# Patient Record
Sex: Female | Born: 1950 | Race: White | Hispanic: No | Smoking: Former smoker
Health system: Southern US, Community
[De-identification: ages and names within clinical notes are randomized; demographics above are authoritative.]

## PROBLEM LIST (undated history)

## (undated) DIAGNOSIS — I10 Essential (primary) hypertension: Secondary | ICD-10-CM

## (undated) DIAGNOSIS — E119 Type 2 diabetes mellitus without complications: Secondary | ICD-10-CM

## (undated) DIAGNOSIS — I4819 Other persistent atrial fibrillation: Secondary | ICD-10-CM

## (undated) DIAGNOSIS — E785 Hyperlipidemia, unspecified: Secondary | ICD-10-CM

## (undated) DIAGNOSIS — I2510 Atherosclerotic heart disease of native coronary artery without angina pectoris: Secondary | ICD-10-CM

## (undated) DIAGNOSIS — I779 Disorder of arteries and arterioles, unspecified: Secondary | ICD-10-CM

## (undated) DIAGNOSIS — I639 Cerebral infarction, unspecified: Secondary | ICD-10-CM

## (undated) DIAGNOSIS — I5020 Unspecified systolic (congestive) heart failure: Secondary | ICD-10-CM

## (undated) DIAGNOSIS — I739 Peripheral vascular disease, unspecified: Secondary | ICD-10-CM

## (undated) DIAGNOSIS — N1832 Chronic kidney disease, stage 3b: Secondary | ICD-10-CM

## (undated) DIAGNOSIS — I255 Ischemic cardiomyopathy: Secondary | ICD-10-CM

## (undated) DIAGNOSIS — D649 Anemia, unspecified: Secondary | ICD-10-CM

## (undated) HISTORY — DX: Atherosclerotic heart disease of native coronary artery without angina pectoris: I25.10

## (undated) HISTORY — DX: Anemia, unspecified: D64.9

## (undated) HISTORY — DX: Ischemic cardiomyopathy: I25.5

## (undated) HISTORY — PX: CAROTID ENDARTERECTOMY: SUR193

## (undated) HISTORY — PX: OTHER SURGICAL HISTORY: SHX169

## (undated) HISTORY — DX: Unspecified systolic (congestive) heart failure: I50.20

## (undated) HISTORY — DX: Hyperlipidemia, unspecified: E78.5

## (undated) HISTORY — PX: TONSILLECTOMY: SUR1361

## (undated) HISTORY — PX: CHOLECYSTECTOMY: SHX55

## (undated) HISTORY — DX: Essential (primary) hypertension: I10

## (undated) HISTORY — PX: LEG SURGERY: SHX1003

## (undated) HISTORY — DX: Other persistent atrial fibrillation: I48.19

## (undated) HISTORY — DX: Type 2 diabetes mellitus without complications: E11.9

## (undated) HISTORY — DX: Cerebral infarction, unspecified: I63.9

---

## 2010-12-13 ENCOUNTER — Inpatient Hospital Stay: Payer: Self-pay | Admitting: Podiatry

## 2010-12-30 ENCOUNTER — Ambulatory Visit: Payer: Self-pay | Admitting: Podiatry

## 2011-02-21 ENCOUNTER — Ambulatory Visit: Payer: Self-pay | Admitting: Family Medicine

## 2011-03-11 ENCOUNTER — Ambulatory Visit: Payer: Self-pay | Admitting: Family Medicine

## 2011-04-11 DIAGNOSIS — I639 Cerebral infarction, unspecified: Secondary | ICD-10-CM

## 2011-04-11 HISTORY — DX: Cerebral infarction, unspecified: I63.9

## 2011-04-14 ENCOUNTER — Ambulatory Visit: Payer: Self-pay | Admitting: Family Medicine

## 2011-05-12 ENCOUNTER — Ambulatory Visit: Payer: Self-pay | Admitting: Family Medicine

## 2012-02-13 ENCOUNTER — Inpatient Hospital Stay: Payer: Self-pay | Admitting: Internal Medicine

## 2012-02-13 DIAGNOSIS — I059 Rheumatic mitral valve disease, unspecified: Secondary | ICD-10-CM

## 2012-02-13 LAB — URINALYSIS, COMPLETE
Bilirubin,UR: NEGATIVE
Blood: NEGATIVE
Glucose,UR: >=500 mg/dL (ref 0–75)
Hyaline Cast: 1
Ketone: NEGATIVE
Nitrite: NEGATIVE
Ph: 5 (ref 4.5–8.0)
Specific Gravity: 1.021 (ref 1.003–1.030)
Squamous Epithelial: 10

## 2012-02-13 LAB — COMPREHENSIVE METABOLIC PANEL
Anion Gap: 9 (ref 7–16)
BUN: 20 mg/dL — ABNORMAL HIGH (ref 7–18)
Calcium, Total: 9.7 mg/dL (ref 8.5–10.1)
Chloride: 104 mmol/L (ref 98–107)
Co2: 26 mmol/L (ref 21–32)
Creatinine: 0.83 mg/dL (ref 0.60–1.30)
EGFR (African American): >60
Osmolality: 291 (ref 275–301)
Potassium: 3.8 mmol/L (ref 3.5–5.1)
SGOT(AST): 34 U/L (ref 15–37)
SGPT (ALT): 40 U/L (ref 12–78)
Total Protein: 9.8 g/dL — ABNORMAL HIGH (ref 6.4–8.2)

## 2012-02-13 LAB — CBC
HGB: 14.9 g/dL (ref 12.0–16.0)
MCH: 30.8 pg (ref 26.0–34.0)
MCHC: 33.8 g/dL (ref 32.0–36.0)
MCV: 91 fL (ref 80–100)
Platelet: 260 10*3/uL (ref 150–440)

## 2012-02-13 LAB — APTT: Activated PTT: 29.6 secs (ref 23.6–35.9)

## 2012-02-13 LAB — PROTIME-INR
INR: 0.9
Prothrombin Time: 12.9 secs (ref 11.5–14.7)

## 2012-02-14 LAB — CBC WITH DIFFERENTIAL/PLATELET
Basophil %: 1 %
Eosinophil #: 0.4 10*3/uL (ref 0.0–0.7)
Eosinophil %: 4.4 %
HGB: 12.8 g/dL (ref 12.0–16.0)
Lymphocyte %: 17.2 %
MCH: 30.7 pg (ref 26.0–34.0)
Monocyte #: 0.9 x10 3/mm (ref 0.2–0.9)
Monocyte %: 9.7 %
Neutrophil %: 67.7 %
RBC: 4.16 10*6/uL (ref 3.80–5.20)

## 2012-02-14 LAB — LIPID PANEL
HDL Cholesterol: 31 mg/dL — ABNORMAL LOW (ref 40–60)
Ldl Cholesterol, Calc: 100 mg/dL (ref 0–100)
Triglycerides: 168 mg/dL (ref 0–200)
VLDL Cholesterol, Calc: 34 mg/dL (ref 5–40)

## 2012-02-14 LAB — BASIC METABOLIC PANEL
Calcium, Total: 9 mg/dL (ref 8.5–10.1)
Chloride: 106 mmol/L (ref 98–107)
Co2: 25 mmol/L (ref 21–32)
EGFR (African American): >60
EGFR (Non-African Amer.): >60
Glucose: 119 mg/dL — ABNORMAL HIGH (ref 65–99)
Osmolality: 285 (ref 275–301)
Potassium: 4 mmol/L (ref 3.5–5.1)
Sodium: 141 mmol/L (ref 136–145)

## 2012-02-15 DIAGNOSIS — R9389 Abnormal findings on diagnostic imaging of other specified body structures: Secondary | ICD-10-CM

## 2012-02-15 DIAGNOSIS — R9431 Abnormal electrocardiogram [ECG] [EKG]: Secondary | ICD-10-CM

## 2012-02-15 LAB — HEMOGLOBIN A1C: Hemoglobin A1C: 7.2 % — ABNORMAL HIGH (ref 4.2–6.3)

## 2012-02-16 LAB — URINE CULTURE

## 2012-02-28 ENCOUNTER — Ambulatory Visit: Payer: Self-pay | Admitting: Vascular Surgery

## 2012-02-28 ENCOUNTER — Encounter: Payer: Self-pay | Admitting: *Deleted

## 2012-02-28 LAB — CBC
MCV: 90 fL (ref 80–100)
Platelet: 292 10*3/uL (ref 150–440)
RBC: 4.45 10*6/uL (ref 3.80–5.20)
RDW: 13.4 % (ref 11.5–14.5)
WBC: 11.4 10*3/uL — ABNORMAL HIGH (ref 3.6–11.0)

## 2012-02-28 LAB — BASIC METABOLIC PANEL
BUN: 25 mg/dL — ABNORMAL HIGH (ref 7–18)
Calcium, Total: 9.2 mg/dL (ref 8.5–10.1)
Creatinine: 0.87 mg/dL (ref 0.60–1.30)
EGFR (African American): >60
EGFR (Non-African Amer.): >60
Glucose: 86 mg/dL (ref 65–99)
Potassium: 4.4 mmol/L (ref 3.5–5.1)
Sodium: 135 mmol/L — ABNORMAL LOW (ref 136–145)

## 2012-02-29 ENCOUNTER — Encounter: Payer: Self-pay | Admitting: *Deleted

## 2012-03-01 ENCOUNTER — Encounter: Payer: Self-pay | Admitting: Cardiovascular Disease

## 2012-03-01 ENCOUNTER — Ambulatory Visit (INDEPENDENT_AMBULATORY_CARE_PROVIDER_SITE_OTHER): Payer: Self-pay | Admitting: Cardiovascular Disease

## 2012-03-01 VITALS — BP 140/90 | HR 80 | Ht 68.0 in | Wt 231.5 lb

## 2012-03-01 DIAGNOSIS — R0600 Dyspnea, unspecified: Secondary | ICD-10-CM | POA: Insufficient documentation

## 2012-03-01 DIAGNOSIS — R0609 Other forms of dyspnea: Secondary | ICD-10-CM

## 2012-03-01 DIAGNOSIS — Z01810 Encounter for preprocedural cardiovascular examination: Secondary | ICD-10-CM

## 2012-03-01 DIAGNOSIS — I10 Essential (primary) hypertension: Secondary | ICD-10-CM

## 2012-03-01 NOTE — Patient Instructions (Addendum)
Your physician has requested that you have a lexiscan myoview. For further information please visit [email or web address]. Please follow instruction sheet, as given.   

## 2012-03-01 NOTE — Progress Notes (Addendum)
HPI  This is a 61 year old female who was referred by Dr. Lucky Cowboy for preoperative cardiovascular evaluation before left carotid endarterectomy which is scheduled next week on Wednesday. I saw the patient recently at [facility]. She presented early this month with acute ischemic stroke greater than 3 days in duration affecting the left frontal lobe. Further evaluation revealed 75% stenosis in the left internal carotid artery. She had an echocardiogram done which showed mildly decreased LV systolic function with an ejection fraction of 45% with severe hypokinesis of the distal anterior wall and apex without LV thrombus. The patient is not aware of any previous cardiac history. She has known history of type 2 diabetes, hypertension and hyperlipidemia. She reports no chest pain. She has noted increased exertional dyspnea lately especially after her stroke. She still has mild residual right-sided weakness but seems to be improving overall.  No Known Allergies   Current Outpatient Prescriptions on File Prior to Visit  Medication Sig Dispense Refill  . glipiZIDE (GLUCOTROL) 5 MG tablet Take 5 mg by mouth 2 (two) times daily.      Marland Kitchen lisinopril (PRINIVIL,ZESTRIL) 10 MG tablet Take 10 mg by mouth daily.      . metFORMIN (GLUCOPHAGE) 1000 MG tablet Take 1,000 mg by mouth 2 (two) times daily.      . pravastatin (PRAVACHOL) 20 MG tablet Take 20 mg by mouth daily.         Past Medical History  Diagnosis Date  . Hypertension   . Diabetes mellitus without complication     Type II  . Hyperlipidemia   . Stroke 2013     Past Surgical History  Procedure Date  . Leg surgery     right;infection     Family History  Problem Relation Age of Onset  . Heart disease Mother      History   Social History  . Marital Status: Widowed    Spouse Name: N/A    Number of Children: N/A  . Years of Education: N/A   Occupational History  . Not on file.   Social History Main Topics  . Smoking status: Former  Smoker -- 0.5 packs/day for 5 years    Types: Cigarettes  . Smokeless tobacco: Not on file  . Alcohol Use: No  . Drug Use: No  . Sexually Active:    Other Topics Concern  . Not on file   Social History Narrative  . No narrative on file     PHYSICAL EXAM   BP 140/90  Pulse 80  Ht 5\' 8"  (1.727 m)  Wt 231 lb 8 oz (105.008 kg)  BMI 35.20 kg/m2 Constitutional: She is oriented to person, place, and time. She appears well-developed and well-nourished. No distress.  HENT: No nasal discharge.  Head: Normocephalic and atraumatic.  Eyes: Pupils are equal and round. Right eye exhibits no discharge. Left eye exhibits no discharge.  Neck: Normal range of motion. Neck supple. No JVD present. No thyromegaly present. Left carotid bruit. Cardiovascular: Normal rate, regular rhythm, normal heart sounds. Exam reveals no gallop and no friction rub. No murmur heard.  Pulmonary/Chest: Effort normal and breath sounds normal. No stridor. No respiratory distress. She has no wheezes. She has no rales. She exhibits no tenderness.  Abdominal: Soft. Bowel sounds are normal. She exhibits no distension. There is no tenderness. There is no rebound and no guarding.  Musculoskeletal: Normal range of motion. She exhibits no edema and no tenderness.  Neurological: She is alert and oriented to  person, place, and time. Coordination normal.  Skin: Skin is warm and dry. No rash noted. She is not diaphoretic. No erythema. No pallor.  Psychiatric: She has a normal mood and affect. Her behavior is normal. Judgment and thought content normal.     EKG: Sinus  Rhythm  Old septal infarct.  ABNORMAL    ASSESSMENT AND PLAN

## 2012-03-01 NOTE — Assessment & Plan Note (Addendum)
The patient is scheduled for left carotid endarterectomy next week. Her recent echocardiogram during her hospitalization was suggestive of a prior anteroapical infarct with mildly reduced LV systolic function. Current symptoms include dyspnea without chest pain. The possibilities include old LAD infarct versus stress-induced cardiomyopathy. I recommend proceeding with a pharmacologic nuclear stress test for further evaluation. The patient is not able to exercise on a treadmill due to her recent stroke. In the meantime, continue current medications. I will consider adding a small dose beta blocker. Further recommendations to follow after stress test which will be scheduled on Monday. The patient overall does not seem to have convincing evidence of angina. I suspect that most likely she will be able to proceed with the surgery at an overall low to moderate risk.  Addendum 03/04/2012: Myocardial perfusion imaging confirmed the presence of prior anteroseptal, anterior and apical infarct with only mild peri-infarct ischemia and ejection fraction of 41%.  Given that the patient's presentation was that of a stroke, I think the priority is to treat her left carotid stenosis and then address the cardiac issues. She should be considered at an overall moderate risk for cardiovascular complications. She reports no significant cardiac symptoms. I will start her on metoprolol 25 mg twice daily. She will require cardiac catheterization in the near future.

## 2012-03-04 ENCOUNTER — Telehealth: Payer: Self-pay

## 2012-03-04 ENCOUNTER — Ambulatory Visit: Payer: Self-pay | Admitting: Cardiovascular Disease

## 2012-03-04 DIAGNOSIS — R079 Chest pain, unspecified: Secondary | ICD-10-CM

## 2012-03-04 MED ORDER — METOPROLOL TARTRATE 25 MG PO TABS: 25.0000 mg | ORAL_TABLET | Freq: Two times a day (BID) | ORAL | Status: DC

## 2012-03-04 NOTE — Telephone Encounter (Signed)
Pt informed Understanding verb Letter faxed to surg Pt will pick up new RX

## 2012-03-04 NOTE — Telephone Encounter (Signed)
Message copied by Minus Liberty on Mon Mar 04, 2012  2:17 PM ------      Message from: Kathlyn Sacramento A      Created: Mon Mar 04, 2012  2:10 PM      Regarding: stress test       Her stress test confirmed previous MI. EF :41%. I want her to start taking Metoprolol 25 mg bid (I sent an Rx).       She can proceed with surgery at moderate risk (I updated her note).       Schedule her for a follow up visit with me in 1 month. She will likely require cardiac cath at that time.

## 2012-03-04 NOTE — Addendum Note (Signed)
Addended by: Kathlyn Sacramento A on: 03/04/2012 02:10 PM   Modules accepted: Orders

## 2012-03-06 ENCOUNTER — Inpatient Hospital Stay: Payer: Self-pay | Admitting: Vascular Surgery

## 2012-03-07 LAB — APTT: Activated PTT: 26.6 secs (ref 23.6–35.9)

## 2012-03-07 LAB — CBC WITH DIFFERENTIAL/PLATELET
Basophil %: 0.8 %
Eosinophil #: 0.3 10*3/uL (ref 0.0–0.7)
HCT: 31.4 % — ABNORMAL LOW (ref 35.0–47.0)
HGB: 11 g/dL — ABNORMAL LOW (ref 12.0–16.0)
Lymphocyte #: 1.3 10*3/uL (ref 1.0–3.6)
Lymphocyte %: 14.2 %
MCH: 31.3 pg (ref 26.0–34.0)
MCHC: 35 g/dL (ref 32.0–36.0)
MCV: 90 fL (ref 80–100)
Neutrophil #: 6.5 10*3/uL (ref 1.4–6.5)
RBC: 3.51 10*6/uL — ABNORMAL LOW (ref 3.80–5.20)

## 2012-03-07 LAB — PROTIME-INR
INR: 1
Prothrombin Time: 13.1 secs (ref 11.5–14.7)

## 2012-03-07 LAB — BASIC METABOLIC PANEL
Anion Gap: 6 — ABNORMAL LOW (ref 7–16)
BUN: 9 mg/dL (ref 7–18)
Co2: 26 mmol/L (ref 21–32)
Creatinine: 0.43 mg/dL — ABNORMAL LOW (ref 0.60–1.30)
EGFR (African American): >60
Potassium: 4 mmol/L (ref 3.5–5.1)
Sodium: 141 mmol/L (ref 136–145)

## 2012-03-08 LAB — PATHOLOGY REPORT

## 2012-03-19 ENCOUNTER — Other Ambulatory Visit: Payer: Self-pay | Admitting: *Deleted

## 2012-03-19 DIAGNOSIS — R0600 Dyspnea, unspecified: Secondary | ICD-10-CM

## 2012-03-26 ENCOUNTER — Ambulatory Visit: Payer: Self-pay | Admitting: Cardiovascular Disease

## 2012-04-12 ENCOUNTER — Ambulatory Visit (INDEPENDENT_AMBULATORY_CARE_PROVIDER_SITE_OTHER): Payer: Self-pay | Admitting: Cardiovascular Disease

## 2012-04-12 ENCOUNTER — Encounter: Payer: Self-pay | Admitting: Cardiovascular Disease

## 2012-04-12 VITALS — BP 102/68 | HR 78 | Ht 68.0 in | Wt 234.0 lb

## 2012-04-12 DIAGNOSIS — I5022 Chronic systolic (congestive) heart failure: Secondary | ICD-10-CM

## 2012-04-12 DIAGNOSIS — I2510 Atherosclerotic heart disease of native coronary artery without angina pectoris: Secondary | ICD-10-CM

## 2012-04-12 NOTE — Assessment & Plan Note (Signed)
This is likely due to ischemic cardiomyopathy. Ejection fraction was 41%. She is currently New York Heart Association class II. Continue treatment with lisinopril and metoprolol. There is no evidence of fluid overload.

## 2012-04-12 NOTE — Assessment & Plan Note (Signed)
The patient likely has underlying coronary artery disease with previous silent anterior myocardial infarction. She is currently not having symptoms suggestive of angina. I discussed with her the option of proceeding with cardiac catheterization to confirm this. However, given the lack of significant symptoms, she prefers to be treated medically at this time.

## 2012-04-12 NOTE — Patient Instructions (Addendum)
Continue same medications.  Follow up in 6 months.  

## 2012-04-12 NOTE — Progress Notes (Signed)
HPI  This is a 62 year old female who is here today for a followup visit regarding cardiomyopathy and presumed underlying coronary artery disease. She was hospitalized a few months ago for acute ischemic stroke. Further evaluation revealed 75% stenosis in the left internal carotid artery. She had an echocardiogram done which showed mildly decreased LV systolic function with an ejection fraction of 45% with severe hypokinesis of the distal anterior wall and apex without LV thrombus.  She has known history of type 2 diabetes, hypertension and hyperlipidemia.  Isolar before a planned carotid endarterectomy. She underwent a nuclear stress test which showed evidence of prior anterior infarct without significant ischemia. Ejection fraction was 41%. The patient underwent endarterectomy without complications. She continues to deny any chest pain. She has chronic mild exertional dyspnea without orthopnea, PND or lower extremity edema.   No Known Allergies   Current Outpatient Prescriptions on File Prior to Visit  Medication Sig Dispense Refill  . aspirin 325 MG tablet Take 325 mg by mouth daily.      Marland Kitchen glipiZIDE (GLUCOTROL) 5 MG tablet Take 5 mg by mouth 2 (two) times daily.      Marland Kitchen lisinopril (PRINIVIL,ZESTRIL) 10 MG tablet Take 10 mg by mouth daily.      . metFORMIN (GLUCOPHAGE) 1000 MG tablet Take 1,000 mg by mouth 2 (two) times daily.      . metoprolol tartrate (LOPRESSOR) 25 MG tablet Take 1 tablet (25 mg total) by mouth 2 (two) times daily.  60 tablet  3  . Omega-3 Fatty Acids (FISH OIL) 1200 MG CAPS Take by mouth daily.      . pravastatin (PRAVACHOL) 20 MG tablet Take 20 mg by mouth daily.         Past Medical History  Diagnosis Date  . Hypertension   . Diabetes mellitus without complication     Type II  . Hyperlipidemia   . Stroke 2013     Past Surgical History  Procedure Date  . Leg surgery     right;infection  . Carotid endarterectomy     [facility]; Dr. Lucky Cowboy     Family History    Problem Relation Age of Onset  . Heart disease Mother      History   Social History  . Marital Status: Widowed    Spouse Name: N/A    Number of Children: N/A  . Years of Education: N/A   Occupational History  . Not on file.   Social History Main Topics  . Smoking status: Former Smoker -- 0.5 packs/day for 5 years    Types: Cigarettes  . Smokeless tobacco: Not on file  . Alcohol Use: No  . Drug Use: No  . Sexually Active:    Other Topics Concern  . Not on file   Social History Narrative  . No narrative on file     PHYSICAL EXAM   BP 102/68  Pulse 78  Ht 5\' 8"  (1.727 m)  Wt 234 lb (106.142 kg)  BMI 35.58 kg/m2 Constitutional: She is oriented to person, place, and time. She appears well-developed and well-nourished. No distress.  HENT: No nasal discharge.  Head: Normocephalic and atraumatic.  Eyes: Pupils are equal and round. Right eye exhibits no discharge. Left eye exhibits no discharge.  Neck: Normal range of motion. Neck supple. No JVD present. No thyromegaly present. Left carotid bruit. Cardiovascular: Normal rate, regular rhythm, normal heart sounds. Exam reveals no gallop and no friction rub. No murmur heard.  Pulmonary/Chest: Effort normal  and breath sounds normal. No stridor. No respiratory distress. She has no wheezes. She has no rales. She exhibits no tenderness.  Abdominal: Soft. Bowel sounds are normal. She exhibits no distension. There is no tenderness. There is no rebound and no guarding.  Musculoskeletal: Normal range of motion. She exhibits no edema and no tenderness.  Neurological: She is alert and oriented to person, place, and time. Coordination normal.  Skin: Skin is warm and dry. No rash noted. She is not diaphoretic. No erythema. No pallor.  Psychiatric: She has a normal mood and affect. Her behavior is normal. Judgment and thought content normal.      ASSESSMENT AND PLAN

## 2012-06-25 ENCOUNTER — Other Ambulatory Visit: Payer: Self-pay | Admitting: Cardiovascular Disease

## 2012-07-30 ENCOUNTER — Other Ambulatory Visit: Payer: Self-pay | Admitting: Cardiovascular Disease

## 2012-08-21 ENCOUNTER — Other Ambulatory Visit: Payer: Self-pay | Admitting: Cardiovascular Disease

## 2012-10-09 ENCOUNTER — Other Ambulatory Visit: Payer: Self-pay | Admitting: Cardiovascular Disease

## 2012-12-26 ENCOUNTER — Ambulatory Visit: Payer: Self-pay | Admitting: Cardiovascular Disease

## 2012-12-26 ENCOUNTER — Encounter: Payer: Self-pay | Admitting: *Deleted

## 2013-06-28 LAB — CBC WITH DIFFERENTIAL/PLATELET
BASOS PCT: 0.6 %
Basophil #: 0.1 10*3/uL (ref 0.0–0.1)
EOS PCT: 0.3 %
Eosinophil #: 0.1 10*3/uL (ref 0.0–0.7)
HCT: 42 % (ref 35.0–47.0)
HGB: 14 g/dL (ref 12.0–16.0)
LYMPHS ABS: 0.6 10*3/uL — AB (ref 1.0–3.6)
Lymphocyte %: 3.6 %
MCH: 29.7 pg (ref 26.0–34.0)
MCHC: 33.3 g/dL (ref 32.0–36.0)
MCV: 89 fL (ref 80–100)
MONO ABS: 1 x10 3/mm — AB (ref 0.2–0.9)
Monocyte %: 5.9 %
Neutrophil #: 14.7 10*3/uL — ABNORMAL HIGH (ref 1.4–6.5)
Neutrophil %: 89.6 %
PLATELETS: 210 10*3/uL (ref 150–440)
RBC: 4.71 10*6/uL (ref 3.80–5.20)
RDW: 13.6 % (ref 11.5–14.5)
WBC: 16.5 10*3/uL — ABNORMAL HIGH (ref 3.6–11.0)

## 2013-06-28 LAB — COMPREHENSIVE METABOLIC PANEL
Albumin: 2.8 g/dL — ABNORMAL LOW (ref 3.4–5.0)
Alkaline Phosphatase: 107 U/L
Anion Gap: 6 — ABNORMAL LOW (ref 7–16)
BILIRUBIN TOTAL: 1 mg/dL (ref 0.2–1.0)
BUN: 21 mg/dL — AB (ref 7–18)
CALCIUM: 8.9 mg/dL (ref 8.5–10.1)
CO2: 25 mmol/L (ref 21–32)
Chloride: 95 mmol/L — ABNORMAL LOW (ref 98–107)
Creatinine: 0.86 mg/dL (ref 0.60–1.30)
EGFR (African American): >60
Glucose: 374 mg/dL — ABNORMAL HIGH (ref 65–99)
OSMOLALITY: 272 (ref 275–301)
Potassium: 3.8 mmol/L (ref 3.5–5.1)
SGOT(AST): 22 U/L (ref 15–37)
SGPT (ALT): 23 U/L (ref 12–78)
Sodium: 126 mmol/L — ABNORMAL LOW (ref 136–145)
Total Protein: 8.9 g/dL — ABNORMAL HIGH (ref 6.4–8.2)

## 2013-06-28 LAB — LIPASE, BLOOD: Lipase: 88 U/L (ref 73–393)

## 2013-06-28 LAB — TROPONIN I: TROPONIN-I: 0.04 ng/mL

## 2013-06-29 ENCOUNTER — Inpatient Hospital Stay: Payer: Self-pay | Admitting: Internal Medicine

## 2013-06-29 DIAGNOSIS — R1110 Vomiting, unspecified: Secondary | ICD-10-CM | POA: Insufficient documentation

## 2013-06-29 DIAGNOSIS — R112 Nausea with vomiting, unspecified: Secondary | ICD-10-CM | POA: Insufficient documentation

## 2013-06-29 LAB — URINALYSIS, COMPLETE
Bilirubin,UR: NEGATIVE
Glucose,UR: >=500 mg/dL (ref 0–75)
Nitrite: NEGATIVE
PH: 5 (ref 4.5–8.0)
Protein: 30
RBC,UR: 23 /HPF (ref 0–5)
Specific Gravity: 1.03 (ref 1.003–1.030)
WBC UR: 46 /HPF (ref 0–5)

## 2013-06-30 DIAGNOSIS — I4891 Unspecified atrial fibrillation: Secondary | ICD-10-CM

## 2013-06-30 DIAGNOSIS — I2589 Other forms of chronic ischemic heart disease: Secondary | ICD-10-CM

## 2013-06-30 LAB — CBC WITH DIFFERENTIAL/PLATELET
BASOS ABS: 0.1 10*3/uL (ref 0.0–0.1)
BASOS PCT: 0.4 %
BASOS PCT: 0.5 %
Basophil #: 0 10*3/uL (ref 0.0–0.1)
EOS PCT: 1.1 %
Eosinophil #: 0.1 10*3/uL (ref 0.0–0.7)
Eosinophil #: 0.1 10*3/uL (ref 0.0–0.7)
Eosinophil %: 0.7 %
HCT: 34.9 % — AB (ref 35.0–47.0)
HCT: 36.9 % (ref 35.0–47.0)
HGB: 11.9 g/dL — ABNORMAL LOW (ref 12.0–16.0)
HGB: 12.5 g/dL (ref 12.0–16.0)
LYMPHS ABS: 1.1 10*3/uL (ref 1.0–3.6)
Lymphocyte #: 1.2 10*3/uL (ref 1.0–3.6)
Lymphocyte %: 12.8 %
Lymphocyte %: 10.7 %
MCH: 29.9 pg (ref 26.0–34.0)
MCH: 30.1 pg (ref 26.0–34.0)
MCHC: 34 g/dL (ref 32.0–36.0)
MCHC: 33.9 g/dL (ref 32.0–36.0)
MCV: 88 fL (ref 80–100)
MCV: 89 fL (ref 80–100)
MONO ABS: 1.1 x10 3/mm — AB (ref 0.2–0.9)
MONOS PCT: 8.7 %
Monocyte #: 0.9 x10 3/mm (ref 0.2–0.9)
Monocyte %: 11.4 %
NEUTROS ABS: 7.2 10*3/uL — AB (ref 1.4–6.5)
NEUTROS PCT: 74.3 %
NEUTROS PCT: 79.4 %
Neutrophil #: 8 10*3/uL — ABNORMAL HIGH (ref 1.4–6.5)
Platelet: 197 10*3/uL (ref 150–440)
Platelet: 205 10*3/uL (ref 150–440)
RBC: 3.97 10*6/uL (ref 3.80–5.20)
RBC: 4.15 10*6/uL (ref 3.80–5.20)
RDW: 13.6 % (ref 11.5–14.5)
RDW: 13.4 % (ref 11.5–14.5)
WBC: 9.7 10*3/uL (ref 3.6–11.0)
WBC: 10.1 10*3/uL (ref 3.6–11.0)

## 2013-06-30 LAB — BASIC METABOLIC PANEL
Anion Gap: 6 — ABNORMAL LOW (ref 7–16)
BUN: 7 mg/dL (ref 7–18)
CO2: 27 mmol/L (ref 21–32)
Calcium, Total: 7.8 mg/dL — ABNORMAL LOW (ref 8.5–10.1)
Chloride: 101 mmol/L (ref 98–107)
Creatinine: 0.59 mg/dL — ABNORMAL LOW (ref 0.60–1.30)
EGFR (African American): >60
EGFR (Non-African Amer.): >60
Glucose: 101 mg/dL — ABNORMAL HIGH (ref 65–99)
OSMOLALITY: 266 (ref 275–301)
POTASSIUM: 3 mmol/L — AB (ref 3.5–5.1)
SODIUM: 134 mmol/L — AB (ref 136–145)

## 2013-06-30 LAB — APTT: Activated PTT: 32.4 secs (ref 23.6–35.9)

## 2013-06-30 LAB — URINE CULTURE

## 2013-06-30 LAB — MAGNESIUM: MAGNESIUM: 1.6 mg/dL — AB

## 2013-07-01 DIAGNOSIS — I059 Rheumatic mitral valve disease, unspecified: Secondary | ICD-10-CM

## 2013-07-01 LAB — COMPREHENSIVE METABOLIC PANEL
ALBUMIN: 2 g/dL — AB (ref 3.4–5.0)
ANION GAP: 6 — AB (ref 7–16)
AST: 24 U/L (ref 15–37)
Alkaline Phosphatase: 77 U/L
BUN: 13 mg/dL (ref 7–18)
Bilirubin,Total: 0.6 mg/dL (ref 0.2–1.0)
CALCIUM: 7.6 mg/dL — AB (ref 8.5–10.1)
CHLORIDE: 103 mmol/L (ref 98–107)
Co2: 24 mmol/L (ref 21–32)
Creatinine: 0.78 mg/dL (ref 0.60–1.30)
EGFR (African American): >60
EGFR (Non-African Amer.): >60
Glucose: 240 mg/dL — ABNORMAL HIGH (ref 65–99)
Osmolality: 274 (ref 275–301)
Potassium: 3.8 mmol/L (ref 3.5–5.1)
SGPT (ALT): 21 U/L (ref 12–78)
Sodium: 133 mmol/L — ABNORMAL LOW (ref 136–145)
TOTAL PROTEIN: 7.2 g/dL (ref 6.4–8.2)

## 2013-07-01 LAB — CBC WITH DIFFERENTIAL/PLATELET
Basophil #: 0.1 10*3/uL (ref 0.0–0.1)
Basophil %: 0.5 %
EOS PCT: 0.9 %
Eosinophil #: 0.1 10*3/uL (ref 0.0–0.7)
HCT: 34.8 % — AB (ref 35.0–47.0)
HGB: 12 g/dL (ref 12.0–16.0)
LYMPHS ABS: 0.9 10*3/uL — AB (ref 1.0–3.6)
Lymphocyte %: 8 %
MCH: 30.5 pg (ref 26.0–34.0)
MCHC: 34.4 g/dL (ref 32.0–36.0)
MCV: 89 fL (ref 80–100)
Monocyte #: 1 x10 3/mm — ABNORMAL HIGH (ref 0.2–0.9)
Monocyte %: 9.2 %
Neutrophil #: 8.7 10*3/uL — ABNORMAL HIGH (ref 1.4–6.5)
Neutrophil %: 81.4 %
Platelet: 197 10*3/uL (ref 150–440)
RBC: 3.92 10*6/uL (ref 3.80–5.20)
RDW: 13.7 % (ref 11.5–14.5)
WBC: 10.8 10*3/uL (ref 3.6–11.0)

## 2013-07-01 LAB — APTT
Activated PTT: 87 secs — ABNORMAL HIGH (ref 23.6–35.9)
Activated PTT: 74.5 secs — ABNORMAL HIGH (ref 23.6–35.9)

## 2013-07-01 LAB — MAGNESIUM: Magnesium: 2.4 mg/dL

## 2013-07-02 ENCOUNTER — Encounter: Payer: Self-pay | Admitting: Cardiovascular Disease

## 2013-07-02 DIAGNOSIS — I4891 Unspecified atrial fibrillation: Secondary | ICD-10-CM

## 2013-07-02 DIAGNOSIS — I2589 Other forms of chronic ischemic heart disease: Secondary | ICD-10-CM

## 2013-07-02 LAB — MAGNESIUM: MAGNESIUM: 2.1 mg/dL

## 2013-07-02 LAB — APTT
ACTIVATED PTT: 53.9 s — AB (ref 23.6–35.9)
Activated PTT: 64.3 secs — ABNORMAL HIGH (ref 23.6–35.9)
Activated PTT: 61.5 secs — ABNORMAL HIGH (ref 23.6–35.9)

## 2013-07-02 LAB — TSH: Thyroid Stimulating Horm: 2.73 u[IU]/mL

## 2013-07-02 LAB — PRO B NATRIURETIC PEPTIDE: B-TYPE NATIURETIC PEPTID: 3926 pg/mL — AB (ref 0–125)

## 2013-07-03 DIAGNOSIS — I2589 Other forms of chronic ischemic heart disease: Secondary | ICD-10-CM

## 2013-07-03 DIAGNOSIS — I4891 Unspecified atrial fibrillation: Secondary | ICD-10-CM

## 2013-07-03 DIAGNOSIS — I5033 Acute on chronic diastolic (congestive) heart failure: Secondary | ICD-10-CM

## 2013-07-03 LAB — BASIC METABOLIC PANEL
Anion Gap: 5 — ABNORMAL LOW (ref 7–16)
BUN: 12 mg/dL (ref 7–18)
CALCIUM: 7.8 mg/dL — AB (ref 8.5–10.1)
CHLORIDE: 101 mmol/L (ref 98–107)
Co2: 24 mmol/L (ref 21–32)
Creatinine: 0.78 mg/dL (ref 0.60–1.30)
EGFR (African American): >60
EGFR (Non-African Amer.): >60
GLUCOSE: 263 mg/dL — AB (ref 65–99)
Osmolality: 270 (ref 275–301)
Potassium: 3.3 mmol/L — ABNORMAL LOW (ref 3.5–5.1)
Sodium: 130 mmol/L — ABNORMAL LOW (ref 136–145)

## 2013-07-03 LAB — HEMOGLOBIN: HGB: 11.6 g/dL — ABNORMAL LOW (ref 12.0–16.0)

## 2013-07-03 LAB — APTT: Activated PTT: 91 secs — ABNORMAL HIGH (ref 23.6–35.9)

## 2013-07-03 LAB — PLATELET COUNT: Platelet: 238 10*3/uL (ref 150–440)

## 2013-07-04 LAB — BASIC METABOLIC PANEL
Anion Gap: 6 — ABNORMAL LOW (ref 7–16)
BUN: 9 mg/dL (ref 7–18)
CALCIUM: 8.1 mg/dL — AB (ref 8.5–10.1)
CO2: 26 mmol/L (ref 21–32)
Chloride: 103 mmol/L (ref 98–107)
Creatinine: 0.73 mg/dL (ref 0.60–1.30)
GLUCOSE: 196 mg/dL — AB (ref 65–99)
Osmolality: 274 (ref 275–301)
Potassium: 4.1 mmol/L (ref 3.5–5.1)
Sodium: 135 mmol/L — ABNORMAL LOW (ref 136–145)

## 2013-07-04 LAB — APTT: ACTIVATED PTT: 58.4 s — AB (ref 23.6–35.9)

## 2013-07-05 LAB — CBC WITH DIFFERENTIAL/PLATELET
BASOS ABS: 0.1 10*3/uL (ref 0.0–0.1)
Basophil %: 0.5 %
EOS PCT: 0.1 %
Eosinophil #: 0 10*3/uL (ref 0.0–0.7)
HCT: 32 % — ABNORMAL LOW (ref 35.0–47.0)
HGB: 10.5 g/dL — ABNORMAL LOW (ref 12.0–16.0)
LYMPHS PCT: 5.8 %
Lymphocyte #: 0.7 10*3/uL — ABNORMAL LOW (ref 1.0–3.6)
MCH: 29.3 pg (ref 26.0–34.0)
MCHC: 32.8 g/dL (ref 32.0–36.0)
MCV: 89 fL (ref 80–100)
MONO ABS: 1 x10 3/mm — AB (ref 0.2–0.9)
Monocyte %: 7.5 %
NEUTROS PCT: 86.1 %
Neutrophil #: 11.1 10*3/uL — ABNORMAL HIGH (ref 1.4–6.5)
PLATELETS: 331 10*3/uL (ref 150–440)
RBC: 3.58 10*6/uL — AB (ref 3.80–5.20)
RDW: 14.1 % (ref 11.5–14.5)
WBC: 12.9 10*3/uL — AB (ref 3.6–11.0)

## 2013-07-05 LAB — COMPREHENSIVE METABOLIC PANEL
ALBUMIN: 1.7 g/dL — AB (ref 3.4–5.0)
Alkaline Phosphatase: 62 U/L
Anion Gap: 6 — ABNORMAL LOW (ref 7–16)
BILIRUBIN TOTAL: 0.5 mg/dL (ref 0.2–1.0)
BUN: 11 mg/dL (ref 7–18)
CALCIUM: 8 mg/dL — AB (ref 8.5–10.1)
CHLORIDE: 104 mmol/L (ref 98–107)
CREATININE: 0.74 mg/dL (ref 0.60–1.30)
Co2: 26 mmol/L (ref 21–32)
Glucose: 224 mg/dL — ABNORMAL HIGH (ref 65–99)
OSMOLALITY: 278 (ref 275–301)
Potassium: 4.3 mmol/L (ref 3.5–5.1)
SGOT(AST): 17 U/L (ref 15–37)
SGPT (ALT): 14 U/L (ref 12–78)
SODIUM: 136 mmol/L (ref 136–145)
TOTAL PROTEIN: 6.8 g/dL (ref 6.4–8.2)

## 2013-07-05 LAB — APTT
Activated PTT: 30.9 secs (ref 23.6–35.9)
Activated PTT: 76.2 secs — ABNORMAL HIGH (ref 23.6–35.9)

## 2013-07-06 LAB — CBC WITH DIFFERENTIAL/PLATELET
BASOS ABS: 0.1 10*3/uL (ref 0.0–0.1)
BASOS PCT: 0.6 %
EOS PCT: 0.2 %
Eosinophil #: 0 10*3/uL (ref 0.0–0.7)
HCT: 31.5 % — ABNORMAL LOW (ref 35.0–47.0)
HGB: 10.2 g/dL — ABNORMAL LOW (ref 12.0–16.0)
Lymphocyte #: 1.2 10*3/uL (ref 1.0–3.6)
Lymphocyte %: 7.4 %
MCH: 29 pg (ref 26.0–34.0)
MCHC: 32.4 g/dL (ref 32.0–36.0)
MCV: 89 fL (ref 80–100)
Monocyte #: 0.9 x10 3/mm (ref 0.2–0.9)
Monocyte %: 5.1 %
NEUTROS ABS: 14.5 10*3/uL — AB (ref 1.4–6.5)
Neutrophil %: 86.7 %
Platelet: 337 10*3/uL (ref 150–440)
RBC: 3.52 10*6/uL — AB (ref 3.80–5.20)
RDW: 14.2 % (ref 11.5–14.5)
WBC: 16.7 10*3/uL — ABNORMAL HIGH (ref 3.6–11.0)

## 2013-07-06 LAB — APTT: Activated PTT: 81.6 secs — ABNORMAL HIGH (ref 23.6–35.9)

## 2013-07-06 LAB — BASIC METABOLIC PANEL
Anion Gap: 6 — ABNORMAL LOW (ref 7–16)
BUN: 11 mg/dL (ref 7–18)
Calcium, Total: 8 mg/dL — ABNORMAL LOW (ref 8.5–10.1)
Chloride: 99 mmol/L (ref 98–107)
Co2: 28 mmol/L (ref 21–32)
Creatinine: 0.75 mg/dL (ref 0.60–1.30)
Glucose: 219 mg/dL — ABNORMAL HIGH (ref 65–99)
OSMOLALITY: 272 (ref 275–301)
Potassium: 3.6 mmol/L (ref 3.5–5.1)
SODIUM: 133 mmol/L — AB (ref 136–145)

## 2013-07-06 LAB — PROTIME-INR
INR: 1.2
PROTHROMBIN TIME: 14.7 s (ref 11.5–14.7)

## 2013-07-07 LAB — CBC WITH DIFFERENTIAL/PLATELET
BASOS PCT: 0.7 %
Basophil #: 0.1 10*3/uL (ref 0.0–0.1)
EOS ABS: 0.1 10*3/uL (ref 0.0–0.7)
Eosinophil %: 0.6 %
HCT: 34 % — AB (ref 35.0–47.0)
HGB: 11.4 g/dL — AB (ref 12.0–16.0)
LYMPHS PCT: 6.9 %
Lymphocyte #: 1.1 10*3/uL (ref 1.0–3.6)
MCH: 29.6 pg (ref 26.0–34.0)
MCHC: 33.6 g/dL (ref 32.0–36.0)
MCV: 88 fL (ref 80–100)
MONO ABS: 0.8 x10 3/mm (ref 0.2–0.9)
MONOS PCT: 4.8 %
NEUTROS ABS: 14.4 10*3/uL — AB (ref 1.4–6.5)
Neutrophil %: 87 %
Platelet: 407 10*3/uL (ref 150–440)
RBC: 3.85 10*6/uL (ref 3.80–5.20)
RDW: 14.1 % (ref 11.5–14.5)
WBC: 16.5 10*3/uL — ABNORMAL HIGH (ref 3.6–11.0)

## 2013-07-07 LAB — PATHOLOGY REPORT

## 2013-07-07 LAB — BASIC METABOLIC PANEL
Anion Gap: 3 — ABNORMAL LOW (ref 7–16)
BUN: 9 mg/dL (ref 7–18)
CALCIUM: 8.1 mg/dL — AB (ref 8.5–10.1)
CO2: 31 mmol/L (ref 21–32)
Chloride: 98 mmol/L (ref 98–107)
Creatinine: 0.74 mg/dL (ref 0.60–1.30)
EGFR (African American): >60
Glucose: 171 mg/dL — ABNORMAL HIGH (ref 65–99)
OSMOLALITY: 267 (ref 275–301)
Potassium: 3.6 mmol/L (ref 3.5–5.1)
SODIUM: 132 mmol/L — AB (ref 136–145)

## 2013-07-07 LAB — APTT
Activated PTT: 58.8 secs — ABNORMAL HIGH (ref 23.6–35.9)
Activated PTT: 75.2 secs — ABNORMAL HIGH (ref 23.6–35.9)

## 2013-07-07 LAB — PROTIME-INR
INR: 1.2
PROTHROMBIN TIME: 14.6 s (ref 11.5–14.7)

## 2013-07-08 DIAGNOSIS — I4891 Unspecified atrial fibrillation: Secondary | ICD-10-CM

## 2013-07-08 LAB — BASIC METABOLIC PANEL
ANION GAP: 5 — AB (ref 7–16)
BUN: 8 mg/dL (ref 7–18)
CALCIUM: 8.2 mg/dL — AB (ref 8.5–10.1)
CHLORIDE: 95 mmol/L — AB (ref 98–107)
Co2: 31 mmol/L (ref 21–32)
Creatinine: 0.87 mg/dL (ref 0.60–1.30)
EGFR (African American): >60
GLUCOSE: 213 mg/dL — AB (ref 65–99)
Osmolality: 267 (ref 275–301)
POTASSIUM: 3.6 mmol/L (ref 3.5–5.1)
SODIUM: 131 mmol/L — AB (ref 136–145)

## 2013-07-08 LAB — CBC WITH DIFFERENTIAL/PLATELET
BANDS NEUTROPHIL: 2 %
HCT: 33.6 % — ABNORMAL LOW (ref 35.0–47.0)
HGB: 11.1 g/dL — ABNORMAL LOW (ref 12.0–16.0)
LYMPHS PCT: 13 %
MCH: 29.2 pg (ref 26.0–34.0)
MCHC: 33.1 g/dL (ref 32.0–36.0)
MCV: 88 fL (ref 80–100)
Metamyelocyte: 1 %
Monocytes: 4 %
Platelet: 440 10*3/uL (ref 150–440)
RBC: 3.8 10*6/uL (ref 3.80–5.20)
RDW: 14.1 % (ref 11.5–14.5)
SEGMENTED NEUTROPHILS: 80 %
WBC: 16.8 10*3/uL — ABNORMAL HIGH (ref 3.6–11.0)

## 2013-07-08 LAB — APTT
Activated PTT: 42.2 secs — ABNORMAL HIGH (ref 23.6–35.9)
Activated PTT: 85.8 secs — ABNORMAL HIGH (ref 23.6–35.9)

## 2013-07-08 LAB — HEPATIC FUNCTION PANEL A (ARMC)
ALBUMIN: 1.7 g/dL — AB (ref 3.4–5.0)
ALK PHOS: 72 U/L
BILIRUBIN DIRECT: 0.1 mg/dL (ref 0.00–0.20)
BILIRUBIN TOTAL: 0.4 mg/dL (ref 0.2–1.0)
SGOT(AST): 12 U/L — ABNORMAL LOW (ref 15–37)
SGPT (ALT): 9 U/L — ABNORMAL LOW (ref 12–78)
Total Protein: 7.5 g/dL (ref 6.4–8.2)

## 2013-07-08 LAB — PROTIME-INR
INR: 1.5
PROTHROMBIN TIME: 18.1 s — AB (ref 11.5–14.7)

## 2013-07-09 DIAGNOSIS — I4891 Unspecified atrial fibrillation: Secondary | ICD-10-CM

## 2013-07-09 LAB — PROTIME-INR
INR: 1.8
Prothrombin Time: 20.5 secs — ABNORMAL HIGH (ref 11.5–14.7)

## 2013-07-09 LAB — BASIC METABOLIC PANEL
Anion Gap: 8 (ref 7–16)
BUN: 5 mg/dL — AB (ref 7–18)
CALCIUM: 7.9 mg/dL — AB (ref 8.5–10.1)
CREATININE: 0.62 mg/dL (ref 0.60–1.30)
Chloride: 97 mmol/L — ABNORMAL LOW (ref 98–107)
Co2: 30 mmol/L (ref 21–32)
EGFR (African American): >60
EGFR (Non-African Amer.): >60
Glucose: 173 mg/dL — ABNORMAL HIGH (ref 65–99)
Osmolality: 271 (ref 275–301)
Potassium: 3.1 mmol/L — ABNORMAL LOW (ref 3.5–5.1)
Sodium: 135 mmol/L — ABNORMAL LOW (ref 136–145)

## 2013-07-09 LAB — CBC WITH DIFFERENTIAL/PLATELET
BANDS NEUTROPHIL: 1 %
COMMENT - H1-COM2: NORMAL
Eosinophil: 1 %
HCT: 31.3 % — AB (ref 35.0–47.0)
HGB: 10.2 g/dL — ABNORMAL LOW (ref 12.0–16.0)
LYMPHS PCT: 9 %
MCH: 28.8 pg (ref 26.0–34.0)
MCHC: 32.7 g/dL (ref 32.0–36.0)
MCV: 88 fL (ref 80–100)
MONOS PCT: 6 %
Metamyelocyte: 1 %
Myelocyte: 2 %
Platelet: 387 10*3/uL (ref 150–440)
RBC: 3.56 10*6/uL — ABNORMAL LOW (ref 3.80–5.20)
RDW: 14 % (ref 11.5–14.5)
SEGMENTED NEUTROPHILS: 80 %
WBC: 12.9 10*3/uL — AB (ref 3.6–11.0)

## 2013-07-09 LAB — APTT
Activated PTT: 116.7 secs — ABNORMAL HIGH (ref 23.6–35.9)
Activated PTT: 153.1 secs — ABNORMAL HIGH (ref 23.6–35.9)

## 2013-07-10 LAB — BASIC METABOLIC PANEL
ANION GAP: 4 — AB (ref 7–16)
BUN: 6 mg/dL — ABNORMAL LOW (ref 7–18)
CHLORIDE: 98 mmol/L (ref 98–107)
CREATININE: 0.65 mg/dL (ref 0.60–1.30)
Calcium, Total: 8.1 mg/dL — ABNORMAL LOW (ref 8.5–10.1)
Co2: 32 mmol/L (ref 21–32)
EGFR (African American): >60
EGFR (Non-African Amer.): >60
GLUCOSE: 146 mg/dL — AB (ref 65–99)
OSMOLALITY: 268 (ref 275–301)
POTASSIUM: 3.6 mmol/L (ref 3.5–5.1)
Sodium: 134 mmol/L — ABNORMAL LOW (ref 136–145)

## 2013-07-10 LAB — MAGNESIUM: Magnesium: 1.5 mg/dL — ABNORMAL LOW

## 2013-07-10 LAB — PROTIME-INR
INR: 2.1
Prothrombin Time: 23.2 secs — ABNORMAL HIGH (ref 11.5–14.7)

## 2013-07-11 DIAGNOSIS — I2589 Other forms of chronic ischemic heart disease: Secondary | ICD-10-CM

## 2013-07-11 DIAGNOSIS — I5043 Acute on chronic combined systolic (congestive) and diastolic (congestive) heart failure: Secondary | ICD-10-CM

## 2013-07-11 LAB — PROTIME-INR
INR: 2.6
PROTHROMBIN TIME: 26.9 s — AB (ref 11.5–14.7)

## 2013-07-15 ENCOUNTER — Telehealth: Payer: Self-pay | Admitting: *Deleted

## 2013-07-15 NOTE — Telephone Encounter (Signed)
Patient contacted regarding discharge from [facility] on 07/11/13.  Patient understands to follow up with provider Tryphena Perkovich [facility] on 07/16/13 at 2:15 pm at [facility]. Patient understands discharge instructions? yes Patient understands medications and regiment? yes Patient understands to bring all medications to this visit? yes

## 2013-07-16 ENCOUNTER — Encounter (INDEPENDENT_AMBULATORY_CARE_PROVIDER_SITE_OTHER): Payer: Self-pay

## 2013-07-16 ENCOUNTER — Ambulatory Visit (INDEPENDENT_AMBULATORY_CARE_PROVIDER_SITE_OTHER): Payer: Medicare Other

## 2013-07-16 ENCOUNTER — Encounter: Payer: Self-pay | Admitting: Physician Assistant

## 2013-07-16 ENCOUNTER — Ambulatory Visit (INDEPENDENT_AMBULATORY_CARE_PROVIDER_SITE_OTHER): Payer: Medicare Other | Admitting: Physician Assistant

## 2013-07-16 VITALS — BP 147/74 | HR 65 | Ht 67.0 in | Wt 217.5 lb

## 2013-07-16 DIAGNOSIS — I5022 Chronic systolic (congestive) heart failure: Secondary | ICD-10-CM

## 2013-07-16 DIAGNOSIS — I4891 Unspecified atrial fibrillation: Secondary | ICD-10-CM

## 2013-07-16 DIAGNOSIS — I480 Paroxysmal atrial fibrillation: Secondary | ICD-10-CM | POA: Insufficient documentation

## 2013-07-16 DIAGNOSIS — I2510 Atherosclerotic heart disease of native coronary artery without angina pectoris: Secondary | ICD-10-CM

## 2013-07-16 DIAGNOSIS — I2589 Other forms of chronic ischemic heart disease: Secondary | ICD-10-CM | POA: Insufficient documentation

## 2013-07-16 DIAGNOSIS — I259 Chronic ischemic heart disease, unspecified: Secondary | ICD-10-CM | POA: Insufficient documentation

## 2013-07-16 DIAGNOSIS — I10 Essential (primary) hypertension: Secondary | ICD-10-CM | POA: Insufficient documentation

## 2013-07-16 DIAGNOSIS — I255 Ischemic cardiomyopathy: Secondary | ICD-10-CM

## 2013-07-16 DIAGNOSIS — I4819 Other persistent atrial fibrillation: Secondary | ICD-10-CM | POA: Insufficient documentation

## 2013-07-16 DIAGNOSIS — Z5181 Encounter for therapeutic drug level monitoring: Secondary | ICD-10-CM

## 2013-07-16 LAB — POCT INR: INR: 1.1

## 2013-07-16 NOTE — Patient Instructions (Signed)
We will arrange follow-up with our Coumadin clinic next week.   Please continue to take amiodarone 200mg  by mouth twice a day until 4/17, then decrease to 200mg  by mouth daily.   Please continue taking all other medications as prescribed.  Your oxygen levels were within normal limits in the office today. You may stop supplemental oxygen.  We will see you back in 1 month.

## 2013-07-16 NOTE — Progress Notes (Signed)
Patient ID: Meghan [name], female   DOB: 10-01-1950, 63 y.o.   MRN: [id number]   Date:  07/16/2013   ID:  Meghan [name], DOB April 15, 1950, MRN [id number]  PCP:  PROVIDER NOT IN SYSTEM  Primary Cardiologist: M. Fletcher Anon, MD   History of Present Illness:  Meghan [name] is a 63 y.o. female w/ PMHx s/f ischemic cardiomyopathy (EF 40%), presumed CAD, DM2, HTN, HLD, carotid artery dz (s/p L CEA), h/o CVA in this setting, obesity and noncompliance due to financial obstacles who was admitted to [facility] 3/22 to 4/3 for acute cholecystitis and new onset a-fib w/ RVR.   She was evaluated in 2013 initially in the setting of acute CVA. A 2D echo was performed revealing EF 45-50% and anterior HK, severe apical HK, impaired LV relaxation, mild LA dilatation noted at that time. She subsequent underwent Lexiscan Myoview confirming prior anterior infarct, no evidence of ischemia. EF 41%. She was treated medically with ASA, BB, statin. She followed up 04/2012. No anginal or CHF type symptoms. Declined outpatient cath.   She presented to [facility] c/o persistent nausea and vomiting x 1 week. Further work-up including HIDA confirmed acute purulent cholecystitis. In this setting, she developed a-fib w/ RVR requiring transfer to CCU for diltiazem and amiodarone infusion and titration for rate-control. She was heparinized until surgery. She was slow to recover and required transition from IV to PO back to IV for a-fib management which prolonged her admission. She was started on Coumadin-- INR 2.6 on 4/3. She did convert to NSR. She was started on PO Lasix due to mild volume overload. Home O2 was arranged for persistent hypoxia. She was then discharged in stable condition.   She feels well today. She continues to take amiodarone 200mg  PO BID, warfarin 1mg  PO daily (not reflected on home meds). She takes Lopressor 25mg  PO BID as previously prescribed. She has had some loose stools today which she attributes to something she ate and recent stress.  Denies abnormal bleeding. EKG as below indicates NSR.   She continues to wear home O2 and is wondering if this is still needed.  EKG: NSR, 61 bpm, diffuse TW blunting, no ST changes  Wt Readings from Last 3 Encounters:  07/16/13 217 lb 8 oz (98.657 kg)  04/12/12 234 lb (106.142 kg)  03/01/12 231 lb 8 oz (105.008 kg)     Past Medical History  Diagnosis Date  . Hypertension   . Diabetes mellitus without complication     Type II  . Hyperlipidemia   . Stroke 2013  . Coronary artery disease     Current Outpatient Prescriptions  Medication Sig Dispense Refill  . amiodarone (PACERONE) 200 MG tablet Take 200 mg by mouth 2 (two) times daily.      Marland Kitchen aspirin 81 MG tablet Take 81 mg by mouth daily.      Marland Kitchen atorvastatin (LIPITOR) 10 MG tablet Take 10 mg by mouth daily.      . furosemide (LASIX) 40 MG tablet Take 40 mg by mouth daily.      Marland Kitchen glipiZIDE (GLUCOTROL) 5 MG tablet Take 5 mg by mouth 2 (two) times daily.      Marland Kitchen lisinopril (PRINIVIL,ZESTRIL) 10 MG tablet Take 10 mg by mouth daily.      . metFORMIN (GLUCOPHAGE) 1000 MG tablet Take 1,000 mg by mouth 2 (two) times daily.      . metoprolol tartrate (LOPRESSOR) 25 MG tablet TAKE ONE TABLET BY MOUTH TWICE DAILY  60 tablet  5  .  warfarin (COUMADIN) 1 MG tablet Take 1 mg by mouth daily.      . Omega-3 Fatty Acids (FISH OIL) 1200 MG CAPS Take by mouth daily.       No current facility-administered medications for this visit.    Allergies:   No Known Allergies  Social History:  The patient  reports that she has quit smoking. Her smoking use included Cigarettes. She has a 2.5 pack-year smoking history. She does not have any smokeless tobacco history on file. She reports that she does not drink alcohol or use illicit drugs.   Family History:  Family History  Problem Relation Age of Onset  . Heart disease Mother     Review of Systems: General:  negative for chills, fever, night sweats or weight changes.  Cardiovascular: negative for  chest pain, dyspnea on exertion, edema, orthopnea, palpitations, paroxysmal nocturnal dyspnea or shortness of breath Dermatological: negative for rash Respiratory: negative for cough or wheezing Urologic: negative for hematuria Abdominal: abdominal discomfort, loose stools, negative for nausea, vomiting, bright red blood per rectum, melena, or hematemesis Neurologic: negative for visual changes, syncope, or dizziness All other systems reviewed and are otherwise negative except as noted above.  PHYSICAL EXAM: VS:  BP 147/74  Pulse 65  Ht 5\' 7"  (1.702 m)  Wt 217 lb 8 oz (98.657 kg)  BMI 34.06 kg/m2 Well nourished, well developed, in no acute distress HEENT: normal, PERRL Neck: no JVD or bruits Cardiac:  normal S1, S2; RRR; no murmur or gallops Lungs:  clear to auscultation bilaterally, no wheezing, rhonchi or rales Abd: soft, nontender, no hepatomegaly, normoactive BS x 4 quads Ext: no edema, cyanosis or clubbing Skin: warm and dry, cap refill < 2 sec Neuro:  CNs 2-12 intact, no focal abnormalities noted Musculoskeletal: strength and tone appropriate for age  Psych: normal affect  O2 saturation at rest: 96% O2 saturation on ambulation: 94%

## 2013-07-16 NOTE — Assessment & Plan Note (Signed)
Denies ischemic symptoms. No ischemic changes on EKG. Continue low-dose ASA, BB, statin.

## 2013-07-16 NOTE — Assessment & Plan Note (Signed)
Euvolemic on exam. Continue current medical therapy.

## 2013-07-16 NOTE — Patient Instructions (Signed)

## 2013-07-16 NOTE — Assessment & Plan Note (Signed)
Well-controlled today. Continue current antihypertensives.

## 2013-07-16 NOTE — Assessment & Plan Note (Signed)
Maintaining NSR today. Continue amiodarone 200mg  BID until 4/17, then down-titrate to 200mg  PO daily. Continue BB. Will refer to Coumadin clinic today to check INR. Optimistic amiodarone may be able to be weaned once further recovered from surgery.

## 2013-07-18 ENCOUNTER — Telehealth: Payer: Self-pay

## 2013-07-18 NOTE — Telephone Encounter (Signed)
Left msg with pt daughter, Earley Abide, informing of release packet being mailed for [employer]

## 2013-07-21 ENCOUNTER — Telehealth: Payer: Self-pay | Admitting: *Deleted

## 2013-07-21 NOTE — Telephone Encounter (Signed)
LVM for patients daughter letting her know that her FMLA forms would need to be filled out by her mothers pcp Informed her that the original has been left at out front desk for her

## 2013-07-23 ENCOUNTER — Ambulatory Visit (INDEPENDENT_AMBULATORY_CARE_PROVIDER_SITE_OTHER): Payer: Medicare Other

## 2013-07-23 DIAGNOSIS — Z5181 Encounter for therapeutic drug level monitoring: Secondary | ICD-10-CM

## 2013-07-23 DIAGNOSIS — I4891 Unspecified atrial fibrillation: Secondary | ICD-10-CM

## 2013-07-23 LAB — POCT INR: INR: 1.2

## 2013-07-23 MED ORDER — WARFARIN SODIUM 1 MG PO TABS: ORAL_TABLET | ORAL | Status: DC

## 2013-07-30 ENCOUNTER — Ambulatory Visit (INDEPENDENT_AMBULATORY_CARE_PROVIDER_SITE_OTHER): Payer: Medicare Other

## 2013-07-30 DIAGNOSIS — I4891 Unspecified atrial fibrillation: Secondary | ICD-10-CM

## 2013-07-30 DIAGNOSIS — Z5181 Encounter for therapeutic drug level monitoring: Secondary | ICD-10-CM

## 2013-07-30 LAB — POCT INR: INR: 1

## 2013-08-06 ENCOUNTER — Ambulatory Visit (INDEPENDENT_AMBULATORY_CARE_PROVIDER_SITE_OTHER): Payer: Medicare Other

## 2013-08-06 DIAGNOSIS — I4891 Unspecified atrial fibrillation: Secondary | ICD-10-CM

## 2013-08-06 DIAGNOSIS — Z5181 Encounter for therapeutic drug level monitoring: Secondary | ICD-10-CM

## 2013-08-06 LAB — POCT INR: INR: 1

## 2013-08-06 MED ORDER — WARFARIN SODIUM 1 MG PO TABS: ORAL_TABLET | ORAL | Status: DC

## 2013-08-09 DIAGNOSIS — I10 Essential (primary) hypertension: Secondary | ICD-10-CM

## 2013-08-09 DIAGNOSIS — I4891 Unspecified atrial fibrillation: Secondary | ICD-10-CM

## 2013-08-09 DIAGNOSIS — I2589 Other forms of chronic ischemic heart disease: Secondary | ICD-10-CM

## 2013-08-09 DIAGNOSIS — I2510 Atherosclerotic heart disease of native coronary artery without angina pectoris: Secondary | ICD-10-CM

## 2013-08-11 ENCOUNTER — Ambulatory Visit (INDEPENDENT_AMBULATORY_CARE_PROVIDER_SITE_OTHER): Payer: Medicare Other

## 2013-08-11 ENCOUNTER — Encounter: Payer: Self-pay | Admitting: Cardiovascular Disease

## 2013-08-11 DIAGNOSIS — Z5181 Encounter for therapeutic drug level monitoring: Secondary | ICD-10-CM

## 2013-08-11 DIAGNOSIS — I4891 Unspecified atrial fibrillation: Secondary | ICD-10-CM

## 2013-08-11 LAB — POCT INR: INR: 1.3

## 2013-08-11 NOTE — Progress Notes (Signed)
This encounter was created in error - please disregard.  This encounter was created in error - please disregard.

## 2013-08-18 ENCOUNTER — Ambulatory Visit (INDEPENDENT_AMBULATORY_CARE_PROVIDER_SITE_OTHER): Payer: Medicare Other

## 2013-08-18 DIAGNOSIS — I4891 Unspecified atrial fibrillation: Secondary | ICD-10-CM

## 2013-08-18 DIAGNOSIS — Z5181 Encounter for therapeutic drug level monitoring: Secondary | ICD-10-CM

## 2013-08-18 LAB — POCT INR: INR: 1.3

## 2013-08-19 ENCOUNTER — Ambulatory Visit (INDEPENDENT_AMBULATORY_CARE_PROVIDER_SITE_OTHER): Payer: Medicare Other | Admitting: Cardiovascular Disease

## 2013-08-19 ENCOUNTER — Encounter: Payer: Self-pay | Admitting: Cardiovascular Disease

## 2013-08-19 VITALS — BP 138/88 | HR 84 | Ht 67.0 in | Wt 208.0 lb

## 2013-08-19 DIAGNOSIS — I480 Paroxysmal atrial fibrillation: Secondary | ICD-10-CM

## 2013-08-19 DIAGNOSIS — I5022 Chronic systolic (congestive) heart failure: Secondary | ICD-10-CM

## 2013-08-19 DIAGNOSIS — I2510 Atherosclerotic heart disease of native coronary artery without angina pectoris: Secondary | ICD-10-CM

## 2013-08-19 DIAGNOSIS — I4891 Unspecified atrial fibrillation: Secondary | ICD-10-CM

## 2013-08-19 MED ORDER — AMIODARONE HCL 200 MG PO TABS: 200.0000 mg | ORAL_TABLET | Freq: Every day | ORAL | Status: DC

## 2013-08-19 MED ORDER — LISINOPRIL 10 MG PO TABS: 10.0000 mg | ORAL_TABLET | Freq: Every day | ORAL | Status: DC

## 2013-08-19 NOTE — Patient Instructions (Signed)
Your physician has recommended you make the following change in your medication:  Start Lisinopril 10 mg daily Your Amiodarone has been filled   Your physician recommends that you return for lab work in:  1 week  BMP  Your physician recommends that you schedule a follow-up appointment in:  3 months

## 2013-08-19 NOTE — Progress Notes (Signed)
HPI  This is a 63 year old female who is here today for a followup visit regarding cardiomyopathy, presumed underlying coronary artery disease and atrial fibrillation. Other medical problems include hypertension, 2 diabetes, hyperlipidemia and carotid artery disease status post left carotid endarterectomy with previous CVA.  She was evaluated in 2013 initially in the setting of acute CVA. A 2D echo was performed revealing EF 45-50% and anterior HK, severe apical HK, impaired LV relaxation, mild LA dilatation noted at that time. She subsequent underwent Lexiscan Myoview confirming prior anterior infarct, no evidence of ischemia. EF 41%. She was treated medically with ASA, BB, statin. She followed up 04/2012. No anginal or CHF type symptoms. Declined outpatient cath.  She was hospitalized in March of 2015 at [facility] for acute cholecystitis. In this setting, she developed a-fib w/ RVR requiring transfer to CCU for diltiazem and amiodarone infusion and titration for rate-control. She converted to sinus rhythm. She underwent cholecystitis without complications. She was started on anticoagulation with warfarin. She has been doing well and denies chest pain, dyspnea or palpitations. Echocardiogram while hospitalized showed an ejection fraction of 35-40%.    No Known Allergies   Current Outpatient Prescriptions on File Prior to Visit  Medication Sig Dispense Refill  . amiodarone (PACERONE) 200 MG tablet Take 200 mg by mouth daily.       Marland Kitchen aspirin 81 MG tablet Take 81 mg by mouth daily.      Marland Kitchen atorvastatin (LIPITOR) 10 MG tablet Take 10 mg by mouth daily.      . furosemide (LASIX) 40 MG tablet Take 40 mg by mouth daily.      Marland Kitchen glipiZIDE (GLUCOTROL) 5 MG tablet Takes 2 tablets am and 1 tablet pm daily.      . metoprolol tartrate (LOPRESSOR) 25 MG tablet TAKE ONE TABLET BY MOUTH TWICE DAILY  60 tablet  5  . Omega-3 Fatty Acids (FISH OIL) 1200 MG CAPS Take by mouth daily.      Marland Kitchen warfarin (COUMADIN) 1 MG  tablet Take as directed by anticoagulation clinic  140 tablet  1   No current facility-administered medications on file prior to visit.     Past Medical History  Diagnosis Date  . Hypertension   . Diabetes mellitus without complication     Type II  . Hyperlipidemia   . Stroke 2013  . Coronary artery disease      Past Surgical History  Procedure Laterality Date  . Leg surgery      right;infection  . Carotid endarterectomy      [facility]; Dr. Lucky Cowboy  . Cholecystectomy       Family History  Problem Relation Age of Onset  . Heart disease Mother      History   Social History  . Marital Status: Widowed    Spouse Name: N/A    Number of Children: N/A  . Years of Education: N/A   Occupational History  . Not on file.   Social History Main Topics  . Smoking status: Former Smoker -- 0.50 packs/day for 5 years    Types: Cigarettes  . Smokeless tobacco: Not on file  . Alcohol Use: No  . Drug Use: No  . Sexual Activity: Not on file   Other Topics Concern  . Not on file   Social History Narrative  . No narrative on file     PHYSICAL EXAM   BP 138/88  Pulse 84  Ht 5\' 7"  (1.702 m)  Wt 208 lb (94.348  kg)  BMI 32.57 kg/m2 Constitutional: She is oriented to person, place, and time. She appears well-developed and well-nourished. No distress.  HENT: No nasal discharge.  Head: Normocephalic and atraumatic.  Eyes: Pupils are equal and round. Right eye exhibits no discharge. Left eye exhibits no discharge.  Neck: Normal range of motion. Neck supple. No JVD present. No thyromegaly present. Left carotid bruit. Cardiovascular: Normal rate, regular rhythm, normal heart sounds. Exam reveals no gallop and no friction rub. No murmur heard.  Pulmonary/Chest: Effort normal and breath sounds normal. No stridor. No respiratory distress. She has no wheezes. She has no rales. She exhibits no tenderness.  Abdominal: Soft. Bowel sounds are normal. She exhibits no distension. There is no  tenderness. There is no rebound and no guarding.  Musculoskeletal: Normal range of motion. She exhibits no edema and no tenderness.  Neurological: She is alert and oriented to person, place, and time. Coordination normal.  Skin: Skin is warm and dry. No rash noted. She is not diaphoretic. No erythema. No pallor.  Psychiatric: She has a normal mood and affect. Her behavior is normal. Judgment and thought content normal.   EKG: Normal sinus rhythm with a PVC, left atrial enlargement and old septal infarct.   ASSESSMENT AND PLAN

## 2013-08-19 NOTE — Assessment & Plan Note (Signed)
She appears to be euvolemic on current medications. Most recent ejection fraction was 35-40%. Continue treatment with metoprolol. I added lisinopril 10 mg once daily. Check basic metabolic profile in one week.

## 2013-08-19 NOTE — Assessment & Plan Note (Signed)
She is maintaining in sinus rhythm on amiodarone. Atrial fibrillation happened in the setting of acute cholecystitis. I am planning to stop amiodarone in few months and likely increase the dose of metoprolol. Continue anticoagulation with warfarin. She cannot afford NOACs and has no prescription plan.

## 2013-08-19 NOTE — Assessment & Plan Note (Signed)
She has no symptoms of angina. Continue medical therapy. Aspirin can likely be discontinued in the near future once her INR was regulated.

## 2013-08-22 ENCOUNTER — Ambulatory Visit (INDEPENDENT_AMBULATORY_CARE_PROVIDER_SITE_OTHER): Payer: Medicare Other

## 2013-08-22 DIAGNOSIS — Z5181 Encounter for therapeutic drug level monitoring: Secondary | ICD-10-CM

## 2013-08-22 DIAGNOSIS — I4891 Unspecified atrial fibrillation: Secondary | ICD-10-CM

## 2013-08-22 LAB — POCT INR: INR: 1.7

## 2013-08-26 ENCOUNTER — Ambulatory Visit (INDEPENDENT_AMBULATORY_CARE_PROVIDER_SITE_OTHER): Payer: Medicare Other

## 2013-08-26 ENCOUNTER — Ambulatory Visit (INDEPENDENT_AMBULATORY_CARE_PROVIDER_SITE_OTHER): Payer: Medicare Other | Admitting: *Deleted

## 2013-08-26 DIAGNOSIS — I4891 Unspecified atrial fibrillation: Secondary | ICD-10-CM

## 2013-08-26 DIAGNOSIS — I5022 Chronic systolic (congestive) heart failure: Secondary | ICD-10-CM

## 2013-08-26 DIAGNOSIS — Z5181 Encounter for therapeutic drug level monitoring: Secondary | ICD-10-CM

## 2013-08-26 LAB — POCT INR: INR: 1.9

## 2013-08-27 LAB — BASIC METABOLIC PANEL
BUN/Creatinine Ratio: 31 — ABNORMAL HIGH (ref 11–26)
BUN: 26 mg/dL (ref 8–27)
CO2: 19 mmol/L (ref 18–29)
Calcium: 9.8 mg/dL (ref 8.7–10.3)
Chloride: 100 mmol/L (ref 97–108)
Creatinine, Ser: 0.84 mg/dL (ref 0.57–1.00)
GFR, EST AFRICAN AMERICAN: 84 mL/min/{1.73_m2} (ref >59)
GFR, EST NON AFRICAN AMERICAN: 73 mL/min/{1.73_m2} (ref >59)
GLUCOSE: 289 mg/dL — AB (ref 65–99)
Potassium: 5 mmol/L (ref 3.5–5.2)
Sodium: 138 mmol/L (ref 134–144)

## 2013-09-03 ENCOUNTER — Ambulatory Visit (INDEPENDENT_AMBULATORY_CARE_PROVIDER_SITE_OTHER): Payer: Medicare Other

## 2013-09-03 DIAGNOSIS — E1142 Type 2 diabetes mellitus with diabetic polyneuropathy: Secondary | ICD-10-CM | POA: Insufficient documentation

## 2013-09-03 DIAGNOSIS — E1122 Type 2 diabetes mellitus with diabetic chronic kidney disease: Secondary | ICD-10-CM | POA: Insufficient documentation

## 2013-09-03 DIAGNOSIS — I4891 Unspecified atrial fibrillation: Secondary | ICD-10-CM

## 2013-09-03 DIAGNOSIS — Z5181 Encounter for therapeutic drug level monitoring: Secondary | ICD-10-CM

## 2013-09-03 DIAGNOSIS — Z8673 Personal history of transient ischemic attack (TIA), and cerebral infarction without residual deficits: Secondary | ICD-10-CM | POA: Insufficient documentation

## 2013-09-03 DIAGNOSIS — E785 Hyperlipidemia, unspecified: Secondary | ICD-10-CM | POA: Insufficient documentation

## 2013-09-03 DIAGNOSIS — E1169 Type 2 diabetes mellitus with other specified complication: Secondary | ICD-10-CM | POA: Insufficient documentation

## 2013-09-03 LAB — POCT INR: INR: 2.1

## 2013-09-17 ENCOUNTER — Ambulatory Visit (INDEPENDENT_AMBULATORY_CARE_PROVIDER_SITE_OTHER): Payer: Medicare Other

## 2013-09-17 DIAGNOSIS — I4891 Unspecified atrial fibrillation: Secondary | ICD-10-CM

## 2013-09-17 DIAGNOSIS — Z5181 Encounter for therapeutic drug level monitoring: Secondary | ICD-10-CM

## 2013-09-17 LAB — POCT INR: INR: 3.6

## 2013-10-01 ENCOUNTER — Ambulatory Visit (INDEPENDENT_AMBULATORY_CARE_PROVIDER_SITE_OTHER): Payer: Medicare Other

## 2013-10-01 DIAGNOSIS — Z5181 Encounter for therapeutic drug level monitoring: Secondary | ICD-10-CM

## 2013-10-01 DIAGNOSIS — I4891 Unspecified atrial fibrillation: Secondary | ICD-10-CM

## 2013-10-01 LAB — POCT INR: INR: 2.1

## 2013-10-09 ENCOUNTER — Other Ambulatory Visit: Payer: Self-pay | Admitting: *Deleted

## 2013-10-09 MED ORDER — WARFARIN SODIUM 1 MG PO TABS: ORAL_TABLET | ORAL | Status: DC

## 2013-10-22 ENCOUNTER — Ambulatory Visit (INDEPENDENT_AMBULATORY_CARE_PROVIDER_SITE_OTHER): Payer: Medicare Other

## 2013-10-22 DIAGNOSIS — Z5181 Encounter for therapeutic drug level monitoring: Secondary | ICD-10-CM

## 2013-10-22 DIAGNOSIS — I4891 Unspecified atrial fibrillation: Secondary | ICD-10-CM

## 2013-10-22 LAB — POCT INR: INR: 2.7

## 2013-11-12 ENCOUNTER — Telehealth: Payer: Self-pay

## 2013-11-12 MED ORDER — WARFARIN SODIUM 3 MG PO TABS: ORAL_TABLET | ORAL | Status: DC

## 2013-11-12 NOTE — Telephone Encounter (Signed)
Returned call to pt, as previously discussed with pt at last OV, pt would like to switch from the 1mg  tablets to the 3mg  tablets.  Pt verbalizes understanding of taking 2 of those tablets on 6mg  days and 3 tablets on 9mg  days.  Rx sent into [facility] in [place] as requested.

## 2013-11-19 ENCOUNTER — Ambulatory Visit (INDEPENDENT_AMBULATORY_CARE_PROVIDER_SITE_OTHER): Payer: Medicare Other | Admitting: *Deleted

## 2013-11-19 DIAGNOSIS — Z5181 Encounter for therapeutic drug level monitoring: Secondary | ICD-10-CM

## 2013-11-19 DIAGNOSIS — I4891 Unspecified atrial fibrillation: Secondary | ICD-10-CM

## 2013-11-19 LAB — POCT INR: INR: 2.2

## 2013-11-20 ENCOUNTER — Encounter: Payer: Self-pay | Admitting: Cardiovascular Disease

## 2013-11-20 ENCOUNTER — Ambulatory Visit (INDEPENDENT_AMBULATORY_CARE_PROVIDER_SITE_OTHER): Payer: Medicare Other | Admitting: Cardiovascular Disease

## 2013-11-20 VITALS — BP 112/78 | HR 64 | Ht 68.0 in | Wt 221.2 lb

## 2013-11-20 DIAGNOSIS — I4891 Unspecified atrial fibrillation: Secondary | ICD-10-CM

## 2013-11-20 DIAGNOSIS — I5022 Chronic systolic (congestive) heart failure: Secondary | ICD-10-CM

## 2013-11-20 DIAGNOSIS — I2510 Atherosclerotic heart disease of native coronary artery without angina pectoris: Secondary | ICD-10-CM

## 2013-11-20 MED ORDER — FUROSEMIDE 20 MG PO TABS: 20.0000 mg | ORAL_TABLET | Freq: Every day | ORAL | Status: DC

## 2013-11-20 MED ORDER — ATORVASTATIN CALCIUM 10 MG PO TABS: 10.0000 mg | ORAL_TABLET | Freq: Every day | ORAL | Status: DC

## 2013-11-20 MED ORDER — AMIODARONE HCL 100 MG PO TABS: 100.0000 mg | ORAL_TABLET | Freq: Every day | ORAL | Status: DC

## 2013-11-20 NOTE — Assessment & Plan Note (Signed)
She appears to be euvolemic. I decreased the dose of Lasix to 20 mg once daily. Continue treatment with lisinopril and metoprolol.

## 2013-11-20 NOTE — Assessment & Plan Note (Signed)
She has no symptoms of angina. Continue medical therapy. 

## 2013-11-20 NOTE — Progress Notes (Signed)
HPI  This is a 63 year old female who is here today for a followup visit regarding cardiomyopathy, presumed underlying coronary artery disease and atrial fibrillation. Other medical problems include hypertension, 2 diabetes, hyperlipidemia and carotid artery disease status post left carotid endarterectomy with previous CVA.  She was evaluated in 2013 initially in the setting of acute CVA. A 2D echo was performed revealing EF 45-50% and anterior HK, severe apical HK, impaired LV relaxation, mild LA dilatation noted at that time. She subsequent underwent Lexiscan Myoview confirming prior anterior infarct, no evidence of ischemia. EF 41%. She was treated medically with ASA, BB, statin. She followed up 04/2012. No anginal or CHF type symptoms. Declined outpatient cath.  She was hospitalized in March of 2015 at [facility] for acute cholecystitis. In this setting, she developed a-fib w/ RVR requiring transfer to CCU for diltiazem and amiodarone infusion and titration for rate-control. She converted to sinus rhythm. She underwent cholecystitis without complications. She was started on anticoagulation with warfarin.  Echocardiogram while hospitalized showed an ejection fraction of 35-40%. She has been doing well and denies chest pain, dyspnea or palpitations.    No Known Allergies   Current Outpatient Prescriptions on File Prior to Visit  Medication Sig Dispense Refill  . amiodarone (PACERONE) 200 MG tablet Take 1 tablet (200 mg total) by mouth daily.  30 tablet  3  . aspirin 81 MG tablet Take 81 mg by mouth daily.      . furosemide (LASIX) 40 MG tablet Take 40 mg by mouth daily.      Marland Kitchen glipiZIDE (GLUCOTROL) 5 MG tablet Takes 2 tablets am and 1 tablet pm daily.      Marland Kitchen lisinopril (PRINIVIL,ZESTRIL) 10 MG tablet Take 1 tablet (10 mg total) by mouth daily.  90 tablet  3  . metoprolol tartrate (LOPRESSOR) 25 MG tablet TAKE ONE TABLET BY MOUTH TWICE DAILY  60 tablet  5  . Omega-3 Fatty Acids (FISH OIL) 1200  MG CAPS Take by mouth daily.      Marland Kitchen warfarin (COUMADIN) 3 MG tablet Take as directed by anticoagulation clinic  75 tablet  3   No current facility-administered medications on file prior to visit.     Past Medical History  Diagnosis Date  . Hypertension   . Diabetes mellitus without complication     Type II  . Hyperlipidemia   . Stroke 2013  . Coronary artery disease      Past Surgical History  Procedure Laterality Date  . Leg surgery      right;infection  . Carotid endarterectomy      [facility]; Dr. Lucky Cowboy  . Cholecystectomy       Family History  Problem Relation Age of Onset  . Heart disease Mother      History   Social History  . Marital Status: Widowed    Spouse Name: N/A    Number of Children: N/A  . Years of Education: N/A   Occupational History  . Not on file.   Social History Main Topics  . Smoking status: Former Smoker -- 0.50 packs/day for 5 years    Types: Cigarettes  . Smokeless tobacco: Not on file  . Alcohol Use: No  . Drug Use: No  . Sexual Activity: Not on file   Other Topics Concern  . Not on file   Social History Narrative  . No narrative on file     PHYSICAL EXAM   BP 112/78  Pulse 64  Ht 5'  8" (1.727 m)  Wt 221 lb 4 oz (100.358 kg)  BMI 33.65 kg/m2 Constitutional: She is oriented to person, place, and time. She appears well-developed and well-nourished. No distress.  HENT: No nasal discharge.  Head: Normocephalic and atraumatic.  Eyes: Pupils are equal and round. Right eye exhibits no discharge. Left eye exhibits no discharge.  Neck: Normal range of motion. Neck supple. No JVD present. No thyromegaly present. Left carotid bruit. Cardiovascular: Normal rate, regular rhythm, normal heart sounds. Exam reveals no gallop and no friction rub. No murmur heard.  Pulmonary/Chest: Effort normal and breath sounds normal. No stridor. No respiratory distress. She has no wheezes. She has no rales. She exhibits no tenderness.  Abdominal: Soft.  Bowel sounds are normal. She exhibits no distension. There is no tenderness. There is no rebound and no guarding.  Musculoskeletal: Normal range of motion. She exhibits no edema and no tenderness.  Neurological: She is alert and oriented to person, place, and time. Coordination normal.  Skin: Skin is warm and dry. No rash noted. She is not diaphoretic. No erythema. No pallor.  Psychiatric: She has a normal mood and affect. Her behavior is normal. Judgment and thought content normal.   EKG: Sinus  Rhythm  Low voltage in precordial leads.   -  Nonspecific T-abnormality.   ABNORMAL     ASSESSMENT AND PLAN

## 2013-11-20 NOTE — Assessment & Plan Note (Signed)
She has been maintaining in sinus rhythm with no evidence of recurrent arrhythmia. I decreased the dose of amiodarone to 100 mg once daily. Atrial fibrillation was post operative and thus most likely I will stop this medication in 6 months. She is currently on anticoagulation with warfarin.

## 2013-11-20 NOTE — Patient Instructions (Signed)
Your physician has recommended you make the following change in your medication:  Decrease Amiodarone to 100 mg once daily  Decrease Lasix to 20 mg once daily  Resume Atorvastatin to 10 mg once daily   Your physician wants you to follow-up in: 6 months. You will receive a reminder letter in the mail two months in advance. If you don't receive a letter, please call our office to schedule the follow-up appointment.

## 2013-11-27 ENCOUNTER — Telehealth: Payer: Self-pay

## 2013-11-27 MED ORDER — AMIODARONE HCL 100 MG PO TABS: 100.0000 mg | ORAL_TABLET | Freq: Every day | ORAL | Status: DC

## 2013-11-27 NOTE — Telephone Encounter (Signed)
Prescription re sent for 30 days instead of 90 Instructed patient to call if she was still having cost issues  Patient verbalized understanding

## 2013-11-27 NOTE — Telephone Encounter (Signed)
Pt states the her Amiodarone is $400, needs alternative. Please call.

## 2013-11-27 NOTE — Telephone Encounter (Signed)
Amiodarone should not be more than 110 $. She is taking 1/2 tablet so that should be the cost of 2 months.

## 2013-12-17 ENCOUNTER — Ambulatory Visit (INDEPENDENT_AMBULATORY_CARE_PROVIDER_SITE_OTHER): Payer: Medicare Other

## 2013-12-17 DIAGNOSIS — Z5181 Encounter for therapeutic drug level monitoring: Secondary | ICD-10-CM

## 2013-12-17 DIAGNOSIS — I4891 Unspecified atrial fibrillation: Secondary | ICD-10-CM

## 2013-12-17 LAB — POCT INR: INR: 2.4

## 2014-01-21 ENCOUNTER — Telehealth: Payer: Self-pay | Admitting: *Deleted

## 2014-01-21 NOTE — Telephone Encounter (Signed)
Confirmed with patient that her Amiodarone dose is 100 mg once daily  Patient verbalized understanding

## 2014-01-21 NOTE — Telephone Encounter (Signed)
What is the correct mg on her Amiodarone? Please call patient.

## 2014-01-28 ENCOUNTER — Ambulatory Visit (INDEPENDENT_AMBULATORY_CARE_PROVIDER_SITE_OTHER): Payer: Medicare Other

## 2014-01-28 DIAGNOSIS — I4891 Unspecified atrial fibrillation: Secondary | ICD-10-CM

## 2014-01-28 DIAGNOSIS — Z5181 Encounter for therapeutic drug level monitoring: Secondary | ICD-10-CM

## 2014-01-28 LAB — POCT INR: INR: 1.7

## 2014-02-18 ENCOUNTER — Ambulatory Visit (INDEPENDENT_AMBULATORY_CARE_PROVIDER_SITE_OTHER): Payer: Medicare Other | Admitting: Pharmacist

## 2014-02-18 DIAGNOSIS — I4891 Unspecified atrial fibrillation: Secondary | ICD-10-CM

## 2014-02-18 DIAGNOSIS — Z5181 Encounter for therapeutic drug level monitoring: Secondary | ICD-10-CM

## 2014-02-18 LAB — POCT INR: INR: 2.6

## 2014-03-25 ENCOUNTER — Ambulatory Visit (INDEPENDENT_AMBULATORY_CARE_PROVIDER_SITE_OTHER): Payer: Medicare Other

## 2014-03-25 DIAGNOSIS — Z5181 Encounter for therapeutic drug level monitoring: Secondary | ICD-10-CM

## 2014-03-25 DIAGNOSIS — I4891 Unspecified atrial fibrillation: Secondary | ICD-10-CM

## 2014-03-25 LAB — POCT INR: INR: 1.8

## 2014-03-30 ENCOUNTER — Other Ambulatory Visit: Payer: Self-pay | Admitting: Cardiovascular Disease

## 2014-03-30 NOTE — Telephone Encounter (Signed)
Please review for refill, Thank you. 

## 2014-04-15 ENCOUNTER — Ambulatory Visit (INDEPENDENT_AMBULATORY_CARE_PROVIDER_SITE_OTHER): Payer: Medicare Other

## 2014-04-15 DIAGNOSIS — Z5181 Encounter for therapeutic drug level monitoring: Secondary | ICD-10-CM | POA: Diagnosis not present

## 2014-04-15 DIAGNOSIS — I4891 Unspecified atrial fibrillation: Secondary | ICD-10-CM | POA: Diagnosis not present

## 2014-04-15 LAB — POCT INR: INR: 1.5

## 2014-04-17 ENCOUNTER — Telehealth: Payer: Self-pay | Admitting: Cardiovascular Disease

## 2014-04-17 NOTE — Telephone Encounter (Signed)
She should not take this medication given that she has heart failure. She should ask her PCP for a different medication.

## 2014-04-17 NOTE — Telephone Encounter (Signed)
Informed patient of Dr. Jacklynn Ganong response  Patient verbalized understanding

## 2014-04-17 NOTE — Telephone Encounter (Signed)
Pt. C/o medication issue: 1. Name of medication: Pioglitazone 2. How are you currently taking this medication (dosage and times per day)? 15mg  once daily 3. Are you having a reaction (difficulty breathing --STAT)?  No Patient has not started taking medication yet 4. What is your medication issue?  This is a newly prescribed drug from PCP and patient has concerns about Cardiac interaction warnings on label she would like to discuss and get approval from Cardiology before taking. Please call patient

## 2014-04-27 DIAGNOSIS — E1140 Type 2 diabetes mellitus with diabetic neuropathy, unspecified: Secondary | ICD-10-CM | POA: Diagnosis not present

## 2014-04-27 DIAGNOSIS — L97511 Non-pressure chronic ulcer of other part of right foot limited to breakdown of skin: Secondary | ICD-10-CM | POA: Diagnosis not present

## 2014-04-29 ENCOUNTER — Ambulatory Visit (INDEPENDENT_AMBULATORY_CARE_PROVIDER_SITE_OTHER): Payer: Medicare Other

## 2014-04-29 DIAGNOSIS — I4891 Unspecified atrial fibrillation: Secondary | ICD-10-CM

## 2014-04-29 DIAGNOSIS — Z5181 Encounter for therapeutic drug level monitoring: Secondary | ICD-10-CM

## 2014-04-29 LAB — POCT INR: INR: 2.3

## 2014-05-04 ENCOUNTER — Other Ambulatory Visit: Payer: Self-pay | Admitting: *Deleted

## 2014-05-04 MED ORDER — WARFARIN SODIUM 3 MG PO TABS: 3.0000 mg | ORAL_TABLET | ORAL | Status: DC

## 2014-05-19 ENCOUNTER — Ambulatory Visit (INDEPENDENT_AMBULATORY_CARE_PROVIDER_SITE_OTHER): Payer: Medicare Other | Admitting: Cardiovascular Disease

## 2014-05-19 ENCOUNTER — Ambulatory Visit (INDEPENDENT_AMBULATORY_CARE_PROVIDER_SITE_OTHER): Payer: Medicare Other

## 2014-05-19 ENCOUNTER — Encounter: Payer: Self-pay | Admitting: Cardiovascular Disease

## 2014-05-19 VITALS — BP 126/82 | HR 75 | Ht 67.0 in | Wt 210.0 lb

## 2014-05-19 DIAGNOSIS — I4891 Unspecified atrial fibrillation: Secondary | ICD-10-CM

## 2014-05-19 DIAGNOSIS — I10 Essential (primary) hypertension: Secondary | ICD-10-CM | POA: Diagnosis not present

## 2014-05-19 DIAGNOSIS — I2510 Atherosclerotic heart disease of native coronary artery without angina pectoris: Secondary | ICD-10-CM | POA: Diagnosis not present

## 2014-05-19 DIAGNOSIS — Z5181 Encounter for therapeutic drug level monitoring: Secondary | ICD-10-CM

## 2014-05-19 DIAGNOSIS — I5022 Chronic systolic (congestive) heart failure: Secondary | ICD-10-CM

## 2014-05-19 LAB — POCT INR: INR: 2

## 2014-05-19 NOTE — Progress Notes (Signed)
HPI  This is a 64 year old female who is here today for a followup visit regarding cardiomyopathy, presumed underlying coronary artery disease and atrial fibrillation. Other medical problems include hypertension, 2 diabetes, hyperlipidemia and carotid artery disease status post left carotid endarterectomy with previous CVA.  She was evaluated in 2013 initially in the setting of acute CVA. A 2D echo was performed revealing EF 45-50% and anterior HK, severe apical HK, impaired LV relaxation, mild LA dilatation noted at that time. She subsequently underwent Lexiscan Myoview confirming prior anterior infarct, no evidence of ischemia. EF 41%. She was treated medically with ASA, BB, statin. She followed up 04/2012. No anginal or CHF type symptoms. Declined outpatient cath.  She was hospitalized in March of 2015 at [facility] for acute cholecystitis. In this setting, she developed a-fib w/ RVR requiring transfer to CCU for diltiazem and amiodarone infusion and titration for rate-control. She converted to sinus rhythm. She underwent cholecystitis without complications. She was started on anticoagulation with warfarin.  Echocardiogram while hospitalized showed an ejection fraction of 35-40%. She has been doing well and denies chest pain, dyspnea or palpitations.    No Known Allergies   Current Outpatient Prescriptions on File Prior to Visit  Medication Sig Dispense Refill  . aspirin 81 MG tablet Take 81 mg by mouth daily.    Marland Kitchen atorvastatin (LIPITOR) 10 MG tablet Take 1 tablet (10 mg total) by mouth daily. 90 tablet 3  . furosemide (LASIX) 20 MG tablet Take 1 tablet (20 mg total) by mouth daily. 90 tablet 3  . glipiZIDE (GLUCOTROL) 5 MG tablet Takes 2 tablets am and 1 tablet pm daily.    Marland Kitchen lisinopril (PRINIVIL,ZESTRIL) 10 MG tablet Take 1 tablet (10 mg total) by mouth daily. 90 tablet 3  . metoprolol tartrate (LOPRESSOR) 25 MG tablet TAKE ONE TABLET BY MOUTH TWICE DAILY 60 tablet 5  . Omega-3 Fatty Acids  (FISH OIL) 1200 MG CAPS Take by mouth daily.    Marland Kitchen warfarin (COUMADIN) 3 MG tablet Take 1 tablet (3 mg total) by mouth as directed. 75 tablet 3   No current facility-administered medications on file prior to visit.     Past Medical History  Diagnosis Date  . Hypertension   . Diabetes mellitus without complication     Type II  . Hyperlipidemia   . Stroke 2013  . Coronary artery disease      Past Surgical History  Procedure Laterality Date  . Leg surgery      right;infection  . Carotid endarterectomy      [facility]; Dr. Lucky Cowboy  . Cholecystectomy       Family History  Problem Relation Age of Onset  . Heart disease Mother      History   Social History  . Marital Status: Widowed    Spouse Name: N/A  . Number of Children: N/A  . Years of Education: N/A   Occupational History  . Not on file.   Social History Main Topics  . Smoking status: Former Smoker -- 0.50 packs/day for 5 years    Types: Cigarettes  . Smokeless tobacco: Not on file  . Alcohol Use: No  . Drug Use: No  . Sexual Activity: Not on file   Other Topics Concern  . Not on file   Social History Narrative     PHYSICAL EXAM   BP 126/82 mmHg  Pulse 75  Ht 5\' 7"  (1.702 m)  Wt 210 lb (95.255 kg)  BMI 32.88 kg/m2 Constitutional:  She is oriented to person, place, and time. She appears well-developed and well-nourished. No distress.  HENT: No nasal discharge.  Head: Normocephalic and atraumatic.  Eyes: Pupils are equal and round. Right eye exhibits no discharge. Left eye exhibits no discharge.  Neck: Normal range of motion. Neck supple. No JVD present. No thyromegaly present. Left carotid bruit. Cardiovascular: Normal rate, regular rhythm, normal heart sounds. Exam reveals no gallop and no friction rub. No murmur heard.  Pulmonary/Chest: Effort normal and breath sounds normal. No stridor. No respiratory distress. She has no wheezes. She has no rales. She exhibits no tenderness.  Abdominal: Soft. Bowel  sounds are normal. She exhibits no distension. There is no tenderness. There is no rebound and no guarding.  Musculoskeletal: Normal range of motion. She exhibits no edema and no tenderness.  Neurological: She is alert and oriented to person, place, and time. Coordination normal.  Skin: Skin is warm and dry. No rash noted. She is not diaphoretic. No erythema. No pallor.  Psychiatric: She has a normal mood and affect. Her behavior is normal. Judgment and thought content normal.   [id number]  Rhythm  Low voltage in precordial leads.   -  Nonspecific T-abnormality.   ABNORMAL      ASSESSMENT AND PLAN

## 2014-05-19 NOTE — Patient Instructions (Signed)
Your physician has recommended you make the following change in your medication:  Stop Amiodarone   Your physician wants you to follow-up in: 6 months with Dr. Fletcher Anon. You will receive a reminder letter in the mail two months in advance. If you don't receive a letter, please call our office to schedule the follow-up appointment.

## 2014-05-21 NOTE — Assessment & Plan Note (Signed)
Blood pressure is well controlled on current medications. 

## 2014-05-21 NOTE — Assessment & Plan Note (Signed)
She has no symptoms suggestive of angina. Continue medical therapy. I might consider discontinuing aspirin given that she is on warfarin.

## 2014-05-21 NOTE — Assessment & Plan Note (Signed)
She appears to be euvolemic on current dose of furosemide. She is on optimal medical therapy.

## 2014-05-21 NOTE — Assessment & Plan Note (Signed)
Atrial fibrillation was in the setting of cholecystitis. I think it's reasonable to take her off amiodarone and monitor for recurrent episodes. Currently, she is on anticoagulation with warfarin.

## 2014-06-08 DIAGNOSIS — R234 Changes in skin texture: Secondary | ICD-10-CM | POA: Diagnosis not present

## 2014-06-08 DIAGNOSIS — B351 Tinea unguium: Secondary | ICD-10-CM | POA: Diagnosis not present

## 2014-06-08 DIAGNOSIS — E1142 Type 2 diabetes mellitus with diabetic polyneuropathy: Secondary | ICD-10-CM | POA: Diagnosis not present

## 2014-06-08 DIAGNOSIS — Z8631 Personal history of diabetic foot ulcer: Secondary | ICD-10-CM | POA: Diagnosis not present

## 2014-06-18 ENCOUNTER — Ambulatory Visit (INDEPENDENT_AMBULATORY_CARE_PROVIDER_SITE_OTHER): Payer: Medicare Other

## 2014-06-18 DIAGNOSIS — I4891 Unspecified atrial fibrillation: Secondary | ICD-10-CM

## 2014-06-18 DIAGNOSIS — Z5181 Encounter for therapeutic drug level monitoring: Secondary | ICD-10-CM

## 2014-06-18 LAB — POCT INR: INR: 1.9

## 2014-06-30 ENCOUNTER — Other Ambulatory Visit: Payer: Self-pay | Admitting: *Deleted

## 2014-06-30 MED ORDER — WARFARIN SODIUM 3 MG PO TABS: ORAL_TABLET | ORAL | Status: DC

## 2014-06-30 NOTE — Telephone Encounter (Signed)
Sent in refill  times 1 month states wants to change coumadin from 3mg  to 6mg  on next visit in April

## 2014-07-10 DIAGNOSIS — E1165 Type 2 diabetes mellitus with hyperglycemia: Secondary | ICD-10-CM | POA: Diagnosis not present

## 2014-07-20 DIAGNOSIS — E7800 Pure hypercholesterolemia, unspecified: Secondary | ICD-10-CM | POA: Insufficient documentation

## 2014-07-20 DIAGNOSIS — I482 Chronic atrial fibrillation: Secondary | ICD-10-CM | POA: Diagnosis not present

## 2014-07-20 DIAGNOSIS — I10 Essential (primary) hypertension: Secondary | ICD-10-CM | POA: Diagnosis not present

## 2014-07-20 DIAGNOSIS — E1142 Type 2 diabetes mellitus with diabetic polyneuropathy: Secondary | ICD-10-CM | POA: Diagnosis not present

## 2014-07-22 ENCOUNTER — Ambulatory Visit (INDEPENDENT_AMBULATORY_CARE_PROVIDER_SITE_OTHER): Payer: Medicare Other

## 2014-07-22 DIAGNOSIS — I4891 Unspecified atrial fibrillation: Secondary | ICD-10-CM | POA: Diagnosis not present

## 2014-07-22 DIAGNOSIS — Z5181 Encounter for therapeutic drug level monitoring: Secondary | ICD-10-CM

## 2014-07-22 LAB — POCT INR: INR: 2

## 2014-07-22 MED ORDER — WARFARIN SODIUM 3 MG PO TABS: ORAL_TABLET | ORAL | Status: DC

## 2014-07-28 NOTE — Consult Note (Signed)
PATIENT NAME:  Meghan [name], Meghan [name] MR#:  [id number] DATE OF BIRTH:  1951-01-08  DATE OF CONSULTATION:  02/15/2012  REFERRING PHYSICIAN:  [facility] CONSULTING PHYSICIAN:  Algernon Huxley, MD  REASON FOR CONSULTATION: Left ICA stenosis and stroke.   HISTORY OF PRESENT ILLNESS: This is a 64 year old female who was admitted with significant right arm and leg weakness. This has improved over the past two days since her admission but is still present. She had very significant confusion which is better but not entirely. She does not seem to have any problems with her speech or swallowing, and she does not seem to have any visual changes. Her ultrasound suggested a moderate left-sided carotid artery stenosis, and we are consulted for further evaluation and treatment.   PAST MEDICAL HISTORY:  1. Diabetes mellitus.  2. Right leg infection.  PAST SURGICAL HISTORY: Right foot surgery for infection.   ALLERGIES: None.   HOME MEDICATIONS:   1. Aspirin daily.  2. Glipizide 5 mg b.i.d.  3. Lisinopril 10 mg daily. 4. Metformin 1000 mg b.i.d.   SOCIAL HISTORY: No alcohol or tobacco dependence.   FAMILY HISTORY: No heart disease or stroke, according to her. Positive for diabetes.   REVIEW OF SYSTEMS: GENERAL: No fevers or chills. No intentional weight loss or gain. EYES: No blurred or double vision. EARS: No tinnitus or severe pain. CARDIOVASCULAR: No chest pain or palpitations. RESPIRATORY: No shortness breath or cough. GI: No nausea, vomiting, or diarrhea. GU: No dysuria or hematuria. ENDOCRINE: No heat or cold intolerance. HEMATOLOGIC:  No anemia or easy bruising. SKIN: No new rashes or ulcers. NEUROLOGICAL:   As per history of present illness. PSYCHIATRIC: No anxiety or depression.   PHYSICAL EXAMINATION:  GENERAL: This is a well-developed, well-nourished white female who is sitting in bed not in apparent distress.   VITAL SIGNS: Temperature 97.6, pulse 79, blood pressure 111/76, saturations are 93% on room  air.   HEENT: Head: Normocephalic, atraumatic. Eyes: Sclerae are anicteric. Conjunctivae are clear. Ears: Normal external appearance. Hearing is intact. There may be a slight left facial droop.   HEART: Regular rate and rhythm without murmurs.   LUNGS: Clear. Carotids have good upstroke. There is a left carotid bruit present.   ABDOMEN: Soft, nondistended, nontender.   EXTREMITIES: Warm and well perfused. No cyanosis, clubbing, or edema. Strength: She has three out of five right upper extremity strength, five out of five left upper extremity strength. Her right lower extremity is four out of five strength distally and five out of five strength proximally.  The left lower extremity is five out of five strength.   NEUROLOGICAL: She seems to be mentating normally.   SKIN: Warm and dry.   LABORATORY, DIAGNOSTIC AND RADIOLOGICAL DATA: Sodium 141, potassium 4.0, chloride 106, CO2 25, BUN 21, creatinine 0.71, glucose 119. White blood cell count is 9.4, hemoglobin is 12.8, platelet count is 240,000. Ultrasound is as described above.   ASSESSMENT AND PLAN: A 64 year old white female with a left hemispheric stroke with right-sided weakness which is improving since discharge. I agree with antiplatelets. I would recommend a CT angiogram be evaluated for further interrogation. If this is clearly greater than 70% stenosis, she would benefit from left carotid intervention, and I will see her back tomorrow following the CT scan.    This is a level-4 consultation.  ____________________________ Algernon Huxley, MD jsd:cbb D: 03/05/2012 15:17:45 ET T: 03/05/2012 15:43:10 ET JOB#: [id number]  cc: Algernon Huxley, MD, <Dictator> Algernon Huxley MD  ELECTRONICALLY SIGNED 03/06/2012 9:44

## 2014-07-28 NOTE — Discharge Summary (Signed)
PATIENT NAME:  Meghan [name], Meghan [name] MR#:  [id number] DATE OF BIRTH:  Jun 16, 1950  DATE OF ADMISSION:  02/13/2012 DATE OF DISCHARGE:  02/16/2012  PRESENTING COMPLAINT: Right-sided arm and leg weakness.   DISCHARGE DIAGNOSES:  1. Acute CVA, left frontal lobe.  2. Hypertension.  3. Hyperlipidemia.  4. 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  5. Type II diabetes.  6. Severe chronic psoriasis.   CONDITION ON DISCHARGE: Fair.   CODE STATUS: FULL CODE.   MEDICATIONS:  1. Lisinopril 10 mg daily.  2. Metformin 1000 mg b.i.d.  3. Glipizide 10 mg b.i.d.  4. Pravachol 20 mg at bedtime.  5. Aspirin 325 p.o. daily.   DIET: 2 gram sodium, ADA 1800 calorie diet.   FOLLOW-UP:  1. Follow-up with Dr. Evorn Gong of Dermatology 02/16/2012 at 1:30 p.m.  2. Follow-up with Dr. Lucky Cowboy 03/15/2012 at 2 p.m.  3. Follow-up with Dr. Ellison Hughs at [facility] on November 20th at 2 p.m.   LABORATORY, DIAGNOSTIC, AND RADIOLOGICAL DATA: CT angiography of the neck showed ICA 75% stenosis.   Hemoglobin A1c 7.2. CBC within normal limits. Basic metabolic panel within normal limits. Lipid profile within normal limits except HDL of 31.   Echo Doppler showed EF of 45 to 50%. Transmitral spectral Doppler flow pattern is suggestive of impaired LV relaxation. There is severe apical wall hypokinesis. There is moderate anterior wall hypokinesis. Right ventricular systolic function is normal. Left atrium is mildly dilated. Right ventricular systolic function is normal.   CT of the head showed no abnormal focus of decreased density in the high left frontal lobe worrisome for an evolving ischemic event.   TSH 3.9.   CONSULTATIONS:  1. Neurology consultation with Dr. Jennings Books  2. Vascular consultation with Dr. Lucky Cowboy  3. Cardiology consultation with Dr. Fletcher Anon    BRIEF SUMMARY OF HOSPITAL COURSE: Meghan [name] is a 64 year old Caucasian female with past medical history of diabetes type II and hypertension who came in with:  1. Acute CVA. The  patient came in with right upper and lower extremity weakness. She was admitted on the medical floor. A CT of the head was suggestive of evolving ischemic event in the high left frontal lobe. The patient's symptoms improved over the hospital stay. She was seen by physical therapy, worked with them, ambulated by herself prior to discharge and her right upper extremity weakness improved remarkably as well. She was initially started on Aggrenox, however, at discharge she was given full dose aspirin. The patient does not have insurance and was not going to be able to afford Aggrenox in the long run. She did understand that. She refused to do MRI due to severe claustrophobia. The patient did not require any outpatient physical therapy. Neurology saw the patient for acute CVA and recommends continuing full dose aspirin.  2. Left proximal ICA carotid stenosis as noted on ultrasound carotids and CT angiography. Dr. Lucky Cowboy plans to do a left CEA down the road in the next several weeks. The patient has an appointment to follow-up with him.  3. Diabetes. Glipizide and metformin were continued.  4. Hypertension. Lisinopril was resumed.  5. Severe psoriasis. An appointment was set up with Dr. Evorn Gong at [facility] for today for her to be evaluated for psoriasis.   Hospital stay otherwise remained stable.   CODE STATUS: The patient remained a FULL CODE.   TIME SPENT: 40 minutes.   ____________________________ Hart Rochester Posey Pronto, MD sap:drc D: 02/16/2012 15:01:09 ET T: 02/17/2012 15:41:03 ET JOB#: [id number]  cc: Jerusalen Mateja  Hulda Humphrey, MD, <Dictator> Rayvon Char. Dasher, MD Algernon Huxley, MD Sofie Hartigan, MD Ilda Basset MD ELECTRONICALLY SIGNED 02/20/2012 1:25

## 2014-07-28 NOTE — Consult Note (Signed)
Brief Consult Note: Diagnosis: lt MCA, anterior division branch infract, right hemiparesis.   Patient was seen by consultant.   Consult note dictated.   Comments: lt MCA, anterior division branch infract, right hemiparesis, -was not a tPA candidate. - agree with stroke w/up. - was on ASA 81, switch to ASA 325 (long run she won't be able to afford more expensive meds) - on statin,  - aggresssive PT, OT, ST. - Multivitamins, flu shot, avoid fever, permissive HTN for peristroke period to avoid penumbra expension, look out for depression, GI, DVT prophylaxis. - f/up in 2-3 wks post discharge.  Electronic Signatures: Ray Church (MD)  (Signed [id number] 20:11)  Authored: Brief Consult Note   Last Updated: 05-Nov-13 20:11 by Ray Church (MD)

## 2014-07-28 NOTE — Op Note (Signed)
PATIENT NAME:  Meghan [name], Meghan [name] MR#:  [id number] DATE OF BIRTH:  July 24, 1950  DATE OF PROCEDURE:  03/06/2012  PREOPERATIVE DIAGNOSES:  1. High-grade left carotid artery stenosis with stroke three weeks ago.  2. Previous right foot debridement for gangrenous changes.  3. Diabetes.   POSTOPERATIVE DIAGNOSES:  1. High-grade left carotid artery stenosis with stroke three weeks ago.  2. Previous right foot debridement for gangrenous changes.  3. Diabetes.   PROCEDURE: Left carotid endarterectomy with CorMatrix arterial reconstruction.   SURGEON: Algernon Huxley, MD    ANESTHESIA: General.   ESTIMATED BLOOD LOSS: Approximately 50 mL.   INDICATION FOR PROCEDURE: This is a 64 year old white female who had a stroke approximately three weeks ago. She has recovered much of her deficits, although she still has mild weakness of her right arm and right leg as well as some sensory deficits of the right arm. She was found to have a high-grade left carotid artery stenosis and carotid endarterectomy is recommended for reduction of potential subsequent strokes. Risks and benefits were discussed. Informed consent was obtained.   DESCRIPTION OF PROCEDURE: The patient was brought to the operative suite and after an adequate level of general anesthesia was obtained, the left neck and chest were sterilely prepped and draped. She was placed in modified beach chair position and her head was flexed and turned to the right. After appropriate surgical time-out and intravenous antibiotics, incision was made along the anterior border of the sternocleidomastoid and we dissected down through platysma with electrocautery. Sternocleidomastoid was retracted laterally with Weitlaner retractors. Two crossing vein branches were ligated and divided between silk ties and the facial vein was ligated and divided between silk ties. This exposed the carotid artery which was dissected out. The proximal dissection zone was the common carotid artery  in the lower neck. This was encircled with vessel loops at this location. The external carotid artery and superior thyroid artery were then dissected out and encircled with vessel loops. The internal carotid artery was dissected out a couple of centimeters beyond the lesion and encircled with vessel loops. The patient was systemically heparinized with 6000 units of intravenous heparin for systemic anticoagulation. Control was pulled up on the vessel loops after this and circulated for five minutes and an anterior wall arteriotomy was created with an 11 blade and extended with Potts scissors. The Pruitt-Inahara shunt was put first in the internal carotid artery, flushed and de-aired, and then the common carotid artery flushed and de-aired, and then flow was restored. Approximately two minutes elapsed from clamp to restoration of blood flow and endarterectomy was performed in the usual fashion. The proximal endpoint was cut flush with tenotomy scissors. An eversion endarterectomy was performed on the external carotid artery and a nice feathered endpoint was created with blunt traction at the distal endpoint. The distal endpoint was tacked down with three 7-0 Prolene sutures. All loose flecks were removed and the vessel was locally heparinized. The CorMatrix extracellular patch was then used for arterial reconstruction. This was cut and beveled distally and the distal endpoint was started with a 6-0 Prolene suture, run approximately one-half the length of the arteriotomy both medially and laterally. The patch was then cut and beveled to an appropriate length proximally and a second 6-0 Prolene suture was started at the proximal endpoint. The medial suture line was run and tied down. The lateral suture line was run approximately one-quarter length of the arteriotomy. At this point the Pruitt-Inahara shunt was then removed. The vessel was  flushed in the internal, external, and common carotid arteries and then locally  heparinized. I then completed the arterial wall closure with Prolene flushing through the external carotid artery for several cardiac cycles prior to release of control. A single 6-0 Prolene patch suture was used for hemostasis and hemostasis was complete. Time from shunt removal to restoration of blood flow was two minutes. The wound was then irrigated. Evicyl topical hemostatic agent was placed and the wound was closed with three interrupted 3-0 Vicryl in the sternocleidomastoid space. Platysma was closed with a running 3-0 Vicryl. The skin was closed with 4-0 Monocryl. Sterile dressing was placed. The patient tolerated the procedure well and was taken to the recovery room in stable condition.   ____________________________ Algernon Huxley, MD jsd:drc D: 03/06/2012 18:06:27 ET T: 03/07/2012 10:31:42 ET JOB#: [id number]  cc: Algernon Huxley, MD, <Dictator> Algernon Huxley MD ELECTRONICALLY SIGNED 03/11/2012 9:22

## 2014-07-28 NOTE — Consult Note (Signed)
PATIENT NAME:  Meghan [name], Meghan [name] MR#:  [id number] DATE OF BIRTH:  [birth year]  DATE OF CONSULTATION:  02/15/2012  REFERRING PHYSICIAN:   CONSULTING PHYSICIAN:  Muhammad A. Fletcher Anon, MD  REASON FOR CONSULTATION: Abnormal echocardiogram.   HISTORY OF PRESENT ILLNESS: This is a pleasant 64 year old female with no previous cardiac history. She has known history of type 2 diabetes, hypertension, and tobacco use. She presented on 02/13/2012 with right-sided arm weakness for a few days. She was diagnosed with left MCA stroke. Her weakness is improving. She had an echocardiogram done which showed mildly decreased LV systolic function with an ejection fraction of 45% with severe hypokinesis of the distal anterior wall and apex. No LV thrombus was noted. The patient is not aware of any previous history of myocardial infarction. She denies any chest pain or dyspnea.   PAST MEDICAL HISTORY:  1. Type 2 diabetes.  2. Hypertension.   HOME MEDICATIONS:  1. Aspirin once a day.  2. Glipizide 5 mg twice daily.  3. Lisinopril 10 mg daily.  4. Metformin 1000 mg twice daily.   ALLERGIES: No known drug allergies.   SOCIAL HISTORY: Negative for current smoking, alcohol, or recreational drug use.   FAMILY HISTORY: Negative for premature coronary artery disease.   REVIEW OF SYSTEMS: A 10 point review of systems was performed. It is negative other than what is mentioned in the history of present illness.   PHYSICAL EXAMINATION:   GENERAL: The patient appears to be at her stated age and in no acute distress.   VITAL SIGNS: Temperature 97.6, pulse 79, respiratory rate 18, blood pressure 111/76, and oxygen saturation 93% on room air.   HEENT: Normocephalic, atraumatic.   NECK: No jugular venous distention or carotid bruits.   RESPIRATORY: Normal respiratory effort with no use of accessory muscles. Auscultation reveals normal breath sounds.   CARDIOVASCULAR: Normal PMI. Normal S1 and S2 with no gallops or murmurs.    ABDOMEN: Benign, nontender, and nondistended.   EXTREMITIES: No clubbing, cyanosis, or edema.   SKIN: Warm and dry with no rash.   PSYCHIATRIC: She is alert and oriented x3 with normal mood and affect.   LABS/DIAGNOSTIC DATA: Her cardiac enzymes were negative.   ECG showed sinus rhythm with evidence of old anteroseptal infarct.   IMPRESSION:  1. Acute ischemic stroke.  2. Possible old anteroapical infarct versus stress-induced cardiomyopathy related to stroke.  3. Type 2 diabetes.  4. Hypertension.   RECOMMENDATIONS: The patient's ECG and echocardiogram are both suggestive of prior  distal LAD infarct and less likely stress-induced cardiomyopathy. She is currently not having any symptoms suggestive of angina and denies any previous history of myocardial infarction. It is possible that she had a silent myocardial infarction in the past due to her history of diabetes. I recommend treating her as if she has underlying obstructive coronary artery disease with aspirin, statin, ACE inhibitor, and a beta blocker at some point. Her blood pressure is too low now to add a beta blocker, especially in the setting of acute stroke. I recommend close cardiology outpatient follow-up with plans for a nuclear stress test to evaluate for possible ischemia and see if the apical abnormality persists. There was no evidence of LV thrombus on the echocardiogram. Thus, continue aspirin for now. If subsequent study shows persistence of apical hypokinesis and akinesis, then short-term warfarin might be indicated.  ____________________________ Mertie Clause. Fletcher Anon, MD maa:slb D: 02/15/2012 11:07:32 ET T: 02/15/2012 12:50:23 ET JOB#: [id number]  cc: Rogue Jury A. Fletcher Anon, MD, <Dictator>  Wellington Hampshire MD ELECTRONICALLY SIGNED 03/04/2012 8:44

## 2014-07-28 NOTE — Discharge Summary (Signed)
PATIENT NAME:  Meghan [name], Meghan [name] MR#:  [id number] DATE OF BIRTH:  1951-03-21  DATE OF ADMISSION:  03/06/2012 DATE OF DISCHARGE:  03/07/2012  ADMITTING AND DISCHARGE DIAGNOSES:  1. Symptomatic left carotid artery stenosis.  2. Stroke approximately three weeks ago.  3. Hypertension.  4. Diabetes.   PROCEDURE PERFORMED WHILE IN THE HOSPITAL: Left carotid endarterectomy on 03/06/2012. For full details of that, please see the dictated operative summary.   BRIEF HISTORY: The patient is a 64 year old white female with a history of stroke approximately 3 to 4 weeks ago. This uncovered a high-grade left carotid artery stenosis and she was brought back for repair of this to reduce her risk of subsequent neurologic events.   HOSPITAL COURSE: The patient admitted through Same Day Surgery and taken to the operating room where a left carotid endarterectomy was performed. For full details of that, please see the dictated operative summary. The patient tolerated surgery well and was admitted to the CCU for overnight observation. She developed minimal neck swelling. She had well controlled heart rate and blood pressure and did not require IV drips after the immediate postoperative period. She was able to advance to a regular diet by postoperative day #1. She was able to void once her catheter was removed. Her pain control was adequate. She had no change in her neurologic exam with some mild baseline right arm and leg weakness from her previous stroke but did not worsen with surgery. On postop day #1 she was stable for discharge. She was discharged to home accompanied by her family.  DIET: Regular.   ACTIVITY: As tolerated.   FOLLOW-UP: Return appointment will be in 3 to 4 weeks in the office with carotid duplex.   DISCHARGE MEDICATIONS: She was given a prescription for:  1. Aspirin 81 mg daily. 2. Plavix 75 mg daily.  3. Norco as needed for pain. 4. Lipitor 10 mg daily. 5. Resume all her previous home  medications with the exception of the 325 aspirin which has been changed to 81 mg.  She will contact our office with any fevers greater than 101.1, redness, drainage, neurologic changes, ear pain, or other issues.   ____________________________ Algernon Huxley, MD jsd:drc D: 03/07/2012 11:08:42 ET T: 03/07/2012 15:47:37 ET JOB#: [id number]  cc: Algernon Huxley, MD, <Dictator> Algernon Huxley MD ELECTRONICALLY SIGNED 03/11/2012 9:22

## 2014-07-28 NOTE — Consult Note (Signed)
Asked to see patient regarding left sided carotid stenosis and stroke.  Symptoms are improving from admission, but still with some right sided weakness and confusion.  [place] suggests moderate left ICA stenosis.  Normal renal function.  Will check CT angiogram for further evaluation.   Electronic Signatures: Algernon Huxley (MD)  (Signed on 07-Nov-13 12:52)  Authored  Last Updated: 07-Nov-13 12:52 by Algernon Huxley (MD)

## 2014-07-28 NOTE — Consult Note (Signed)
PATIENT NAME:  Meghan [name], Meghan [name] MR#:  [id number] DATE OF BIRTH:  [birth year]  DATE OF CONSULTATION:  02/13/2012  REFERRING PHYSICIAN:  Dr. Dustin Flock  CONSULTING PHYSICIAN:  Hemang K. Manuella Ghazi, MD  REASON FOR CONSULTATION: Stroke.   HISTORY OF PRESENT ILLNESS: Ms. Meghan [name] is a 64 year old Caucasian female with history of hypertension, diabetes, smoking, acute onset of weakness of her right arm and leg without involvement of speech or face. She did not feel like she had any numbness. This came on very acutely on Sunday morning, 02/11/2012.    She did not have any head trauma or headache. She denied any transient ischemic attack like symptoms. She does have bilateral feet numbness.   Risk factor wise she has diabetes diagnosed around a year ago, so she is not aware of her hyperlipidemia. She denied current smoking but she used to smoke before.   Patient also snores but she is not aware of apneic spells.   She does not resuscitative snorting or early morning headaches or nocturia.   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 She denied any other hypercoagulable state. She did not have any deep vein thrombosis like symptoms.   PAST MEDICAL HISTORY:  1. Diabetes. 2. Right leg infection.   PAST SURGICAL HISTORY: Right foot surgery.   ALLERGIES: She does not have any known drug allergies.   MEDICATIONS: I reviewed her home medication list.   SOCIAL HISTORY: Her social history is significant that she does not smoke, does not drink alcohol, never had a DUI, does not do recreational drugs. She lives with her daughter.   FAMILY HISTORY: Significant for diabetes. Rest of the family history was unremarkable.    REVIEW OF SYSTEMS: Positive for right arm and leg weakness and inability to do function with it and inability to walk without assistance. Otherwise 10 system review of system was asked and was found to be negative.   PHYSICAL EXAMINATION:  VITAL SIGNS: Temperature 98.1, pulse 80, respiratory rate 20,  blood pressure 132/83, pulse oximetry 92%.   GENERAL: She is an elderly-looking Caucasian female surrounded by family members lying in bed, not in acute distress.   LUNGS: Clear to auscultation.   HEART: S1, S2 heart sounds. Carotid examination did not reveal any bruit.   Her funduscopic exam was attempted.   NEUROLOGIC: She is alert, oriented. She followed two-step inverted commands. I did not feel like she had a language deficit. Her naming, repetition, comprehension seems to be intact.   She does not have any neglect.   Her attention and concentration were appropriate for her age and medical condition.   She knew the current events.   On her cranial nerves, her pupils were equal, round, and reactive. Extraocular movements are intact. Her visual fields seem full. Her face was symmetric. Tongue was midline. Facial sensations were intact. Her hearing was okay.   On her motor exam she has 3/5 strength of her shoulder abduction. Her flexion and extension at the elbow seems to be 4-/5. She has cortical hand posture. She has 4-/5 strength of her grip.   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   She has good strength of her left upper extremity and left lower extremity.   In terms of her right lower extremity she has difficulty picking her right leg up and has decreased prosody of the movements of the right leg.   Her sensations were intact to light touch except her feet. Her deep tendon reflexes are symmetric except she has  decreased ankle jerk. She has a right upgoing toe.   I did not check her gait.    LABORATORY, DIAGNOSTIC AND RADIOLOGICAL DATA: I reviewed her labs and she does have a significantly elevated glucose. Hemodynamically she is okay. Her thyroid function test is okay.   Her hemoglobin A1c and lipid panel is pending at present.   ASSESSMENT AND PLAN: Stroke. I reviewed patient's CT scan of the head plus based on her history and exam her neuro imaging  is consistent with left MCA anterior division infarct likely moving supplementary motor area and premotor area.   This seems like branch vessel infarct likely from her multiple uncontrolled primary risk factors.   The patient mentioned she does not go to the PCP quite often and her diabetes might not be optimally controlled.   Patient should be evaluated for sleep apnea in future as an outpatient.   She should have good control of her diabetes. Right now her blood pressure is okay.    At home she was taking aspirin 81 mg p.o. daily, which we can increase to 325 mg p.o. daily.   I do not think she will be able to afford Aggrenox in long run.   Patient should get a statin.   Patient should have a swallow evaluation which has already been done per patient.   Patient should have aggressive physical therapy and occupational therapy. I talked to the patient about various strategies and gave her a reference of [email or web address].   Patient was not a candidate for TPA this time but I talked to the patient about signs and symptoms of stroke and importance of calling 911 so she can be a candidate for TPA in future.   I talked to her about post stroke depression. In recent studies multivitamin has shown to help reduce recurrent stroke risk likely by reducing homocysteine level. Flu shot has shown to reduce chance of recurrent stroke in elderly people.   Patient should get deep vein thrombosis prophylaxis as she is getting.   We should avoid any hyperthermia (fever which can worsen neuron recovery after ischemia).   I discussed with patient's family member above-mentioned findings as well. I will follow this patient in the hospital if she is here or I will just see her back as an outpatient in 2 to 3 weeks. Feel free to contact me with any further questions.  ____________________________ Royetta Crochet. Manuella Ghazi, MD hks:cms D: 02/13/2012 20:21:38 ET T: 02/14/2012 08:56:45 ET JOB#: [id number]  cc: Hemang K. Manuella Ghazi,  MD, <Dictator> Royetta Crochet [facility] MD ELECTRONICALLY SIGNED 02/15/2012 7:36

## 2014-07-28 NOTE — Consult Note (Signed)
Brief Consult Note: Diagnosis: Possible old anteroapical infarct vs. stress induced cardiomyopathy in setting of stroke.   Patient was seen by consultant.   Consult note dictated.   Comments: No symptoms suggestive of angina.  Treat as if underlying CAD with Aspirin, Statin, ACE I and at some point a beta blocker (blood pressure is too low now to add that).  I recommend an outpatient nuclear stress test.  Electronic Signatures: Kathlyn Sacramento (MD)  (Signed [phone] 11:01)  Authored: Brief Consult Note   Last Updated: 07-Nov-13 11:01 by Kathlyn Sacramento (MD)

## 2014-07-28 NOTE — H&P (Signed)
PATIENT NAME:  Meghan [name], Meghan [name] MR#:  [id number] DATE OF BIRTH:  29-Dec-1950  DATE OF ADMISSION:  02/13/2012  PRIMARY CARE PROVIDER: Dr. Ellison Hughs   ED REFERRING DOCTOR: Dr. Thomasene Lot    CHIEF COMPLAINT: Right-sided arm and right leg weakness.   HISTORY OF PRESENT ILLNESS: The patient is a 64 year old white female with history of hypertension, diabetes type II, and history of smoking who noticed that she started having weakness on the right side of the arm and right leg for the past few days. She feels that it is likely three days. She reports that every time she tried to lift her arm it would feel heavy and she was having difficulty with raising her arms above certain distance above gravity. Her leg also felt weak. She reports that her symptoms have improved. She does not have any problem with her speech. She does not have any difficulty with swallowing. Denies any visual difficulties. She has chronic numbness in her right foot which is unchanged. Otherwise, she denies any fevers, chills. No chest pains, palpitations. No shortness of breath.   PAST MEDICAL HISTORY:  1. History of diabetes type II.  2. History of right leg infection.   PAST SURGICAL HISTORY: History of surgery to the right foot for an infection.   ALLERGIES: None.   MEDICATIONS: The patient reports that she is taking:  1. Aspirin 1 tab p.o. daily.  2. Glipizide 5 mg 1 tab p.o. b.i.d.  3. Lisinopril 10 daily.  4. Metformin 1000 one tab p.o. b.i.d.   SOCIAL HISTORY: Does not smoke. Does not drink. No drugs. Lives with her daughter.   FAMILY HISTORY: There is no heart disease in the family or stroke according to her. There is diabetes in the family.   REVIEW OF SYSTEMS: CONSTITUTIONAL: Denies any fevers. Complains of fatigue, weakness involving the right side of her body. Denies any weight loss or weight gain. EYES: No blurred or double vision. No pain. No redness. No inflammation. No glaucoma. No cataracts. ENT: No tinnitus. No  ear pain. No hearing loss. No seasonal or year round allergies. No epistaxis. No nasal discharge. No difficulty swallowing. RESPIRATORY: Denies any cough, wheezing. No hemoptysis. No dyspnea. CARDIOVASCULAR: Denies any chest pain, orthopnea. No edema. No arrhythmia. No palpitations. No syncope. GI: No nausea, vomiting, diarrhea. No abdominal pain. No hematemesis. No melena. No changes in bowel habits. GU: Denies any dysuria, hematuria, renal colic, or frequency. ENDOCRINE: Denies any polyuria, nocturia, thyroid problems. HEME/LYMPH: Denies anemia, easy bruisability, or bleeding. SKIN: No acne. No rash. No changes in mole, hair or skin. MUSCULOSKELETAL: Denies any pain in the neck, back, or shoulder. NEUROLOGIC: Complains of chronic right foot numbness, right upper extremity, right lower extremity weakness as above. No previous history of CVA, TIA, or seizures. PSYCHIATRIC: No anxiety. No insomnia. No ADD.   PHYSICAL EXAMINATION:   VITAL SIGNS: Temperature 97.8, pulse 85, respirations 18, blood pressure 145/87, O2 93%.   GENERAL: The patient is a 64 year old white female who is moderately obese currently not in any acute distress.   HEENT: Head atraumatic, normocephalic. Pupils equally round, reactive to light and accommodation. Extraocular movements intact. There is no conjunctival pallor. No scleral icterus. Nasal exam shows no drainage or ulceration. Oropharynx is clear without any exudate.   NECK: No thyromegaly. No carotid bruits.   CARDIOVASCULAR: Regular rate and rhythm. No murmurs, rubs, clicks, or gallops. PMI is not displaced.   LUNGS: Clear to auscultation bilaterally without any rales, rhonchi, or wheezing.  ABDOMEN: Soft, nontender, nondistended. Positive bowel sounds x4.   EXTREMITIES: No clubbing, cyanosis, or edema.   SKIN: No rash.   LYMPHATICS: No lymph nodes palpable.   VASCULAR: Good DP, PT pulses.   PSYCHIATRIC: Not anxious or depressed.   NEUROLOGIC: The patient is  awake, alert, oriented x3. No speech deficits. Cranial nerves II through XII grossly intact. There may be a very minimal left-sided facial droop. Sensation intact in the face. Strength on the left side is 5 out of 5 in upper and lower extremities. On the right side she has 4 out of 5 strength in the right upper and right lower extremity. Babinski is downgoing. Reflexes 2+.  EVALUATIONS: CT scan of the head shows abnormal focus of decreased density in the high left frontal lobe worrisome for evolving ischemic event. No hemorrhage.   BMP glucose 292, BUN 20, creatinine 0.83, sodium 139, potassium 3.8, chloride 104, CO2 26. LFTs are normal except for total protein of 9.8. WBC 8.3, hemoglobin 14.9, platelet count 260. Troponin less than 0.02. INR 0.9. Urinalysis leukocytes 2+, WBCs 16.   EKG normal sinus rhythm without any ST-T wave changes.   ASSESSMENT AND PLAN: The patient is a 64 year old white female with history of diabetes and hypertension who presents with right-sided upper and lower extremity weakness for three days. CT shows left-sided CVA.  1. Acute CVA, duration likely greater than three days, was on aspirin. I'll go ahead and change it to Aggrenox. Will place her on Lipitor. Will get carotids, echo. CT shows evolving CVA. Will get MRI of the brain to evaluate for extent of the CVA. Will get PT, OT evaluation as well as Neurology evaluation. Start on lipid lowering regimen. Check a fasting lipid panel in the a.m.  2. Diabetes. Will continue glipizide, metformin. Will check a hemoglobin A1c. Place her on sliding scale.  3. Hypertension. Will continue lisinopril.  4. Abnormal urinalysis with possible urinary tract infection. Will place her on Cipro. Get urine cultures.  5. Miscellaneous. Will place her on Lovenox for DVT prophylaxis.    TIME SPENT: 40 minutes.   ____________________________ Lafonda Mosses Posey Pronto, MD shp:drc D: 02/13/2012 14:10:33 ET T: 02/13/2012 14:34:13  ET JOB#: [id number]  cc: Mescal Flinchbaugh H. Posey Pronto, MD, <Dictator> Alric Seton MD ELECTRONICALLY SIGNED 02/13/2012 17:29

## 2014-08-01 NOTE — Consult Note (Signed)
PATIENT NAME:  Meghan [name] MR#:  [id number] DATE OF BIRTH:  January 20, 1951  DATE OF ADMISSION: 06/30/2013 DATE OF CONSULTATION:  06/30/2013  REASON FOR CONSULTATION: Nausea, vomiting, possible infected gallstone on ultrasound.   HISTORY OF PRESENT ILLNESS: Meghan [name] is a pleasant 64 year old female with history of diabetes, hypertension, peripheral vascular disease, history of CVA status post carotid endarterectomy, who is admitted following 1 week of nausea and vomiting. She has no pain. She has had a work-up in the Emergency Room and found to have a urinary tract infection and elevated blood glucose.   Since she has been here, she has been doing fine and has been tolerating clears; however, when advanced to a regular diet, states she had nausea, vomiting, and diarrhea. She is noted to have a white cell count of 16,000 on admission. Also an ultrasound shows gallstones and what looks like a possible obstructing stone at the neck of the gallbladder.   Denies having sick contacts but says that she has 3 children. She lives at home, lives with her daughter, and somebody is always sick. No fevers, chills, night sweats, shortness of breath, cough, chest pain, abdominal pain, dysuria, hematuria.   PAST MEDICAL HISTORY: Hypertension, diabetes mellitus, history CVA, history of carotid endarterectomy.   ALLERGIES: No known drug allergies.   HOME MEDICATIONS: Aspirin, fish oil, and Plavix.   SOCIAL HISTORY: No tobacco or alcohol use. Lives with daughter and children.   FAMILY HISTORY: Diabetes, heart problems.   REVIEW OF SYSTEMS: A 12-point review of systems is obtained. Pertinent positives and negatives are as above.   PHYSICAL EXAMINATION:  VITAL SIGNS: Temperature 98.1, pulse 142, blood pressure 108/69, respirations 20, 95% on 2 liters.  GENERAL: No acute distress. Alert and oriented x3.  HEAD: Normocephalic, atraumatic.  FACE: No obvious facial trauma. Normal external nose. Normal external ears.   CHEST: Lungs clear to auscultation. Moving air well.  HEART: Regular rate and rhythm. No murmurs, rubs, or gallops.  ABDOMEN: Soft, nontender, nondistended.  EXTREMITIES: Moves all extremities well. Strength five out of five.  NEUROLOGIC: Cranial nerves II through XII grossly intact.Sensation intact all four extremities.   LABORATORIES: At admission, white cell count of 16.5, currently 9.7. Neutrophils at admission were 90, currently 74%. Potassium is 3.0. Creatinine is 0.59. LFTs were normal at admission. Urinalysis showed 3+ leukocyte esterase, 48 white cells with 6 epithelial cells, mixed bacteria suggestive of contamination.   Ultrasound shows right upper quadrant, shows gallbladder with an impacted stone in neck, mild thickening. No Murphy sign.   ASSESSMENT AND PLAN: Meghan [name] is a pleasant 64 year old female who presents with  nausea and vomiting x1 week. She does have an impacted stone; however, it is unusual for hydrops or cholecystitis to present without pain. Agree with HIDA scan. If HIDA positive, we will discuss cholecystectomy. If negative, would recommend a potential CT scan. The symptoms also consistent with gastroenteritis.   ____________________________ Glena Norfolk. Afnan Cadiente, MD cal:np D: 06/30/2013 16:46:52 ET T: 06/30/2013 17:39:26 ET JOB#: [id number]  cc: Harrell Gave A. Mattie Nordell, MD, <Dictator> Floyde Parkins MD ELECTRONICALLY SIGNED 07/09/2013 12:28

## 2014-08-01 NOTE — Consult Note (Signed)
Brief Consult Note: Diagnosis: Acute nausea/vomiting without abd pain or diarrhea. Positive heavy NSAID use, no hx of EGD/colon. Positive abnormal gallbladder.   Patient was seen by consultant.   Consult note dictated.   Comments: HIDA in am. Patient ate a regular lunch.  HR 140's- now and she is asx.   EKG ordered and metoprolol ordered by her attending. Cardiolgy consult and surgical consult pending. If HIDA is normal- will need to consider EGD when clinically feasible. IV ppi q 12 hr. Cont antibiotics. Avoid NSAIDs. This case d/w Dr. Gustavo Lah in collaboration of care.  Electronic Signatures: Gershon Mussel (NP)  (Signed 23-Mar-15 16:17)  Authored: Brief Consult Note   Last Updated: 23-Mar-15 16:17 by Gershon Mussel (NP)

## 2014-08-01 NOTE — Consult Note (Signed)
Chief Complaint:  Subjective/Chief Complaint Asked to see patient by Dr Rexene Edison.  S/P [facility] for acute supprative cholecystitis.  Patietn noted to have bilious JP drainage late yesterday adn today.  No fever, patient denies abdominal pain unless examined.   VITAL SIGNS/ANCILLARY NOTES: **Vital Signs.:   29-Mar-15 00:20  Temperature Temperature (F) 98.1    04:00  Temperature Temperature (F) 98.1    08:00  Temperature Temperature (F) 98.3    12:00  Temperature Temperature (F) 97.9    16:29  Vital Signs Type Q 4hr  Temperature Temperature (F) 98.1  Celsius 36.7  Temperature Source oral  Pulse Pulse 96  Respirations Respirations 18  Systolic BP Systolic BP 294  Diastolic BP (mmHg) Diastolic BP (mmHg) 64  Mean BP 76  Pulse Ox % Pulse Ox % 96  Pulse Ox Activity Level  At rest  Oxygen Delivery 3L; Nasal Cannula  Pulse Ox Heart Rate 92   Brief Assessment:  Cardiac Irregular   Respiratory clear BS   Gastrointestinal details normal Soft  bowel sounds positive, minimal distension, mild /appropriate discomfort to palpation in the right abdomen.   Lab Results: Hepatic:  28-Mar-15 07:02   SGPT (ALT) 14  Routine Chem:  29-Mar-15 04:34   Glucose, Serum  219  BUN 11  Creatinine (comp) 0.75  Sodium, Serum  133  Potassium, Serum 3.6  Chloride, Serum 99  CO2, Serum 28  Calcium (Total), Serum  8.0  Anion Gap  6  Osmolality (calc) 272  eGFR (African American) >60  eGFR (Non-African American) >60 (eGFR values <92m/min/1.73 m2 may be an indication of chronic kidney disease (CKD). Calculated eGFR is useful in patients with stable renal function. The eGFR calculation will not be reliable in acutely ill patients when serum creatinine is changing rapidly. It is not useful in  patients on dialysis. The eGFR calculation may not be applicable to patients at the low and high extremes of body sizes, pregnant women, and vegetarians.)  Routine Coag:  29-Mar-15 04:34   Activated PTT  (APTT)  81.6 (A HCT value >55% may artifactually increase the APTT. In one study, the increase was an average of 19%. Reference: "Effect on Routine and Special Coagulation Testing Values of Citrate Anticoagulant Adjustment in Patients with High HCT Values." American Journal of Clinical Pathology 2006;126:400-405.)  Routine Hem:  29-Mar-15 04:34   WBC (CBC)  16.7  RBC (CBC)  3.52  Hemoglobin (CBC)  10.2  Hematocrit (CBC)  31.5  Platelet Count (CBC) 337  MCV 89  MCH 29.0  MCHC 32.4  RDW 14.2  Neutrophil % 86.7  Lymphocyte % 7.4  Monocyte % 5.1  Eosinophil % 0.2  Basophil % 0.6  Neutrophil #  14.5  Lymphocyte # 1.2  Monocyte # 0.9  Eosinophil # 0.0  Basophil # 0.1 (Result(s) reported on 06 Jul 2013 at 04:58AM.)   Radiology Results: XRay:    28-Mar-15 10:42, Chest Portable Single View  Chest Portable Single View   REASON FOR EXAM:    sob. hypoxia  COMMENTS:       PROCEDURE: DXR - DXR PORTABLE CHEST SINGLE VIEW  - Jul 05 2013 10:42AM     CLINICAL DATA:  Hypoxia, shortness of breath.    EXAM:  PORTABLE CHEST - 1 VIEW    COMPARISON:  06/09/2013    FINDINGS:  Cardiomegaly with vascular congestion. Low lung volumes. Left lower  lobe atelectasis or infiltrate, new since prior study. There may be  a small associated left effusion.  No acute bony abnormality.     IMPRESSION:  Cardiomegaly with vascular congestion.    Left lower lobe atelectasis or infiltrate. Suspect small left  effusion.      Electronically Signed    By: Rolm Baptise M.D.    On: 07/05/2013 14:23     Verified By: Raelyn Number, M.D.,  Cardiology:    24-Mar-15 21:16, Echo Doppler  Echo Doppler   REASON FOR EXAM:      COMMENTS:       PROCEDURE: [place] - ECHO DOPPLER COMPLETE(TRANSTHOR)  - Jul 01 2013  9:16PM     RESULT: Echocardiogram Report    Patient Name:   ROBIE OATS Date of Exam: 07/01/2013  Medical Rec #:  [id number]      Custom1:  Date of Birth:  07/12/1950    Height:       67.0  in  Patient Age:    33 years    Weight:       221.0 lb  Patient Gender: F           BSA:          2.11 m??    Indications: Atrial Fib  Sonographer:    Janalee Dane RCS  Referring Phys: Monica Becton    Summary:   1. Rhythm appears to be atrial fibrillation.   2. Moderately dilated left atrium.   3. Left ventricular ejection fraction, by visual estimation, is 35 to   40%.   4. Moderately decreased global left ventricular systolic function.   5. Mild left ventricular hypertrophy.   6. Gloabl hypokinesis with no clear focal wall motion abnormalities.   7. Normal right ventricular size and systolic function.   8. Mild tricuspid regurgitation.   9. Mild to moderate mitral valve regurgitation.  10. Mildly elevated pulmonary artery systolic pressure.  2D AND M-MODE MEASUREMENTS (normal ranges within parentheses):  Left Ventricle:          Normal  IVSd (2D):      0.99 cm (0.7-1.1)  LVPWd (2D):     1.03 cm (0.7-1.1) Aorta/LA:                  Normal  LVIDd(2D):     4.79 cm (3.4-5.7) Aortic Root (2D): 3.50 cm (2.4-3.7)  LVIDs (2D):     3.71 cm           Left Atrium (2D): 5.50 cm (1.9-4.0)  LV FS (2D):     22.5 %   (>25%)  LV EF (2D):     45.3 %   (>50%)                                    Right Ventricle:                                    RVd (2D):        3.40 cm  SPECTRAL DOPPLER ANALYSIS (where applicable):  Tricuspid Valve and PA/RV Systolic Pressure: TR Max Velocity: 2.76 m/s RA   Pressure: 15 mmHg RVSP/PASP: 45.4 mmHg    PHYSICIAN INTERPRETATION:  Left Ventricle: The left ventricular internal cavity size was normal. LV     posterior wall thickness was normal. Mild left ventricular hypertrophy.   Global LV systolic function was moderately decreased. Left ventricular   ejection fraction, by visual  estimation, is 35 to 40%.  Right Ventricle: Normal right ventricular size, wall thickness, and   systolic function. The right ventricular size is normal. Global RV   systolic  function is normal.  Left Atrium: The left atrium is moderately dilated.  Right Atrium: The right atrium is normal in size.  Pericardium: There is no evidence of pericardial effusion.  Mitral Valve: The mitral valve is normal in structure. Mild to moderate   mitral valve regurgitation is seen.  Tricuspid Valve: The tricuspid valve is normal. Mild tricuspid   regurgitation is visualized. The tricuspid regurgitant velocity is 2.76   m/s, and with an assumed right atrial pressure of 15 mmHg, the estimated   right ventricular systolic pressure is mildly elevated at 45.4 mmHg.  Aortic Valve: The aortic valve is structurally normal, with no evidence   of sclerosis or stenosis. No evidence of aortic valve regurgitation is   seen.  Pulmonic Valve: The pulmonic valve is normal. Trace pulmonic valve   regurgitation.  Aorta: The aortic root and ascending aorta are structurally normal, with   no evidence of dilitation.    [id number] Ida Rogue MD  Electronically signed by [id number] Ida Rogue MD  Signature Date/Time: 07/02/2013/9:25:52 AM    *** Final ***    IMPRESSION: .    Verified By: Minna Merritts, M.D., MD  Nuclear Med:    24-Mar-15 10:58, Hepatobiliary Image - Nuc Med  Hepatobiliary Image - Nuc Med   REASON FOR EXAM:    impacted gall stone  COMMENTS:       PROCEDURE: NM  - NM HEPATOBILIARY IMAGE  - Jul 01 2013 10:58AM     CLINICAL DATA:  Impacted gallstone.    EXAM:  NUCLEAR MEDICINE HEPATOBILIARY IMAGING    TECHNIQUE:  Sequential images of the abdomen were obtained out to 60 minutes  following intravenous administration of radiopharmaceutical.    RADIOPHARMACEUTICALS:  8.18 mCi Tc-64mCholetec  COMPARISON:  Ultrasound of June 30, 2013.    FINDINGS:  Normal uptake within the hepatic parenchyma is noted. Normal filling  of small bowel is seen within 20 min. However, no definite uptake  within the gallbladder is seen 90 min after isotope administration.      IMPRESSION:  No definite filling of gallbladder lumen is seen within 90 min,  whichis concerning for cystic duct obstruction related to acute  cholecystitis.      Electronically Signed    By: JSabino DickM.D.    On: 07/01/2013 11:02         Verified By: JMarveen Reeks M.D.,   Assessment/Plan:  Assessment/Plan:  Assessment 1) patient with gallstone impacted in the gallbladder, acute supprative cholecystitis.  s/p [facility].  some increased bile drainage in JP.  although there is the possiblility of a bile leak, small such as a duct of lushka is most likely.  Patietn with no/minimal evidence of bile peritonitis.  2) atrial fibrillation, rate controlled.   Plan 1) recommend HIDA.  continue current.  if positive would consider MRCP before ERCP.  discussed with Dr LRexene Edison   Electronic Signatures: SLoistine Simas(MD)  (Signed 29-Mar-15 17:30)  Authored: Chief Complaint, VITAL SIGNS/ANCILLARY NOTES, Brief Assessment, Lab Results, Radiology Results, Assessment/Plan   Last Updated: 29-Mar-15 17:30 by SLoistine Simas(MD)

## 2014-08-01 NOTE — Op Note (Signed)
PATIENT NAME:  Meghan [name], Meghan [name] MR#:  [id number] DATE OF BIRTH:  1951/04/08  DATE OF PROCEDURE:  07/04/2013  PREOPERATIVE DIAGNOSIS: Acute cholecystitis.   POSTOPERATIVE DIAGNOSIS: Acute purulent cholecystitis with a probable rupture.   PROCEDURE PERFORMED: Laparoscopic cholecystectomy.   ESTIMATED BLOOD LOSS: 410 mL.   COMPLICATIONS: None.   SPECIMENS: Gallbladder.   COMPLICATIONS: None.   INDICATION FOR SURGERY: Meghan [name] is a pleasant 64 year old female who presented with nausea, vomiting, and hydrops on the HIDA scan which showed positive for cholecystitis. She was brought to the operating room for laparoscopic cholecystectomy.   DETAILS OF PROCEDURE: Informed consent was obtained. Meghan [name] was brought to the operating room suite. She was induced. Endotracheal tube was placed. General anesthesia was administered. Her abdomen was then prepped and draped in a standard surgical fashion. A timeout was then performed correctly identifying the patient and operative site and procedure to be performed. A supraumbilical incision was made and deepened down to the fascia.  The fasica was incised and the peritoneum was entered. Peritoneum was entered. Two stay sutures were placed through the fasciotomy. A Hassan trocar was placed in the abdomen window and was insufflated. It was placed through the fasciotomy. There were a large amount of adhesions to the gallbladder and the right upper quadrant. An 11-mm epigastric, two 5-mm subcostal trocars were placed in the clavicular and anterior axillary line. I then proceeded to painstakingly peel off omentum and colon off the gallbladder. This was quite bloody and in addition, there were also large omental blood vessels which had to be painstakingly removed. Clips were occasionally used for hemostasis. As the gallbladder was cleared off, the duodenum was stuck to bottom of the gallbladder and there was a thick rind. This was slowly removed. There was a small area in the  fundus that was necrotic which had torn and there was a large amount of purulence, which came from the gallbladder. After peeling off the duodenum there was what looked like a perforation in the gallbladder wall that was something that was not created with the surgery or there was a gallstone, potentially necrosis and perforation. This gallstone was removed and later taken out. I then proceeded to dissect out the infundibulum. There was a structure, which was cleared out and a critical view was obtained. The common duct was easily visualized with the structure coming off it. Due to the shortness of this and the overall integrity of the gallbladder, a laparoscopic stapler was used to ligate the bile duct and artery which was adherent to the bile duct. The gallbladder was then painstakingly taken with a relatively nonexistent plain. Part of the back wall of the gallbladder was left and later cauterized to prevent mucocele production. There were multiple areas on getting the gallbladder where small blood vessels were adherent to the gallbladder and needed to be clipped. After the gallbladder was taken away, it was taken out with an EndoCatch bag. A drain was placed in the right lower quadrant port. The abdomen was irrigated with approximately 3 liters of normal saline due to the purulence and the amount of blood. The clot was all removed and after I was happy with hemostasis the abdomen was desufflated and port sites were closed. The supraumbilical port site was closed with a figure-of-8, zero Vicryl. The 4-0 Monocryl was closed with 0 Vicryl. Deep dermal Steri-Strips, Telfa gauze, and Tegaderm were used to close the skin. Trace of Telfa gauze and Tegaderm were used to complete the dressing. The patient was  then awoken, extubated, and brought to the postanesthesia care unit. The patient was then awoken, extubated and brought to the postanesthesia care unit. Needle, sponge, and instrument counts were correct at the end  of the procedure. There were no immediate complications.    ____________________________ Glena Norfolk Aminta Sakurai, MD cal:lt/am D: 07/04/2013 17:33:50 ET T: 07/04/2013 23:42:15 ET JOB#: [id number]  cc: Harrell Gave A. Collier Bohnet, MD, <Dictator> Floyde Parkins MD ELECTRONICALLY SIGNED 07/09/2013 12:29

## 2014-08-01 NOTE — H&P (Signed)
PATIENT NAME:  Meghan [name], Meghan [name] MR#:  [id number] DATE OF BIRTH:  1950/04/30  DATE OF ADMISSION:  06/28/2013  PRIMARY CARE PHYSICIAN: None.   REFERRING PHYSICIAN: Dr. Lurline Hare.   CHIEF COMPLAINT: Nausea, vomiting.   HISTORY OF PRESENT ILLNESS: Meghan [name] is a 64 year old female with known history of diabetes mellitus, hypertension, peripheral vascular disease, status post left carotid endarterectomy, previous history of CVA, has been experiencing nausea and vomiting for the last one week. The smell of the food causes her to have vomiting. For the last one week unable to keep down any food. Denies having any abdominal pain. Concerning this, came to the Emergency Department. Work-up in the Emergency Department, the patient is found to have a urinary tract infection, and elevated blood glucose of 374. The patient states has not been taking her diabetic medication secondary to financial reasons for the last few months The patient is also found to have elevated white blood cell count of 16,00. One set of cardiac enzymes are negative, The patient denies having any fever. Denies having any diarrhea. Denies having any sick contacts.   PAST MEDICAL HISTORY: 1. Hypertension.  2. Diabetes mellitus. The patient is supposed to take metformin.  3. Previous history of CVA.  4. Carotid endarterectomy.   ALLERGIES: No known drug allergies.   HOME MEDICATIONS: The patient is currently taking only aspirin and fish oil.  SOCIAL HISTORY: Smoked remotely, quit many years back. Denies drinking alcohol or using illicit drugs. Lives with her daughter.   FAMILY HISTORY: Diabetes mellitus. Mother with heart problems.   REVIEW OF SYSTEMS: CONSTITUTIONAL: Experiencing generalized weakness.  EYES: No change in vision.  ENT: No change in hearing.  RESPIRATORY: No cough, shortness of breath.  CARDIOVASCULAR: No chest pain, palpations.  GASTROINTESTINAL: Has nausea and vomiting.  GENITOURINARY: No dysuria or hematuria.   ENDOCRINE: Has polyuria, polydipsia.  HEMATOLOGIC: No easy bruising or bleeding.  SKIN: No rash or lesions.  MUSCULOSKELETAL: No joint pains and aches.  NEUROLOGIC: No weakness or numbness in any part of the body.   PHYSICAL EXAMINATION: GENERAL: This is a well-developed age-appropriate female lying down in the bed, not in distress.  VITAL SIGNS: Temperature 98.1, pulse 88, blood pressure 146/72, respiratory rate of 18, oxygen saturation is 98% on room air.  HEENT: Head normocephalic, atraumatic. There is no scleral icterus. Conjunctivae normal. Pupils equal and react to light. Extraocular movements are intact. Mucous membranes moist. No pharyngeal erythema.  NECK: Supple. No lymphadenopathy. No JVD. No carotid bruit.  CHEST: Has no focal tenderness. LUNGS:  Bilateral clear to auscultation.  HEART: S1 and S2 regular. No murmurs are heard.  ABDOMEN: Bowel sounds present. Soft. nontender, nondistended. Could not appreciate any hepatosplenomegaly.  EXTREMITIES: No pedal edema. Pulses 2+.  NEUROLOGIC: The patient is alert, oriented to place, person and time. Cranial nerves II through XII intact. No motor and sensory deficits.   LABORATORY DATA: CBC: WBC of 16.5, hemoglobin 14, platelet count of 210. Complete metabolic panel: Glucose [id number], BUN 21, creatinine of 0.86. The rest of the values are within  normal limits.   Urinalysis, 3+ leukocyte esterase, 1+ bacteria, 46 WBC.   ASSESSMENT AND PLAN: Meghan [name] is a 64 year old female who comes to the Emergency Department with nausea, vomiting.  1. Nausea and vomiting, differential diagnosis gastroparesis versus other acute abdominal process like cholelithiasis. However, the patient does not have any abdominal pain, does not have any elevated LFTs. right upper quadrant ultrasound done. Keep the patient n.p.o., continue with IV  fluids. Provide antinausea medication.  2. Dehydration secondary to nausea and vomiting. Continue with the IV fluids.  3.  Urinary tract infection: Obtain urine cultures. Continue with Rocephin and follow up with the cultures.  4. Hypoglycemia: The patient has been off of her medications secondary to financial reasons,  consulting case management for assistance. Keep the patient on sliding scale insulin. Start back on metformin.  5. Diabetes mellitus, poorly controlled secondary to noncompliance.  6. Keep the patient on deep vein thrombosis prophylaxis with Lovenox.   TIME SPENT: 50 minutes.    ____________________________ Monica Becton, MD pv:sg D: 06/29/2013 07:09:50 ET T: 06/29/2013 07:53:48 ET JOB#: [id number]  cc: Monica Becton, MD, <Dictator> Monica Becton MD ELECTRONICALLY SIGNED 07/24/2013 0:24

## 2014-08-01 NOTE — Consult Note (Signed)
General Aspect Meghan [name] is a 64yo Caucasian female w/ PMHx s/f ischemic cardiomyopathy (EF 40%), presumed CAD, DM2, HTN, HLD, carotid artery dz (s/p L CEA), h/o CVA in this setting, obesity and noncompliance due to financial obstacles who was admitted to [facility] yesterday for persistent nausea and vomitting. She converted to a-fib w/ RVR (no prior diagnosis of this) this AM for which cardiology is consulted.  She was evaluated in 2013 initially in the setting of acute CVA. A 2D echo was performed revealing EF 45-50% and anterior HK, severe apical HK, impaired LV relaxation, mild LA dilatation noted at that time. She subsequent underwent Lexiscan Myoview confirming prior anterior infarct, no evidence of ischemia. EF 41%. She was treated medically with ASA, BB, statin. She followed up 04/2012. No anginal or CHF type symptoms. Declined outpatient cath.   She reports experiencing persistent nausea and vomiting x 1 week. She denies fevers, chills or abdominal pain. No hematemesis, coffee ground emesis, bloody stools. No falls at home. No weakness, numbness, slurred speech or facial droop. Denies chest pain, DOE/SOB, PND, orthopnea, LE edema. She denies palpitations or syncope. She does report significant NSAID use.   Given her ongoing symptoms, she presented to [facility] ED for further evaluation.   Present Illness There, EKG revealed NSR and old anterior Q waves. Initial TnI WNL. Labwork revealed hyperglycemia (300s). She admitted to noncompliance w/ her diabetic medications. Albumin 2.8. WBC 16.5K. U/a revealed an incidental UTI (leukocyte esterase 3+). She was placed on empiric antibiotics. CXR- cardiomegaly, no acute cardiopulmonary process.   She was admitted, started on IVFs. She had a PO trial w/ food today and experienced diarrhea. Labwork indicates K 3.0, Mg 1.6. She was given KCl 60 mEq PO x 1. Abdominal u/s indicated a stone in the gallbladder neck w/ associated gallbladder distention. No evidence of  Murphy's sign.   She was evaluated by GI today who recommended HIDA scan tomorrow. Surgery has been consulted.   This afternoon, she converted to an irregular tachycardia. EKG confirmed a-fib w/ RVR- HR 144 bpm. She feels well currently. She is asymptomatic denying palpitations, lightheadedness, SOB or cp. PAST MEDICAL HISTORY: 1. Hypertension.  2. Diabetes mellitus. The patient is supposed to take metformin.  3. Previous history of CVA.  4. Carotid endarterectomy.   ALLERGIES: No known drug allergies.   HOME MEDICATIONS: The patient is currently taking only aspirin and fish oil.  SOCIAL HISTORY: Smoked remotely, quit many years back. Denies drinking alcohol or using illicit drugs. Lives with her daughter.   FAMILY HISTORY: Diabetes mellitus. Mother with heart problems.   Physical Exam:  GEN no acute distress, obese   HEENT pink conjunctivae, PERRL, hearing intact to voice   NECK supple  No masses  trachea midline  L CEA scar appreciated, no bruits or JVD   RESP normal resp effort  clear BS  no use of accessory muscles   CARD irregularly irregular, tachycardic, no appreciable murmurs, rubs or gallops   ABD denies tenderness  soft  normal BS   EXTR negative cyanosis/clubbing, negative edema   SKIN normal to palpation   NEURO follows commands, motor/sensory function intact   PSYCH alert, A+O to time, place, person   Review of Systems:  Gastrointestinal: Nausea  Diarrhea   Review of Systems: All other systems were reviewed and found to be negative   Home Medications: Medication Instructions Status  aspirin 81 mg oral tablet 1 tab(s) orally once a day Active  Fish Oil 1200 mg oral  capsule 2 caps (2456m) orally once a day Active   Lab Results:  Routine Chem:  23-Mar-15 04:41   Magnesium, Serum  1.6 (1.8-2.4 THERAPEUTIC RANGE: 4-7 mg/dL TOXIC: > 10 mg/dL  -----------------------)  Glucose, Serum  101  BUN 7  Creatinine (comp)  0.59  Sodium, Serum  134   Potassium, Serum  3.0  Chloride, Serum 101  CO2, Serum 27  Calcium (Total), Serum  7.8  Anion Gap  6  Osmolality (calc) 266  eGFR (African American) >60  eGFR (Non-African American) >60 (eGFR values <684mmin/1.73 m2 may be an indication of chronic kidney disease (CKD). Calculated eGFR is useful in patients with stable renal function. The eGFR calculation will not be reliable in acutely ill patients when serum creatinine is changing rapidly. It is not useful in  patients on dialysis. The eGFR calculation may not be applicable to patients at the low and high extremes of body sizes, pregnant women, and vegetarians.)  Routine Hem:  23-Mar-15 04:41   WBC (CBC) 9.7  RBC (CBC) 3.97  Hemoglobin (CBC)  11.9  Hematocrit (CBC)  34.9  Platelet Count (CBC) 197  MCV 88  MCH 29.9  MCHC 34.0  RDW 13.6  Neutrophil % 74.3  Lymphocyte % 12.8  Monocyte % 11.4  Eosinophil % 1.1  Basophil % 0.4  Neutrophil #  7.2  Lymphocyte # 1.2  Monocyte #  1.1  Eosinophil # 0.1  Basophil # 0.0 (Result(s) reported on 30 Jun 2013 at 05:50AM.)   EKG:  Interpretation atrial fibrillation w/ RVR, Q waves V1-V3, III, aVF, LAD, no ST/T changes   Rate 144   EKG Comparision Changed from  admission tracing   Radiology Results: XRay:    22-Mar-15 03:17, Chest Portable Single View  Chest Portable Single View   REASON FOR EXAM:    N/V  COMMENTS:       PROCEDURE: DXR - DXRPORTABLE CHEST SINGLE VIEW  - Jun 29 2013  3:17AM     CLINICAL DATA:  Two day history of nausea and vomiting. Generalize  weakness. Hypoxemia P requiring oxygen therapy.    EXAM:  PORTABLECHEST - 1 VIEW    COMPARISON:  Portable examination 12/13/2010.    FINDINGS:  Cardiac silhouette mildly to moderately enlarged. Lungs clear.  Bronchovascular markings normal. Pulmonary vascularity normal. No  visible pleural effusions. No pneumothorax.     IMPRESSION:  Cardiomegaly. No acute cardiopulmonary disease.      Electronically  Signed    By: ThEvangeline Dakin.D.    On: 06/29/2013 04:20         Verified By: THDeniece PortelaM.D.,  [place]   23-Mar-15 09:54, [place] General Survey  [place] General Survey   REASON FOR EXAM:    nausea  COMMENTS:       PROCEDURE: [place]- [place]OM[place] GENERAL SURVEY  - Jun 30 2013  9:54AM     CLINICAL DATA:  Nausea    EXAM:  ULTRASOUND ABDOMEN COMPLETE    COMPARISON:  None.    FINDINGS:  Gallbladder:  The gallbladder is mildlydistended and contains echogenic bowel as  well as a stone which appears impacted in the gallbladder neck. It  measures much as 2.7 cm in greatest dimension. There is no positive  sonographic Murphy's sign. The gallbladder wall measures 7.2 mm in  diameter. A trace of pericholecystic fluid may be present. A small  diverticulum within the gallbladder is suspected.    Common bile duct:  Diameter: 3.8 mm    Liver:    No focal lesion identified. Within normal limits in parenchymal  echogenicity. The liver is mildly enlarged with maximal dimension of  20 cm.    IVC:    No abnormality visualized.    Pancreas:    Obscured by bowel gas.    Spleen:    Size and appearance within normal limits.    Right Kidney:  Length: 13.0. Echogenicity within normal limits. No mass or  hydronephrosis visualized.    Left Kidney:    Length: 13.6. Echogenicity within normal limits. No mass or  hydronephrosis visualized.    Abdominal aorta:    No aneurysm visualized. Bowel gas did limit the evaluation of the  bifurcation.    Other findings:   IMPRESSION:  1. The gallbladder exhibits a stone impacted in the neck. There is  mild gallbladder wall thickening but there is no positive  sonographic Murphy's sign. Further evaluation with CT scanning may  be useful.  2.The liver is mildly enlarged. The spleen is normal in size. The  pancreas was obscured by bowel gas.  3. No acute abnormality of the kidneys is demonstrated.      Electronically  Signed    By: David  Martinique    On: 06/30/2013 10:09       Verified By: DAVID A. [place], M.D., MD    No Known Allergies:   Vital Signs/Nurse's Notes: **Vital Signs.:   23-Mar-15 17:07  Vital Signs Type Post Medication  Pulse Pulse 125  Pulse source if not from Vital Sign Device per Telemetry Clerk  Systolic BP Systolic BP 810  Diastolic BP (mmHg) Diastolic BP (mmHg) 75  Mean BP 88  Pulse Ox % Pulse Ox % 92  Pulse Ox Activity Level  At rest  Oxygen Delivery 2L; Nasal Cannula  Pulse Ox After Adjustment (RN or RCP Only) % 92  Oxygen Delivery Adjusted To (RN or RCP Only)  2L; Nasal Cannula  Telemetry pattern Cardiac Rhythm Atrial fibrillation; pattern reported by Telemetry Clerk; pts HR bouncing from 117-150, cardiologist PA present and aware    Impression 1. New onset atrial fibrillation w/ RVR- suspect this is in the setting of the patient's underlying nausea, vomiting, diarrhea and subsequent electrolyte derangements. K is 3.0, Mg 1.4 today. 2D echo in 2013 did indicate mild LA dilatation. HR 150s, now 120-130s s/p Cardizem 32m IV x 1. She is tolerating the rhythm well denying palpitations, SOB, lightheadedness or chest pain. Unclear at this time if the patient will require surgery yet for cholelithiasis in bladder neck w/ GB distention. CHA2DS2VASc = 7 (CHF, HTN, DM2, age > 656 h/o CVA, carotid artery dz, female) indicating high risk of thromboembolism. -- Start heparin until formal surgical plans outlined. Pt will likely need financial assistance with oral anticoagulation. Could consider Xarelto to foster compliance.  -- Start Cardizem gtt-- will transfer to CCU to allow for this -- Obtain 2D echo -- Replete Mg, K  2. Ischemic cardiomyopathy, EF 40% Per 2013 echo. Will update. No signs or symptoms of decompensated CHF at this time.  -- Rate-control for a-fib to minimize risk of A/C combined CHF -- Continue ACEi -- Would switch Lopressor to either Toprol-XL w/ BID dosing for  rate-control or Coreg given better glucose handling for mortality benefit in HFrEF -- Monitor daily weights, I/Os  3. Presumed CAD Clopidogrel suspended until surgery plans outlined. No evidence of anginal symptoms or ischemia this admission.  -- Plavix, ACEi, BB, statin, NTG SL  PRN once clinically improved  4. GI Nausea, vomiting, diarrhea. Gall bladder distended due to GB neck stone. HIDA to be performed tomorrow. No abdominal pain.  -- Management per GI/primary/surgical teams  5. Uncontrolled DM2 -- Glycemic control per primary team   Electronic Signatures for Addendum Section:  Kathlyn Sacramento (MD) (Signed Addendum 23-Mar-15 17:45)  The patient was seen and examined. Agree with the above. She is known to me with history of presumed ischemic cardiomyopathy, stroke and previous CEA. She presented with nausea, vomiting with associated deyhdration and electrolytes abnormalites. She went into A_fib with RVR this afternoon. Rate is still uncontrolled in spite of getting 1 dose of IV Diltiazem. Both K and Mag are low. She denies chest pain or dyspnea.  She is tachycardiac by exam. Recommend: Diltiazem drip for rate control.  Start Heparin. Long term anticoagulation is recommended.  Replace electrolytes.  Echo.   Electronic Signatures: Meriel Pica (PA-C)  (Signed 23-Mar-15 17:41)  Authored: General Aspect/Present Illness, History and Physical Exam, Review of System, Home Medications, Labs, EKG , Radiology, Allergies, Vital Signs/Nurse's Notes, Impression/Plan Kathlyn Sacramento (MD)  (Signed 23-Mar-15 17:45)  Co-Signer: General Aspect/Present Illness, History and Physical Exam, Review of System, Home Medications, Labs, EKG , Radiology, Allergies, Vital Signs/Nurse's Notes, Impression/Plan   Last Updated: 23-Mar-15 17:45 by Kathlyn Sacramento (MD)

## 2014-08-01 NOTE — Consult Note (Signed)
PATIENT NAME:  Meghan [name], Meghan [name] MR#:  [id number] DATE OF BIRTH:  [birth year]  DATE OF CONSULTATION:  06/30/2013  REFERRING PHYSICIAN:  Dr. Max Sane. CONSULTING PHYSICIAN:  Loistine Simas, MD, and Janalyn Harder. Jerelene Redden, ANP (Adult Nurse Practitioner)  REASON FOR CONSULTATION: Impacted gallstone, nausea, and vomiting.   HISTORY OF PRESENT ILLNESS: This patient is a 64 year old female with a history of diabetes mellitus, hypertension, peripheral vascular disease, previous history of CVA experiencing nausea and vomiting for 1 week. She denied abdominal pain but was having nausea and vomiting to the point where she could not smell food. The patient denies heartburn or indigestion.   She reports a lot of ibuprofen use for hand pain in the last month, 800 mg three times daily. She has utilized ibuprofen sporadically prior to this.   The patient presented to the Emergency Room and was found to have a urinary tract infection, hyperglycemia, and was unable to take her diabetic medication secondary to financial reasons. She was found to have a WBC of 16,000. Ultrasound performed reveals gallbladder mildly distended and contains echogenic bile as well as stone, which appears impacted in the gallbladder neck. Negative Murphy sign. Trace pericholecystic fluid and a small diverticulum within the gallbladder noted.   The patient is on antibiotic therapy. She is on Plavix and GI evaluation has been recommended.   PAST MEDICAL HISTORY:  1.  Hypertension.  2.  Diabetes mellitus.  3.  Peripheral vascular disease.  4.  CVA.  PAST SURGICAL HISTORY: Carotid endarterectomy.   HOME MEDICATIONS: Aspirin, fish oil.   SOCIAL HISTORY: Previous tobacco. Negative alcohol or drug use. She lives with her daughter.   FAMILY HISTORY: Negative for colon cancer.   REVIEW OF SYSTEMS: The patient reports passage of black stools x2. She denies bright red blood. Occasional weakness. Stool was brown this morning. She denies any  diarrhea or abdominal pain. She has had urinary frequency. She says she has just felt sick all last week. Low-grade temperature is noted 99.9.   PHYSICAL EXAMINATION:  VITAL SIGNS: 38.1, 147, 20, 108/69, pulse ox 95% on 2 liters, 93% on room air.  GENERAL: Elderly Caucasian female resting in bed.  HEENT: Head is normocephalic. Conjunctivae pink. Sclerae anicteric. Oral mucosa is moist and intact.  NECK: Supple. Trachea is midline.  HEART: Heart tones tachycardia, irregular rhythm.  LUNGS: CTA. Respirations are eupneic.  ABDOMEN: Soft, nontender all quadrants without HSM or masses. Normal bowel sounds.  RECTAL: Examination shows brown heme-negative stool.  EXTREMITIES: Lower extremities without edema, cyanosis, or clubbing.  SKIN: Warm and dry without rash.  NEUROLOGIC: Cranial nerves II through XII grossly intact.  PSYCHIATRIC:  Affect and mood within normal. Pleasant.   LABORATORY, DIAGNOSTIC, AND RADIOLOGICAL DATA: Abdominal ultrasound as noted in history with a mildly distended gallbladder containing echogenic bile as well as stone which appears impacted in the gallbladder neck. This measures 2.7 cm in greatest dimension. Negative Murphy sign. Gallbladder wall measures 7.2 mm in diameter. Trace pericholecystic fluid may be present. A small diverticulum within the gallbladder is suspected. Liveris mildly enlarged with normal parenchyma.Pancreas is obscured. Spleen is within normal.   CXR single view with cardiomegaly and normal lung fields.   LABORATORY DATA: K 3.0, Mg 1.6, albumin 2.8, bili total 1.0, AST 23 WBC 16.5 on admission and now 9.7, hemoglobin 11.9, platelet count 197, BUN 7. Glucose 101.   IMPRESSION:   The patient presents with feeling sick all last week with main symptom as nausea and vomiting to the  point where she did not want to eat as just the smell of food made her sick. She was able to eat a regular tray today.  Abdominal US is abnormal with gallstone impacted in the neck  of the gallbladder, gallbadder is mildly distended. Liver labs with low albumin otherwise unremarkable.  She says postprandial after eating she can pass a large bowel movement. She has seen a few episodes of black stool. The patient is heme-negative currently. She is feeling better now. Tolerated lunch well. No abdominal pain or tenderness. She does have a history of heavy nonsteroidal anti inflammatory use. The patient has low-grade temperature and acute episode of tachycardia with finding of atrial fibrillation.   PLAN:  1.  Cardiology and surgical consults pending.  2.  Recommend continued antibiotic therapy.  3.  Obtain STAT HIDA, but unfortunately she ate a few hours ago, so we will need to do this in the morning. The patient appears to be on Plavix. Would recommend Protonix IV q.12 hours. The HIDA study is ordered to evaluate if the stone is fully obstructive, in which case surgery would be needed. If there is backflow around the stone and it is not fully obstructive, which may be present because of her normal liver enzymes, would need to consider upper endoscopy when clinically feasible.  4.  The patient is being started on metoprolol for her atrial fibrillation with rapid ventricular rate. She will need to be monitored clinically before she is eligible for upper endoscopy.   This case was discussed with Dr. Gustavo Lah in collaboration of care. These services provided by [facility]   These services provided by Janalyn Harder. Jerelene Redden, MS, APRN, BC, ANP (Adult Nurse Practitioner) under collaborative agreement with Loistine Simas, M.D.  ____________________________ Janalyn Harder. Jerelene Redden, ANP (Adult Nurse Practitioner) kam:np D: 06/30/2013 21:07:00 ET T: 06/30/2013 21:44:53 ET JOB#: [id number]  cc: Joelene Millin A. Jerelene Redden, ANP (Adult Nurse Practitioner), <Dictator> Janalyn Harder Sherlyn Hay, MSN, ANP-BC Adult Nurse Practitioner ELECTRONICALLY SIGNED 07/01/2013 12:17

## 2014-08-01 NOTE — Discharge Summary (Signed)
PATIENT NAME:  Meghan [name], Meghan [name] MR#:  [id number] DATE OF BIRTH:  1950-10-30  DATE OF ADMISSION:  06/29/2013 DATE OF DISCHARGE:  07/11/2013  ADDENDUM   At this point, she also has been seen by physical therapy and initial recommendation was to rehab.  However, the patient steadfastly refused to go to rehab.  We encourage her to ambulate here and the patient has been ambulating without significant difficulty; however, the hypoxemia has remained.  Although initially she refused oxygen as well, the risks, benefits, of not going with oxygen was discussed with her including progressive hypoxemia, worsening shortness of breath, going back into A. Fib and worsening respiratory failure and MI and death and she ultimately did decide to go onto oxygen.  She will follow with cardiology as an outpatient for further titration per cardiac medications and follow-up for her INR.   PHYSICAL EXAMINATION: VITAL SIGNS:  On the day of discharge, temperature was 97.9, pulse rate 61 and in sinus rhythm, respiratory rate 18, blood pressure 117/69, O2 sat 98% on 2 liters and 86% with exertion on room air.  This did correct with oxygen.   GENERAL:  She is an obese female lying in bed, no obvious distress.  HEENT:  Normocephalic, atraumatic.  LUNGS:  Left-sided mild basilar crackles.  Good air entry without significant wheezing or rhonchi.   HEART:  Normal S1, S2, regular.  No significant murmurs.  The patient has bandages applied to the right upper quadrant without significant tenderness.  No rebound or guarding.  EXTREMITIES:  Does not show any significant edema.   Total time spent is about 40 minutes.    ____________________________ Vivien Presto, MD sa:ea D: 07/12/2013 16:31:43 ET T: 07/13/2013 00:08:17 ET JOB#: [id number]  cc: Vivien Presto, MD, <Dictator> Muhammad A. Fletcher Anon, MD Vivien Presto MD ELECTRONICALLY SIGNED 08/07/2013 14:17

## 2014-08-01 NOTE — Consult Note (Signed)
Chief Complaint:  Subjective/Chief Complaint Patient seen for n/v, finding of gallstone impacted in GB neck.  Currently feeling better.  There has vbeen no abdominalpain.  No sick contacts.  Hospitalization complicated by AF/RVR, now seen by cardiology and in ICU.   Planning for HIDA tomorrow.  No fever, though mild low grade temp. No evidence of biliary obstruction.   DDx would include enteritis/gastroenteritis.  Following.  Another GI att may see patient tomorrow as I am in jury duty.   Electronic Signatures: Loistine Simas (MD)  (Signed 23-Mar-15 20:14)  Authored: Chief Complaint   Last Updated: 23-Mar-15 20:14 by Loistine Simas (MD)

## 2014-08-01 NOTE — Discharge Summary (Signed)
PATIENT NAME:  Meghan [name], Meghan [name] MR#:  [id number] DATE OF BIRTH:  July 17, 1950  DATE OF ADMISSION:  06/29/2013 DATE OF DISCHARGE:  07/11/2013  CONSULTANTS:   1.  Dr. Gustavo Lah and Dr. Rayann Heman from GI.  2.  Dr. Rexene Edison and Dr. Marina Gravel from surgery.  3.  Dr. Rockey Situ, Dr. Fletcher Anon and Dr. Virl Axe from cardiology.  4.  Dr. Humphrey Rolls from pulmonary,   CHIEF COMPLAINT:  Nausea, vomiting.   DISCHARGE DIAGNOSES: 1.  Acute hypoxic respiratory failure due to acute on chronic systolic congestive heart failure.  2.  Rapid atrial fibrillation.  3.  Urinary tract infection.  4.  Diabetes with bout of hypoglycemia.  5.  Dehydration.  6.  Acute purulent cholecystitis status post laparoscopic cholecystectomy.  7.  Ischemic cardiomyopathy with ejection fraction of about 35% to 40%.   DISCHARGE MEDICATIONS:  Aspirin 81 mg daily, fish oil at 1200 mg two caps once a day, amiodarone 200 mg 2 times a day, warfarin 1 mg once a day, atorvastatin 10 mg once a day, furosemide 40 mg once a day, metoprolol tartrate 25 mg every 12 hours, Glucotrol XL 5 mg once a day, tramadol 50 mg 1/2 tab every six hours as needed for pain.    She will be getting discharged with home oxygen 2 liters via nasal cannula.   DIET:  Low-sodium, low-fat, low-cholesterol, ADA diet.   ACTIVITY:  As tolerated.   FOLLOWUP:  Please follow with PCP, cardiologist and surgery within 1 to 2 weeks.  Check INR with your PCP early next week.   DISPOSITION:  Home.   SIGNIFICANT LABORATORY AND IMAGING STUDIES:  Initial x-ray, cardiomegaly, no acute cardiopulmonary disease.  Abdominal ultrasound, general survey, showing gallbladder exhibits a stone impacted in the neck.  Mild gallbladder wall thickening.  No positive sonographic Murphy sign.  Liver is mildly enlarged.  No acute abnormalities in the kidneys.  HIDA scan showing no definitive filling of the gallbladder lumen which is concerning for cystic duct obstruction, related to acute cholecystitis.  Hepatobiliary  image nuclear medicine on March 30th showing no evidence of bile leak, patent common bile duct.  X-ray chest, PA and lateral, on April 2 increasing left lower lobe infiltrate and effusion.  Initial BUN 21, creatinine 0.86, sodium 126, potassium 3.8.  Initial white count of 16.5, hemoglobin 14, platelets 210.  Last hemoglobin of 10.2.  White count of 12.9, last INR 2.6.  Urine cultures on arrival:  Mixed bacterial organism, likely contaminant.  Urinalysis on arrival with 2+ blood, 3+ leukocyte esterase, 23 RBCs, 46, WBC, 1+ bacteria.   HISTORY OF PRESENT ILLNESS AND HOSPITAL COURSE:  For full details of H and P, please see the dictation on March 22 by Dr. Lunette Stands, but briefly, this is a 64 year old with diabetes, hypertension, peripheral vascular disease, status post left carotid endarterectomy, history of stroke who came in with nausea and vomiting for the last week, per H and P the smell of food caused her to have vomiting and she was unable to keep food down.  She did have elevated white blood cell count of about 16,000, was admitted to the hospitalist service for possible gastroparesis versus acute abdominal process like cholelithiasis or cholecystitis.  She was made nothing by mouth, started on IV fluids, nausea medicines and the patient was noted also have a dirty urine, was started on ceftriaxone and urine cultures were sent.  In regards to the nausea and vomiting, this is likely secondary to acute purulent cholecystitis as she did have  a positive HIDA scan and she was taken to the OR by Dr. Rexene Edison on the 27th of March.  The patient was noted to have acute purulent cholecystitis with a probable rupture.  She was continued on antibiotics.  She did have a drain initially which was draining significant amounts.  She was taken to a HIDA scan again as after several days she was noted to have possible bilious drain at the JP drain; however, the HIDA scan was negative for that.  She had a patent common bile  duct and she was also seen by GI.  The JP drain was removed after the drainage amount had decreased.  She did have acute respiratory failure and issues with volume status while she was hospitalized and that was likely secondary to acute on chronic systolic CHF as she does have an EF of about 35% to 40%.  The patient was seen by cardiology and was diuresed off and on.  She was started on Lasix IV as needed and then was discharged on by mouth Lasix.  She also did have new-onset A. Fib with RVR and given high CHADS score was also anticoagulated with heparin.  She was started on amiodarone as well and did convert to sinus rhythm.  At this point, she is in sinus.  She does require diltiazem drip for the A. Fib with RVR and will be discharged on metoprolol, Coumadin and amiodarone and she will be following with cardiology as an outpatient.  In regards to the acute respiratory failure, unfortunately we were unable to take her off of oxygen completely and she will be discharged with oxygen and further weaning down could be done as an outpatient with diuresis, fluid control and restriction.  She had no significant fevers or productive cough to suggest she was having a pneumonia.  She was discharged without any antibiotics.  She was also noted to be wheezy by pulmonary and she was a smoker before, but quit years ago.  It is possible she has underlying COPD and she should also have outpatient pulmonary function tests once better.  At this point her nausea, vomiting has resolved.  She has no significant abdominal pain and her diet has been increased and she will be discharged with outpatient follow-up.    ____________________________ Vivien Presto, MD sa:ea D: 07/12/2013 16:28:33 ET T: 07/13/2013 00:39:43 ET JOB#: [id number]  cc: Vivien Presto, MD, <Dictator> Vivien Presto MD ELECTRONICALLY SIGNED 08/07/2013 14:17

## 2014-08-19 ENCOUNTER — Ambulatory Visit (INDEPENDENT_AMBULATORY_CARE_PROVIDER_SITE_OTHER): Payer: Medicare Other

## 2014-08-19 DIAGNOSIS — Z5181 Encounter for therapeutic drug level monitoring: Secondary | ICD-10-CM | POA: Diagnosis not present

## 2014-08-19 DIAGNOSIS — I4891 Unspecified atrial fibrillation: Secondary | ICD-10-CM

## 2014-08-19 LAB — POCT INR: INR: 3.5

## 2014-09-23 ENCOUNTER — Telehealth: Payer: Self-pay

## 2014-09-23 ENCOUNTER — Ambulatory Visit (INDEPENDENT_AMBULATORY_CARE_PROVIDER_SITE_OTHER): Payer: Medicare Other

## 2014-09-23 DIAGNOSIS — I4891 Unspecified atrial fibrillation: Secondary | ICD-10-CM

## 2014-09-23 DIAGNOSIS — Z5181 Encounter for therapeutic drug level monitoring: Secondary | ICD-10-CM | POA: Diagnosis not present

## 2014-09-23 LAB — POCT INR: INR: 2.2

## 2014-09-23 NOTE — Telephone Encounter (Signed)
Office received fax from [facility] stating need for re-certification of medical necessity for continuing home oxygen. S/w patient who indicates she has not used or needed oxygen since May 2015. States she continues to notify [facility] to pick up tanks but no one has done it. Indicates she has a form from [facility] to complete stating she does not want oxygen anymore. Pt says she will complete and return it to them.

## 2014-10-28 ENCOUNTER — Ambulatory Visit (INDEPENDENT_AMBULATORY_CARE_PROVIDER_SITE_OTHER): Payer: Medicare Other

## 2014-10-28 DIAGNOSIS — I4891 Unspecified atrial fibrillation: Secondary | ICD-10-CM

## 2014-10-28 DIAGNOSIS — Z5181 Encounter for therapeutic drug level monitoring: Secondary | ICD-10-CM | POA: Diagnosis not present

## 2014-10-28 LAB — POCT INR: INR: 2.2

## 2014-10-29 DIAGNOSIS — E1142 Type 2 diabetes mellitus with diabetic polyneuropathy: Secondary | ICD-10-CM | POA: Diagnosis not present

## 2014-10-29 DIAGNOSIS — E1165 Type 2 diabetes mellitus with hyperglycemia: Secondary | ICD-10-CM | POA: Diagnosis not present

## 2014-10-29 DIAGNOSIS — E780 Pure hypercholesterolemia: Secondary | ICD-10-CM | POA: Diagnosis not present

## 2014-11-08 ENCOUNTER — Other Ambulatory Visit: Payer: Self-pay | Admitting: Cardiovascular Disease

## 2014-11-12 ENCOUNTER — Other Ambulatory Visit: Payer: Self-pay | Admitting: *Deleted

## 2014-11-12 MED ORDER — WARFARIN SODIUM 3 MG PO TABS: ORAL_TABLET | ORAL | Status: DC

## 2014-11-16 ENCOUNTER — Ambulatory Visit: Payer: Medicare Other | Admitting: Cardiovascular Disease

## 2014-11-16 DIAGNOSIS — I10 Essential (primary) hypertension: Secondary | ICD-10-CM | POA: Diagnosis not present

## 2014-11-16 DIAGNOSIS — E780 Pure hypercholesterolemia: Secondary | ICD-10-CM | POA: Diagnosis not present

## 2014-11-16 DIAGNOSIS — I482 Chronic atrial fibrillation: Secondary | ICD-10-CM | POA: Diagnosis not present

## 2014-11-16 DIAGNOSIS — E1142 Type 2 diabetes mellitus with diabetic polyneuropathy: Secondary | ICD-10-CM | POA: Diagnosis not present

## 2014-11-25 ENCOUNTER — Ambulatory Visit (INDEPENDENT_AMBULATORY_CARE_PROVIDER_SITE_OTHER): Payer: Medicare Other

## 2014-11-25 DIAGNOSIS — I4891 Unspecified atrial fibrillation: Secondary | ICD-10-CM | POA: Diagnosis not present

## 2014-11-25 DIAGNOSIS — Z5181 Encounter for therapeutic drug level monitoring: Secondary | ICD-10-CM

## 2014-11-25 LAB — POCT INR: INR: 2.2

## 2014-12-07 ENCOUNTER — Ambulatory Visit: Payer: Medicare Other | Admitting: Cardiovascular Disease

## 2014-12-24 DIAGNOSIS — L97509 Non-pressure chronic ulcer of other part of unspecified foot with unspecified severity: Secondary | ICD-10-CM | POA: Diagnosis not present

## 2014-12-24 DIAGNOSIS — Z09 Encounter for follow-up examination after completed treatment for conditions other than malignant neoplasm: Secondary | ICD-10-CM | POA: Diagnosis not present

## 2014-12-24 DIAGNOSIS — R29898 Other symptoms and signs involving the musculoskeletal system: Secondary | ICD-10-CM | POA: Diagnosis not present

## 2014-12-24 DIAGNOSIS — E1165 Type 2 diabetes mellitus with hyperglycemia: Secondary | ICD-10-CM | POA: Diagnosis not present

## 2014-12-24 DIAGNOSIS — Z23 Encounter for immunization: Secondary | ICD-10-CM | POA: Diagnosis not present

## 2014-12-24 DIAGNOSIS — E6609 Other obesity due to excess calories: Secondary | ICD-10-CM | POA: Diagnosis not present

## 2014-12-24 DIAGNOSIS — E11621 Type 2 diabetes mellitus with foot ulcer: Secondary | ICD-10-CM | POA: Diagnosis not present

## 2015-01-06 ENCOUNTER — Ambulatory Visit (INDEPENDENT_AMBULATORY_CARE_PROVIDER_SITE_OTHER): Payer: Medicare Other

## 2015-01-06 DIAGNOSIS — I4891 Unspecified atrial fibrillation: Secondary | ICD-10-CM

## 2015-01-06 DIAGNOSIS — Z5181 Encounter for therapeutic drug level monitoring: Secondary | ICD-10-CM

## 2015-01-06 LAB — POCT INR: INR: 1.6

## 2015-01-13 ENCOUNTER — Ambulatory Visit: Payer: Medicare Other | Attending: Internal Medicine | Admitting: Physical Therapy

## 2015-01-13 ENCOUNTER — Encounter: Payer: Self-pay | Admitting: Physical Therapy

## 2015-01-13 DIAGNOSIS — R5381 Other malaise: Secondary | ICD-10-CM

## 2015-01-13 DIAGNOSIS — R269 Unspecified abnormalities of gait and mobility: Secondary | ICD-10-CM | POA: Diagnosis not present

## 2015-01-13 DIAGNOSIS — R531 Weakness: Secondary | ICD-10-CM | POA: Diagnosis not present

## 2015-01-13 NOTE — Therapy (Addendum)
[facility] [address] [place], [place], [address] Phone: [phone]   Fax:  [phone]  Physical Therapy Evaluation  Patient Details  Name: Meghan [name] MRN: [id number] Date of Birth: [birth year] Referring Provider:  Sheran Fava, *  Encounter Date: 01/13/2015      PT End of Session - 01/13/15 1532    Visit Number 1   Number of Visits 8   Date for PT Re-Evaluation 02/10/15   Authorization - Visit Number 1   Authorization - Number of Visits 10   PT Start Time [id number]   PT Stop Time 1345   PT Time Calculation (min) 52 min   Equipment Utilized During Treatment Gait belt   Activity Tolerance Patient tolerated treatment well;Patient limited by fatigue   Behavior During Therapy [facility] for tasks assessed/performed      Past Medical History  Diagnosis Date  . Hypertension   . Diabetes mellitus without complication ([place])     Type II  . Hyperlipidemia   . Stroke [facility]) 2013  . Coronary artery disease     Past Surgical History  Procedure Laterality Date  . Leg surgery      right;infection  . Carotid endarterectomy      [facility]; Dr. Lucky Cowboy  . Cholecystectomy      There were no vitals filed for this visit.  Visit Diagnosis:  Gait difficulty  Generalized weakness  Physical deconditioning      Subjective Assessment - 01/13/15 1515    Subjective Pt reports no pain but gradual weakness in her LEs over the past couple of years. Pt reports decrease in endurance over the past couple of year. Pt reports having a stroke a couple of years ago and thinks it was in 2014. Pt lives with her daughter and takes care of her grandchildren (72,12 and 27 years old). Pt reports fatigue with vacuuming and housework.    Limitations Standing;Lifting;House hold activities;Walking   How long can you stand comfortably? 5 mins   How long can you walk comfortably? < 5 mins   Patient Stated Goals regain her strength/be safe with walking without having  to use a cane   Currently in Pain? No/denies             OBJECTIVE:  LEFS: 38/80 BERG: 45/56 MMT: grossly B LE 4/5 except for B hip flexors 3+/5 ROM: WFL There ex: standing marching/standing hip abduction/standing hip flexion 5 x 2 each. Semi tandem stance 30 seconds x 2 (issued as HEP). Fatigued noted.         PT Education - 01/13/15 1531    Education provided Yes   Education Details See handout above. Pt educated on [facility] gait sequence and importance of using SPC outside due to balance concerns.    Person(s) Educated Patient   Methods Explanation;Demonstration;Verbal cues;Handout   Comprehension Verbalized understanding;Returned demonstration             PT Long Term Goals - 01/13/15 1552    PT LONG TERM GOAL #1   Title Pt will improve BERG score to > 49/56 in order to safely ambulate community distances without use of SPC.   Baseline 45/56 on 10/5   Time 4   Period Weeks   Status New   PT LONG TERM GOAL #2   Title Pt will improve LEFS to >50/80 in order to improve functional mobility.   Baseline 38/80 on 10/5   Time 4   Period Weeks   Status New  PT LONG TERM GOAL #3   Title Pt will be independent with HEP in order to increase B hip flexion by 1/2 MMT to decrease flat foot with walking.   Baseline 3+/5, flat foot and foot drag noted, increased with fatigue   Time 4   Period Weeks   Status New   PT LONG TERM GOAL #4   Title Pt will complete a 6 MWT for endurance baseline.   Time 4   Period Weeks   Status New   PT LONG TERM GOAL #5   Title Pt will ambulate on level and unlevel surfaces with no AD for 10 minutes with no shortness of breath or increased fatigue in order to grocery shop safely.   Time 4   Period Weeks   Status New            Plan - 01/13/15 1534    Clinical Impression Statement Pt is a 64 y.o. F with balance and gait difficulties. Pt ambulates without an assistive device and is hesitant to use one. Pt ambulates with decreased  arm swing, decreased hip and knee flexion bilaterally and flat foot. Pt has moments of foot drag on R LE and is unable to correct with verbal cues. With SPC, pt is able to increase her stride length and cadence. For balance responses, pt demonstrates postural sway at the trunk and balance righting responses from her hip strategies. Pt is unable to feel in her feet which leads to her dependency on hip strategy. Pt MMT B LE is grossly 4/5 except for hip flexors which are 3+/5 MMT.  Pt ROM is WFL. Pt sensation is equal bilateral with sharp/dull and light touch except for posterior L calf decreased to R posterior calf. BERG: 45/56 with trouble with tandem and standing on one leg. Increased postural sway noted with eyes closed balance. Balance on airex led to incrased hip responses and postural sway posteriorly without UE suport. LEFS: 38/80. Pt will benefit from short term skilled PT to address balance, gait and strength in order to increase safety and independence with functional mobility.   Pt will benefit from skilled therapeutic intervention in order to improve on the following deficits Abnormal gait;Decreased activity tolerance;Difficulty walking;Obesity;Improper body mechanics;Postural dysfunction;Impaired sensation;Increased edema;Decreased strength;Decreased mobility;Decreased balance;Impaired flexibility   Rehab Potential Good   PT Frequency 2x / week   PT Duration 4 weeks   PT Treatment/Interventions ADLs/Self Care Home Management;Cryotherapy;Moist Heat;Balance training;Therapeutic exercise;Manual techniques;Therapeutic activities;Functional mobility training;Stair training;Gait training;Patient/family education;Neuromuscular re-education;Energy conservation   PT Next Visit Plan progress standing balance/airex work/gait with SPC over grass/discuss gym and or silver sneakers   PT Home Exercise Plan see handout   Consulted and Agree with Plan of Care Patient          G-Codes - 01/19/15 0815     Functional Assessment Tool Used Berg/ clinical impression/ gait   Functional Limitation Mobility: Walking and moving around   Mobility: Walking and Moving Around Current Status [id number]) At least 40 percent but less than 60 percent impaired, limited or restricted   Mobility: Walking and Moving Around Goal Status [phone]) At least 20 percent but less than 40 percent impaired, limited or restricted       Problem List Patient Active Problem List   Diagnosis Date Noted  . Encounter for therapeutic drug monitoring 07/16/2013  . Atrial fibrillation ([place]) 07/16/2013  . Paroxysmal atrial fibrillation ([place]) 07/16/2013  . Ischemic cardiomyopathy 07/16/2013  . Essential hypertension 07/16/2013  . Chronic systolic heart failure ([place])  04/12/2012  . Coronary artery disease   . Dyspnea 03/01/2012  . Preop cardiovascular exam 03/01/2012   Pura Spice, PT, DPT # [phone]  01/14/2015, 8:16 AM  [place] [facility] [address] [place], [place], [address] Phone: [phone]   Fax:  [phone]

## 2015-01-14 ENCOUNTER — Encounter: Payer: Self-pay | Admitting: Cardiovascular Disease

## 2015-01-14 ENCOUNTER — Ambulatory Visit (INDEPENDENT_AMBULATORY_CARE_PROVIDER_SITE_OTHER): Payer: Medicare Other | Admitting: Cardiovascular Disease

## 2015-01-14 VITALS — BP 122/78 | HR 83 | Ht 68.0 in | Wt 235.5 lb

## 2015-01-14 DIAGNOSIS — I2510 Atherosclerotic heart disease of native coronary artery without angina pectoris: Secondary | ICD-10-CM

## 2015-01-14 DIAGNOSIS — I5022 Chronic systolic (congestive) heart failure: Secondary | ICD-10-CM

## 2015-01-14 DIAGNOSIS — I10 Essential (primary) hypertension: Secondary | ICD-10-CM

## 2015-01-14 DIAGNOSIS — I4891 Unspecified atrial fibrillation: Secondary | ICD-10-CM

## 2015-01-14 DIAGNOSIS — I480 Paroxysmal atrial fibrillation: Secondary | ICD-10-CM

## 2015-01-14 NOTE — Assessment & Plan Note (Signed)
She appears to be euvolemic on current dose of furosemide. She is in [place] class II. Continue treatment with metoprolol and lisinopril.

## 2015-01-14 NOTE — Assessment & Plan Note (Signed)
Blood pressure is controlled on current medications. 

## 2015-01-14 NOTE — Patient Instructions (Signed)
Medication Instructions: Stop taking Aspirin.  Continue other medications.   Labwork: None.   Procedures/Testing: None.   Follow-Up: 9 months with Dr. Fletcher Anon.   Any Additional Special Instructions Will Be Listed Below (If Applicable).

## 2015-01-14 NOTE — Assessment & Plan Note (Signed)
There is no evidence of recurrent arrhythmia after stopping amiodarone. She continues to be on anticoagulation with warfarin. Given that her atrial fibrillation was in the setting of cholecystitis, I might consider stopping anticoagulation if no evidence of recurrent arrhythmia. In the meanwhile, I asked her to discontinue aspirin to minimize the risk of bleeding.

## 2015-01-14 NOTE — Progress Notes (Signed)
HPI  This is a 64 year old female who is here today for a followup visit regarding cardiomyopathy, presumed underlying coronary artery disease and atrial fibrillation. Other medical problems include hypertension, 2 diabetes, hyperlipidemia and carotid artery disease status post left carotid endarterectomy with previous CVA.  She was evaluated in 2013 initially in the setting of acute CVA. A 2D echo was performed revealing EF 45-50% and anterior HK, severe apical HK, impaired LV relaxation, mild LA dilatation noted at that time. She subsequently underwent Lexiscan Myoview confirming prior anterior infarct, no evidence of ischemia. EF 41%. She was treated medically with ASA, BB, statin. She followed up 04/2012. No anginal or CHF type symptoms. Declined outpatient cath.  She was hospitalized in March of 2015 at [facility] for acute cholecystitis. In this setting, she developed a-fib w/ RVR requiring transfer to CCU for diltiazem and amiodarone infusion and titration for rate-control. She converted to sinus rhythm. She underwent cholecystitis without complications. She was started on anticoagulation with warfarin.  Echocardiogram while hospitalized showed an ejection fraction of 35-40%. She has been doing well and denies chest pain, dyspnea or palpitations. Amiodarone was discontinued with no evidence of recurrent atrial fibrillation.    No Known Allergies   Current Outpatient Prescriptions on File Prior to Visit  Medication Sig Dispense Refill  . atorvastatin (LIPITOR) 10 MG tablet Take 1 tablet (10 mg total) by mouth daily. 90 tablet 3  . furosemide (LASIX) 20 MG tablet Take 1 tablet (20 mg total) by mouth daily. 90 tablet 3  . glipiZIDE (GLUCOTROL) 5 MG tablet Takes 2 tablets am and 1 tablet pm daily.    Marland Kitchen lisinopril (PRINIVIL,ZESTRIL) 10 MG tablet TAKE ONE TABLET BY MOUTH ONCE DAILY 90 tablet 3  . Omega-3 Fatty Acids (FISH OIL) 1200 MG CAPS Take by mouth daily.    Marland Kitchen warfarin (COUMADIN) 3 MG  tablet Take as directed by coumadin clinic 80 tablet 1  . metoprolol tartrate (LOPRESSOR) 25 MG tablet TAKE ONE TABLET BY MOUTH TWICE DAILY (Patient not taking: Reported on 01/14/2015) 60 tablet 5   No current facility-administered medications on file prior to visit.     Past Medical History  Diagnosis Date  . Hypertension   . Diabetes mellitus without complication ([place])     Type II  . Hyperlipidemia   . Stroke [facility]) 2013  . Coronary artery disease      Past Surgical History  Procedure Laterality Date  . Leg surgery      right;infection  . Carotid endarterectomy      [facility]; Dr. Lucky Cowboy  . Cholecystectomy       Family History  Problem Relation Age of Onset  . Heart disease Mother      Social History   Social History  . Marital Status: Widowed    Spouse Name: N/A  . Number of Children: N/A  . Years of Education: N/A   Occupational History  . Not on file.   Social History Main Topics  . Smoking status: Former Smoker -- 0.50 packs/day for 5 years    Types: Cigarettes  . Smokeless tobacco: Not on file  . Alcohol Use: No  . Drug Use: No  . Sexual Activity: Not on file   Other Topics Concern  . Not on file   Social History Narrative     PHYSICAL EXAM   BP 122/78 mmHg  Pulse 83  Ht 5\' 8"  (1.727 m)  Wt 235 lb 8 oz (106.822 kg)  BMI 35.82 kg/m2  Constitutional: She is oriented to person, place, and time. She appears well-developed and well-nourished. No distress.  HENT: No nasal discharge.  Head: Normocephalic and atraumatic.  Eyes: Pupils are equal and round. Right eye exhibits no discharge. Left eye exhibits no discharge.  Neck: Normal range of motion. Neck supple. No JVD present. No thyromegaly present. Left carotid bruit. Cardiovascular: Normal rate, regular rhythm, normal heart sounds. Exam reveals no gallop and no friction rub. No murmur heard.  Pulmonary/Chest: Effort normal and breath sounds normal. No stridor. No respiratory distress. She has no  wheezes. She has no rales. She exhibits no tenderness.  Abdominal: Soft. Bowel sounds are normal. She exhibits no distension. There is no tenderness. There is no rebound and no guarding.  Musculoskeletal: Normal range of motion. She exhibits no edema and no tenderness.  Neurological: She is alert and oriented to person, place, and time. Coordination normal.  Skin: Skin is warm and dry. No rash noted. She is not diaphoretic. No erythema. No pallor.  Psychiatric: She has a normal mood and affect. Her behavior is normal. Judgment and thought content normal.   [id number]  Rhythm  Old anterior MI  ABNORMAL      ASSESSMENT AND PLAN

## 2015-01-15 ENCOUNTER — Other Ambulatory Visit: Payer: Self-pay | Admitting: Cardiovascular Disease

## 2015-01-15 NOTE — Telephone Encounter (Signed)
Please review for refill on Warfarin. Thanks!  

## 2015-01-18 ENCOUNTER — Encounter: Payer: Medicare Other | Admitting: Physical Therapy

## 2015-01-18 ENCOUNTER — Ambulatory Visit: Payer: Medicare Other | Admitting: Physical Therapy

## 2015-01-20 ENCOUNTER — Encounter: Payer: Self-pay | Admitting: Physical Therapy

## 2015-01-20 ENCOUNTER — Ambulatory Visit: Payer: Medicare Other | Admitting: Physical Therapy

## 2015-01-20 ENCOUNTER — Encounter: Payer: Medicare Other | Admitting: Physical Therapy

## 2015-01-20 DIAGNOSIS — R531 Weakness: Secondary | ICD-10-CM

## 2015-01-20 DIAGNOSIS — R5381 Other malaise: Secondary | ICD-10-CM | POA: Diagnosis not present

## 2015-01-20 DIAGNOSIS — R269 Unspecified abnormalities of gait and mobility: Secondary | ICD-10-CM

## 2015-01-20 NOTE — Therapy (Signed)
[facility] [address]. [place], [place], [address] Phone: [phone]   Fax:  [phone]  Physical Therapy Treatment  Patient Details  Name: Meghan [name] MRN: [id number] Date of Birth: Apr 13, 1950 Referring Provider:  Sheran Fava, *  Encounter Date: 01/20/2015      PT End of Session - 01/20/15 1349    Visit Number 2   Number of Visits 8   Date for PT Re-Evaluation 02/10/15   Authorization - Visit Number 2   Authorization - Number of Visits 10   PT Start Time [id number]   PT Stop Time 1202   PT Time Calculation (min) 41 min   Equipment Utilized During Treatment Gait belt   Activity Tolerance Patient tolerated treatment well;Patient limited by fatigue   Behavior During Therapy [facility] for tasks assessed/performed      Past Medical History  Diagnosis Date  . Hypertension   . Diabetes mellitus without complication ([place])     Type II  . Hyperlipidemia   . Stroke [facility]) 2013  . Coronary artery disease     Past Surgical History  Procedure Laterality Date  . Leg surgery      right;infection  . Carotid endarterectomy      [facility]; Dr. Lucky Cowboy  . Cholecystectomy      There were no vitals filed for this visit.  Visit Diagnosis:  Gait difficulty  Generalized weakness  Physical deconditioning      Subjective Assessment - 01/20/15 1348    Subjective Pt reports no pain but weakness and fatigue in her legs. Pt states R knee feels unsteady.  Pt. has not been using SPC as recommended.  Pt. missed Monday appt. due to forgetting about it/ losing schedule.     Limitations Standing;Lifting;House hold activities;Walking   How long can you stand comfortably? 5 mins   How long can you walk comfortably? < 5 mins   Patient Stated Goals regain her strength/be safe with walking without having to use a cane   Currently in Pain? No/denies       OBJECTIVE: There ex: standing hip flexion/abduction/extension 10 x 2 each. Tandem stance,  difficulty without light touch UE support (<5 second hold without UE support). Alternating foot cone taps in // bars with UE support x 5 to increase hip and knee flexion. Seated hip abduction/marching/LAQ x 20. Gait Training: Ambulation around the clinic with and without SPC with verbal cues for increased hip and knee flexion, heel strike and decreased R hip hike. Pt ambulated with increased hip and knee flexion and increased cadence with SPC (pt still seems hesitant to use one at home). Ambulation on unlevel terrain with SPC and SBA. Pt able to navigate curb, pine straw and open car door.  Pt response to Tx for medical necessity: Decreased endurance as evident by seated rest breaks. No increased c/o of R knee pain with gait training with SPC. Discussed importance of using SPC outdoors and around the home instead of walls and furniture.         PT Long Term Goals - 01/13/15 1552    PT LONG TERM GOAL #1   Title Pt will improve BERG score to > 49/56 in order to safely ambulate community distances without use of SPC.   Baseline 45/56 on 10/5   Time 4   Period Weeks   Status New   PT LONG TERM GOAL #2   Title Pt will improve LEFS to >50/80 in order to improve functional mobility.  Baseline 38/80 on 10/5   Time 4   Period Weeks   Status New   PT LONG TERM GOAL #3   Title Pt will be independent with HEP in order to increase B hip flexion by 1/2 MMT to decrease flat foot with walking.   Baseline 3+/5, flat foot and foot drag noted, increased with fatigue   Time 4   Period Weeks   Status New   PT LONG TERM GOAL #4   Title Pt will complete a 6 MWT for endurance baseline.   Time 4   Period Weeks   Status New   PT LONG TERM GOAL #5   Title Pt will ambulate on level and unlevel surfaces with no AD for 10 minutes with no shortness of breath or increased fatigue in order to grocery shop safely.   Time 4   Period Weeks   Status New            Plan - 01/20/15 1349    Clinical Impression  Statement Pt demonstrates decreased hip and knee flexion bilaterally with ambulation. Pt has R hip hike when ambulating without an assitive device. With SPC, pt is able to ambulate in a fluid pattern with no R hip hike and increased knee and hip flexion bilaterally. Pt has difficulty maintaining balance in // bars without UE support.    Pt will benefit from skilled therapeutic intervention in order to improve on the following deficits Abnormal gait;Decreased activity tolerance;Difficulty walking;Obesity;Improper body mechanics;Postural dysfunction;Impaired sensation;Increased edema;Decreased strength;Decreased mobility;Decreased balance;Impaired flexibility   Rehab Potential Good   PT Frequency 2x / week   PT Duration 4 weeks   PT Treatment/Interventions ADLs/Self Care Home Management;Cryotherapy;Moist Heat;Balance training;Therapeutic exercise;Manual techniques;Therapeutic activities;Functional mobility training;Stair training;Gait training;Patient/family education;Neuromuscular re-education;Energy conservation   PT Next Visit Plan progress standing balance/airex work/gait with SPC over grass/discuss gym and or silver sneakers   PT Home Exercise Plan see handout   Consulted and Agree with Plan of Care Patient        Problem List Patient Active Problem List   Diagnosis Date Noted  . Encounter for therapeutic drug monitoring 07/16/2013  . Atrial fibrillation ([place]) 07/16/2013  . Paroxysmal atrial fibrillation ([place]) 07/16/2013  . Ischemic cardiomyopathy 07/16/2013  . Essential hypertension 07/16/2013  . Chronic systolic heart failure ([place]) 04/12/2012  . Coronary artery disease   . Dyspnea 03/01/2012  . Preop cardiovascular exam 03/01/2012   Pura Spice, PT, DPT # [id number]   01/21/2015, 8:11 AM  [place] [facility] [address] [place], [place], [address] Phone: [phone]   Fax:  [phone]

## 2015-01-25 ENCOUNTER — Ambulatory Visit: Payer: Medicare Other | Admitting: Physical Therapy

## 2015-01-25 DIAGNOSIS — R269 Unspecified abnormalities of gait and mobility: Secondary | ICD-10-CM | POA: Diagnosis not present

## 2015-01-25 DIAGNOSIS — R5381 Other malaise: Secondary | ICD-10-CM

## 2015-01-25 DIAGNOSIS — R531 Weakness: Secondary | ICD-10-CM | POA: Diagnosis not present

## 2015-01-26 NOTE — Therapy (Signed)
[facility] [address] [place], [place], [address] Phone: [phone]   Fax:  [phone]  Physical Therapy Treatment  Patient Details  Name: Meghan [name] MRN: [id number] Date of Birth: 1951-01-01 No Data Recorded  Encounter Date: 01/25/2015      PT End of Session - 01/26/15 0820    Visit Number 3   Number of Visits 8   Date for PT Re-Evaluation 02/10/15   Authorization - Visit Number 3   Authorization - Number of Visits 10   PT Start Time 1129   PT Stop Time 1201   PT Time Calculation (min) 32 min   Equipment Utilized During Treatment Gait belt   Activity Tolerance Patient tolerated treatment well;Patient limited by fatigue   Behavior During Therapy [facility] for tasks assessed/performed      Past Medical History  Diagnosis Date  . Hypertension   . Diabetes mellitus without complication ([place])     Type II  . Hyperlipidemia   . Stroke [facility]) 2013  . Coronary artery disease     Past Surgical History  Procedure Laterality Date  . Leg surgery      right;infection  . Carotid endarterectomy      [facility]; Dr. Lucky Cowboy  . Cholecystectomy      There were no vitals filed for this visit.  Visit Diagnosis:  Gait difficulty  Generalized weakness  Physical deconditioning      Subjective Assessment - 01/26/15 0812    Subjective Pt. states she has been busy all day with the grandkids and is tired today.  Pt. hasn't been able to buy a SPC at this time but is planning on it.     Limitations Standing;Lifting;House hold activities;Walking   How long can you stand comfortably? 5 mins   How long can you walk comfortably? < 5 mins   Patient Stated Goals regain her strength/be safe with walking without having to use a cane   Currently in Pain? No/denies        OBJECTIVE: There ex: standing hip flexion/abduction/extension while standing on Airex 10 x 2 each (light touch of //-bars for balance). Tandem stance, difficulty without light  touch UE support (5-10 second hold without UE support). Step ups/downs to increase hip and knee flexion. Seated hip abduction/marching/LAQ x 20. Gait Training: Ambulation around the clinic with and without SPC with verbal cues for increased hip and knee flexion, heel strike and decreased R hip hike. Pt ambulated with increased hip and knee flexion and increased cadence with SPC. Ambulation outside on unlevel terrain with SPC and SBA for cuing/ proper gait pattern. Pt able to navigate curb, grass and open car door/ step up into truck.  Pt response to Tx for medical necessity: Decreased endurance as evident by seated rest breaks.  Marked improvement in gait pattern/ cadence with use of SPC for safety/ more normalized gait pattern.  Discussed importance of using SPC outdoors and around the home instead of walls and furniture.  Pt. Agrees and will go to [employer] to look for [facility].          PT Long Term Goals - 01/13/15 1552    PT LONG TERM GOAL #1   Title Pt will improve BERG score to > 49/56 in order to safely ambulate community distances without use of SPC.   Baseline 45/56 on 10/5   Time 4   Period Weeks   Status New   PT LONG TERM GOAL #2   Title Pt will  improve LEFS to >50/80 in order to improve functional mobility.   Baseline 38/80 on 10/5   Time 4   Period Weeks   Status New   PT LONG TERM GOAL #3   Title Pt will be independent with HEP in order to increase B hip flexion by 1/2 MMT to decrease flat foot with walking.   Baseline 3+/5, flat foot and foot drag noted, increased with fatigue   Time 4   Period Weeks   Status New   PT LONG TERM GOAL #4   Title Pt will complete a 6 MWT for endurance baseline.   Time 4   Period Weeks   Status New   PT LONG TERM GOAL #5   Title Pt will ambulate on level and unlevel surfaces with no AD for 10 minutes with no shortness of breath or increased fatigue in order to grocery shop safely.   Time 4   Period Weeks   Status New              Plan - 01/26/15 0827    Clinical Impression Statement Increase lateral sway/ R hip hike with swing through phase of gait pattern without use of SPC.  PT really emphasized the importance of using a SPC, especially with walking outside/ uneven terrain.  Pt. easily fatigued with prolonged standing/ ther.ex. program in //-bars and requires CGA for Airex balance tasks.     Pt will benefit from skilled therapeutic intervention in order to improve on the following deficits Abnormal gait;Decreased activity tolerance;Difficulty walking;Obesity;Improper body mechanics;Postural dysfunction;Impaired sensation;Increased edema;Decreased strength;Decreased mobility;Decreased balance;Impaired flexibility   Rehab Potential Good   PT Frequency 2x / week   PT Duration 4 weeks   PT Treatment/Interventions ADLs/Self Care Home Management;Cryotherapy;Moist Heat;Balance training;Therapeutic exercise;Manual techniques;Therapeutic activities;Functional mobility training;Stair training;Gait training;Patient/family education;Neuromuscular re-education;Energy conservation   PT Next Visit Plan progress standing balance/airex work/gait with SPC over grass/discuss gym and or silver sneakers   PT Home Exercise Plan see handout   Consulted and Agree with Plan of Care Patient        Problem List Patient Active Problem List   Diagnosis Date Noted  . Encounter for therapeutic drug monitoring 07/16/2013  . Atrial fibrillation ([place]) 07/16/2013  . Paroxysmal atrial fibrillation ([place]) 07/16/2013  . Ischemic cardiomyopathy 07/16/2013  . Essential hypertension 07/16/2013  . Chronic systolic heart failure ([place]) 04/12/2012  . Coronary artery disease   . Dyspnea 03/01/2012  . Preop cardiovascular exam 03/01/2012   Pura Spice, PT, DPT # [phone]   01/26/2015, 8:43 AM  [facility] [facility] [address] [place], [place], [address] Phone: [phone]   Fax:  [phone]  Name: Meghan [name] MRN: [id number] Date of Birth: 1951-01-25

## 2015-01-27 ENCOUNTER — Ambulatory Visit: Payer: Medicare Other | Admitting: Physical Therapy

## 2015-01-27 DIAGNOSIS — R5381 Other malaise: Secondary | ICD-10-CM | POA: Diagnosis not present

## 2015-01-27 DIAGNOSIS — R269 Unspecified abnormalities of gait and mobility: Secondary | ICD-10-CM | POA: Diagnosis not present

## 2015-01-27 DIAGNOSIS — R531 Weakness: Secondary | ICD-10-CM | POA: Diagnosis not present

## 2015-01-28 NOTE — Therapy (Signed)
[place] [facility] [address] [place], [place], [address] Phone: [phone]   Fax:  [phone]  Physical Therapy Treatment  Patient Details  Name: Meghan [name] MRN: [id number] Date of Birth: 05-04-1950 No Data Recorded  Encounter Date: 01/27/2015      PT End of Session - 01/28/15 [id number]    Visit Number 4   Number of Visits 8   Date for PT Re-Evaluation 02/10/15   Authorization - Visit Number 4   Authorization - Number of Visits 10   PT Start Time 1115   PT Stop Time 1202   PT Time Calculation (min) 47 min   Equipment Utilized During Treatment Gait belt   Activity Tolerance Patient tolerated treatment well;Patient limited by fatigue   Behavior During Therapy [facility] for tasks assessed/performed      Past Medical History  Diagnosis Date  . Hypertension   . Diabetes mellitus without complication ([place])     Type II  . Hyperlipidemia   . Stroke [facility]) 2013  . Coronary artery disease     Past Surgical History  Procedure Laterality Date  . Leg surgery      right;infection  . Carotid endarterectomy      [facility]; Dr. Lucky Cowboy  . Cholecystectomy      There were no vitals filed for this visit.  Visit Diagnosis:  Gait difficulty  Generalized weakness  Physical deconditioning      Subjective Assessment - 01/28/15 0925    Subjective No new complaints.  Pt. states she went to [employer] this morning and they didn't have any SPC.  Pt. reports doing a lot walking this morning and B LE muscles are fatigued.     Limitations Standing;Lifting;House hold activities;Walking   How long can you stand comfortably? 5 mins   How long can you walk comfortably? < 5 mins   Patient Stated Goals regain her strength/be safe with walking without having to use a cane      OBJECTIVE: There ex: standing hip flexion/abduction/extension while standing on Airex 10 x 2 each (see HEP)- added wall squats with ball 10x2.   Step ups/downs to increase hip and knee  flexion 10x2 (mirror feedback).  Seated hip abduction/marching/LAQ x 20.  Gait Training: Ambulation around the clinic with and without SPC with verbal cues for increased hip and knee flexion, heel strike and decreased R hip hike. Pt ambulated with increased hip and knee flexion and increased cadence with SPC. Ambulation outside on unlevel terrain with SPC and SBA for cuing/ posture correction.  Neuro mm.: Berg reassessment 49 out of 56 (4 point improvement).  Cone touches with L/R and no UE assist with focus on ankle/LE stability.    Pt response to Tx for medical necessity:  Marked improvement in gait pattern/ cadence with use of SPC for safety/ more normalized gait pattern.  Improved balance noted by increase Berg balance test. Discussed importance of using SPC outdoors and around the home instead of walls and furniture.        PT Long Term Goals - 01/13/15 1552    PT LONG TERM GOAL #1   Title Pt will improve BERG score to > 49/56 in order to safely ambulate community distances without use of SPC.   Baseline 45/56 on 10/5   Time 4   Period Weeks   Status New   PT LONG TERM GOAL #2   Title Pt will improve LEFS to >50/80 in order to improve functional mobility.   Baseline  38/80 on 10/5   Time 4   Period Weeks   Status New   PT LONG TERM GOAL #3   Title Pt will be independent with HEP in order to increase B hip flexion by 1/2 MMT to decrease flat foot with walking.   Baseline 3+/5, flat foot and foot drag noted, increased with fatigue   Time 4   Period Weeks   Status New   PT LONG TERM GOAL #4   Title Pt will complete a 6 MWT for endurance baseline.   Time 4   Period Weeks   Status New   PT LONG TERM GOAL #5   Title Pt will ambulate on level and unlevel surfaces with no AD for 10 minutes with no shortness of breath or increased fatigue in order to grocery shop safely.   Time 4   Period Weeks   Status New            Plan - 01/28/15 [id number]    Clinical Impression Statement Pt.  continues to present with increase lateral sway/ R hip hike with swing through phase of gait pattern without use of SPC.  Extra time/ SBA required with step overs/ cone taps in //-bars without UE assist.  Several short rest breaks required with standing ther.ex./ neuro mm. reeducation.     Pt will benefit from skilled therapeutic intervention in order to improve on the following deficits Abnormal gait;Decreased activity tolerance;Difficulty walking;Obesity;Improper body mechanics;Postural dysfunction;Impaired sensation;Increased edema;Decreased strength;Decreased mobility;Decreased balance;Impaired flexibility   Rehab Potential Good   PT Frequency 2x / week   PT Duration 4 weeks   PT Treatment/Interventions ADLs/Self Care Home Management;Cryotherapy;Moist Heat;Balance training;Therapeutic exercise;Manual techniques;Therapeutic activities;Functional mobility training;Stair training;Gait training;Patient/family education;Neuromuscular re-education;Energy conservation   PT Next Visit Plan progress standing balance/airex work/gait with SPC over grass/discuss gym and or silver sneakers   PT Home Exercise Plan see handout   Consulted and Agree with Plan of Care Patient        Problem List Patient Active Problem List   Diagnosis Date Noted  . Encounter for therapeutic drug monitoring 07/16/2013  . Atrial fibrillation ([place]) 07/16/2013  . Paroxysmal atrial fibrillation ([place]) 07/16/2013  . Ischemic cardiomyopathy 07/16/2013  . Essential hypertension 07/16/2013  . Chronic systolic heart failure ([place]) 04/12/2012  . Coronary artery disease   . Dyspnea 03/01/2012  . Preop cardiovascular exam 03/01/2012   Pura Spice, PT, DPT # [id number]   01/28/2015, 9:32 AM  [place] [facility] [address] [place], [place], [address] Phone: [phone]   Fax:  [phone]  Name: Meghan [name] MRN: [id number] Date of Birth: [birth year]

## 2015-02-01 ENCOUNTER — Encounter: Payer: Self-pay | Admitting: Physical Therapy

## 2015-02-01 ENCOUNTER — Ambulatory Visit: Payer: Medicare Other | Admitting: Physical Therapy

## 2015-02-01 DIAGNOSIS — R269 Unspecified abnormalities of gait and mobility: Secondary | ICD-10-CM

## 2015-02-01 DIAGNOSIS — R5381 Other malaise: Secondary | ICD-10-CM | POA: Diagnosis not present

## 2015-02-01 DIAGNOSIS — R531 Weakness: Secondary | ICD-10-CM | POA: Diagnosis not present

## 2015-02-01 NOTE — Therapy (Signed)
[place] [facility] [address] [place], [place], [address] Phone: [phone]   Fax:  [phone]  Physical Therapy Treatment  Patient Details  Name: Meghan [name] MRN: [id number] Date of Birth: 10-Jul-1950 No Data Recorded  Encounter Date: 02/01/2015      PT End of Session - 02/01/15 1735    Visit Number 5   Number of Visits 8   Date for PT Re-Evaluation 02/10/15   Authorization - Visit Number 5   Authorization - Number of Visits 10   PT Start Time [id number]   PT Stop Time 1151   PT Time Calculation (min) 33 min   Equipment Utilized During Treatment Gait belt   Activity Tolerance Patient tolerated treatment well;Patient limited by fatigue   Behavior During Therapy [facility] for tasks assessed/performed      Past Medical History  Diagnosis Date  . Hypertension   . Diabetes mellitus without complication ([place])     Type II  . Hyperlipidemia   . Stroke [facility]) 2013  . Coronary artery disease     Past Surgical History  Procedure Laterality Date  . Leg surgery      right;infection  . Carotid endarterectomy      [facility]; Dr. Lucky Cowboy  . Cholecystectomy      There were no vitals filed for this visit.  Visit Diagnosis:  Gait difficulty  Generalized weakness  Physical deconditioning      Subjective Assessment - 02/01/15 1733    Subjective Pt reports minimal L knee pain. Pt reports being outside with her grandchildren over the weekend and no problems. Pt states that she reallys wants to and feels the need to get a cane.    Limitations Standing;Lifting;House hold activities;Walking   How long can you stand comfortably? 5 mins   How long can you walk comfortably? < 5 mins   Patient Stated Goals regain her strength/be safe with walking without having to use a cane   Currently in Pain? Yes   Pain Score 2    Pain Location Knee   Pain Orientation Left   Pain Descriptors / Indicators Aching;Dull   Pain Type Chronic pain   Pain Onset More than a  month ago   Pain Frequency Intermittent      OBJECTIVE:  5 time sit to stand test: 30 seconds. There ex: Standing hip 4 way with Green TB x 20 each leg. In the // bars: walking marching/braiding/side stepping x 6 each. Step ups and down on 3" step x 15 each leg. Gait: In the //- bars, ambulation with R UE support to mimic cane. Ambulation around gym and over unlevel surfaces with SPC (increased cadence, L knee flexion and fluidity noted).   Pt response to Tx for medical necessity: Pt benefits from conditioning to increase overall endurance. Pt's safety with gait benefits from use of SPC.         PT Education - 02/01/15 1735    Education provided Yes   Education Details Pt given green TB to progress home standing hip exercises.    Person(s) Educated Patient   Methods Explanation;Verbal cues;Demonstration   Comprehension Returned demonstration;Verbalized understanding             PT Long Term Goals - 01/13/15 1552    PT LONG TERM GOAL #1   Title Pt will improve BERG score to > 49/56 in order to safely ambulate community distances without use of SPC.   Baseline 45/56 on 10/5   Time 4  Period Weeks   Status New   PT LONG TERM GOAL #2   Title Pt will improve LEFS to >50/80 in order to improve functional mobility.   Baseline 38/80 on 10/5   Time 4   Period Weeks   Status New   PT LONG TERM GOAL #3   Title Pt will be independent with HEP in order to increase B hip flexion by 1/2 MMT to decrease flat foot with walking.   Baseline 3+/5, flat foot and foot drag noted, increased with fatigue   Time 4   Period Weeks   Status New   PT LONG TERM GOAL #4   Title Pt will complete a 6 MWT for endurance baseline.   Time 4   Period Weeks   Status New   PT LONG TERM GOAL #5   Title Pt will ambulate on level and unlevel surfaces with no AD for 10 minutes with no shortness of breath or increased fatigue in order to grocery shop safely.   Time 4   Period Weeks   Status New              Plan - 02/01/15 1735    Clinical Impression Statement Pt ambulates with decreased cadence and knee flexion without use of SPC. Pt ambulates with similar stride length with and without SPC but showed a marked improvement in gait fluidity and cadence with the SPC. Pt has increased c/o L knee pain with increased L knee flexion during gait. Pt limited by decreased endurance with standing exercises. Pt continues to benefit from gait training, strengthening, balance and functional training in order to maintain independence.  5 time sit to stand test: 30 seconds.    Pt will benefit from skilled therapeutic intervention in order to improve on the following deficits Abnormal gait;Decreased activity tolerance;Difficulty walking;Obesity;Improper body mechanics;Postural dysfunction;Impaired sensation;Increased edema;Decreased strength;Decreased mobility;Decreased balance;Impaired flexibility   Rehab Potential Good   PT Frequency 2x / week   PT Duration 4 weeks   PT Treatment/Interventions ADLs/Self Care Home Management;Cryotherapy;Moist Heat;Balance training;Therapeutic exercise;Manual techniques;Therapeutic activities;Functional mobility training;Stair training;Gait training;Patient/family education;Neuromuscular re-education;Energy conservation   PT Next Visit Plan balance on airex/functional reaching/increased strengthening/hip flexion/stairs/step ups and downs/discuss silver sneakers or community wellness program    PT Home Exercise Plan continue current one    Consulted and Agree with Plan of Care Patient        Problem List Patient Active Problem List   Diagnosis Date Noted  . Encounter for therapeutic drug monitoring 07/16/2013  . Atrial fibrillation ([place]) 07/16/2013  . Paroxysmal atrial fibrillation ([place]) 07/16/2013  . Ischemic cardiomyopathy 07/16/2013  . Essential hypertension 07/16/2013  . Chronic systolic heart failure ([place]) 04/12/2012  . Coronary artery disease   . Dyspnea  03/01/2012  . Preop cardiovascular exam 03/01/2012   Pura Spice, PT, DPT # [phone]   02/02/2015, 8:21 AM  [place] [facility] [address] [place], [place], [address] Phone: [phone]   Fax:  [phone]  Name: Breklynn Wollam MRN: [id number] Date of Birth: 01-17-1951

## 2015-02-03 ENCOUNTER — Ambulatory Visit: Payer: Medicare Other | Admitting: Physical Therapy

## 2015-02-03 DIAGNOSIS — R5381 Other malaise: Secondary | ICD-10-CM | POA: Diagnosis not present

## 2015-02-03 DIAGNOSIS — R269 Unspecified abnormalities of gait and mobility: Secondary | ICD-10-CM

## 2015-02-03 DIAGNOSIS — R531 Weakness: Secondary | ICD-10-CM | POA: Diagnosis not present

## 2015-02-03 NOTE — Therapy (Addendum)
[place] [address] [place], [place], [address] Phone: [phone]   Fax:  [phone]  Physical Therapy Treatment  Patient Details  Name: Meghan [name] MRN: [id number] Date of Birth: 12-Feb-1951 No Data Recorded  Encounter Date: 02/03/2015      PT End of Session - 02/08/15 0947    Visit Number 6   Number of Visits 8   Date for PT Re-Evaluation 02/10/15   Authorization - Visit Number 6   Authorization - Number of Visits 10   PT Start Time 1127   PT Stop Time 1211   PT Time Calculation (min) 44 min   Equipment Utilized During Treatment Gait belt   Activity Tolerance Patient tolerated treatment well;Patient limited by fatigue   Behavior During Therapy [facility] for tasks assessed/performed      Past Medical History  Diagnosis Date  . Hypertension   . Diabetes mellitus without complication ([place])     Type II  . Hyperlipidemia   . Stroke [facility]) 2013  . Coronary artery disease     Past Surgical History  Procedure Laterality Date  . Leg surgery      right;infection  . Carotid endarterectomy      [facility]; Dr. Lucky Cowboy  . Cholecystectomy      There were no vitals filed for this visit.  Visit Diagnosis:  Gait difficulty  Generalized weakness  Physical deconditioning      Subjective Assessment - 02/08/15 0942    Subjective Pt. states she still hasn't been able to find a cane but feels she is safer with walking/ SPC use, esp. on uneven terrain.  L knee pain reported prior to PT tx. session.  No falls or LOB reported with daily household tasks at this time.     Limitations Standing;Lifting;House hold activities;Walking   How long can you stand comfortably? 5 mins   How long can you walk comfortably? < 5 mins   Patient Stated Goals regain her strength/be safe with walking without having to use a cane   Currently in Pain? Yes   Pain Score 2    Pain Location Knee   Pain Orientation Left       OBJECTIVE:  Neuromm: EC/head  turning hallway ex/ high marching 40 feet x 2.  Resisted gait 2BTB 5x all 4-planes (moderate cuing for posture correction/ proper foot placement)/ alternating UE and LE in //-bar (mirror feedback) in hallway.  Posture correction in front of mirror during standing rest breaks. Gait training:  Gait with use of SPC outside on varying terrain (min. To moderate cuing and assist for proper technique/ use of SPC).  There.ex.:  Standing hip ex. (all planes in //-bars).     Pt response for medical necessity:  Pt. Progressing well with there.ex. But remains limited by poor overall endurance.  Pt. Continues to benefit from use of SPC with all walking surfaces, esp. Outside.           PT Long Term Goals - 01/13/15 1552    PT LONG TERM GOAL #1   Title Pt will improve BERG score to > 49/56 in order to safely ambulate community distances without use of SPC.   Baseline 45/56 on 10/5   Time 4   Period Weeks   Status New   PT LONG TERM GOAL #2   Title Pt will improve LEFS to >50/80 in order to improve functional mobility.   Baseline 38/80 on 10/5   Time 4   Period Weeks  Status New   PT LONG TERM GOAL #3   Title Pt will be independent with HEP in order to increase B hip flexion by 1/2 MMT to decrease flat foot with walking.   Baseline 3+/5, flat foot and foot drag noted, increased with fatigue   Time 4   Period Weeks   Status New   PT LONG TERM GOAL #4   Title Pt will complete a 6 MWT for endurance baseline.   Time 4   Period Weeks   Status New   PT LONG TERM GOAL #5   Title Pt will ambulate on level and unlevel surfaces with no AD for 10 minutes with no shortness of breath or increased fatigue in order to grocery shop safely.   Time 4   Period Weeks   Status New               Plan - 02/08/15 1016    Clinical Impression Statement L knee pain worsens with increase standing/balance tasks in //-bars.  Poor overall endurance with ther.ex.  Moderate cuing to increase L hip/knee flexion  and consistent step pattern in clinic and esp. outside on varying terrain.  Pt. continues to benefit from use of SPC with all aspects of gait, esp. outside.     Pt will benefit from skilled therapeutic intervention in order to improve on the following deficits Abnormal gait;Decreased activity tolerance;Difficulty walking;Obesity;Improper body mechanics;Postural dysfunction;Impaired sensation;Increased edema;Decreased strength;Decreased mobility;Decreased balance;Impaired flexibility   Rehab Potential Good   PT Frequency 2x / week   PT Duration 4 weeks   PT Treatment/Interventions ADLs/Self Care Home Management;Cryotherapy;Moist Heat;Balance training;Therapeutic exercise;Manual techniques;Therapeutic activities;Functional mobility training;Stair training;Gait training;Patient/family education;Neuromuscular re-education;Energy conservation   PT Next Visit Plan balance on airex/functional reaching/increased strengthening/hip flexion/stairs/step ups and downs/discuss silver sneakers or community wellness program    PT Home Exercise Plan continue current one    Consulted and Agree with Plan of Care Patient        Problem List Patient Active Problem List   Diagnosis Date Noted  . Encounter for therapeutic drug monitoring 07/16/2013  . Atrial fibrillation ([place]) 07/16/2013  . Paroxysmal atrial fibrillation ([place]) 07/16/2013  . Ischemic cardiomyopathy 07/16/2013  . Essential hypertension 07/16/2013  . Chronic systolic heart failure ([place]) 04/12/2012  . Coronary artery disease   . Dyspnea 03/01/2012  . Preop cardiovascular exam 03/01/2012   Pura Spice, PT, DPT # [phone]   02/08/2015, 10:25 AM  [facility] [address] [place], [place], [address] Phone: [phone]   Fax:  [phone]  Name: Merary Laurila MRN: [id number] Date of Birth: [birth year]

## 2015-02-08 ENCOUNTER — Encounter: Payer: Self-pay | Admitting: Physical Therapy

## 2015-02-08 ENCOUNTER — Ambulatory Visit: Payer: Medicare Other | Admitting: Physical Therapy

## 2015-02-08 DIAGNOSIS — R5381 Other malaise: Secondary | ICD-10-CM | POA: Diagnosis not present

## 2015-02-08 DIAGNOSIS — R269 Unspecified abnormalities of gait and mobility: Secondary | ICD-10-CM | POA: Diagnosis not present

## 2015-02-08 DIAGNOSIS — R531 Weakness: Secondary | ICD-10-CM

## 2015-02-08 NOTE — Therapy (Signed)
[place] [facility] [address] [place], [place], [address] Phone: [phone]   Fax:  [phone]  Physical Therapy Treatment  Patient Details  Name: Meghan [name] MRN: [id number] Date of Birth: [birth year] No Data Recorded  Encounter Date: 02/08/2015      PT End of Session - 02/08/15 1245    Visit Number 7   Number of Visits 8   Date for PT Re-Evaluation 02/10/15   Authorization - Visit Number 7   Authorization - Number of Visits 10   PT Start Time 1132   PT Stop Time 1202   PT Time Calculation (min) 30 min   Equipment Utilized During Treatment Gait belt   Activity Tolerance Patient tolerated treatment well;Patient limited by fatigue;Patient limited by pain   Behavior During Therapy [facility] for tasks assessed/performed      Past Medical History  Diagnosis Date  . Hypertension   . Diabetes mellitus without complication ([place])     Type II  . Hyperlipidemia   . Stroke [facility]) 2013  . Coronary artery disease     Past Surgical History  Procedure Laterality Date  . Leg surgery      right;infection  . Carotid endarterectomy      [facility]; Dr. Lucky Cowboy  . Cholecystectomy      There were no vitals filed for this visit.  Visit Diagnosis:  Gait difficulty  Generalized weakness  Physical deconditioning      Subjective Assessment - 02/08/15 1233    Subjective Pt reports L knee pain as a limiting factor. Pt reports no new complaints or any problems walking at trunk or treat over the weekend.   Limitations Standing;Lifting;House hold activities;Walking   How long can you stand comfortably? 5 mins   How long can you walk comfortably? < 5 mins   Patient Stated Goals regain her strength/be safe with walking without having to use a cane   Currently in Pain? No/denies       OBJECTIVE: There ex: Step downs off 3" step with B UE support x20 each. Pt able to step down with L but increased L knee pain when stepping down with R LE. Step ups onto 6"  step for 1 minute alternating LE stepping up with B UE support. Resisted walking all 4 planes with 2 BTB x 5 each. Heel and toe walking in // bars x 6 each. Pt had difficulty clearing toes off of the ground. Pt had difficulty maintaining toe clearance with heel walking. On blue Airex, heel raises/toe raises/marching/EO balance with one UE support x 20 each. Gait with no AD around the clinic to observe L knee; pt ambulates with decreased L stance time.   Pt response to Tx for medical necessity: Pt has decreased endurance and strength. Pt is limited with pain free ambulation by L knee. Pt benefits from strength and balance training to improve fluid gait pattern and safety with functional mobility.       PT Long Term Goals - 01/13/15 1552    PT LONG TERM GOAL #1   Title Pt will improve BERG score to > 49/56 in order to safely ambulate community distances without use of SPC.   Baseline 45/56 on 10/5   Time 4   Period Weeks   Status New   PT LONG TERM GOAL #2   Title Pt will improve LEFS to >50/80 in order to improve functional mobility.   Baseline 38/80 on 10/5   Time 4   Period Weeks  Status New   PT LONG TERM GOAL #3   Title Pt will be independent with HEP in order to increase B hip flexion by 1/2 MMT to decrease flat foot with walking.   Baseline 3+/5, flat foot and foot drag noted, increased with fatigue   Time 4   Period Weeks   Status New   PT LONG TERM GOAL #4   Title Pt will complete a 6 MWT for endurance baseline.   Time 4   Period Weeks   Status New   PT LONG TERM GOAL #5   Title Pt will ambulate on level and unlevel surfaces with no AD for 10 minutes with no shortness of breath or increased fatigue in order to grocery shop safely.   Time 4   Period Weeks   Status New             Plan - 02/08/15 1245    Clinical Impression Statement Pt has difficulty with L knee pain with step ups and stepping down with R LE. Pt ambulates without L knee flexion increasing R hip hike  and L LE circumduction. Pt has decreased L stance time during gait. Pt is unable to perform standing toe raises or ambulatng with toe walking due to weak gastroc and balance difficulties. Pt standing balance is increased as noted by her ability to stand for full treatment without a break.    Pt will benefit from skilled therapeutic intervention in order to improve on the following deficits Abnormal gait;Decreased activity tolerance;Difficulty walking;Obesity;Improper body mechanics;Postural dysfunction;Impaired sensation;Increased edema;Decreased strength;Decreased mobility;Decreased balance;Impaired flexibility   Rehab Potential Good   PT Frequency 2x / week   PT Duration 4 weeks   PT Treatment/Interventions ADLs/Self Care Home Management;Cryotherapy;Moist Heat;Balance training;Therapeutic exercise;Manual techniques;Therapeutic activities;Functional mobility training;Stair training;Gait training;Patient/family education;Neuromuscular re-education;Energy conservation   PT Next Visit Plan balance on airex/functional reaching/increased strengthening/hip flexion/stairs/step ups and downs/discuss silver sneakers or community wellness program .  Check goals/ schedule next tx.     PT Home Exercise Plan continue current one    Consulted and Agree with Plan of Care Patient        Problem List Patient Active Problem List   Diagnosis Date Noted  . Encounter for therapeutic drug monitoring 07/16/2013  . Atrial fibrillation ([place]) 07/16/2013  . Paroxysmal atrial fibrillation ([place]) 07/16/2013  . Ischemic cardiomyopathy 07/16/2013  . Essential hypertension 07/16/2013  . Chronic systolic heart failure ([place]) 04/12/2012  . Coronary artery disease   . Dyspnea 03/01/2012  . Preop cardiovascular exam 03/01/2012   Pura Spice, PT, DPT # [id number]   02/08/2015, 2:18 PM  [facility] [address] [place], [place], [address] Phone: [phone]   Fax:   [phone]  Name: Francess Cheese MRN: [id number] Date of Birth: 03-27-1951

## 2015-02-10 ENCOUNTER — Ambulatory Visit: Payer: Medicare Other | Attending: Internal Medicine | Admitting: Physical Therapy

## 2015-02-10 DIAGNOSIS — R531 Weakness: Secondary | ICD-10-CM | POA: Diagnosis not present

## 2015-02-10 DIAGNOSIS — R5381 Other malaise: Secondary | ICD-10-CM | POA: Insufficient documentation

## 2015-02-10 DIAGNOSIS — R269 Unspecified abnormalities of gait and mobility: Secondary | ICD-10-CM | POA: Insufficient documentation

## 2015-02-11 ENCOUNTER — Encounter: Payer: Self-pay | Admitting: Physical Therapy

## 2015-02-11 NOTE — Therapy (Signed)
[place] [facility] [address] [place], [place], [address] Phone: [phone]   Fax:  [phone]  Physical Therapy Treatment  Patient Details  Name: Eudora Guevarra MRN: [id number] Date of Birth: 11-23-1950 No Data Recorded  Encounter Date: 02/10/2015      PT End of Session - 02/11/15 9326    Visit Number 8   Number of Visits 14   Date for PT Re-Evaluation 03/03/15   Authorization - Visit Number 8   Authorization - Number of Visits 18   PT Start Time 1109   PT Stop Time 1154   PT Time Calculation (min) 45 min   Equipment Utilized During Treatment Gait belt   Activity Tolerance Patient tolerated treatment well;Patient limited by fatigue   Behavior During Therapy [facility] for tasks assessed/performed      Past Medical History  Diagnosis Date  . Hypertension   . Diabetes mellitus without complication ([place])     Type II  . Hyperlipidemia   . Stroke [facility]) 2013  . Coronary artery disease     Past Surgical History  Procedure Laterality Date  . Leg surgery      right;infection  . Carotid endarterectomy      [facility]; Dr. Lucky Cowboy  . Cholecystectomy      There were no vitals filed for this visit.  Visit Diagnosis:  Gait difficulty  Generalized weakness  Physical deconditioning      Subjective Assessment - 02/11/15 0927    Subjective Pt reports no L knee pain upon arrival to PT. Pt states trick or treating with grandchildren went well and has no new complaints.    Limitations Standing;Lifting;House hold activities;Walking   How long can you stand comfortably? 5 mins   How long can you walk comfortably? < 5 mins   Patient Stated Goals regain her strength/be safe with walking without having to use a cane   Currently in Pain? No/denies      OBJECTIVE: BERG: 48/56 MMT: R LE 4+/5, L LE 4/5 with difficulty with L hip flexion There ex: toe walking/heel walking/marching in // bars x 4 each. Neuro re-ed: Single leg balance on firm with  bilateral UE support. Pt able to maintain balance for < 5 seconds without UE support. Pt tended to bounce hands back and forth from bars. Pt attempted tandem stance but required bilateral UE support to maintain. Gait training: Forwards and backwards walking in the // bars. Pt able to demonstrate consistent heel strike with B UE support in the bars. Ambulation with SPC over grass and asphalt. Pt demonstrates increased cadence and safety with SPC in grass. Pt educated on importance of getting an SPC to use at home.  Pt response to Tx for medical necessity: Pt benefits from gait training to increase safety with functional mobility. Pt benefits from strengthening to decrease fall risk and increase endurance.         PT Long Term Goals - 02/10/15 1120    PT LONG TERM GOAL #1   Title Pt will improve BERG score to > 49/56 in order to safely ambulate community distances without use of SPC.   Baseline 48/56 on 11/2   Time 3   Period Weeks   Status Partially Met   PT LONG TERM GOAL #2   Title Pt will improve LEFS to >50/80 in order to improve functional mobility.   Baseline 47/80 on 11/2   Time 3   Period Weeks   Status Partially Met   PT  LONG TERM GOAL #3   Title Pt will be independent with HEP in order to increase B hip flexion by 1/2 MMT to decrease flat foot with walking.   Baseline toe drag still noted and inconsistent compliance with HEP.    Time 3   Period Weeks   Status On-going   PT LONG TERM GOAL #4   Title Pt will complete a 6 MWT and walk for 500 ft without use of SPC and no rest breaks in order to demonstrate an increase in endurance.   Baseline 2 standing rest breaks in hallway without use of SPC.  462 feet.     Time 4   Period Weeks   Status Revised   PT LONG TERM GOAL #5   Title Pt will demonstrate an increase in safety with outdoor ambulation with consistent decrease in toe drag and increase of use of SPC.   Baseline pt will purchase [facility] and demonstrate proper techinque with  SPC.   Time 3   Period Weeks   Status New   Additional Long Term Goals   Additional Long Term Goals Yes   PT LONG TERM GOAL #6   Title Pt will ambulate on level and unlevel surfaces with no AD for 10 minutes with no shortness of breath or increased fatigue in order to grocery shop safely.   Time 3   Period Weeks   Status Not Met               Plan - 02/11/15 6712    Clinical Impression Statement Pt is limited in endurance with ambulation over unlevel ground. Pt ambulates with SPC and decrease cadence on the grass as compared to over unlevel firm terrain or level terrain. Pt reports feeling better with [facility] and educated on importance to get one. Pt demonstrates an increase in balance as seem by her BERG score 48/56. Pt has difficulty with single leg stance and tandem stance. Pt needs UE support to feel safe with toe taps and advanced balance challenges. Pt's MMT R LE grossly 4+/5, L LE grossly 4/5; difficulty with L hip flexion due to pain.    Pt will benefit from skilled therapeutic intervention in order to improve on the following deficits Abnormal gait;Decreased activity tolerance;Difficulty walking;Obesity;Improper body mechanics;Postural dysfunction;Impaired sensation;Increased edema;Decreased strength;Decreased mobility;Decreased balance;Impaired flexibility   Rehab Potential Good   PT Frequency 2x / week   PT Duration 3 weeks   PT Treatment/Interventions ADLs/Self Care Home Management;Cryotherapy;Moist Heat;Balance training;Therapeutic exercise;Manual techniques;Therapeutic activities;Functional mobility training;Stair training;Gait training;Patient/family education;Neuromuscular re-education;Energy conservation   PT Next Visit Plan balance on airex/functional reaching/increased strengthening/hip flexion/stairs/step ups and downs/discuss silver sneakers or community wellness program .  Check goals/ schedule next tx.     PT Home Exercise Plan continue current one    Consulted and  Agree with Plan of Care Patient          G-Codes - Mar 09, 2015 1420    Functional Assessment Tool Used Berg/ clinical impression/ gait   Functional Limitation Mobility: Walking and moving around   Mobility: Walking and Moving Around Current Status [phone]) At least 20 percent but less than 40 percent impaired, limited or restricted   Mobility: Walking and Moving Around Goal Status [phone]) At least 1 percent but less than 20 percent impaired, limited or restricted      Problem List Patient Active Problem List   Diagnosis Date Noted  . Encounter for therapeutic drug monitoring 07/16/2013  . Atrial fibrillation ([place]) 07/16/2013  . Paroxysmal atrial fibrillation (  [place]) 07/16/2013  . Ischemic cardiomyopathy 07/16/2013  . Essential hypertension 07/16/2013  . Chronic systolic heart failure ([place]) 04/12/2012  . Coronary artery disease   . Dyspnea 03/01/2012  . Preop cardiovascular exam 03/01/2012   Pura Spice, PT, DPT # [phone]   02/11/2015, 2:22 PM  [place] [facility] [address] [place], [place], [address] Phone: [phone]   Fax:  [phone]  Name: Chellsea Beckers MRN: [id number] Date of Birth: 06/14/1950

## 2015-02-15 ENCOUNTER — Ambulatory Visit: Payer: Medicare Other | Admitting: Physical Therapy

## 2015-02-15 ENCOUNTER — Encounter: Payer: Self-pay | Admitting: Physical Therapy

## 2015-02-15 DIAGNOSIS — R531 Weakness: Secondary | ICD-10-CM | POA: Diagnosis not present

## 2015-02-15 DIAGNOSIS — R269 Unspecified abnormalities of gait and mobility: Secondary | ICD-10-CM

## 2015-02-15 DIAGNOSIS — R5381 Other malaise: Secondary | ICD-10-CM

## 2015-02-15 NOTE — Therapy (Signed)
[place] [facility] [address] [place], [place], [address] Phone: [phone]   Fax:  [phone]  Physical Therapy Treatment  Patient Details  Name: Meghan [name] MRN: [id number] Date of Birth: 02/27/1951 No Data Recorded  Encounter Date: 02/15/2015      PT End of Session - 02/15/15 1317    Visit Number 9   Number of Visits 14   Date for PT Re-Evaluation 03/03/15   Authorization - Visit Number 9   Authorization - Number of Visits 18   PT Start Time 8841   PT Stop Time 6606   PT Time Calculation (min) 52 min   Activity Tolerance Patient tolerated treatment well;Patient limited by pain;Patient limited by fatigue   Behavior During Therapy [facility] for tasks assessed/performed      Past Medical History  Diagnosis Date  . Hypertension   . Diabetes mellitus without complication ([place])     Type II  . Hyperlipidemia   . Stroke [facility]) 2013  . Coronary artery disease     Past Surgical History  Procedure Laterality Date  . Leg surgery      right;infection  . Carotid endarterectomy      [facility]; Dr. Lucky Cowboy  . Cholecystectomy      There were no vitals filed for this visit.  Visit Diagnosis:  Gait difficulty  Generalized weakness  Physical deconditioning      Subjective Assessment - 02/15/15 1317    Subjective Pt reports L knee bothering her some over the weekend with cleaning. Pt has no new complaints today.    Limitations Standing;Lifting;House hold activities;Walking   How long can you stand comfortably? 5 mins   How long can you walk comfortably? < 5 mins   Patient Stated Goals regain her strength/be safe with walking without having to use a cane   Currently in Pain? No/denies      OBJECTIVE: There ex: Nustep L7 10 mins (no charge). Forwards/backwards walking with normal base of support and tandem x 4 each direction. Side stepping with exaggeration for hip flexor engagement x 5. Resisted gait all 4 planes of motion with 2 BTB x 10  each direction. Sideways step overs on 3" step to increase use of hip flexors with ambulation with B UE support. Gait with head turns on command with use of SPC. Increased cadence and stride length with use of SPC. Gait to her car descending an incline with no failing. Gait with head turns and number finding without any deviations noted. Pt tends to veer towards side she is reading comment from. Neuro re-ed: On Airex: balance/balance with head extension/balance with toe raises/heel raises/marching x 30 each direction. ( mini squats mild aggravation to affected L knee.) Normal base of support stance with no UE support x 1 min.  Pt response to tx for medical necessity: Pt benefits from strengthening and increasing her balance. Pt reports improvement in strength since she arrived and has not fallen.          PT Long Term Goals - 02/10/15 1120    PT LONG TERM GOAL #1   Title Pt will improve BERG score to > 49/56 in order to safely ambulate community distances without use of SPC.   Baseline 48/56 on 11/2   Time 3   Period Weeks   Status Partially Met   PT LONG TERM GOAL #2   Title Pt will improve LEFS to >50/80 in order to improve functional mobility.   Baseline 47/80 on 11/2  Time 3   Period Weeks   Status Partially Met   PT LONG TERM GOAL #3   Title Pt will be independent with HEP in order to increase B hip flexion by 1/2 MMT to decrease flat foot with walking.   Baseline toe drag still noted and inconsistent compliance with HEP.    Time 3   Period Weeks   Status On-going   PT LONG TERM GOAL #4   Title Pt will complete a 6 MWT and walk for 500 ft without use of SPC and no rest breaks in order to demonstrate an increase in endurance.   Baseline 2 standing rest breaks in hallway without use of SPC.  462 feet.     Time 4   Period Weeks   Status Revised   PT LONG TERM GOAL #5   Title Pt will demonstrate an increase in safety with outdoor ambulation with consistent decrease in toe drag and  increase of use of SPC.   Baseline pt will purchase [facility] and demonstrate proper techinque with SPC.   Time 3   Period Weeks   Status New   Additional Long Term Goals   Additional Long Term Goals Yes   PT LONG TERM GOAL #6   Title Pt will ambulate on level and unlevel surfaces with no AD for 10 minutes with no shortness of breath or increased fatigue in order to grocery shop safely.   Time 3   Period Weeks   Status Not Met               Plan - 02/15/15 1321    Clinical Impression Statement Pt demonstrates proper compensatory responses with balance challenges on Airex. Pt is able to maintain balance without UE support while doing heel raises and marching. Pt reports no problem with turning to find numbers in the hallway but demonstrate deviation towards the side she is looking at. Pt performs head turns without difficulty or increased symptoms in the hallway with the use of the [facility]. Pt ambulates with long step length and an increased cadence with the use of SPC.    Pt will benefit from skilled therapeutic intervention in order to improve on the following deficits Abnormal gait;Decreased activity tolerance;Difficulty walking;Obesity;Improper body mechanics;Postural dysfunction;Impaired sensation;Increased edema;Decreased strength;Decreased mobility;Decreased balance;Impaired flexibility   Rehab Potential Good   PT Frequency 2x / week   PT Duration 3 weeks   PT Treatment/Interventions ADLs/Self Care Home Management;Cryotherapy;Moist Heat;Balance training;Therapeutic exercise;Manual techniques;Therapeutic activities;Functional mobility training;Stair training;Gait training;Patient/family education;Neuromuscular re-education;Energy conservation   PT Next Visit Plan functional reaching/balance on airex/gait training with SPC and without.   PT Home Exercise Plan continue current one    Consulted and Agree with Plan of Care Patient        Problem List Patient Active Problem List    Diagnosis Date Noted  . Encounter for therapeutic drug monitoring 07/16/2013  . Atrial fibrillation ([place]) 07/16/2013  . Paroxysmal atrial fibrillation ([place]) 07/16/2013  . Ischemic cardiomyopathy 07/16/2013  . Essential hypertension 07/16/2013  . Chronic systolic heart failure ([place]) 04/12/2012  . Coronary artery disease   . Dyspnea 03/01/2012  . Preop cardiovascular exam 03/01/2012    Lavone Neri, SPT 02/15/2015, 1:26 PM  [facility] [address] [place], [place], [address] Phone: [phone]   Fax:  [phone]  Name: Xian Apostol MRN: [id number] Date of Birth: Feb 19, 1951

## 2015-02-17 ENCOUNTER — Encounter: Payer: Medicare Other | Admitting: Physical Therapy

## 2015-02-17 ENCOUNTER — Ambulatory Visit (INDEPENDENT_AMBULATORY_CARE_PROVIDER_SITE_OTHER): Payer: Medicare Other | Admitting: *Deleted

## 2015-02-17 DIAGNOSIS — Z5181 Encounter for therapeutic drug level monitoring: Secondary | ICD-10-CM | POA: Diagnosis not present

## 2015-02-17 DIAGNOSIS — I4891 Unspecified atrial fibrillation: Secondary | ICD-10-CM

## 2015-02-17 LAB — POCT INR: INR: 2

## 2015-02-22 ENCOUNTER — Ambulatory Visit: Payer: Medicare Other | Admitting: Physical Therapy

## 2015-02-22 ENCOUNTER — Encounter: Payer: Self-pay | Admitting: Physical Therapy

## 2015-02-22 DIAGNOSIS — R531 Weakness: Secondary | ICD-10-CM

## 2015-02-22 DIAGNOSIS — R269 Unspecified abnormalities of gait and mobility: Secondary | ICD-10-CM

## 2015-02-22 DIAGNOSIS — R5381 Other malaise: Secondary | ICD-10-CM | POA: Diagnosis not present

## 2015-02-22 NOTE — Therapy (Signed)
[place] [facility] [address] [place], [place], [address] Phone: [phone]   Fax:  [phone]  Physical Therapy Treatment  Patient Details  Name: Meghan [name] MRN: [id number] Date of Birth: [birth year] No Data Recorded  Encounter Date: 02/22/2015      PT End of Session - 02/22/15 1345    Visit Number 10   Number of Visits 14   Date for PT Re-Evaluation 03/03/15   Authorization - Visit Number 10   Authorization - Number of Visits 18   PT Start Time 9242   PT Stop Time 1205   PT Time Calculation (min) 40 min   Activity Tolerance Patient limited by pain   Behavior During Therapy [facility] for tasks assessed/performed      Past Medical History  Diagnosis Date  . Hypertension   . Diabetes mellitus without complication ([place])     Type II  . Hyperlipidemia   . Stroke [facility]) 2013  . Coronary artery disease     Past Surgical History  Procedure Laterality Date  . Leg surgery      right;infection  . Carotid endarterectomy      [facility]; Dr. Lucky Cowboy  . Cholecystectomy      There were no vitals filed for this visit.  Visit Diagnosis:  Gait difficulty  Generalized weakness  Physical deconditioning      Subjective Assessment - 02/22/15 1345    Subjective Pt reports L knee pain and having difficulty walking. Pt reports no falls but states that she thought her L knee was going to buckle over the weekend.    Limitations Standing;Lifting;House hold activities;Walking   How long can you stand comfortably? 5 mins   How long can you walk comfortably? < 5 mins   Patient Stated Goals regain her strength/be safe with walking without having to use a cane   Currently in Pain? Yes   Pain Score 6    Pain Location Knee   Pain Orientation Left   Pain Type Chronic pain   Pain Onset More than a month ago   Pain Frequency Constant     OBJECTIVE: All there ex and manual in supine today due to increased L knee pain. Manual: STM to L distal hamstring  (increased tenderness and palpable swelling noted on medial side). Distal hamstring stretching. Patellar mobilizations grade II for pain tolerance all 4 planes 3 x 30 seconds. Increased medial joint line tenderness.  Pt response to tx for medical necessity: Pt benefits from balance and gait training. Strengthening in a pain free ROM today due to increased anataglic gait.      PT Long Term Goals - 02/10/15 1120    PT LONG TERM GOAL #1   Title Pt will improve BERG score to > 49/56 in order to safely ambulate community distances without use of SPC.   Baseline 48/56 on 11/2   Time 3   Period Weeks   Status Partially Met   PT LONG TERM GOAL #2   Title Pt will improve LEFS to >50/80 in order to improve functional mobility.   Baseline 47/80 on 11/2   Time 3   Period Weeks   Status Partially Met   PT LONG TERM GOAL #3   Title Pt will be independent with HEP in order to increase B hip flexion by 1/2 MMT to decrease flat foot with walking.   Baseline toe drag still noted and inconsistent compliance with HEP.    Time 3   Period Weeks  Status On-going   PT LONG TERM GOAL #4   Title Pt will complete a 6 MWT and walk for 500 ft without use of SPC and no rest breaks in order to demonstrate an increase in endurance.   Baseline 2 standing rest breaks in hallway without use of SPC.  462 feet.     Time 4   Period Weeks   Status Revised   PT LONG TERM GOAL #5   Title Pt will demonstrate an increase in safety with outdoor ambulation with consistent decrease in toe drag and increase of use of SPC.   Baseline pt will purchase [facility] and demonstrate proper techinque with SPC.   Time 3   Period Weeks   Status New   Additional Long Term Goals   Additional Long Term Goals Yes   PT LONG TERM GOAL #6   Title Pt will ambulate on level and unlevel surfaces with no AD for 10 minutes with no shortness of breath or increased fatigue in order to grocery shop safely.   Time 3   Period Weeks   Status Not Met                Plan - 02/22/15 1346    Clinical Impression Statement Pt is severely limited to activity tolerance secondary to L knee pain. Pt is unable to perform tasks in weight bearing on L LE today. Pt ambulates with an antalgic gait pattern and decreased L stance time. Pt has difficulty with L knee flexion during ambulation and compensates with R hip hike. Pt is able to perform short arc quad and bridging without incrased pain. Palpable tenderness to L medial hamstring with increased complaints of pain with L knee flexion. Circumfrential measurements of knee: joint line L/R: 44.5 cm/40 cm; distal quad L/R: 45 cm/41.5 cm; mid gastroc L/R: 42.5 cm/40 cm.    Pt will benefit from skilled therapeutic intervention in order to improve on the following deficits Abnormal gait;Decreased activity tolerance;Difficulty walking;Obesity;Improper body mechanics;Postural dysfunction;Impaired sensation;Increased edema;Decreased strength;Decreased mobility;Decreased balance;Impaired flexibility   Rehab Potential Good   PT Frequency 2x / week   PT Duration 3 weeks   PT Treatment/Interventions ADLs/Self Care Home Management;Cryotherapy;Moist Heat;Balance training;Therapeutic exercise;Manual techniques;Therapeutic activities;Functional mobility training;Stair training;Gait training;Patient/family education;Neuromuscular re-education;Energy conservation   PT Next Visit Plan functional reaching/balance on airex/gait training with SPC and without.   PT Home Exercise Plan continue current one    Consulted and Agree with Plan of Care Patient        Problem List Patient Active Problem List   Diagnosis Date Noted  . Encounter for therapeutic drug monitoring 07/16/2013  . Atrial fibrillation ([place]) 07/16/2013  . Paroxysmal atrial fibrillation ([place]) 07/16/2013  . Ischemic cardiomyopathy 07/16/2013  . Essential hypertension 07/16/2013  . Chronic systolic heart failure ([place]) 04/12/2012  . Coronary artery disease    . Dyspnea 03/01/2012  . Preop cardiovascular exam 03/01/2012    Lavone Neri, SPT 02/22/2015, 3:02 PM  [place] [facility] [address] [place], [place], [address] Phone: [phone]   Fax:  [phone]  Name: Annelies Coyt MRN: [id number] Date of Birth: August 02, 1950

## 2015-02-23 ENCOUNTER — Encounter: Payer: Medicare Other | Attending: Family Medicine | Admitting: Dietician

## 2015-02-23 VITALS — Ht 67.0 in | Wt 232.4 lb

## 2015-02-23 DIAGNOSIS — E119 Type 2 diabetes mellitus without complications: Secondary | ICD-10-CM | POA: Diagnosis not present

## 2015-02-23 NOTE — Patient Instructions (Signed)
Balance meals with protein, carbohydrate and non-starchy vegetables. Estimate carbohydrate servings at meals and snacks. Include 2-3 servings of carbohydrate at meals and 1 at snacks. Spread 10 servings of carbohydrate over 3 meals and 2 snacks. Measure some portions of carbohydrate foods, especially starches. Read labels for carbohydrate grams remembering that 15 gms of carbohydrate equals one serving.

## 2015-02-23 NOTE — Progress Notes (Signed)
Medical Nutrition Therapy: Visit start time: 13:15  end time: 2:10 Assessment:  Diagnosis: Type 2 Diabetes Past medical history: hypertension, CAD Psychosocial issues/ stress concerns: Pt. Rates her stress as moderate and indicates "ok" as to how she is dealing with her stress. Preferred learning method:  . Hands-on  Current weight: 232.4  Height: 67 in Medications, supplements: see list Progress and evaluation: Patient in for initial nutrition assessment. She reports she has made some positive diet changes in the past 2 months. She switched from regular Pepsi to water or diet Pepsi. She is also limiting sweets. Her weight today is 2.5 lbs less than 1 month ago. She likes vegetables but is having to limit greens and others high in Vitamin K due to warfarin interaction. She eats during the night but states, she doesn't think she is hungry; just out of  habit from working night hours in the past.She is seen for physical therapy 2 days per week and then does the exercises at home recommended by therapist    Dietary Intake:  Usual eating pattern includes 3 meals and 2-3 snacks per day. Dining out frequency: 1-2 meals per week.  Breakfast: 7:30am- cereal bar, coffee with 2% milk or hot tea Lunch: 12:00- leftovers from dinner or ham or chicken sandwich, diet pepsi Supper:4-6:00pm- Healthy Choice dinner or pasta or chicken and some type of starch,  Snack: 9:00pm chicken leg for example or fruit Snack: 2:30am- leftover from dinner such as chicken or grapes Beverages: water, diet pepsi or hot tea  Nutrition Care Education: Diabetes:  Instructed on a meal plan for diabetes and to promote weight loss  based on 1500 calories including carbohydrate counting, portion control and balance of carbohydrate, protein and non-starchy vegetables (those allowed). Used food guide plate, food models and hand-outs to demonstrate. Also, instructed on basic label reading.  Nutritional Diagnosis:  NI-5.8.4 Inconsistent  carbohydrate intake As related to lack of understanding regarding carbohydrate foods.  As evidenced by diet history..  Intervention:  Balance meals with protein, carbohydrate and non-starchy vegetables. Estimate carbohydrate servings at meals and snacks. Include 2-3 servings of carbohydrate at meals and 1 at snacks. Spread 10 servings of carbohydrate over 3 meals and 2 snacks. Measure some portions of carbohydrate foods, especially starches. Read labels for carbohydrate grams remembering that 15 gms of carbohydrate equals one serving.  Education Materials given:  . Plate Planner . Food lists/ Planning A Balanced Meal . Sample meal pattern/ menus . Goals/ instructions Learner/ who was taught:  . Patient   Level of understanding: . Partial understanding; needs review/ practice Learning barriers: . None Willingness to learn/ readiness for change: . Eager, change in progress  Monitoring and Evaluation:  No follow-up scheduled. Patient keeps her 3 grandchildren during the day and said it is difficult to schedule more appointments. Gave her my name/phone number and encouraged her to call if has questions or concerns regarding her diet/nutrition.

## 2015-02-24 ENCOUNTER — Ambulatory Visit: Payer: Medicare Other | Admitting: Physical Therapy

## 2015-02-25 ENCOUNTER — Other Ambulatory Visit: Payer: Self-pay | Admitting: Cardiovascular Disease

## 2015-03-01 ENCOUNTER — Ambulatory Visit: Payer: Medicare Other | Admitting: Physical Therapy

## 2015-03-01 ENCOUNTER — Encounter: Payer: Self-pay | Admitting: Physical Therapy

## 2015-03-01 DIAGNOSIS — R269 Unspecified abnormalities of gait and mobility: Secondary | ICD-10-CM

## 2015-03-01 DIAGNOSIS — R5381 Other malaise: Secondary | ICD-10-CM

## 2015-03-01 DIAGNOSIS — R531 Weakness: Secondary | ICD-10-CM

## 2015-03-01 NOTE — Therapy (Signed)
[place] [facility] [address] [place], [place], [address] Phone: [phone]   Fax:  [phone]  Physical Therapy Treatment  Patient Details  Name: Delbra Zellars MRN: [id number] Date of Birth: [birth year] No Data Recorded  Encounter Date: 03/01/2015      PT End of Session - 03/01/15 1546    Visit Number 11   Number of Visits 14   Date for PT Re-Evaluation 03/03/15   Authorization - Visit Number 11   Authorization - Number of Visits 18   PT Start Time 0174   PT Stop Time 1202   PT Time Calculation (min) 46 min   Activity Tolerance Patient tolerated treatment well;No increased pain   Behavior During Therapy [facility] for tasks assessed/performed      Past Medical History  Diagnosis Date  . Hypertension   . Diabetes mellitus without complication ([place])     Type II  . Hyperlipidemia   . Stroke [facility]) 2013  . Coronary artery disease     Past Surgical History  Procedure Laterality Date  . Leg surgery      right;infection  . Carotid endarterectomy      [facility]; Dr. Lucky Cowboy  . Cholecystectomy      There were no vitals filed for this visit.  Visit Diagnosis:  Gait difficulty  Generalized weakness  Physical deconditioning      Subjective Assessment - 03/01/15 1546    Subjective Pt reports doing ok and no new complaints today. Pt states she was tired last week and unable to make her Wednesday appointment last week.    Limitations Standing;Lifting;House hold activities;Walking   How long can you stand comfortably? 5 mins   How long can you walk comfortably? < 5 mins   Patient Stated Goals regain her strength/be safe with walking without having to use a cane   Currently in Pain? No/denies        OBJECTIVE: There ex: Nustep L7 5 mins (no charge). Sideways marching with B UE support in the // bars x 6. Heel to toe walking in the // bars with light touch UE support x 6. Pt able to ambulate backwards without heel to toe pattern but  demonstrates good hip extension. Forwards marching in the // bars with Light touch UE support x 6. Resisted side stepping with 2 BTB x 10 each side. Pt required verbal cues to keep from demonstrating a trunk lean with side stepping. Standing squats with green therapy ball 15 x 2. Pt required minimal verbal cuing for form initially but able to maintain without assistance or cuing. In supine, pt performed knees to chest and bridging on white therapy ball x30 each. Pt demonstrates proper form and reports no increase in knee pain with bridging. Neuro re-ed: Balancing with eyes open and eyes closed on firm and foam with various bases of support. On Airex, eyes closed 30 seconds x 3 with hand to regain balance. Pt demonstrates poor ankle strategies and falls into way being pushed. Adducted stance for 1 min on firm and foam with no UE support.   Pt response to tx for medical necessity: Pt benefits from balance challenges in order to increase safety with functional mobility. Pt benefits from strength training to help improve overall deconditioning.           PT Long Term Goals - 02/10/15 1120    PT LONG TERM GOAL #1   Title Pt will improve BERG score to > 49/56 in order to safely  ambulate community distances without use of SPC.   Baseline 48/56 on 11/2   Time 3   Period Weeks   Status Partially Met   PT LONG TERM GOAL #2   Title Pt will improve LEFS to >50/80 in order to improve functional mobility.   Baseline 47/80 on 11/2   Time 3   Period Weeks   Status Partially Met   PT LONG TERM GOAL #3   Title Pt will be independent with HEP in order to increase B hip flexion by 1/2 MMT to decrease flat foot with walking.   Baseline toe drag still noted and inconsistent compliance with HEP.    Time 3   Period Weeks   Status On-going   PT LONG TERM GOAL #4   Title Pt will complete a 6 MWT and walk for 500 ft without use of SPC and no rest breaks in order to demonstrate an increase in endurance.   Baseline  2 standing rest breaks in hallway without use of SPC.  462 feet.     Time 4   Period Weeks   Status Revised   PT LONG TERM GOAL #5   Title Pt will demonstrate an increase in safety with outdoor ambulation with consistent decrease in toe drag and increase of use of SPC.   Baseline pt will purchase [facility] and demonstrate proper techinque with SPC.   Time 3   Period Weeks   Status New   Additional Long Term Goals   Additional Long Term Goals Yes   PT LONG TERM GOAL #6   Title Pt will ambulate on level and unlevel surfaces with no AD for 10 minutes with no shortness of breath or increased fatigue in order to grocery shop safely.   Time 3   Period Weeks   Status Not Met               Plan - 03/01/15 1547    Clinical Impression Statement Pt ambulates with antaglic gait pattern and decreased R step length. Pt demonstrates proper squatting form with green therapy ball to give postural cues. Pt requires occasional verbal cues to maintain knees behind her toes but demonstrates good form. Pt is able to hold eyes closed balance on Airex for 30 seconds. Pt demonstrates poor compensatory strategies and tends to lean the way she is falling.    Pt will benefit from skilled therapeutic intervention in order to improve on the following deficits Abnormal gait;Decreased activity tolerance;Difficulty walking;Obesity;Improper body mechanics;Postural dysfunction;Impaired sensation;Increased edema;Decreased strength;Decreased mobility;Decreased balance;Impaired flexibility   Rehab Potential Good   PT Frequency 2x / week   PT Duration 3 weeks   PT Treatment/Interventions ADLs/Self Care Home Management;Cryotherapy;Moist Heat;Balance training;Therapeutic exercise;Manual techniques;Therapeutic activities;Functional mobility training;Stair training;Gait training;Patient/family education;Neuromuscular re-education;Energy conservation   PT Next Visit Plan balance/gait/strengthening in pain free range/new HEP for  home.   PT Home Exercise Plan continue current one    Consulted and Agree with Plan of Care Patient        Problem List Patient Active Problem List   Diagnosis Date Noted  . Encounter for therapeutic drug monitoring 07/16/2013  . Atrial fibrillation ([place]) 07/16/2013  . Paroxysmal atrial fibrillation ([place]) 07/16/2013  . Ischemic cardiomyopathy 07/16/2013  . Essential hypertension 07/16/2013  . Chronic systolic heart failure ([place]) 04/12/2012  . Coronary artery disease   . Dyspnea 03/01/2012  . Preop cardiovascular exam 03/01/2012   Pura Spice, PT, DPT # [id number]   03/01/2015, 4:54 PM  [place]  [facility] [address] [place], [place], [address] Phone: [phone]   Fax:  [phone]  Name: Tynesia Harral MRN: [id number] Date of Birth: October 07, 1950

## 2015-03-03 ENCOUNTER — Ambulatory Visit: Payer: Medicare Other | Admitting: Physical Therapy

## 2015-03-10 ENCOUNTER — Encounter: Payer: Self-pay | Admitting: Physical Therapy

## 2015-03-10 ENCOUNTER — Ambulatory Visit: Payer: Medicare Other | Admitting: Physical Therapy

## 2015-03-10 DIAGNOSIS — R269 Unspecified abnormalities of gait and mobility: Secondary | ICD-10-CM | POA: Diagnosis not present

## 2015-03-10 DIAGNOSIS — R5381 Other malaise: Secondary | ICD-10-CM | POA: Diagnosis not present

## 2015-03-10 DIAGNOSIS — R531 Weakness: Secondary | ICD-10-CM | POA: Diagnosis not present

## 2015-03-10 NOTE — Therapy (Signed)
[facility] [address] [place], [place], [address] Phone: [phone]   Fax:  [phone]  Physical Therapy Treatment  Patient Details  Name: Meghan [name] MRN: [id number] Date of Birth: [birth year] No Data Recorded  Encounter Date: 03/10/2015      PT End of Session - 03/10/15 1337    Visit Number 12   Number of Visits 14   Date for PT Re-Evaluation 03/03/15   Authorization - Visit Number 12   Authorization - Number of Visits 18   PT Start Time 1107   PT Stop Time 6962   PT Time Calculation (min) 58 min   Activity Tolerance Patient tolerated treatment well;No increased pain;Patient limited by fatigue   Behavior During Therapy [facility] for tasks assessed/performed      Past Medical History  Diagnosis Date  . Hypertension   . Diabetes mellitus without complication ([place])     Type II  . Hyperlipidemia   . Stroke [facility]) 2013  . Coronary artery disease     Past Surgical History  Procedure Laterality Date  . Leg surgery      right;infection  . Carotid endarterectomy      [facility]; Dr. Lucky Cowboy  . Cholecystectomy      There were no vitals filed for this visit.  Visit Diagnosis:  Gait difficulty  Generalized weakness  Physical deconditioning      Subjective Assessment - 03/10/15 1336    Subjective Pt reports that her L knee is occassionally twisting and buckling under her while moving around her home. Pt states she has not fallen but is having knee pain.   Limitations Standing;Lifting;House hold activities;Walking   How long can you stand comfortably? 5 mins   How long can you walk comfortably? < 5 mins   Patient Stated Goals regain her strength/be safe with walking without having to use a cane   Currently in Pain? No/denies      OBJECTIVE: There ex: Resisted gait all 4 planes with 2 BTB x 10 each. Decreased step length with R side stepping. 6" step ups forwards with no UE support x 20. Pt was able to perform step ups with  minimal verbal cues for no UE support. Seated LAQ/hip abduction/marching x 20 with 2.5# weights on ankles. Pt fatigued with standing activities and reported an increase in L knee pain. Neuro re-ed: On airex, toe raises, heel raises, marching x 30 each. Pt required one UE support on // bars to maintain her balance. Pt was able to hold eyes closed/chin to ceiling without loss of balance. Gait training: in the // bars, walking on toes and heels x 4 each. Tandem walking x 4 with no UE support. Ambulation with no AD over unlevel ground with decreased stance time and cadence. Pt educated on importance of staying active and safety with gait training.  Pt response to tx for medical necessity: Pt benefits from balance and gait training to increase independence and safety with functional mobility. Pt benefits from strengthening to help balance issues. At this time, pt is discharged per pt's request to HEP.       PT Long Term Goals - 03/10/15 1340    PT LONG TERM GOAL #1   Title Pt will improve BERG score to > 49/56 in order to safely ambulate community distances without use of SPC.   Baseline 48/56 on 11/2   Time 3   Period Weeks   Status Not Met   PT LONG TERM GOAL #2  Title Pt will improve LEFS to >50/80 in order to improve functional mobility.   Baseline 47/80 on 11/2   Time 3   Period Weeks   Status Not Met   PT LONG TERM GOAL #3   Title Pt will be independent with HEP in order to increase B hip flexion by 1/2 MMT to decrease flat foot with walking.   Baseline toe drag still noted and inconsistent compliance with HEP.    Time 3   Period Weeks   Status Achieved   PT LONG TERM GOAL #4   Title Pt will complete a 6 MWT and walk for 500 ft without use of SPC and no rest breaks in order to demonstrate an increase in endurance.   Baseline 2 standing rest breaks in hallway without use of SPC.  462 feet.     Time 4   Period Weeks   Status Partially Met   PT LONG TERM GOAL #5   Title Pt will  demonstrate an increase in safety with outdoor ambulation with consistent decrease in toe drag and increase of use of SPC.   Baseline pt will purchase [facility] and demonstrate proper techinque with SPC.   Time 3   Period Weeks   Status Not Met   PT LONG TERM GOAL #6   Title Pt will ambulate on level and unlevel surfaces with no AD for 10 minutes with no shortness of breath or increased fatigue in order to grocery shop safely.   Time 3   Period Weeks   Status Not Met            Plan - 03/10/15 1337    Clinical Impression Statement Pt ambulates consistenlty without use of an AD with decreased R stance time and increased R hip height. Pt is able to correct with verbal cuing for hip hike and foot flat, but unable to maintain connections. Pt has progressed with balance to perform higher level balance challenges with no limitations besides vertigo. Pt demonstrates an increase in confience in her balance as evient by her ability to maintain tandem stance for 30 seconds without UE support. Pt dishcarged to home program at this time per her request. Pt educated on importance of staying active and to call PT if any problems arise.   Pt will benefit from skilled therapeutic intervention in order to improve on the following deficits Abnormal gait;Decreased activity tolerance;Difficulty walking;Obesity;Improper body mechanics;Postural dysfunction;Impaired sensation;Increased edema;Decreased strength;Decreased mobility;Decreased balance;Impaired flexibility   Rehab Potential Good   PT Frequency 2x / week   PT Duration 3 weeks   PT Treatment/Interventions ADLs/Self Care Home Management;Cryotherapy;Moist Heat;Balance training;Therapeutic exercise;Manual techniques;Therapeutic activities;Functional mobility training;Stair training;Gait training;Patient/family education;Neuromuscular re-education;Energy conservation   PT Home Exercise Plan continue current one    Consulted and Agree with Plan of Care Patient         Problem List Patient Active Problem List   Diagnosis Date Noted  . Encounter for therapeutic drug monitoring 07/16/2013  . Atrial fibrillation ([place]) 07/16/2013  . Paroxysmal atrial fibrillation ([place]) 07/16/2013  . Ischemic cardiomyopathy 07/16/2013  . Essential hypertension 07/16/2013  . Chronic systolic heart failure ([place]) 04/12/2012  . Coronary artery disease   . Dyspnea 03/01/2012  . Preop cardiovascular exam 03/01/2012    Lavone Neri, SPT 03/10/2015, 1:44 PM  [facility] [address] [place], [place], [address] Phone: [phone]   Fax:  [phone]  Name: Meghan [name] MRN: [id number] Date of Birth: 09/15/1950

## 2015-03-17 ENCOUNTER — Ambulatory Visit (INDEPENDENT_AMBULATORY_CARE_PROVIDER_SITE_OTHER): Payer: Medicare Other

## 2015-03-17 DIAGNOSIS — Z5181 Encounter for therapeutic drug level monitoring: Secondary | ICD-10-CM

## 2015-03-17 DIAGNOSIS — I4891 Unspecified atrial fibrillation: Secondary | ICD-10-CM | POA: Diagnosis not present

## 2015-03-17 DIAGNOSIS — E11621 Type 2 diabetes mellitus with foot ulcer: Secondary | ICD-10-CM | POA: Diagnosis not present

## 2015-03-17 DIAGNOSIS — E1165 Type 2 diabetes mellitus with hyperglycemia: Secondary | ICD-10-CM | POA: Diagnosis not present

## 2015-03-17 DIAGNOSIS — L97509 Non-pressure chronic ulcer of other part of unspecified foot with unspecified severity: Secondary | ICD-10-CM | POA: Diagnosis not present

## 2015-03-17 LAB — POCT INR: INR: 1.7

## 2015-04-28 ENCOUNTER — Other Ambulatory Visit: Payer: Self-pay | Admitting: Cardiovascular Disease

## 2015-04-28 NOTE — Telephone Encounter (Signed)
Please review for refill, Thank you. 

## 2015-04-28 NOTE — Telephone Encounter (Signed)
Please review for refill. Thanks!  

## 2015-04-28 NOTE — Telephone Encounter (Signed)
Enough given to get to appointment for INR on 05/05/15. No further refills until seen in Coumadin clinic.

## 2015-05-05 ENCOUNTER — Ambulatory Visit (INDEPENDENT_AMBULATORY_CARE_PROVIDER_SITE_OTHER): Payer: Medicare Other

## 2015-05-05 DIAGNOSIS — Z5181 Encounter for therapeutic drug level monitoring: Secondary | ICD-10-CM

## 2015-05-05 DIAGNOSIS — I4891 Unspecified atrial fibrillation: Secondary | ICD-10-CM | POA: Diagnosis not present

## 2015-05-05 LAB — POCT INR: INR: 1.6

## 2015-05-05 MED ORDER — WARFARIN SODIUM 6 MG PO TABS: ORAL_TABLET | ORAL | Status: DC

## 2015-05-26 ENCOUNTER — Ambulatory Visit (INDEPENDENT_AMBULATORY_CARE_PROVIDER_SITE_OTHER): Payer: Medicare Other

## 2015-05-26 DIAGNOSIS — I4891 Unspecified atrial fibrillation: Secondary | ICD-10-CM | POA: Diagnosis not present

## 2015-05-26 DIAGNOSIS — Z5181 Encounter for therapeutic drug level monitoring: Secondary | ICD-10-CM

## 2015-05-26 LAB — POCT INR: INR: 2.4

## 2015-06-04 ENCOUNTER — Other Ambulatory Visit: Payer: Self-pay | Admitting: *Deleted

## 2015-06-04 MED ORDER — WARFARIN SODIUM 6 MG PO TABS: ORAL_TABLET | ORAL | Status: DC

## 2015-06-04 NOTE — Telephone Encounter (Signed)
Refill done as requested 

## 2015-07-07 ENCOUNTER — Ambulatory Visit (INDEPENDENT_AMBULATORY_CARE_PROVIDER_SITE_OTHER): Payer: Medicare Other

## 2015-07-07 DIAGNOSIS — I4891 Unspecified atrial fibrillation: Secondary | ICD-10-CM

## 2015-07-07 DIAGNOSIS — Z5181 Encounter for therapeutic drug level monitoring: Secondary | ICD-10-CM

## 2015-07-07 LAB — POCT INR: INR: 2.3

## 2015-08-02 ENCOUNTER — Other Ambulatory Visit: Payer: Self-pay | Admitting: Cardiovascular Disease

## 2015-08-04 ENCOUNTER — Ambulatory Visit (INDEPENDENT_AMBULATORY_CARE_PROVIDER_SITE_OTHER): Payer: Medicare Other

## 2015-08-04 DIAGNOSIS — I4891 Unspecified atrial fibrillation: Secondary | ICD-10-CM

## 2015-08-04 DIAGNOSIS — Z5181 Encounter for therapeutic drug level monitoring: Secondary | ICD-10-CM | POA: Diagnosis not present

## 2015-08-04 LAB — POCT INR: INR: 3.3

## 2015-08-13 DIAGNOSIS — E1142 Type 2 diabetes mellitus with diabetic polyneuropathy: Secondary | ICD-10-CM | POA: Diagnosis not present

## 2015-08-13 DIAGNOSIS — I10 Essential (primary) hypertension: Secondary | ICD-10-CM | POA: Diagnosis not present

## 2015-08-13 DIAGNOSIS — E1165 Type 2 diabetes mellitus with hyperglycemia: Secondary | ICD-10-CM | POA: Diagnosis not present

## 2015-09-01 ENCOUNTER — Ambulatory Visit (INDEPENDENT_AMBULATORY_CARE_PROVIDER_SITE_OTHER): Payer: Medicare Other | Admitting: *Deleted

## 2015-09-01 DIAGNOSIS — Z5181 Encounter for therapeutic drug level monitoring: Secondary | ICD-10-CM

## 2015-09-01 DIAGNOSIS — I4891 Unspecified atrial fibrillation: Secondary | ICD-10-CM

## 2015-09-01 LAB — POCT INR: INR: 2.3

## 2015-09-24 DIAGNOSIS — E1142 Type 2 diabetes mellitus with diabetic polyneuropathy: Secondary | ICD-10-CM | POA: Diagnosis not present

## 2015-09-24 DIAGNOSIS — I10 Essential (primary) hypertension: Secondary | ICD-10-CM | POA: Diagnosis not present

## 2015-09-24 DIAGNOSIS — E1165 Type 2 diabetes mellitus with hyperglycemia: Secondary | ICD-10-CM | POA: Diagnosis not present

## 2015-09-27 ENCOUNTER — Other Ambulatory Visit: Payer: Self-pay

## 2015-09-27 MED ORDER — METOPROLOL TARTRATE 25 MG PO TABS: 25.0000 mg | ORAL_TABLET | Freq: Two times a day (BID) | ORAL | Status: DC

## 2015-09-29 ENCOUNTER — Ambulatory Visit (INDEPENDENT_AMBULATORY_CARE_PROVIDER_SITE_OTHER): Payer: Medicare Other

## 2015-09-29 DIAGNOSIS — I4891 Unspecified atrial fibrillation: Secondary | ICD-10-CM | POA: Diagnosis not present

## 2015-09-29 DIAGNOSIS — Z5181 Encounter for therapeutic drug level monitoring: Secondary | ICD-10-CM | POA: Diagnosis not present

## 2015-09-29 LAB — POCT INR: INR: 1.9

## 2015-10-14 ENCOUNTER — Ambulatory Visit (INDEPENDENT_AMBULATORY_CARE_PROVIDER_SITE_OTHER): Payer: Medicare Other | Admitting: Cardiovascular Disease

## 2015-10-14 ENCOUNTER — Encounter: Payer: Self-pay | Admitting: Cardiovascular Disease

## 2015-10-14 VITALS — BP 140/100 | HR 75 | Ht 67.0 in | Wt 234.4 lb

## 2015-10-14 DIAGNOSIS — I2510 Atherosclerotic heart disease of native coronary artery without angina pectoris: Secondary | ICD-10-CM

## 2015-10-14 MED ORDER — CARVEDILOL 6.25 MG PO TABS: 6.2500 mg | ORAL_TABLET | Freq: Two times a day (BID) | ORAL | Status: DC

## 2015-10-14 NOTE — Patient Instructions (Signed)
Medication Instructions:  Your physician has recommended you make the following change in your medication:  STOP taking metoprolol START taking coreg 6.25mg  twice daily   Labwork: none  Testing/Procedures: none  Follow-Up: Your physician wants you to follow-up in: six months with Dr. Fletcher Anon.  You will receive a reminder letter in the mail two months in advance. If you don't receive a letter, please call our office to schedule the follow-up appointment.   Any Other Special Instructions Will Be Listed Below (If Applicable).     If you need a refill on your cardiac medications before your next appointment, please call your pharmacy.

## 2015-10-14 NOTE — Progress Notes (Signed)
Cardiology Office Note   Date:  10/14/2015   ID:  Meghan [name], DOB 08-28-1950, MRN [id number]  PCP:  Sofie Hartigan, MD  Cardiologist:   Kathlyn Sacramento, MD  Endocrinologist: Dr. Atha Starks  Chief Complaint  Patient presents with  . Follow-up    NO CP, NO SOB OR SWELLING      History of Present Illness: Meghan [name] is a 65 y.o. female who presents for a followup visit regarding cardiomyopathy, presumed underlying coronary artery disease and atrial fibrillation. Other medical problems include hypertension, 2 diabetes, hyperlipidemia and carotid artery disease status post left carotid endarterectomy with previous CVA.  She was evaluated in 2013 initially in the setting of acute CVA. A 2D echo was performed revealing EF 45-50% and anterior HK, severe apical HK, impaired LV relaxation, mild LA dilatation noted at that time. She subsequently underwent Lexiscan Myoview confirming prior anterior infarct, no evidence of ischemia. EF 41%. She was treated medically with ASA, BB, statin. She followed up 04/2012. No anginal or CHF type symptoms. Declined outpatient cath.  She had atrial fibrillation in the setting of acute cholecystitis in March 2015. Echocardiogram while hospitalized showed an ejection fraction of 35-40%. No recurrent atrial fibrillation even after stopping amiodarone. She has been on anticoagulation with warfarin. She has been doing well and denies chest pain, dyspnea or palpitations.   Her diabetes has not been well-controlled. She reports running out of metoprolol about one month ago.  Past Medical History  Diagnosis Date  . Hypertension   . Diabetes mellitus without complication ([place])     Type II  . Hyperlipidemia   . Stroke [facility]) 2013  . Coronary artery disease     Past Surgical History  Procedure Laterality Date  . Leg surgery      right;infection  . Carotid endarterectomy      [facility]; Dr. Lucky Cowboy  . Cholecystectomy       Current Outpatient Prescriptions    Medication Sig Dispense Refill  . atorvastatin (LIPITOR) 10 MG tablet TAKE ONE TABLET BY MOUTH ONCE DAILY 90 tablet 3  . furosemide (LASIX) 20 MG tablet TAKE ONE TABLET BY MOUTH ONCE DAILY 90 tablet 3  . glipiZIDE (GLUCOTROL) 10 MG tablet Take 10 mg by mouth 2 (two) times daily.    Marland Kitchen lisinopril (PRINIVIL,ZESTRIL) 10 MG tablet TAKE ONE TABLET BY MOUTH ONCE DAILY 90 tablet 3  . metFORMIN (GLUCOPHAGE) 500 MG tablet Take 500 mg by mouth 2 (two) times daily.    . Omega-3 Fatty Acids (FISH OIL) 1200 MG CAPS Take by mouth daily.    Marland Kitchen warfarin (COUMADIN) 6 MG tablet Take as directed by Coumadin Clinic 120 tablet 1  . carvedilol (COREG) 6.25 MG tablet Take 1 tablet (6.25 mg total) by mouth 2 (two) times daily. 60 tablet 5   No current facility-administered medications for this visit.    Allergies:   Review of patient's allergies indicates no known allergies.    Social History:  The patient  reports that she has quit smoking. Her smoking use included Cigarettes. She has a 2.5 pack-year smoking history. She does not have any smokeless tobacco history on file. She reports that she does not drink alcohol or use illicit drugs.   Family History:  The patient's family history includes Heart disease in her mother.    ROS:  Please see the history of present illness.   Otherwise, review of systems are positive for none.   All other systems are reviewed and negative.  PHYSICAL EXAM: VS:  BP 140/100 mmHg  Pulse 75  Ht 5\' 7"  (1.702 m)  Wt 234 lb 6.4 oz (106.323 kg)  BMI 36.70 kg/m2 , BMI Body mass index is 36.7 kg/(m^2). GEN: Well nourished, well developed, in no acute distress HEENT: normal Neck: no JVD, carotid bruits, or masses Cardiac: RRR; no murmurs, rubs, or gallops,no edema  Respiratory:  clear to auscultation bilaterally, normal work of breathing GI: soft, nontender, nondistended, + BS MS: no deformity or atrophy Skin: warm and dry, no rash Neuro:  Strength and sensation are  intact Psych: euthymic mood, full affect   EKG:  EKG is ordered today. The ekg ordered today demonstrates normal sinus rhythm with low voltage, old septal infarct.   Recent Labs: No results found for requested labs within last 365 days.    Lipid Panel    Component Value Date/Time   CHOL 165 02/14/2012 0410   TRIG 168 02/14/2012 0410   HDL 31* 02/14/2012 0410   VLDL 34 02/14/2012 0410   LDLCALC 100 02/14/2012 0410      Wt Readings from Last 3 Encounters:  10/14/15 234 lb 6.4 oz (106.323 kg)  02/23/15 232 lb 6.4 oz (105.416 kg)  01/14/15 235 lb 8 oz (106.822 kg)       ASSESSMENT AND PLAN:  1.   Paroxysmal atrial fibrillation: This was observed only in the setting of acute cholecystitis. No evidence of recurrent arrhythmia. Continue anticoagulation with warfarin given her high CHADs VASc score.   2. Chronic systolic heart failure: Most recent ejection fraction was 35-40%. I elected to switch metoprolol to carvedilol 6.25 mg twice daily and continue lisinopril. She appears to be euvolemic.  3. Essential hypertension: Blood pressure is mildly elevated. She has not been taking metoprolol. This was switched to carvedilol as outlined above.  4. Coronary artery disease involving native coronary arteries without angina: Continue medical therapy. I discussed with her the importance of controlling her risk factors.  5. Hyperlipidemia: Most recent LDL was 98. This is being followed by her endocrinologist. Given her diabetes and coronary artery disease, I recommend a target LDL of less than 70. Consider increasing the dose of atorvastatin.    Disposition:   FU with me in 6 months  Signed,  Kathlyn Sacramento, MD  10/14/2015 9:10 AM    [place]

## 2015-10-27 ENCOUNTER — Ambulatory Visit (INDEPENDENT_AMBULATORY_CARE_PROVIDER_SITE_OTHER): Payer: Medicare Other

## 2015-10-27 DIAGNOSIS — Z5181 Encounter for therapeutic drug level monitoring: Secondary | ICD-10-CM

## 2015-10-27 DIAGNOSIS — I4891 Unspecified atrial fibrillation: Secondary | ICD-10-CM | POA: Diagnosis not present

## 2015-10-27 LAB — POCT INR: INR: 2

## 2015-11-16 ENCOUNTER — Other Ambulatory Visit: Payer: Self-pay | Admitting: Cardiovascular Disease

## 2015-11-16 NOTE — Telephone Encounter (Signed)
Please review for refill. Thanks!  

## 2015-11-24 ENCOUNTER — Ambulatory Visit (INDEPENDENT_AMBULATORY_CARE_PROVIDER_SITE_OTHER): Payer: Medicare Other

## 2015-11-24 DIAGNOSIS — I4891 Unspecified atrial fibrillation: Secondary | ICD-10-CM | POA: Diagnosis not present

## 2015-11-24 DIAGNOSIS — Z5181 Encounter for therapeutic drug level monitoring: Secondary | ICD-10-CM | POA: Diagnosis not present

## 2015-11-24 LAB — POCT INR: INR: 2.3

## 2015-12-28 ENCOUNTER — Other Ambulatory Visit: Payer: Self-pay | Admitting: Cardiovascular Disease

## 2015-12-29 ENCOUNTER — Ambulatory Visit (INDEPENDENT_AMBULATORY_CARE_PROVIDER_SITE_OTHER): Payer: Medicare Other | Admitting: *Deleted

## 2015-12-29 DIAGNOSIS — I4891 Unspecified atrial fibrillation: Secondary | ICD-10-CM

## 2015-12-29 DIAGNOSIS — Z5181 Encounter for therapeutic drug level monitoring: Secondary | ICD-10-CM | POA: Diagnosis not present

## 2015-12-29 LAB — POCT INR: INR: 1.5

## 2016-01-05 ENCOUNTER — Ambulatory Visit (INDEPENDENT_AMBULATORY_CARE_PROVIDER_SITE_OTHER): Payer: Medicare Other

## 2016-01-05 DIAGNOSIS — Z5181 Encounter for therapeutic drug level monitoring: Secondary | ICD-10-CM | POA: Diagnosis not present

## 2016-01-05 DIAGNOSIS — I4891 Unspecified atrial fibrillation: Secondary | ICD-10-CM | POA: Diagnosis not present

## 2016-01-05 LAB — POCT INR: INR: 1.5

## 2016-01-19 ENCOUNTER — Ambulatory Visit (INDEPENDENT_AMBULATORY_CARE_PROVIDER_SITE_OTHER): Payer: Medicare Other

## 2016-01-19 DIAGNOSIS — Z5181 Encounter for therapeutic drug level monitoring: Secondary | ICD-10-CM

## 2016-01-19 DIAGNOSIS — I4891 Unspecified atrial fibrillation: Secondary | ICD-10-CM

## 2016-01-19 LAB — POCT INR: INR: 2.2

## 2016-02-04 DIAGNOSIS — E1165 Type 2 diabetes mellitus with hyperglycemia: Secondary | ICD-10-CM | POA: Diagnosis not present

## 2016-02-04 DIAGNOSIS — E1142 Type 2 diabetes mellitus with diabetic polyneuropathy: Secondary | ICD-10-CM | POA: Diagnosis not present

## 2016-02-09 ENCOUNTER — Ambulatory Visit (INDEPENDENT_AMBULATORY_CARE_PROVIDER_SITE_OTHER): Payer: Medicare Other

## 2016-02-09 DIAGNOSIS — I4891 Unspecified atrial fibrillation: Secondary | ICD-10-CM | POA: Diagnosis not present

## 2016-02-09 DIAGNOSIS — Z5181 Encounter for therapeutic drug level monitoring: Secondary | ICD-10-CM

## 2016-02-09 LAB — POCT INR: INR: 3.5

## 2016-02-11 DIAGNOSIS — E1142 Type 2 diabetes mellitus with diabetic polyneuropathy: Secondary | ICD-10-CM | POA: Diagnosis not present

## 2016-02-11 DIAGNOSIS — E1165 Type 2 diabetes mellitus with hyperglycemia: Secondary | ICD-10-CM | POA: Diagnosis not present

## 2016-02-11 DIAGNOSIS — I10 Essential (primary) hypertension: Secondary | ICD-10-CM | POA: Diagnosis not present

## 2016-02-17 ENCOUNTER — Other Ambulatory Visit: Payer: Self-pay | Admitting: Cardiovascular Disease

## 2016-03-17 DIAGNOSIS — E1165 Type 2 diabetes mellitus with hyperglycemia: Secondary | ICD-10-CM | POA: Diagnosis not present

## 2016-03-17 DIAGNOSIS — E1142 Type 2 diabetes mellitus with diabetic polyneuropathy: Secondary | ICD-10-CM | POA: Diagnosis not present

## 2016-05-09 ENCOUNTER — Other Ambulatory Visit: Payer: Self-pay | Admitting: Cardiovascular Disease

## 2016-05-09 NOTE — Telephone Encounter (Signed)
Please review for refill. Thanks!  

## 2016-05-10 ENCOUNTER — Other Ambulatory Visit: Payer: Self-pay

## 2016-05-10 NOTE — Telephone Encounter (Signed)
Pt scheduled appt for 05/17/16, will refill rx x 10 tablets only, a seven day supply to get pt to her next appt will only give 30 day refills in future secondary to non-compliance.  Pt's last INR was 02/09/16, pt was due back in office for INR on 03/01/17 for which she No Showed.

## 2016-05-17 ENCOUNTER — Ambulatory Visit (INDEPENDENT_AMBULATORY_CARE_PROVIDER_SITE_OTHER): Payer: Medicare Other

## 2016-05-17 DIAGNOSIS — I4891 Unspecified atrial fibrillation: Secondary | ICD-10-CM | POA: Diagnosis not present

## 2016-05-17 DIAGNOSIS — Z5181 Encounter for therapeutic drug level monitoring: Secondary | ICD-10-CM | POA: Diagnosis not present

## 2016-05-17 LAB — POCT INR: INR: 3.4

## 2016-05-17 MED ORDER — WARFARIN SODIUM 6 MG PO TABS: ORAL_TABLET | ORAL | 0 refills | Status: DC

## 2016-06-07 ENCOUNTER — Ambulatory Visit (INDEPENDENT_AMBULATORY_CARE_PROVIDER_SITE_OTHER): Payer: Medicare Other

## 2016-06-07 DIAGNOSIS — I4891 Unspecified atrial fibrillation: Secondary | ICD-10-CM | POA: Diagnosis not present

## 2016-06-07 DIAGNOSIS — Z5181 Encounter for therapeutic drug level monitoring: Secondary | ICD-10-CM | POA: Diagnosis not present

## 2016-06-07 LAB — POCT INR: INR: 1.6

## 2016-06-18 ENCOUNTER — Other Ambulatory Visit: Payer: Self-pay | Admitting: Cardiovascular Disease

## 2016-06-19 NOTE — Telephone Encounter (Signed)
Please review for refill. Thanks!  

## 2016-06-21 ENCOUNTER — Ambulatory Visit (INDEPENDENT_AMBULATORY_CARE_PROVIDER_SITE_OTHER): Payer: Medicare Other

## 2016-06-21 DIAGNOSIS — Z5181 Encounter for therapeutic drug level monitoring: Secondary | ICD-10-CM | POA: Diagnosis not present

## 2016-06-21 DIAGNOSIS — I4891 Unspecified atrial fibrillation: Secondary | ICD-10-CM | POA: Diagnosis not present

## 2016-06-21 LAB — POCT INR: INR: 3.3

## 2016-06-30 ENCOUNTER — Ambulatory Visit: Payer: Medicare Other | Admitting: Cardiovascular Disease

## 2016-07-13 ENCOUNTER — Other Ambulatory Visit: Payer: Self-pay | Admitting: Cardiovascular Disease

## 2016-07-26 ENCOUNTER — Telehealth: Payer: Self-pay | Admitting: Cardiovascular Disease

## 2016-07-26 ENCOUNTER — Emergency Department: Payer: Medicare Other

## 2016-07-26 ENCOUNTER — Encounter: Payer: Self-pay | Admitting: Medical Oncology

## 2016-07-26 ENCOUNTER — Inpatient Hospital Stay (HOSPITAL_COMMUNITY)
Admit: 2016-07-26 | Discharge: 2016-07-26 | Disposition: A | Discharge status: Home / Self Care | Payer: Medicare Other | Attending: Internal Medicine | Admitting: Internal Medicine

## 2016-07-26 ENCOUNTER — Inpatient Hospital Stay
Admission: EM | Admit: 2016-07-26 | Discharge: 2016-07-28 | DRG: 291 | Disposition: A | Discharge status: Home / Self Care | Payer: Medicare Other | Attending: Internal Medicine | Admitting: Internal Medicine

## 2016-07-26 ENCOUNTER — Inpatient Hospital Stay: Admit: 2016-07-26 | Payer: Medicare Other

## 2016-07-26 ENCOUNTER — Ambulatory Visit (INDEPENDENT_AMBULATORY_CARE_PROVIDER_SITE_OTHER): Payer: Medicare Other

## 2016-07-26 DIAGNOSIS — Z87891 Personal history of nicotine dependence: Secondary | ICD-10-CM | POA: Diagnosis not present

## 2016-07-26 DIAGNOSIS — I4891 Unspecified atrial fibrillation: Secondary | ICD-10-CM

## 2016-07-26 DIAGNOSIS — D649 Anemia, unspecified: Secondary | ICD-10-CM

## 2016-07-26 DIAGNOSIS — N183 Chronic kidney disease, stage 3 unspecified: Secondary | ICD-10-CM

## 2016-07-26 DIAGNOSIS — Z6836 Body mass index (BMI) 36.0-36.9, adult: Secondary | ICD-10-CM | POA: Diagnosis not present

## 2016-07-26 DIAGNOSIS — N179 Acute kidney failure, unspecified: Secondary | ICD-10-CM | POA: Diagnosis present

## 2016-07-26 DIAGNOSIS — I480 Paroxysmal atrial fibrillation: Secondary | ICD-10-CM | POA: Diagnosis present

## 2016-07-26 DIAGNOSIS — Z7901 Long term (current) use of anticoagulants: Secondary | ICD-10-CM

## 2016-07-26 DIAGNOSIS — I255 Ischemic cardiomyopathy: Secondary | ICD-10-CM | POA: Diagnosis present

## 2016-07-26 DIAGNOSIS — Z7984 Long term (current) use of oral hypoglycemic drugs: Secondary | ICD-10-CM

## 2016-07-26 DIAGNOSIS — E11621 Type 2 diabetes mellitus with foot ulcer: Secondary | ICD-10-CM | POA: Diagnosis present

## 2016-07-26 DIAGNOSIS — Z8673 Personal history of transient ischemic attack (TIA), and cerebral infarction without residual deficits: Secondary | ICD-10-CM

## 2016-07-26 DIAGNOSIS — I5023 Acute on chronic systolic (congestive) heart failure: Secondary | ICD-10-CM | POA: Insufficient documentation

## 2016-07-26 DIAGNOSIS — Z5181 Encounter for therapeutic drug level monitoring: Secondary | ICD-10-CM

## 2016-07-26 DIAGNOSIS — I481 Persistent atrial fibrillation: Secondary | ICD-10-CM | POA: Diagnosis present

## 2016-07-26 DIAGNOSIS — L97519 Non-pressure chronic ulcer of other part of right foot with unspecified severity: Secondary | ICD-10-CM | POA: Diagnosis present

## 2016-07-26 DIAGNOSIS — R0602 Shortness of breath: Secondary | ICD-10-CM | POA: Diagnosis not present

## 2016-07-26 DIAGNOSIS — E871 Hypo-osmolality and hyponatremia: Secondary | ICD-10-CM | POA: Diagnosis not present

## 2016-07-26 DIAGNOSIS — I5043 Acute on chronic combined systolic (congestive) and diastolic (congestive) heart failure: Secondary | ICD-10-CM | POA: Diagnosis not present

## 2016-07-26 DIAGNOSIS — E785 Hyperlipidemia, unspecified: Secondary | ICD-10-CM | POA: Diagnosis present

## 2016-07-26 DIAGNOSIS — Z79899 Other long term (current) drug therapy: Secondary | ICD-10-CM

## 2016-07-26 DIAGNOSIS — I10 Essential (primary) hypertension: Secondary | ICD-10-CM

## 2016-07-26 DIAGNOSIS — I130 Hypertensive heart and chronic kidney disease with heart failure and stage 1 through stage 4 chronic kidney disease, or unspecified chronic kidney disease: Secondary | ICD-10-CM | POA: Diagnosis not present

## 2016-07-26 DIAGNOSIS — I2510 Atherosclerotic heart disease of native coronary artery without angina pectoris: Secondary | ICD-10-CM | POA: Diagnosis present

## 2016-07-26 DIAGNOSIS — E1122 Type 2 diabetes mellitus with diabetic chronic kidney disease: Secondary | ICD-10-CM | POA: Diagnosis present

## 2016-07-26 DIAGNOSIS — I8290 Acute embolism and thrombosis of unspecified vein: Secondary | ICD-10-CM | POA: Diagnosis not present

## 2016-07-26 DIAGNOSIS — I110 Hypertensive heart disease with heart failure: Secondary | ICD-10-CM | POA: Diagnosis not present

## 2016-07-26 DIAGNOSIS — I509 Heart failure, unspecified: Secondary | ICD-10-CM | POA: Diagnosis not present

## 2016-07-26 DIAGNOSIS — I5033 Acute on chronic diastolic (congestive) heart failure: Secondary | ICD-10-CM | POA: Diagnosis not present

## 2016-07-26 HISTORY — DX: Morbid (severe) obesity due to excess calories: E66.01

## 2016-07-26 HISTORY — DX: Peripheral vascular disease, unspecified: I73.9

## 2016-07-26 HISTORY — DX: Disorder of arteries and arterioles, unspecified: I77.9

## 2016-07-26 LAB — TSH: TSH: 5.26 u[IU]/mL — AB (ref 0.350–4.500)

## 2016-07-26 LAB — COMPREHENSIVE METABOLIC PANEL
ALBUMIN: 3.5 g/dL (ref 3.5–5.0)
ALK PHOS: 79 U/L (ref 38–126)
ALT: 13 U/L — ABNORMAL LOW (ref 14–54)
AST: 23 U/L (ref 15–41)
Anion gap: 7 (ref 5–15)
BUN: 27 mg/dL — AB (ref 6–20)
CALCIUM: 8.9 mg/dL (ref 8.9–10.3)
CO2: 26 mmol/L (ref 22–32)
Chloride: 101 mmol/L (ref 101–111)
Creatinine, Ser: 1.22 mg/dL — ABNORMAL HIGH (ref 0.44–1.00)
GFR calc Af Amer: 52 mL/min — ABNORMAL LOW (ref >60)
GFR calc non Af Amer: 44 mL/min — ABNORMAL LOW (ref >60)
GLUCOSE: 200 mg/dL — AB (ref 65–99)
POTASSIUM: 4.8 mmol/L (ref 3.5–5.1)
SODIUM: 134 mmol/L — AB (ref 135–145)
TOTAL PROTEIN: 8 g/dL (ref 6.5–8.1)
Total Bilirubin: 1.5 mg/dL — ABNORMAL HIGH (ref 0.3–1.2)

## 2016-07-26 LAB — TROPONIN I
Troponin I: <0.03 ng/mL (ref <0.03)
Troponin I: <0.03 ng/mL (ref <0.03)

## 2016-07-26 LAB — CBC WITH DIFFERENTIAL/PLATELET
BASOS ABS: 0.2 10*3/uL — AB (ref 0–0.1)
Basophils Relative: 2 %
EOS PCT: 3 %
Eosinophils Absolute: 0.2 10*3/uL (ref 0–0.7)
HEMATOCRIT: 30.1 % — AB (ref 35.0–47.0)
Hemoglobin: 9.4 g/dL — ABNORMAL LOW (ref 12.0–16.0)
LYMPHS PCT: 16 %
Lymphs Abs: 1 10*3/uL (ref 1.0–3.6)
MCH: 26.3 pg (ref 26.0–34.0)
MCHC: 31.1 g/dL — AB (ref 32.0–36.0)
MCV: 84.6 fL (ref 80.0–100.0)
MONO ABS: 0.6 10*3/uL (ref 0.2–0.9)
MONOS PCT: 10 %
Neutro Abs: 4.6 10*3/uL (ref 1.4–6.5)
Neutrophils Relative %: 69 %
PLATELETS: 357 10*3/uL (ref 150–440)
RBC: 3.56 MIL/uL — ABNORMAL LOW (ref 3.80–5.20)
RDW: 18.5 % — AB (ref 11.5–14.5)
WBC: 6.6 10*3/uL (ref 3.6–11.0)

## 2016-07-26 LAB — BRAIN NATRIURETIC PEPTIDE: B NATRIURETIC PEPTIDE 5: 1062 pg/mL — AB (ref 0.0–100.0)

## 2016-07-26 LAB — PROTIME-INR
INR: 2.85
PROTHROMBIN TIME: 30.5 s — AB (ref 11.4–15.2)

## 2016-07-26 LAB — T4, FREE: FREE T4: 1.17 ng/dL — AB (ref 0.61–1.12)

## 2016-07-26 LAB — GLUCOSE, CAPILLARY
Glucose-Capillary: 166 mg/dL — ABNORMAL HIGH (ref 65–99)
Glucose-Capillary: 146 mg/dL — ABNORMAL HIGH (ref 65–99)

## 2016-07-26 LAB — POCT INR: INR: 3.7

## 2016-07-26 MED ORDER — LISINOPRIL 10 MG PO TABS
10.0000 mg | ORAL_TABLET | Freq: Every day | ORAL | Status: DC
  Administered 2016-07-26 – 2016-07-28 (×3): 10 mg via ORAL
  Filled 2016-07-26 (×3): qty 1

## 2016-07-26 MED ORDER — INSULIN ASPART 100 UNIT/ML ~~LOC~~ SOLN: 0.0000 [IU] | Freq: Every day | SUBCUTANEOUS | Status: DC

## 2016-07-26 MED ORDER — DOCUSATE SODIUM 100 MG PO CAPS
100.0000 mg | ORAL_CAPSULE | Freq: Two times a day (BID) | ORAL | Status: DC
  Administered 2016-07-28: 100 mg via ORAL
  Filled 2016-07-26 (×3): qty 1

## 2016-07-26 MED ORDER — FUROSEMIDE 10 MG/ML IJ SOLN
40.0000 mg | Freq: Two times a day (BID) | INTRAMUSCULAR | Status: DC
  Administered 2016-07-27 (×2): 40 mg via INTRAVENOUS
  Filled 2016-07-26 (×2): qty 4

## 2016-07-26 MED ORDER — METOPROLOL TARTRATE 5 MG/5ML IV SOLN
2.5000 mg | Freq: Once | INTRAVENOUS | Status: AC
  Administered 2016-07-26: 2.5 mg via INTRAVENOUS
  Filled 2016-07-26: qty 5

## 2016-07-26 MED ORDER — ACETAMINOPHEN 325 MG PO TABS: 650.0000 mg | ORAL_TABLET | Freq: Four times a day (QID) | ORAL | Status: DC | PRN

## 2016-07-26 MED ORDER — METOPROLOL TARTRATE 25 MG PO TABS
25.0000 mg | ORAL_TABLET | Freq: Four times a day (QID) | ORAL | Status: DC
  Administered 2016-07-26 – 2016-07-27 (×6): 25 mg via ORAL
  Filled 2016-07-26 (×7): qty 1

## 2016-07-26 MED ORDER — ATORVASTATIN CALCIUM 10 MG PO TABS
10.0000 mg | ORAL_TABLET | Freq: Every day | ORAL | Status: DC
  Administered 2016-07-26 – 2016-07-28 (×3): 10 mg via ORAL
  Filled 2016-07-26 (×3): qty 1

## 2016-07-26 MED ORDER — WARFARIN - PHARMACIST DOSING INPATIENT
Freq: Every day | Status: DC
  Administered 2016-07-26: 17:00:00

## 2016-07-26 MED ORDER — INSULIN ASPART 100 UNIT/ML ~~LOC~~ SOLN
4.0000 [IU] | Freq: Three times a day (TID) | SUBCUTANEOUS | Status: DC
  Administered 2016-07-26 – 2016-07-28 (×5): 4 [IU] via SUBCUTANEOUS
  Filled 2016-07-26 (×5): qty 4

## 2016-07-26 MED ORDER — FUROSEMIDE 10 MG/ML IJ SOLN
20.0000 mg | Freq: Two times a day (BID) | INTRAMUSCULAR | Status: DC
  Administered 2016-07-26: 20 mg via INTRAVENOUS
  Filled 2016-07-26: qty 2

## 2016-07-26 MED ORDER — INSULIN ASPART 100 UNIT/ML ~~LOC~~ SOLN
0.0000 [IU] | Freq: Three times a day (TID) | SUBCUTANEOUS | Status: DC
  Administered 2016-07-26: 3 [IU] via SUBCUTANEOUS
  Administered 2016-07-27: 2 [IU] via SUBCUTANEOUS
  Administered 2016-07-27: 3 [IU] via SUBCUTANEOUS
  Administered 2016-07-28: 2 [IU] via SUBCUTANEOUS
  Filled 2016-07-26: qty 2
  Filled 2016-07-26 (×2): qty 3
  Filled 2016-07-26 (×2): qty 2
  Filled 2016-07-26: qty 3

## 2016-07-26 MED ORDER — ONDANSETRON HCL 4 MG PO TABS: 4.0000 mg | ORAL_TABLET | Freq: Four times a day (QID) | ORAL | Status: DC | PRN

## 2016-07-26 MED ORDER — WARFARIN SODIUM 1 MG PO TABS
9.0000 mg | ORAL_TABLET | Freq: Once | ORAL | Status: AC
  Administered 2016-07-26: 9 mg via ORAL
  Filled 2016-07-26: qty 4

## 2016-07-26 MED ORDER — FUROSEMIDE 10 MG/ML IJ SOLN
40.0000 mg | Freq: Once | INTRAMUSCULAR | Status: AC
  Administered 2016-07-26: 40 mg via INTRAVENOUS
  Filled 2016-07-26: qty 4

## 2016-07-26 MED ORDER — ONDANSETRON HCL 4 MG/2ML IJ SOLN: 4.0000 mg | Freq: Four times a day (QID) | INTRAMUSCULAR | Status: DC | PRN

## 2016-07-26 MED ORDER — ACETAMINOPHEN 650 MG RE SUPP: 650.0000 mg | Freq: Four times a day (QID) | RECTAL | Status: DC | PRN

## 2016-07-26 MED ORDER — METFORMIN HCL 500 MG PO TABS
500.0000 mg | ORAL_TABLET | Freq: Two times a day (BID) | ORAL | Status: DC
  Administered 2016-07-27 – 2016-07-28 (×2): 500 mg via ORAL
  Filled 2016-07-26 (×4): qty 1

## 2016-07-26 MED ORDER — SODIUM CHLORIDE 0.9% FLUSH
3.0000 mL | Freq: Two times a day (BID) | INTRAVENOUS | Status: DC
  Administered 2016-07-26 – 2016-07-28 (×5): 3 mL via INTRAVENOUS

## 2016-07-26 MED ORDER — GLIPIZIDE 10 MG PO TABS
10.0000 mg | ORAL_TABLET | Freq: Two times a day (BID) | ORAL | Status: DC
  Administered 2016-07-27 – 2016-07-28 (×2): 10 mg via ORAL
  Filled 2016-07-26 (×4): qty 1

## 2016-07-26 NOTE — Consult Note (Signed)
Cardiology Consult    Patient ID: Meghan [name] MRN: [id number], DOB/AGE: 1950-11-10   Admit date: 07/26/2016 Date of Consult: 07/26/2016  Primary Physician: Kathlyn Sacramento, MD Primary Cardiologist: Jerilynn Mages. Fletcher Anon, MD  Requesting Provider: Seth Bake, MD  Patient Profile    Meghan [name] is a 66 y.o. female with a history of stroke, cardiomyopathy, carotid arterial dzs, PAF, HTN, HL, DM, and obesity, who is being seen today for the evaluation of acute on chronic combined chf and rapid Afib at the request of Dr. Ether Griffins.  Past Medical History   Past Medical History:  Diagnosis Date  . Cardiomyopathy ([place])    a. 06/2013 Echo: EF 35-40%, glob HK, mildly dil LA, mild LVH, mild TR, mild to mod MR.  . Carotid arterial disease ([place])    a. 02/2012 s/p L CEA.  . Coronary artery disease    a. Presumed - 02/2012 Lexi MV: Anterior, anteroseptal, septal, apical infarct with mild peri-infarct ischemia, EF 41%- medically managed.  . 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 Hyperlipidemia   . Hypertension   . Morbid obesity ([place])   . Stroke [facility]) 2013    Past Surgical History:  Procedure Laterality Date  . CAROTID ENDARTERECTOMY     [facility]; Dr. Lucky Cowboy  . CHOLECYSTECTOMY    . LEG SURGERY     right;infection     Allergies  No Known Allergies  History of Present Illness    66 y/o ? with a h/o stroke in 2013, carotid arterial dzs s/p L CEA (2013), PAF in the setting cholecystitis in 06/2013, HTN, HL, DM, obesity, and presumed ICM (EF 35-40% in 2015).  At the time of stroke presentation in 2013, she was noted to have depressed EF.  Ischemic eval revealed anterior infarct with mild peri-infarct ischemia.  She was medically managed.  In 2015, she was admitted with cholecystitis and rapid afib.  She was placed on amiodarone and coumadin. She has done well over the years and has maintained sinus rhythm off of amiodarone.  She remains on coumadin.  She was in her usoh until ~ 2 wks ago,  when she began to note dyspnea and productive cough with "rattling" in her chest.  She also noted some orthopnea.  She does not weight regularly.  She took an over the counter bronchitis aid with some though not complete improvement in dyspnea.  She has not had any chest pain or significant edema.  No fevers/chills.  She presented to coumadin clinic and afterward noted worsening dyspnea.  She was assisted to the ER by our staff.  There, BNP was elevated  At 1062.  Trop was nl.  Stable, mild anemia.  CXR w bibasilar infiltrate vs atx.  ECG showed rapid afib.  She was treated with IV metoprolol and lasix and admitted to tele.  Breathing is much improved and she is minus 600 ml since arrival.  She remains in AF rvr in the 120's.  Inpatient Medications    . atorvastatin  10 mg Oral Daily  . docusate sodium  100 mg Oral BID  . furosemide  20 mg Intravenous BID  . glipiZIDE  10 mg Oral BID  . insulin aspart  0-15 Units Subcutaneous TID WC  . insulin aspart  0-5 Units Subcutaneous QHS  . insulin aspart  4 Units Subcutaneous TID WC  . lisinopril  10 mg Oral Daily  . metFORMIN  500 mg Oral BID  . metoprolol tartrate  25 mg Oral  Q6H  . sodium chloride flush  3 mL Intravenous Q12H  . Warfarin - Pharmacist Dosing Inpatient   Does not apply q1800    Family History    Family History  Problem Relation Age of Onset  . Heart disease Mother     Social History    Social History   Social History  . Marital status: Widowed    Spouse name: N/A  . Number of children: N/A  . Years of education: N/A   Occupational History  . Not on file.   Social History Main Topics  . Smoking status: Former Smoker    Packs/day: 0.50    Years: 5.00    Types: Cigarettes  . Smokeless tobacco: Not on file  . Alcohol use No  . Drug use: No  . Sexual activity: Not on file   Other Topics Concern  . Not on file   Social History Narrative  . No narrative on file     Review of Systems    General:  No chills,  fever, night sweats or weight changes.  Cardiovascular:  No chest pain, +++ dyspnea on exertion, +++ chronic pedal edema, +++ orthopnea, no palpitations, paroxysmal nocturnal dyspnea. Dermatological: No rash, lesions/masses Respiratory: +++ cough, +++ dyspnea Urologic: No hematuria, dysuria Abdominal:   No nausea, vomiting, diarrhea, bright red blood per rectum, melena, or hematemesis Neurologic:  No visual changes, wkns, changes in mental status. All other systems reviewed and are otherwise negative except as noted above.  Physical Exam    Blood pressure 115/78, pulse (!) 114, temperature 98 F (36.7 C), temperature source Oral, resp. rate 17, height 5\' 7"  (1.702 m), weight 230 lb (104.3 kg), SpO2 96 %.  General: Pleasant, NAD Psych: Normal affect. Neuro: Alert and oriented X 3. Moves all extremities spontaneously. HEENT: Normal  Neck: Supple without bruits.  Difficult to gauge jvp 2/2 girth. Lungs:  Resp regular and unlabored, bibasilar crackles. Heart: IR, IR, tachy, no s3, s4, or murmurs. Abdomen: Soft, obese, non-tender, non-distended, BS + x 4.  Extremities: No clubbing, cyanosis.  Trace bilat pedal edema. DP/PT/Radials 2+ and equal bilaterally.  Labs     Recent Labs  07/26/16 0836 07/26/16 1538  TROPONINI <0.03 <0.03   Lab Results  Component Value Date   WBC 6.6 07/26/2016   HGB 9.4 (L) 07/26/2016   HCT 30.1 (L) 07/26/2016   MCV 84.6 07/26/2016   PLT 357 07/26/2016     Recent Labs Lab 07/26/16 0836  NA 134*  K 4.8  CL 101  CO2 26  BUN 27*  CREATININE 1.22*  CALCIUM 8.9  PROT 8.0  BILITOT 1.5*  ALKPHOS 79  ALT 13*  AST 23  GLUCOSE 200*   Lab Results  Component Value Date   CHOL 165 02/14/2012   HDL 31 (L) 02/14/2012   LDLCALC 100 02/14/2012   TRIG 168 02/14/2012   Lab Results  Component Value Date   INR 2.85 07/26/2016   INR 3.7 07/26/2016   INR 3.3 06/21/2016      Radiology Studies    Dg Chest Port 1 View  Result Date:  07/26/2016 CLINICAL DATA:  Shortness of breath for last 2 weeks, history of stroke 2013 EXAM: PORTABLE CHEST 1 VIEW COMPARISON:  07/10/2013 FINDINGS: Cardiomegaly is noted. No pulmonary edema. There is small right pleural effusion. Trace left pleural effusion. Bilateral basilar atelectasis or infiltrate. Mild degenerative changes thoracic spine. IMPRESSION: No pulmonary edema. Cardiomegaly. Small right pleural effusion. Trace left pleural effusion. Bilateral basilar  atelectasis or infiltrate. Electronically Signed   By: Lahoma Crocker M.D.   On: 07/26/2016 08:57    ECG & Cardiac Imaging    afib, 130, prior anteroseptal infarct, no acute st/t changes.  Assessment & Plan    1.  Acute on chronic combined systolic/diastolic CHF; presumed ischemic cardiomyopathy:  Pt presents with a 2 wk h/o progressive dyspnea and orthopnea.  She does not weigh herself @ home.  She had worsening dyspnea in coumadin clinic today and was taken to the ED.  There, CXR shows basilar infiltrates and bnp is elevated. She is also in afib w rvr.  She does not feel palpitations, thus duration of afib is not known.  It is possible that loss of atrial kick has resulted in volume excess and progressive symptoms.  She has responded well to IV lasix thus far and is breathing better. She remains volume overloaded.  Cont lasix but increase to 40 IV bid.  F/u echo.  Address afib as outlined below.  2.  PAF w/ RVR:  Pt with first known episode of afib since 2015.  Likely driving CHF Ss.  Rates slightly better with diuresis and IV/oral  blocker.  Agree with lopressor 25 q 6h.  Prefer to avoid CCB in setting of known cardiomyopathy.  Cont coumadin (inr Rx today).  If she does not convert with oral  blocker, we will consider either the re-addition of amio, which she tolerated in the past, or simply DCCV (INR's therapeutic dating back to 06/21/2016).  Will keep NPO after midnight in case DCCV required, though would prefer to have her volume status  optimized first.  CHA2DS2VASc = 8.  3.  Essential HTN: stable.  4.  HL:  Last ldl we have is from 2013.  f/u lipids/lft's. Cont statin.  5. DM II:  Per IM.  6.  AKI:  Mild bump in creat.  Follow with diuresis.  May reflect poor forward flow/chf in the setting of afib.  Signed, Murray Hodgkins, NP 07/26/2016, 5:16 PM

## 2016-07-26 NOTE — ED Provider Notes (Signed)
[facility] Emergency Department Provider Note  ____________________________________________   I have reviewed the triage vital signs and the nursing notes.   HISTORY  Chief Complaint Shortness of Breath    HPI Meghan [name] is a 66 y.o. female  the history of CHF, prior echo showed EF seen in the 53s who presents today complaining of shortness of breath. Patient has had shortness of breath now for 2 weeks patient thought it was a cold but she is not actually coughing. No productive cough no fever, she just has exertional dyspnea and orthopnea. Also minimal leg swelling. Patient does have history of paroxysmal atrial fibrillation. She is on Coumadin as a result. She has not been routinely in atrial fibrillation. She does not have any symptoms of palpitations, or bleeding.He states she is compliant with her medications. She is not on oxygen.   Past Medical History:  Diagnosis Date  . Coronary artery disease   . Diabetes mellitus without complication ([place])    Type II  . Hyperlipidemia   . Hypertension   . Stroke [facility]) 2013    Patient Active Problem List   Diagnosis Date Noted  . Encounter for therapeutic drug monitoring 07/16/2013  . Atrial fibrillation ([place]) 07/16/2013  . Paroxysmal atrial fibrillation ([place]) 07/16/2013  . Ischemic cardiomyopathy 07/16/2013  . Essential hypertension 07/16/2013  . Chronic systolic heart failure ([place]) 04/12/2012  . Coronary artery disease   . Dyspnea 03/01/2012  . Preop cardiovascular exam 03/01/2012    Past Surgical History:  Procedure Laterality Date  . CAROTID ENDARTERECTOMY     [facility]; Dr. Lucky Cowboy  . CHOLECYSTECTOMY    . LEG SURGERY     right;infection    Prior to Admission medications   Medication Sig Start Date End Date Taking? Authorizing Provider  metFORMIN (GLUCOPHAGE) 1000 MG tablet Take 1,000 mg by mouth 2 (two) times daily. 06/18/16  Yes Historical Provider, MD  atorvastatin (LIPITOR) 10 MG tablet TAKE ONE  TABLET BY MOUTH ONCE DAILY 08/03/15   Wellington Hampshire, MD  carvedilol (COREG) 6.25 MG tablet TAKE ONE TABLET BY MOUTH TWICE DAILY 07/14/16   Wellington Hampshire, MD  furosemide (LASIX) 20 MG tablet TAKE ONE TABLET BY MOUTH ONCE DAILY 02/17/16   Wellington Hampshire, MD  glipiZIDE (GLUCOTROL) 10 MG tablet Take 10 mg by mouth 2 (two) times daily. 08/13/15   Historical Provider, MD  lisinopril (PRINIVIL,ZESTRIL) 10 MG tablet TAKE ONE TABLET BY MOUTH ONCE DAILY 12/28/15   Wellington Hampshire, MD  Omega-3 Fatty Acids (FISH OIL) 1200 MG CAPS Take by mouth daily.    Historical Provider, MD  warfarin (COUMADIN) 6 MG tablet TAKE AS DIRECTED  BY COUMADIN CLINIC 30 DAY SUPPLY 06/19/16   Wellington Hampshire, MD    Allergies Patient has no known allergies.  Family History  Problem Relation Age of Onset  . Heart disease Mother     Social History Social History  Substance Use Topics  . Smoking status: Former Smoker    Packs/day: 0.50    Years: 5.00    Types: Cigarettes  . Smokeless tobacco: Not on file  . Alcohol use No    Review of Systems Constitutional: No fever/chills Eyes: No visual changes. ENT: No sore throat. No stiff neck no neck pain Cardiovascular: Denies chest pain. Respiratory: Positive shortness of breath. Gastrointestinal:   no vomiting.  No diarrhea.  No constipation. Genitourinary: Negative for dysuria. Musculoskeletal: Positive mild lower extremity swelling Skin: Negative for rash. Recently she has a  small ulcer on her foot Neurological: Negative for severe headaches, focal weakness or numbness. 10-point ROS otherwise negative.  ____________________________________________   PHYSICAL EXAM:  VITAL SIGNS: ED Triage Vitals [07/26/16 0825]  Enc Vitals Group     BP 118/77     Pulse Rate (!) 138     Resp (!) 22     Temp      Temp src      SpO2 93 %     Weight 230 lb (104.3 kg)     Height 5\' 7"  (1.702 m)     Head Circumference      Peak Flow      Pain Score      Pain Loc       Pain Edu?      Excl. in [place]?     Constitutional: Alert and oriented. Well appearing and in no acute distress. Eyes: Conjunctivae are normal. PERRL. EOMI. Head: Atraumatic. Nose: No congestion/rhinnorhea. Mouth/Throat: Mucous membranes are moist.  Oropharynx non-erythematous. Neck: No stridor.   Nontender with no meningismus Cardiovascular: Normal rate, regular rhythm. Grossly normal heart sounds.  Good peripheral circulation. Respiratory: Normal respiratory effort.  No retractions. There are bibasilar rails appreciated Abdominal: Soft and nontender. No distention. No guarding no rebound Back:  There is no focal tenderness or step off.  there is no midline tenderness there are no lesions noted. there is no CVA tenderness Musculoskeletal: No lower extremity tenderness, no upper extremity tenderness. No joint effusions, no DVT signs strong distal pulses 1+ symmetric bilateral pitting edema Neurologic:  Normal speech and language. No gross focal neurologic deficits are appreciated.  Skin:  Skin is warm, dry and intact. Small ulcer noted to the right foot Psychiatric: Mood and affect are normal. Speech and behavior are normal.  ____________________________________________   LABS (all labs ordered are listed, but only abnormal results are displayed)  Labs Reviewed  COMPREHENSIVE METABOLIC PANEL - Abnormal; Notable for the following:       Result Value   Sodium 134 (*)    Glucose, Bld 200 (*)    BUN 27 (*)    Creatinine, Ser 1.22 (*)    ALT 13 (*)    Total Bilirubin 1.5 (*)    GFR calc non Af Amer 44 (*)    GFR calc Af Amer 52 (*)    All other components within normal limits  CBC WITH DIFFERENTIAL/PLATELET - Abnormal; Notable for the following:    RBC 3.56 (*)    Hemoglobin 9.4 (*)    HCT 30.1 (*)    MCHC 31.1 (*)    RDW 18.5 (*)    Basophils Absolute 0.2 (*)    All other components within normal limits  BRAIN NATRIURETIC PEPTIDE - Abnormal; Notable for the following:    B  Natriuretic Peptide 1,062.0 (*)    All other components within normal limits  PROTIME-INR - Abnormal; Notable for the following:    Prothrombin Time 30.5 (*)    All other components within normal limits  TROPONIN I   ____________________________________________  EKG  I personally interpreted any EKGs ordered by me or triage Atrial fibrillation rapid ventricular response rate 130 no acute ST elevation or depression normal axis nonspecific ST changes ____________________________________________  RADIOLOGY  I reviewed any imaging ordered by me or triage that were performed during my shift and, if possible, patient and/or family made aware of any abnormal findings. ____________________________________________   PROCEDURES  Procedure(s) performed: None  Procedures  Critical Care performed: CRITICAL CARE  Performed by: Schuyler Amor   Total critical care time: 45 minutes  Critical care time was exclusive of separately billable procedures and treating other patients.  Critical care was necessary to treat or prevent imminent or life-threatening deterioration.  Critical care was time spent personally by me on the following activities: development of treatment plan with patient and/or surrogate as well as nursing, discussions with consultants, evaluation of patient's response to treatment, examination of patient, obtaining history from patient or surrogate, ordering and performing treatments and interventions, ordering and review of laboratory studies, ordering and review of radiographic studies, pulse oximetry and re-evaluation of patient's condition.   ____________________________________________   INITIAL IMPRESSION / ASSESSMENT AND PLAN / ED COURSE  Pertinent labs & imaging results that were available during my care of the patient were reviewed by me and considered in my medical decision making (see chart for details).  Patient here with what likely is a recurrence of her  paroxysmal atrial fibrillation with concomitant CHF. Chest x-ray is reassuring. Patient is quite dyspneic even moving in the bed however. Low suspicion for PE. INR is certainly therapeutic. BNP is quite elevated. I do feel this is likely CHF. We will give her diuresis. Heart rate right now is 109 with atrial fibrillation, ranges between 110 and 1:30 that she is not having any chest pain. We'll give her diuresis initially see if we can improve her respiratory status. As needed, we will give her more rate controlling medications but if I drop her blood pressure I will have to give her fluids which I think will make her more symptomatically dyspneic with her CHF and given that she is tolerating atrial fibrillation with no symptoms, we will defer acute treatment but that may need to happen S timing is by depending on how she unfolds clinically.    ____________________________________________   FINAL CLINICAL IMPRESSION(S) / ED DIAGNOSES  Final diagnoses:  SOB (shortness of breath)      This chart was dictated using voice recognition software.  Despite best efforts to proofread,  errors can occur which can change meaning.      Schuyler Amor, MD 07/26/16 [phone]

## 2016-07-26 NOTE — Telephone Encounter (Signed)
Pt in office today for INR check. Reports increasing SOB w/exertion and at rest for 2 weeks. She thought she had bronchitis but did not see PCP and has been taking mucinex with no improvement in sx.  In the office she has difficulty walking without having to stop and catch her breath.  She has a hx of CHF, takes lasix but does not weigh herself or check BP at home. Her next appt is May 3 with Dr. Fletcher Anon. She is agreeable to proceed to the ED for further evaluation though she is concerned as she is to take care of her 68 year old and 59 year old grandchildren tonight.  Advised pt to be evaluated before trying to care for grandchildren. Notified her daughter, [name], who was in the parking lot.  [name] will proceed to ED. I accompanied pt to ED via wheelchair and waited with pt until she was checked in.

## 2016-07-26 NOTE — ED Notes (Signed)
Pt ambulatory to bathroom

## 2016-07-26 NOTE — ED Notes (Signed)
Patient denies pain and is resting comfortably.  

## 2016-07-26 NOTE — Progress Notes (Signed)
ANTICOAGULATION CONSULT NOTE - Initial Consult  Pharmacy Consult for warfarin  Indication: atrial fibrillation  No Known Allergies  Patient Measurements: Height: 5\' 7"  (170.2 cm) Weight: 230 lb (104.3 kg) IBW/kg (Calculated) : 61.6  Vital Signs: Temp: 98 F (36.7 C) (04/18 1528) Temp Source: Oral (04/18 1528) BP: 115/78 (04/18 1528) Pulse Rate: 114 (04/18 1529)  Labs:  Recent Labs  07/26/16 0755 07/26/16 0836  HGB  --  9.4*  HCT  --  30.1*  PLT  --  357  LABPROT  --  30.5*  INR 3.7 2.85  CREATININE  --  1.22*  TROPONINI  --  <0.03    Estimated Creatinine Clearance: 54.8 mL/min (A) (by C-G formula based on SCr of 1.22 mg/dL (H)).   Medical History: Past Medical History:  Diagnosis Date  . Coronary artery disease   . Diabetes mellitus without complication ([place])    Type II  . Hyperlipidemia   . Hypertension   . Stroke [facility]) 2013    Medications:  Scheduled:  . atorvastatin  10 mg Oral Daily  . docusate sodium  100 mg Oral BID  . furosemide  20 mg Intravenous BID  . glipiZIDE  10 mg Oral BID  . insulin aspart  0-15 Units Subcutaneous TID WC  . insulin aspart  0-5 Units Subcutaneous QHS  . insulin aspart  4 Units Subcutaneous TID WC  . lisinopril  10 mg Oral Daily  . metFORMIN  500 mg Oral BID  . metoprolol tartrate  25 mg Oral Q6H  . sodium chloride flush  3 mL Intravenous Q12H  . warfarin  9 mg Oral ONCE-1800  . Warfarin - Pharmacist Dosing Inpatient   Does not apply q1800    Assessment: Pharmacy consulted to dose warfarin in this 25 yoF admitted with afib with RVR (on warfarin PTA) and CHF exacerbation. Per ED physician patient does not have signs of bleeding. Per PTA medication list patient takes warfarin 6 mg on Monday, Thursday, Saturday and 9 mg on Tue, W, F, Sunday. Patient took last dose of warfarin 4/17 at 0800. No hx of mitral valve replacement. Will order PT/INR daily INR at 0755 was 3.7 and at 0836 was 2.85  Goal of Therapy:  INR  2-3 Monitor platelets by anticoagulation protocol: Yes   Plan:  Most recent INR therapeutic, continue current home regimen with warfarin 9 mg x 1 dose. Will check INR in am and dose accordingly  Darrow Bussing, PharmD Pharmacy Resident 07/26/2016 4:07 PM

## 2016-07-26 NOTE — Telephone Encounter (Signed)
Pt c/o Shortness Of Breath: STAT if SOB developed within the last 24 hours or pt is noticeably SOB on the phone  1. Are you currently SOB (can you hear that pt is SOB on the phone)? Yes in office for coumadin   2. How long have you been experiencing SOB? Couple of weeks   3. Are you SOB when sitting or when up moving around? Constant improves some at rest   4. Are you currently experiencing any other symptoms?  No

## 2016-07-26 NOTE — ED Notes (Signed)
Pt back in bed.

## 2016-07-26 NOTE — ED Triage Notes (Signed)
Pt reports sob that has been worsening over past 2 weeks . Pt denies cough, denies chest pain.

## 2016-07-26 NOTE — Progress Notes (Signed)
PAtient admitted to the floor from ED for SOB and doziness, patient alert and oriented, denie any pain family at bedside,cardiology consult

## 2016-07-26 NOTE — ED Notes (Signed)
Pt assisted to the bathroom w/o incident. 

## 2016-07-26 NOTE — H&P (Signed)
[place] [facility] [place] NAME: Meghan [name]    MR#:  [id number]  DATE OF BIRTH:  [birth year]  DATE OF ADMISSION:  07/26/2016  PRIMARY CARE PHYSICIAN: Kathlyn Sacramento, MD   REQUESTING/REFERRING PHYSICIAN:   CHIEF COMPLAINT:   Chief Complaint  Patient presents with  . Shortness of Breath    HISTORY OF PRESENT ILLNESS: Meghan [name]  is a 66 y.o. female with a known history of Stroke, paroxysmal atrial fibrillation, hypertension, hyperlipidemia, diabetes, coronary artery disease, cardiomyopathy with ejection fraction of 35-40% according to echocardiogram in 2015, who presents to the hospital with complaints of shortness of breath, lower extremity swelling, orthopnea, PND. According to the patient, she felt that she had bronchitis, she started having cough couple weeks ago. There was no significant phlegm production, however, she felt wheezy, she had no fevers or chills, she also had a significant shortness of breath which worsened over the period of time, now she has two-pillow orthopnea, PND, some lower extremity swelling and just cannot do much because of the weakness and dyspnea on exertion, she decided to come to emergency room for further evaluation and treatment, when she was noted to have oxygenation in low 90s, however, her heart rate was found to be 140, in atrial fibrillation, hospitalist services were contacted for admission. Patient has no chest pain.   PAST MEDICAL HISTORY:   Past Medical History:  Diagnosis Date  . Coronary artery disease   . Diabetes mellitus without complication ([place])    Type II  . Hyperlipidemia   . Hypertension   . Stroke [facility]) 2013    PAST SURGICAL HISTORY: Past Surgical History:  Procedure Laterality Date  . CAROTID ENDARTERECTOMY     [facility]; Dr. Lucky Cowboy  . CHOLECYSTECTOMY    . LEG SURGERY     right;infection    SOCIAL HISTORY:  Social History  Substance Use Topics  . Smoking status: Former Smoker   Packs/day: 0.50    Years: 5.00    Types: Cigarettes  . Smokeless tobacco: Not on file  . Alcohol use No    FAMILY HISTORY:  Family History  Problem Relation Age of Onset  . Heart disease Mother     DRUG ALLERGIES: No Known Allergies  Review of Systems  Constitutional: Positive for malaise/fatigue. Negative for chills, fever and weight loss.  HENT: Negative for congestion.   Eyes: Negative for blurred vision and double vision.  Respiratory: Positive for cough, shortness of breath and wheezing. Negative for sputum production.   Cardiovascular: Positive for orthopnea, leg swelling and PND. Negative for chest pain and palpitations.  Gastrointestinal: Negative for abdominal pain, blood in stool, constipation, diarrhea, nausea and vomiting.  Genitourinary: Negative for dysuria, frequency, hematuria and urgency.  Musculoskeletal: Negative for falls.  Neurological: Negative for dizziness, tremors, focal weakness and headaches.  Endo/Heme/Allergies: Does not bruise/bleed easily.  Psychiatric/Behavioral: Negative for depression. The patient does not have insomnia.     MEDICATIONS AT HOME:  Prior to Admission medications   Medication Sig Start Date End Date Taking? Authorizing Provider  atorvastatin (LIPITOR) 10 MG tablet TAKE ONE TABLET BY MOUTH ONCE DAILY 08/03/15  Yes Wellington Hampshire, MD  carvedilol (COREG) 6.25 MG tablet TAKE ONE TABLET BY MOUTH TWICE DAILY 07/14/16  Yes Wellington Hampshire, MD  furosemide (LASIX) 20 MG tablet TAKE ONE TABLET BY MOUTH ONCE DAILY 02/17/16  Yes Wellington Hampshire, MD  glipiZIDE (GLUCOTROL) 10 MG tablet Take 10 mg by mouth 2 (  two) times daily. 08/13/15  Yes Historical Provider, MD  lisinopril (PRINIVIL,ZESTRIL) 10 MG tablet TAKE ONE TABLET BY MOUTH ONCE DAILY 12/28/15  Yes Wellington Hampshire, MD  metFORMIN (GLUCOPHAGE) 500 MG tablet Take 500 mg by mouth 2 (two) times daily.  06/18/16  Yes Historical Provider, MD  warfarin (COUMADIN) 6 MG tablet TAKE AS DIRECTED  BY  COUMADIN CLINIC 30 DAY SUPPLY Patient taking differently: Take 6 mg on Monday, Thursday and Saturday. Take 9 mg on Tuesday, Wednesday, Friday and Sunday. 06/19/16  Yes Wellington Hampshire, MD      PHYSICAL EXAMINATION:   VITAL SIGNS: Blood pressure (!) 122/94, pulse (!) 113, temperature 97.5 F (36.4 C), temperature source Oral, resp. rate (!) 24, height 5\' 7"  (1.702 m), weight 104.3 kg (230 lb), SpO2 98 %.  GENERAL:  66 y.o.-year-old patient Sitting on the stretcher in mild respiratory distress ,  tachypneic, uncomfortable, especially with exertion or speech EYES: Pupils equal, round, reactive to light and accommodation. No scleral icterus. Extraocular muscles intact.  HEENT: Head atraumatic, normocephalic. Oropharynx and nasopharynx clear.  NECK:  Supple, no jugular venous distention. No thyroid enlargement, no tenderness.  LUNGS: Some diminished breath sounds at bases bilaterally, no wheezing, but bilateral basilar rales,rhonchi and crepitations noted on auscultation. Intermittent use of accessory muscles of respiration, with speech and exertion.  CARDIOVASCULAR: S1, S2 , tachycardic, irregularly irregular. No murmurs, rubs, or gallops.  ABDOMEN: Soft, nontender, nondistended. Bowel sounds present. No organomegaly or mass.  EXTREMITIES: Trace lower extremity and pedal edema, no cyanosis, or clubbing.  NEUROLOGIC: Cranial nerves II through XII are intact. Muscle strength 5/5 in all extremities. Sensation intact. Gait not checked.  PSYCHIATRIC: The patient is alert and oriented x 3.  SKIN: No obvious rash, lesion, or ulcer.   LABORATORY PANEL:   CBC  Recent Labs Lab 07/26/16 0836  WBC 6.6  HGB 9.4*  HCT 30.1*  PLT 357  MCV 84.6  MCH 26.3  MCHC 31.1*  RDW 18.5*  LYMPHSABS 1.0  MONOABS 0.6  EOSABS 0.2  BASOSABS 0.2*   ------------------------------------------------------------------------------------------------------------------  Chemistries   Recent Labs Lab  07/26/16 0836  NA 134*  K 4.8  CL 101  CO2 26  GLUCOSE 200*  BUN 27*  CREATININE 1.22*  CALCIUM 8.9  AST 23  ALT 13*  ALKPHOS 79  BILITOT 1.5*   ------------------------------------------------------------------------------------------------------------------  Cardiac Enzymes  Recent Labs Lab 07/26/16 0836  TROPONINI <0.03   ------------------------------------------------------------------------------------------------------------------  RADIOLOGY: Dg Chest Port 1 View  Result Date: 07/26/2016 CLINICAL DATA:  Shortness of breath for last 2 weeks, history of stroke 2013 EXAM: PORTABLE CHEST 1 VIEW COMPARISON:  07/10/2013 FINDINGS: Cardiomegaly is noted. No pulmonary edema. There is small right pleural effusion. Trace left pleural effusion. Bilateral basilar atelectasis or infiltrate. Mild degenerative changes thoracic spine. IMPRESSION: No pulmonary edema. Cardiomegaly. Small right pleural effusion. Trace left pleural effusion. Bilateral basilar atelectasis or infiltrate. Electronically Signed   By: Lahoma Crocker M.D.   On: 07/26/2016 08:57    EKG: Orders placed or performed during the hospital encounter of 07/26/16  . ED EKG  . ED EKG  . EKG 12-Lead  . EKG 12-Lead   EKG in the emergency room revealed atrial fibrillation at 130 beats per minutes, low voltage QRS in precordial leads, anteroseptal infarct, old, borderline repolarization abnormality   IMPRESSION AND PLAN:  Active Problems:   Atrial fibrillation with RVR (HCC)   Acute on chronic systolic CHF (congestive heart failure) (HCC)   CKD (chronic kidney  disease), stage III   Hyponatremia   Anemia  #1. A. fib, RVR, admit the patient to the medical floor, initiate her on metoprolol, advance dose as needed, hold Coreg for now, get cardiologist involved, get echo, continue Coumadin, therapeutic INR, check TSH #2. Acute on chronic systolic CHF, initiate a diuretic, continue ACE inhibitor, follow ins and outs, weight,  get echocardiogram, cardiology consultation, continue oxygen therapy as needed #3. CAD stage III, followed with diuresis #4. Hyponatremia, follow with diuresis #5. Anemia, follow with diuresis, get Hemoccult #6. Diabetes mellitus, continue outpatient medications, sliding scale insulin while in the hospital, getting hemoglobin A1c  All the records are reviewed and case discussed with ED provider. Management plans discussed with the patient, family and they are in agreement.  CODE STATUS: Code Status History    This patient does not have a recorded code status. Please follow your organizational policy for patients in this situation.       TOTAL TIME TAKING CARE OF THIS PATIENT: 60 minutes.    Theodoro Grist M.D on 07/26/2016 at 10:42 AM  Between 7am to 6pm - Pager - [phone] After 6pm go to [email or web address] - password [email or web address]  Office  [phone]  CC: Primary care physician; Kathlyn Sacramento, MD

## 2016-07-27 ENCOUNTER — Encounter: Payer: Self-pay | Admitting: *Deleted

## 2016-07-27 DIAGNOSIS — I5023 Acute on chronic systolic (congestive) heart failure: Secondary | ICD-10-CM

## 2016-07-27 LAB — CBC
HEMATOCRIT: 28.7 % — AB (ref 35.0–47.0)
HEMOGLOBIN: 9 g/dL — AB (ref 12.0–16.0)
MCH: 26.6 pg (ref 26.0–34.0)
MCHC: 31.4 g/dL — AB (ref 32.0–36.0)
MCV: 84.6 fL (ref 80.0–100.0)
Platelets: 316 10*3/uL (ref 150–440)
RBC: 3.4 MIL/uL — AB (ref 3.80–5.20)
RDW: 18.4 % — ABNORMAL HIGH (ref 11.5–14.5)
WBC: 6.2 10*3/uL (ref 3.6–11.0)

## 2016-07-27 LAB — HEMOGLOBIN A1C
HEMOGLOBIN A1C: 8.1 % — AB (ref 4.8–5.6)
MEAN PLASMA GLUCOSE: 186 mg/dL

## 2016-07-27 LAB — GLUCOSE, CAPILLARY
GLUCOSE-CAPILLARY: 171 mg/dL — AB (ref 65–99)
GLUCOSE-CAPILLARY: 127 mg/dL — AB (ref 65–99)
Glucose-Capillary: 137 mg/dL — ABNORMAL HIGH (ref 65–99)
Glucose-Capillary: 134 mg/dL — ABNORMAL HIGH (ref 65–99)

## 2016-07-27 LAB — ECHOCARDIOGRAM COMPLETE
Height: 67 in
Weight: 3680 oz

## 2016-07-27 LAB — BASIC METABOLIC PANEL
ANION GAP: 8 (ref 5–15)
BUN: 29 mg/dL — ABNORMAL HIGH (ref 6–20)
CALCIUM: 8.8 mg/dL — AB (ref 8.9–10.3)
CO2: 27 mmol/L (ref 22–32)
Chloride: 101 mmol/L (ref 101–111)
Creatinine, Ser: 1.09 mg/dL — ABNORMAL HIGH (ref 0.44–1.00)
GFR, EST AFRICAN AMERICAN: 59 mL/min — AB (ref >60)
GFR, EST NON AFRICAN AMERICAN: 51 mL/min — AB (ref >60)
GLUCOSE: 140 mg/dL — AB (ref 65–99)
Potassium: 3.8 mmol/L (ref 3.5–5.1)
SODIUM: 136 mmol/L (ref 135–145)

## 2016-07-27 LAB — TROPONIN I: Troponin I: <0.03 ng/mL (ref <0.03)

## 2016-07-27 LAB — PROTIME-INR
INR: 3.11
PROTHROMBIN TIME: 32.7 s — AB (ref 11.4–15.2)

## 2016-07-27 MED ORDER — HYDROCERIN EX CREA
TOPICAL_CREAM | Freq: Two times a day (BID) | CUTANEOUS | Status: DC
  Administered 2016-07-27: 10:00:00 via TOPICAL
  Filled 2016-07-27: qty 113

## 2016-07-27 MED ORDER — AMIODARONE HCL 200 MG PO TABS
400.0000 mg | ORAL_TABLET | Freq: Three times a day (TID) | ORAL | Status: DC
  Administered 2016-07-27 (×3): 400 mg via ORAL
  Filled 2016-07-27 (×3): qty 2

## 2016-07-27 MED ORDER — SODIUM CHLORIDE 0.9% FLUSH
3.0000 mL | Freq: Two times a day (BID) | INTRAVENOUS | Status: DC
  Administered 2016-07-27 – 2016-07-28 (×3): 3 mL via INTRAVENOUS

## 2016-07-27 MED ORDER — WARFARIN SODIUM 6 MG PO TABS
6.0000 mg | ORAL_TABLET | Freq: Once | ORAL | Status: AC
  Administered 2016-07-27: 6 mg via ORAL
  Filled 2016-07-27: qty 1

## 2016-07-27 MED ORDER — SODIUM CHLORIDE 0.9 % IV SOLN
250.0000 mL | INTRAVENOUS | Status: DC
  Administered 2016-07-28: 08:00:00 via INTRAVENOUS

## 2016-07-27 MED ORDER — SODIUM CHLORIDE 0.9% FLUSH: 3.0000 mL | INTRAVENOUS | Status: DC | PRN

## 2016-07-27 NOTE — Consult Note (Signed)
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  Erythematous, Patchy, patient sees no physician for this and has "creams" at home.  The bilateral neuropathic ulcerations are measured, right foot, 1st metatarsal head measures 0.4cm x 0.4cm x 0.1cm and presents with keratotic tissue surrounding. The left foot presents with an affected great toe wound measuring 2.5cm x 2cm x 0.1cm with a pink wound bed and no exudate.  There is a 1st metatarsal head injury similar to the one on the right foot measuring 0.4cm round x 0.1 with hypertrophic callous present.  There is poor hygiene of the feet and the nails are thickened, long and curling. Wound bed:As described above Drainage (amount, consistency, odor) As described abvoe Periwound: Dry, intact Dressing procedure/placement/frequency: Patient wound benefit from referral to a podiatrist for continued follow up of her feet and nails. If you agree, please order/refer.  She states she has a doctor for this but that she has not seen them in a long time as "they are difficult to get an appointment with." She is not receptive to teaching today about wearing of protective shoe wear at all times, daily foot inspections or hygiene to the feet. I  reinforce previous teaching regarding the importance of this with diabetes. She is at first reluctant to accept Eucerin Cream to the psoriatic areas, but does eventually. She takes no steroids and has no prescriptive emollients for her back, buttocks or flank areas. [place] nursing team will not follow, but will remain available to this patient, the nursing and medical teams.  Please re-consult if  needed. Thanks, Maudie Flakes, MSN, RN, [place], Arther Abbott  Pager# [phone]

## 2016-07-27 NOTE — Progress Notes (Signed)
Patient Name: Meghan [name]      SUBJECTIVE: Breathing better. Unaware of tachypalpitations.  Presented with atrial fibrillation rapid rates congestive heart failure and more severely depressed left ventricular function  EF 25%---35-40% (3/15) Myoview scan 12/13 anterior wall motion abnormalities ejection fraction 41%  Past Medical History:  Diagnosis Date  . Cardiomyopathy ([place])    a. 06/2013 Echo: EF 35-40%, glob HK, mildly dil LA, mild LVH, mild TR, mild to mod MR.  . Carotid arterial disease ([place])    a. 02/2012 s/p L CEA.  . Coronary artery disease    a. Presumed - 02/2012 Lexi MV: Anterior, anteroseptal, septal, apical infarct with mild peri-infarct ischemia, EF 41%- medically managed.  . 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 Hyperlipidemia   . Hypertension   . Morbid obesity ([place])   . Stroke [facility]) 2013    Scheduled Meds:  Scheduled Meds: . amiodarone  400 mg Oral TID  . atorvastatin  10 mg Oral Daily  . docusate sodium  100 mg Oral BID  . furosemide  40 mg Intravenous BID  . glipiZIDE  10 mg Oral BID  . hydrocerin   Topical BID  . insulin aspart  0-15 Units Subcutaneous TID WC  . insulin aspart  0-5 Units Subcutaneous QHS  . insulin aspart  4 Units Subcutaneous TID WC  . lisinopril  10 mg Oral Daily  . metFORMIN  500 mg Oral BID  . metoprolol tartrate  25 mg Oral Q6H  . sodium chloride flush  3 mL Intravenous Q12H  . sodium chloride flush  3 mL Intravenous Q12H  . Warfarin - Pharmacist Dosing Inpatient   Does not apply q1800   Continuous Infusions: . sodium chloride     acetaminophen **OR** acetaminophen, ondansetron **OR** ondansetron (ZOFRAN) IV, sodium chloride flush    PHYSICAL EXAM Vitals:   07/26/16 2233 07/27/16 0441 07/27/16 0735 07/27/16 0736  BP: 111/78 115/80  100/74  Pulse: 86 (!) 119 98 (!) 107  Resp:  18 18   Temp:  97.5 F (36.4 C) 97.4 F (36.3 C)   TempSrc:  Oral Oral   SpO2: 91% 90% 92% (!) 89%    Weight:  242 lb 11.2 oz (110.1 kg)    Height:       Well developed and nourished in no acute distress HENT normal Neck supple with JVP 8 Carotids brisk and full without bruits Clear Irregularly irregular rate and rhythm with rapid ventricular response, no murmurs or gallops Abd-soft with active BS without hepatomegaly No Clubbing cyanosis tr edema Skin-warm and dry A & Oriented  Grossly normal sensory and motor function   TELEMETRY: Reviewed personnally pt in atrial fibrillation with a rapid rate:  ECG personally reviewed atrial fibrillation with a rapid rate and low voltage.   Intake/Output Summary (Last 24 hours) at 07/27/16 1012 Last data filed at 07/27/16 1000  Gross per 24 hour  Intake              243 ml  Output              951 ml  Net             -708 ml    LABS: Basic Metabolic Panel:  Recent Labs Lab 07/26/16 0836 07/27/16 0322  NA 134* 136  K 4.8 3.8  CL 101 101  CO2 26 27  GLUCOSE 200* 140*  BUN 27* 29*  CREATININE 1.22* 1.09*  CALCIUM 8.9 8.8*   Cardiac Enzymes:  Recent Labs  07/26/16 1538 07/26/16 2110 07/27/16 0322  TROPONINI <0.03 <0.03 <0.03   CBC:  Recent Labs Lab 07/26/16 0836 07/27/16 0322  WBC 6.6 6.2  NEUTROABS 4.6  --   HGB 9.4* 9.0*  HCT 30.1* 28.7*  MCV 84.6 84.6  PLT 357 316   PROTIME:  Recent Labs  07/26/16 0755 07/26/16 0836 07/27/16 0322  LABPROT  --  30.5* 32.7*  INR 3.7 2.85 3.11   Liver Function Tests:  Recent Labs  07/26/16 0836  AST 23  ALT 13*  ALKPHOS 79  BILITOT 1.5*  PROT 8.0  ALBUMIN 3.5   No results for input(s): LIPASE, AMYLASE in the last 72 hours. BNP: BNP (last 3 results)  Recent Labs  07/26/16 0836  BNP 1,062.0*    ProBNP (last 3 results) No results for input(s): PROBNP in the last 8760 hours.  D-Dimer: No results for input(s): DDIMER in the last 72 hours. Hemoglobin A1C:  Recent Labs  07/26/16 1538  HGBA1C 8.1*   Fasting Lipid Panel: No results for input(s):  CHOL, HDL, LDLCALC, TRIG, CHOLHDL, LDLDIRECT in the last 72 hours. Thyroid Function Tests:  Recent Labs  07/26/16 1538  TSH 5.260*     ASSESSMENT AND PLAN:  Active Problems:   Atrial fibrillation with RVR (HCC)   Acute on chronic systolic CHF (congestive heart failure) (HCC)   CKD (chronic kidney disease), stage III   Hyponatremia   Anemia  The patient has acute on chronic systolic heart failure with atrial fibrillation and a rapid rate. I discussed with Dr. Audelia Acton and we agreed to proceed with cardioversion. I would hope that she would hold sinus rhythm sufficiently long to allow the amiodarone to buildup in her system. I would maintain her on amiodarone going forward. I suggest the use of a pulse oximeter-like device to help her know whether she is out of rhythm.  Continue low-dose diuretics for her heart failure.  Do we know the cause of her anemia?   Signed, Virl Axe MD  07/27/2016

## 2016-07-27 NOTE — Progress Notes (Signed)
[place] [facility] [place] NAME: Meghan [name]    MR#:  [id number]  DATE OF BIRTH:  01/10/1951  SUBJECTIVE:    REVIEW OF SYSTEMS:   ROS Tolerating Diet: Tolerating PT:   DRUG ALLERGIES:  No Known Allergies  VITALS:  Blood pressure (!) 97/57, pulse (!) 41, temperature 97.4 F (36.3 C), temperature source Oral, resp. rate 10, height 5\' 7"  (1.702 m), weight 110.1 kg (242 lb 11.2 oz), SpO2 93 %.  PHYSICAL EXAMINATION:   Physical Exam  GENERAL:  66 y.o.-year-old patient lying in the bed with no acute distress.  EYES: Pupils equal, round, reactive to light and accommodation. No scleral icterus. Extraocular muscles intact.  HEENT: Head atraumatic, normocephalic. Oropharynx and nasopharynx clear.  NECK:  Supple, no jugular venous distention. No thyroid enlargement, no tenderness.  LUNGS: Normal breath sounds bilaterally, no wheezing, rales, rhonchi. No use of accessory muscles of respiration.  CARDIOVASCULAR: S1, S2 normal. No murmurs, rubs, or gallops.  ABDOMEN: Soft, nontender, nondistended. Bowel sounds present. No organomegaly or mass.  EXTREMITIES: No cyanosis, clubbing or edema b/l.    NEUROLOGIC: Cranial nerves II through XII are intact. No focal Motor or sensory deficits b/l.   PSYCHIATRIC:  patient is alert and oriented x 3.  SKIN: No obvious rash, lesion, or ulcer.   LABORATORY PANEL:  CBC  Recent Labs Lab 07/27/16 0322  WBC 6.2  HGB 9.0*  HCT 28.7*  PLT 316    Chemistries   Recent Labs Lab 07/26/16 0836 07/27/16 0322  NA 134* 136  K 4.8 3.8  CL 101 101  CO2 26 27  GLUCOSE 200* 140*  BUN 27* 29*  CREATININE 1.22* 1.09*  CALCIUM 8.9 8.8*  AST 23  --   ALT 13*  --   ALKPHOS 79  --   BILITOT 1.5*  --    Cardiac Enzymes  Recent Labs Lab 07/27/16 0322  TROPONINI <0.03   RADIOLOGY:  Dg Chest Port 1 View  Result Date: 07/26/2016 CLINICAL DATA:  Shortness of breath for last 2 weeks, history of stroke  2013 EXAM: PORTABLE CHEST 1 VIEW COMPARISON:  07/10/2013 FINDINGS: Cardiomegaly is noted. No pulmonary edema. There is small right pleural effusion. Trace left pleural effusion. Bilateral basilar atelectasis or infiltrate. Mild degenerative changes thoracic spine. IMPRESSION: No pulmonary edema. Cardiomegaly. Small right pleural effusion. Trace left pleural effusion. Bilateral basilar atelectasis or infiltrate. Electronically Signed   By: Lahoma Crocker M.D.   On: 07/26/2016 08:57   ASSESSMENT AND PLAN:   Meghan [name]  is a 66 y.o. female with a known history of Stroke, paroxysmal atrial fibrillation, hypertension, hyperlipidemia, diabetes, coronary artery disease, cardiomyopathy with ejection fraction of 35-40% according to echocardiogram in 2015, who presents to the hospital with complaints of shortness of breath, lower extremity swelling, orthopnea, PND.   #1. A. fib, RVR - on metoprolol, and amiodarone 400 mg tid - continue Coumadin, therapeutic INR -seen by Drs Fletcher Anon and Caryl Comes and plans for Cardioversion tomorrow  #2. Acute on chronic systolic CHF, initiate a diuretic, continue ACE inhibitor, follow ins and outs, daily weight -EF 25-30% -IV lasix bid -lisinopril  #3. CKD stage III, followed with diuresis 1.22--1.09  #4. Hyponatremia, follow with diuresis -134--136  #5. Anemia, follow with diuresis, get Hemoccult No GI w/u found in the chart Defer to PCP   #6. Diabetes mellitus, continue outpatient medications, sliding scale insulin resume metformin and glipizide Case discussed with Care Management/Social Worker. Management plans discussed  with the patient, family and they are in agreement.  CODE STATUS: full  DVT Prophylaxis: coumadin  TOTAL TIME TAKING CARE OF THIS PATIENT: *25* minutes.  >50% time spent on counselling and coordination of care  POSSIBLE D/C IN 1-2 DAYS, DEPENDING ON CLINICAL CONDITION.  Note: This dictation was prepared with Dragon dictation along with  smaller phrase technology. Any transcriptional errors that result from this process are unintentional.  Rylynn Schoneman M.D on 07/27/2016 at 3:09 PM  Between 7am to 6pm - Pager - [phone]  After 6pm go to [email or web address] - password [email or web address] [place] [facility]  Office  [phone]  CC: Primary care physician; Kathlyn Sacramento, MD

## 2016-07-27 NOTE — Progress Notes (Signed)
Patient remains alert and oriented, denies any pain, vss, mood calm,  Patient educated about atrial fibrillation and cardioversion scheduled for tomorrow , consent signed in the chart.

## 2016-07-27 NOTE — Progress Notes (Signed)
ANTICOAGULATION CONSULT NOTE - Initial Consult  Pharmacy Consult for warfarin  Indication: atrial fibrillation  No Known Allergies  Patient Measurements: Height: 5\' 7"  (170.2 cm) Weight: 242 lb 11.2 oz (110.1 kg) IBW/kg (Calculated) : 61.6  Vital Signs: Temp: 97.4 F (36.3 C) (04/19 0735) Temp Source: Oral (04/19 0735) BP: 100/74 (04/19 0736) Pulse Rate: 107 (04/19 0736)  Labs:  Recent Labs  07/26/16 0755  07/26/16 0836 07/26/16 1538 07/26/16 2110 07/27/16 0322  HGB  --   --  9.4*  --   --  9.0*  HCT  --   --  30.1*  --   --  28.7*  PLT  --   --  357  --   --  316  LABPROT  --   --  30.5*  --   --  32.7*  INR 3.7  --  2.85  --   --  3.11  CREATININE  --   --  1.22*  --   --  1.09*  TROPONINI  --   < > <0.03 <0.03 <0.03 <0.03  < > = values in this interval not displayed.  Estimated Creatinine Clearance: 63.2 mL/min (A) (by C-G formula based on SCr of 1.09 mg/dL (H)).   Medical History: Past Medical History:  Diagnosis Date  . Cardiomyopathy ([place])    a. 06/2013 Echo: EF 35-40%, glob HK, mildly dil LA, mild LVH, mild TR, mild to mod MR.  . Carotid arterial disease ([place])    a. 02/2012 s/p L CEA.  . Coronary artery disease    a. Presumed - 02/2012 Lexi MV: Anterior, anteroseptal, septal, apical infarct with mild peri-infarct ischemia, EF 41%- medically managed.  . 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 Hyperlipidemia   . Hypertension   . Morbid obesity ([place])   . Stroke [facility]) 2013    Medications:  Scheduled:  . amiodarone  400 mg Oral TID  . atorvastatin  10 mg Oral Daily  . docusate sodium  100 mg Oral BID  . furosemide  40 mg Intravenous BID  . glipiZIDE  10 mg Oral BID  . hydrocerin   Topical BID  . insulin aspart  0-15 Units Subcutaneous TID WC  . insulin aspart  0-5 Units Subcutaneous QHS  . insulin aspart  4 Units Subcutaneous TID WC  . lisinopril  10 mg Oral Daily  . metFORMIN  500 mg Oral BID  . metoprolol tartrate  25 mg Oral  Q6H  . sodium chloride flush  3 mL Intravenous Q12H  . sodium chloride flush  3 mL Intravenous Q12H  . warfarin  6 mg Oral ONCE-1800  . Warfarin - Pharmacist Dosing Inpatient   Does not apply q1800    Assessment: Pharmacy consulted to dose warfarin in this 42 yoF admitted with afib with RVR (on warfarin PTA) and CHF exacerbation. Per ED physician patient does not have signs of bleeding. Per PTA medication list patient takes warfarin 6 mg on Monday, Thursday, Saturday and 9 mg on Tue, W, F, Sunday. Patient took last dose of warfarin 4/17 at 0800. No hx of mitral valve replacement. Will order PT/INR daily INR at 0755 was 3.7 and at 0836 was 2.85   4/18: 2.85; warfarin 9 mg  4/19: 3.1; warfarin 6 mg   Goal of Therapy:  INR 2-3 Monitor platelets by anticoagulation protocol: Yes   Plan:  Will give warfarin 6 mg PO x 1 dose. Will recheck INR with am labs.  07/27/2016 2:04 PM

## 2016-07-28 ENCOUNTER — Encounter: Payer: Self-pay | Admitting: *Deleted

## 2016-07-28 ENCOUNTER — Inpatient Hospital Stay: Payer: Medicare Other | Admitting: Anesthesiology

## 2016-07-28 ENCOUNTER — Telehealth: Payer: Self-pay | Admitting: Cardiovascular Disease

## 2016-07-28 ENCOUNTER — Encounter
Admission: EM | Disposition: A | Discharge status: Home / Self Care | Payer: Self-pay | Source: Home / Self Care | Attending: Internal Medicine

## 2016-07-28 HISTORY — PX: CARDIOVERSION: EP1203

## 2016-07-28 LAB — GLUCOSE, CAPILLARY
Glucose-Capillary: 120 mg/dL — ABNORMAL HIGH (ref 65–99)
Glucose-Capillary: 138 mg/dL — ABNORMAL HIGH (ref 65–99)

## 2016-07-28 LAB — PROTIME-INR
INR: 3.55
PROTHROMBIN TIME: 36.4 s — AB (ref 11.4–15.2)

## 2016-07-28 SURGERY — CARDIOVERSION (CATH LAB)
Anesthesia: Moderate Sedation

## 2016-07-28 SURGERY — CARDIOVERSION (CATH LAB)
Anesthesia: General

## 2016-07-28 MED ORDER — PROPOFOL 10 MG/ML IV BOLUS
INTRAVENOUS | Status: DC | PRN
  Administered 2016-07-28: 40 mg via INTRAVENOUS

## 2016-07-28 MED ORDER — FUROSEMIDE 40 MG PO TABS
40.0000 mg | ORAL_TABLET | Freq: Every day | ORAL | Status: DC
  Administered 2016-07-28: 40 mg via ORAL
  Filled 2016-07-28: qty 1

## 2016-07-28 MED ORDER — PROPOFOL 10 MG/ML IV BOLUS
INTRAVENOUS | Status: AC
  Filled 2016-07-28: qty 20

## 2016-07-28 MED ORDER — LIDOCAINE HCL (PF) 2 % IJ SOLN
INTRAMUSCULAR | Status: AC
  Filled 2016-07-28: qty 2

## 2016-07-28 MED ORDER — LIDOCAINE HCL (CARDIAC) 20 MG/ML IV SOLN
INTRAVENOUS | Status: DC | PRN
  Administered 2016-07-28: 20 mg via INTRAVENOUS

## 2016-07-28 MED ORDER — METOPROLOL SUCCINATE ER 50 MG PO TB24: 50.0000 mg | ORAL_TABLET | Freq: Every day | ORAL | 1 refills | Status: DC

## 2016-07-28 MED ORDER — FENTANYL CITRATE (PF) 100 MCG/2ML IJ SOLN
INTRAMUSCULAR | Status: AC
  Filled 2016-07-28: qty 2

## 2016-07-28 MED ORDER — AMIODARONE HCL 200 MG PO TABS
200.0000 mg | ORAL_TABLET | Freq: Two times a day (BID) | ORAL | Status: DC
  Administered 2016-07-28: 200 mg via ORAL
  Filled 2016-07-28: qty 1

## 2016-07-28 MED ORDER — WARFARIN SODIUM 5 MG PO TABS: 5.0000 mg | ORAL_TABLET | Freq: Once | ORAL | Status: DC

## 2016-07-28 MED ORDER — AMIODARONE HCL 200 MG PO TABS: 200.0000 mg | ORAL_TABLET | Freq: Two times a day (BID) | ORAL | 1 refills | Status: DC

## 2016-07-28 MED ORDER — PHENYLEPHRINE HCL 10 MG/ML IJ SOLN
INTRAMUSCULAR | Status: DC | PRN
  Administered 2016-07-28: 50 ug via INTRAVENOUS

## 2016-07-28 MED ORDER — FENTANYL CITRATE (PF) 100 MCG/2ML IJ SOLN: 25.0000 ug | INTRAMUSCULAR | Status: DC | PRN

## 2016-07-28 MED ORDER — WARFARIN SODIUM 5 MG PO TABS: 5.0000 mg | ORAL_TABLET | Freq: Once | ORAL | 0 refills | Status: DC

## 2016-07-28 MED ORDER — MIDAZOLAM HCL 2 MG/2ML IJ SOLN
INTRAMUSCULAR | Status: AC
  Filled 2016-07-28: qty 2

## 2016-07-28 MED ORDER — ONDANSETRON HCL 4 MG/2ML IJ SOLN: 4.0000 mg | Freq: Once | INTRAMUSCULAR | Status: DC | PRN

## 2016-07-28 MED ORDER — METOPROLOL SUCCINATE ER 50 MG PO TB24
50.0000 mg | ORAL_TABLET | Freq: Every day | ORAL | Status: DC
  Administered 2016-07-28: 50 mg via ORAL
  Filled 2016-07-28: qty 1

## 2016-07-28 NOTE — Discharge Instructions (Addendum)
Heart Failure Clinic appointment on Aug 08, 2016 at 12:00pm with Darylene Price, [place]. Please call [phone] to reschedule.

## 2016-07-28 NOTE — Telephone Encounter (Signed)
TCM follow-up needed in one week with INR check on Wednesday as she was started on amiodarone  Scheduled OV and INR check 4/26. Pt currently admitted

## 2016-07-28 NOTE — Anesthesia Post-op Follow-up Note (Cosign Needed)
Anesthesia QCDR form completed.        

## 2016-07-28 NOTE — Transfer of Care (Signed)
Immediate Anesthesia Transfer of Care Note  Patient: Meghan [name]  Procedure(s) Performed: Procedure(s): CARDIOVERSION (N/A)  Patient Location: PACU  Anesthesia Type:General  Level of Consciousness: sedated  Airway & Oxygen Therapy: Patient Spontanous Breathing and Patient connected to nasal cannula oxygen  Post-op Assessment: Report given to RN and Post -op Vital signs reviewed and stable  Post vital signs: Reviewed and stable  Last Vitals:  Vitals:   07/28/16 0600 07/28/16 0751  BP: (!) 98/58 108/85  Pulse: (!) 109 (!) 103  Resp: 18   Temp: 36.6 C 36.5 C    Last Pain:  Vitals:   07/28/16 0751  TempSrc: Oral         Complications: No apparent anesthesia complications

## 2016-07-28 NOTE — Care Management (Signed)
Presents from home.  Independent in all adls. Denies issues accessing medical care and meds.  No discharge needs identified.  No cost prohibitive anticoagulation medications ordered.

## 2016-07-28 NOTE — Anesthesia Procedure Notes (Signed)
Date/Time: 07/28/2016 8:01 AM Performed by: Allean Found Pre-anesthesia Checklist: Patient identified, Emergency Drugs available, Suction available, Patient being monitored and Timeout performed Oxygen Delivery Method: Nasal cannula Placement Confirmation: positive ETCO2

## 2016-07-28 NOTE — Care Management Important Message (Signed)
Important Message  Patient Details  Name: Meghan [name] MRN: [id number] Date of Birth: [birth year]   Medicare Important Message Given:  N/A - LOS <3 / Initial given by admissions    Katrina Stack, RN 07/28/2016, 2:31 PM

## 2016-07-28 NOTE — Anesthesia Postprocedure Evaluation (Signed)
Anesthesia Post Note  Patient: Meghan [name]  Procedure(s) Performed: Procedure(s) (LRB): CARDIOVERSION (N/A)  Patient location during evaluation: PACU Anesthesia Type: General Level of consciousness: awake and alert and oriented Pain management: pain level controlled Vital Signs Assessment: post-procedure vital signs reviewed and stable Respiratory status: spontaneous breathing Cardiovascular status: blood pressure returned to baseline Anesthetic complications: no     Last Vitals:  Vitals:   07/28/16 0812 07/28/16 0830  BP: 91/63 113/72  Pulse: 63 63  Resp: 20 14  Temp:      Last Pain:  Vitals:   07/28/16 0751  TempSrc: Oral                 Sriya Kroeze

## 2016-07-28 NOTE — CV Procedure (Signed)
Cardioversion note: A standard informed consent was obtained. Timeout was performed. The pads were placed in the anterior posterior fashion. The patient was given propofol by the anesthesia team.  Successful cardioversion was performed with a 200 J. The patient converted to sinus rhythm. Pre-and post EKGs were reviewed. The patient tolerated the procedure with no immediate complications.  Recommendations: Continue same medications but decrease Amiodarone.

## 2016-07-28 NOTE — Progress Notes (Signed)
ANTICOAGULATION CONSULT NOTE - FOLLOW UP  Consult  Pharmacy Consult for warfarin  Indication: atrial fibrillation  No Known Allergies  Patient Measurements: Height: 5\' 7"  (170.2 cm) Weight: 238 lb 12.8 oz (108.3 kg) IBW/kg (Calculated) : 61.6  Vital Signs: Temp: 97.7 F (36.5 C) (04/20 0751) Temp Source: Oral (04/20 0751) BP: 113/72 (04/20 0830) Pulse Rate: 63 (04/20 0830)  Labs:  Recent Labs  07/26/16 0836 07/26/16 1538 07/26/16 2110 07/27/16 0322 07/28/16 0531  HGB 9.4*  --   --  9.0*  --   HCT 30.1*  --   --  28.7*  --   PLT 357  --   --  316  --   LABPROT 30.5*  --   --  32.7* 36.4*  INR 2.85  --   --  3.11 3.55  CREATININE 1.22*  --   --  1.09*  --   TROPONINI <0.03 <0.03 <0.03 <0.03  --     Estimated Creatinine Clearance: 62.6 mL/min (A) (by C-G formula based on SCr of 1.09 mg/dL (H)).   Medical History: Past Medical History:  Diagnosis Date  . Cardiomyopathy ([place])    a. 06/2013 Echo: EF 35-40%, glob HK, mildly dil LA, mild LVH, mild TR, mild to mod MR.  . Carotid arterial disease ([place])    a. 02/2012 s/p L CEA.  . Coronary artery disease    a. Presumed - 02/2012 Lexi MV: Anterior, anteroseptal, septal, apical infarct with mild peri-infarct ischemia, EF 41%- medically managed.  . 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 Hyperlipidemia   . Hypertension   . Morbid obesity ([place])   . Stroke [facility]) 2013    Medications:  Scheduled:  . amiodarone  200 mg Oral BID  . [MAR Hold] atorvastatin  10 mg Oral Daily  . [MAR Hold] docusate sodium  100 mg Oral BID  . furosemide  40 mg Oral Daily  . [MAR Hold] glipiZIDE  10 mg Oral BID  . [MAR Hold] hydrocerin   Topical BID  . [MAR Hold] insulin aspart  0-15 Units Subcutaneous TID WC  . [MAR Hold] insulin aspart  0-5 Units Subcutaneous QHS  . [MAR Hold] insulin aspart  4 Units Subcutaneous TID WC  . [MAR Hold] lisinopril  10 mg Oral Daily  . [MAR Hold] metFORMIN  500 mg Oral BID  . metoprolol  succinate  50 mg Oral Daily  . [MAR Hold] sodium chloride flush  3 mL Intravenous Q12H  . [MAR Hold] sodium chloride flush  3 mL Intravenous Q12H  . [MAR Hold] Warfarin - Pharmacist Dosing Inpatient   Does not apply q1800    Assessment: Pharmacy consulted to dose warfarin in this 56 yoF admitted with afib with RVR (on warfarin PTA) and CHF exacerbation. Per ED physician patient does not have signs of bleeding. Per PTA medication list patient takes warfarin 6 mg on Monday, Thursday, Saturday and 9 mg on Tue, W, F, Sunday.   4/20: Patient initiated on Amiodarone by cardiology.   Dosing history (inpatient)   4/18: 2.85; warfarin 9 mg  4/19: 3.1; warfarin 6 mg  4/20: 3.55; hold warfarin   Goal of Therapy:  INR 2-3 Monitor platelets by anticoagulation protocol: Yes   Plan:  Hold warfarin dose today. Likely will need reduction in home dose of warfarin now since now on amiodarone. Will recheck PT/INR with am labs.  07/28/2016 8:52 AM

## 2016-07-28 NOTE — Discharge Summary (Signed)
[facility] NAME: Meghan [name]    MR#:  [id number]  DATE OF BIRTH:  [birth year]  DATE OF ADMISSION:  07/26/2016 ADMITTING PHYSICIAN: Theodoro Grist, MD  DATE OF DISCHARGE:   PRIMARY CARE PHYSICIAN: Kathlyn Sacramento, MD    ADMISSION DIAGNOSIS:  Paroxysmal atrial fibrillation ([place]) [I48.0] SOB (shortness of breath) [R06.02] Acute on chronic congestive heart failure, unspecified heart failure type ([place]) [I50.9]  DISCHARGE DIAGNOSIS:  Afib with RVR s/p cardioversion Chronic Anticoagulation Ischemiic cardiomyopathy-Chronic  SECONDARY DIAGNOSIS:   Past Medical History:  Diagnosis Date  . Cardiomyopathy ([place])    a. 06/2013 Echo: EF 35-40%, glob HK, mildly dil LA, mild LVH, mild TR, mild to mod MR.  . Carotid arterial disease ([place])    a. 02/2012 s/p L CEA.  . Coronary artery disease    a. Presumed - 02/2012 Lexi MV: Anterior, anteroseptal, septal, apical infarct with mild peri-infarct ischemia, EF 41%- medically managed.  . 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 Hyperlipidemia   . Hypertension   . Morbid obesity ([place])   . Stroke [facility] 2013    HOSPITAL COURSE:   [name] a 66 y.o.femalewith a known history of Stroke, paroxysmal atrial fibrillation, hypertension, hyperlipidemia, diabetes, coronary artery disease, cardiomyopathy with ejection fraction of 35-40% according to echocardiogram in 2015, who presents to the hospital with complaints of shortness of breath, lower extremity swelling, orthopnea, PND.   #1. A. fib, RVR - on metoprolol, and amiodarone 400 mg tid--changed to 200 mg bid - continue Coumadin, therapeutic INR -seen by Drs Fletcher Anon and Caryl Comes and pt is s/p Cardioversion today. Now in SR -coumadin dose adjusted.  #2. Acute on chronic systolic CHF, initiate a diuretic, continue ACE inhibitor, follow ins and outs, daily weight -EF 25-30% -IV lasix bid--change to oral home dose -on  lisinopril  #3. CKD stage III, followed with diuresis 1.22--1.09  #4. Hyponatremia, follow with diuresis -134--136  #5. Anemia, follow with diuresis, get Hemoccult No GI w/u found in the chart Defer to PCP   #6. Diabetes mellitus, continue outpatient medications, sliding scale insulin resume metformin and glipizide  D/c home. D/w family CONSULTS OBTAINED:  Treatment Team:  Nelva Bush, MD  DRUG ALLERGIES:  No Known Allergies  DISCHARGE MEDICATIONS:   Current Discharge Medication List    START taking these medications   Details  amiodarone (PACERONE) 200 MG tablet Take 1 tablet (200 mg total) by mouth 2 (two) times daily. Qty: 60 tablet, Refills: 1    metoprolol succinate (TOPROL-XL) 50 MG 24 hr tablet Take 1 tablet (50 mg total) by mouth daily. Take with or immediately following a meal. Qty: 30 tablet, Refills: 1      CONTINUE these medications which have CHANGED   Details  warfarin (COUMADIN) 5 MG tablet Take 1 tablet (5 mg total) by mouth one time only at 6 PM. Start from Sunday April 22nd Qty: 30 tablet, Refills: 0      CONTINUE these medications which have NOT CHANGED   Details  atorvastatin (LIPITOR) 10 MG tablet TAKE ONE TABLET BY MOUTH ONCE DAILY Qty: 90 tablet, Refills: 3    furosemide (LASIX) 20 MG tablet TAKE ONE TABLET BY MOUTH ONCE DAILY Qty: 90 tablet, Refills: 3    glipiZIDE (GLUCOTROL) 10 MG tablet Take 10 mg by mouth 2 (two) times daily.    lisinopril (PRINIVIL,ZESTRIL) 10 MG tablet TAKE ONE TABLET BY MOUTH ONCE DAILY Qty: 90 tablet, Refills:  3    metFORMIN (GLUCOPHAGE) 500 MG tablet Take 500 mg by mouth 2 (two) times daily.       STOP taking these medications     carvedilol (COREG) 6.25 MG tablet         If you experience worsening of your admission symptoms, develop shortness of breath, life threatening emergency, suicidal or homicidal thoughts you must seek medical attention immediately by calling 911 or calling your MD  immediately  if symptoms less severe.  You Must read complete instructions/literature along with all the possible adverse reactions/side effects for all the Medicines you take and that have been prescribed to you. Take any new Medicines after you have completely understood and accept all the possible adverse reactions/side effects.   Please note  You were cared for by a hospitalist during your hospital stay. If you have any questions about your discharge medications or the care you received while you were in the hospital after you are discharged, you can call the unit and asked to speak with the hospitalist on call if the hospitalist that took care of you is not available. Once you are discharged, your primary care physician will handle any further medical issues. Please note that NO REFILLS for any discharge medications will be authorized once you are discharged, as it is imperative that you return to your primary care physician (or establish a relationship with a primary care physician if you do not have one) for your aftercare needs so that they can reassess your need for medications and monitor your lab values. Today   SUBJECTIVE   Doing well VITAL SIGNS:  Blood pressure 113/72, pulse 63, temperature 97.7 F (36.5 C), temperature source Oral, resp. rate 14, height 5\' 7"  (1.702 m), weight 108.3 kg (238 lb 12.8 oz), SpO2 97 %.  I/O:   Intake/Output Summary (Last 24 hours) at 07/28/16 1148 Last data filed at 07/28/16 0824  Gross per 24 hour  Intake               50 ml  Output              650 ml  Net             -600 ml    PHYSICAL EXAMINATION:  GENERAL:  66 y.o.-year-old patient lying in the bed with no acute distress.  EYES: Pupils equal, round, reactive to light and accommodation. No scleral icterus. Extraocular muscles intact.  HEENT: Head atraumatic, normocephalic. Oropharynx and nasopharynx clear.  NECK:  Supple, no jugular venous distention. No thyroid enlargement, no  tenderness.  LUNGS: Normal breath sounds bilaterally, no wheezing, rales,rhonchi or crepitation. No use of accessory muscles of respiration.  CARDIOVASCULAR: S1, S2 normal. No murmurs, rubs, or gallops.  ABDOMEN: Soft, non-tender, non-distended. Bowel sounds present. No organomegaly or mass.  EXTREMITIES: No pedal edema, cyanosis, or clubbing.  NEUROLOGIC: Cranial nerves II through XII are intact. Muscle strength 5/5 in all extremities. Sensation intact. Gait not checked.  PSYCHIATRIC: The patient is alert and oriented x 3.  SKIN: No obvious rash, lesion, or ulcer.   DATA REVIEW:   CBC   Recent Labs Lab 07/27/16 0322  WBC 6.2  HGB 9.0*  HCT 28.7*  PLT 316    Chemistries   Recent Labs Lab 07/26/16 0836 07/27/16 0322  NA 134* 136  K 4.8 3.8  CL 101 101  CO2 26 27  GLUCOSE 200* 140*  BUN 27* 29*  CREATININE 1.22* 1.09*  CALCIUM 8.9 8.8*  AST 23  --   ALT 13*  --   ALKPHOS 79  --   BILITOT 1.5*  --     Microbiology Results   No results found for this or any previous visit (from the past 240 hour(s)).  RADIOLOGY:  No results found.   Management plans discussed with the patient, family and they are in agreement.  CODE STATUS:     Code Status Orders        Start     Ordered   07/26/16 1531  Full code  Continuous     07/26/16 1530    Code Status History    Date Active Date Inactive Code Status Order ID Comments User Context   This patient has a current code status but no historical code status.      TOTAL TIME TAKING CARE OF THIS PATIENT: 40 minutes.    Shamekia Tippets M.D on 07/28/2016 at 11:48 AM  Between 7am to 6pm - Pager - [phone] After 6pm go to [email or web address] - password [email or web address] [facility]  Office  [phone]  CC: Primary care physician; Kathlyn Sacramento, MD

## 2016-07-28 NOTE — Discharge Planning (Addendum)
Patient IV and Tele DCd.  Discharge papers given, explained and educated.  Informed of scripts sent to [facility]).  Patient told of suggested FU appts and appts made.  RN assessment and VS revealed stability for DC to home.  Once ready, patient will be wheeled to front and family transporting home via car.

## 2016-07-28 NOTE — Anesthesia Preprocedure Evaluation (Signed)
Anesthesia Evaluation  Patient identified by MRN, date of birth, ID band Patient awake    Reviewed: Allergy & Precautions, NPO status , Patient's Chart, lab work & pertinent test results, reviewed documented beta blocker date and time   Airway Mallampati: III       Dental   Pulmonary shortness of breath and with exertion, former smoker,    Pulmonary exam normal        Cardiovascular hypertension, Pt. on medications and Pt. on home beta blockers + CAD, + Peripheral Vascular Disease and +CHF  Normal cardiovascular exam+ dysrhythmias Atrial Fibrillation      Neuro/Psych CVA negative psych ROS   GI/Hepatic Neg liver ROS,   Endo/Other  diabetes, Type 2, Oral Hypoglycemic Agents, Insulin Dependent  Renal/GU Renal InsufficiencyRenal disease  negative genitourinary   Musculoskeletal   Abdominal Normal abdominal exam  (+)   Peds negative pediatric ROS (+)  Hematology  (+) anemia ,   Anesthesia Other Findings   Reproductive/Obstetrics                             Anesthesia Physical Anesthesia Plan  ASA: IV  Anesthesia Plan: General   Post-op Pain Management:    Induction: Intravenous  Airway Management Planned: Nasal Cannula  Additional Equipment:   Intra-op Plan:   Post-operative Plan:   Informed Consent:   Dental advisory given  Plan Discussed with: CRNA and Surgeon  Anesthesia Plan Comments:         Anesthesia Quick Evaluation

## 2016-07-28 NOTE — Progress Notes (Signed)
Progress Note  Patient Name: Meghan [name] Date of Encounter: 07/28/2016  Primary Cardiologist: Dr. Fletcher Anon  Subjective   The patient reports improvement in shortness of breath. She underwent successful cardioversion to normal sinus rhythm this morning without immediate complications.  Inpatient Medications    Scheduled Meds: . amiodarone  200 mg Oral BID  . [MAR Hold] atorvastatin  10 mg Oral Daily  . [MAR Hold] docusate sodium  100 mg Oral BID  . furosemide  40 mg Oral Daily  . [MAR Hold] glipiZIDE  10 mg Oral BID  . [MAR Hold] hydrocerin   Topical BID  . [MAR Hold] insulin aspart  0-15 Units Subcutaneous TID WC  . [MAR Hold] insulin aspart  0-5 Units Subcutaneous QHS  . [MAR Hold] insulin aspart  4 Units Subcutaneous TID WC  . [MAR Hold] lisinopril  10 mg Oral Daily  . [MAR Hold] metFORMIN  500 mg Oral BID  . metoprolol succinate  50 mg Oral Daily  . [MAR Hold] sodium chloride flush  3 mL Intravenous Q12H  . [MAR Hold] sodium chloride flush  3 mL Intravenous Q12H  . [MAR Hold] Warfarin - Pharmacist Dosing Inpatient   Does not apply q1800   Continuous Infusions: . sodium chloride     PRN Meds: [MAR Hold] acetaminophen **OR** [MAR Hold] acetaminophen, [MAR Hold] ondansetron **OR** [MAR Hold] ondansetron (ZOFRAN) IV, [MAR Hold] sodium chloride flush   Vital Signs    Vitals:   07/27/16 2046 07/28/16 0300 07/28/16 0600 07/28/16 0751  BP: 96/70  (!) 98/58 108/85  Pulse: (!) 118  (!) 109 (!) 103  Resp: 14  18   Temp: 97.9 F (36.6 C)  97.8 F (36.6 C) 97.7 F (36.5 C)  TempSrc:   Axillary Oral  SpO2: 95%  98%   Weight:  238 lb 12.8 oz (108.3 kg)    Height:        Intake/Output Summary (Last 24 hours) at 07/28/16 0839 Last data filed at 07/28/16 0824  Gross per 24 hour  Intake               50 ml  Output             1000 ml  Net             -950 ml   Filed Weights   07/26/16 0825 07/27/16 0441 07/28/16 0300  Weight: 230 lb (104.3 kg) 242 lb 11.2 oz (110.1 kg)  238 lb 12.8 oz (108.3 kg)    Telemetry    Atrial fibrillation with rapid ventricular response. - Personally Reviewed  ECG    Atrial fibrillation. Sinus rhythm after cardioversion - Personally Reviewed  Physical Exam   GEN: No acute distress.   Neck: No JVD Cardiac: RRR, no murmurs, rubs, or gallops.  Respiratory: Clear to auscultation bilaterally. GI: Soft, nontender, non-distended  MS: No edema; No deformity. Neuro:  Nonfocal  Psych: Normal affect   Labs    Chemistry Recent Labs Lab 07/26/16 0836 07/27/16 0322  NA 134* 136  K 4.8 3.8  CL 101 101  CO2 26 27  GLUCOSE 200* 140*  BUN 27* 29*  CREATININE 1.22* 1.09*  CALCIUM 8.9 8.8*  PROT 8.0  --   ALBUMIN 3.5  --   AST 23  --   ALT 13*  --   ALKPHOS 79  --   BILITOT 1.5*  --   GFRNONAA 44* 51*  GFRAA 52* 59*  ANIONGAP 7 8  Hematology Recent Labs Lab 07/26/16 0836 07/27/16 0322  WBC 6.6 6.2  RBC 3.56* 3.40*  HGB 9.4* 9.0*  HCT 30.1* 28.7*  MCV 84.6 84.6  MCH 26.3 26.6  MCHC 31.1* 31.4*  RDW 18.5* 18.4*  PLT 357 316    Cardiac Enzymes Recent Labs Lab 07/26/16 0836 07/26/16 1538 07/26/16 2110 07/27/16 0322  TROPONINI <0.03 <0.03 <0.03 <0.03   No results for input(s): TROPIPOC in the last 168 hours.   BNP Recent Labs Lab 07/26/16 0836  BNP 1,062.0*     DDimer No results for input(s): DDIMER in the last 168 hours.   Radiology    Dg Chest Port 1 View  Result Date: 07/26/2016 CLINICAL DATA:  Shortness of breath for last 2 weeks, history of stroke 2013 EXAM: PORTABLE CHEST 1 VIEW COMPARISON:  07/10/2013 FINDINGS: Cardiomegaly is noted. No pulmonary edema. There is small right pleural effusion. Trace left pleural effusion. Bilateral basilar atelectasis or infiltrate. Mild degenerative changes thoracic spine. IMPRESSION: No pulmonary edema. Cardiomegaly. Small right pleural effusion. Trace left pleural effusion. Bilateral basilar atelectasis or infiltrate. Electronically Signed   By: Lahoma Crocker M.D.   On: 07/26/2016 08:57    Cardiac Studies    ------------------------------------------------------------------- Transthoracic Echocardiography  - Left ventricle: The cavity size was normal. There was mild   concentric hypertrophy. Systolic function was severely reduced.   The estimated ejection fraction was in the range of 25% to 30%.   There is severe hypokinesis of the mid-apicalanteroseptal,   anterior, and apical myocardium. - Mitral valve: Calcified annulus. There was mild regurgitation. - Left atrium: The atrium was severely dilated. - Right atrium: The atrium was moderately dilated. - Tricuspid valve: There was moderate regurgitation. - Pulmonary arteries: Systolic pressure was mildly increased. PA   peak pressure: 40 mm Hg (S).  Patient Profile     66 y.o. female with past medical history of presumed ischemic cardiomyopathy, proximal atrial fibrillation, hypertension and anemia who presented with acute on chronic systolic heart failure in the setting of A. fib with RVR. Echocardiogram showed worsening ejection fraction to 25-30%.  Assessment & Plan    1. Acute on chronic systolic heart failure: Likely triggered by A. fib with RVR. The patient volume status improved and I switched furosemide to 40 mg by mouth once daily. I switched metoprolol to Toprol. Continue lisinopril. The plan is to repeat echocardiogram in 2-3 months and if EF does not go back to baseline, she will require cardiac catheterization.  2. Persistent atrial fibrillation: Converted to sinus rhythm successfully after cardioversion. I decreased the amiodarone to 200 mg twice daily. I consulted pharmacy to adjust warfarin dose due to interaction with amiodarone.  3. Anemia: Unclear etiology. This will require further investigation.  Possible discharge home in the afternoon on if the patient is stable after cardioversion. I will arrange for follow-up in our office including INR  checked.  Signed, Kathlyn Sacramento, MD  07/28/2016, 8:39 AM

## 2016-07-31 NOTE — Telephone Encounter (Signed)
Left detailed message on pt's cell VM requesting call back.

## 2016-07-31 NOTE — Telephone Encounter (Signed)
Patient contacted regarding discharge from [facility] on 4/20.  Patient understands to follow up with provider Arida on 4/26 at 2:20pm at [facility] [place]. Patient understands discharge instructions? yes Patient understands medications and regiment? yes Patient understands to bring all medications to this visit? yes   She understands we will check coumadin at this appt.

## 2016-08-03 ENCOUNTER — Encounter: Payer: Self-pay | Admitting: Cardiovascular Disease

## 2016-08-03 ENCOUNTER — Ambulatory Visit (INDEPENDENT_AMBULATORY_CARE_PROVIDER_SITE_OTHER): Payer: Medicare Other

## 2016-08-03 ENCOUNTER — Ambulatory Visit (INDEPENDENT_AMBULATORY_CARE_PROVIDER_SITE_OTHER): Payer: Medicare Other | Admitting: Cardiovascular Disease

## 2016-08-03 VITALS — BP 128/66 | HR 117 | Ht 67.0 in | Wt 249.5 lb

## 2016-08-03 DIAGNOSIS — I2510 Atherosclerotic heart disease of native coronary artery without angina pectoris: Secondary | ICD-10-CM

## 2016-08-03 DIAGNOSIS — I4891 Unspecified atrial fibrillation: Secondary | ICD-10-CM | POA: Diagnosis not present

## 2016-08-03 DIAGNOSIS — I5023 Acute on chronic systolic (congestive) heart failure: Secondary | ICD-10-CM | POA: Diagnosis not present

## 2016-08-03 DIAGNOSIS — I10 Essential (primary) hypertension: Secondary | ICD-10-CM | POA: Diagnosis not present

## 2016-08-03 DIAGNOSIS — I4819 Other persistent atrial fibrillation: Secondary | ICD-10-CM

## 2016-08-03 DIAGNOSIS — Z5181 Encounter for therapeutic drug level monitoring: Secondary | ICD-10-CM

## 2016-08-03 DIAGNOSIS — I481 Persistent atrial fibrillation: Secondary | ICD-10-CM | POA: Diagnosis not present

## 2016-08-03 LAB — POCT INR: INR: 2.2

## 2016-08-03 MED ORDER — FUROSEMIDE 40 MG PO TABS: 40.0000 mg | ORAL_TABLET | Freq: Every day | ORAL | 3 refills | Status: DC

## 2016-08-03 NOTE — Progress Notes (Signed)
Cardiology Office Note   Date:  08/03/2016   ID:  Meghan [name], DOB 1951-03-03, MRN [id number]  PCP:  Kathlyn Sacramento, MD  Cardiologist:   Kathlyn Sacramento, MD  Endocrinologist: Dr. Atha Starks  Chief Complaint  Patient presents with  . other    hospital follow up. Patient c/o SOB and weak legs. Meds reviewed verbally with patient.       History of Present Illness: Meghan [name] is a 66 y.o. female who presents for a followup visit regarding Chronic systolic heart failure, coronary artery disease and persistent atrial fibrillation. Other medical problems include hypertension, 2 diabetes, hyperlipidemia and carotid artery disease status post left carotid endarterectomy with previous CVA.  She was evaluated in 2013 initially in the setting of acute CVA. A 2D echo was performed revealing EF 45-50% and anterior HK, severe apical HK, impaired LV relaxation, mild LA dilatation noted at that time. She subsequently underwent Lexiscan Myoview confirming prior anterior infarct, no evidence of ischemia. EF 41%. She was treated medically with ASA, BB, statin. She followed up 04/2012. No anginal or CHF type symptoms. Declined outpatient cath.  She had atrial fibrillation in the setting of acute cholecystitis in March 2015. Echocardiogram while hospitalized showed an ejection fraction of 35-40%.   She presented recently with heart failure symptoms in the setting of atrial fibrillation with rapid ventricular response. She was hospitalized at [facility] and improved with IV diuresis. She underwent an echocardiogram which showed a drop in LV systolic function with an EF of 25-30% with severe hypokinesis of the mid apical anteroseptal, anterior and apical myocardium. The patient was started on amiodarone and underwent successful cardioversion to normal sinus rhythm.  She reports improvement in shortness of breath although she reports recurrent fatigue and leg edema. She is noted to be back in atrial fibrillation  today.  Past Medical History:  Diagnosis Date  . Cardiomyopathy ([place])    a. 06/2013 Echo: EF 35-40%, glob HK, mildly dil LA, mild LVH, mild TR, mild to mod MR.  . Carotid arterial disease ([place])    a. 02/2012 s/p L CEA.  . Coronary artery disease    a. Presumed - 02/2012 Meghan MV: Anterior, anteroseptal, septal, apical infarct with mild peri-infarct ischemia, EF 41%- medically managed.  . 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 Hyperlipidemia   . Hypertension   . Morbid obesity ([place])   . Stroke [facility]) 2013    Past Surgical History:  Procedure Laterality Date  . CARDIOVERSION N/A 07/28/2016   Procedure: CARDIOVERSION;  Surgeon: Wellington Hampshire, MD;  Location: [facility] ORS;  Service: Cardiovascular;  Laterality: N/A;  . CAROTID ENDARTERECTOMY     [facility]; Dr. Lucky Cowboy  . CHOLECYSTECTOMY    . LEG SURGERY     right;infection     Current Outpatient Prescriptions  Medication Sig Dispense Refill  . amiodarone (PACERONE) 200 MG tablet Take 1 tablet (200 mg total) by mouth 2 (two) times daily. 60 tablet 1  . atorvastatin (LIPITOR) 10 MG tablet TAKE ONE TABLET BY MOUTH ONCE DAILY 90 tablet 3  . furosemide (LASIX) 20 MG tablet TAKE ONE TABLET BY MOUTH ONCE DAILY 90 tablet 3  . glipiZIDE (GLUCOTROL) 10 MG tablet Take 10 mg by mouth 2 (two) times daily.    Marland Kitchen lisinopril (PRINIVIL,ZESTRIL) 10 MG tablet TAKE ONE TABLET BY MOUTH ONCE DAILY 90 tablet 3  . metFORMIN (GLUCOPHAGE) 500 MG tablet Take 500 mg by mouth 2 (two) times daily.     Marland Kitchen  metoprolol succinate (TOPROL-XL) 50 MG 24 hr tablet Take 1 tablet (50 mg total) by mouth daily. Take with or immediately following a meal. 30 tablet 1  . warfarin (COUMADIN) 5 MG tablet Take 1 tablet (5 mg total) by mouth one time only at 6 PM. Start from Sunday April 22nd 30 tablet 0   No current facility-administered medications for this visit.     Allergies:   Patient has no known allergies.    Social History:  The patient  reports that she has  quit smoking. Her smoking use included Cigarettes. She has a 2.50 pack-year smoking history. She has never used smokeless tobacco. She reports that she does not drink alcohol or use drugs.   Family History:  The patient's family history includes Heart disease in her mother.    ROS:  Please see the history of present illness.   Otherwise, review of systems are positive for none.   All other systems are reviewed and negative.    PHYSICAL EXAM: VS:  BP 128/66 (BP Location: Left Arm, Patient Position: Sitting, Cuff Size: Normal)   Pulse (!) 117   Ht 5\' 7"  (1.702 m)   Wt 249 lb 8 oz (113.2 kg)   BMI 39.08 kg/m  , BMI Body mass index is 39.08 kg/m. GEN: Well nourished, well developed, in no acute distress HEENT: normal Neck: no JVD, carotid bruits, or masses Cardiac: Irregularly irregular; no murmurs, rubs, or gallops, mild to moderate bilateral leg edema  Respiratory:  Bibasilar crackles, normal work of breathing GI: soft, nontender, nondistended, + BS MS: no deformity or atrophy Skin: warm and dry, no rash Neuro:  Strength and sensation are intact Psych: euthymic mood, full affect   EKG:  EKG is ordered today. The ekg ordered today demonstrates normal  atrial fibrillation with rapid ventricular response with old septal infarct.   Recent Labs: 07/26/2016: ALT 13; B Natriuretic Peptide 1,062.0; TSH 5.260 07/27/2016: BUN 29; Creatinine, Ser 1.09; Hemoglobin 9.0; Platelets 316; Potassium 3.8; Sodium 136    Lipid Panel    Component Value Date/Time   CHOL 165 02/14/2012 0410   TRIG 168 02/14/2012 0410   HDL 31 (L) 02/14/2012 0410   VLDL 34 02/14/2012 0410   LDLCALC 100 02/14/2012 0410      Wt Readings from Last 3 Encounters:  08/03/16 249 lb 8 oz (113.2 kg)  07/28/16 238 lb 12.8 oz (108.3 kg)  10/14/15 234 lb 6.4 oz (106.3 kg)       ASSESSMENT AND PLAN:  1.   Persistent atrial fibrillation: Unfortunately, she is noted to be back in atrial fibrillation after recent  successful cardioversion. She continues to show signs of heart failure and rate has been difficult to control. Thus, I recommend proceeding with repeat cardioversion given that her INR continues to be therapeutic. I am hoping that the chance of keeping her in sinus rhythm is higher now given that she has been on amiodarone for a week. Continue anticoagulation with warfarin given her high CHADs VASc score.   2. Acute on chronic systolic heart failure: Most recent ejection fraction was 25-30% which has dropped from prior of 35-40%. Continue treatment with metoprolol and lisinopril. She appears to be volume overloaded I increased the dose of furosemide to 40 mg once daily. The plan is to repeat echocardiogram once she stays in sinus rhythm for at least 3 months to reevaluate the EF. If EF does not go back to her baseline, I will plan on proceeding with cardiac catheterization.  3. Essential hypertension: Blood pressure is Controlled.  4. Coronary artery disease involving native coronary arteries without angina: Continue medical therapy.   5. Hyperlipidemia: Continue treatment with atorvastatin. She will require follow-up lipid and liver profile.   Disposition:   FU with me in 1 months  Signed,  Kathlyn Sacramento, MD  08/03/2016 2:32 PM    [place]

## 2016-08-03 NOTE — Patient Instructions (Addendum)
Medication Instructions:  Your physician has recommended you make the following change in your medication:  INCREASE lasix to 40mg  once daily   Labwork: None today Recheck coumadin 5/3   Testing/Procedures: Your physician has recommended that you have a Cardioversion (DCCV). Electrical Cardioversion uses a jolt of electricity to your heart either through paddles or wired patches attached to your chest. This is a controlled, usually prescheduled, procedure. Defibrillation is done under light anesthesia in the hospital, and you usually go home the day of the procedure. This is done to get your heart back into a normal rhythm. You are not awake for the procedure. Please see the instruction sheet given to you today.  Monday, April 30 [facility] Entrance Arrival time: 6:30am Nothing to eat or drink after midnight the evening before your cardioversion Please take your morning medications with a sip of water.   Follow-Up: Your physician recommends that you schedule a follow-up appointment in 2 weeks with Dr. Fletcher Anon.    Any Other Special Instructions Will Be Listed Below (If Applicable).     If you need a refill on your cardiac medications before your next appointment, please call your pharmacy.   Electrical Cardioversion Electrical cardioversion is the delivery of a jolt of electricity to restore a normal rhythm to the heart. A rhythm that is too fast or is not regular keeps the heart from pumping well. In this procedure, sticky patches or metal paddles are placed on the chest to deliver electricity to the heart from a device. This procedure may be done in an emergency if:  There is low or no blood pressure as a result of the heart rhythm.  Normal rhythm must be restored as fast as possible to protect the brain and heart from further damage.  It may save a life. This procedure may also be done for irregular or fast heart rhythms that are not  immediately life-threatening. Tell a health care provider about:  Any allergies you have.  All medicines you are taking, including vitamins, herbs, eye drops, creams, and over-the-counter medicines.  Any problems you or family members have had with anesthetic medicines.  Any blood disorders you have.  Any surgeries you have had.  Any medical conditions you have.  Whether you are pregnant or may be pregnant. What are the risks? Generally, this is a safe procedure. However, problems may occur, including:  Allergic reactions to medicines.  A blood clot that breaks free and travels to other parts of your body.  The possible return of an abnormal heart rhythm within hours or days after the procedure.  Your heart stopping (cardiac arrest). This is rare. What happens before the procedure? Medicines   Your health care provider may have you start taking:  Blood-thinning medicines (anticoagulants) so your blood does not clot as easily.  Medicines may be given to help stabilize your heart rate and rhythm.  Ask your health care provider about changing or stopping your regular medicines. This is especially important if you are taking diabetes medicines or blood thinners. General instructions   Plan to have someone take you home from the hospital or clinic.  If you will be going home right after the procedure, plan to have someone with you for 24 hours.  Follow instructions from your health care provider about eating or drinking restrictions. What happens during the procedure?  To lower your risk of infection:  Your health care team will wash or sanitize their hands.  Your skin  will be washed with soap.  An IV tube will be inserted into one of your veins.  You will be given a medicine to help you relax (sedative).  Sticky patches (electrodes) or metal paddles may be placed on your chest.  An electrical shock will be delivered. The procedure may vary among health care  providers and hospitals. What happens after the procedure?  Your blood pressure, heart rate, breathing rate, and blood oxygen level will be monitored until the medicines you were given have worn off.  Do not drive for 24 hours if you were given a sedative.  Your heart rhythm will be watched to make sure it does not change. This information is not intended to replace advice given to you by your health care provider. Make sure you discuss any questions you have with your health care provider. Document Released: 03/17/2002 Document Revised: 11/24/2015 Document Reviewed: 10/01/2015 Elsevier Interactive Patient Education  2017 [employer].  Electrical Cardioversion, Care After This sheet gives you information about how to care for yourself after your procedure. Your health care provider may also give you more specific instructions. If you have problems or questions, contact your health care provider. What can I expect after the procedure? After the procedure, it is common to have:  Some redness on the skin where the shocks were given. Follow these instructions at home:  Do not drive for 24 hours if you were given a medicine to help you relax (sedative).  Take over-the-counter and prescription medicines only as told by your health care provider.  Ask your health care provider how to check your pulse. Check it often.  Rest for 48 hours after the procedure or as told by your health care provider.  Avoid or limit your caffeine use as told by your health care provider. Contact a health care provider if:  You feel like your heart is beating too quickly or your pulse is not regular.  You have a serious muscle cramp that does not go away. Get help right away if:  You have discomfort in your chest.  You are dizzy or you feel faint.  You have trouble breathing or you are short of breath.  Your speech is slurred.  You have trouble moving an arm or leg on one side of your body.  Your  fingers or toes turn cold or blue. This information is not intended to replace advice given to you by your health care provider. Make sure you discuss any questions you have with your health care provider. Document Released: 01/15/2013 Document Revised: 10/29/2015 Document Reviewed: 10/01/2015 Elsevier Interactive Patient Education  2017 [employer].

## 2016-08-07 ENCOUNTER — Ambulatory Visit: Payer: Medicare Other | Admitting: Certified Registered Nurse Anesthetist

## 2016-08-07 ENCOUNTER — Ambulatory Visit
Admission: RE | Admit: 2016-08-07 | Discharge: 2016-08-07 | Disposition: A | Discharge status: Home / Self Care | Payer: Medicare Other | Source: Ambulatory Visit | Attending: Cardiovascular Disease | Admitting: Cardiovascular Disease

## 2016-08-07 ENCOUNTER — Encounter: Payer: Self-pay | Admitting: Cardiovascular Disease

## 2016-08-07 ENCOUNTER — Encounter
Admission: RE | Disposition: A | Discharge status: Home / Self Care | Payer: Self-pay | Source: Ambulatory Visit | Attending: Cardiovascular Disease

## 2016-08-07 ENCOUNTER — Other Ambulatory Visit: Payer: Self-pay

## 2016-08-07 ENCOUNTER — Telehealth: Payer: Self-pay | Admitting: Cardiovascular Disease

## 2016-08-07 DIAGNOSIS — Z7901 Long term (current) use of anticoagulants: Secondary | ICD-10-CM | POA: Diagnosis not present

## 2016-08-07 DIAGNOSIS — I5023 Acute on chronic systolic (congestive) heart failure: Secondary | ICD-10-CM | POA: Diagnosis not present

## 2016-08-07 DIAGNOSIS — E119 Type 2 diabetes mellitus without complications: Secondary | ICD-10-CM | POA: Diagnosis not present

## 2016-08-07 DIAGNOSIS — I429 Cardiomyopathy, unspecified: Secondary | ICD-10-CM | POA: Insufficient documentation

## 2016-08-07 DIAGNOSIS — Z7984 Long term (current) use of oral hypoglycemic drugs: Secondary | ICD-10-CM | POA: Diagnosis not present

## 2016-08-07 DIAGNOSIS — Z8673 Personal history of transient ischemic attack (TIA), and cerebral infarction without residual deficits: Secondary | ICD-10-CM | POA: Diagnosis not present

## 2016-08-07 DIAGNOSIS — I2510 Atherosclerotic heart disease of native coronary artery without angina pectoris: Secondary | ICD-10-CM | POA: Diagnosis not present

## 2016-08-07 DIAGNOSIS — Z6839 Body mass index (BMI) 39.0-39.9, adult: Secondary | ICD-10-CM | POA: Insufficient documentation

## 2016-08-07 DIAGNOSIS — I110 Hypertensive heart disease with heart failure: Secondary | ICD-10-CM | POA: Insufficient documentation

## 2016-08-07 DIAGNOSIS — E785 Hyperlipidemia, unspecified: Secondary | ICD-10-CM | POA: Insufficient documentation

## 2016-08-07 DIAGNOSIS — Z79899 Other long term (current) drug therapy: Secondary | ICD-10-CM | POA: Diagnosis not present

## 2016-08-07 DIAGNOSIS — Z87891 Personal history of nicotine dependence: Secondary | ICD-10-CM | POA: Insufficient documentation

## 2016-08-07 DIAGNOSIS — I481 Persistent atrial fibrillation: Secondary | ICD-10-CM | POA: Insufficient documentation

## 2016-08-07 DIAGNOSIS — I5022 Chronic systolic (congestive) heart failure: Secondary | ICD-10-CM

## 2016-08-07 HISTORY — PX: CARDIOVERSION: EP1203

## 2016-08-07 LAB — PROTIME-INR
INR: 1.9
Prothrombin Time: 22.1 seconds — ABNORMAL HIGH (ref 11.4–15.2)

## 2016-08-07 LAB — GLUCOSE, CAPILLARY: GLUCOSE-CAPILLARY: 170 mg/dL — AB (ref 65–99)

## 2016-08-07 SURGERY — CARDIOVERSION (CATH LAB)
Anesthesia: Monitor Anesthesia Care

## 2016-08-07 MED ORDER — MIDAZOLAM HCL 2 MG/2ML IJ SOLN
INTRAMUSCULAR | Status: AC
  Filled 2016-08-07: qty 2

## 2016-08-07 MED ORDER — PROPOFOL 10 MG/ML IV BOLUS
INTRAVENOUS | Status: AC
  Filled 2016-08-07: qty 20

## 2016-08-07 MED ORDER — EPHEDRINE SULFATE 50 MG/ML IJ SOLN
INTRAMUSCULAR | Status: AC
  Filled 2016-08-07: qty 1

## 2016-08-07 MED ORDER — SODIUM CHLORIDE 0.9 % IJ SOLN
INTRAMUSCULAR | Status: AC
  Filled 2016-08-07: qty 20

## 2016-08-07 MED ORDER — SODIUM CHLORIDE 0.9 % IJ SOLN
INTRAMUSCULAR | Status: AC
  Filled 2016-08-07: qty 10

## 2016-08-07 NOTE — Telephone Encounter (Signed)
Per Dr. Fletcher Anon: I increased her Lasix today to 40 mg bid for 3 days then back to once daily after. Check BMP in 2 days.  Lab order placed for [facility] outpatient lab. Left message on pt's VM to contact the office.

## 2016-08-07 NOTE — Interval H&P Note (Signed)
History and Physical Interval Note:  08/07/2016 8:08 AM  The patient presented for elective cardioversion. However, she was noted to be in sinus rhythm. Thus, cardioversion was aborted. She was noted to be volume overloaded by exam. I asked her to increase Lasix to 40 mg bid for 3 days than back to 40 mg once daily.   Meghan [name]

## 2016-08-07 NOTE — Discharge Instructions (Signed)
Increase Furosemide to 40 mg twice daily for 3 days then back to once daily after that.

## 2016-08-07 NOTE — OR Nursing (Signed)
Monitor has P waves, ?SR Dr Fletcher Anon paged and aware, 12 done x2

## 2016-08-07 NOTE — H&P (View-Only) (Signed)
Cardiology Office Note   Date:  08/03/2016   ID:  Meghan [name], DOB 03-18-1951, MRN [id number]  PCP:  Kathlyn Sacramento, MD  Cardiologist:   Kathlyn Sacramento, MD  Endocrinologist: Dr. Atha Starks  Chief Complaint  Patient presents with  . other    hospital follow up. Patient c/o SOB and weak legs. Meds reviewed verbally with patient.       History of Present Illness: Meghan [name] is a 66 y.o. female who presents for a followup visit regarding Chronic systolic heart failure, coronary artery disease and persistent atrial fibrillation. Other medical problems include hypertension, 2 diabetes, hyperlipidemia and carotid artery disease status post left carotid endarterectomy with previous CVA.  She was evaluated in 2013 initially in the setting of acute CVA. A 2D echo was performed revealing EF 45-50% and anterior HK, severe apical HK, impaired LV relaxation, mild LA dilatation noted at that time. She subsequently underwent Lexiscan Myoview confirming prior anterior infarct, no evidence of ischemia. EF 41%. She was treated medically with ASA, BB, statin. She followed up 04/2012. No anginal or CHF type symptoms. Declined outpatient cath.  She had atrial fibrillation in the setting of acute cholecystitis in March 2015. Echocardiogram while hospitalized showed an ejection fraction of 35-40%.   She presented recently with heart failure symptoms in the setting of atrial fibrillation with rapid ventricular response. She was hospitalized at [facility] and improved with IV diuresis. She underwent an echocardiogram which showed a drop in LV systolic function with an EF of 25-30% with severe hypokinesis of the mid apical anteroseptal, anterior and apical myocardium. The patient was started on amiodarone and underwent successful cardioversion to normal sinus rhythm.  She reports improvement in shortness of breath although she reports recurrent fatigue and leg edema. She is noted to be back in atrial fibrillation  today.  Past Medical History:  Diagnosis Date  . Cardiomyopathy ([place])    a. 06/2013 Echo: EF 35-40%, glob HK, mildly dil LA, mild LVH, mild TR, mild to mod MR.  . Carotid arterial disease ([place])    a. 02/2012 s/p L CEA.  . Coronary artery disease    a. Presumed - 02/2012 Lexi MV: Anterior, anteroseptal, septal, apical infarct with mild peri-infarct ischemia, EF 41%- medically managed.  . 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 Hyperlipidemia   . Hypertension   . Morbid obesity ([place])   . Stroke [facility]) 2013    Past Surgical History:  Procedure Laterality Date  . CARDIOVERSION N/A 07/28/2016   Procedure: CARDIOVERSION;  Surgeon: Wellington Hampshire, MD;  Location: [facility] ORS;  Service: Cardiovascular;  Laterality: N/A;  . CAROTID ENDARTERECTOMY     [facility]; Dr. Lucky Cowboy  . CHOLECYSTECTOMY    . LEG SURGERY     right;infection     Current Outpatient Prescriptions  Medication Sig Dispense Refill  . amiodarone (PACERONE) 200 MG tablet Take 1 tablet (200 mg total) by mouth 2 (two) times daily. 60 tablet 1  . atorvastatin (LIPITOR) 10 MG tablet TAKE ONE TABLET BY MOUTH ONCE DAILY 90 tablet 3  . furosemide (LASIX) 20 MG tablet TAKE ONE TABLET BY MOUTH ONCE DAILY 90 tablet 3  . glipiZIDE (GLUCOTROL) 10 MG tablet Take 10 mg by mouth 2 (two) times daily.    Marland Kitchen lisinopril (PRINIVIL,ZESTRIL) 10 MG tablet TAKE ONE TABLET BY MOUTH ONCE DAILY 90 tablet 3  . metFORMIN (GLUCOPHAGE) 500 MG tablet Take 500 mg by mouth 2 (two) times daily.     Marland Kitchen  metoprolol succinate (TOPROL-XL) 50 MG 24 hr tablet Take 1 tablet (50 mg total) by mouth daily. Take with or immediately following a meal. 30 tablet 1  . warfarin (COUMADIN) 5 MG tablet Take 1 tablet (5 mg total) by mouth one time only at 6 PM. Start from Sunday April 22nd 30 tablet 0   No current facility-administered medications for this visit.     Allergies:   Patient has no known allergies.    Social History:  The patient  reports that she has  quit smoking. Her smoking use included Cigarettes. She has a 2.50 pack-year smoking history. She has never used smokeless tobacco. She reports that she does not drink alcohol or use drugs.   Family History:  The patient's family history includes Heart disease in her mother.    ROS:  Please see the history of present illness.   Otherwise, review of systems are positive for none.   All other systems are reviewed and negative.    PHYSICAL EXAM: VS:  BP 128/66 (BP Location: Left Arm, Patient Position: Sitting, Cuff Size: Normal)   Pulse (!) 117   Ht 5\' 7"  (1.702 m)   Wt 249 lb 8 oz (113.2 kg)   BMI 39.08 kg/m  , BMI Body mass index is 39.08 kg/m. GEN: Well nourished, well developed, in no acute distress HEENT: normal Neck: no JVD, carotid bruits, or masses Cardiac: Irregularly irregular; no murmurs, rubs, or gallops, mild to moderate bilateral leg edema  Respiratory:  Bibasilar crackles, normal work of breathing GI: soft, nontender, nondistended, + BS MS: no deformity or atrophy Skin: warm and dry, no rash Neuro:  Strength and sensation are intact Psych: euthymic mood, full affect   EKG:  EKG is ordered today. The ekg ordered today demonstrates normal  atrial fibrillation with rapid ventricular response with old septal infarct.   Recent Labs: 07/26/2016: ALT 13; B Natriuretic Peptide 1,062.0; TSH 5.260 07/27/2016: BUN 29; Creatinine, Ser 1.09; Hemoglobin 9.0; Platelets 316; Potassium 3.8; Sodium 136    Lipid Panel    Component Value Date/Time   CHOL 165 02/14/2012 0410   TRIG 168 02/14/2012 0410   HDL 31 (L) 02/14/2012 0410   VLDL 34 02/14/2012 0410   LDLCALC 100 02/14/2012 0410      Wt Readings from Last 3 Encounters:  08/03/16 249 lb 8 oz (113.2 kg)  07/28/16 238 lb 12.8 oz (108.3 kg)  10/14/15 234 lb 6.4 oz (106.3 kg)       ASSESSMENT AND PLAN:  1.   Persistent atrial fibrillation: Unfortunately, she is noted to be back in atrial fibrillation after recent  successful cardioversion. She continues to show signs of heart failure and rate has been difficult to control. Thus, I recommend proceeding with repeat cardioversion given that her INR continues to be therapeutic. I am hoping that the chance of keeping her in sinus rhythm is higher now given that she has been on amiodarone for a week. Continue anticoagulation with warfarin given her high CHADs VASc score.   2. Acute on chronic systolic heart failure: Most recent ejection fraction was 25-30% which has dropped from prior of 35-40%. Continue treatment with metoprolol and lisinopril. She appears to be volume overloaded I increased the dose of furosemide to 40 mg once daily. The plan is to repeat echocardiogram once she stays in sinus rhythm for at least 3 months to reevaluate the EF. If EF does not go back to her baseline, I will plan on proceeding with cardiac catheterization.  3. Essential hypertension: Blood pressure is Controlled.  4. Coronary artery disease involving native coronary arteries without angina: Continue medical therapy.   5. Hyperlipidemia: Continue treatment with atorvastatin. She will require follow-up lipid and liver profile.   Disposition:   FU with me in 1 months  Signed,  Kathlyn Sacramento, MD  08/03/2016 2:32 PM    [place]

## 2016-08-08 ENCOUNTER — Ambulatory Visit: Payer: Medicare Other | Admitting: Family

## 2016-08-08 NOTE — Telephone Encounter (Signed)
Left detailed message on pt's cell VM regarding need for labs tomorrow at the outpatient lab. Requested call back.

## 2016-08-09 ENCOUNTER — Ambulatory Visit (INDEPENDENT_AMBULATORY_CARE_PROVIDER_SITE_OTHER): Payer: Medicare Other | Admitting: *Deleted

## 2016-08-09 ENCOUNTER — Other Ambulatory Visit: Payer: Self-pay

## 2016-08-09 ENCOUNTER — Other Ambulatory Visit (INDEPENDENT_AMBULATORY_CARE_PROVIDER_SITE_OTHER): Payer: Medicare Other

## 2016-08-09 DIAGNOSIS — I5022 Chronic systolic (congestive) heart failure: Secondary | ICD-10-CM

## 2016-08-09 DIAGNOSIS — Z5181 Encounter for therapeutic drug level monitoring: Secondary | ICD-10-CM

## 2016-08-09 DIAGNOSIS — I481 Persistent atrial fibrillation: Secondary | ICD-10-CM

## 2016-08-09 DIAGNOSIS — I4891 Unspecified atrial fibrillation: Secondary | ICD-10-CM | POA: Diagnosis not present

## 2016-08-09 DIAGNOSIS — I4819 Other persistent atrial fibrillation: Secondary | ICD-10-CM

## 2016-08-09 LAB — POCT INR: INR: 1.8

## 2016-08-09 NOTE — Telephone Encounter (Signed)
Pt in office today for coumadin check.  BMET drawn at this time.

## 2016-08-10 ENCOUNTER — Ambulatory Visit: Payer: Medicare Other | Admitting: Cardiovascular Disease

## 2016-08-10 LAB — BASIC METABOLIC PANEL
BUN/Creatinine Ratio: 16 (ref 12–28)
BUN: 19 mg/dL (ref 8–27)
CALCIUM: 8.5 mg/dL — AB (ref 8.7–10.3)
CHLORIDE: 95 mmol/L — AB (ref 96–106)
CO2: 28 mmol/L (ref 18–29)
Creatinine, Ser: 1.22 mg/dL — ABNORMAL HIGH (ref 0.57–1.00)
GFR calc Af Amer: 53 mL/min/{1.73_m2} — ABNORMAL LOW (ref >59)
GFR calc non Af Amer: 46 mL/min/{1.73_m2} — ABNORMAL LOW (ref >59)
GLUCOSE: 106 mg/dL — AB (ref 65–99)
Potassium: 3.9 mmol/L (ref 3.5–5.2)
Sodium: 141 mmol/L (ref 134–144)

## 2016-08-17 ENCOUNTER — Ambulatory Visit (INDEPENDENT_AMBULATORY_CARE_PROVIDER_SITE_OTHER): Payer: Medicare Other | Admitting: Cardiovascular Disease

## 2016-08-17 ENCOUNTER — Encounter: Payer: Self-pay | Admitting: Cardiovascular Disease

## 2016-08-17 ENCOUNTER — Ambulatory Visit (INDEPENDENT_AMBULATORY_CARE_PROVIDER_SITE_OTHER): Payer: Medicare Other

## 2016-08-17 VITALS — BP 120/86 | HR 109 | Ht 67.0 in | Wt 246.0 lb

## 2016-08-17 DIAGNOSIS — I4891 Unspecified atrial fibrillation: Secondary | ICD-10-CM

## 2016-08-17 DIAGNOSIS — I10 Essential (primary) hypertension: Secondary | ICD-10-CM | POA: Diagnosis not present

## 2016-08-17 DIAGNOSIS — I4819 Other persistent atrial fibrillation: Secondary | ICD-10-CM

## 2016-08-17 DIAGNOSIS — I2510 Atherosclerotic heart disease of native coronary artery without angina pectoris: Secondary | ICD-10-CM | POA: Diagnosis not present

## 2016-08-17 DIAGNOSIS — I5022 Chronic systolic (congestive) heart failure: Secondary | ICD-10-CM

## 2016-08-17 DIAGNOSIS — I481 Persistent atrial fibrillation: Secondary | ICD-10-CM

## 2016-08-17 DIAGNOSIS — Z5181 Encounter for therapeutic drug level monitoring: Secondary | ICD-10-CM | POA: Diagnosis not present

## 2016-08-17 LAB — POCT INR: INR: 3

## 2016-08-17 MED ORDER — FUROSEMIDE 40 MG PO TABS: 40.0000 mg | ORAL_TABLET | Freq: Two times a day (BID) | ORAL | 5 refills | Status: DC

## 2016-08-17 MED ORDER — METOPROLOL SUCCINATE ER 100 MG PO TB24: 100.0000 mg | ORAL_TABLET | Freq: Every day | ORAL | 2 refills | Status: DC

## 2016-08-17 MED ORDER — LISINOPRIL 10 MG PO TABS: 10.0000 mg | ORAL_TABLET | Freq: Every day | ORAL | 3 refills | Status: DC

## 2016-08-17 MED ORDER — POTASSIUM CHLORIDE CRYS ER 20 MEQ PO TBCR: 20.0000 meq | EXTENDED_RELEASE_TABLET | Freq: Every day | ORAL | 5 refills | Status: DC

## 2016-08-17 NOTE — Patient Instructions (Addendum)
Medication Instructions:  Your physician has recommended you make the following change in your medication:  INCREASE lasix to 40mg  twice daily START taking potassium 6mEq once daily INCREASE metoprolol to 100mg  once daily DECREASE lisinopril to 10mg  once daily    Labwork: BMET in one week at the [facility] outpatient lab. You do not need an appt.   Testing/Procedures: none  Follow-Up: Your physician recommends that you schedule a follow-up appointment in: 1 month with Dr. Fletcher Anon.    Any Other Special Instructions Will Be Listed Below (If Applicable).     If you need a refill on your cardiac medications before your next appointment, please call your pharmacy.

## 2016-08-17 NOTE — Progress Notes (Signed)
Cardiology Office Note   Date:  08/17/2016   ID:  Meghan [name], DOB [birth year], MRN [id number]  PCP:  Wellington Hampshire, MD  Cardiologist:   Kathlyn Sacramento, MD  Endocrinologist: Dr. Atha Starks  Chief Complaint  Patient presents with  . other    2 week follow up. Meds reviewed by the pt. verbally. Pt. c/o shortness of breath.       History of Present Illness: Meghan [name] is a 66 y.o. female who presents for a followup visit regarding Chronic systolic heart failure, coronary artery disease and persistent atrial fibrillation. Other medical problems include hypertension, 2 diabetes, hyperlipidemia and carotid artery disease status post left carotid endarterectomy with previous CVA.  She was evaluated in 2013 initially in the setting of acute CVA. A 2D echo was performed revealing EF 45-50% and anterior HK, severe apical HK, impaired LV relaxation, mild LA dilatation noted at that time. She subsequently underwent Lexiscan Myoview confirming prior anterior infarct, no evidence of ischemia. EF 41%. She was treated medically with ASA, BB, statin. She followed up 04/2012. No anginal or CHF type symptoms. Declined outpatient cath.  She had atrial fibrillation in the setting of acute cholecystitis in March 2015. Echocardiogram while hospitalized showed an ejection fraction of 35-40%.   She presented last month with heart failure symptoms in the setting of atrial fibrillation with rapid ventricular response. She was hospitalized at [facility] and improved with IV diuresis. She underwent an echocardiogram which showed a drop in LV systolic function with an EF of 25-30% with severe hypokinesis of the mid apical anteroseptal, anterior and apical myocardium. The patient was started on amiodarone and underwent successful cardioversion to normal sinus rhythm.  She was noted to be back in atrial fibrillation upon clinic follow-up. Thus, I scheduled a repeat cardioversion given that her INR was therapeutic. She  was noted to be in sinus rhythm on the day of cardioversion. She was volume overloaded I increased furosemide to 40 mg twice daily for 3 days and then back to 40 mg once daily. She reports improvement in swelling but she continues to have shortness of breath and leg edema. No chest pain.  Past Medical History:  Diagnosis Date  . Cardiomyopathy ([place])    a. 06/2013 Echo: EF 35-40%, glob HK, mildly dil LA, mild LVH, mild TR, mild to mod MR.  . Carotid arterial disease ([place])    a. 02/2012 s/p L CEA.  . Coronary artery disease    a. Presumed - 02/2012 Lexi MV: Anterior, anteroseptal, septal, apical infarct with mild peri-infarct ischemia, EF 41%- medically managed.  . 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 Hyperlipidemia   . Hypertension   . Morbid obesity ([place])   . Stroke [facility]) 2013    Past Surgical History:  Procedure Laterality Date  . CARDIOVERSION N/A 07/28/2016   Procedure: CARDIOVERSION;  Surgeon: Wellington Hampshire, MD;  Location: [facility] ORS;  Service: Cardiovascular;  Laterality: N/A;  . CARDIOVERSION N/A 08/07/2016   Procedure: CARDIOVERSION;  Surgeon: Wellington Hampshire, MD;  Location: [facility] ORS;  Service: Cardiovascular;  Laterality: N/A;  . CAROTID ENDARTERECTOMY     [facility]; Dr. Lucky Cowboy  . CHOLECYSTECTOMY    . LEG SURGERY     right;infection     Current Outpatient Prescriptions  Medication Sig Dispense Refill  . amiodarone (PACERONE) 200 MG tablet Take 1 tablet (200 mg total) by mouth 2 (two) times daily. 60 tablet 1  . atorvastatin (LIPITOR) 10  MG tablet TAKE ONE TABLET BY MOUTH ONCE DAILY 90 tablet 3  . furosemide (LASIX) 40 MG tablet Take 1 tablet (40 mg total) by mouth daily. 30 tablet 3  . glipiZIDE (GLUCOTROL) 10 MG tablet Take 10 mg by mouth 2 (two) times daily.    Marland Kitchen lisinopril (PRINIVIL,ZESTRIL) 10 MG tablet TAKE ONE TABLET BY MOUTH ONCE DAILY 90 tablet 3  . metFORMIN (GLUCOPHAGE) 500 MG tablet Take 500 mg by mouth 2 (two) times daily.     . metoprolol  succinate (TOPROL-XL) 50 MG 24 hr tablet Take 1 tablet (50 mg total) by mouth daily. Take with or immediately following a meal. 30 tablet 1  . warfarin (COUMADIN) 5 MG tablet Take 1 tablet (5 mg total) by mouth one time only at 6 PM. Start from Sunday April 22nd 30 tablet 0   No current facility-administered medications for this visit.     Allergies:   Patient has no known allergies.    Social History:  The patient  reports that she has quit smoking. Her smoking use included Cigarettes. She has a 2.50 pack-year smoking history. She has never used smokeless tobacco. She reports that she does not drink alcohol or use drugs.   Family History:  The patient's family history includes Heart disease in her mother.    ROS:  Please see the history of present illness.   Otherwise, review of systems are positive for none.   All other systems are reviewed and negative.    PHYSICAL EXAM: VS:  BP 120/86 (BP Location: Right Arm, Patient Position: Sitting, Cuff Size: Normal)   Pulse (!) 109   Ht 5\' 7"  (1.702 m)   Wt 246 lb (111.6 kg)   BMI 38.53 kg/m  , BMI Body mass index is 38.53 kg/m. GEN: Well nourished, well developed, in no acute distress  HEENT: normal  Neck: Moderate JVD, carotid bruits, or masses Cardiac: Irregularly irregular; no murmurs, rubs, or gallops, mild to moderate bilateral leg edema  Respiratory:  Bibasilar crackles, normal work of breathing GI: soft, nontender, nondistended, + BS MS: no deformity or atrophy  Skin: warm and dry, no rash Neuro:  Strength and sensation are intact Psych: euthymic mood, full affect   EKG:  EKG is ordered today. The ekg ordered today demonstrates normal  atrial fibrillation with rapid ventricular response with old anteroseptal infarct.   Recent Labs: 07/26/2016: ALT 13; B Natriuretic Peptide 1,062.0; TSH 5.260 07/27/2016: Hemoglobin 9.0; Platelets 316 08/09/2016: BUN 19; Creatinine, Ser 1.22; Potassium 3.9; Sodium 141    Lipid Panel      Component Value Date/Time   CHOL 165 02/14/2012 0410   TRIG 168 02/14/2012 0410   HDL 31 (L) 02/14/2012 0410   VLDL 34 02/14/2012 0410   LDLCALC 100 02/14/2012 0410      Wt Readings from Last 3 Encounters:  08/17/16 246 lb (111.6 kg)  08/07/16 249 lb (112.9 kg)  08/03/16 249 lb 8 oz (113.2 kg)       ASSESSMENT AND PLAN:  1.   Persistent atrial fibrillation: Unfortunately, she is going in and out of atrial fibrillation. I'm going to increase Toprol to 100 mg once daily for better rate control. Continue amiodarone for now with plans for cardioversion in one month if she remains in atrial fibrillation. Continue anticoagulation with warfarin.  2. Acute on chronic systolic heart failure: Most recent ejection fraction was 25-30% which has dropped from prior of 35-40%. Continue treatment with metoprolol and lisinopril.  She continues to  be volume overloaded. I increased furosemide to 40 mg twice daily. I added small dose potassium and decrease lisinopril to 5 mg once daily in order to allow titration of Toprol. The plan is to repeat echocardiogram once she stays in sinus rhythm for at least 3 months to reevaluate the EF. If EF does not go back to her baseline, I will plan on proceeding with cardiac catheterization.  3. Essential hypertension: Blood pressure is Controlled.  4. Coronary artery disease involving native coronary arteries without angina: Continue medical therapy.   5. Hyperlipidemia: Continue treatment with atorvastatin. She will require follow-up lipid and liver profile in the near future.   Disposition:   FU with me in 1 months  Signed,  Kathlyn Sacramento, MD  08/17/2016 11:42 AM    [place]

## 2016-08-18 ENCOUNTER — Telehealth: Payer: Self-pay | Admitting: Cardiovascular Disease

## 2016-08-18 NOTE — Telephone Encounter (Signed)
°*  STAT* If patient is at the pharmacy, call can be transferred to refill team.   1. Which medications need to be refilled? (please list name of each medication and dose if known) potassium 20 mg   2. Which pharmacy/location (including street and city if local pharmacy) is medication to be sent to? [facility] in [place]   3. Do they need a 30 day or 90 day supply? 90 day

## 2016-08-21 MED ORDER — POTASSIUM CHLORIDE CRYS ER 20 MEQ PO TBCR: 20.0000 meq | EXTENDED_RELEASE_TABLET | Freq: Every day | ORAL | 6 refills | Status: DC

## 2016-08-21 NOTE — Telephone Encounter (Signed)
Refill sent for potassium 20 mg one tablet daily.

## 2016-08-23 ENCOUNTER — Ambulatory Visit (INDEPENDENT_AMBULATORY_CARE_PROVIDER_SITE_OTHER): Payer: Medicare Other | Admitting: *Deleted

## 2016-08-23 ENCOUNTER — Other Ambulatory Visit: Payer: Self-pay | Admitting: Cardiovascular Disease

## 2016-08-23 ENCOUNTER — Other Ambulatory Visit: Payer: Medicare Other

## 2016-08-23 DIAGNOSIS — I4891 Unspecified atrial fibrillation: Secondary | ICD-10-CM | POA: Diagnosis not present

## 2016-08-23 DIAGNOSIS — Z5181 Encounter for therapeutic drug level monitoring: Secondary | ICD-10-CM

## 2016-08-23 DIAGNOSIS — I4819 Other persistent atrial fibrillation: Secondary | ICD-10-CM

## 2016-08-23 DIAGNOSIS — I2510 Atherosclerotic heart disease of native coronary artery without angina pectoris: Secondary | ICD-10-CM

## 2016-08-23 DIAGNOSIS — I481 Persistent atrial fibrillation: Secondary | ICD-10-CM

## 2016-08-23 DIAGNOSIS — I5022 Chronic systolic (congestive) heart failure: Secondary | ICD-10-CM | POA: Diagnosis not present

## 2016-08-23 LAB — POCT INR: INR: 2.6

## 2016-08-24 LAB — BASIC METABOLIC PANEL
BUN/Creatinine Ratio: 17 (ref 12–28)
BUN: 27 mg/dL (ref 8–27)
CALCIUM: 8.6 mg/dL — AB (ref 8.7–10.3)
CHLORIDE: 94 mmol/L — AB (ref 96–106)
CO2: 27 mmol/L (ref 18–29)
Creatinine, Ser: 1.59 mg/dL — ABNORMAL HIGH (ref 0.57–1.00)
GFR calc Af Amer: 38 mL/min/{1.73_m2} — ABNORMAL LOW (ref >59)
GFR calc non Af Amer: 33 mL/min/{1.73_m2} — ABNORMAL LOW (ref >59)
GLUCOSE: 163 mg/dL — AB (ref 65–99)
Potassium: 3.8 mmol/L (ref 3.5–5.2)
Sodium: 138 mmol/L (ref 134–144)

## 2016-08-25 ENCOUNTER — Other Ambulatory Visit: Payer: Self-pay

## 2016-08-25 ENCOUNTER — Telehealth: Payer: Self-pay | Admitting: Cardiovascular Disease

## 2016-08-25 MED ORDER — FUROSEMIDE 40 MG PO TABS: 40.0000 mg | ORAL_TABLET | Freq: Every day | ORAL | 5 refills | Status: DC

## 2016-08-25 MED ORDER — WARFARIN SODIUM 5 MG PO TABS: 5.0000 mg | ORAL_TABLET | Freq: Once | ORAL | 0 refills | Status: DC

## 2016-08-25 NOTE — Telephone Encounter (Signed)
Pt is returning our call regarding her lab results.

## 2016-08-25 NOTE — Telephone Encounter (Signed)
Returned call to pt. See Lab Results note

## 2016-08-25 NOTE — Telephone Encounter (Signed)
Pt requesting coumadin refill. Per Fuller Canada, RPH, may refill. Coumadin 5mg  #30  sent to [place]

## 2016-09-06 ENCOUNTER — Ambulatory Visit (INDEPENDENT_AMBULATORY_CARE_PROVIDER_SITE_OTHER): Payer: Medicare Other

## 2016-09-06 DIAGNOSIS — I4891 Unspecified atrial fibrillation: Secondary | ICD-10-CM | POA: Diagnosis not present

## 2016-09-06 DIAGNOSIS — I2510 Atherosclerotic heart disease of native coronary artery without angina pectoris: Secondary | ICD-10-CM

## 2016-09-06 DIAGNOSIS — Z5181 Encounter for therapeutic drug level monitoring: Secondary | ICD-10-CM

## 2016-09-06 LAB — POCT INR: INR: 2.4

## 2016-09-11 DIAGNOSIS — L97522 Non-pressure chronic ulcer of other part of left foot with fat layer exposed: Secondary | ICD-10-CM | POA: Diagnosis not present

## 2016-09-11 DIAGNOSIS — E1142 Type 2 diabetes mellitus with diabetic polyneuropathy: Secondary | ICD-10-CM | POA: Diagnosis not present

## 2016-09-11 DIAGNOSIS — Z8631 Personal history of diabetic foot ulcer: Secondary | ICD-10-CM | POA: Diagnosis not present

## 2016-09-11 DIAGNOSIS — E11621 Type 2 diabetes mellitus with foot ulcer: Secondary | ICD-10-CM | POA: Diagnosis not present

## 2016-09-11 DIAGNOSIS — L97509 Non-pressure chronic ulcer of other part of unspecified foot with unspecified severity: Secondary | ICD-10-CM | POA: Diagnosis not present

## 2016-09-18 ENCOUNTER — Ambulatory Visit (INDEPENDENT_AMBULATORY_CARE_PROVIDER_SITE_OTHER): Payer: Medicare Other | Admitting: Cardiovascular Disease

## 2016-09-18 ENCOUNTER — Encounter: Payer: Self-pay | Admitting: Cardiovascular Disease

## 2016-09-18 VITALS — BP 130/70 | HR 66 | Ht 67.0 in | Wt 249.0 lb

## 2016-09-18 DIAGNOSIS — I5022 Chronic systolic (congestive) heart failure: Secondary | ICD-10-CM

## 2016-09-18 DIAGNOSIS — I481 Persistent atrial fibrillation: Secondary | ICD-10-CM | POA: Diagnosis not present

## 2016-09-18 DIAGNOSIS — I2510 Atherosclerotic heart disease of native coronary artery without angina pectoris: Secondary | ICD-10-CM | POA: Diagnosis not present

## 2016-09-18 DIAGNOSIS — I10 Essential (primary) hypertension: Secondary | ICD-10-CM

## 2016-09-18 DIAGNOSIS — E785 Hyperlipidemia, unspecified: Secondary | ICD-10-CM | POA: Diagnosis not present

## 2016-09-18 DIAGNOSIS — I4819 Other persistent atrial fibrillation: Secondary | ICD-10-CM

## 2016-09-18 MED ORDER — SPIRONOLACTONE 25 MG PO TABS: 25.0000 mg | ORAL_TABLET | Freq: Every day | ORAL | 3 refills | Status: DC

## 2016-09-18 MED ORDER — AMIODARONE HCL 200 MG PO TABS: 200.0000 mg | ORAL_TABLET | Freq: Every day | ORAL | 3 refills | Status: DC

## 2016-09-18 NOTE — Patient Instructions (Signed)
Medication Instructions:  Your physician has recommended you make the following change in your medication:  DECREASE amiodarone to 200mg  once daily STOP taking potassium START taking spironolactone 25mg  once daily   Labwork: BMET today  Testing/Procedures: none  Follow-Up: Your physician recommends that you schedule a follow-up appointment in: one month with Dr. Fletcher Anon.    Any Other Special Instructions Will Be Listed Below (If Applicable).     If you need a refill on your cardiac medications before your next appointment, please call your pharmacy.

## 2016-09-18 NOTE — Progress Notes (Signed)
Cardiology Office Note   Date:  09/18/2016   ID:  Meghan [name], DOB 1950-10-02, MRN [id number]  PCP:  Patient, No Pcp Per  Cardiologist:   Kathlyn Sacramento, MD  Endocrinologist: Dr. Atha Starks  Chief Complaint  Patient presents with  . other    1 month follow up. Meds reviewed by the pt's daughter verbally. "doing well."       History of Present Illness: Meghan [name] is a 66 y.o. female who presents for a followup visit regarding Chronic systolic heart failure, coronary artery disease and persistent atrial fibrillation. Other medical problems include hypertension, 2 diabetes, hyperlipidemia and carotid artery disease status post left carotid endarterectomy with previous CVA. She never had cardiac cath in the past. She had worsening heart failure in the setting of atrial fibrillation with rapid ventricular response. She is maintaining in sinus rhythm with amiodarone. She reports significant improvement in shortness of breath and leg edema. No palpitations. She is having diabetic foot ulcers and being treated by podiatry. Distal pulses are palpable.  Past Medical History:  Diagnosis Date  . Cardiomyopathy ([place])    a. 06/2013 Echo: EF 35-40%, glob HK, mildly dil LA, mild LVH, mild TR, mild to mod MR.  . Carotid arterial disease ([place])    a. 02/2012 s/p L CEA.  . Coronary artery disease    a. Presumed - 02/2012 Lexi MV: Anterior, anteroseptal, septal, apical infarct with mild peri-infarct ischemia, EF 41%- medically managed.  . 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 Hyperlipidemia   . Hypertension   . Morbid obesity ([place])   . Stroke [facility]) 2013    Past Surgical History:  Procedure Laterality Date  . CARDIOVERSION N/A 07/28/2016   Procedure: CARDIOVERSION;  Surgeon: Wellington Hampshire, MD;  Location: [facility] ORS;  Service: Cardiovascular;  Laterality: N/A;  . CARDIOVERSION N/A 08/07/2016   Procedure: CARDIOVERSION;  Surgeon: Wellington Hampshire, MD;  Location: [facility]  ORS;  Service: Cardiovascular;  Laterality: N/A;  . CAROTID ENDARTERECTOMY     [facility]; Dr. Lucky Cowboy  . CHOLECYSTECTOMY    . LEG SURGERY     right;infection     Current Outpatient Prescriptions  Medication Sig Dispense Refill  . amiodarone (PACERONE) 200 MG tablet Take 1 tablet (200 mg total) by mouth 2 (two) times daily. 60 tablet 1  . atorvastatin (LIPITOR) 10 MG tablet TAKE ONE TABLET BY MOUTH ONCE DAILY 90 tablet 3  . furosemide (LASIX) 40 MG tablet Take 1 tablet (40 mg total) by mouth daily. 30 tablet 5  . glipiZIDE (GLUCOTROL) 10 MG tablet Take 10 mg by mouth 2 (two) times daily.    Marland Kitchen lisinopril (PRINIVIL,ZESTRIL) 10 MG tablet Take 1 tablet (10 mg total) by mouth daily. 90 tablet 3  . metFORMIN (GLUCOPHAGE) 500 MG tablet Take 500 mg by mouth 2 (two) times daily.     . metoprolol succinate (TOPROL-XL) 100 MG 24 hr tablet Take 1 tablet (100 mg total) by mouth daily. Take with or immediately following a meal. 90 tablet 2  . potassium chloride SA (K-DUR,KLOR-CON) 20 MEQ tablet Take 1 tablet (20 mEq total) by mouth daily. 30 tablet 6  . warfarin (COUMADIN) 5 MG tablet Take 1 tablet (5 mg total) by mouth one time only at 6 PM. Start from Sunday April 22nd 30 tablet 0   No current facility-administered medications for this visit.     Allergies:   Patient has no known allergies.    Social History:  The patient  reports that she has quit smoking. Her smoking use included Cigarettes. She has a 2.50 pack-year smoking history. She has never used smokeless tobacco. She reports that she does not drink alcohol or use drugs.   Family History:  The patient's family history includes Heart disease in her mother.    ROS:  Please see the history of present illness.   Otherwise, review of systems are positive for none.   All other systems are reviewed and negative.    PHYSICAL EXAM: VS:  BP 130/70 (BP Location: Left Arm, Patient Position: Sitting, Cuff Size: Normal)   Pulse 66   Ht 5\' 7"  (1.702 m)    Wt 249 lb (112.9 kg)   BMI 39.00 kg/m  , BMI Body mass index is 39 kg/m. GEN: Well nourished, well developed, in no acute distress  HEENT: normal  Neck: No JVD, carotid bruits, or masses Cardiac:RRR; no murmurs, rubs, or gallops, trace bilateral leg edema  Respiratory:  Bibasilar crackles, normal work of breathing GI: soft, nontender, nondistended, + BS MS: no deformity or atrophy  Skin: warm and dry, no rash Neuro:  Strength and sensation are intact Psych: euthymic mood, full affect   EKG:  EKG is ordered today. The ekg ordered today demonstrates normal sinus rhythm with first-degree AV block. Possible old septal infarct.  Recent Labs: 07/26/2016: ALT 13; B Natriuretic Peptide 1,062.0; TSH 5.260 07/27/2016: Hemoglobin 9.0; Platelets 316 08/23/2016: BUN 27; Creatinine, Ser 1.59; Potassium 3.8; Sodium 138    Lipid Panel    Component Value Date/Time   CHOL 165 02/14/2012 0410   TRIG 168 02/14/2012 0410   HDL 31 (L) 02/14/2012 0410   VLDL 34 02/14/2012 0410   LDLCALC 100 02/14/2012 0410      Wt Readings from Last 3 Encounters:  09/18/16 249 lb (112.9 kg)  08/17/16 246 lb (111.6 kg)  08/07/16 249 lb (112.9 kg)       ASSESSMENT AND PLAN:  1.   Persistent atrial fibrillation: She is back in sinus rhythm after increasing Toprol last month and continuing amiodarone. I decreased the amiodarone to 200 mg once daily. Continue anticoagulation with warfarin with a target INR between 2 and 3.  2. chronic systolic heart failure: Most recent ejection fraction was 25-30% which has dropped from prior of 35-40%. Continue treatment with metoprolol and lisinopril. She appears to be euvolemic on current dose of furosemide. I added spironolactone 25 mg once daily and discontinued potassium. Check basic metabolic profile today. The plan is to repeat echocardiogram in few months once she is optimized in maintaining sinus rhythm. If ejection fraction does not return to her baseline of 35-40%, I  plan on proceeding with cardiac cath.  3. Essential hypertension: Blood pressure is Controlled.  4. Coronary artery disease involving native coronary arteries without angina: Continue medical therapy.   5. Hyperlipidemia: Continue treatment with atorvastatin. She will require follow-up lipid and liver profile in the near future.   Disposition:   FU with me in 1 months  Signed,  Kathlyn Sacramento, MD  09/18/2016 2:31 PM    [place]

## 2016-09-19 LAB — BASIC METABOLIC PANEL
BUN/Creatinine Ratio: 16 (ref 12–28)
BUN: 18 mg/dL (ref 8–27)
CALCIUM: 8.4 mg/dL — AB (ref 8.7–10.3)
CHLORIDE: 97 mmol/L (ref 96–106)
CO2: 22 mmol/L (ref 20–29)
Creatinine, Ser: 1.16 mg/dL — ABNORMAL HIGH (ref 0.57–1.00)
GFR calc non Af Amer: 48 mL/min/{1.73_m2} — ABNORMAL LOW (ref >59)
GFR, EST AFRICAN AMERICAN: 56 mL/min/{1.73_m2} — AB (ref >59)
Glucose: 123 mg/dL — ABNORMAL HIGH (ref 65–99)
POTASSIUM: 3.7 mmol/L (ref 3.5–5.2)
SODIUM: 137 mmol/L (ref 134–144)

## 2016-09-20 ENCOUNTER — Other Ambulatory Visit: Payer: Self-pay

## 2016-09-20 DIAGNOSIS — I5022 Chronic systolic (congestive) heart failure: Secondary | ICD-10-CM

## 2016-09-21 ENCOUNTER — Inpatient Hospital Stay
Admission: EM | Admit: 2016-09-21 | Discharge: 2016-09-24 | DRG: 617 | Disposition: A | Discharge status: Home Health | Payer: Medicare Other | Attending: Internal Medicine | Admitting: Internal Medicine

## 2016-09-21 ENCOUNTER — Encounter: Payer: Self-pay | Admitting: Emergency Medicine

## 2016-09-21 ENCOUNTER — Emergency Department: Payer: Medicare Other

## 2016-09-21 ENCOUNTER — Inpatient Hospital Stay: Payer: Medicare Other

## 2016-09-21 DIAGNOSIS — Z01810 Encounter for preprocedural cardiovascular examination: Secondary | ICD-10-CM | POA: Diagnosis not present

## 2016-09-21 DIAGNOSIS — I130 Hypertensive heart and chronic kidney disease with heart failure and stage 1 through stage 4 chronic kidney disease, or unspecified chronic kidney disease: Secondary | ICD-10-CM | POA: Diagnosis present

## 2016-09-21 DIAGNOSIS — E1142 Type 2 diabetes mellitus with diabetic polyneuropathy: Secondary | ICD-10-CM | POA: Diagnosis present

## 2016-09-21 DIAGNOSIS — I4819 Other persistent atrial fibrillation: Secondary | ICD-10-CM | POA: Diagnosis present

## 2016-09-21 DIAGNOSIS — D649 Anemia, unspecified: Secondary | ICD-10-CM | POA: Diagnosis not present

## 2016-09-21 DIAGNOSIS — M86172 Other acute osteomyelitis, left ankle and foot: Secondary | ICD-10-CM | POA: Diagnosis not present

## 2016-09-21 DIAGNOSIS — L089 Local infection of the skin and subcutaneous tissue, unspecified: Secondary | ICD-10-CM | POA: Diagnosis not present

## 2016-09-21 DIAGNOSIS — Z6838 Body mass index (BMI) 38.0-38.9, adult: Secondary | ICD-10-CM

## 2016-09-21 DIAGNOSIS — E1151 Type 2 diabetes mellitus with diabetic peripheral angiopathy without gangrene: Secondary | ICD-10-CM | POA: Diagnosis present

## 2016-09-21 DIAGNOSIS — I255 Ischemic cardiomyopathy: Secondary | ICD-10-CM | POA: Diagnosis present

## 2016-09-21 DIAGNOSIS — E1165 Type 2 diabetes mellitus with hyperglycemia: Secondary | ICD-10-CM | POA: Diagnosis present

## 2016-09-21 DIAGNOSIS — E119 Type 2 diabetes mellitus without complications: Secondary | ICD-10-CM | POA: Diagnosis not present

## 2016-09-21 DIAGNOSIS — Z87891 Personal history of nicotine dependence: Secondary | ICD-10-CM

## 2016-09-21 DIAGNOSIS — I429 Cardiomyopathy, unspecified: Secondary | ICD-10-CM | POA: Diagnosis present

## 2016-09-21 DIAGNOSIS — L97529 Non-pressure chronic ulcer of other part of left foot with unspecified severity: Secondary | ICD-10-CM | POA: Diagnosis present

## 2016-09-21 DIAGNOSIS — Z8249 Family history of ischemic heart disease and other diseases of the circulatory system: Secondary | ICD-10-CM

## 2016-09-21 DIAGNOSIS — N183 Chronic kidney disease, stage 3 unspecified: Secondary | ICD-10-CM | POA: Diagnosis present

## 2016-09-21 DIAGNOSIS — I480 Paroxysmal atrial fibrillation: Secondary | ICD-10-CM | POA: Diagnosis present

## 2016-09-21 DIAGNOSIS — M86272 Subacute osteomyelitis, left ankle and foot: Secondary | ICD-10-CM | POA: Diagnosis not present

## 2016-09-21 DIAGNOSIS — E876 Hypokalemia: Secondary | ICD-10-CM

## 2016-09-21 DIAGNOSIS — E871 Hypo-osmolality and hyponatremia: Secondary | ICD-10-CM | POA: Diagnosis present

## 2016-09-21 DIAGNOSIS — D638 Anemia in other chronic diseases classified elsewhere: Secondary | ICD-10-CM | POA: Diagnosis present

## 2016-09-21 DIAGNOSIS — E1169 Type 2 diabetes mellitus with other specified complication: Secondary | ICD-10-CM | POA: Diagnosis present

## 2016-09-21 DIAGNOSIS — D508 Other iron deficiency anemias: Secondary | ICD-10-CM | POA: Diagnosis not present

## 2016-09-21 DIAGNOSIS — Z7901 Long term (current) use of anticoagulants: Secondary | ICD-10-CM

## 2016-09-21 DIAGNOSIS — D509 Iron deficiency anemia, unspecified: Secondary | ICD-10-CM | POA: Diagnosis present

## 2016-09-21 DIAGNOSIS — L03032 Cellulitis of left toe: Secondary | ICD-10-CM | POA: Diagnosis present

## 2016-09-21 DIAGNOSIS — I2510 Atherosclerotic heart disease of native coronary artery without angina pectoris: Secondary | ICD-10-CM | POA: Diagnosis present

## 2016-09-21 DIAGNOSIS — E11621 Type 2 diabetes mellitus with foot ulcer: Secondary | ICD-10-CM | POA: Diagnosis present

## 2016-09-21 DIAGNOSIS — Z7984 Long term (current) use of oral hypoglycemic drugs: Secondary | ICD-10-CM

## 2016-09-21 DIAGNOSIS — M7989 Other specified soft tissue disorders: Secondary | ICD-10-CM | POA: Diagnosis not present

## 2016-09-21 DIAGNOSIS — I110 Hypertensive heart disease with heart failure: Secondary | ICD-10-CM | POA: Diagnosis not present

## 2016-09-21 DIAGNOSIS — E785 Hyperlipidemia, unspecified: Secondary | ICD-10-CM | POA: Diagnosis present

## 2016-09-21 DIAGNOSIS — I5042 Chronic combined systolic (congestive) and diastolic (congestive) heart failure: Secondary | ICD-10-CM | POA: Diagnosis present

## 2016-09-21 DIAGNOSIS — M869 Osteomyelitis, unspecified: Secondary | ICD-10-CM | POA: Diagnosis not present

## 2016-09-21 DIAGNOSIS — E1122 Type 2 diabetes mellitus with diabetic chronic kidney disease: Secondary | ICD-10-CM | POA: Diagnosis present

## 2016-09-21 DIAGNOSIS — Z79899 Other long term (current) drug therapy: Secondary | ICD-10-CM

## 2016-09-21 DIAGNOSIS — I509 Heart failure, unspecified: Secondary | ICD-10-CM | POA: Diagnosis not present

## 2016-09-21 DIAGNOSIS — I5022 Chronic systolic (congestive) heart failure: Secondary | ICD-10-CM | POA: Diagnosis present

## 2016-09-21 DIAGNOSIS — Z9049 Acquired absence of other specified parts of digestive tract: Secondary | ICD-10-CM

## 2016-09-21 DIAGNOSIS — I481 Persistent atrial fibrillation: Secondary | ICD-10-CM | POA: Diagnosis not present

## 2016-09-21 DIAGNOSIS — Z8673 Personal history of transient ischemic attack (TIA), and cerebral infarction without residual deficits: Secondary | ICD-10-CM

## 2016-09-21 DIAGNOSIS — Z8631 Personal history of diabetic foot ulcer: Secondary | ICD-10-CM | POA: Diagnosis not present

## 2016-09-21 LAB — BASIC METABOLIC PANEL
Anion gap: 9 (ref 5–15)
BUN: 15 mg/dL (ref 6–20)
CO2: 27 mmol/L (ref 22–32)
CREATININE: 1.16 mg/dL — AB (ref 0.44–1.00)
Calcium: 8.2 mg/dL — ABNORMAL LOW (ref 8.9–10.3)
Chloride: 96 mmol/L — ABNORMAL LOW (ref 101–111)
GFR calc Af Amer: 55 mL/min — ABNORMAL LOW (ref >60)
GFR, EST NON AFRICAN AMERICAN: 47 mL/min — AB (ref >60)
Glucose, Bld: 142 mg/dL — ABNORMAL HIGH (ref 65–99)
Potassium: 2.8 mmol/L — ABNORMAL LOW (ref 3.5–5.1)
Sodium: 132 mmol/L — ABNORMAL LOW (ref 135–145)

## 2016-09-21 LAB — PROTIME-INR
INR: 2.58
Prothrombin Time: 28.2 seconds — ABNORMAL HIGH (ref 11.4–15.2)

## 2016-09-21 LAB — GLUCOSE, CAPILLARY: GLUCOSE-CAPILLARY: 161 mg/dL — AB (ref 65–99)

## 2016-09-21 LAB — CBC
HCT: 28.5 % — ABNORMAL LOW (ref 35.0–47.0)
Hemoglobin: 8.9 g/dL — ABNORMAL LOW (ref 12.0–16.0)
MCH: 22.5 pg — AB (ref 26.0–34.0)
MCHC: 31.2 g/dL — AB (ref 32.0–36.0)
MCV: 72.1 fL — ABNORMAL LOW (ref 80.0–100.0)
PLATELETS: 390 10*3/uL (ref 150–440)
RBC: 3.95 MIL/uL (ref 3.80–5.20)
RDW: 21.8 % — AB (ref 11.5–14.5)
WBC: 7.9 10*3/uL (ref 3.6–11.0)

## 2016-09-21 LAB — MAGNESIUM: Magnesium: 1.3 mg/dL — ABNORMAL LOW (ref 1.7–2.4)

## 2016-09-21 MED ORDER — HYDROCODONE-ACETAMINOPHEN 5-325 MG PO TABS
1.0000 | ORAL_TABLET | ORAL | Status: DC | PRN
  Administered 2016-09-21: 1 via ORAL
  Administered 2016-09-22 – 2016-09-23 (×3): 2 via ORAL
  Administered 2016-09-24: 1 via ORAL
  Filled 2016-09-21: qty 2
  Filled 2016-09-21: qty 1
  Filled 2016-09-21 (×2): qty 2
  Filled 2016-09-21: qty 1

## 2016-09-21 MED ORDER — ATORVASTATIN CALCIUM 10 MG PO TABS
10.0000 mg | ORAL_TABLET | Freq: Every day | ORAL | Status: DC
  Administered 2016-09-23 – 2016-09-24 (×2): 10 mg via ORAL
  Filled 2016-09-21 (×2): qty 1

## 2016-09-21 MED ORDER — PIPERACILLIN-TAZOBACTAM 3.375 G IVPB
3.3750 g | Freq: Three times a day (TID) | INTRAVENOUS | Status: DC
  Administered 2016-09-22 – 2016-09-24 (×8): 3.375 g via INTRAVENOUS
  Filled 2016-09-21 (×11): qty 50

## 2016-09-21 MED ORDER — ACETAMINOPHEN 325 MG PO TABS: 650.0000 mg | ORAL_TABLET | Freq: Four times a day (QID) | ORAL | Status: DC | PRN

## 2016-09-21 MED ORDER — WARFARIN SODIUM 3 MG PO TABS: 5.0000 mg | ORAL_TABLET | Freq: Once | ORAL | Status: DC

## 2016-09-21 MED ORDER — SPIRONOLACTONE 25 MG PO TABS
25.0000 mg | ORAL_TABLET | Freq: Every day | ORAL | Status: DC
  Administered 2016-09-23 – 2016-09-24 (×2): 25 mg via ORAL
  Filled 2016-09-21 (×3): qty 1

## 2016-09-21 MED ORDER — INSULIN ASPART 100 UNIT/ML ~~LOC~~ SOLN: 4.0000 [IU] | Freq: Three times a day (TID) | SUBCUTANEOUS | Status: DC

## 2016-09-21 MED ORDER — ACETAMINOPHEN 650 MG RE SUPP: 650.0000 mg | Freq: Four times a day (QID) | RECTAL | Status: DC | PRN

## 2016-09-21 MED ORDER — ENOXAPARIN SODIUM 40 MG/0.4ML ~~LOC~~ SOLN
40.0000 mg | SUBCUTANEOUS | Status: DC
  Administered 2016-09-21: 40 mg via SUBCUTANEOUS
  Filled 2016-09-21: qty 0.4

## 2016-09-21 MED ORDER — ONDANSETRON HCL 4 MG PO TABS: 4.0000 mg | ORAL_TABLET | Freq: Four times a day (QID) | ORAL | Status: DC | PRN

## 2016-09-21 MED ORDER — INSULIN ASPART 100 UNIT/ML ~~LOC~~ SOLN: 0.0000 [IU] | Freq: Every day | SUBCUTANEOUS | Status: DC

## 2016-09-21 MED ORDER — METFORMIN HCL 500 MG PO TABS: 500.0000 mg | ORAL_TABLET | Freq: Two times a day (BID) | ORAL | Status: DC

## 2016-09-21 MED ORDER — LISINOPRIL 10 MG PO TABS
10.0000 mg | ORAL_TABLET | Freq: Every day | ORAL | Status: DC
  Administered 2016-09-23 – 2016-09-24 (×2): 10 mg via ORAL
  Filled 2016-09-21 (×2): qty 1

## 2016-09-21 MED ORDER — PIPERACILLIN-TAZOBACTAM 3.375 G IVPB 30 MIN
3.3750 g | Freq: Once | INTRAVENOUS | Status: AC
  Administered 2016-09-21: 3.375 g via INTRAVENOUS
  Filled 2016-09-21: qty 50

## 2016-09-21 MED ORDER — AMIODARONE HCL 200 MG PO TABS
200.0000 mg | ORAL_TABLET | Freq: Every day | ORAL | Status: DC
  Administered 2016-09-22 – 2016-09-24 (×3): 200 mg via ORAL
  Filled 2016-09-21 (×3): qty 1

## 2016-09-21 MED ORDER — VANCOMYCIN HCL IN DEXTROSE 1-5 GM/200ML-% IV SOLN
1000.0000 mg | Freq: Once | INTRAVENOUS | Status: AC
  Administered 2016-09-21: 1000 mg via INTRAVENOUS
  Filled 2016-09-21: qty 200

## 2016-09-21 MED ORDER — ONDANSETRON HCL 4 MG/2ML IJ SOLN: 4.0000 mg | Freq: Four times a day (QID) | INTRAMUSCULAR | Status: DC | PRN

## 2016-09-21 MED ORDER — DOCUSATE SODIUM 100 MG PO CAPS
100.0000 mg | ORAL_CAPSULE | Freq: Two times a day (BID) | ORAL | Status: DC
  Administered 2016-09-21 – 2016-09-24 (×5): 100 mg via ORAL
  Filled 2016-09-21 (×5): qty 1

## 2016-09-21 MED ORDER — GLIPIZIDE 10 MG PO TABS
10.0000 mg | ORAL_TABLET | Freq: Two times a day (BID) | ORAL | Status: DC
  Administered 2016-09-21: 10 mg via ORAL
  Filled 2016-09-21: qty 1

## 2016-09-21 MED ORDER — METOPROLOL SUCCINATE ER 50 MG PO TB24: 100.0000 mg | ORAL_TABLET | Freq: Every day | ORAL | Status: DC

## 2016-09-21 MED ORDER — VANCOMYCIN HCL IN DEXTROSE 1-5 GM/200ML-% IV SOLN
1000.0000 mg | Freq: Two times a day (BID) | INTRAVENOUS | Status: DC
  Administered 2016-09-21: 1000 mg via INTRAVENOUS
  Filled 2016-09-21 (×3): qty 200

## 2016-09-21 MED ORDER — POTASSIUM CHLORIDE CRYS ER 20 MEQ PO TBCR
40.0000 meq | EXTENDED_RELEASE_TABLET | ORAL | Status: AC
  Administered 2016-09-21 – 2016-09-22 (×3): 40 meq via ORAL
  Filled 2016-09-21 (×3): qty 2

## 2016-09-21 MED ORDER — INSULIN ASPART 100 UNIT/ML ~~LOC~~ SOLN
0.0000 [IU] | Freq: Three times a day (TID) | SUBCUTANEOUS | Status: DC
  Administered 2016-09-22 – 2016-09-23 (×2): 2 [IU] via SUBCUTANEOUS
  Administered 2016-09-23: 5 [IU] via SUBCUTANEOUS
  Administered 2016-09-23: 3 [IU] via SUBCUTANEOUS
  Filled 2016-09-21 (×5): qty 1

## 2016-09-21 NOTE — ED Notes (Signed)
Pt on phone with MRI at this time.  

## 2016-09-21 NOTE — H&P (Addendum)
[place] [facility] [place] NAME: Meghan [name]    MR#:  [id number]  DATE OF BIRTH:  09-14-1950  DATE OF ADMISSION:  09/21/2016  PRIMARY CARE PHYSICIAN: Patient, No Pcp Per   REQUESTING/REFERRING PHYSICIAN:   CHIEF COMPLAINT:   Chief Complaint  Patient presents with  . Toe Pain    HISTORY OF PRESENT ILLNESS: Meghan [name]  is a 66 y.o. female with a known history of Cardiomyopathy, carotid artery disease, coronary artery disease, diabetes, hypertension, hyperlipidemia, obesity, who presents to the hospital with complaints of left great toe ulcer, worsening over the past 3 weeks, especially over the past one week. Patient apparently has been treated for great toe ulceration by Dr. Vickki Muff, however, her great toe became very swollen, erythematous and now it's draining purulent discharge. She presented emergency room for evaluation, had an x-ray, which revealed osteomyelitis and hospitalist services were contacted for admission. Patient denies any significant pain due to diabetic neuropathy/anesthesia  PAST MEDICAL HISTORY:   Past Medical History:  Diagnosis Date  . Cardiomyopathy ([place])    a. 06/2013 Echo: EF 35-40%, glob HK, mildly dil LA, mild LVH, mild TR, mild to mod MR.  . Carotid arterial disease ([place])    a. 02/2012 s/p L CEA.  . Coronary artery disease    a. Presumed - 02/2012 Lexi MV: Anterior, anteroseptal, septal, apical infarct with mild peri-infarct ischemia, EF 41%- medically managed.  . 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 Hyperlipidemia   . Hypertension   . Morbid obesity ([place])   . Stroke [facility]) 2013    PAST SURGICAL HISTORY: Past Surgical History:  Procedure Laterality Date  . CARDIOVERSION N/A 07/28/2016   Procedure: CARDIOVERSION;  Surgeon: Wellington Hampshire, MD;  Location: [facility] ORS;  Service: Cardiovascular;  Laterality: N/A;  . CARDIOVERSION N/A 08/07/2016   Procedure: CARDIOVERSION;  Surgeon:  Wellington Hampshire, MD;  Location: [facility] ORS;  Service: Cardiovascular;  Laterality: N/A;  . CAROTID ENDARTERECTOMY     [facility]; Dr. Lucky Cowboy  . CHOLECYSTECTOMY    . LEG SURGERY     right;infection    SOCIAL HISTORY:  Social History  Substance Use Topics  . Smoking status: Former Smoker    Packs/day: 0.50    Years: 5.00    Types: Cigarettes  . Smokeless tobacco: Never Used  . Alcohol use No    FAMILY HISTORY:  Family History  Problem Relation Age of Onset  . Heart disease Mother     DRUG ALLERGIES: No Known Allergies  Review of Systems  Constitutional: Negative for chills, fever and weight loss.  HENT: Negative for congestion.   Eyes: Negative for blurred vision and double vision.  Respiratory: Negative for cough, sputum production, shortness of breath and wheezing.   Cardiovascular: Negative for chest pain, palpitations, orthopnea, leg swelling and PND.  Gastrointestinal: Negative for abdominal pain, blood in stool, constipation, diarrhea, nausea and vomiting.  Genitourinary: Negative for dysuria, frequency, hematuria and urgency.  Musculoskeletal: Negative for falls.  Skin: Positive for rash.  Neurological: Negative for dizziness, tremors, focal weakness and headaches.  Endo/Heme/Allergies: Does not bruise/bleed easily.  Psychiatric/Behavioral: Negative for depression. The patient does not have insomnia.     MEDICATIONS AT HOME:  Prior to Admission medications   Medication Sig Start Date End Date Taking? Authorizing Provider  amiodarone (PACERONE) 200 MG tablet Take 1 tablet (200 mg total) by mouth daily. 09/18/16  Yes Wellington Hampshire, MD  atorvastatin (LIPITOR) 10 MG tablet TAKE ONE TABLET BY MOUTH ONCE DAILY 08/03/15  Yes Wellington Hampshire, MD  furosemide (LASIX) 40 MG tablet Take 1 tablet (40 mg total) by mouth daily. 08/25/16 11/23/16 Yes Wellington Hampshire, MD  glipiZIDE (GLUCOTROL) 10 MG tablet Take 10 mg by mouth 2 (two) times daily. 08/13/15  Yes [provider]    lisinopril (PRINIVIL,ZESTRIL) 10 MG tablet Take 1 tablet (10 mg total) by mouth daily. 08/17/16 11/15/16 Yes Wellington Hampshire, MD  metFORMIN (GLUCOPHAGE) 500 MG tablet Take 500 mg by mouth 2 (two) times daily.  06/18/16  Yes [provider]  metoprolol succinate (TOPROL-XL) 100 MG 24 hr tablet Take 1 tablet (100 mg total) by mouth daily. Take with or immediately following a meal. 08/17/16  Yes Wellington Hampshire, MD  spironolactone (ALDACTONE) 25 MG tablet Take 1 tablet (25 mg total) by mouth daily. 09/18/16 12/17/16 Yes Wellington Hampshire, MD  warfarin (COUMADIN) 5 MG tablet Take 1 tablet (5 mg total) by mouth one time only at 6 PM. Start from Sunday April 22nd 08/25/16  Yes Wellington Hampshire, MD      PHYSICAL EXAMINATION:   VITAL SIGNS: Blood pressure 137/65, pulse 62, temperature 98.6 F (37 C), temperature source Oral, resp. rate 18, height 5\' 7"  (1.702 m), weight 112.9 kg (249 lb), SpO2 97 %.  GENERAL:  66 y.o.-year-old patient lying in the bed with no acute distress.  EYES: Pupils equal, round, reactive to light and accommodation. No scleral icterus. Extraocular muscles intact.  HEENT: Head atraumatic, normocephalic. Oropharynx and nasopharynx clear.  NECK:  Supple, no jugular venous distention. No thyroid enlargement, no tenderness.  LUNGS: Normal breath sounds bilaterally, no wheezing, rales,rhonchi or crepitation. No use of accessory muscles of respiration.  CARDIOVASCULAR: S1, S2 normal. No murmurs, rubs, or gallops.  ABDOMEN: Soft, nontender, nondistended. Bowel sounds present. No organomegaly or mass.  EXTREMITIES: 1-2+ lower extremity and pedal edema, more pronounced on the left, no cyanosis, or clubbing. Left great toe is very swollen, erythematous proximal to metacarpophalangeal joint, small ulcer on the bottom of left great toe distal phalanx area is draining purulent fluid, some increased warmth, no significant tenderness to palpation NEUROLOGIC: Cranial nerves II through XII  are intact. Muscle strength 5/5 in all extremities. Sensation diminished in the lower extremities. Gait not checked.  PSYCHIATRIC: The patient is alert and oriented x 3.  SKIN: No obvious rash, lesion, or ulcer.   LABORATORY PANEL:   CBC  Recent Labs Lab 09/21/16 1452  WBC 7.9  HGB 8.9*  HCT 28.5*  PLT 390  MCV 72.1*  MCH 22.5*  MCHC 31.2*  RDW 21.8*   ------------------------------------------------------------------------------------------------------------------  Chemistries   Recent Labs Lab 09/21/16 1452  NA 132*  K 2.8*  CL 96*  CO2 27  GLUCOSE 142*  BUN 15  CREATININE 1.16*  CALCIUM 8.2*   ------------------------------------------------------------------------------------------------------------------  Cardiac Enzymes No results for input(s): TROPONINI in the last 168 hours. ------------------------------------------------------------------------------------------------------------------  RADIOLOGY: Dg Toe Great Left  Result Date: 09/21/2016 CLINICAL DATA:  Swelling and erythema of the left great toe with yellowish drainage and foul odor. EXAM: LEFT GREAT TOE COMPARISON:  None. FINDINGS: Abnormal appearance of the base of the left distal phalanx were there is bone destruction, osteopenia and bony sequestra noted in the adjacent soft tissues. Nondisplaced fracture lucency is also seen along its midportion with an intra-articular fracture along the base of the phalanx medially. The soft tissues are edematous in swelling with soft tissue  ulceration noted along the medial aspect of the great toe. Osteoarthritic joint space narrowing is seen of the interphalangeal joint of the great toe. No frank bone destruction is apparent of the adjacent first proximal phalanx though there is suggestion of mild periosteal new bone formation along its medial aspect. IMPRESSION: 1. Findings in keeping with osteomyelitis of the first distal phalangeal base with bony sequestra in the  adjacent medial soft tissues. There is overlying soft tissue swelling with ulceration of the great toe. 2. Nondisplaced fracture involving the midportion of the left distal phalanx. There also appears to be an intra-articular extension of fracture the medial aspect of the distal phalanx at the interphalangeal joint. 3. Subtle periosteal new bone formation is also seen along the medial aspect of the distal first proximal phalanx which may also reflect subtle changes of osteomyelitis. Electronically Signed   By: Ashley Royalty M.D.   On: 09/21/2016 18:02    EKG: Orders placed or performed in visit on 09/18/16  . EKG 12-Lead    IMPRESSION AND PLAN:  Active Problems:   Toe osteomyelitis, left (HCC)   Hypokalemia  #1. Left great toe osteomyelitis, initiate patient on antibiotic therapy after blood cultures were taken, get surface wound culture, get foot. MRI, podiatry consultation #2. Hyponatremia in Fluid overloaded patient, continue Lasix as soon as potassium level normalizes #3. Hypokalemia, supplement orally, check magnesium level and supplement as needed #4 anemia, follow closely, transfuse as needed #5. CK D stage III, follow with therapy, get urinalysis #6. History of atrial fibrillation,, cardiomyopathy, continue outpatient medications., Heart rate, blood pressure is well controlled, patient's oxygenation is good. Resume Lasix and potassium level is better  All the records are reviewed and case discussed with ED provider. Management plans discussed with the patient, family and they are in agreement.  CODE STATUS: Code Status History    Date Active Date Inactive Code Status Order ID Comments User Context   07/26/2016  3:30 PM 07/28/2016  6:12 PM Full Code [id number]  Theodoro Grist, MD Inpatient       TOTAL TIME TAKING CARE OF THIS PATIENT: 55 minutes.    Theodoro Grist M.D on 09/21/2016 at 7:46 PM  Between 7am to 6pm - Pager - [phone] After 6pm go to [email or web address] - password  [facility]  Office  [phone]  CC: Primary care physician; Patient, No Pcp Per

## 2016-09-21 NOTE — ED Provider Notes (Signed)
[facility] Emergency Department Provider Note   ____________________________________________   I have reviewed the triage vital signs and the nursing notes.   HISTORY  Chief Complaint Toe Pain   History limited by: Not Limited   HPI Meghan [name] is a 66 y.o. female who presents to the emergency department today because of concerns for left great toe pain, redness and swelling. Patient states she has been having problems with this toe for a few weeks. She saw her podiatrist 2 weeks ago. It sounds like they did a small debridement for an ulcer. This morning however when she woke up the pain and redness were worse. She has noticed some swelling and feels like she might have a blister on the top of her toe. The patient denies any fevers. She denies any nausea or vomiting.   Past Medical History:  Diagnosis Date  . Cardiomyopathy ([place])    a. 06/2013 Echo: EF 35-40%, glob HK, mildly dil LA, mild LVH, mild TR, mild to mod MR.  . Carotid arterial disease ([place])    a. 02/2012 s/p L CEA.  . Coronary artery disease    a. Presumed - 02/2012 Lexi MV: Anterior, anteroseptal, septal, apical infarct with mild peri-infarct ischemia, EF 41%- medically managed.  . 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 Hyperlipidemia   . Hypertension   . Morbid obesity ([place])   . Stroke [facility]) 2013    Patient Active Problem List   Diagnosis Date Noted  . Atrial fibrillation with RVR ([place]) 07/26/2016  . Acute on chronic systolic CHF (congestive heart failure) ([place]) 07/26/2016  . CKD (chronic kidney disease), stage III 07/26/2016  . Hyponatremia 07/26/2016  . Anemia 07/26/2016  . Encounter for therapeutic drug monitoring 07/16/2013  . Atrial fibrillation ([place]) 07/16/2013  . Paroxysmal atrial fibrillation ([place]) 07/16/2013  . Ischemic cardiomyopathy 07/16/2013  . Essential hypertension 07/16/2013  . Chronic systolic heart failure ([place]) 04/12/2012  . Coronary artery  disease   . Dyspnea 03/01/2012  . Preop cardiovascular exam 03/01/2012    Past Surgical History:  Procedure Laterality Date  . CARDIOVERSION N/A 07/28/2016   Procedure: CARDIOVERSION;  Surgeon: Wellington Hampshire, MD;  Location: [facility] ORS;  Service: Cardiovascular;  Laterality: N/A;  . CARDIOVERSION N/A 08/07/2016   Procedure: CARDIOVERSION;  Surgeon: Wellington Hampshire, MD;  Location: [facility] ORS;  Service: Cardiovascular;  Laterality: N/A;  . CAROTID ENDARTERECTOMY     [facility]; Dr. Lucky Cowboy  . CHOLECYSTECTOMY    . LEG SURGERY     right;infection    Prior to Admission medications   Medication Sig Start Date End Date Taking? Authorizing Provider  amiodarone (PACERONE) 200 MG tablet Take 1 tablet (200 mg total) by mouth daily. 09/18/16   Wellington Hampshire, MD  atorvastatin (LIPITOR) 10 MG tablet TAKE ONE TABLET BY MOUTH ONCE DAILY 08/03/15   Wellington Hampshire, MD  furosemide (LASIX) 40 MG tablet Take 1 tablet (40 mg total) by mouth daily. 08/25/16 11/23/16  Wellington Hampshire, MD  glipiZIDE (GLUCOTROL) 10 MG tablet Take 10 mg by mouth 2 (two) times daily. 08/13/15   [provider]  lisinopril (PRINIVIL,ZESTRIL) 10 MG tablet Take 1 tablet (10 mg total) by mouth daily. 08/17/16 11/15/16  Wellington Hampshire, MD  metFORMIN (GLUCOPHAGE) 500 MG tablet Take 500 mg by mouth 2 (two) times daily.  06/18/16   [provider]  metoprolol succinate (TOPROL-XL) 100 MG 24 hr tablet Take 1 tablet (100 mg total) by  mouth daily. Take with or immediately following a meal. 08/17/16   Wellington Hampshire, MD  spironolactone (ALDACTONE) 25 MG tablet Take 1 tablet (25 mg total) by mouth daily. 09/18/16 12/17/16  Wellington Hampshire, MD  warfarin (COUMADIN) 5 MG tablet Take 1 tablet (5 mg total) by mouth one time only at 6 PM. Start from Sunday April 22nd 08/25/16   Wellington Hampshire, MD    Allergies Patient has no known allergies.  Family History  Problem Relation Age of Onset  . Heart disease Mother     Social  History Social History  Substance Use Topics  . Smoking status: Former Smoker    Packs/day: 0.50    Years: 5.00    Types: Cigarettes  . Smokeless tobacco: Never Used  . Alcohol use No    Review of Systems Constitutional: No fever/chills Eyes: No visual changes. ENT: No sore throat. Cardiovascular: Denies chest pain. Respiratory: Denies shortness of breath. Gastrointestinal: No abdominal pain.  No nausea, no vomiting.  No diarrhea.   Genitourinary: Negative for dysuria. Musculoskeletal: Positive for toe redness and swelling.  Skin: Negative for rash. Neurological: Negative for headaches, focal weakness or numbness.  ____________________________________________   PHYSICAL EXAM:  VITAL SIGNS: ED Triage Vitals [09/21/16 1436]  Enc Vitals Group     BP 137/65     Pulse Rate 62     Resp 18     Temp 98.6 F (37 C)     Temp Source Oral     SpO2 97 %     Weight 249 lb (112.9 kg)     Height 5\' 7"  (1.702 m)   Constitutional: Alert and oriented. Well appearing and in no distress. Eyes: Conjunctivae are normal.  ENT   Head: Normocephalic and atraumatic.   Nose: No congestion/rhinnorhea.   Mouth/Throat: Mucous membranes are moist.   Neck: No stridor. Hematological/Lymphatic/Immunilogical: No cervical lymphadenopathy. Cardiovascular: Normal rate, regular rhythm.  No murmurs, rubs, or gallops.  Respiratory: Normal respiratory effort without tachypnea nor retractions. Breath sounds are clear and equal bilaterally. No wheezes/rales/rhonchi. Gastrointestinal: Soft and non tender. No rebound. No guarding.  Genitourinary: Deferred Musculoskeletal: Left great toe with swelling and erythema overlying the dorsal aspect extending below the 2nd and 3rd digits. Small ulcer to lateral aspect.  Neurologic:  Normal speech and language. No gross focal neurologic deficits are appreciated.  Skin:  Skin is warm, dry and intact. No rash noted. Psychiatric: Mood and affect are normal.  Speech and behavior are normal. Patient exhibits appropriate insight and judgment.  ____________________________________________    LABS (pertinent positives/negatives)  Labs Reviewed  CBC - Abnormal; Notable for the following:       Result Value   Hemoglobin 8.9 (*)    HCT 28.5 (*)    MCV 72.1 (*)    MCH 22.5 (*)    MCHC 31.2 (*)    RDW 21.8 (*)    All other components within normal limits  BASIC METABOLIC PANEL - Abnormal; Notable for the following:    Sodium 132 (*)    Potassium 2.8 (*)    Chloride 96 (*)    Glucose, Bld 142 (*)    Creatinine, Ser 1.16 (*)    Calcium 8.2 (*)    GFR calc non Af Amer 47 (*)    GFR calc Af Amer 55 (*)    All other components within normal limits     ____________________________________________   EKG  None  ____________________________________________    RADIOLOGY  Left toe  IMPRESSION: 1. Findings in keeping with osteomyelitis of the first distal phalangeal base with bony sequestra in the adjacent medial soft tissues. There is overlying soft tissue swelling with ulceration of the great toe. 2. Nondisplaced fracture involving the midportion of the left distal phalanx. There also appears to be an intra-articular extension of fracture the medial aspect of the distal phalanx at the interphalangeal joint. 3. Subtle periosteal new bone formation is also seen along the medial aspect of the distal first proximal phalanx which may also reflect subtle changes of osteomyelitis.  ____________________________________________   PROCEDURES  Procedures  ____________________________________________   INITIAL IMPRESSION / ASSESSMENT AND PLAN / ED COURSE  Pertinent labs & imaging results that were available during my care of the patient were reviewed by me and considered in my medical decision making (see chart for details).  Patient presented to the emergency department today because of concerns for worsening left great toe pain  swelling and redness. X-ray today is concerning for osteomyelitis. Will start antibiotics in the emergency department will admit to the hospital.  ____________________________________________   FINAL CLINICAL IMPRESSION(S) / ED DIAGNOSES  Final diagnoses:  Osteomyelitis, unspecified site, unspecified type [facility])     Note: This dictation was prepared with Dragon dictation. Any transcriptional errors that result from this process are unintentional     Nance Pear, MD 09/21/16 1919

## 2016-09-21 NOTE — Progress Notes (Addendum)
Pharmacy Antibiotic Note  Meghan [name] is a 66 y.o. female admitted on 09/21/2016 with wound infection of great toe with purulent dischange.  Pharmacy has been consulted for vancomycin and piperacillin/tazobactam dosing. Pt received one dose of vanc 1000 mg IV and piperacillin/tazobactam 3.375 g IV in ED  Plan: Begin piperacillin/tazobactam 3.375 g IV q 8 hours EI  Begin vancomycin 1000 mg IV q 12 hours (6 hour stacked dosing). Will draw trough prior to 5th dose which should represent steady state. Trough estimated to be 17 mcg/mL Current PK:  Ke = 0.054 Vd = 57 T1/2 = 12.8  Height: 5\' 7"  (170.2 cm) Weight: 249 lb (112.9 kg) IBW/kg (Calculated) : 61.6  Temp (24hrs), Avg:98.6 F (37 C), Min:98.6 F (37 C), Max:98.6 F (37 C)   Recent Labs Lab 09/18/16 1448 09/21/16 1452  WBC  --  7.9  CREATININE 1.16* 1.16*    Estimated Creatinine Clearance: 60.2 mL/min (A) (by C-G formula based on SCr of 1.16 mg/dL (H)).    No Known Allergies  Antimicrobials this admission: vancomycin 6/14 >> Piperacillin/tazo 6/14 >>   Dose adjustments this admission:  Microbiology results: 6/14 BCx: sent 6/14 Aerobic Cx:  Sent  Sputum:    MRSA PCR:   Thank you for allowing pharmacy to be a part of this patient's care.  Darrow Bussing, PharmD Pharmacy Resident 09/21/2016 7:58 PM

## 2016-09-21 NOTE — ED Notes (Signed)
Pt has significant swelling and redness noted to left great toe. Pt able to move toe but reports decreased sensation. [place]ould odor and yellow drainage noted from blisters on great toe. Warmth felt upon palpation. Pt reports having swelling over the past week but the toe became significantly worse last night.

## 2016-09-21 NOTE — ED Triage Notes (Signed)
Pt comes into the ED via POV c/o left big toe pain.  Patient is diabetic and has been following up with Dr. Vickki Muff and has an appt with him on Monday.  Patient states the redness and odor has gotten worse today.  Denies any drainage from the toe at this time point but states new blisters have become present.  Patient in NAD at this time with even and unlabored respirations.

## 2016-09-22 ENCOUNTER — Inpatient Hospital Stay: Payer: Medicare Other | Admitting: Anesthesiology

## 2016-09-22 ENCOUNTER — Encounter: Payer: Self-pay | Admitting: Anesthesiology

## 2016-09-22 ENCOUNTER — Encounter
Admission: EM | Disposition: A | Discharge status: Home Health | Payer: Self-pay | Source: Home / Self Care | Attending: Internal Medicine

## 2016-09-22 DIAGNOSIS — I2510 Atherosclerotic heart disease of native coronary artery without angina pectoris: Secondary | ICD-10-CM | POA: Diagnosis not present

## 2016-09-22 DIAGNOSIS — Z01810 Encounter for preprocedural cardiovascular examination: Secondary | ICD-10-CM

## 2016-09-22 DIAGNOSIS — M869 Osteomyelitis, unspecified: Secondary | ICD-10-CM | POA: Diagnosis not present

## 2016-09-22 DIAGNOSIS — I481 Persistent atrial fibrillation: Secondary | ICD-10-CM

## 2016-09-22 DIAGNOSIS — D508 Other iron deficiency anemias: Secondary | ICD-10-CM

## 2016-09-22 DIAGNOSIS — I509 Heart failure, unspecified: Secondary | ICD-10-CM | POA: Diagnosis not present

## 2016-09-22 DIAGNOSIS — I5022 Chronic systolic (congestive) heart failure: Secondary | ICD-10-CM

## 2016-09-22 DIAGNOSIS — I110 Hypertensive heart disease with heart failure: Secondary | ICD-10-CM | POA: Diagnosis not present

## 2016-09-22 HISTORY — PX: AMPUTATION TOE: SHX6595

## 2016-09-22 LAB — GLUCOSE, CAPILLARY
GLUCOSE-CAPILLARY: 99 mg/dL (ref 65–99)
GLUCOSE-CAPILLARY: 99 mg/dL (ref 65–99)
GLUCOSE-CAPILLARY: 122 mg/dL — AB (ref 65–99)
Glucose-Capillary: 114 mg/dL — ABNORMAL HIGH (ref 65–99)
Glucose-Capillary: 154 mg/dL — ABNORMAL HIGH (ref 65–99)

## 2016-09-22 LAB — FOLATE: Folate: 12.6 ng/mL (ref >5.9)

## 2016-09-22 LAB — CBC
HEMATOCRIT: 25.9 % — AB (ref 35.0–47.0)
HEMOGLOBIN: 8.1 g/dL — AB (ref 12.0–16.0)
MCH: 22 pg — AB (ref 26.0–34.0)
MCHC: 31.3 g/dL — ABNORMAL LOW (ref 32.0–36.0)
MCV: 70.4 fL — ABNORMAL LOW (ref 80.0–100.0)
Platelets: 365 10*3/uL (ref 150–440)
RBC: 3.68 MIL/uL — ABNORMAL LOW (ref 3.80–5.20)
RDW: 21.5 % — ABNORMAL HIGH (ref 11.5–14.5)
WBC: 9.3 10*3/uL (ref 3.6–11.0)

## 2016-09-22 LAB — BASIC METABOLIC PANEL
ANION GAP: 8 (ref 5–15)
BUN: 17 mg/dL (ref 6–20)
CO2: 28 mmol/L (ref 22–32)
Calcium: 7.9 mg/dL — ABNORMAL LOW (ref 8.9–10.3)
Chloride: 99 mmol/L — ABNORMAL LOW (ref 101–111)
Creatinine, Ser: 1.29 mg/dL — ABNORMAL HIGH (ref 0.44–1.00)
GFR calc Af Amer: 48 mL/min — ABNORMAL LOW (ref >60)
GFR calc non Af Amer: 42 mL/min — ABNORMAL LOW (ref >60)
GLUCOSE: 151 mg/dL — AB (ref 65–99)
Potassium: 3.2 mmol/L — ABNORMAL LOW (ref 3.5–5.1)
Sodium: 135 mmol/L (ref 135–145)

## 2016-09-22 LAB — RETICULOCYTES
RBC.: 3.58 MIL/uL — AB (ref 3.80–5.20)
Retic Count, Absolute: 89.5 10*3/uL (ref 19.0–183.0)
Retic Ct Pct: 2.5 % (ref 0.4–3.1)

## 2016-09-22 LAB — IRON AND TIBC
Iron: 15 ug/dL — ABNORMAL LOW (ref 28–170)
SATURATION RATIOS: 4 % — AB (ref 10.4–31.8)
TIBC: 370 ug/dL (ref 250–450)
UIBC: 355 ug/dL

## 2016-09-22 LAB — VITAMIN B12: Vitamin B-12: 372 pg/mL (ref 180–914)

## 2016-09-22 LAB — FERRITIN: FERRITIN: 13 ng/mL (ref 11–307)

## 2016-09-22 SURGERY — AMPUTATION, TOE
Anesthesia: General | Site: Toe | Laterality: Left | Wound class: Dirty or Infected

## 2016-09-22 MED ORDER — EPHEDRINE SULFATE 50 MG/ML IJ SOLN
INTRAMUSCULAR | Status: DC | PRN
  Administered 2016-09-22: 15 mg via INTRAVENOUS
  Administered 2016-09-22: 10 mg via INTRAVENOUS

## 2016-09-22 MED ORDER — ONDANSETRON HCL 4 MG/2ML IJ SOLN
INTRAMUSCULAR | Status: AC
  Filled 2016-09-22: qty 2

## 2016-09-22 MED ORDER — ONDANSETRON HCL 4 MG/2ML IJ SOLN
INTRAMUSCULAR | Status: DC | PRN
Start: 2016-09-22 — End: 2016-09-22
  Administered 2016-09-22: 4 mg via INTRAVENOUS

## 2016-09-22 MED ORDER — FENTANYL CITRATE (PF) 100 MCG/2ML IJ SOLN
INTRAMUSCULAR | Status: AC
  Filled 2016-09-22: qty 2

## 2016-09-22 MED ORDER — GLYCOPYRROLATE 0.2 MG/ML IJ SOLN
INTRAMUSCULAR | Status: DC | PRN
  Administered 2016-09-22: 0.2 mg via INTRAVENOUS

## 2016-09-22 MED ORDER — BUPIVACAINE HCL (PF) 0.5 % IJ SOLN
INTRAMUSCULAR | Status: AC
  Filled 2016-09-22: qty 30

## 2016-09-22 MED ORDER — ONDANSETRON HCL 4 MG/2ML IJ SOLN: 4.0000 mg | Freq: Once | INTRAMUSCULAR | Status: DC | PRN

## 2016-09-22 MED ORDER — PROPOFOL 10 MG/ML IV BOLUS
INTRAVENOUS | Status: AC
  Filled 2016-09-22: qty 20

## 2016-09-22 MED ORDER — MIDAZOLAM HCL 2 MG/2ML IJ SOLN
INTRAMUSCULAR | Status: DC | PRN
  Administered 2016-09-22 (×2): 1 mg via INTRAVENOUS

## 2016-09-22 MED ORDER — METOPROLOL SUCCINATE ER 50 MG PO TB24: 50.0000 mg | ORAL_TABLET | Freq: Every day | ORAL | Status: DC

## 2016-09-22 MED ORDER — MAGNESIUM SULFATE 4 GM/100ML IV SOLN
4.0000 g | Freq: Once | INTRAVENOUS | Status: AC
  Administered 2016-09-22: 4 g via INTRAVENOUS
  Filled 2016-09-22: qty 100

## 2016-09-22 MED ORDER — EPHEDRINE SULFATE 50 MG/ML IJ SOLN
INTRAMUSCULAR | Status: AC
  Filled 2016-09-22: qty 1

## 2016-09-22 MED ORDER — METOPROLOL SUCCINATE ER 50 MG PO TB24
100.0000 mg | ORAL_TABLET | Freq: Every day | ORAL | Status: DC
  Administered 2016-09-22 – 2016-09-24 (×3): 100 mg via ORAL
  Filled 2016-09-22 (×3): qty 2

## 2016-09-22 MED ORDER — SODIUM CHLORIDE 0.9 % IV SOLN
INTRAVENOUS | Status: AC
  Administered 2016-09-22: 08:00:00 via INTRAVENOUS

## 2016-09-22 MED ORDER — PROPOFOL 500 MG/50ML IV EMUL
INTRAVENOUS | Status: DC | PRN
  Administered 2016-09-22: 50 ug/kg/min via INTRAVENOUS

## 2016-09-22 MED ORDER — LIDOCAINE HCL 1 % IJ SOLN
INTRAMUSCULAR | Status: DC | PRN
  Administered 2016-09-22: 6 mL

## 2016-09-22 MED ORDER — POTASSIUM CHLORIDE CRYS ER 20 MEQ PO TBCR
40.0000 meq | EXTENDED_RELEASE_TABLET | Freq: Once | ORAL | Status: AC
  Administered 2016-09-22: 40 meq via ORAL
  Filled 2016-09-22: qty 2

## 2016-09-22 MED ORDER — FENTANYL CITRATE (PF) 100 MCG/2ML IJ SOLN
INTRAMUSCULAR | Status: DC | PRN
  Administered 2016-09-22: 25 ug via INTRAVENOUS

## 2016-09-22 MED ORDER — GLYCOPYRROLATE 0.2 MG/ML IJ SOLN
INTRAMUSCULAR | Status: AC
  Filled 2016-09-22: qty 1

## 2016-09-22 MED ORDER — MIDAZOLAM HCL 2 MG/2ML IJ SOLN
INTRAMUSCULAR | Status: AC
  Filled 2016-09-22: qty 2

## 2016-09-22 MED ORDER — VANCOMYCIN HCL 10 G IV SOLR
1250.0000 mg | INTRAVENOUS | Status: DC
  Administered 2016-09-22 – 2016-09-23 (×2): 1250 mg via INTRAVENOUS
  Filled 2016-09-22 (×3): qty 1250

## 2016-09-22 MED ORDER — LIDOCAINE HCL (PF) 1 % IJ SOLN
INTRAMUSCULAR | Status: AC
  Filled 2016-09-22: qty 30

## 2016-09-22 MED ORDER — PROPOFOL 10 MG/ML IV BOLUS
INTRAVENOUS | Status: DC | PRN
  Administered 2016-09-22: 70 mg via INTRAVENOUS

## 2016-09-22 MED ORDER — FENTANYL CITRATE (PF) 100 MCG/2ML IJ SOLN: 25.0000 ug | INTRAMUSCULAR | Status: DC | PRN

## 2016-09-22 MED ORDER — BUPIVACAINE HCL 0.5 % IJ SOLN
INTRAMUSCULAR | Status: DC | PRN
  Administered 2016-09-22: 6 mL

## 2016-09-22 SURGICAL SUPPLY — 38 items
BANDAGE ELASTIC 4 LF NS (GAUZE/BANDAGES/DRESSINGS) ×3 IMPLANT
BANDAGE STRETCH 3X4.1 STRL (GAUZE/BANDAGES/DRESSINGS) ×3 IMPLANT
BLADE MED AGGRESSIVE (BLADE) IMPLANT
BLADE SURG 15 STRL LF DISP TIS (BLADE) ×3 IMPLANT
BLADE SURG 15 STRL SS (BLADE) ×6
BNDG ESMARK 4X12 TAN STRL LF (GAUZE/BANDAGES/DRESSINGS) ×3 IMPLANT
BNDG GAUZE 4.5X4.1 6PLY STRL (MISCELLANEOUS) ×3 IMPLANT
CANISTER SUCT 1200ML W/VALVE (MISCELLANEOUS) ×3 IMPLANT
CNTNR SPEC 2.5X3XGRAD LEK (MISCELLANEOUS) ×1
CONT SPEC 4OZ STER OR WHT (MISCELLANEOUS) ×2
CONTAINER SPEC 2.5X3XGRAD LEK (MISCELLANEOUS) ×1 IMPLANT
CUFF TOURN 18 STER (MISCELLANEOUS) IMPLANT
CUFF TOURN 24 STER (MISCELLANEOUS) ×3 IMPLANT
CUFF TOURN DUAL PL 12 NO SLV (MISCELLANEOUS) IMPLANT
DRAPE FLUOR MINI C-ARM 54X84 (DRAPES) IMPLANT
DURAPREP 26ML APPLICATOR (WOUND CARE) IMPLANT
ELECT REM PT RETURN 9FT ADLT (ELECTROSURGICAL) ×3
ELECTRODE REM PT RTRN 9FT ADLT (ELECTROSURGICAL) ×1 IMPLANT
GAUZE PETRO XEROFOAM 1X8 (MISCELLANEOUS) ×6 IMPLANT
GAUZE SPONGE 4X4 12PLY STRL (GAUZE/BANDAGES/DRESSINGS) ×6 IMPLANT
GLOVE BIO SURGEON STRL SZ8 (GLOVE) ×3 IMPLANT
GLOVE INDICATOR 8.0 STRL GRN (GLOVE) ×3 IMPLANT
GOWN STRL REUS W/ TWL LRG LVL3 (GOWN DISPOSABLE) ×2 IMPLANT
GOWN STRL REUS W/TWL LRG LVL3 (GOWN DISPOSABLE) ×4
KIT RM TURNOVER STRD PROC AR (KITS) ×3 IMPLANT
LABEL OR SOLS (LABEL) IMPLANT
NDL SAFETY 18GX1.5 (NEEDLE) ×3 IMPLANT
NEEDLE HYPO 25X1 1.5 SAFETY (NEEDLE) ×12 IMPLANT
NS IRRIG 500ML POUR BTL (IV SOLUTION) ×3 IMPLANT
PACK EXTREMITY ARMC (MISCELLANEOUS) ×3 IMPLANT
PENCIL ELECTRO HAND CTR (MISCELLANEOUS) ×3 IMPLANT
RASP SM TEAR CROSS CUT (RASP) IMPLANT
STOCKINETTE STRL 6IN 960660 (GAUZE/BANDAGES/DRESSINGS) ×3 IMPLANT
SUT ETH BLK MONO 3 0 FS 1 12/B (SUTURE) ×6 IMPLANT
SUT ETHILON 5 0 PS 2 18 (SUTURE) ×6 IMPLANT
SUT VIC AB 4-0 FS2 27 (SUTURE) ×3 IMPLANT
SWAB CULTURE AMIES ANAERIB BLU (MISCELLANEOUS) IMPLANT
SYRINGE 10CC LL (SYRINGE) ×3 IMPLANT

## 2016-09-22 NOTE — Anesthesia Preprocedure Evaluation (Addendum)
Anesthesia Evaluation  Patient identified by MRN, date of birth, ID band Patient awake    Reviewed: Allergy & Precautions, NPO status , Patient's Chart, lab work & pertinent test results, reviewed documented beta blocker date and time   Airway Mallampati: III  TM Distance: >3 FB     Dental  (+) Chipped   Pulmonary shortness of breath, former smoker,           Cardiovascular hypertension, Pt. on medications and Pt. on home beta blockers + CAD, + Peripheral Vascular Disease and +CHF       Neuro/Psych CVA    GI/Hepatic   Endo/Other  diabetes, Type 2  Renal/GU Renal disease     Musculoskeletal   Abdominal   Peds  Hematology  (+) anemia ,   Anesthesia Other Findings   Reproductive/Obstetrics                             Anesthesia Physical Anesthesia Plan  ASA: III  Anesthesia Plan: General   Post-op Pain Management:    Induction: Intravenous  PONV Risk Score and Plan:   Airway Management Planned: Nasal Cannula  Additional Equipment:   Intra-op Plan:   Post-operative Plan:   Informed Consent: I have reviewed the patients History and Physical, chart, labs and discussed the procedure including the risks, benefits and alternatives for the proposed anesthesia with the patient or authorized representative who has indicated his/her understanding and acceptance.   Dental advisory given  Plan Discussed with: CRNA and Surgeon  Anesthesia Plan Comments:        Anesthesia Quick Evaluation

## 2016-09-22 NOTE — Transfer of Care (Signed)
Immediate Anesthesia Transfer of Care Note  Patient: Meghan [name]  Procedure(s) Performed: Procedure(s): AMPUTATION TOE (Left)  Patient Location: PACU  Anesthesia Type:General  Level of Consciousness: awake and patient cooperative  Airway & Oxygen Therapy: Patient Spontanous Breathing and Patient connected to nasal cannula oxygen  Post-op Assessment: Report given to RN and Post -op Vital signs reviewed and stable  Post vital signs: Reviewed and stable  Last Vitals:  Vitals:   09/22/16 0731 09/22/16 1126  BP: 130/72 (!) 146/84  Pulse: (!) 58 63  Resp: 16 17  Temp: 37 C 36.1 C    Last Pain:  Vitals:   09/22/16 1126  TempSrc: Tympanic  PainSc:          Complications: No apparent anesthesia complications

## 2016-09-22 NOTE — Anesthesia Post-op Follow-up Note (Cosign Needed)
Anesthesia QCDR form completed.        

## 2016-09-22 NOTE — Op Note (Signed)
Operative note   Surgeon: Dr. Albertine Patricia, DPM.    Assistant: None    Preop diagnosis: Osteomyelitis left great toe    Postop diagnosis: Same    Procedure:   1. Amputation left great toe to the first MTP joint level.          EBL: 20 cc    Anesthesia:general O anesthesia team and local anesthesia delivered by me consisting of 10 cc of lidocaine and Marcaine mixed 50-50    Hemostasis: Ankle tourniquet 250 mmHg pressure for 15 minutes    Specimen: Left great toe due to the metatarsophalangeal joint level with infection and the distal middle phalanges    Complications: None    Operative indications: Patient had a chronic infected draining ulcer with secondary radiographic and MRI changes indicating osteomyelitis in both proximal and distal phalanges of the left great toe. Cellulitis is extensive throughout the toe started to spread and the dorsum of the foot significantly.    Procedure:  Patient was brought into the OR and placed on the operating table in thesupine position. After anesthesia was obtained theleft lower extremity was prepped and draped in usual sterile fashion.  Operative Report: At this time to his directed to the great toe of the left foot. 2 similar incisions were made starting medially carried dorsally and plantarly before merging again on the lateral aspect of the toe. The incision was carried down to bone where capsular and tendinous tissues were released from the MTP joint. Toes and removed. The ulcer plantarly which had heavy drainage from it was then identified and isolated and cultured and this was sent to the lab for culture and sensitivity. The rest the toe was sent for pathology. After copious irrigation appeared that all infected tissue was removed. A hemostat was used to probe mediolaterally and plantarly and dorsally but no specific sinus tracts were noted. With compression there is no further purulent drainage from the foot. After copious irrigation the  tourniquet was then released and bleeders were clamped and bovied as required. One arterial bleeder was noted laterally this was tied off with 3-0 Vicryl. Once this was accomplished bleeding was under control good capillary bleeding was noted. At this point the area was copiously irrigated again with normal saline solution and the capsule tissue was closed over the metatarsal head with 3-0 Vicryl simple interrupted sutures. Skin and subcutaneous tissue was then closed with 3-0 nylon simple interrupted and vertical mattress type sutures. Skin margins came together nicely. A sterile compressive dressing was placed across wound consisting of Xeroform gauze 4 x 4's Kerlix, and forearm an APD pad and Ace wrap.    Patient tolerated the procedure and anesthesia well.  Was transported from the OR to the PACU with all vital signs stable and vascular status intact. To be discharged per routine protocol.  Will follow up in approximately 1 week in the outpatient clinic.

## 2016-09-22 NOTE — Progress Notes (Signed)
Consents signed by patient. Verbalizes understanding of procedure.

## 2016-09-22 NOTE — Progress Notes (Signed)
Pharmacy Antibiotic Note  Meghan [name] is a 66 y.o. female admitted on 09/21/2016 with wound infection of great toe with purulent dischange.  Pharmacy has been consulted for vancomycin and piperacillin/tazobactam dosing. Pt received one dose of vanc 1000 mg IV and piperacillin/tazobactam 3.375 g IV in ED  Plan: Continue piperacillin/tazobactam 3.375 g IV q 8 hours EI  Patient has had a slight decline in renal function. Also do to patient's size she is at risk for accumulation. Will change dose to Vancomycin 1250mg  q 24 hours. Check trough 6/17 @ 2330  Height: 5\' 7"  (170.2 cm) Weight: 244 lb 11.2 oz (111 kg) IBW/kg (Calculated) : 61.6  Temp (24hrs), Avg:98.2 F (36.8 C), Min:97.7 F (36.5 C), Max:98.6 F (37 C)   Recent Labs Lab 09/18/16 1448 09/21/16 1452 09/22/16 0402  WBC  --  7.9 9.3  CREATININE 1.16* 1.16* 1.29*    Estimated Creatinine Clearance: 53.6 mL/min (A) (by C-G formula based on SCr of 1.29 mg/dL (H)).    No Known Allergies  Antimicrobials this admission: vancomycin 6/14 >> Piperacillin/tazo 6/14 >>   Dose adjustments this admission:  Microbiology results: Recent Results (from the past 240 hour(s))  Aerobic Culture (superficial specimen)     Status: None (Preliminary result)   Collection Time: 09/21/16  8:17 PM  Result Value Ref Range Status   Specimen Description TOE LEFT  Final   Special Requests NONE  Final   Gram Stain   Final    MODERATE WBC PRESENT, PREDOMINANTLY PMN ABUNDANT GRAM POSITIVE COCCI IN PAIRS IN CHAINS ABUNDANT GRAM NEGATIVE RODS FEW GRAM POSITIVE RODS Performed at [facility], [place] [address], [place], [place] [address]    Culture PENDING  Incomplete   Report Status PENDING  Incomplete  CULTURE, BLOOD (ROUTINE X 2) w Reflex to ID Panel     Status: None (Preliminary result)   Collection Time: 09/21/16  8:17 PM  Result Value Ref Range Status   Specimen Description BLOOD LAC  Final   Special Requests   Final    BOTTLES DRAWN  AEROBIC AND ANAEROBIC Blood Culture adequate volume   Culture NO GROWTH < 12 HOURS  Final   Report Status PENDING  Incomplete  CULTURE, BLOOD (ROUTINE X 2) w Reflex to ID Panel     Status: None (Preliminary result)   Collection Time: 09/21/16  8:17 PM  Result Value Ref Range Status   Specimen Description BLOOD LFOA  Final   Special Requests   Final    BOTTLES DRAWN AEROBIC AND ANAEROBIC Blood Culture adequate volume   Culture NO GROWTH < 12 HOURS  Final   Report Status PENDING  Incomplete     Thank you for allowing pharmacy to be a part of this patient's care.  Ramond Dial, Pharm.D, BCPS Clinical Pharmacist  09/22/2016 9:33 AM

## 2016-09-22 NOTE — Progress Notes (Signed)
Inpatient Diabetes Program Recommendations  AACE/ADA: New Consensus Statement on Inpatient Glycemic Control (2015)  Target Ranges:  Prepandial:   less than 140 mg/dL      Peak postprandial:   less than 180 mg/dL (1-2 hours)      Critically ill patients:  140 - 180 mg/dL   Lab Results  Component Value Date   GLUCAP 114 (H) 09/22/2016   HGBA1C 8.1 (H) 07/26/2016    Review of Glycemic Control  Results for FRANCHESCA, VENEZIANO (MRN [id number]) as of 09/22/2016 10:19  Ref. Range 08/07/2016 07:18 09/21/2016 21:15 09/22/2016 07:26  Glucose-Capillary Latest Ref Range: 65 - 99 mg/dL 170 (H) 161 (H) 114 (H)    Diabetes history: Type 2 Outpatient Diabetes medications: Glipizide 10mg  bid, Metformin 500mg  bid Current orders for Inpatient glycemic control: Novolog 0-15 units tid, Novolog 0-5 units qhs  Inpatient Diabetes Program Recommendations:  While the patient is NPO, consider changing the Novolog to 0-15 units q4h.  Gentry Fitz, RN, BA, MHA, CDE Diabetes Coordinator Inpatient Diabetes Program  [phone] (Team Pager) [phone] ([place]) 09/22/2016 10:21 AM

## 2016-09-22 NOTE — Anesthesia Procedure Notes (Signed)
Procedure Name: LMA Insertion Date/Time: 09/22/2016 1:32 PM Performed by: Darlyne Russian Pre-anesthesia Checklist: Patient identified, Emergency Drugs available, Suction available, Patient being monitored and Timeout performed Patient Re-evaluated:Patient Re-evaluated prior to inductionOxygen Delivery Method: Circle system utilized Preoxygenation: Pre-oxygenation with 100% oxygen Intubation Type: IV induction Ventilation: Mask ventilation without difficulty LMA: LMA inserted LMA Size: 4.0 Number of attempts: 1 Placement Confirmation: positive ETCO2 and breath sounds checked- equal and bilateral Tube secured with: Tape Dental Injury: Teeth and Oropharynx as per pre-operative assessment

## 2016-09-22 NOTE — Anesthesia Postprocedure Evaluation (Signed)
Anesthesia Post Note  Patient: Meghan [name]  Procedure(s) Performed: Procedure(s) (LRB): AMPUTATION TOE (Left)  Patient location during evaluation: PACU Anesthesia Type: General Level of consciousness: awake and alert and oriented Pain management: pain level controlled Vital Signs Assessment: post-procedure vital signs reviewed and stable Respiratory status: spontaneous breathing Cardiovascular status: blood pressure returned to baseline Anesthetic complications: no     Last Vitals:  Vitals:   09/22/16 1309 09/22/16 1310  BP: 113/60   Pulse: 64 65  Resp: 20 (!) 21  Temp: (P) 36.2 C     Last Pain:  Vitals:   09/22/16 1309  TempSrc: Temporal  PainSc: 0-No pain                 Bronislaus Verdell

## 2016-09-22 NOTE — Progress Notes (Signed)
[facility] NAME: Meghan [name]    MR#:  [id number]  DATE OF BIRTH:  05/20/1950  SUBJECTIVE:  Patient here from [place] office found to have osteo  REVIEW OF SYSTEMS:    Review of Systems  Constitutional: Negative for fever, chills weight loss HENT: Negative for ear pain, nosebleeds, congestion, facial swelling, rhinorrhea, neck pain, neck stiffness and ear discharge.   Respiratory: Negative for cough, shortness of breath, wheezing  Cardiovascular: Negative for chest pain, palpitations and leg swelling.  Gastrointestinal: Negative for heartburn, abdominal pain, vomiting, diarrhea or consitpation Genitourinary: Negative for dysuria, urgency, frequency, hematuria Musculoskeletal: Negative for back pain or joint pain Neurological: Negative for dizziness, seizures, syncope, focal weakness,  numbness and headaches.  Hematological: Does not bruise/bleed easily.  Psychiatric/Behavioral: Negative for hallucinations, confusion, dysphoric mood Skin: Left big toe with pain and erythema   Tolerating Diet:npo      DRUG ALLERGIES:  No Known Allergies  VITALS:  Blood pressure 130/72, pulse (!) 58, temperature 98.6 F (37 C), temperature source Oral, resp. rate 16, height 5\' 7"  (1.702 m), weight 111 kg (244 lb 11.2 oz), SpO2 95 %.  PHYSICAL EXAMINATION:  Constitutional: Appears well-developed and well-nourished. No distress. HENT: Normocephalic. Marland Kitchen Oropharynx is clear and moist.  Eyes: Conjunctivae and EOM are normal. PERRLA, no scleral icterus.  Neck: Normal ROM. Neck supple. No JVD. No tracheal deviation. CVS: RRR, S1/S2 +, no murmurs, no gallops, no carotid bruit.  Pulmonary: Effort and breath sounds normal, no stridor, rhonchi, wheezes, rales.  Abdominal: Soft. BS +,  no distension, tenderness, rebound or guarding.  Musculoskeletal: Normal range of motion. No edema and no tenderness.  Neuro: Alert. CN 2-12 grossly intact. No focal  deficits. Skin: Left big toe with erythema  Psychiatric: Normal mood and affect.      LABORATORY PANEL:   CBC  Recent Labs Lab 09/22/16 0402  WBC 9.3  HGB 8.1*  HCT 25.9*  PLT 365   ------------------------------------------------------------------------------------------------------------------  Chemistries   Recent Labs Lab 09/21/16 1452 09/22/16 0402  NA 132* 135  K 2.8* 3.2*  CL 96* 99*  CO2 27 28  GLUCOSE 142* 151*  BUN 15 17  CREATININE 1.16* 1.29*  CALCIUM 8.2* 7.9*  MG 1.3*  --    ------------------------------------------------------------------------------------------------------------------  Cardiac Enzymes No results for input(s): TROPONINI in the last 168 hours. ------------------------------------------------------------------------------------------------------------------  RADIOLOGY:  Mr Foot Left Wo Contrast  Result Date: 09/21/2016 CLINICAL DATA:  Osteomyelitis of the left great toe. Diabetes. Ulceration. Swelling and erythema. EXAM: MRI OF THE LEFT FOOT WITHOUT CONTRAST TECHNIQUE: Multiplanar, multisequence MR imaging of the left forefoot was performed. No intravenous contrast was administered. COMPARISON:  None. FINDINGS: Bones/Joint/Cartilage MRI confirms abnormal osseous edema in the proximal and distal phalanges of the great toe compatible with osteomyelitis. Cortical demineralization/ destruction in the distal phalanx compatible with more advanced osteomyelitis. Suspected transverse fracture of the distal phalanx based on the conventional radiographs. No other regions of osteomyelitis are identified in the forefoot. There is considerable degenerative arthropathy at the Lisfranc joint without malalignment. Type 2 accessory navicular, without edema. Degenerative midfoot arthropathy. Ligaments The Lisfranc ligament appears intact on image 8/7. Muscles and Tendons Low-level edema tracks within the plantar musculature of the foot in a generalized  fashion. Atrophic musculature of the forefoot. Soft tissues Dorsal subcutaneous edema tracking along the forefoot. Plantar ulceration below the head of the first metatarsal on image 23/4. Extensive subcutaneous edema in the great toe with a less  degree of edema in the second toe. IMPRESSION: 1. Osteomyelitis of the proximal and distal phalanges of the great toe with cortical demineralization in destruction of parts of the distal phalanx. No drainable abscess. 2. Dorsal subcutaneous edema in the forefoot, cellulitis not excluded. There is low-grade edema tracking along the atrophic musculature of the plantar foot, which could reflect low grade myositis. No drainable abscess observed. 3. Plantar ulceration below the head of the first metatarsal. 4. Degenerative midfoot and Lisfranc joint arthropathy. Lisfranc ligament intact. Electronically Signed   By: Van Clines M.D.   On: 09/21/2016 22:27   Dg Toe Great Left  Result Date: 09/21/2016 CLINICAL DATA:  Swelling and erythema of the left great toe with yellowish drainage and foul odor. EXAM: LEFT GREAT TOE COMPARISON:  None. FINDINGS: Abnormal appearance of the base of the left distal phalanx were there is bone destruction, osteopenia and bony sequestra noted in the adjacent soft tissues. Nondisplaced fracture lucency is also seen along its midportion with an intra-articular fracture along the base of the phalanx medially. The soft tissues are edematous in swelling with soft tissue ulceration noted along the medial aspect of the great toe. Osteoarthritic joint space narrowing is seen of the interphalangeal joint of the great toe. No frank bone destruction is apparent of the adjacent first proximal phalanx though there is suggestion of mild periosteal new bone formation along its medial aspect. IMPRESSION: 1. Findings in keeping with osteomyelitis of the first distal phalangeal base with bony sequestra in the adjacent medial soft tissues. There is overlying  soft tissue swelling with ulceration of the great toe. 2. Nondisplaced fracture involving the midportion of the left distal phalanx. There also appears to be an intra-articular extension of fracture the medial aspect of the distal phalanx at the interphalangeal joint. 3. Subtle periosteal new bone formation is also seen along the medial aspect of the distal first proximal phalanx which may also reflect subtle changes of osteomyelitis. Electronically Signed   By: Ashley Royalty M.D.   On: 09/21/2016 18:02     ASSESSMENT AND PLAN:   66 year old female with diabetes who is found to have osteomyelitis of first big toe left foot  1. Osteomyelitis left great toe: Continue Zosyn and vancomycin Follow-up on wound cultures Plan to go to the OR today for debridement Appreciate podiatry consult  2. Atrial fibrillation: Hold Coumadin for now Cardiology consult  3. Chronic systolic heart failure ejection fraction 25-30%: Continue lisinopril and metoprolol and Aldactone  4. Acute on chronic anemia: Monitor CBC Check iron panel 5. Hyperlipidemia: Continue statin  6. Diabetes: Hold oral medications as patient isn't by mouth Continue sliding scale insulin  7. Hypokalemia: Replete and recheck in a.m. 8. Chronic kidney disease stage III: Creatinine stable baseline  Management plans discussed with the patient and she is in agreement.  CODE STATUS: full  TOTAL TIME TAKING CARE OF THIS PATIENT: 30 minutes.     POSSIBLE D/C 1-2 days, DEPENDING ON CLINICAL CONDITION.   Tylena Prisk M.D on 09/22/2016 at 9:31 AM  Between 7am to 6pm - Pager - [phone] After 6pm go to [email or web address] - password [email or web address] [facility]  Office  [phone]  CC: Primary care physician; Patient, No Pcp Per  Note: This dictation was prepared with Dragon dictation along with smaller phrase technology. Any transcriptional errors that result from this process are unintentional.

## 2016-09-22 NOTE — H&P (Signed)
H and P has been reviewed and no changes are noted. I explained to the patient the consent form and she and her family understand and accept the risks [place] associated with this surgery. Decision to remove the left great toe to the MTP joint level is due to the extensive involvement with bone infection and soft tissue infection to the left great toe.

## 2016-09-22 NOTE — Consult Note (Signed)
Patient Demographics  Meghan [name], is a 66 y.o. female   MRN: [id number]   DOB - [birth year]  Admit Date - 09/21/2016    Outpatient Primary MD for the patient is Patient, No Pcp Per  Consult requested in the Hospital by Bettey Costa, MD, On 09/22/2016    Reason for consult diabetic ulceration to the left great toe with heavy drainage MRI and x-rays indicate osteomyelitis.   With History of -  Past Medical History:  Diagnosis Date  . Cardiomyopathy ([place])    a. 06/2013 Echo: EF 35-40%, glob HK, mildly dil LA, mild LVH, mild TR, mild to mod MR.  . Carotid arterial disease ([place])    a. 02/2012 s/p L CEA.  . Coronary artery disease    a. Presumed - 02/2012 Lexi MV: Anterior, anteroseptal, septal, apical infarct with mild peri-infarct ischemia, EF 41%- medically managed.  . 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 Hyperlipidemia   . Hypertension   . Morbid obesity ([place])   . Stroke [facility]) 2013      Past Surgical History:  Procedure Laterality Date  . CARDIOVERSION N/A 07/28/2016   Procedure: CARDIOVERSION;  Surgeon: Wellington Hampshire, MD;  Location: [facility] ORS;  Service: Cardiovascular;  Laterality: N/A;  . CARDIOVERSION N/A 08/07/2016   Procedure: CARDIOVERSION;  Surgeon: Wellington Hampshire, MD;  Location: [facility] ORS;  Service: Cardiovascular;  Laterality: N/A;  . CAROTID ENDARTERECTOMY     [facility]; Dr. Lucky Cowboy  . CHOLECYSTECTOMY    . LEG SURGERY     right;infection    in for   Chief Complaint  Patient presents with  . Toe Pain     HPI  Meghan [name]  is a 66 y.o. female, Patient is long-term poorly controlled diabetic history of ulceration to the left great toe as well as the right submetatarsal 1 area. She states over the last week she's noted the toe draining more on the left foot as well as  developing more redness to the toe to the point where spreading up into her foot. She was hospitalized about 2 months ago because of wound on her right foot which has stabilized.    Review of Systems: She is alert and well-oriented 66 year old Caucasian female. States that she does see a endocrinologist every few months. She is with to her family members this timeframe. She denies any pain or discomfort. States she has significant peripheral neuropathy. She had a cardiology consult today and the cardiologist feels that she is okay for surgery at this timeframe. HEENT is within normal limits. Lungs are clear. No specific problems with genitourinary system. History of cardiomyopathy with cardioversion  GI no specific problems a little bit of nausea this morning. Neurologic: Patient has a significant history of peripheral neuropathy secondary to diabetes.  Social History Social History  Substance Use Topics  . Smoking status: Former Smoker    Packs/day: 0.50    Years: 5.00    Types: Cigarettes  . Smokeless tobacco: Never Used  . Alcohol use No     Family History Family History  Problem Relation Age of Onset  . Heart disease Mother     Prior to  Admission medications   Medication Sig Start Date End Date Taking? Authorizing Provider  amiodarone (PACERONE) 200 MG tablet Take 1 tablet (200 mg total) by mouth daily. 09/18/16  Yes Wellington Hampshire, MD  atorvastatin (LIPITOR) 10 MG tablet TAKE ONE TABLET BY MOUTH ONCE DAILY 08/03/15  Yes Wellington Hampshire, MD  furosemide (LASIX) 40 MG tablet Take 1 tablet (40 mg total) by mouth daily. 08/25/16 11/23/16 Yes Wellington Hampshire, MD  glipiZIDE (GLUCOTROL) 10 MG tablet Take 10 mg by mouth 2 (two) times daily. 08/13/15  Yes [provider]  lisinopril (PRINIVIL,ZESTRIL) 10 MG tablet Take 1 tablet (10 mg total) by mouth daily. 08/17/16 11/15/16 Yes Wellington Hampshire, MD  metFORMIN (GLUCOPHAGE) 500 MG tablet Take 500 mg by mouth 2 (two) times daily.   06/18/16  Yes [provider]  metoprolol succinate (TOPROL-XL) 100 MG 24 hr tablet Take 1 tablet (100 mg total) by mouth daily. Take with or immediately following a meal. 08/17/16  Yes Wellington Hampshire, MD  spironolactone (ALDACTONE) 25 MG tablet Take 1 tablet (25 mg total) by mouth daily. 09/18/16 12/17/16 Yes Wellington Hampshire, MD  warfarin (COUMADIN) 5 MG tablet Take 1 tablet (5 mg total) by mouth one time only at 6 PM. Start from Sunday April 22nd 08/25/16  Yes Arida, Muhammad A, MD    Anti-infectives    Start     Dose/Rate Route Frequency Ordered Stop   09/22/16 2359  vancomycin (VANCOCIN) 1,250 mg in sodium chloride 0.9 % 250 mL IVPB     1,250 mg 166.7 mL/hr over 90 Minutes Intravenous Every 24 hours 09/22/16 0933     09/22/16 0200  piperacillin-tazobactam (ZOSYN) IVPB 3.375 g     3.375 g 12.5 mL/hr over 240 Minutes Intravenous Every 8 hours 09/21/16 1955     06 /15/18 0100  vancomycin (VANCOCIN) IVPB 1000 mg/200 mL premix  Status:  Discontinued     1,000 mg 200 mL/hr over 60 Minutes Intravenous Every 12 hours 09/21/16 2019 09/22/16 0933   09/21/16 2000  piperacillin-tazobactam (ZOSYN) IVPB 3.375 g     3.375 g 100 mL/hr over 30 Minutes Intravenous  Once 09/21/16 1955 09/21/16 2044   09/21/16 1830  vancomycin (VANCOCIN) IVPB 1000 mg/200 mL premix     1,000 mg 200 mL/hr over 60 Minutes Intravenous  Once 09/21/16 1821 09/21/16 1947      Scheduled Meds: . amiodarone  200 mg Oral Daily  . atorvastatin  10 mg Oral Daily  . docusate sodium  100 mg Oral BID  . insulin aspart  0-15 Units Subcutaneous TID WC  . insulin aspart  0-5 Units Subcutaneous QHS  . lisinopril  10 mg Oral Daily  . metoprolol succinate  100 mg Oral Daily  . potassium chloride  40 mEq Oral Q4H  . spironolactone  25 mg Oral Daily   Continuous Infusions: . sodium chloride 50 mL/hr at 09/22/16 0805  . magnesium sulfate 1 - 4 g bolus IVPB    . piperacillin-tazobactam (ZOSYN)  IV 3.375 g (09/22/16 1010)  .  vancomycin     PRN Meds:.acetaminophen **OR** acetaminophen, HYDROcodone-acetaminophen, ondansetron **OR** ondansetron (ZOFRAN) IV  No Known Allergies  Physical Exam  Vitals  Blood pressure 130/72, pulse (!) 58, temperature 98.6 F (37 C), temperature source Oral, resp. rate 16, height 5' 7"  (1.702 m), weight 111 kg (244 lb 11.2 oz), SpO2 95 %.  Lower Extremity exam:  Vascular:DP pulses +2 over 4 bilateral difficult fill the PT pulse right now  due to some swelling in the ankle on the left side right side is +1 over 4.  Dermatological: Patient has a draining ulceration to the plantar medial IPJ of the left hallux. There is heavy purulent drainage from the region. Noted erythema and redness throughout the toe progressing to the dorsum of the foot. Patient also has a stable healed ulceration on the sub-met one area of the right foot it appears to be stable with some dermal hemorrhagic tissue but no evidence of drainage or redness to the region.  Neurological: Significant peripheral neuropathy secondary to diabetes  Ortho: X-rays and MRI are indicative of significant osteomyelitic changes to the left great toe. MRI showed involvement of both distal and proximal phalanges.  Data Review  CBC  Recent Labs Lab 09/21/16 1452 09/22/16 0402  WBC 7.9 9.3  HGB 8.9* 8.1*  HCT 28.5* 25.9*  PLT 390 365  MCV 72.1* 70.4*  MCH 22.5* 22.0*  MCHC 31.2* 31.3*  RDW 21.8* 21.5*   ------------------------------------------------------------------------------------------------------------------  Chemistries   Recent Labs Lab 09/18/16 1448 09/21/16 1452 09/22/16 0402  NA 137 132* 135  K 3.7 2.8* 3.2*  CL 97 96* 99*  CO2 22 27 28   GLUCOSE 123* 142* 151*  BUN 18 15 17   CREATININE 1.16* 1.16* 1.29*  CALCIUM 8.4* 8.2* 7.9*  MG  --  1.3*  --    -----------------------------------------------------------------------------------Imaging results:   Mr Foot Left Wo Contrast  Result Date:  09/21/2016 CLINICAL DATA:  Osteomyelitis of the left great toe. Diabetes. Ulceration. Swelling and erythema. EXAM: MRI OF THE LEFT FOOT WITHOUT CONTRAST TECHNIQUE: Multiplanar, multisequence MR imaging of the left forefoot was performed. No intravenous contrast was administered. COMPARISON:  None. FINDINGS: Bones/Joint/Cartilage MRI confirms abnormal osseous edema in the proximal and distal phalanges of the great toe compatible with osteomyelitis. Cortical demineralization/ destruction in the distal phalanx compatible with more advanced osteomyelitis. Suspected transverse fracture of the distal phalanx based on the conventional radiographs. No other regions of osteomyelitis are identified in the forefoot. There is considerable degenerative arthropathy at the Lisfranc joint without malalignment. Type 2 accessory navicular, without edema. Degenerative midfoot arthropathy. Ligaments The Lisfranc ligament appears intact on image 8/7. Muscles and Tendons Low-level edema tracks within the plantar musculature of the foot in a generalized fashion. Atrophic musculature of the forefoot. Soft tissues Dorsal subcutaneous edema tracking along the forefoot. Plantar ulceration below the head of the first metatarsal on image 23/4. Extensive subcutaneous edema in the great toe with a less degree of edema in the second toe. IMPRESSION: 1. Osteomyelitis of the proximal and distal phalanges of the great toe with cortical demineralization in destruction of parts of the distal phalanx. No drainable abscess. 2. Dorsal subcutaneous edema in the forefoot, cellulitis not excluded. There is low-grade edema tracking along the atrophic musculature of the plantar foot, which could reflect low grade myositis. No drainable abscess observed. 3. Plantar ulceration below the head of the first metatarsal. 4. Degenerative midfoot and Lisfranc joint arthropathy. Lisfranc ligament intact. Electronically Signed   By: Van Clines M.D.   On:  09/21/2016 22:27   Dg Toe Great Left  Result Date: 09/21/2016 CLINICAL DATA:  Swelling and erythema of the left great toe with yellowish drainage and foul odor. EXAM: LEFT GREAT TOE COMPARISON:  None. FINDINGS: Abnormal appearance of the base of the left distal phalanx were there is bone destruction, osteopenia and bony sequestra noted in the adjacent soft tissues. Nondisplaced fracture lucency is also seen along its midportion with an  intra-articular fracture along the base of the phalanx medially. The soft tissues are edematous in swelling with soft tissue ulceration noted along the medial aspect of the great toe. Osteoarthritic joint space narrowing is seen of the interphalangeal joint of the great toe. No frank bone destruction is apparent of the adjacent first proximal phalanx though there is suggestion of mild periosteal new bone formation along its medial aspect. IMPRESSION: 1. Findings in keeping with osteomyelitis of the first distal phalangeal base with bony sequestra in the adjacent medial soft tissues. There is overlying soft tissue swelling with ulceration of the great toe. 2. Nondisplaced fracture involving the midportion of the left distal phalanx. There also appears to be an intra-articular extension of fracture the medial aspect of the distal phalanx at the interphalangeal joint. 3. Subtle periosteal new bone formation is also seen along the medial aspect of the distal first proximal phalanx which may also reflect subtle changes of osteomyelitis. Electronically Signed   By: Ashley Royalty M.D.   On: 09/21/2016 18:02    Assessment & Plan: Patient has osteomyelitis to both distal and proximal phalanges on the left great toe. Draining infected ulceration with cellulitis to the toe is spreading to the dorsum of the foot. Soft tissue damage appears to be extensive around the distal and mid portion to the proximal phalanx mid shaft level. Recent scan is noted at the base of the toe. Vascular dressings  to be stable with palpable pulses. Cardiac clearance was given this morning by Dr. Tanda Rockers. Plan: Based on the extensive involvement with bone and soft tissue infection to the great toe I think her best bet of healing from this in a timely fashion would be an amputation to the MTP joint level. I gave her the options of trying to remove the bone and hope for the best of my concerns are that soft tissue infection is spreading proximally and would not allow for good drainage and clearance of infection of those regions also it would not be able to be closed primarily would likely be a prolonged healing time. I also don't think she has good enough tissue plantarly to do a partial amputation of the digit. After explaining by opinions to the family they elected to go ahead and remove the toe to the MTP joint level. Think this will give her the best chance of healing and returned to fairly normal activity in a timely fashion. Also gives the best chance of clearing up the infection and protecting the rest of her foot.  Principal Problem:   Toe osteomyelitis, left (HCC) Active Problems:   Preop cardiovascular exam   Chronic systolic heart failure (HCC)   Persistent atrial fibrillation (HCC)   CKD (chronic kidney disease), stage III   Anemia   Hypokalemia     Family Communication: Plan discussed with patient and family.  Perry Mount M.D on 09/22/2016 at 10:20 AM  Thank you for the consult, we will follow the patient with you in the Hospital.

## 2016-09-22 NOTE — Consult Note (Signed)
Cardiology Consultation Note  Patient ID: Meghan [name], MRN: [id number], DOB/AGE: 1950-11-05 66 y.o. Admit date: 09/21/2016   Date of Consult: 09/22/2016 Primary Physician: Patient, No Pcp Per Primary Cardiologist: Dr. Fletcher Anon, MD Requesting Physician: Dr. Benjie Karvonen, MD  Chief Complaint: Worsening left great toe osteomyelitis  Reason for Consult: Cardiac pre-operative evaluation  HPI: Meghan [name] is a 66 y.o. female who is being seen today for the evaluation of pre-operative cardiac risk at the request of Dr. Benjie Karvonen, MD. Patient has a h/o cardiomyopathy presumed to be nonischemic in the setting of Afib with RVR, persistent Afib on Coumadin, poorly controlled DM with diabetic neuropathy and ulcers, HLD, carotid artery disease s/p LICA endarterectomy, prior CVA, and morbid obesity who was admitted to [facility] qwith left great toe osteomyelitis requiring amputation.   She was evaluated in 2013 initially in the setting of acute CVA. A 2D echo was performed revealing EF 45-50% and anterior HK, severe apical HK, impaired LV relaxation, mild LA dilatation noted at that time. She subsequently underwent Lexiscan Myoview confirming prior anterior infarct, no evidence of ischemia. EF 41%. She was treated medically with ASA, BB, statin. She followed up 04/2012. No anginal or CHF type symptoms. Declined outpatient cath.  She had atrial fibrillation in the setting of acute cholecystitis in March 2015. Echocardiogram while hospitalized showed an ejection fraction of 35-40%.   She presented recently with heart failure symptoms in the setting of atrial fibrillation with rapid ventricular response. She was hospitalized at [facility] and improved with IV diuresis. She underwent an echocardiogram which showed a drop in LV systolic function with an EF of 25-30% with severe hypokinesis of the mid apical anteroseptal, anterior and apical myocardium. Echo was performed in the setting of Afib. The patient was started on amiodarone and underwent  successful cardioversion to normal sinus rhythm. In follow up on 4/26 she was back in Afib. Repeat DCCV was scheduled for 4/30. INR was therapeutic. Upon arrival to specials for repeat DCCV on 4/30 she was noted to be back in NSR and procedure was cancelled. She was volume overloaded and she briefly diuresed. She was most recently seen on 09/18/16 by Dr. Fletcher Anon and was doing well from a cardiac standpoint at that time. She remained in sinus rhythm. It was felt she would require repeat echo in a few months of her being in sinus rhythm and if her EF remained low at that time she would need a cath.   She been seeing podiatry for evaluation of diabetic foot ulcer of the left great toe that has now worsened leading to need of amputation today. Cardiology is asked to evaluate pre-operatively.  From a cardiac standpoint she has been doing well. When she is in Afib she cannot feel any palpitations nor does she have any SOB. No symptoms concerning for chest pain. Remains on Coumadin with most recent INR of 2.5 from 6/14. No vitamin K administered to date. No EKG for review at time of cardiology consult. Not on telemetry. Admission weight of 249 which was stable compared to office visit. Most recent weight of 244 pounds. She has been started on per-operative IV fluids at 50 cc/hr. She does not have any complaints at this time.     Past Medical History:  Diagnosis Date  . Cardiomyopathy ([place])    a. 06/2013 Echo: EF 35-40%, glob HK, mildly dil LA, mild LVH, mild TR, mild to mod MR.  . Carotid arterial disease ([place])    a. 02/2012 s/p L CEA.  Marland Kitchen  Coronary artery disease    a. Presumed - 02/2012 Lexi MV: Anterior, anteroseptal, septal, apical infarct with mild peri-infarct ischemia, EF 41%- medically managed.  . 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 Hyperlipidemia   . Hypertension   . Morbid obesity ([place])   . Stroke [facility]) 2013      Most Recent Cardiac Studies: 03/2012 Myoview: Abnormal  study. Evidence of prior anterior, anteroseptal, and apical infarct with mild peri-infarct ischemia. Mildly reduced LVSF at 41%.  07/2016 Echo: Study Conclusions  - Left ventricle: The cavity size was normal. There was mild   concentric hypertrophy. Systolic function was severely reduced.   The estimated ejection fraction was in the range of 25% to 30%.   There is severe hypokinesis of the mid-apicalanteroseptal,   anterior, and apical myocardium. - Mitral valve: Calcified annulus. There was mild regurgitation. - Left atrium: The atrium was severely dilated. - Right atrium: The atrium was moderately dilated. - Tricuspid valve: There was moderate regurgitation. - Pulmonary arteries: Systolic pressure was mildly increased. PA   peak pressure: 40 mm Hg (S).   Surgical History:  Past Surgical History:  Procedure Laterality Date  . CARDIOVERSION N/A 07/28/2016   Procedure: CARDIOVERSION;  Surgeon: Wellington Hampshire, MD;  Location: [facility] ORS;  Service: Cardiovascular;  Laterality: N/A;  . CARDIOVERSION N/A 08/07/2016   Procedure: CARDIOVERSION;  Surgeon: Wellington Hampshire, MD;  Location: [facility] ORS;  Service: Cardiovascular;  Laterality: N/A;  . CAROTID ENDARTERECTOMY     [facility]; Dr. Lucky Cowboy  . CHOLECYSTECTOMY    . LEG SURGERY     right;infection     Home Meds: Prior to Admission medications   Medication Sig Start Date End Date Taking? Authorizing Provider  amiodarone (PACERONE) 200 MG tablet Take 1 tablet (200 mg total) by mouth daily. 09/18/16  Yes Wellington Hampshire, MD  atorvastatin (LIPITOR) 10 MG tablet TAKE ONE TABLET BY MOUTH ONCE DAILY 08/03/15  Yes Wellington Hampshire, MD  furosemide (LASIX) 40 MG tablet Take 1 tablet (40 mg total) by mouth daily. 08/25/16 11/23/16 Yes Wellington Hampshire, MD  glipiZIDE (GLUCOTROL) 10 MG tablet Take 10 mg by mouth 2 (two) times daily. 08/13/15  Yes [provider]  lisinopril (PRINIVIL,ZESTRIL) 10 MG tablet Take 1 tablet (10 mg total) by mouth daily.  08/17/16 11/15/16 Yes Wellington Hampshire, MD  metFORMIN (GLUCOPHAGE) 500 MG tablet Take 500 mg by mouth 2 (two) times daily.  06/18/16  Yes [provider]  metoprolol succinate (TOPROL-XL) 100 MG 24 hr tablet Take 1 tablet (100 mg total) by mouth daily. Take with or immediately following a meal. 08/17/16  Yes Wellington Hampshire, MD  spironolactone (ALDACTONE) 25 MG tablet Take 1 tablet (25 mg total) by mouth daily. 09/18/16 12/17/16 Yes Wellington Hampshire, MD  warfarin (COUMADIN) 5 MG tablet Take 1 tablet (5 mg total) by mouth one time only at 6 PM. Start from Sunday April 22nd 08/25/16  Yes Wellington Hampshire, MD    Inpatient Medications:  . amiodarone  200 mg Oral Daily  . atorvastatin  10 mg Oral Daily  . docusate sodium  100 mg Oral BID  . insulin aspart  0-15 Units Subcutaneous TID WC  . insulin aspart  0-5 Units Subcutaneous QHS  . lisinopril  10 mg Oral Daily  . metoprolol succinate  50 mg Oral Daily  . potassium chloride  40 mEq Oral Q4H  . spironolactone  25 mg Oral Daily   .  sodium chloride 50 mL/hr at 09/22/16 0805  . piperacillin-tazobactam (ZOSYN)  IV Stopped (09/22/16 0506)  . vancomycin      Allergies: No Known Allergies  Social History   Social History  . Marital status: Widowed    Spouse name: N/A  . Number of children: N/A  . Years of education: N/A   Occupational History  . Not on file.   Social History Main Topics  . Smoking status: Former Smoker    Packs/day: 0.50    Years: 5.00    Types: Cigarettes  . Smokeless tobacco: Never Used  . Alcohol use No  . Drug use: No  . Sexual activity: Not on file   Other Topics Concern  . Not on file   Social History Narrative  . No narrative on file     Family History  Problem Relation Age of Onset  . Heart disease Mother      Review of Systems: Review of Systems  Constitutional: Positive for malaise/fatigue. Negative for chills, diaphoresis, fever and weight loss.  HENT: Negative for congestion.    Eyes: Negative for discharge and redness.  Respiratory: Negative for cough, hemoptysis, sputum production, shortness of breath and wheezing.   Cardiovascular: Negative for chest pain, palpitations, orthopnea, claudication, leg swelling and PND.  Gastrointestinal: Negative for abdominal pain, blood in stool, heartburn, melena, nausea and vomiting.  Genitourinary: Negative for hematuria.  Musculoskeletal: Negative for falls and myalgias.       No left great toe pain  Skin: Negative for rash.  Neurological: Negative for dizziness, tingling, tremors, sensory change, speech change, focal weakness, loss of consciousness and weakness.  Endo/Heme/Allergies: Does not bruise/bleed easily.  Psychiatric/Behavioral: Negative for substance abuse. The patient is not nervous/anxious.   All other systems reviewed and are negative.   Labs: No results for input(s): CKTOTAL, CKMB, TROPONINI in the last 72 hours. Lab Results  Component Value Date   WBC 9.3 09/22/2016   HGB 8.1 (L) 09/22/2016   HCT 25.9 (L) 09/22/2016   MCV 70.4 (L) 09/22/2016   PLT 365 09/22/2016     Recent Labs Lab 09/22/16 0402  NA 135  K 3.2*  CL 99*  CO2 28  BUN 17  CREATININE 1.29*  CALCIUM 7.9*  GLUCOSE 151*   Lab Results  Component Value Date   CHOL 165 02/14/2012   HDL 31 (L) 02/14/2012   LDLCALC 100 02/14/2012   TRIG 168 02/14/2012   No results found for: DDIMER  Radiology/Studies:  Mr Foot Left Wo Contrast  Result Date: 09/21/2016 CLINICAL DATA:  Osteomyelitis of the left great toe. Diabetes. Ulceration. Swelling and erythema. EXAM: MRI OF THE LEFT FOOT WITHOUT CONTRAST TECHNIQUE: Multiplanar, multisequence MR imaging of the left forefoot was performed. No intravenous contrast was administered. COMPARISON:  None. FINDINGS: Bones/Joint/Cartilage MRI confirms abnormal osseous edema in the proximal and distal phalanges of the great toe compatible with osteomyelitis. Cortical demineralization/ destruction in the  distal phalanx compatible with more advanced osteomyelitis. Suspected transverse fracture of the distal phalanx based on the conventional radiographs. No other regions of osteomyelitis are identified in the forefoot. There is considerable degenerative arthropathy at the Lisfranc joint without malalignment. Type 2 accessory navicular, without edema. Degenerative midfoot arthropathy. Ligaments The Lisfranc ligament appears intact on image 8/7. Muscles and Tendons Low-level edema tracks within the plantar musculature of the foot in a generalized fashion. Atrophic musculature of the forefoot. Soft tissues Dorsal subcutaneous edema tracking along the forefoot. Plantar ulceration below the head of the first  metatarsal on image 23/4. Extensive subcutaneous edema in the great toe with a less degree of edema in the second toe. IMPRESSION: 1. Osteomyelitis of the proximal and distal phalanges of the great toe with cortical demineralization in destruction of parts of the distal phalanx. No drainable abscess. 2. Dorsal subcutaneous edema in the forefoot, cellulitis not excluded. There is low-grade edema tracking along the atrophic musculature of the plantar foot, which could reflect low grade myositis. No drainable abscess observed. 3. Plantar ulceration below the head of the first metatarsal. 4. Degenerative midfoot and Lisfranc joint arthropathy. Lisfranc ligament intact. Electronically Signed   By: Van Clines M.D.   On: 09/21/2016 22:27   Dg Toe Great Left  Result Date: 09/21/2016 CLINICAL DATA:  Swelling and erythema of the left great toe with yellowish drainage and foul odor. EXAM: LEFT GREAT TOE COMPARISON:  None. FINDINGS: Abnormal appearance of the base of the left distal phalanx were there is bone destruction, osteopenia and bony sequestra noted in the adjacent soft tissues. Nondisplaced fracture lucency is also seen along its midportion with an intra-articular fracture along the base of the phalanx  medially. The soft tissues are edematous in swelling with soft tissue ulceration noted along the medial aspect of the great toe. Osteoarthritic joint space narrowing is seen of the interphalangeal joint of the great toe. No frank bone destruction is apparent of the adjacent first proximal phalanx though there is suggestion of mild periosteal new bone formation along its medial aspect. IMPRESSION: 1. Findings in keeping with osteomyelitis of the first distal phalangeal base with bony sequestra in the adjacent medial soft tissues. There is overlying soft tissue swelling with ulceration of the great toe. 2. Nondisplaced fracture involving the midportion of the left distal phalanx. There also appears to be an intra-articular extension of fracture the medial aspect of the distal phalanx at the interphalangeal joint. 3. Subtle periosteal new bone formation is also seen along the medial aspect of the distal first proximal phalanx which may also reflect subtle changes of osteomyelitis. Electronically Signed   By: Ashley Royalty M.D.   On: 09/21/2016 18:02    EKG: Interpreted by me showed: Ordered at time of cardiology consult showed - NSR, 64 bpm, TWI aVL Telemetry: Interpreted by me showed: not on telemetry  Weights: Filed Weights   09/21/16 1436 09/21/16 2053  Weight: 249 lb (112.9 kg) 244 lb 11.2 oz (111 kg)     Physical Exam: Blood pressure 130/72, pulse (!) 58, temperature 98.6 F (37 C), temperature source Oral, resp. rate 16, height 5\' 7"  (1.702 m), weight 244 lb 11.2 oz (111 kg), SpO2 95 %. Body mass index is 38.33 kg/m. General: Well developed, well nourished, in no acute distress. Head: Normocephalic, atraumatic, sclera non-icteric, no xanthomas, nares are without discharge.  Neck: Left carotid bruit noted. JVD not elevated. Lungs: Clear bilaterally to auscultation without wheezes, rales, or rhonchi. Breathing is unlabored. Heart: RRR with S1 S2. II/VI systolic murmur, no rubs, or gallops  appreciated. Abdomen: Soft, non-tender, non-distended with normoactive bowel sounds. No hepatomegaly. No rebound/guarding. No obvious abdominal masses. Msk:  Strength and tone appear normal for age. Extremities: No clubbing or cyanosis. No edema. Distal pedal pulses are 2+ and equal bilaterally. Left great toe bandaged with purulent drainage noted along with mild surrounding erythema.  Neuro: Alert and oriented X 3. No facial asymmetry. No focal deficit. Moves all extremities spontaneously. Psych:  Responds to questions appropriately with a normal affect.    Assessment and Plan:  Principal Problem:   Toe osteomyelitis, left (HCC) Active Problems:   Preop cardiovascular exam   Chronic systolic heart failure (HCC)   Persistent atrial fibrillation (HCC)   Anemia   Hypokalemia   CKD (chronic kidney disease), stage III    1. Pre-operative cardiac evaluation: -PREOPERATIVE CARDIAC RISK ASSESSMENT:   Revised Cardiac Risk Index:  High Risk Surgery (defined as Intraperitoneal, intrathoracic or suprainguinal vascular): left great toe amputation; (moderate risk surgery)  Active CAD: no  CHF: yes  Cerebrovascular Disease: yes  Diabetes: yes; On Insulin: no  CKD (Cr >~ 2): no   Total: 2 Estimated Risk of Adverse Outcome: moderate to high risk risk for moderate risk surgery. Estimated Risk of MI, PE, VF/VT (Cardiac Arrest), Complete Heart Block: 6.6%   ACC/AHA Guidelines for "Clearance":  Step 1 - Need for Emergency Surgery: no  If Yes - go straight to OR with perioperative surveillance  Step 2 - Active Cardiac Conditions (Unstable Angina, Decompensated HF, Significant  Arrhytmias - Complete HB, Mobitz II, Symptomatic VT or SVT, Severe Aortic Stenosis - mean gradient > 40 mmHg, Valve area < 1.0 cm2): no  If Yes - Evaluate & Treat per ACC/AHA Guidelines  Step 3 -  Low Risk Surgery: no  If Yes --> proceed to OR  If No --> Step 4  Step 4 - Functional Capacity >= 4 METS  without symptoms: yes  If Yes --> proceed to OR  If No --> Step 5  Step 5 --  Clinical Risk Factors (CRF)  - Zero --> proceed to OR  -She would be a moderate to high risk for non-cardiac surgery as above -No further cardiac testing would decrease her peri-operative risk at this time  2. Chronic systolic CHF: -She does not appear to be grossly volume overloaded at this time -Her noted worsening EF in 07/2016 by echo in the setting of Afib with RVR maybe falsely low in this setting -Will need repeat echo in a couple of months, no indication to repeat at this time -Would limit peri-operative IV fluids given her CM -Continue spironolactone, lisinopril, Toprol and Lasix -Strict Is and Os with daily weights  3. Persistent Afib: -Remains in sinus rhythm with a heart rate in the 60 bpm currently -No prior issues with bradycardic rates on Toprol XL -Monitor heart rate on current dose of BB, decrease if needed -Place on telemetry -On Coumadin with INR 2.5 as of 6/14, defer management to primary service given her pending surgery -CHADS2VASc at least 8 (CHF, HTN, age x 1, DM, stroke x 2, vascular disease, female)  4. Osteomyelitis: -Per primary  5. Hypokalemia: -Receiving PO KCl -Also on spironolactone and lisinopril -Monitor -Obtain goal > 4.0 -Check magnesium with recommendation to replete to goal of at least 2.0 as indicated  6. Hypomagnesemia: -Noted to be low on 6/14 -Not repleted -Will replete via IV magnesium at this time  7. Iron deficiency anemia vs anemia of chronic disease: -Maintain hgb > 8.5-10   Signed, Marcille Blanco [place] Pager: [phone] 09/22/2016, 9:43 AM   Attending Note Patient seen and examined, agree with detailed note above,  Patient presentation and plan discussed on rounds.  Cardiology consult placed by Dr.  Benjie Karvonen for preoperative cardiovascular evaluation, osteomyelitis, history of atrial fibrillation  EKG lab work, chest x-ray   reviewed independently by myself  66 year old woman known to our clinic with history of paroxysmal atrial fibrillation, poor control diabetes, carotid disease with endarterectomy on the left, cardiomyopathy in the  setting of atrial fibrillation who presents with osteomyelitis left foot great toe, plan for dictation today  Family present at the bedside, Family denies any significant shortness of breath, chest pain Denies any tachycardia concerning for arrhythmia since her previous cardioversion several weeks ago Compliant with her anticoagulation.   Previous ischemic workup December 2013 stress test at that time Echocardiogram April 2018   On physical exam she is comfortable, obese, supine in bed lungs are clear to auscultation bilaterally, no JVD, heart sounds regular with normal S1-S2, abdomen soft nontender, no significant lower extremity edema, trace left  foot edema,  bandage over left great toe  Lab work reviewed showing potassium 3.2, creatinine 1.29, iron of 15, hemoglobin 26  EKG personally reviewed by myself showing normal sinus rhythm with no significant ST or T-wave changes  ----Preop cardiovascular Acceptable risk for surgery Depressed ejection fraction noted previously in the setting of atrial fibrillation Now normal sinus rhythm No new symptoms concerning for unstable angina Blood pressure heart rate stable No further testing needed  ----Paroxysmal atrial fibrillation We'll continue on low-dose amiodarone and metoprolol at current dose Potassium supplementation  ----Anemia Appears to be iron deficient She may benefit from iron infusion  Case discussed with nursing, family, Dr. Elvina Mattes Greater than 50% was spent in counseling and coordination of care with patient Total encounter time 110 minutes or more   Signed: Esmond Plants  M.D., Ph.D. [facility]

## 2016-09-22 NOTE — Consult Note (Signed)
[facility] Infectious Disease     Reason for Consult:DM foot infection    Referring Physician: Bettey Costa  Date of Admission:  09/21/2016   Principal Problem:   Toe osteomyelitis, left (HCC) Active Problems:   Preop cardiovascular exam   Chronic systolic heart failure (HCC)   Persistent atrial fibrillation (HCC)   CKD (chronic kidney disease), stage III   Anemia   Hypokalemia   HPI: Meghan [name] is a 66 y.o. female admitted with infected L great toe ulcer worsening over 1-3 weeks. On admit had draining pus, wbc 9 and no fevers. Xrays showed osteomyelitis of great toe. Underwent surgery 5.15 with amputation of L great toe to 1st MTP joint. Site primarly closed. Cultures pending but GS mixed.  Past Medical History:  Diagnosis Date  . Atrial fibrillation ([place])   . Cardiomyopathy ([place])    a. 06/2013 Echo: EF 35-40%, glob HK, mildly dil LA, mild LVH, mild TR, mild to mod MR.  . Carotid arterial disease ([place])    a. 02/2012 s/p L CEA.  . Coronary artery disease    a. Presumed - 02/2012 [place] MV: Anterior, anteroseptal, septal, apical infarct with mild peri-infarct ischemia, EF 41%- medically managed.  . 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 Hyperlipidemia   . Hypertension   . Morbid obesity ([place])   . Stroke [facility]) 2013   Past Surgical History:  Procedure Laterality Date  . AMPUTATION TOE Left 09/22/2016   Procedure: AMPUTATION TOE;  Surgeon: Albertine Patricia, DPM;  Location: [facility] ORS;  Service: Podiatry;  Laterality: Left;  . CARDIOVERSION N/A 07/28/2016   Procedure: CARDIOVERSION;  Surgeon: Wellington Hampshire, MD;  Location: [facility] ORS;  Service: Cardiovascular;  Laterality: N/A;  . CARDIOVERSION N/A 08/07/2016   Procedure: CARDIOVERSION;  Surgeon: Wellington Hampshire, MD;  Location: [facility] ORS;  Service: Cardiovascular;  Laterality: N/A;  . CAROTID ENDARTERECTOMY     [facility]; Dr. Lucky Cowboy  . CHOLECYSTECTOMY    . LEG SURGERY     right;infection  . TONSILLECTOMY      Social History  Substance Use Topics  . Smoking status: Former Smoker    Packs/day: 0.50    Years: 5.00    Types: Cigarettes  . Smokeless tobacco: Never Used  . Alcohol use No   Family History  Problem Relation Age of Onset  . Heart disease Mother     Allergies: No Known Allergies  Current antibiotics: Antibiotics Given (last 72 hours)    Date/Time Action Medication Dose Rate   09/21/16 1833 New Bag/Given   vancomycin (VANCOCIN) IVPB 1000 mg/200 mL premix 1,000 mg 200 mL/hr   09/21/16 2014 New Bag/Given   piperacillin-tazobactam (ZOSYN) IVPB 3.375 g 3.375 g 100 mL/hr   09/21/16 2359 New Bag/Given   vancomycin (VANCOCIN) IVPB 1000 mg/200 mL premix 1,000 mg 200 mL/hr   09/22/16 0106 New Bag/Given   piperacillin-tazobactam (ZOSYN) IVPB 3.375 g 3.375 g 12.5 mL/hr   09/22/16 1010 New Bag/Given   piperacillin-tazobactam (ZOSYN) IVPB 3.375 g 3.375 g 12.5 mL/hr      MEDICATIONS: . amiodarone  200 mg Oral Daily  . atorvastatin  10 mg Oral Daily  . docusate sodium  100 mg Oral BID  . insulin aspart  0-15 Units Subcutaneous TID WC  . insulin aspart  0-5 Units Subcutaneous QHS  . lisinopril  10 mg Oral Daily  . metoprolol succinate  100 mg Oral Daily  . spironolactone  25 mg Oral Daily    Review  of Systems - 11 systems reviewed and negative per HPI   OBJECTIVE: Temp:  [97 F (36.1 C)-98.6 F (37 C)] 97.9 F (36.6 C) (06/15 1716) Pulse Rate:  [56-68] 56 (06/15 1716) Resp:  [14-21] 18 (06/15 1716) BP: (111-149)/(60-84) 111/69 (06/15 1716) SpO2:  [89 %-99 %] 92 % (06/15 1716) Weight:  [110.7 kg (244 lb)-111 kg (244 lb 11.2 oz)] 110.7 kg (244 lb) (06/15 1126) Physical Exam  Constitutional:  oriented to person, place, and time. Obese HENT: [place]/AT, PERRLA, no scleral icterus Mouth/Throat: Oropharynx is clear and moist. No oropharyngeal exudate.  Cardiovascular: Normal rate, regular rhythm and normal heart sounds. Exam reveals no gallop and no friction rub.  No murmur  heard.  Pulmonary/Chest: Effort normal and breath sounds normal. No respiratory distress.  has no wheezes.  Neck = supple, no nuchal rigidity Abdominal: Soft. Bowel sounds are normal.  exhibits no distension. There is no tenderness.  Lymphadenopathy: no cervical adenopathy. No axillary adenopathy Neurological: alert and oriented to person, place, and time.  Skin: L foot wrapped Psychiatric: a normal mood and affect.  behavior is normal.    LABS: Results for orders placed or performed during the hospital encounter of 09/21/16 (from the past 48 hour(s))  CBC     Status: Abnormal   Collection Time: 09/21/16  2:52 PM  Result Value Ref Range   WBC 7.9 3.6 - 11.0 K/uL   RBC 3.95 3.80 - 5.20 MIL/uL   Hemoglobin 8.9 (L) 12.0 - 16.0 g/dL   HCT 28.5 (L) 35.0 - 47.0 %   MCV 72.1 (L) 80.0 - 100.0 fL   MCH 22.5 (L) 26.0 - 34.0 pg   MCHC 31.2 (L) 32.0 - 36.0 g/dL   RDW 21.8 (H) 11.5 - 14.5 %   Platelets 390 150 - 440 K/uL  Basic metabolic panel     Status: Abnormal   Collection Time: 09/21/16  2:52 PM  Result Value Ref Range   Sodium 132 (L) 135 - 145 mmol/L   Potassium 2.8 (L) 3.5 - 5.1 mmol/L   Chloride 96 (L) 101 - 111 mmol/L   CO2 27 22 - 32 mmol/L   Glucose, Bld 142 (H) 65 - 99 mg/dL   BUN 15 6 - 20 mg/dL   Creatinine, Ser 1.16 (H) 0.44 - 1.00 mg/dL   Calcium 8.2 (L) 8.9 - 10.3 mg/dL   GFR calc non Af Amer 47 (L) >60 mL/min   GFR calc Af Amer 55 (L) >60 mL/min    Comment: (NOTE) The eGFR has been calculated using the CKD EPI equation. This calculation has not been validated in all clinical situations. eGFR's persistently <60 mL/min signify possible Chronic Kidney Disease.    Anion gap 9 5 - 15  Magnesium     Status: Abnormal   Collection Time: 09/21/16  2:52 PM  Result Value Ref Range   Magnesium 1.3 (L) 1.7 - 2.4 mg/dL  Aerobic Culture (superficial specimen)     Status: None (Preliminary result)   Collection Time: 09/21/16  8:17 PM  Result Value Ref Range   Specimen  Description TOE LEFT    Special Requests NONE    Gram Stain      MODERATE WBC PRESENT, PREDOMINANTLY PMN ABUNDANT GRAM POSITIVE COCCI IN PAIRS IN CHAINS ABUNDANT GRAM NEGATIVE RODS FEW GRAM POSITIVE RODS Performed at [facility], [place] [address], [place], [place] [address]    Culture PENDING    Report Status PENDING   CULTURE, BLOOD (ROUTINE X 2)  w Reflex to ID Panel     Status: None (Preliminary result)   Collection Time: 09/21/16  8:17 PM  Result Value Ref Range   Specimen Description BLOOD LAC    Special Requests      BOTTLES DRAWN AEROBIC AND ANAEROBIC Blood Culture adequate volume   Culture NO GROWTH < 12 HOURS    Report Status PENDING   CULTURE, BLOOD (ROUTINE X 2) w Reflex to ID Panel     Status: None (Preliminary result)   Collection Time: 09/21/16  8:17 PM  Result Value Ref Range   Specimen Description BLOOD LFOA    Special Requests      BOTTLES DRAWN AEROBIC AND ANAEROBIC Blood Culture adequate volume   Culture NO GROWTH < 12 HOURS    Report Status PENDING   Glucose, capillary     Status: Abnormal   Collection Time: 09/21/16  9:15 PM  Result Value Ref Range   Glucose-Capillary 161 (H) 65 - 99 mg/dL  Protime-INR     Status: Abnormal   Collection Time: 09/21/16 10:49 PM  Result Value Ref Range   Prothrombin Time 28.2 (H) 11.4 - 15.2 seconds   INR 9.52   Basic metabolic panel     Status: Abnormal   Collection Time: 09/22/16  4:02 AM  Result Value Ref Range   Sodium 135 135 - 145 mmol/L   Potassium 3.2 (L) 3.5 - 5.1 mmol/L   Chloride 99 (L) 101 - 111 mmol/L   CO2 28 22 - 32 mmol/L   Glucose, Bld 151 (H) 65 - 99 mg/dL   BUN 17 6 - 20 mg/dL   Creatinine, Ser 1.29 (H) 0.44 - 1.00 mg/dL   Calcium 7.9 (L) 8.9 - 10.3 mg/dL   GFR calc non Af Amer 42 (L) >60 mL/min   GFR calc Af Amer 48 (L) >60 mL/min    Comment: (NOTE) The eGFR has been calculated using the CKD EPI equation. This calculation has not been validated in all clinical situations. eGFR's  persistently <60 mL/min signify possible Chronic Kidney Disease.    Anion gap 8 5 - 15  CBC     Status: Abnormal   Collection Time: 09/22/16  4:02 AM  Result Value Ref Range   WBC 9.3 3.6 - 11.0 K/uL   RBC 3.68 (L) 3.80 - 5.20 MIL/uL   Hemoglobin 8.1 (L) 12.0 - 16.0 g/dL   HCT 25.9 (L) 35.0 - 47.0 %   MCV 70.4 (L) 80.0 - 100.0 fL   MCH 22.0 (L) 26.0 - 34.0 pg   MCHC 31.3 (L) 32.0 - 36.0 g/dL   RDW 21.5 (H) 11.5 - 14.5 %   Platelets 365 150 - 440 K/uL  Vitamin B12     Status: None   Collection Time: 09/22/16  7:13 AM  Result Value Ref Range   Vitamin B-12 372 180 - 914 pg/mL    Comment: (NOTE) This assay is not validated for testing neonatal or myeloproliferative syndrome specimens for Vitamin B12 levels. Performed at [facility], [place] [address] [place], [place] [address]   Folate     Status: None   Collection Time: 09/22/16  7:13 AM  Result Value Ref Range   Folate 12.6 >5.9 ng/mL  Iron and TIBC     Status: Abnormal   Collection Time: 09/22/16  7:13 AM  Result Value Ref Range   Iron 15 (L) 28 - 170 ug/dL   TIBC 370 250 - 450 ug/dL   Saturation Ratios  4 (L) 10.4 - 31.8 %   UIBC 355 ug/dL  Ferritin     Status: None   Collection Time: 09/22/16  7:13 AM  Result Value Ref Range   Ferritin 13 11 - 307 ng/mL  Reticulocytes     Status: Abnormal   Collection Time: 09/22/16  7:13 AM  Result Value Ref Range   Retic Ct Pct 2.5 0.4 - 3.1 %   RBC. 3.58 (L) 3.80 - 5.20 MIL/uL   Retic Count, Manual 89.5 19.0 - 183.0 K/uL  Glucose, capillary     Status: Abnormal   Collection Time: 09/22/16  7:26 AM  Result Value Ref Range   Glucose-Capillary 114 (H) 65 - 99 mg/dL  Glucose, capillary     Status: None   Collection Time: 09/22/16 11:33 AM  Result Value Ref Range   Glucose-Capillary 99 65 - 99 mg/dL  Anaerobic culture     Status: None (Preliminary result)   Collection Time: 09/22/16 12:27 PM  Result Value Ref Range   Specimen Description BONE left great toe    Special  Requests NONE    Gram Stain      ABUNDANT WBC PRESENT, PREDOMINANTLY PMN MODERATE GRAM POSITIVE RODS FEW GRAM NEGATIVE RODS FEW GRAM POSITIVE COCCI IN PAIRS Performed at [facility], [place] [address] [place], [place] [address]    Culture PENDING    Report Status PENDING   Glucose, capillary     Status: None   Collection Time: 09/22/16  1:19 PM  Result Value Ref Range   Glucose-Capillary 99 65 - 99 mg/dL   Comment 1 Notify RN    Comment 2 Document in Chart   Glucose, capillary     Status: Abnormal   Collection Time: 09/22/16  4:04 PM  Result Value Ref Range   Glucose-Capillary 122 (H) 65 - 99 mg/dL   No components found for: ESR, C REACTIVE PROTEIN MICRO: Recent Results (from the past 720 hour(s))  Aerobic Culture (superficial specimen)     Status: None (Preliminary result)   Collection Time: 09/21/16  8:17 PM  Result Value Ref Range Status   Specimen Description TOE LEFT  Final   Special Requests NONE  Final   Gram Stain   Final    MODERATE WBC PRESENT, PREDOMINANTLY PMN ABUNDANT GRAM POSITIVE COCCI IN PAIRS IN CHAINS ABUNDANT GRAM NEGATIVE RODS FEW GRAM POSITIVE RODS Performed at [facility], [place] [address], [place], [place] [address]    Culture PENDING  Incomplete   Report Status PENDING  Incomplete  CULTURE, BLOOD (ROUTINE X 2) w Reflex to ID Panel     Status: None (Preliminary result)   Collection Time: 09/21/16  8:17 PM  Result Value Ref Range Status   Specimen Description BLOOD LAC  Final   Special Requests   Final    BOTTLES DRAWN AEROBIC AND ANAEROBIC Blood Culture adequate volume   Culture NO GROWTH < 12 HOURS  Final   Report Status PENDING  Incomplete  CULTURE, BLOOD (ROUTINE X 2) w Reflex to ID Panel     Status: None (Preliminary result)   Collection Time: 09/21/16  8:17 PM  Result Value Ref Range Status   Specimen Description BLOOD LFOA  Final   Special Requests   Final    BOTTLES DRAWN AEROBIC AND ANAEROBIC Blood Culture adequate volume    Culture NO GROWTH < 12 HOURS  Final   Report Status PENDING  Incomplete  Anaerobic culture     Status: None (  Preliminary result)   Collection Time: 09/22/16 12:27 PM  Result Value Ref Range Status   Specimen Description BONE left great toe  Final   Special Requests NONE  Final   Gram Stain   Final    ABUNDANT WBC PRESENT, PREDOMINANTLY PMN MODERATE GRAM POSITIVE RODS FEW GRAM NEGATIVE RODS FEW GRAM POSITIVE COCCI IN PAIRS Performed at [facility], [place] [address] [place], [place] [address]    Culture PENDING  Incomplete   Report Status PENDING  Incomplete    IMAGING: Mr Foot Left Wo Contrast  Result Date: 09/21/2016 CLINICAL DATA:  Osteomyelitis of the left great toe. Diabetes. Ulceration. Swelling and erythema. EXAM: MRI OF THE LEFT FOOT WITHOUT CONTRAST TECHNIQUE: Multiplanar, multisequence MR imaging of the left forefoot was performed. No intravenous contrast was administered. COMPARISON:  None. FINDINGS: Bones/Joint/Cartilage MRI confirms abnormal osseous edema in the proximal and distal phalanges of the great toe compatible with osteomyelitis. Cortical demineralization/ destruction in the distal phalanx compatible with more advanced osteomyelitis. Suspected transverse fracture of the distal phalanx based on the conventional radiographs. No other regions of osteomyelitis are identified in the forefoot. There is considerable degenerative arthropathy at the Lisfranc joint without malalignment. Type 2 accessory navicular, without edema. Degenerative midfoot arthropathy. Ligaments The Lisfranc ligament appears intact on image 8/7. Muscles and Tendons Low-level edema tracks within the plantar musculature of the foot in a generalized fashion. Atrophic musculature of the forefoot. Soft tissues Dorsal subcutaneous edema tracking along the forefoot. Plantar ulceration below the head of the first metatarsal on image 23/4. Extensive subcutaneous edema in the great toe with a less degree of edema  in the second toe. IMPRESSION: 1. Osteomyelitis of the proximal and distal phalanges of the great toe with cortical demineralization in destruction of parts of the distal phalanx. No drainable abscess. 2. Dorsal subcutaneous edema in the forefoot, cellulitis not excluded. There is low-grade edema tracking along the atrophic musculature of the plantar foot, which could reflect low grade myositis. No drainable abscess observed. 3. Plantar ulceration below the head of the first metatarsal. 4. Degenerative midfoot and Lisfranc joint arthropathy. Lisfranc ligament intact. Electronically Signed   By: Van Clines M.D.   On: 09/21/2016 22:27   Dg Toe Great Left  Result Date: 09/21/2016 CLINICAL DATA:  Swelling and erythema of the left great toe with yellowish drainage and foul odor. EXAM: LEFT GREAT TOE COMPARISON:  None. FINDINGS: Abnormal appearance of the base of the left distal phalanx were there is bone destruction, osteopenia and bony sequestra noted in the adjacent soft tissues. Nondisplaced fracture lucency is also seen along its midportion with an intra-articular fracture along the base of the phalanx medially. The soft tissues are edematous in swelling with soft tissue ulceration noted along the medial aspect of the great toe. Osteoarthritic joint space narrowing is seen of the interphalangeal joint of the great toe. No frank bone destruction is apparent of the adjacent first proximal phalanx though there is suggestion of mild periosteal new bone formation along its medial aspect. IMPRESSION: 1. Findings in keeping with osteomyelitis of the first distal phalangeal base with bony sequestra in the adjacent medial soft tissues. There is overlying soft tissue swelling with ulceration of the great toe. 2. Nondisplaced fracture involving the midportion of the left distal phalanx. There also appears to be an intra-articular extension of fracture the medial aspect of the distal phalanx at the interphalangeal  joint. 3. Subtle periosteal new bone formation is also seen along the medial aspect  of the distal first proximal phalanx which may also reflect subtle changes of osteomyelitis. Electronically Signed   By: Ashley Royalty M.D.   On: 09/21/2016 18:02    Assessment:   Meghan [name] is a 66 y.o. female with DM foot ulcer and great toe osteomyelitis s/p toe amputation 5.15 but closed primarily and does not appear to have more proximal infection. Good bleeding was noted. Cultures pending. No abx allergies.  Can likely be treated with oral abx if podiatry agrees.   Recommendations Continue vanco and zosyn pending culture Will need to select abx for dc based on culture results. Thank you very much for allowing me to participate in the care of this patient. Please call with questions.   Cheral Marker. Ola Spurr, MD

## 2016-09-23 LAB — BASIC METABOLIC PANEL
Anion gap: 8 (ref 5–15)
BUN: 18 mg/dL (ref 6–20)
CHLORIDE: 100 mmol/L — AB (ref 101–111)
CO2: 28 mmol/L (ref 22–32)
CREATININE: 1.5 mg/dL — AB (ref 0.44–1.00)
Calcium: 8.4 mg/dL — ABNORMAL LOW (ref 8.9–10.3)
GFR calc Af Amer: 40 mL/min — ABNORMAL LOW (ref >60)
GFR calc non Af Amer: 35 mL/min — ABNORMAL LOW (ref >60)
GLUCOSE: 153 mg/dL — AB (ref 65–99)
POTASSIUM: 4.3 mmol/L (ref 3.5–5.1)
Sodium: 136 mmol/L (ref 135–145)

## 2016-09-23 LAB — CBC
HEMATOCRIT: 28.6 % — AB (ref 35.0–47.0)
HEMOGLOBIN: 8.9 g/dL — AB (ref 12.0–16.0)
MCH: 22.2 pg — AB (ref 26.0–34.0)
MCHC: 31.2 g/dL — AB (ref 32.0–36.0)
MCV: 71.2 fL — AB (ref 80.0–100.0)
Platelets: 463 10*3/uL — ABNORMAL HIGH (ref 150–440)
RBC: 4.02 MIL/uL (ref 3.80–5.20)
RDW: 21.7 % — ABNORMAL HIGH (ref 11.5–14.5)
WBC: 9.1 10*3/uL (ref 3.6–11.0)

## 2016-09-23 LAB — GLUCOSE, CAPILLARY
GLUCOSE-CAPILLARY: 184 mg/dL — AB (ref 65–99)
GLUCOSE-CAPILLARY: 234 mg/dL — AB (ref 65–99)
Glucose-Capillary: 145 mg/dL — ABNORMAL HIGH (ref 65–99)
Glucose-Capillary: 192 mg/dL — ABNORMAL HIGH (ref 65–99)

## 2016-09-23 LAB — PROTIME-INR
INR: 3.12
PROTHROMBIN TIME: 32.8 s — AB (ref 11.4–15.2)

## 2016-09-23 LAB — HEMOGLOBIN A1C
Hgb A1c MFr Bld: 7.4 % — ABNORMAL HIGH (ref 4.8–5.6)
Mean Plasma Glucose: 166 mg/dL

## 2016-09-23 MED ORDER — GLIPIZIDE 10 MG PO TABS
10.0000 mg | ORAL_TABLET | Freq: Two times a day (BID) | ORAL | Status: DC
  Administered 2016-09-23 (×2): 10 mg via ORAL
  Filled 2016-09-23 (×3): qty 1

## 2016-09-23 MED ORDER — WARFARIN SODIUM 3 MG PO TABS: 4.0000 mg | ORAL_TABLET | Freq: Once | ORAL | Status: DC

## 2016-09-23 MED ORDER — WARFARIN - PHARMACIST DOSING INPATIENT: Freq: Every day | Status: DC

## 2016-09-23 MED ORDER — WARFARIN SODIUM 3 MG PO TABS
3.0000 mg | ORAL_TABLET | Freq: Once | ORAL | Status: AC
  Administered 2016-09-23: 3 mg via ORAL
  Filled 2016-09-23: qty 1

## 2016-09-23 NOTE — Progress Notes (Addendum)
ANTICOAGULATION CONSULT NOTE - Initial Consult  Pharmacy Consult for Warfarin  Indication: atrial fibrillation  No Known Allergies  Patient Measurements: Height: 5\' 7"  (170.2 cm) Weight: 244 lb (110.7 kg) IBW/kg (Calculated) : 61.6  Vital Signs: Temp: 97.6 F (36.4 C) (06/16 0756) Temp Source: Oral (06/16 0756) BP: 143/70 (06/16 0756) Pulse Rate: 61 (06/16 0756)  Recent Labs  09/21/16 1452 09/21/16 2249 09/22/16 0402 09/23/16 0519  HGB 8.9*  --  8.1* 8.9*  HCT 28.5*  --  25.9* 28.6*  PLT 390  --  365 463*  LABPROT  --  28.2*  --  32.8*  INR  --  2.58  --  3.12  CREATININE 1.16*  --  1.29* 1.50*    Estimated Creatinine Clearance: 46 mL/min (A) (by C-G formula based on SCr of 1.5 mg/dL (H)).   Medical History: Past Medical History:  Diagnosis Date  . Atrial fibrillation ([place])   . Cardiomyopathy ([place])    a. 06/2013 Echo: EF 35-40%, glob HK, mildly dil LA, mild LVH, mild TR, mild to mod MR.  . Carotid arterial disease ([place])    a. 02/2012 s/p L CEA.  . Coronary artery disease    a. Presumed - 02/2012 [place] MV: Anterior, anteroseptal, septal, apical infarct with mild peri-infarct ischemia, EF 41%- medically managed.  . 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 Hyperlipidemia   . Hypertension   . Morbid obesity ([place])   . Stroke [facility]) 2013    Assessment: 66 yo female with A. 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 Pharmacy consulted for warfarin dosing and monitoring.  Patient reports last dose was taken 6/14. Patient is also on Zosyn and Vancomycin.   Home Regimen: Warfarin 5mg    DATE INR DOSE 6/15 2.58 None Given  6/16 3.12 3mg    Goal of Therapy:  INR 2-3 Monitor platelets by anticoagulation protocol: Yes   Plan:  INR slightly supratherapuetic this morning. Dose not given yesterday.  Will order warfarin 3mg  x 1 dose today. INR daily while on ABx per protocol.   Pernell Dupre, PharmD, BCPS Clinical  Pharmacist 09/23/2016 10:44 AM

## 2016-09-23 NOTE — Evaluation (Signed)
Physical Therapy Evaluation Patient Details Name: Meghan [name] MRN: [id number] DOB: [birth year] Today's Date: 09/23/2016   History of Present Illness  65 y/o female admitted on 09/21/16 due to a worsening L great toe ulcer. Pt was diagnosed with osteomyelitis and her L great toe was amputated at the [facility]st [facility] on 09/22/16. PMH includes chronic systolic heart failure, persistent a-fib, CKD stage III, CAD, DM, HTN, obesity, hyperlipidemia, and neuropathy.    Clinical Impression  Pt is a pleasant 65 year old admitted for osteomyelitis of her L great toe with subsequent amputation. Per MD protocol, pt only allowed to ambulate to recliner with orthowedge donned and RW. Pt performs bed mobility with min assist, and transfers/ambulation with +2 mod assist for safety. Pt ambulated from EOB to recliner (62ft), but she was very fearful of standing/weight bearing on her L LE. Pt required heavy verbal cueing for sequencing and encouragement. Pt demonstrates deficits in L LE strength, functional mobility, and pain. Would benefit from skilled PT to address above deficits and promote optimal return to PLOF. Pt is motivated to participate in therapy. PT recommends dc to SNF given pt's current functional status (below her PLOF) and rehab potential.     Follow Up Recommendations SNF    Equipment Recommendations  Rolling walker with 5" wheels    Recommendations for Other Services       Precautions / Restrictions Precautions Precautions: Fall Restrictions Weight Bearing Restrictions: No (orthowedge donned prior to ambulation with RW)      Mobility  Bed Mobility Overal bed mobility: Needs Assistance Bed Mobility: Supine to Sit     Supine to sit: Min assist     General bed mobility comments: Pt had good strategy and sequencing for supine to sit EOB. Mild dizziness once EOB that passed quickly (<50min). Min assist for safety due to dizziness and unsteadiness.  Transfers Overall transfer level: Needs  assistance Equipment used: Rolling walker (2 wheeled) Transfers: Sit to/from Stand Sit to Stand: Mod assist;+2 physical assistance;+2 safety/equipment;From elevated surface         General transfer comment: Pt very fearful to come to standing position to weight bear on her LLE. Pt took 2 attempts to come to upright position. No dizziness or unsteadiness once standing. Verbal cueing for hand placement and encouragement.  Ambulation/Gait Ambulation/Gait assistance: Mod assist;+2 physical assistance;+2 safety/equipment Ambulation Distance (Feet): 2 Feet Assistive device: Rolling walker (2 wheeled) Gait Pattern/deviations: Step-to pattern;Decreased stance time - left;Decreased step length - right;Shuffle;Wide base of support;Trunk flexed     General Gait Details: Pt had increased pain and fear while ambulating secondary to weight bearing through L LE. Verbal cueing for sequencing with RW as pt ambulated from EOB to recliner with mod assist from 2 PT's.  Stairs            Wheelchair Mobility    Modified Rankin (Stroke Patients Only)       Balance Overall balance assessment: No apparent balance deficits (not formally assessed) (Pt slightly dizzy at EOB, but this passed quickly.)                                           Pertinent Vitals/Pain Pain Assessment: Faces Faces Pain Scale: Hurts a little bit Pain Location: L foot at amputation site Pain Descriptors / Indicators: Operative site guarding;Discomfort;Grimacing Pain Intervention(s): Limited activity within patient's tolerance;Monitored during session;Repositioned    Home Living  Family/patient expects to be discharged to:: Private residence Living Arrangements: Children (grown children and grandson who can help) Available Help at Discharge: Family (Pt states children/grandchildren available 24hrs/day) Type of Home: House Home Access: Ramped entrance (ramp being built currently, 3 stairs to enter with B  rails)     Home Layout: One level Home Equipment: [place] - single point;Wheelchair - manual (no RW at home)      Prior Function Level of Independence: Independent with assistive device(s)         Comments: Pt states she has been using her manual w/c for mobility the last couple weeks since her toe ulcer was getting worse. Prior to this, pt ambulated with a cane independently. Pt states she didn't move around much, but moved well within her home.     Hand Dominance        Extremity/Trunk Assessment   Upper Extremity Assessment Upper Extremity Assessment: Overall WFL for tasks assessed (5/5 for B elbow flex/ext and grip)    Lower Extremity Assessment Lower Extremity Assessment: Overall WFL for tasks assessed;LLE deficits/detail (RLE 5/5 for DF/PF, knee flex/ext) LLE Deficits / Details: Did not assess DF/PF. Knee flex/ext 4-/5 secondary to pain.       Communication   Communication: No difficulties  Cognition Arousal/Alertness: Awake/alert Behavior During Therapy: Anxious (Pt very fearful to come to standing position and WB on LLE.) Overall Cognitive Status: Within Functional Limits for tasks assessed                                        General Comments      Exercises Other Exercises Other Exercises: Supine ther-ex x10 B included: ankle pumps (RLE only), SLR's, hip abd, and heel slides. Performed with verbal cueing for sequencing and supervision.   Assessment/Plan    PT Assessment Patient needs continued PT services  PT Problem List Decreased strength;Decreased activity tolerance;Obesity;Pain       PT Treatment Interventions Gait training;Stair training;Therapeutic activities;Therapeutic exercise;Patient/family education    PT Goals (Current goals can be found in the Care Plan section)  Acute Rehab PT Goals Patient Stated Goal: to go home PT Goal Formulation: With patient Time For Goal Achievement: 10/07/16 Potential to Achieve Goals: Good     Frequency (P) 7X/week   Barriers to discharge        Co-evaluation               AM-PAC PT "6 Clicks" Daily Activity  Outcome Measure Difficulty turning over in bed (including adjusting bedclothes, sheets and blankets)?: Total Difficulty moving from lying on back to sitting on the side of the bed? : Total Difficulty sitting down on and standing up from a chair with arms (e.g., wheelchair, bedside commode, etc,.)?: Total Help needed moving to and from a bed to chair (including a wheelchair)?: A Lot Help needed walking in hospital room?: Total Help needed climbing 3-5 steps with a railing? : Total 6 Click Score: 7    End of Session Equipment Utilized During Treatment: Gait belt Activity Tolerance: Patient limited by pain (Limited due to fear of weight bearing/standing.) Patient left: in chair;with call bell/phone within reach;with chair alarm set Nurse Communication: Mobility status (Fear of movement, +2 assist) PT Visit Diagnosis: Other abnormalities of gait and mobility (R26.89);Muscle weakness (generalized) (M62.81);Pain Pain - Right/Left: Left Pain - part of body:  (L foot at amputation site)    Time: 4765-4650  PT Time Calculation (min) (ACUTE ONLY): 24 min   Charges:   PT Evaluation $PT Eval Moderate Complexity: 1 Procedure PT Treatments $Therapeutic Exercise: 8-22 mins   PT G Codes:        Donaciano Eva, PT, SPT  Marni Griffon 09/23/2016, 4:24 PM

## 2016-09-23 NOTE — Progress Notes (Signed)
Patient Demographics  Meghan [name], is a 66 y.o. female   MRN: [id number]   DOB - Jul 13, 1950  Admit Date - 09/21/2016    Outpatient Primary MD for the patient is Abisogun, Domenica Reamer, MD  Consult requested in the Hospital by Bettey Costa, MD, On 09/23/2016  With History of -  Past Medical History:  Diagnosis Date  . Atrial fibrillation ([place])   . Cardiomyopathy ([place])    a. 06/2013 Echo: EF 35-40%, glob HK, mildly dil LA, mild LVH, mild TR, mild to mod MR.  . Carotid arterial disease ([place])    a. 02/2012 s/p L CEA.  . Coronary artery disease    a. Presumed - 02/2012 Lexi MV: Anterior, anteroseptal, septal, apical infarct with mild peri-infarct ischemia, EF 41%- medically managed.  . 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 Hyperlipidemia   . Hypertension   . Morbid obesity ([place])   . Stroke [facility]) 2013      Past Surgical History:  Procedure Laterality Date  . AMPUTATION TOE Left 09/22/2016   Procedure: AMPUTATION TOE;  Surgeon: Albertine Patricia, DPM;  Location: [facility] ORS;  Service: Podiatry;  Laterality: Left;  . CARDIOVERSION N/A 07/28/2016   Procedure: CARDIOVERSION;  Surgeon: Wellington Hampshire, MD;  Location: [facility] ORS;  Service: Cardiovascular;  Laterality: N/A;  . CARDIOVERSION N/A 08/07/2016   Procedure: CARDIOVERSION;  Surgeon: Wellington Hampshire, MD;  Location: [facility] ORS;  Service: Cardiovascular;  Laterality: N/A;  . CAROTID ENDARTERECTOMY     [facility]; Dr. Lucky Cowboy  . CHOLECYSTECTOMY    . LEG SURGERY     right;infection  . TONSILLECTOMY      in for   Chief Complaint  Patient presents with  . Toe Pain     HPI  Meghan [name]  is a 66 y.o. female, Patient is status post amputation to the left hallux yesterday. Had significant osteomyelitis infection left great toe  Social History Social  History  Substance Use Topics  . Smoking status: Former Smoker    Packs/day: 0.50    Years: 5.00    Types: Cigarettes  . Smokeless tobacco: Never Used  . Alcohol use No     Family History Family History  Problem Relation Age of Onset  . Heart disease Mother      Prior to Admission medications   Medication Sig Start Date End Date Taking? Authorizing Provider  amiodarone (PACERONE) 200 MG tablet Take 1 tablet (200 mg total) by mouth daily. 09/18/16  Yes Wellington Hampshire, MD  atorvastatin (LIPITOR) 10 MG tablet TAKE ONE TABLET BY MOUTH ONCE DAILY 08/03/15  Yes Wellington Hampshire, MD  furosemide (LASIX) 40 MG tablet Take 1 tablet (40 mg total) by mouth daily. 08/25/16 11/23/16 Yes Wellington Hampshire, MD  glipiZIDE (GLUCOTROL) 10 MG tablet Take 10 mg by mouth 2 (two) times daily. 08/13/15  Yes [provider]  lisinopril (PRINIVIL,ZESTRIL) 10 MG tablet Take 1 tablet (10 mg total) by mouth daily. 08/17/16 11/15/16 Yes  Wellington Hampshire, MD  metFORMIN (GLUCOPHAGE) 500 MG tablet Take 500 mg by mouth 2 (two) times daily.  06/18/16  Yes [provider]  metoprolol succinate (TOPROL-XL) 100 MG 24 hr tablet Take 1 tablet (100 mg total) by mouth daily. Take with or immediately following a meal. 08/17/16  Yes Wellington Hampshire, MD  spironolactone (ALDACTONE) 25 MG tablet Take 1 tablet (25 mg total) by mouth daily. 09/18/16 12/17/16 Yes Wellington Hampshire, MD  warfarin (COUMADIN) 5 MG tablet Take 1 tablet (5 mg total) by mouth one time only at 6 PM. Start from Sunday April 22nd 08/25/16  Yes Arida, Muhammad A, MD    Anti-infectives    Start     Dose/Rate Route Frequency Ordered Stop   09/22/16 2359  vancomycin (VANCOCIN) 1,250 mg in sodium chloride 0.9 % 250 mL IVPB     1,250 mg 166.7 mL/hr over 90 Minutes Intravenous Every 24 hours 09/22/16 0933     09/22/16 0200  piperacillin-tazobactam (ZOSYN) IVPB 3.375 g     3.375 g 12.5 mL/hr over 240 Minutes Intravenous Every 8 hours 09/21/16 1955      06 /15/18 0100  vancomycin (VANCOCIN) IVPB 1000 mg/200 mL premix  Status:  Discontinued     1,000 mg 200 mL/hr over 60 Minutes Intravenous Every 12 hours 09/21/16 2019 09/22/16 0933   09/21/16 2000  piperacillin-tazobactam (ZOSYN) IVPB 3.375 g     3.375 g 100 mL/hr over 30 Minutes Intravenous  Once 09/21/16 1955 09/21/16 2044   09/21/16 1830  vancomycin (VANCOCIN) IVPB 1000 mg/200 mL premix     1,000 mg 200 mL/hr over 60 Minutes Intravenous  Once 09/21/16 1821 09/21/16 1947      Scheduled Meds: . amiodarone  200 mg Oral Daily  . atorvastatin  10 mg Oral Daily  . docusate sodium  100 mg Oral BID  . insulin aspart  0-15 Units Subcutaneous TID WC  . insulin aspart  0-5 Units Subcutaneous QHS  . lisinopril  10 mg Oral Daily  . metoprolol succinate  100 mg Oral Daily  . spironolactone  25 mg Oral Daily   Continuous Infusions: . piperacillin-tazobactam (ZOSYN)  IV Stopped (09/23/16 9179)  . vancomycin Stopped (09/23/16 0031)   PRN Meds:.acetaminophen **OR** acetaminophen, HYDROcodone-acetaminophen, ondansetron **OR** ondansetron (ZOFRAN) IV  No Known Allergies  Physical Exam  Vitals  Blood pressure (!) 111/58, pulse 62, temperature 97.8 F (36.6 C), temperature source Oral, resp. rate 18, height 5\' 7"  (1.702 m), weight 110.7 kg (244 lb), SpO2 92 %.  Lower Extremity exam:Exam of the surgical site today shows incision margins intact and stable. Redness is diminished some from yesterday. Appears to be progressing. Still waiting for culture reports come back so we can put on appropriate antibiotic. Overall seems stable with the foot.  Data Review  CBC  Recent Labs Lab 09/21/16 1452 09/22/16 0402 09/23/16 0519  WBC 7.9 9.3 9.1  HGB 8.9* 8.1* 8.9*  HCT 28.5* 25.9* 28.6*  PLT 390 365 463*  MCV 72.1* 70.4* 71.2*  MCH 22.5* 22.0* 22.2*  MCHC 31.2* 31.3* 31.2*  RDW 21.8* 21.5* 21.7*    ------------------------------------------------------------------------------------------------------------------  Chemistries   Recent Labs Lab 09/18/16 1448 09/21/16 1452 09/22/16 0402 09/23/16 0519  NA 137 132* 135 136  K 3.7 2.8* 3.2* 4.3  CL 97 96* 99* 100*  CO2 22 27 28 28   GLUCOSE 123* 142* 151* 153*  BUN 18 15 17 18   CREATININE 1.16* 1.16* 1.29* 1.50*  CALCIUM 8.4* 8.2* 7.9* 8.4*  MG  --  1.3*  --   --    ------------------------------------------------------------------------------------  ---------------------------------------------------------------------------------------------------------------   Assessment & Plan: Bandage change in foot assess today. Show signs of significant improvement evening appears to be stable at this juncture. Plan: Redressed the foot today we will order physical therapy to M bathroom privileges and transfer with assistance and a walker and also using the OrthoWedge postop shoe. Await the culture reports for oral antibiotic discharge. Appreciate Dr. Blane Ohara input.  Principal Problem:   Toe osteomyelitis, left (HCC) Active Problems:   Preop cardiovascular exam   Chronic systolic heart failure (HCC)   Persistent atrial fibrillation (HCC)   CKD (chronic kidney disease), stage III   Anemia   Hypokalemia   Family Communication: Plan discussed with patient    Perry Mount M.D on 09/23/2016 at 7:51 AM  Thank you for the consult, we will follow the patient with you in the Hospital.

## 2016-09-23 NOTE — Progress Notes (Signed)
Pt is alert and oriented. No complaints of pain during the night. Dressing remaining dry and intact. Iv infusing without difficulty.

## 2016-09-23 NOTE — Progress Notes (Signed)
[place] [facility] [place] NAME: Meghan [name]    MR#:  [id number]  DATE OF BIRTH:  1950-10-10  SUBJECTIVE:  Patient Underwent amputation yesterday. No acute events overnight.  REVIEW OF SYSTEMS:    Review of Systems  Constitutional: Negative for fever, chills weight loss HENT: Negative for ear pain, nosebleeds, congestion, facial swelling, rhinorrhea, neck pain, neck stiffness and ear discharge.   Respiratory: Negative for cough, shortness of breath, wheezing  Cardiovascular: Negative for chest pain, palpitations and leg swelling.  Gastrointestinal: Negative for heartburn, abdominal pain, vomiting, diarrhea or consitpation Genitourinary: Negative for dysuria, urgency, frequency, hematuria Musculoskeletal: Negative for back pain or joint pain Neurological: Negative for dizziness, seizures, syncope, focal weakness,  numbness and headaches.  Hematological: Does not bruise/bleed easily.  Psychiatric/Behavioral: Negative for hallucinations, confusion, dysphoric mood Skin: Status post amputation  Tolerating Diet:yes     DRUG ALLERGIES:  No Known Allergies  VITALS:  Blood pressure (!) 143/70, pulse 61, temperature 97.6 F (36.4 C), temperature source Oral, resp. rate 18, height 5\' 7"  (1.702 m), weight 110.7 kg (244 lb), SpO2 91 %.  PHYSICAL EXAMINATION:  Constitutional: Appears well-developed and well-nourished. No distress. HENT: Normocephalic. Marland Kitchen Oropharynx is clear and moist.  Eyes: Conjunctivae and EOM are normal. PERRLA, no scleral icterus.  Neck: Normal ROM. Neck supple. No JVD. No tracheal deviation. CVS: RRR, S1/S2 +, no murmurs, no gallops, no carotid bruit.  Pulmonary: Effort and breath sounds normal, no stridor, rhonchi, wheezes, rales.  Abdominal: Soft. BS +,  no distension, tenderness, rebound or guarding.  Musculoskeletal: Normal range of motion. No edema and no tenderness.  Neuro: Alert. CN 2-12 grossly intact. No focal  deficits. Skin: Erythema has decreased. She had an amputation of the left hallux  Psychiatric: Normal mood and affect.      LABORATORY PANEL:   CBC  Recent Labs Lab 09/23/16 0519  WBC 9.1  HGB 8.9*  HCT 28.6*  PLT 463*   ------------------------------------------------------------------------------------------------------------------  Chemistries   Recent Labs Lab 09/21/16 1452  09/23/16 0519  NA 132*  < > 136  K 2.8*  < > 4.3  CL 96*  < > 100*  CO2 27  < > 28  GLUCOSE 142*  < > 153*  BUN 15  < > 18  CREATININE 1.16*  < > 1.50*  CALCIUM 8.2*  < > 8.4*  MG 1.3*  --   --   < > = values in this interval not displayed. ------------------------------------------------------------------------------------------------------------------  Cardiac Enzymes No results for input(s): TROPONINI in the last 168 hours. ------------------------------------------------------------------------------------------------------------------  RADIOLOGY:  Mr Foot Left Wo Contrast  Result Date: 09/21/2016 CLINICAL DATA:  Osteomyelitis of the left great toe. Diabetes. Ulceration. Swelling and erythema. EXAM: MRI OF THE LEFT FOOT WITHOUT CONTRAST TECHNIQUE: Multiplanar, multisequence MR imaging of the left forefoot was performed. No intravenous contrast was administered. COMPARISON:  None. FINDINGS: Bones/Joint/Cartilage MRI confirms abnormal osseous edema in the proximal and distal phalanges of the great toe compatible with osteomyelitis. Cortical demineralization/ destruction in the distal phalanx compatible with more advanced osteomyelitis. Suspected transverse fracture of the distal phalanx based on the conventional radiographs. No other regions of osteomyelitis are identified in the forefoot. There is considerable degenerative arthropathy at the Lisfranc joint without malalignment. Type 2 accessory navicular, without edema. Degenerative midfoot arthropathy. Ligaments The Lisfranc ligament appears  intact on image 8/7. Muscles and Tendons Low-level edema tracks within the plantar musculature of the foot in a generalized fashion. Atrophic musculature of  the forefoot. Soft tissues Dorsal subcutaneous edema tracking along the forefoot. Plantar ulceration below the head of the first metatarsal on image 23/4. Extensive subcutaneous edema in the great toe with a less degree of edema in the second toe. IMPRESSION: 1. Osteomyelitis of the proximal and distal phalanges of the great toe with cortical demineralization in destruction of parts of the distal phalanx. No drainable abscess. 2. Dorsal subcutaneous edema in the forefoot, cellulitis not excluded. There is low-grade edema tracking along the atrophic musculature of the plantar foot, which could reflect low grade myositis. No drainable abscess observed. 3. Plantar ulceration below the head of the first metatarsal. 4. Degenerative midfoot and Lisfranc joint arthropathy. Lisfranc ligament intact. Electronically Signed   By: Van Clines M.D.   On: 09/21/2016 22:27   Dg Toe Great Left  Result Date: 09/21/2016 CLINICAL DATA:  Swelling and erythema of the left great toe with yellowish drainage and foul odor. EXAM: LEFT GREAT TOE COMPARISON:  None. FINDINGS: Abnormal appearance of the base of the left distal phalanx were there is bone destruction, osteopenia and bony sequestra noted in the adjacent soft tissues. Nondisplaced fracture lucency is also seen along its midportion with an intra-articular fracture along the base of the phalanx medially. The soft tissues are edematous in swelling with soft tissue ulceration noted along the medial aspect of the great toe. Osteoarthritic joint space narrowing is seen of the interphalangeal joint of the great toe. No frank bone destruction is apparent of the adjacent first proximal phalanx though there is suggestion of mild periosteal new bone formation along its medial aspect. IMPRESSION: 1. Findings in keeping with  osteomyelitis of the first distal phalangeal base with bony sequestra in the adjacent medial soft tissues. There is overlying soft tissue swelling with ulceration of the great toe. 2. Nondisplaced fracture involving the midportion of the left distal phalanx. There also appears to be an intra-articular extension of fracture the medial aspect of the distal phalanx at the interphalangeal joint. 3. Subtle periosteal new bone formation is also seen along the medial aspect of the distal first proximal phalanx which may also reflect subtle changes of osteomyelitis. Electronically Signed   By: Ashley Royalty M.D.   On: 09/21/2016 18:02     ASSESSMENT AND PLAN:   66 year old female with diabetes who is found to have osteomyelitis of first big toe left foot  1. Osteomyelitis left great TKW:[id number] day #1 status post imitation of left hallux  Continue current antibiotics with follow-up on wound cultures.  Appreciate ID and podiatry consult    2. Atrial fibrillation: Restart Coumadin  Continue metoprolol for heart rate control   3. Chronic systolic heart failure ejection fraction 25-30%: Continue lisinopril and metoprolol and Aldactone  4. Acute on chronic anemia: Monitor CBC Iron panel consistent with anemia of chronic disease  5. Hyperlipidemia: Continue statin  6. Diabetes: Continue sliding scale insulinAnd order glipizide Follow blood sugars  7. Hypokalemia: Improved   8. Chronic kidney disease stage III: Creatinine stable baseline  Management plans discussed with the patient and she is in agreement.  Physical therapy evaluation for disposition  CODE STATUS: full  TOTAL TIME TAKING CARE OF THIS PATIENT: 22 minutes.     POSSIBLE D/C 1-2 days, DEPENDING ON CLINICAL CONDITION.   Josafat Enrico M.D on 09/23/2016 at 8:59 AM  Between 7am to 6pm - Pager - [phone] After 6pm go to [email or web address] - password [email or web address] [facility]  Office  [phone]  CC: Primary  care  physician; Sheran Fava, MD  Note: This dictation was prepared with Dragon dictation along with smaller phrase technology. Any transcriptional errors that result from this process are unintentional.

## 2016-09-23 NOTE — Progress Notes (Signed)
Pt doing well, Dr. Elvina Mattes in to change dressing, tolerated well, pain med x 1, continues on abt no sign or symptoms of any adverse reaction.

## 2016-09-24 LAB — CBC
HCT: 27.9 % — ABNORMAL LOW (ref 35.0–47.0)
Hemoglobin: 8.6 g/dL — ABNORMAL LOW (ref 12.0–16.0)
MCH: 22 pg — ABNORMAL LOW (ref 26.0–34.0)
MCHC: 30.8 g/dL — ABNORMAL LOW (ref 32.0–36.0)
MCV: 71.6 fL — AB (ref 80.0–100.0)
PLATELETS: 459 10*3/uL — AB (ref 150–440)
RBC: 3.89 MIL/uL (ref 3.80–5.20)
RDW: 21.9 % — ABNORMAL HIGH (ref 11.5–14.5)
WBC: 8.2 10*3/uL (ref 3.6–11.0)

## 2016-09-24 LAB — AEROBIC CULTURE  (SUPERFICIAL SPECIMEN)

## 2016-09-24 LAB — BASIC METABOLIC PANEL
ANION GAP: 6 (ref 5–15)
BUN: 20 mg/dL (ref 6–20)
CO2: 27 mmol/L (ref 22–32)
Calcium: 8.4 mg/dL — ABNORMAL LOW (ref 8.9–10.3)
Chloride: 102 mmol/L (ref 101–111)
Creatinine, Ser: 1.48 mg/dL — ABNORMAL HIGH (ref 0.44–1.00)
GFR calc Af Amer: 41 mL/min — ABNORMAL LOW (ref >60)
GFR, EST NON AFRICAN AMERICAN: 35 mL/min — AB (ref >60)
GLUCOSE: 110 mg/dL — AB (ref 65–99)
POTASSIUM: 4.6 mmol/L (ref 3.5–5.1)
SODIUM: 135 mmol/L (ref 135–145)

## 2016-09-24 LAB — AEROBIC CULTURE W GRAM STAIN (SUPERFICIAL SPECIMEN)

## 2016-09-24 LAB — PROTIME-INR
INR: 3.28
PROTHROMBIN TIME: 34.1 s — AB (ref 11.4–15.2)

## 2016-09-24 LAB — GLUCOSE, CAPILLARY
GLUCOSE-CAPILLARY: 85 mg/dL (ref 65–99)
Glucose-Capillary: 167 mg/dL — ABNORMAL HIGH (ref 65–99)

## 2016-09-24 MED ORDER — CIPROFLOXACIN HCL 500 MG PO TABS: 500.0000 mg | ORAL_TABLET | Freq: Two times a day (BID) | ORAL | 0 refills | Status: AC

## 2016-09-24 MED ORDER — HYDROCODONE-ACETAMINOPHEN 5-325 MG PO TABS: 1.0000 | ORAL_TABLET | Freq: Four times a day (QID) | ORAL | 0 refills | Status: DC | PRN

## 2016-09-24 MED ORDER — AMOXICILLIN-POT CLAVULANATE 875-125 MG PO TABS: 1.0000 | ORAL_TABLET | Freq: Two times a day (BID) | ORAL | 0 refills | Status: AC

## 2016-09-24 NOTE — Progress Notes (Signed)
Pt discharging to home with daughter. Declines SNF. Declined HH. Reviewed meds and D/C instructions. RW delivered to pt's room. Pt transported by car to home.

## 2016-09-24 NOTE — Progress Notes (Signed)
ANTICOAGULATION CONSULT NOTE - Follow Up  Pharmacy Consult for Warfarin  Indication: atrial fibrillation  No Known Allergies  Patient Measurements: Height: 5\' 7"  (170.2 cm) Weight: 244 lb (110.7 kg) IBW/kg (Calculated) : 61.6  Vital Signs: Temp: 97.4 F (36.3 C) (06/16 2254) Temp Source: Axillary (06/16 2254) BP: 123/71 (06/16 2254) Pulse Rate: 55 (06/16 2254)  Recent Labs  09/21/16 2249 09/22/16 0402 09/23/16 0519 09/24/16 0446  HGB  --  8.1* 8.9* 8.6*  HCT  --  25.9* 28.6* 27.9*  PLT  --  365 463* 459*  LABPROT 28.2*  --  32.8* 34.1*  INR 2.58  --  3.12 3.28  CREATININE  --  1.29* 1.50* 1.48*    Estimated Creatinine Clearance: 46.6 mL/min (A) (by C-G formula based on SCr of 1.48 mg/dL (H)).   Medical History: Past Medical History:  Diagnosis Date  . Atrial fibrillation ([place])   . Cardiomyopathy ([place])    a. 06/2013 Echo: EF 35-40%, glob HK, mildly dil LA, mild LVH, mild TR, mild to mod MR.  . Carotid arterial disease ([place])    a. 02/2012 s/p L CEA.  . Coronary artery disease    a. Presumed - 02/2012 Lexi MV: Anterior, anteroseptal, septal, apical infarct with mild peri-infarct ischemia, EF 41%- medically managed.  . 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 Hyperlipidemia   . Hypertension   . Morbid obesity ([place])   . Stroke [facility]) 2013    Assessment: 66 yo female with A. 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 Pharmacy consulted for warfarin dosing and monitoring.   Patient is also on Zosyn and Vancomycin.   Home Regimen: Warfarin 5mg    DATE INR DOSE 6/15 2.58 None Given  6/16 3.12 3mg   6/17     3.28  Goal of Therapy:  INR 2-3 Monitor platelets by anticoagulation protocol: Yes   Plan:  INR steadily increasing. Will not give a dose today.  Will monitor INR daily while on ABx per protocol.   Olivia [facility] Clinical Pharmacist 09/24/2016 7:47 AM

## 2016-09-24 NOTE — Clinical Social Work Note (Signed)
CSW received consult for SNF. CSW will assess when able.  Santiago Bumpers, MSW, Latanya Presser [phone]

## 2016-09-24 NOTE — Progress Notes (Signed)
[place] at [place] NAME: Meghan [name]    MR#:  [id number]  DATE OF BIRTH:  May 03, 1950  SUBJECTIVE:  Patient does not want to go to SNF No events overnight  REVIEW OF SYSTEMS:    Review of Systems  Constitutional: Negative for fever, chills weight loss HENT: Negative for ear pain, nosebleeds, congestion, facial swelling, rhinorrhea, neck pain, neck stiffness and ear discharge.   Respiratory: Negative for cough, shortness of breath, wheezing  Cardiovascular: Negative for chest pain, palpitations and leg swelling.  Gastrointestinal: Negative for heartburn, abdominal pain, vomiting, diarrhea or consitpation Genitourinary: Negative for dysuria, urgency, frequency, hematuria Musculoskeletal: Negative for back pain or joint pain Neurological: Negative for dizziness, seizures, syncope, focal weakness,  numbness and headaches.  Hematological: Does not bruise/bleed easily.  Psychiatric/Behavioral: Negative for hallucinations, confusion, dysphoric mood Skin: Status post amputation wrapped  Tolerating Diet:yes     DRUG ALLERGIES:  No Known Allergies  VITALS:  Blood pressure 123/71, pulse (!) 55, temperature 97.4 F (36.3 C), temperature source Axillary, resp. rate 20, height 5\' 7"  (1.702 m), weight 110.7 kg (244 lb), SpO2 93 %.  PHYSICAL EXAMINATION:  Constitutional: Appears well-developed and well-nourished. No distress. HENT: Normocephalic. Marland Kitchen Oropharynx is clear and moist.  Eyes: Conjunctivae and EOM are normal. PERRLA, no scleral icterus.  Neck: Normal ROM. Neck supple. No JVD. No tracheal deviation. CVS: RRR, S1/S2 +, no murmurs, no gallops, no carotid bruit.  Pulmonary: Effort and breath sounds normal, no stridor, rhonchi, wheezes, rales.  Abdominal: Soft. BS +,  no distension, tenderness, rebound or guarding.  Musculoskeletal: Normal range of motion. No edema and no tenderness.  Neuro: Alert. CN 2-12 grossly intact. No focal  deficits. Skin: She had an amputation of the left hallux foot wrapped Psychiatric: Normal mood and affect.      LABORATORY PANEL:   CBC  Recent Labs Lab 09/24/16 0446  WBC 8.2  HGB 8.6*  HCT 27.9*  PLT 459*   ------------------------------------------------------------------------------------------------------------------  Chemistries   Recent Labs Lab 09/21/16 1452  09/24/16 0446  NA 132*  < > 135  K 2.8*  < > 4.6  CL 96*  < > 102  CO2 27  < > 27  GLUCOSE 142*  < > 110*  BUN 15  < > 20  CREATININE 1.16*  < > 1.48*  CALCIUM 8.2*  < > 8.4*  MG 1.3*  --   --   < > = values in this interval not displayed. ------------------------------------------------------------------------------------------------------------------  Cardiac Enzymes No results for input(s): TROPONINI in the last 168 hours. ------------------------------------------------------------------------------------------------------------------  RADIOLOGY:  No results found.   ASSESSMENT AND PLAN:   66 year old female with diabetes who is found to have osteomyelitis of first big toe left foot  1. Osteomyelitis left great [id number] day #2 status post imitation of left hallux  Continue current antibiotics  Wound culture showing GPC and GNR  Appreciate ID and podiatry consult    2. Atrial fibrillation: Continue Coumadin Continue metoprolol for heart rate control   3. Chronic systolic heart failure ejection fraction 25-30%: Continue lisinopril and metoprolol and Aldactone  4. Acute on chronic anemia: Hemoglobin is stable. Iron panel is consistent with anemia of chronic disease.  5. Hyperlipidemia: Continue statin  6. Diabetes: Continue ADA diet and oral medications.   7. Hypokalemia: Improved   8. Chronic kidney disease stage III: Creatinine stable and is at baseline  Management plans discussed with the patient and she is in agreement.  Physical  therapy evaluation for  disposition  CODE STATUS: full  TOTAL TIME TAKING CARE OF THIS PATIENT: 22 minutes.     POSSIBLE D/C today/tomorrow, DEPENDING ON CLINICAL CONDITION.   Celene Pippins M.D on 09/24/2016 at 9:23 AM  Between 7am to 6pm - Pager - [phone] After 6pm go to [email or web address] - password [email or web address] [place] [facility]  Office  [phone]  CC: Primary care physician; Abisogun, Domenica Reamer, MD  Note: This dictation was prepared with Dragon dictation along with smaller phrase technology. Any transcriptional errors that result from this process are unintentional.

## 2016-09-24 NOTE — Care Management Note (Signed)
Case Management Note  Patient Details  Name: Nabiha Planck MRN: [id number] Date of Birth: 05/22/1950  Subjective/Objective:      Discussed discharge planning with Mrs Foskey. Mrs Marton chose [facility] to be her home health provider. A Referral for [facility]- PT, RN, Aide was called to [place] [facility]. A referral for a RW was called to [place] at [facility].               Action/Plan:   Expected Discharge Date:  09/24/16               Expected Discharge Plan:   09/24/16  In-House Referral:     Discharge planning Services     Post Acute Care Choice:   Yes Choice offered to:   Patient  DME Arranged:   RW DME Agency:   [facility]  HH Arranged:   PT, RN, Aide [place] Agency:   [facility]  Status of Service:   Completed  If discussed at [place] of Stay Meetings, dates discussed:    Additional Comments:  Xhaiden Coombs A, RN 09/24/2016, 11:14 AM

## 2016-09-24 NOTE — Discharge Summary (Addendum)
[place] at [place] NAME: Meghan [name]    MR#:  [id number]  DATE OF BIRTH:  12/16/1950  DATE OF ADMISSION:  09/21/2016 ADMITTING PHYSICIAN: Theodoro Grist, MD  DATE OF DISCHARGE: 09/24/2016  PRIMARY CARE PHYSICIAN: Abisogun, Domenica Reamer, MD    ADMISSION DIAGNOSIS:  Toe osteomyelitis, left (HCC) [M86.9] Osteomyelitis, unspecified site, unspecified type ([place]) [M86.9]  DISCHARGE DIAGNOSIS:  Principal Problem:   Toe osteomyelitis, left (HCC) Active Problems:   Preop cardiovascular exam   Chronic systolic heart failure (HCC)   Persistent atrial fibrillation (HCC)   CKD (chronic kidney disease), stage III   Anemia   Hypokalemia   SECONDARY DIAGNOSIS:   Past Medical History:  Diagnosis Date  . Atrial fibrillation ([place])   . Cardiomyopathy ([place])    a. 06/2013 Echo: EF 35-40%, glob HK, mildly dil LA, mild LVH, mild TR, mild to mod MR.  . Carotid arterial disease ([place])    a. 02/2012 s/p L CEA.  . Coronary artery disease    a. Presumed - 02/2012 Lexi MV: Anterior, anteroseptal, septal, apical infarct with mild peri-infarct ischemia, EF 41%- medically managed.  . 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 Hyperlipidemia   . Hypertension   . Morbid obesity ([place])   . Stroke [facility]) 2013    HOSPITAL COURSE:  66 year old female with diabetes who is found to have osteomyelitis of first big toe left foot  1. Osteomyelitis left great [id number] day #2 status post imitation of left hallux  She was on broad-spectrum and buttocks. She will be discharged on Augmentin and ciprofloxacin. She will follow-up with podiatry on Wednesday for final culture results. She should keep her dressing dry.  2. Atrial fibrillation: Continue Coumadin Continue metoprolol for heart rate control  EKG will need to be check since she on on Cipro and AMIO at next PCP/podiatry follow up for QTc.   3. Chronic systolic heart failure ejection  fraction 25-30%: Continue lisinopril and metoprolol and Aldactone  4. Acute on chronic anemia: Hemoglobin is stable. Iron panel is consistent with anemia of chronic disease.  5. Hyperlipidemia: Continue statin  6. Diabetes: Continue ADA diet and oral medications.   7. Hypokalemia: Improved   8. Chronic kidney disease stage III: Creatinine stable and is at baseline   DISCHARGE CONDITIONS AND DIET:   Stable for discharge and diabetic diet  CONSULTS OBTAINED:  Treatment Team:  Albertine Patricia, DPM Leonel Ramsay, MD  DRUG ALLERGIES:  No Known Allergies  DISCHARGE MEDICATIONS:   Current Discharge Medication List    START taking these medications   Details  amoxicillin-clavulanate (AUGMENTIN) 875-125 MG tablet Take 1 tablet by mouth 2 (two) times daily. Qty: 26 tablet, Refills: 0    ciprofloxacin (CIPRO) 500 MG tablet Take 1 tablet (500 mg total) by mouth 2 (two) times daily. Qty: 26 tablet, Refills: 0    HYDROcodone-acetaminophen (NORCO) 5-325 MG tablet Take 1 tablet by mouth every 6 (six) hours as needed for moderate pain. Qty: 15 tablet, Refills: 0      CONTINUE these medications which have NOT CHANGED   Details  amiodarone (PACERONE) 200 MG tablet Take 1 tablet (200 mg total) by mouth daily. Qty: 90 tablet, Refills: 3    atorvastatin (LIPITOR) 10 MG tablet TAKE ONE TABLET BY MOUTH ONCE DAILY Qty: 90 tablet, Refills: 3    furosemide (LASIX) 40 MG tablet Take 1 tablet (40 mg total) by mouth daily. Qty: 30 tablet, Refills:  5    glipiZIDE (GLUCOTROL) 10 MG tablet Take 10 mg by mouth 2 (two) times daily.    lisinopril (PRINIVIL,ZESTRIL) 10 MG tablet Take 1 tablet (10 mg total) by mouth daily. Qty: 90 tablet, Refills: 3    metFORMIN (GLUCOPHAGE) 500 MG tablet Take 500 mg by mouth 2 (two) times daily.     metoprolol succinate (TOPROL-XL) 100 MG 24 hr tablet Take 1 tablet (100 mg total) by mouth daily. Take with or immediately following a meal. Qty: 90  tablet, Refills: 2    spironolactone (ALDACTONE) 25 MG tablet Take 1 tablet (25 mg total) by mouth daily. Qty: 30 tablet, Refills: 3    warfarin (COUMADIN) 5 MG tablet Take 1 tablet (5 mg total) by mouth one time only at 6 PM. Start from Sunday April 22nd Qty: 30 tablet, Refills: 0          Today   CHIEF COMPLAINT:  Patient doing well this morning. No acute events   VITAL SIGNS:  Blood pressure 123/71, pulse (!) 55, temperature 97.4 F (36.3 C), temperature source Axillary, resp. rate 20, height 5\' 7"  (1.702 m), weight 110.7 kg (244 lb), SpO2 93 %.   REVIEW OF SYSTEMS:  Review of Systems  Constitutional: Negative.  Negative for chills, fever and malaise/fatigue.  HENT: Negative.  Negative for ear discharge, ear pain, hearing loss, nosebleeds and sore throat.   Eyes: Negative.  Negative for blurred vision and pain.  Respiratory: Negative.  Negative for cough, hemoptysis, shortness of breath and wheezing.   Cardiovascular: Negative.  Negative for chest pain, palpitations and leg swelling.  Gastrointestinal: Negative.  Negative for abdominal pain, blood in stool, diarrhea, nausea and vomiting.  Genitourinary: Negative.  Negative for dysuria.  Musculoskeletal: Negative.  Negative for back pain.  Skin:       Foot is covered  Neurological: Negative for dizziness, tremors, speech change, focal weakness, seizures and headaches.  Endo/Heme/Allergies: Negative.  Does not bruise/bleed easily.  Psychiatric/Behavioral: Negative.  Negative for depression, hallucinations and suicidal ideas.     PHYSICAL EXAMINATION:  GENERAL:  66 y.o.-year-old patient lying in the bed with no acute distress.  NECK:  Supple, no jugular venous distention. No thyroid enlargement, no tenderness.  LUNGS: Normal breath sounds bilaterally, no wheezing, rales,rhonchi  No use of accessory muscles of respiration.  CARDIOVASCULAR: S1, S2 normal. No murmurs, rubs, or gallops.  ABDOMEN: Soft, non-tender,  non-distended. Bowel sounds present. No organomegaly or mass.  EXTREMITIES: No pedal edema, cyanosis, or clubbing.  PSYCHIATRIC: The patient is alert and oriented x 3.  SKIN: Foot is wrapped  DATA REVIEW:   CBC  Recent Labs Lab 09/24/16 0446  WBC 8.2  HGB 8.6*  HCT 27.9*  PLT 459*    Chemistries   Recent Labs Lab 09/21/16 1452  09/24/16 0446  NA 132*  < > 135  K 2.8*  < > 4.6  CL 96*  < > 102  CO2 27  < > 27  GLUCOSE 142*  < > 110*  BUN 15  < > 20  CREATININE 1.16*  < > 1.48*  CALCIUM 8.2*  < > 8.4*  MG 1.3*  --   --   < > = values in this interval not displayed.  Cardiac Enzymes No results for input(s): TROPONINI in the last 168 hours.  Microbiology Results  @MICRORSLT48 @  RADIOLOGY:  No results found.    Current Discharge Medication List    START taking these medications   Details  amoxicillin-clavulanate (  AUGMENTIN) 875-125 MG tablet Take 1 tablet by mouth 2 (two) times daily. Qty: 26 tablet, Refills: 0    ciprofloxacin (CIPRO) 500 MG tablet Take 1 tablet (500 mg total) by mouth 2 (two) times daily. Qty: 26 tablet, Refills: 0    HYDROcodone-acetaminophen (NORCO) 5-325 MG tablet Take 1 tablet by mouth every 6 (six) hours as needed for moderate pain. Qty: 15 tablet, Refills: 0      CONTINUE these medications which have NOT CHANGED   Details  amiodarone (PACERONE) 200 MG tablet Take 1 tablet (200 mg total) by mouth daily. Qty: 90 tablet, Refills: 3    atorvastatin (LIPITOR) 10 MG tablet TAKE ONE TABLET BY MOUTH ONCE DAILY Qty: 90 tablet, Refills: 3    furosemide (LASIX) 40 MG tablet Take 1 tablet (40 mg total) by mouth daily. Qty: 30 tablet, Refills: 5    glipiZIDE (GLUCOTROL) 10 MG tablet Take 10 mg by mouth 2 (two) times daily.    lisinopril (PRINIVIL,ZESTRIL) 10 MG tablet Take 1 tablet (10 mg total) by mouth daily. Qty: 90 tablet, Refills: 3    metFORMIN (GLUCOPHAGE) 500 MG tablet Take 500 mg by mouth 2 (two) times daily.     metoprolol  succinate (TOPROL-XL) 100 MG 24 hr tablet Take 1 tablet (100 mg total) by mouth daily. Take with or immediately following a meal. Qty: 90 tablet, Refills: 2    spironolactone (ALDACTONE) 25 MG tablet Take 1 tablet (25 mg total) by mouth daily. Qty: 30 tablet, Refills: 3    warfarin (COUMADIN) 5 MG tablet Take 1 tablet (5 mg total) by mouth one time only at 6 PM. Start from Sunday April 22nd Qty: 30 tablet, Refills: 0            Management plans discussed with the patient and she is in agreement. Stable for discharge   Patient should follow up with podiatry  CODE STATUS:     Code Status Orders        Start     Ordered   09/21/16 2057  Full code  Continuous     06 /14/18 2056    Code Status History    Date Active Date Inactive Code Status Order ID Comments User Context   07/26/2016  3:30 PM 07/28/2016  6:12 PM Full Code [id number]  Theodoro Grist, MD Inpatient      TOTAL TIME TAKING CARE OF THIS PATIENT: 37 minutes.    Note: This dictation was prepared with Dragon dictation along with smaller phrase technology. Any transcriptional errors that result from this process are unintentional.  Floree Zuniga M.D on 09/24/2016 at 9:30 AM  Between 7am to 6pm - Pager - [phone] After 6pm go to [email or web address] - password [email or web address] [facility] [facility]  Office  [phone]  CC: Primary care physician; Sheran Fava, MD

## 2016-09-24 NOTE — Progress Notes (Signed)
[place] called with questions about prescribed medication at discharge. MD paged.

## 2016-09-24 NOTE — Progress Notes (Signed)
Patient Demographics  Meghan [name], is a 66 y.o. female   MRN: [id number]   DOB - 1951/01/05  Admit Date - 09/21/2016    Outpatient Primary MD for the patient is Abisogun, Domenica Reamer, MD  Consult requested in the Hospital by Bettey Costa, MD, On 09/24/2016    With History of -  Past Medical History:  Diagnosis Date  . Atrial fibrillation ([place])   . Cardiomyopathy ([place])    a. 06/2013 Echo: EF 35-40%, glob HK, mildly dil LA, mild LVH, mild TR, mild to mod MR.  . Carotid arterial disease ([place])    a. 02/2012 s/p L CEA.  . Coronary artery disease    a. Presumed - 02/2012 Lexi MV: Anterior, anteroseptal, septal, apical infarct with mild peri-infarct ischemia, EF 41%- medically managed.  . 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 Hyperlipidemia   . Hypertension   . Morbid obesity ([place])   . Stroke [facility]) 2013      Past Surgical History:  Procedure Laterality Date  . AMPUTATION TOE Left 09/22/2016   Procedure: AMPUTATION TOE;  Surgeon: Albertine Patricia, DPM;  Location: [facility] ORS;  Service: Podiatry;  Laterality: Left;  . CARDIOVERSION N/A 07/28/2016   Procedure: CARDIOVERSION;  Surgeon: Wellington Hampshire, MD;  Location: [facility] ORS;  Service: Cardiovascular;  Laterality: N/A;  . CARDIOVERSION N/A 08/07/2016   Procedure: CARDIOVERSION;  Surgeon: Wellington Hampshire, MD;  Location: [facility] ORS;  Service: Cardiovascular;  Laterality: N/A;  . CAROTID ENDARTERECTOMY     [facility]; Dr. Lucky Cowboy  . CHOLECYSTECTOMY    . LEG SURGERY     right;infection  . TONSILLECTOMY      in for   Chief Complaint  Patient presents with  . Toe Pain     HPI  Meghan [name]  is a 66 y.o. female, And hospitalized for osteomyelitis to the left great toe. Had cellulitis to the digit expanding to the dorsum of the foot. Toe was operated on  Friday for amputation to the MTP joint. She is 2 day status post amputation.  Social History Social History  Substance Use Topics  . Smoking status: Former Smoker    Packs/day: 0.50    Years: 5.00    Types: Cigarettes  . Smokeless tobacco: Never Used  . Alcohol use No     Family History Family History  Problem Relation Age of Onset  . Heart disease Mother      Prior to Admission medications   Medication Sig Start Date End Date Taking? Authorizing Provider  amiodarone (PACERONE) 200 MG tablet Take 1 tablet (200 mg total) by mouth daily. 09/18/16  Yes Wellington Hampshire, MD  atorvastatin (LIPITOR) 10 MG tablet TAKE ONE TABLET BY MOUTH ONCE DAILY 08/03/15  Yes Wellington Hampshire, MD  furosemide (LASIX) 40 MG tablet Take 1 tablet (40 mg total) by mouth daily. 08/25/16 11/23/16 Yes Wellington Hampshire, MD  glipiZIDE (GLUCOTROL) 10 MG tablet Take 10 mg by mouth 2 (two) times daily. 08/13/15  Yes [provider]  lisinopril (PRINIVIL,ZESTRIL) 10 MG tablet Take 1 tablet (10 mg total) by mouth daily. 08/17/16 11/15/16 Yes Wellington Hampshire, MD  metFORMIN (GLUCOPHAGE)  500 MG tablet Take 500 mg by mouth 2 (two) times daily.  06/18/16  Yes [provider]  metoprolol succinate (TOPROL-XL) 100 MG 24 hr tablet Take 1 tablet (100 mg total) by mouth daily. Take with or immediately following a meal. 08/17/16  Yes Wellington Hampshire, MD  spironolactone (ALDACTONE) 25 MG tablet Take 1 tablet (25 mg total) by mouth daily. 09/18/16 12/17/16 Yes Wellington Hampshire, MD  warfarin (COUMADIN) 5 MG tablet Take 1 tablet (5 mg total) by mouth one time only at 6 PM. Start from Sunday April 22nd 08/25/16  Yes Arida, Muhammad A, MD  amoxicillin-clavulanate (AUGMENTIN) 875-125 MG tablet Take 1 tablet by mouth 2 (two) times daily. 09/24/16 10/07/16  Mody, Sital, MD  ciprofloxacin (CIPRO) 500 MG tablet Take 1 tablet (500 mg total) by mouth 2 (two) times daily. 09/24/16 10/07/16  Mody, Sital, MD  HYDROcodone-acetaminophen  (NORCO) 5-325 MG tablet Take 1 tablet by mouth every 6 (six) hours as needed for moderate pain. 09/24/16   Mody, Sital, MD    Anti-infectives    Start     Dose/Rate Route Frequency Ordered Stop   09/24/16 0000  ciprofloxacin (CIPRO) 500 MG tablet     500 mg Oral 2 times daily 09/24/16 0930 10/07/16 2359   09/24/16 0000  amoxicillin-clavulanate (AUGMENTIN) 875-125 MG tablet     1 tablet Oral 2 times daily 09/24/16 0930 10/07/16 2359   09/22/16 2359  vancomycin (VANCOCIN) 1,250 mg in sodium chloride 0.9 % 250 mL IVPB     1,250 mg 166.7 mL/hr over 90 Minutes Intravenous Every 24 hours 09/22/16 0933     09/22/16 0200  piperacillin-tazobactam (ZOSYN) IVPB 3.375 g     3.375 g 12.5 mL/hr over 240 Minutes Intravenous Every 8 hours 09/21/16 1955     06 /15/18 0100  vancomycin (VANCOCIN) IVPB 1000 mg/200 mL premix  Status:  Discontinued     1,000 mg 200 mL/hr over 60 Minutes Intravenous Every 12 hours 09/21/16 2019 09/22/16 0933   09/21/16 2000  piperacillin-tazobactam (ZOSYN) IVPB 3.375 g     3.375 g 100 mL/hr over 30 Minutes Intravenous  Once 09/21/16 1955 09/21/16 2044   09/21/16 1830  vancomycin (VANCOCIN) IVPB 1000 mg/200 mL premix     1,000 mg 200 mL/hr over 60 Minutes Intravenous  Once 09/21/16 1821 09/21/16 1947      Scheduled Meds: . amiodarone  200 mg Oral Daily  . atorvastatin  10 mg Oral Daily  . docusate sodium  100 mg Oral BID  . glipiZIDE  10 mg Oral BID  . insulin aspart  0-15 Units Subcutaneous TID WC  . insulin aspart  0-5 Units Subcutaneous QHS  . lisinopril  10 mg Oral Daily  . metoprolol succinate  100 mg Oral Daily  . spironolactone  25 mg Oral Daily   Continuous Infusions: . piperacillin-tazobactam (ZOSYN)  IV Stopped (09/24/16 0531)  . vancomycin Stopped (09/24/16 0030)   PRN Meds:.acetaminophen **OR** acetaminophen, HYDROcodone-acetaminophen, ondansetron **OR** ondansetron (ZOFRAN) IV  No Known Allergies  Physical Exam:Patient is alert and well-oriented this  morning she has no complaints. States she feels fine no pain to the foot.  Vitals  Blood pressure 123/71, pulse (!) 55, temperature 97.4 F (36.3 C), temperature source Axillary, resp. rate 20, height 5\' 7"  (1.702 m), weight 110.7 kg (244 lb), SpO2 93 %.  Lower Extremity exam: Dressing change and that a the incision margin appears to be intact there is little maceration on area but overall appears to be  stable. The incision margin is little erythema around the base of it but it is minimal and mostly inflammation and cellulitis is improved considerably from Friday. Was able to get PT to work with her as far as taking a few steps to the bathroom and to transfer to the chair with the OrthoWedge shoe and a walker.   Data Review  CBC  Recent Labs Lab 09/21/16 1452 09/22/16 0402 09/23/16 0519 09/24/16 0446  WBC 7.9 9.3 9.1 8.2  HGB 8.9* 8.1* 8.9* 8.6*  HCT 28.5* 25.9* 28.6* 27.9*  PLT 390 365 463* 459*  MCV 72.1* 70.4* 71.2* 71.6*  MCH 22.5* 22.0* 22.2* 22.0*  MCHC 31.2* 31.3* 31.2* 30.8*  RDW 21.8* 21.5* 21.7* 21.9*   ------------------------------------------------------------------------------------------------------------------  Chemistries   Recent Labs Lab 09/18/16 1448 09/21/16 1452 09/22/16 0402 09/23/16 0519 09/24/16 0446  NA 137 132* 135 136 135  K 3.7 2.8* 3.2* 4.3 4.6  CL 97 96* 99* 100* 102  CO2 22 27 28 28 27   GLUCOSE 123* 142* 151* 153* 110*  BUN 18 15 17 18 20   CREATININE 1.16* 1.16* 1.29* 1.50* 1.48*  CALCIUM 8.4* 8.2* 7.9* 8.4* 8.4*  MG  --  1.3*  --   --   --    Assessment & Plan: Will change dressing today. Patient be okay for discharge on Augmentin and Cipro. We'll keep an eye on the culture results once we get some sensitivities. She is to only ambulate a few steps at a time remain with bed rest most the time when she gets home. She states she will be staying with family members. She needs to use the OrthoWedge shoe appropriately whenever walking. I  once again instructed her to use in a fashion that doesn't allow the forefoot hit the ground at all. Leave this dressing intact clean and dry until I see her again on Wednesday of this week.  Principal Problem:   Toe osteomyelitis, left (HCC) Active Problems:   Preop cardiovascular exam   Chronic systolic heart failure (HCC)   Persistent atrial fibrillation (HCC)   CKD (chronic kidney disease), stage III   Anemia   Hypokalemia     Family Communication: Plan discussed with patient and **   Thank you for the consult, we will follow the patient with you in the Hospital.   Perry Mount M.D on 09/24/2016 at 9:57 AM  Thank you for the consult, we will follow the patient with you in the Hospital.

## 2016-09-24 NOTE — Progress Notes (Signed)
Pt is alert and oriented. Medicated for pain with good results. Surgical dressing remaining dry and intact. Able to sleep in between care.

## 2016-09-26 LAB — CULTURE, BLOOD (ROUTINE X 2)
Culture: NO GROWTH
Culture: NO GROWTH
SPECIAL REQUESTS: ADEQUATE
SPECIAL REQUESTS: ADEQUATE

## 2016-09-26 LAB — SURGICAL PATHOLOGY

## 2016-09-27 ENCOUNTER — Other Ambulatory Visit (INDEPENDENT_AMBULATORY_CARE_PROVIDER_SITE_OTHER): Payer: Medicare Other

## 2016-09-27 ENCOUNTER — Ambulatory Visit (INDEPENDENT_AMBULATORY_CARE_PROVIDER_SITE_OTHER): Payer: Medicare Other | Admitting: *Deleted

## 2016-09-27 DIAGNOSIS — I4819 Other persistent atrial fibrillation: Secondary | ICD-10-CM

## 2016-09-27 DIAGNOSIS — I481 Persistent atrial fibrillation: Secondary | ICD-10-CM

## 2016-09-27 DIAGNOSIS — E1122 Type 2 diabetes mellitus with diabetic chronic kidney disease: Secondary | ICD-10-CM | POA: Diagnosis not present

## 2016-09-27 DIAGNOSIS — Z5181 Encounter for therapeutic drug level monitoring: Secondary | ICD-10-CM | POA: Diagnosis not present

## 2016-09-27 DIAGNOSIS — I130 Hypertensive heart and chronic kidney disease with heart failure and stage 1 through stage 4 chronic kidney disease, or unspecified chronic kidney disease: Secondary | ICD-10-CM | POA: Diagnosis not present

## 2016-09-27 DIAGNOSIS — Z89412 Acquired absence of left great toe: Secondary | ICD-10-CM | POA: Diagnosis not present

## 2016-09-27 DIAGNOSIS — I4891 Unspecified atrial fibrillation: Secondary | ICD-10-CM | POA: Diagnosis not present

## 2016-09-27 DIAGNOSIS — E1142 Type 2 diabetes mellitus with diabetic polyneuropathy: Secondary | ICD-10-CM | POA: Diagnosis not present

## 2016-09-27 DIAGNOSIS — M869 Osteomyelitis, unspecified: Secondary | ICD-10-CM | POA: Diagnosis not present

## 2016-09-27 DIAGNOSIS — I5022 Chronic systolic (congestive) heart failure: Secondary | ICD-10-CM | POA: Diagnosis not present

## 2016-09-27 DIAGNOSIS — I2510 Atherosclerotic heart disease of native coronary artery without angina pectoris: Secondary | ICD-10-CM | POA: Diagnosis not present

## 2016-09-27 DIAGNOSIS — Z7901 Long term (current) use of anticoagulants: Secondary | ICD-10-CM | POA: Diagnosis not present

## 2016-09-27 DIAGNOSIS — D631 Anemia in chronic kidney disease: Secondary | ICD-10-CM | POA: Diagnosis not present

## 2016-09-27 DIAGNOSIS — E1169 Type 2 diabetes mellitus with other specified complication: Secondary | ICD-10-CM | POA: Diagnosis not present

## 2016-09-27 DIAGNOSIS — Z7984 Long term (current) use of oral hypoglycemic drugs: Secondary | ICD-10-CM | POA: Diagnosis not present

## 2016-09-27 DIAGNOSIS — N183 Chronic kidney disease, stage 3 (moderate): Secondary | ICD-10-CM | POA: Diagnosis not present

## 2016-09-27 DIAGNOSIS — Z4781 Encounter for orthopedic aftercare following surgical amputation: Secondary | ICD-10-CM | POA: Diagnosis not present

## 2016-09-27 DIAGNOSIS — Z8673 Personal history of transient ischemic attack (TIA), and cerebral infarction without residual deficits: Secondary | ICD-10-CM | POA: Diagnosis not present

## 2016-09-27 DIAGNOSIS — I255 Ischemic cardiomyopathy: Secondary | ICD-10-CM | POA: Diagnosis not present

## 2016-09-27 DIAGNOSIS — Z87891 Personal history of nicotine dependence: Secondary | ICD-10-CM | POA: Diagnosis not present

## 2016-09-27 LAB — POCT INR: INR: 2.6

## 2016-09-28 LAB — ANAEROBIC CULTURE

## 2016-09-28 LAB — BASIC METABOLIC PANEL
BUN/Creatinine Ratio: 11 — ABNORMAL LOW (ref 12–28)
BUN: 15 mg/dL (ref 8–27)
CHLORIDE: 97 mmol/L (ref 96–106)
CO2: 26 mmol/L (ref 20–29)
CREATININE: 1.37 mg/dL — AB (ref 0.57–1.00)
Calcium: 9 mg/dL (ref 8.7–10.3)
GFR calc Af Amer: 46 mL/min/{1.73_m2} — ABNORMAL LOW (ref >59)
GFR calc non Af Amer: 40 mL/min/{1.73_m2} — ABNORMAL LOW (ref >59)
GLUCOSE: 88 mg/dL (ref 65–99)
POTASSIUM: 4.3 mmol/L (ref 3.5–5.2)
SODIUM: 139 mmol/L (ref 134–144)

## 2016-10-02 ENCOUNTER — Ambulatory Visit (INDEPENDENT_AMBULATORY_CARE_PROVIDER_SITE_OTHER): Payer: Medicare Other

## 2016-10-02 DIAGNOSIS — I4891 Unspecified atrial fibrillation: Secondary | ICD-10-CM | POA: Diagnosis not present

## 2016-10-02 DIAGNOSIS — Z5181 Encounter for therapeutic drug level monitoring: Secondary | ICD-10-CM

## 2016-10-02 DIAGNOSIS — I4819 Other persistent atrial fibrillation: Secondary | ICD-10-CM

## 2016-10-02 LAB — POCT INR: INR: 1.5

## 2016-10-02 MED ORDER — WARFARIN SODIUM 5 MG PO TABS: ORAL_TABLET | ORAL | 1 refills | Status: DC

## 2016-10-04 ENCOUNTER — Other Ambulatory Visit: Payer: Self-pay

## 2016-10-04 DIAGNOSIS — I5022 Chronic systolic (congestive) heart failure: Secondary | ICD-10-CM

## 2016-10-06 ENCOUNTER — Ambulatory Visit (INDEPENDENT_AMBULATORY_CARE_PROVIDER_SITE_OTHER): Payer: Medicare Other

## 2016-10-06 ENCOUNTER — Other Ambulatory Visit: Payer: Self-pay

## 2016-10-06 ENCOUNTER — Other Ambulatory Visit (INDEPENDENT_AMBULATORY_CARE_PROVIDER_SITE_OTHER): Payer: Medicare Other

## 2016-10-06 DIAGNOSIS — I5022 Chronic systolic (congestive) heart failure: Secondary | ICD-10-CM | POA: Diagnosis not present

## 2016-10-06 DIAGNOSIS — Z5181 Encounter for therapeutic drug level monitoring: Secondary | ICD-10-CM

## 2016-10-06 DIAGNOSIS — I2510 Atherosclerotic heart disease of native coronary artery without angina pectoris: Secondary | ICD-10-CM

## 2016-10-06 DIAGNOSIS — I481 Persistent atrial fibrillation: Secondary | ICD-10-CM

## 2016-10-06 DIAGNOSIS — Z7901 Long term (current) use of anticoagulants: Secondary | ICD-10-CM

## 2016-10-06 DIAGNOSIS — I4819 Other persistent atrial fibrillation: Secondary | ICD-10-CM

## 2016-10-06 LAB — POCT INR: INR: 1.7

## 2016-10-06 NOTE — Patient Instructions (Signed)
Today's coumadin dosed by Doroteo Bradford in the [facility] coumadin clinic.

## 2016-10-07 LAB — BASIC METABOLIC PANEL
BUN/Creatinine Ratio: 15 (ref 12–28)
BUN: 20 mg/dL (ref 8–27)
CALCIUM: 9 mg/dL (ref 8.7–10.3)
CO2: 24 mmol/L (ref 20–29)
CREATININE: 1.35 mg/dL — AB (ref 0.57–1.00)
Chloride: 97 mmol/L (ref 96–106)
GFR, EST AFRICAN AMERICAN: 47 mL/min/{1.73_m2} — AB (ref >59)
GFR, EST NON AFRICAN AMERICAN: 40 mL/min/{1.73_m2} — AB (ref >59)
Glucose: 78 mg/dL (ref 65–99)
Potassium: 4.3 mmol/L (ref 3.5–5.2)
SODIUM: 138 mmol/L (ref 134–144)

## 2016-10-13 ENCOUNTER — Telehealth: Payer: Self-pay | Admitting: Cardiovascular Disease

## 2016-10-13 ENCOUNTER — Ambulatory Visit (INDEPENDENT_AMBULATORY_CARE_PROVIDER_SITE_OTHER): Payer: Medicare Other | Admitting: *Deleted

## 2016-10-13 DIAGNOSIS — I2510 Atherosclerotic heart disease of native coronary artery without angina pectoris: Secondary | ICD-10-CM | POA: Diagnosis not present

## 2016-10-13 DIAGNOSIS — I4891 Unspecified atrial fibrillation: Secondary | ICD-10-CM

## 2016-10-13 LAB — POCT INR: INR: 2

## 2016-10-13 NOTE — Telephone Encounter (Signed)
Patient here today to have INR checked and she thought she was supposed to get repeat labs as well. Reviewed chart, notes, and results and do not see any mention of repeat labs. Patient is coming back in to see Ignacia Bayley NP on 10/25/16. INR today was 2.0 and she will have it rechecked at upcoming appointment. Daughter present with her today and would like [place] to check with Dr. Fletcher Anon about repeat labs and give her a call back. Let her know that I would check on this and be in touch.

## 2016-10-13 NOTE — Telephone Encounter (Signed)
No need for labs now

## 2016-10-13 NOTE — Telephone Encounter (Signed)
Made in error

## 2016-10-13 NOTE — Telephone Encounter (Signed)
Spoke with patient and let her know that there was no need for labs and to make sure to let her daughter know as well. She verbalized understanding with no further questions at this time.

## 2016-10-20 ENCOUNTER — Other Ambulatory Visit: Payer: Self-pay | Admitting: Cardiovascular Disease

## 2016-10-24 DIAGNOSIS — E11621 Type 2 diabetes mellitus with foot ulcer: Secondary | ICD-10-CM | POA: Diagnosis not present

## 2016-10-24 DIAGNOSIS — L97509 Non-pressure chronic ulcer of other part of unspecified foot with unspecified severity: Secondary | ICD-10-CM | POA: Diagnosis not present

## 2016-10-24 DIAGNOSIS — L309 Dermatitis, unspecified: Secondary | ICD-10-CM | POA: Diagnosis not present

## 2016-10-24 DIAGNOSIS — Z89412 Acquired absence of left great toe: Secondary | ICD-10-CM | POA: Diagnosis not present

## 2016-10-24 DIAGNOSIS — L97511 Non-pressure chronic ulcer of other part of right foot limited to breakdown of skin: Secondary | ICD-10-CM | POA: Diagnosis not present

## 2016-10-25 ENCOUNTER — Ambulatory Visit (INDEPENDENT_AMBULATORY_CARE_PROVIDER_SITE_OTHER): Payer: Medicare Other | Admitting: Nurse Practitioner

## 2016-10-25 ENCOUNTER — Ambulatory Visit (INDEPENDENT_AMBULATORY_CARE_PROVIDER_SITE_OTHER): Payer: Medicare Other | Admitting: *Deleted

## 2016-10-25 ENCOUNTER — Encounter: Payer: Self-pay | Admitting: Nurse Practitioner

## 2016-10-25 VITALS — BP 126/58 | HR 53 | Ht 67.0 in | Wt 231.8 lb

## 2016-10-25 DIAGNOSIS — I420 Dilated cardiomyopathy: Secondary | ICD-10-CM

## 2016-10-25 DIAGNOSIS — I4891 Unspecified atrial fibrillation: Secondary | ICD-10-CM | POA: Diagnosis not present

## 2016-10-25 DIAGNOSIS — I4819 Other persistent atrial fibrillation: Secondary | ICD-10-CM

## 2016-10-25 DIAGNOSIS — I481 Persistent atrial fibrillation: Secondary | ICD-10-CM | POA: Diagnosis not present

## 2016-10-25 DIAGNOSIS — E782 Mixed hyperlipidemia: Secondary | ICD-10-CM | POA: Diagnosis not present

## 2016-10-25 DIAGNOSIS — I2510 Atherosclerotic heart disease of native coronary artery without angina pectoris: Secondary | ICD-10-CM

## 2016-10-25 DIAGNOSIS — Z5181 Encounter for therapeutic drug level monitoring: Secondary | ICD-10-CM | POA: Diagnosis not present

## 2016-10-25 DIAGNOSIS — I10 Essential (primary) hypertension: Secondary | ICD-10-CM

## 2016-10-25 LAB — POCT INR: INR: 2.1

## 2016-10-25 NOTE — Progress Notes (Signed)
Office Visit    Patient Name: Meghan [name] Date of Encounter: 10/25/2016  Primary Care Provider:  Sheran Fava, MD Primary Cardiologist:  Jerilynn Mages. Fletcher Anon, MD   Chief Complaint    66 year old female with a prior history of cardiomyopathy presumed to be ischemic, presumed coronary artery disease, hypertension, hyperlipidemia, diabetes, stroke, carotid arterial disease, and atrial fibrillation, who presents for follow-up.  Past Medical History    Past Medical History:  Diagnosis Date  . Cardiomyopathy ([place])    a. 06/2013 Echo: EF 35-40%, glob HK, mildly dil LA, mild LVH, mild TR, mild to mod MR; b. 07/2016 Echo: EF 25-30%, sev mid-apicalanteroseptal, ant, apical HK, mild MR, sev dil LA, mod dil RA, mod TR, PASP 55mmHg.  . Carotid arterial disease ([place])    a. 02/2012 s/p L CEA.  . Coronary artery disease    a. Presumed - 02/2012 Lexi MV: Anterior, anteroseptal, septal, apical infarct with mild peri-infarct ischemia, EF 41%- medically managed.  . 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 Hyperlipidemia   . Hypertension   . Morbid obesity ([place])   . Persistent atrial fibrillation (HCC)    a. CHA2DS2VASc = 7-->coumadin; maintaining sinus on amio.  . Stroke [facility]) 2013   Past Surgical History:  Procedure Laterality Date  . AMPUTATION TOE Left 09/22/2016   Procedure: AMPUTATION TOE;  Surgeon: Albertine Patricia, DPM;  Location: [facility] ORS;  Service: Podiatry;  Laterality: Left;  . CARDIOVERSION N/A 07/28/2016   Procedure: CARDIOVERSION;  Surgeon: Wellington Hampshire, MD;  Location: [facility] ORS;  Service: Cardiovascular;  Laterality: N/A;  . CARDIOVERSION N/A 08/07/2016   Procedure: CARDIOVERSION;  Surgeon: Wellington Hampshire, MD;  Location: [facility] ORS;  Service: Cardiovascular;  Laterality: N/A;  . CAROTID ENDARTERECTOMY     [facility]; Dr. Lucky Cowboy  . CHOLECYSTECTOMY    . LEG SURGERY     right;infection  . TONSILLECTOMY      Allergies  No Known Allergies  History of Present Illness      66 year old female with the above complex past medical history including presumed coronary artery disease in the setting of an abnormal stress test in November 2013 revealing anterior, anteroseptal, septal, apical infarct with mild peri-infarct ischemia. She has never undergone diagnostic catheterization and has been medically managed. She also has LV dysfunction with an EF of 25-30% by echocardiogram April 2018 in the setting of atrial fibrillation. Other history includes hypertension, hyperlipidemia, diabetes, obesity, and stroke. She had recurrent atrial fibrillation earlier this year in spite of ongoing amiodarone therapy. Metoprolol was increased in May and upon follow-up in June, she was back in sinus rhythm. Plan at that point was to follow-up echocardiogram in 3 months to reevaluate LV function.  Since her last visit, she has done quite well. Her Lasix dose was adjusted at her last visit, and she has had significant improvement in swelling. Her weight is down about 13 pounds. She denies chest pain, palpitations, dyspnea, PND, orthopnea, dizziness, syncope, or early satiety. She does occasionally note left lower extremity swelling.  Home Medications    Prior to Admission medications   Medication Sig Start Date End Date Taking? Authorizing Provider  amiodarone (PACERONE) 200 MG tablet Take 1 tablet (200 mg total) by mouth daily. 09/18/16  Yes Wellington Hampshire, MD  atorvastatin (LIPITOR) 10 MG tablet TAKE ONE TABLET BY MOUTH ONCE DAILY 10/23/16  Yes Wellington Hampshire, MD  furosemide (LASIX) 40 MG tablet Take 1 tablet (40 mg total) by  mouth daily. 08/25/16 11/23/16 Yes Wellington Hampshire, MD  glipiZIDE (GLUCOTROL) 10 MG tablet Take 10 mg by mouth 2 (two) times daily. 08/13/15  Yes [provider]  HYDROcodone-acetaminophen (NORCO) 5-325 MG tablet Take 1 tablet by mouth every 6 (six) hours as needed for moderate pain. 09/24/16  Yes Mody, Ulice Bold, MD  lisinopril (PRINIVIL,ZESTRIL) 10 MG tablet  Take 1 tablet (10 mg total) by mouth daily. 08/17/16 11/15/16 Yes Wellington Hampshire, MD  metFORMIN (GLUCOPHAGE) 500 MG tablet Take 500 mg by mouth 2 (two) times daily.  06/18/16  Yes [provider]  metoprolol succinate (TOPROL-XL) 100 MG 24 hr tablet Take 1 tablet (100 mg total) by mouth daily. Take with or immediately following a meal. 08/17/16  Yes Wellington Hampshire, MD  spironolactone (ALDACTONE) 25 MG tablet Take 1 tablet (25 mg total) by mouth daily. 09/18/16 12/17/16 Yes Wellington Hampshire, MD  warfarin (COUMADIN) 5 MG tablet Take 1 tablet daily or as directed by Coumadin Clinic 10/02/16  Yes Wellington Hampshire, MD    Review of Systems    As above doing well. Occasional left lower extremity swelling. No chest pain, dyspnea, palpitations, PND, orthopnea, dizziness, syncope, or early satiety.  All other systems reviewed and are otherwise negative except as noted above.  Physical Exam    VS:  BP (!) 126/58 (BP Location: Left Arm, Patient Position: Sitting, Cuff Size: Normal)   Pulse (!) 53   Ht 5\' 7"  (1.702 m)   Wt 231 lb 12 oz (105.1 kg)   BMI 36.30 kg/m  , BMI Body mass index is 36.3 kg/m. GEN: Well nourished, well developed, in no acute distress.  HEENT: normal.  Neck: Supple, no JVD, carotid bruits, or masses. Cardiac: RRR, 2/6 systolic murmur heard throughout. No rubs, or gallops. No clubbing, cyanosis, edema.  Radials/DP/PT 2+ and equal bilaterally.  Respiratory:  Respirations regular and unlabored, bibasilar crackles. GI: Soft, nontender, nondistended, BS + x 4. MS: no deformity or atrophy. Skin: warm and dry, no rash. Neuro:  Strength and sensation are intact. Psych: Normal affect.  Accessory Clinical Findings    ECG - Sinus bradycardia, 54, first-degree AV block, left axis deviation, septal infarct, lateral ST depression with biphasic T waves. No acute changes.  Assessment & Plan    1.  Persistent atrial fibrillation:Patient is maintaining sinus rhythm today on  amiodarone, metoprolol. She is anticoagulated with Coumadin and is due for an INR check today. She has not been having any palpitations at home. Her volume status is optimized. Continue current medical regimen.  2. HFrEF/presumed ischemic cardiomyopathy:  Patient's weight is down about 13 pounds since her last visit. She is euvolemic on exam today and has been doing well at home. She is not weighing herself daily. She also regularly eats fast food.  We discussed the importance of daily weights, sodium restriction, medication compliance, and symptom reporting and she verbalizes understanding. I will arrange for an echocardiogram to reevaluate LV function at the end of August. Pending LV function at that time, we may need to reconsider invasive ischemic evaluation and electrophysiology referral. Continue beta blocker, ACE inhibitor, Lasix, and spironolactone therapy. I will follow-up a basic metabolic panel today given significant weight loss since her last visit.   3. Essential hypertension: Blood pressure stable on beta blocker, ACE inhibitor, and diuretic therapy.  4. Presumed coronary artery disease: Patient with prior history of abnormal stress test was never undergone catheterization. Follow-up echo at the end of August. If EF  remains down, she will likely need invasive evaluation prior to EP referral. She does not have chest pain. She remains on beta blocker and ACE inhibitor and statin therapy. No aspirin in the setting of chronic Coumadin.  5. Hyperlipidemia: Follow-up of primary care. She remains on statin therapy.  6. Type 2 diabetes mellitus: Followed by primary care. She remains on Glucophage.  7.  Disposition: Basic metabolic panel and INR today. Follow-up echo at the end of August. Follow-up with Dr. Fletcher Anon thereafter.   Murray Hodgkins, NP 10/25/2016, 9:10 AM

## 2016-10-25 NOTE — Patient Instructions (Addendum)
Medication Instructions:  Your physician recommends that you continue on your current medications as directed. Please refer to the Current Medication list given to you today.   Labwork: BMET today  Testing/Procedures: Your physician has requested that you have an echocardiogram in August. Echocardiography is a painless test that uses sound waves to create images of your heart. It provides your doctor with information about the size and shape of your heart and how well your heart's chambers and valves are working. This procedure takes approximately one hour. There are no restrictions for this procedure.    Follow-Up: Your physician recommends that you schedule a follow-up appointment in: September with Dr. Fletcher Anon.    Any Other Special Instructions Will Be Listed Below (If Applicable).     If you need a refill on your cardiac medications before your next appointment, please call your pharmacy.  Echocardiogram An echocardiogram, or echocardiography, uses sound waves (ultrasound) to produce an image of your heart. The echocardiogram is simple, painless, obtained within a short period of time, and offers valuable information to your health care provider. The images from an echocardiogram can provide information such as:  Evidence of coronary artery disease (CAD).  Heart size.  Heart muscle function.  Heart valve function.  Aneurysm detection.  Evidence of a past heart attack.  Fluid buildup around the heart.  Heart muscle thickening.  Assess heart valve function.  Tell a health care provider about:  Any allergies you have.  All medicines you are taking, including vitamins, herbs, eye drops, creams, and over-the-counter medicines.  Any problems you or family members have had with anesthetic medicines.  Any blood disorders you have.  Any surgeries you have had.  Any medical conditions you have.  Whether you are pregnant or may be pregnant. What happens before the  procedure? No special preparation is needed. Eat and drink normally. What happens during the procedure?  In order to produce an image of your heart, gel will be applied to your chest and a wand-like tool (transducer) will be moved over your chest. The gel will help transmit the sound waves from the transducer. The sound waves will harmlessly bounce off your heart to allow the heart images to be captured in real-time motion. These images will then be recorded.  You may need an IV to receive a medicine that improves the quality of the pictures. What happens after the procedure? You may return to your normal schedule including diet, activities, and medicines, unless your health care provider tells you otherwise. This information is not intended to replace advice given to you by your health care provider. Make sure you discuss any questions you have with your health care provider. Document Released: 03/24/2000 Document Revised: 11/13/2015 Document Reviewed: 12/02/2012 Elsevier Interactive Patient Education  2017 [employer].

## 2016-10-26 LAB — BASIC METABOLIC PANEL
BUN / CREAT RATIO: 19 (ref 12–28)
BUN: 26 mg/dL (ref 8–27)
CHLORIDE: 99 mmol/L (ref 96–106)
CO2: 24 mmol/L (ref 20–29)
Calcium: 9.1 mg/dL (ref 8.7–10.3)
Creatinine, Ser: 1.35 mg/dL — ABNORMAL HIGH (ref 0.57–1.00)
GFR calc non Af Amer: 40 mL/min/{1.73_m2} — ABNORMAL LOW (ref >59)
GFR, EST AFRICAN AMERICAN: 47 mL/min/{1.73_m2} — AB (ref >59)
Glucose: 102 mg/dL — ABNORMAL HIGH (ref 65–99)
Potassium: 4.5 mmol/L (ref 3.5–5.2)
Sodium: 139 mmol/L (ref 134–144)

## 2016-11-20 DIAGNOSIS — E11621 Type 2 diabetes mellitus with foot ulcer: Secondary | ICD-10-CM | POA: Diagnosis not present

## 2016-11-20 DIAGNOSIS — L97511 Non-pressure chronic ulcer of other part of right foot limited to breakdown of skin: Secondary | ICD-10-CM | POA: Diagnosis not present

## 2016-11-20 DIAGNOSIS — Z89412 Acquired absence of left great toe: Secondary | ICD-10-CM | POA: Diagnosis not present

## 2016-11-20 DIAGNOSIS — L97509 Non-pressure chronic ulcer of other part of unspecified foot with unspecified severity: Secondary | ICD-10-CM | POA: Diagnosis not present

## 2016-11-22 ENCOUNTER — Ambulatory Visit (INDEPENDENT_AMBULATORY_CARE_PROVIDER_SITE_OTHER): Payer: Medicare Other

## 2016-11-22 DIAGNOSIS — I4819 Other persistent atrial fibrillation: Secondary | ICD-10-CM

## 2016-11-22 DIAGNOSIS — I4891 Unspecified atrial fibrillation: Secondary | ICD-10-CM

## 2016-11-22 DIAGNOSIS — Z5181 Encounter for therapeutic drug level monitoring: Secondary | ICD-10-CM | POA: Diagnosis not present

## 2016-11-22 DIAGNOSIS — I481 Persistent atrial fibrillation: Secondary | ICD-10-CM

## 2016-11-22 DIAGNOSIS — I2510 Atherosclerotic heart disease of native coronary artery without angina pectoris: Secondary | ICD-10-CM

## 2016-11-22 LAB — POCT INR: INR: 2.8

## 2016-11-23 ENCOUNTER — Other Ambulatory Visit: Payer: Self-pay | Admitting: Cardiovascular Disease

## 2016-11-24 NOTE — Telephone Encounter (Signed)
Refill Request.  

## 2016-12-06 ENCOUNTER — Ambulatory Visit (INDEPENDENT_AMBULATORY_CARE_PROVIDER_SITE_OTHER): Payer: Medicare Other

## 2016-12-06 ENCOUNTER — Other Ambulatory Visit: Payer: Self-pay

## 2016-12-06 DIAGNOSIS — I4819 Other persistent atrial fibrillation: Secondary | ICD-10-CM

## 2016-12-06 DIAGNOSIS — I481 Persistent atrial fibrillation: Secondary | ICD-10-CM

## 2016-12-21 ENCOUNTER — Telehealth: Payer: Self-pay | Admitting: Cardiovascular Disease

## 2016-12-21 ENCOUNTER — Ambulatory Visit (INDEPENDENT_AMBULATORY_CARE_PROVIDER_SITE_OTHER): Payer: Medicare Other | Admitting: Cardiovascular Disease

## 2016-12-21 ENCOUNTER — Encounter: Payer: Self-pay | Admitting: Cardiovascular Disease

## 2016-12-21 ENCOUNTER — Ambulatory Visit (INDEPENDENT_AMBULATORY_CARE_PROVIDER_SITE_OTHER): Payer: Medicare Other

## 2016-12-21 VITALS — BP 122/60 | HR 55 | Ht 67.0 in | Wt 256.8 lb

## 2016-12-21 DIAGNOSIS — I209 Angina pectoris, unspecified: Secondary | ICD-10-CM

## 2016-12-21 DIAGNOSIS — I4819 Other persistent atrial fibrillation: Secondary | ICD-10-CM

## 2016-12-21 DIAGNOSIS — E785 Hyperlipidemia, unspecified: Secondary | ICD-10-CM

## 2016-12-21 DIAGNOSIS — I10 Essential (primary) hypertension: Secondary | ICD-10-CM

## 2016-12-21 DIAGNOSIS — I481 Persistent atrial fibrillation: Secondary | ICD-10-CM

## 2016-12-21 DIAGNOSIS — Z01812 Encounter for preprocedural laboratory examination: Secondary | ICD-10-CM

## 2016-12-21 DIAGNOSIS — R0602 Shortness of breath: Secondary | ICD-10-CM | POA: Diagnosis not present

## 2016-12-21 DIAGNOSIS — I5023 Acute on chronic systolic (congestive) heart failure: Secondary | ICD-10-CM | POA: Diagnosis not present

## 2016-12-21 DIAGNOSIS — Z5181 Encounter for therapeutic drug level monitoring: Secondary | ICD-10-CM | POA: Diagnosis not present

## 2016-12-21 DIAGNOSIS — I25119 Atherosclerotic heart disease of native coronary artery with unspecified angina pectoris: Secondary | ICD-10-CM

## 2016-12-21 DIAGNOSIS — I4891 Unspecified atrial fibrillation: Secondary | ICD-10-CM

## 2016-12-21 DIAGNOSIS — I480 Paroxysmal atrial fibrillation: Secondary | ICD-10-CM | POA: Diagnosis not present

## 2016-12-21 LAB — POCT INR: INR: 2

## 2016-12-21 MED ORDER — FUROSEMIDE 40 MG PO TABS: 40.0000 mg | ORAL_TABLET | Freq: Every day | ORAL | 3 refills | Status: DC

## 2016-12-21 NOTE — Telephone Encounter (Signed)
Patient says she received a call about upcoming cath but was disconnected .  She doesn't know who to call back.  Please call and touch base

## 2016-12-21 NOTE — Patient Instructions (Addendum)
Medication Instructions:  Your physician has recommended you make the following change in your medication:  RESUME lasix 40mg  once daily   Labwork: BMET, CBC today  Testing/Procedures: Your physician has requested that you have a cardiac catheterization. Cardiac catheterization is used to diagnose and/or treat various heart conditions. Doctors may recommend this procedure for a number of different reasons. The most common reason is to evaluate chest pain. Chest pain can be a symptom of coronary artery disease (CAD), and cardiac catheterization can show whether plaque is narrowing or blocking your heart's arteries. This procedure is also used to evaluate the valves, as well as measure the blood flow and oxygen levels in different parts of your heart. For further information please visit [employer]. Please follow instruction sheet, as given.  [facility] Cardiac Cath Instructions   You are scheduled for a Cardiac Cath on: Monday, September 17  Please arrive at 8:30am on the day of your procedure  Please expect a call from our [place] to pre-register you  Do not eat/drink anything after midnight  Someone will need to drive you home  It is recommended someone be with you for the first 24 hours after your procedure  Wear clothes that are easy to get on/off and wear slip on shoes if possible  HOLD COUMADIN AFTER TODAY'S DOSE (Thursday). Do not take coumadin this Friday, Saturday, or Sunday before your cath. HOLD METFORMIN 24 hours before and for 48 hours after your procedure.  HOLD lasix the morning of your procedure.   Medications bring a current list of all medications with you  Day of your procedure: Arrive at the [place] entrance.  Free valet service is available.  After entering the [place] please check-in at the registration desk (1st desk on your right) to receive your armband. After receiving your armband someone will escort you to the cardiac  cath/special procedures waiting area.  The usual length of stay after your procedure is about 2 to 3 hours.  This can vary.  If you have any questions, please call our office at [phone], or you may call the cardiac cath lab at [facility] directly at [phone]  Follow-Up: Your physician recommends that you schedule a follow-up appointment in: 1 month with Dr. Fletcher Anon.    Any Other Special Instructions Will Be Listed Below (If Applicable).     If you need a refill on your cardiac medications before your next appointment, please call your pharmacy.   Angiogram An angiogram, also called angiography, is a procedure used to look at the blood vessels. In this procedure, dye is injected through a long, thin tube (catheter) into an artery. X-rays are then taken. The X-rays will show if there is a blockage or problem in a blood vessel. Tell a health care provider about:  Any allergies you have, including allergies to shellfish or contrast dye.  All medicines you are taking, including vitamins, herbs, eye drops, creams, and over-the-counter medicines.  Any problems you or family members have had with anesthetic medicines.  Any blood disorders you have.  Any surgeries you have had.  Any previous kidney problems or failure you have had.  Any medical conditions you have.  Possibility of pregnancy, if this applies. What are the risks? Generally, an angiogram is a safe procedure. However, as with any procedure, problems can occur. Possible problems include:  Injury to the blood vessels, including rupture or bleeding.  Infection or bruising at the catheter site.  Allergic reaction to the dye  or contrast used.  Kidney damage from the dye or contrast used.  Blood clots that can lead to a stroke or heart attack.  What happens before the procedure?  Do not eat or drink after midnight on the night before the procedure, or as directed by your health care provider.  Ask your health care  provider if you may drink enough water to take any needed medicines the morning of the procedure. What happens during the procedure?  You may be given a medicine to help you relax (sedative) before and during the procedure. This medicine is given through an IV access tube that is inserted into one of your veins.  The area where the catheter will be inserted will be washed and shaved. This is usually done in the groin but may be done in the fold of your arm (near your elbow) or in the wrist.  A medicine will be given to numb the area where the catheter will be inserted (local anesthetic).  The catheter will be inserted with a guide wire into an artery. The catheter is guided by using a type of X-ray (fluoroscopy) to the blood vessel being examined.  Dye is then injected into the catheter, and X-rays are taken. The dye helps to show where any narrowing or blockages are located. What happens after the procedure?  If the procedure is done through the leg, you will be kept in bed lying flat for several hours. You will be instructed to not bend or cross your legs.  The insertion site will be checked frequently.  The pulse in your feet or wrist will be checked frequently.  Additional blood tests, X-rays, and electrocardiography may be done.  You may need to stay in the hospital overnight for observation. This information is not intended to replace advice given to you by your health care provider. Make sure you discuss any questions you have with your health care provider. Document Released: 01/04/2005 Document Revised: 09/08/2015 Document Reviewed: 08/28/2012 Elsevier Interactive Patient Education  2017 [place] After This sheet gives you information about how to care for yourself after your procedure. Your health care provider may also give you more specific instructions. If you have problems or questions, contact your health care provider. What can I expect after the  procedure? After the procedure, it is common to have bruising and tenderness at the catheter insertion area. Follow these instructions at home: Insertion site care  Follow instructions from your health care provider about how to take care of your insertion site. Make sure you: ? Wash your hands with soap and water before you change your bandage (dressing). If soap and water are not available, use hand sanitizer. ? Change your dressing as told by your health care provider. ? Leave stitches (sutures), skin glue, or adhesive strips in place. These skin closures may need to stay in place for 2 weeks or longer. If adhesive strip edges start to loosen and curl up, you may trim the loose edges. Do not remove adhesive strips completely unless your health care provider tells you to do that.  Do not take baths, swim, or use a hot tub until your health care provider approves.  You may shower 24-48 hours after the procedure or as told by your health care provider. ? Gently wash the site with plain soap and water. ? Pat the area dry with a clean towel. ? Do not rub the site. This may cause bleeding.  Do not apply powder  or lotion to the site. Keep the site clean and dry.  Check your insertion site every day for signs of infection. Check for: ? Redness, swelling, or pain. ? Fluid or blood. ? Warmth. ? Pus or a bad smell. Activity  Rest as told by your health care provider, usually for 1-2 days.  Do not lift anything that is heavier than 10 lbs. (4.5 kg) or as told by your health care provider.  Do not drive for 24 hours if you were given a medicine to help you relax (sedative).  Do not drive or use heavy machinery while taking prescription pain medicine. General instructions  Return to your normal activities as told by your health care provider, usually in about a week. Ask your health care provider what activities are safe for you.  If the catheter site starts bleeding, lie flat and put  pressure on the site. If the bleeding does not stop, get help right away. This is a medical emergency.  Drink enough fluid to keep your urine clear or pale yellow. This helps flush the contrast dye from your body.  Take over-the-counter and prescription medicines only as told by your health care provider.  Keep all follow-up visits as told by your health care provider. This is important. Contact a health care provider if:  You have a fever or chills.  You have redness, swelling, or pain around your insertion site.  You have fluid or blood coming from your insertion site.  The insertion site feels warm to the touch.  You have pus or a bad smell coming from your insertion site.  You have bruising around the insertion site.  You notice blood collecting in the tissue around the catheter site (hematoma). The hematoma may be painful to the touch. Get help right away if:  You have severe pain at the catheter insertion area.  The catheter insertion area swells very fast.  The catheter insertion area is bleeding, and the bleeding does not stop when you hold steady pressure on the area.  The area near or just beyond the catheter insertion site becomes pale, cool, tingly, or numb. These symptoms may represent a serious problem that is an emergency. Do not wait to see if the symptoms will go away. Get medical help right away. Call your local emergency services (911 in the [place] Do not drive yourself to the hospital. Summary  After the procedure, it is common to have bruising and tenderness at the catheter insertion area.  After the procedure, it is important to rest and drink plenty of fluids.  Do not take baths, swim, or use a hot tub until your health care provider says it is okay to do so. You may shower 24-48 hours after the procedure or as told by your health care provider.  If the catheter site starts bleeding, lie flat and put pressure on the site. If the bleeding does not stop,  get help right away. This is a medical emergency. This information is not intended to replace advice given to you by your health care provider. Make sure you discuss any questions you have with your health care provider. Document Released: 10/13/2004 Document Revised: 03/01/2016 Document Reviewed: 03/01/2016 Elsevier Interactive Patient Education  2017 [employer].

## 2016-12-21 NOTE — Progress Notes (Signed)
Cardiology Office Note   Date:  12/21/2016   ID:  Meghan [name], DOB [birth year], MRN [id number]  PCP:  Sheran Fava, MD  Cardiologist:   Kathlyn Sacramento, MD  Endocrinologist: Dr. Atha Starks  Chief Complaint  Patient presents with  . other    Follow up from Echo. Meds reviewed by the pt. verbally. Pt. c/o shortness of breath.       History of Present Illness: Meghan [name] is a 66 y.o. female who presents for a followup visit regarding Chronic systolic heart failure, coronary artery disease and persistent atrial fibrillation. Other medical problems include hypertension, 2 diabetes, hyperlipidemia and carotid artery disease status post left carotid endarterectomy with previous CVA. She never had cardiac cath in the past. She had worsening heart failure in the setting of atrial fibrillation with rapid ventricular response.  She was treated with amiodarone with restoration of sinus rhythm.  She had a repeat echocardiogram on August 29 which showed severely reduced LV systolic function with an EF of 20-25%, mild mitral regurgitation, moderate to severe tricuspid regurgitation and moderate pulmonary hypertension with systolic pulmonary pressure around 50 mmHg.  She ran out of furosemide about 3-4 weeks ago and could not get refills from her pharmacy. She did not call our office. Unfortunately, she gained 25 pounds with worsening dyspnea, abdominal swelling and leg edema. No chest pain.  Past Medical History:  Diagnosis Date  . Cardiomyopathy ([place])    a. 06/2013 Echo: EF 35-40%, glob HK, mildly dil LA, mild LVH, mild TR, mild to mod MR; b. 07/2016 Echo: EF 25-30%, sev mid-apicalanteroseptal, ant, apical HK, mild MR, sev dil LA, mod dil RA, mod TR, PASP 56mmHg.  . Carotid arterial disease ([place])    a. 02/2012 s/p L CEA.  . Coronary artery disease    a. Presumed - 02/2012 [facility] MV: Anterior, anteroseptal, septal, apical infarct with mild peri-infarct ischemia, EF 41%- medically managed.   . 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 Hyperlipidemia   . Hypertension   . Morbid obesity ([place])   . Persistent atrial fibrillation (HCC)    a. CHA2DS2VASc = 7-->coumadin; maintaining sinus on amio.  . Stroke [facility]) 2013    Past Surgical History:  Procedure Laterality Date  . AMPUTATION TOE Left 09/22/2016   Procedure: AMPUTATION TOE;  Surgeon: Albertine Patricia, DPM;  Location: [facility] ORS;  Service: Podiatry;  Laterality: Left;  . CARDIOVERSION N/A 07/28/2016   Procedure: CARDIOVERSION;  Surgeon: Wellington Hampshire, MD;  Location: [facility] ORS;  Service: Cardiovascular;  Laterality: N/A;  . CARDIOVERSION N/A 08/07/2016   Procedure: CARDIOVERSION;  Surgeon: Wellington Hampshire, MD;  Location: [facility] ORS;  Service: Cardiovascular;  Laterality: N/A;  . CAROTID ENDARTERECTOMY     [facility]; Dr. Lucky Cowboy  . CHOLECYSTECTOMY    . LEG SURGERY     right;infection  . TONSILLECTOMY       Current Outpatient Prescriptions  Medication Sig Dispense Refill  . amiodarone (PACERONE) 200 MG tablet Take 1 tablet (200 mg total) by mouth daily. 90 tablet 3  . atorvastatin (LIPITOR) 10 MG tablet TAKE ONE TABLET BY MOUTH ONCE DAILY 90 tablet 3  . glipiZIDE (GLUCOTROL) 10 MG tablet Take 10 mg by mouth 2 (two) times daily.    Marland Kitchen HYDROcodone-acetaminophen (NORCO) 5-325 MG tablet Take 1 tablet by mouth every 6 (six) hours as needed for moderate pain. 15 tablet 0  . lisinopril (PRINIVIL,ZESTRIL) 10 MG tablet Take 1 tablet (10 mg total) by  mouth daily. 90 tablet 3  . metFORMIN (GLUCOPHAGE) 500 MG tablet Take 500 mg by mouth 2 (two) times daily.     . metoprolol succinate (TOPROL-XL) 100 MG 24 hr tablet Take 1 tablet (100 mg total) by mouth daily. Take with or immediately following a meal. 90 tablet 2  . spironolactone (ALDACTONE) 25 MG tablet Take 1 tablet (25 mg total) by mouth daily. 30 tablet 3  . warfarin (COUMADIN) 5 MG tablet TAKE 1 TABLET BY MOUTH ONCE DAILY OR AS DIRECT BY COUMADIN CLINIC 30 tablet 3  .  furosemide (LASIX) 40 MG tablet Take 1 tablet (40 mg total) by mouth daily. (Patient not taking: Reported on 12/21/2016) 30 tablet 5   No current facility-administered medications for this visit.     Allergies:   Patient has no known allergies.    Social History:  The patient  reports that she has quit smoking. Her smoking use included Cigarettes. She has a 2.50 pack-year smoking history. She has never used smokeless tobacco. She reports that she does not drink alcohol or use drugs.   Family History:  The patient's family history includes Heart disease in her mother.    ROS:  Please see the history of present illness.   Otherwise, review of systems are positive for none.   All other systems are reviewed and negative.    PHYSICAL EXAM: VS:  BP 122/60 (BP Location: Left Arm, Patient Position: Sitting, Cuff Size: Normal)   Pulse (!) 55   Ht 5\' 7"  (1.702 m)   Wt 256 lb 12 oz (116.5 kg)   BMI 40.21 kg/m  , BMI Body mass index is 40.21 kg/m. GEN: Well nourished, well developed, in no acute distress  HEENT: normal  Neck: Mild JVD, carotid bruits, or masses Cardiac:RRR; no murmurs, rubs, or gallops, mild bilateral leg edema  Respiratory:  Bibasilar crackles all the way up to half lungs, normal work of breathing GI: soft, nontender, mild abdominal distention, + BS MS: no deformity or atrophy  Skin: warm and dry, no rash Neuro:  Strength and sensation are intact Psych: euthymic mood, full affect   EKG:  EKG is ordered today. The ekg ordered today demonstrates sinus bradycardia with first-degree AV block. Old inferior and anteroseptal infarct.  Recent Labs: 07/26/2016: ALT 13; B Natriuretic Peptide 1,062.0; TSH 5.260 09/21/2016: Magnesium 1.3 09/24/2016: Hemoglobin 8.6; Platelets 459 10/25/2016: BUN 26; Creatinine, Ser 1.35; Potassium 4.5; Sodium 139    Lipid Panel    Component Value Date/Time   CHOL 165 02/14/2012 0410   TRIG 168 02/14/2012 0410   HDL 31 (L) 02/14/2012 0410    VLDL 34 02/14/2012 0410   LDLCALC 100 02/14/2012 0410      Wt Readings from Last 3 Encounters:  12/21/16 256 lb 12 oz (116.5 kg)  10/25/16 231 lb 12 oz (105.1 kg)  09/22/16 244 lb (110.7 kg)       ASSESSMENT AND PLAN:  1. Acute on chronic systolic heart failure: Repeat echocardiogram showed persistently low ejection fraction in spite of optimal medical therapy. The patient is currently volume overloaded likely due to not taking furosemide for about a month. I explained to the importance of taking a diuretic and resume furosemide 40 mg once daily. Given persistent ejection fraction below 35% with no improvement, I recommend proceeding with a right and left cardiac catheterization. I discussed the procedure in detail as well as risk and benefits.  2. Persistent atrial fibrillation:  She is maintaining in sinus rhythm with  amiodarone and Toprol. She is on anticoagulation with warfarin. Hold warfarin 3 days before cardiac catheterization.  3. Essential hypertension: Blood pressure is Controlled.  4. Coronary artery disease involving native coronary arteries with other forms of angina: I suspect the patient will likely have three-vessel coronary artery disease.  5. Hyperlipidemia: Continue treatment with atorvastatin. She will require follow-up lipid and liver profile in the near future.   Disposition:   FU with me in 1 months  Signed,  Kathlyn Sacramento, MD  12/21/2016 8:10 AM    [place]

## 2016-12-21 NOTE — Telephone Encounter (Signed)
Pt scheduled for 9/17 cath. Most likely call was from [facility]. Returned call. No answer, no VM set up on home phone.

## 2016-12-22 LAB — BASIC METABOLIC PANEL
BUN / CREAT RATIO: 18 (ref 12–28)
BUN: 24 mg/dL (ref 8–27)
CO2: 19 mmol/L — ABNORMAL LOW (ref 20–29)
CREATININE: 1.34 mg/dL — AB (ref 0.57–1.00)
Calcium: 8.6 mg/dL — ABNORMAL LOW (ref 8.7–10.3)
Chloride: 99 mmol/L (ref 96–106)
GFR, EST AFRICAN AMERICAN: 47 mL/min/{1.73_m2} — AB (ref >59)
GFR, EST NON AFRICAN AMERICAN: 41 mL/min/{1.73_m2} — AB (ref >59)
GLUCOSE: 94 mg/dL (ref 65–99)
Potassium: 5.4 mmol/L — ABNORMAL HIGH (ref 3.5–5.2)
Sodium: 134 mmol/L (ref 134–144)

## 2016-12-22 LAB — CBC
HEMATOCRIT: 27.6 % — AB (ref 34.0–46.6)
Hemoglobin: 7.9 g/dL — ABNORMAL LOW (ref 11.1–15.9)
MCH: 20.2 pg — ABNORMAL LOW (ref 26.6–33.0)
MCHC: 28.6 g/dL — ABNORMAL LOW (ref 31.5–35.7)
MCV: 71 fL — ABNORMAL LOW (ref 79–97)
Platelets: 357 10*3/uL (ref 150–379)
RBC: 3.91 x10E6/uL (ref 3.77–5.28)
RDW: 20.9 % — AB (ref 12.3–15.4)
WBC: 4.8 10*3/uL (ref 3.4–10.8)

## 2016-12-25 ENCOUNTER — Encounter: Payer: Self-pay | Admitting: Family Medicine

## 2016-12-25 ENCOUNTER — Ambulatory Visit (INDEPENDENT_AMBULATORY_CARE_PROVIDER_SITE_OTHER): Payer: Medicare Other | Admitting: Family Medicine

## 2016-12-25 ENCOUNTER — Telehealth: Payer: Self-pay | Admitting: Cardiovascular Disease

## 2016-12-25 ENCOUNTER — Ambulatory Visit: Admission: RE | Admit: 2016-12-25 | Payer: Medicare Other | Source: Ambulatory Visit | Admitting: Cardiovascular Disease

## 2016-12-25 ENCOUNTER — Encounter: Admission: RE | Payer: Self-pay | Source: Ambulatory Visit

## 2016-12-25 VITALS — BP 122/82 | HR 54 | Resp 16 | Ht 67.0 in | Wt 256.7 lb

## 2016-12-25 DIAGNOSIS — L409 Psoriasis, unspecified: Secondary | ICD-10-CM | POA: Diagnosis not present

## 2016-12-25 DIAGNOSIS — D649 Anemia, unspecified: Secondary | ICD-10-CM | POA: Diagnosis not present

## 2016-12-25 DIAGNOSIS — E785 Hyperlipidemia, unspecified: Secondary | ICD-10-CM | POA: Diagnosis not present

## 2016-12-25 DIAGNOSIS — N183 Chronic kidney disease, stage 3 unspecified: Secondary | ICD-10-CM

## 2016-12-25 DIAGNOSIS — D509 Iron deficiency anemia, unspecified: Secondary | ICD-10-CM | POA: Insufficient documentation

## 2016-12-25 DIAGNOSIS — Z8673 Personal history of transient ischemic attack (TIA), and cerebral infarction without residual deficits: Secondary | ICD-10-CM

## 2016-12-25 DIAGNOSIS — E1142 Type 2 diabetes mellitus with diabetic polyneuropathy: Secondary | ICD-10-CM

## 2016-12-25 DIAGNOSIS — I10 Essential (primary) hypertension: Secondary | ICD-10-CM

## 2016-12-25 DIAGNOSIS — Z23 Encounter for immunization: Secondary | ICD-10-CM | POA: Diagnosis not present

## 2016-12-25 DIAGNOSIS — I25119 Atherosclerotic heart disease of native coronary artery with unspecified angina pectoris: Secondary | ICD-10-CM | POA: Diagnosis not present

## 2016-12-25 DIAGNOSIS — I480 Paroxysmal atrial fibrillation: Secondary | ICD-10-CM

## 2016-12-25 DIAGNOSIS — I5022 Chronic systolic (congestive) heart failure: Secondary | ICD-10-CM | POA: Diagnosis not present

## 2016-12-25 DIAGNOSIS — R2681 Unsteadiness on feet: Secondary | ICD-10-CM

## 2016-12-25 DIAGNOSIS — I209 Angina pectoris, unspecified: Secondary | ICD-10-CM

## 2016-12-25 SURGERY — RIGHT AND LEFT HEART CATH
Anesthesia: Moderate Sedation | Laterality: Bilateral

## 2016-12-25 NOTE — Telephone Encounter (Signed)
[facility] to request urgent appointment for patient. Patient scheduled for appointment today, 12/25/16 at 2 pm with Dr Vicente Masson.  Patient notified and verbalized understanding of appointment date, time and locations.

## 2016-12-25 NOTE — Telephone Encounter (Signed)
Notes recorded by Wellington Hampshire, MD on 12/22/2016 at 2:48 PM EDT precath labs with significant anemia. [place] cath. She needs to see her PCP ASAP for evaluation of anemia. [place] K should improve with resuming Lasix.          Notes recorded by Georgiana Shore, RN on 12/22/2016 at 3:10 PM EDT Left message [facility] Scheduling to cancel Monday cath. Notified Meghan [name] and Meghan [name] in the Cath Lab to cancel. Notified pt of results and recommendations.  Pt states she does not have a PCP. Willing to establish care w/[facility] PCP in [place]. [facility]. Office closed today due to inclement weather.  Routed to Marissa Calamity, RN, to call on Monday.

## 2016-12-26 DIAGNOSIS — L409 Psoriasis, unspecified: Secondary | ICD-10-CM | POA: Insufficient documentation

## 2016-12-26 LAB — HEMOCCULT GUIAC POC 1CARD (OFFICE): Fecal Occult Blood, POC: NEGATIVE

## 2016-12-26 NOTE — Progress Notes (Signed)
Date:  12/25/2016   Name:  Meghan [name]   DOB:  12/18/1950   MRN:  [id number]  PCP:  Plonk, William, MD    Chief Complaint: Establish Care   History of Present Illness:  This is a 66 y.o. female seen for initial visit. Referred here by Dr. Arida for evaluation of worsened anemia noted during preop for cardiac cath. On warfarin but denies abnormal bleeding, Fe sat low but folate/B12 normal in June. Colonoscopy ok 2 yrs ago. Hx chronic systolic CHF on Aldactone/metoprolol/lisinopril/Lasix. Just back on Lasix x 3d due to increased weight. HLD on Lipitor, T2DM on metformin/glipizide. Pafib on amiodarone/warfarin. Osteomyelitis L toe in June s/p amputation. CVA 2 yrs ago no sequelae. Extensive psoriasis, has not seen derm. Father died MVA, mother died DM/heart dz 60s, sibs with DM/heart dz. Mammo 2 yrs ago normal  Review of Systems:  Review of Systems  Constitutional: Negative for chills and fever.  Respiratory: Negative for cough.   Cardiovascular: Negative for chest pain.  Gastrointestinal: Negative for abdominal pain, constipation and diarrhea.  Endocrine: Negative for polydipsia and polyuria.  Genitourinary: Negative for difficulty urinating.  Neurological: Negative for syncope and light-headedness.    Patient Active Problem List   Diagnosis Date Noted  . Psoriasis 12/26/2016  . Obesity, Class III, BMI 40-49.9 (morbid obesity) (HCC) 12/26/2016  . Anemia 12/25/2016  . Toe osteomyelitis, left (HCC) 09/21/2016  . CKD (chronic kidney disease), stage III 07/26/2016  . History of CVA (cerebrovascular accident) without residual deficits 09/03/2013  . Hyperlipidemia 09/03/2013  . Type 2 diabetes mellitus with peripheral neuropathy (HCC) 09/03/2013  . Paroxysmal atrial fibrillation (HCC) 07/16/2013  . Ischemic cardiomyopathy 07/16/2013  . Hypertension, essential, benign 07/16/2013  . Myocardial ischemia 07/16/2013  . Chronic systolic heart failure (HCC) 04/12/2012  . Coronary  atherosclerosis   . Dyspnea 03/01/2012    Prior to Admission medications   Medication Sig Start Date End Date Taking? Authorizing Provider  amiodarone (PACERONE) 200 MG tablet Take 1 tablet (200 mg total) by mouth daily. Patient taking differently: Take 200 mg by mouth daily at 12 noon.  09/18/16  Yes Arida, Muhammad A, MD  atorvastatin (LIPITOR) 10 MG tablet TAKE ONE TABLET BY MOUTH ONCE DAILY Patient taking differently: TAKE ONE TABLET BY MOUTH ONCE DAILY AT NOON 10/23/16  Yes Arida, Muhammad A, MD  furosemide (LASIX) 40 MG tablet Take 1 tablet (40 mg total) by mouth daily. Patient taking differently: Take 40 mg by mouth daily at 12 noon.  12/21/16  Yes Arida, Muhammad A, MD  glipiZIDE (GLUCOTROL) 10 MG tablet Take 10 mg by mouth 2 (two) times daily. 08/13/15  Yes [provider]  lisinopril (PRINIVIL,ZESTRIL) 10 MG tablet Take 1 tablet (10 mg total) by mouth daily. Patient taking differently: Take 10 mg by mouth daily at 12 noon.  08/17/16 12/22/17 Yes Arida, Muhammad A, MD  metFORMIN (GLUCOPHAGE) 500 MG tablet Take 500 mg by mouth 2 (two) times daily.  06/18/16  Yes [provider]  metoprolol succinate (TOPROL-XL) 100 MG 24 hr tablet Take 1 tablet (100 mg total) by mouth daily. Take with or immediately following a meal. Patient taking differently: Take 100 mg by mouth daily at 12 noon. Take with or immediately following a meal. 08/17/16  Yes Arida, Muhammad A, MD  spironolactone (ALDACTONE) 25 MG tablet Take 1 tablet (25 mg total) by mouth daily. Patient taking differently: Take 25 mg by mouth daily at 12 noon.  09/18/16 12/22/17 Yes Arida, Muhammad A, MD    warfarin (COUMADIN) 5 MG tablet TAKE 1 TABLET BY MOUTH ONCE DAILY OR AS DIRECT BY COUMADIN CLINIC Patient taking differently: TAKE 1 TABLET BY MOUTH ONCE DAILY AT NOON 11/24/16  Yes Arida, Muhammad A, MD    No Known Allergies  Past Surgical History:  Procedure Laterality Date  . AMPUTATION TOE Left 09/22/2016   Procedure:  AMPUTATION TOE;  Surgeon: Troxler, Matthew, DPM;  Location: [facility];  Service: Podiatry;  Laterality: Left;  . CARDIOVERSION N/A 07/28/2016   Procedure: CARDIOVERSION;  Surgeon: Muhammad A Arida, MD;  Location: [facility];  Service: Cardiovascular;  Laterality: N/A;  . CARDIOVERSION N/A 08/07/2016   Procedure: CARDIOVERSION;  Surgeon: Muhammad A Arida, MD;  Location: [facility];  Service: Cardiovascular;  Laterality: N/A;  . CAROTID ENDARTERECTOMY     [facility]; Dr. Dew  . CHOLECYSTECTOMY    . LEG SURGERY     right;infection  . TONSILLECTOMY      Social History  Substance Use Topics  . Smoking status: Former Smoker    Packs/day: 0.50    Years: 5.00    Types: Cigarettes  . Smokeless tobacco: Never Used  . Alcohol use No    Family History  Problem Relation Age of Onset  . Heart disease Mother     Medication list has been reviewed and updated.  Physical Examination: BP 122/82   Pulse (!) 54   Resp 16   Ht 5' 7" (1.702 m)   Wt 256 lb 11.2 oz (116.4 kg)   SpO2 98%   BMI 40.20 kg/m   Physical Exam  Constitutional: She is oriented to person, place, and time. She appears well-developed and well-nourished.  HENT:  Head: Normocephalic and atraumatic.  Right Ear: External ear normal.  Left Ear: External ear normal.  Nose: Nose normal.  Mouth/Throat: Oropharynx is clear and moist.  TMs clear  Eyes: Pupils are equal, round, and reactive to light. EOM are normal. No scleral icterus.  Neck: Neck supple. No thyromegaly present.  Cardiovascular: Normal rate, regular rhythm and normal heart sounds.   Pulmonary/Chest: Effort normal.  Mild bibasilar rales  Abdominal: Soft. She exhibits no distension and no mass. There is no tenderness.  Musculoskeletal:  1+ LLE edema, trace RLE edema  Lymphadenopathy:    She has no cervical adenopathy.  Neurological: She is alert and oriented to person, place, and time.  Romberg pos, gait wide-based, unsteady  Skin: Skin is warm and dry.  Extensive  psoriatic plaques  Psychiatric: She has a normal mood and affect. Her behavior is normal.  Nursing note and vitals reviewed.   Assessment and Plan:  1. Chronic systolic heart failure (HCC) Improving with addition Lasix to current regimen, cards following - Comprehensive Metabolic Panel (CMET) - TSH  2. Atherosclerosis of native coronary artery of native heart with angina pectoris (HCC) For cardiac cath when anemia stable  3. Paroxysmal atrial fibrillation (HCC) On amiodarone/warfarin with stable monthly INRs, cards following  4. Anemia, unspecified type ACKD vs. Fe def (low sat in June), stool guaic negative - Erythropoietin - CBC - Fe+TIBC+Fer - POCT Occult Blood Stool  5. Hypertension, essential, benign Well controlled on current regimen  6. Type 2 diabetes mellitus with peripheral neuropathy (HCC) Unclear control on metformin/glipizide, was seeing endo but moved - HgB A1c - Urine Microalbumin w/creat. ratio  7. CKD (chronic kidney disease), stage III eGFR 41 last week  8. Hyperlipidemia, unspecified hyperlipidemia type On Lipitor - Lipid Profile  9. Gait instability - B12  10. Psoriasis Extensive,   consider derm referral  11. Obesity, Class III, BMI 40-49.9 (morbid obesity) (HCC) Unable to exercise due to neuropathy/gait instability  12. History of CVA (cerebrovascular accident) without residual deficits On warfarin/statin  13. Need for influenza vaccination - Flu vaccine HIGH DOSE PF (Fluzone High dose)  14. Need for pneumococcal vaccination - Pneumococcal conjugate vaccine 13-valent  15. HM Consider Tdap/zoster imms next visit  Return in about 4 weeks (around 01/22/2017).   45 mins spent with pt/family over half in counseling  Simon Aaberg M. [facility]  12/26/2016

## 2016-12-27 DIAGNOSIS — R2681 Unsteadiness on feet: Secondary | ICD-10-CM | POA: Diagnosis not present

## 2016-12-27 DIAGNOSIS — I5022 Chronic systolic (congestive) heart failure: Secondary | ICD-10-CM | POA: Diagnosis not present

## 2016-12-27 DIAGNOSIS — D649 Anemia, unspecified: Secondary | ICD-10-CM | POA: Diagnosis not present

## 2016-12-27 DIAGNOSIS — E785 Hyperlipidemia, unspecified: Secondary | ICD-10-CM | POA: Diagnosis not present

## 2016-12-27 DIAGNOSIS — E1142 Type 2 diabetes mellitus with diabetic polyneuropathy: Secondary | ICD-10-CM | POA: Diagnosis not present

## 2016-12-29 ENCOUNTER — Other Ambulatory Visit: Payer: Self-pay | Admitting: Family Medicine

## 2016-12-29 ENCOUNTER — Encounter: Payer: Self-pay | Admitting: Family Medicine

## 2016-12-29 DIAGNOSIS — R7989 Other specified abnormal findings of blood chemistry: Secondary | ICD-10-CM | POA: Insufficient documentation

## 2016-12-29 LAB — TSH: TSH: 5.78 u[IU]/mL — ABNORMAL HIGH (ref 0.450–4.500)

## 2016-12-29 LAB — COMPREHENSIVE METABOLIC PANEL
ALBUMIN: 3.6 g/dL (ref 3.6–4.8)
ALT: 27 IU/L (ref 0–32)
AST: 40 IU/L (ref 0–40)
Albumin/Globulin Ratio: 0.8 — ABNORMAL LOW (ref 1.2–2.2)
Alkaline Phosphatase: 119 IU/L — ABNORMAL HIGH (ref 39–117)
BUN/Creatinine Ratio: 19 (ref 12–28)
BUN: 27 mg/dL (ref 8–27)
Bilirubin Total: 1.2 mg/dL (ref 0.0–1.2)
CO2: 24 mmol/L (ref 20–29)
CREATININE: 1.43 mg/dL — AB (ref 0.57–1.00)
Calcium: 8.5 mg/dL — ABNORMAL LOW (ref 8.7–10.3)
Chloride: 99 mmol/L (ref 96–106)
GFR calc Af Amer: 43 mL/min/{1.73_m2} — ABNORMAL LOW (ref >59)
GFR, EST NON AFRICAN AMERICAN: 38 mL/min/{1.73_m2} — AB (ref >59)
GLOBULIN, TOTAL: 4.3 g/dL (ref 1.5–4.5)
Glucose: 93 mg/dL (ref 65–99)
Potassium: 5.1 mmol/L (ref 3.5–5.2)
SODIUM: 136 mmol/L (ref 134–144)
TOTAL PROTEIN: 7.9 g/dL (ref 6.0–8.5)

## 2016-12-29 LAB — IRON,TIBC AND FERRITIN PANEL
Ferritin: 13 ng/mL — ABNORMAL LOW (ref 15–150)
IRON SATURATION: 4 % — AB (ref 15–55)
Iron: 19 ug/dL — ABNORMAL LOW (ref 27–139)
Total Iron Binding Capacity: 443 ug/dL (ref 250–450)
UIBC: 424 ug/dL — ABNORMAL HIGH (ref 118–369)

## 2016-12-29 LAB — ERYTHROPOIETIN: Erythropoietin: 113.1 m[IU]/mL — ABNORMAL HIGH (ref 2.6–18.5)

## 2016-12-29 LAB — CBC
HEMATOCRIT: 25.7 % — AB (ref 34.0–46.6)
HEMOGLOBIN: 7.4 g/dL — AB (ref 11.1–15.9)
MCH: 20.1 pg — AB (ref 26.6–33.0)
MCHC: 28.8 g/dL — ABNORMAL LOW (ref 31.5–35.7)
MCV: 70 fL — AB (ref 79–97)
Platelets: 296 10*3/uL (ref 150–379)
RBC: 3.69 x10E6/uL — AB (ref 3.77–5.28)
RDW: 20.4 % — ABNORMAL HIGH (ref 12.3–15.4)
WBC: 5.1 10*3/uL (ref 3.4–10.8)

## 2016-12-29 LAB — MICROALBUMIN / CREATININE URINE RATIO
CREATININE, UR: 71.5 mg/dL
MICROALBUM., U, RANDOM: 66.5 ug/mL
Microalb/Creat Ratio: 93 mg/g creat — ABNORMAL HIGH (ref 0.0–30.0)

## 2016-12-29 LAB — LIPID PANEL
CHOL/HDL RATIO: 2.9 ratio (ref 0.0–4.4)
Cholesterol, Total: 67 mg/dL — ABNORMAL LOW (ref 100–199)
HDL: 23 mg/dL — ABNORMAL LOW (ref >39)
LDL CALC: 35 mg/dL (ref 0–99)
Triglycerides: 45 mg/dL (ref 0–149)
VLDL CHOLESTEROL CAL: 9 mg/dL (ref 5–40)

## 2016-12-29 LAB — HEMOGLOBIN A1C
Est. average glucose Bld gHb Est-mCnc: 180 mg/dL
Hgb A1c MFr Bld: 7.9 % — ABNORMAL HIGH (ref 4.8–5.6)

## 2016-12-29 LAB — VITAMIN B12: VITAMIN B 12: 689 pg/mL (ref 232–1245)

## 2016-12-29 MED ORDER — IRON 325 (65 FE) MG PO TABS: 1.0000 | ORAL_TABLET | Freq: Every day | ORAL | 0 refills | Status: DC

## 2016-12-29 MED ORDER — AMIODARONE HCL 100 MG PO TABS: 100.0000 mg | ORAL_TABLET | Freq: Every day | ORAL | 2 refills | Status: DC

## 2016-12-29 MED ORDER — LISINOPRIL 20 MG PO TABS: 20.0000 mg | ORAL_TABLET | Freq: Every day | ORAL | 2 refills | Status: DC

## 2017-01-03 ENCOUNTER — Ambulatory Visit (INDEPENDENT_AMBULATORY_CARE_PROVIDER_SITE_OTHER): Payer: Medicare Other

## 2017-01-03 ENCOUNTER — Other Ambulatory Visit: Payer: Self-pay | Admitting: Family Medicine

## 2017-01-03 DIAGNOSIS — Z5181 Encounter for therapeutic drug level monitoring: Secondary | ICD-10-CM

## 2017-01-03 DIAGNOSIS — I4891 Unspecified atrial fibrillation: Secondary | ICD-10-CM | POA: Diagnosis not present

## 2017-01-03 LAB — POCT INR: INR: 2.3

## 2017-01-03 MED ORDER — AMIODARONE HCL 200 MG PO TABS: 100.0000 mg | ORAL_TABLET | Freq: Every day | ORAL | 2 refills | Status: DC

## 2017-01-26 DIAGNOSIS — E1159 Type 2 diabetes mellitus with other circulatory complications: Secondary | ICD-10-CM | POA: Diagnosis not present

## 2017-01-26 DIAGNOSIS — E1169 Type 2 diabetes mellitus with other specified complication: Secondary | ICD-10-CM | POA: Diagnosis not present

## 2017-01-26 DIAGNOSIS — I10 Essential (primary) hypertension: Secondary | ICD-10-CM | POA: Diagnosis not present

## 2017-01-26 DIAGNOSIS — E1142 Type 2 diabetes mellitus with diabetic polyneuropathy: Secondary | ICD-10-CM | POA: Diagnosis not present

## 2017-01-26 DIAGNOSIS — E785 Hyperlipidemia, unspecified: Secondary | ICD-10-CM | POA: Diagnosis not present

## 2017-01-30 ENCOUNTER — Encounter: Payer: Self-pay | Admitting: Nurse Practitioner

## 2017-01-30 ENCOUNTER — Ambulatory Visit (INDEPENDENT_AMBULATORY_CARE_PROVIDER_SITE_OTHER): Payer: Medicare Other | Admitting: Nurse Practitioner

## 2017-01-30 VITALS — BP 124/70 | HR 60 | Ht 67.0 in | Wt 233.2 lb

## 2017-01-30 DIAGNOSIS — I429 Cardiomyopathy, unspecified: Secondary | ICD-10-CM | POA: Diagnosis not present

## 2017-01-30 DIAGNOSIS — I5022 Chronic systolic (congestive) heart failure: Secondary | ICD-10-CM

## 2017-01-30 DIAGNOSIS — I10 Essential (primary) hypertension: Secondary | ICD-10-CM

## 2017-01-30 DIAGNOSIS — E119 Type 2 diabetes mellitus without complications: Secondary | ICD-10-CM

## 2017-01-30 DIAGNOSIS — D649 Anemia, unspecified: Secondary | ICD-10-CM | POA: Diagnosis not present

## 2017-01-30 DIAGNOSIS — E782 Mixed hyperlipidemia: Secondary | ICD-10-CM

## 2017-01-30 DIAGNOSIS — I25119 Atherosclerotic heart disease of native coronary artery with unspecified angina pectoris: Secondary | ICD-10-CM

## 2017-01-30 NOTE — Progress Notes (Signed)
Office Visit    Patient Name: Meghan [name] Date of Encounter: 01/30/2017  Primary Care Provider:  Adline Potter, MD Primary Cardiologist:  Jerilynn Mages. Fletcher Anon, MD   Chief Complaint    66 y/o ? with a prior history of cardiomyopathy presumed to be ischemic, presumed CAD, hypertension, hyperlipidemia, diabetes, stroke, carotid arterial disease, and paroxysmal atrial fibrillation, who presents for follow-up.  Past Medical History    Past Medical History:  Diagnosis Date  . Cardiomyopathy ([place])    a. 06/2013 Echo: EF 35-40%, glob HK, mildly dil LA, mild LVH, mild TR, mild to mod MR; b. 07/2016 Echo: EF 25-30%, sev mid-apicalanteroseptal, ant, apical HK, mild MR, sev dil LA, mod dil RA, mod TR, PASP 21mmHg; c. 11/2016 Echo: EF 20-25%, antsept DK, Gr2 DD, mild MR, mod dil LA/RV/RA, mod red RV fxn, mod-sev TR, PASP 40-40mmHg.  . Carotid arterial disease ([place])    a. 02/2012 s/p L CEA.  . Coronary artery disease    a. Presumed - 02/2012 Lexi MV: Anterior, anteroseptal, septal, apical infarct with mild peri-infarct ischemia, EF 41%- medically managed.  . 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 Hyperlipidemia   . Hypertension   . Morbid obesity ([place])   . Persistent atrial fibrillation (HCC)    a. CHA2DS2VASc = 7-->coumadin; maintaining sinus on amio.  . Stroke [facility]) 2013   Past Surgical History:  Procedure Laterality Date  . AMPUTATION TOE Left 09/22/2016   Procedure: AMPUTATION TOE;  Surgeon: Albertine Patricia, DPM;  Location: [facility];  Service: Podiatry;  Laterality: Left;  . CARDIOVERSION N/A 07/28/2016   Procedure: CARDIOVERSION;  Surgeon: Wellington Hampshire, MD;  Location: [facility];  Service: Cardiovascular;  Laterality: N/A;  . CARDIOVERSION N/A 08/07/2016   Procedure: CARDIOVERSION;  Surgeon: Wellington Hampshire, MD;  Location: [facility];  Service: Cardiovascular;  Laterality: N/A;  . CAROTID ENDARTERECTOMY     [facility]; Dr. Lucky Cowboy  . CHOLECYSTECTOMY    . LEG SURGERY     right;infection  . TONSILLECTOMY      Allergies  No Known Allergies  History of Present Illness    66 year old female with the above complex past medical history including presumed CAD in the setting of abnormal stress test in November 2013, revealing anterior, anteroseptal, septal, apical infarct with mild peri-infarct ischemia.  She also has LV dysfunction with an EF of 25-30% by echo in April 2018 in the setting of A. fib.  Other history includes hypertension, hyperlipidemia, diabetes, obesity, and stroke.  She was noted to be back in sinus rhythm in June 2013 and we followed up an echocardiogram in August which unfortunately showed persistent LV dysfunction with an EF of 20-25%.  Arrangements were made for diagnostic catheterization however patient was found to be profoundly anemic with a hemoglobin of 7.9 on September 13.  Therefore catheterization was canceled and she was referred to primary care.  Additional laboratory studies have been performed.  Workup is ongoing.  At this point, she has not required transfusion.  She continues to have significant dyspnea on exertion with minimal activity.  She denies chest pain, palpitations, dizziness, syncope, or early satiety.  She does have mild left lower extremity swelling ever since having a toe amputated.  Overall volume status is much improved she has been compliant with her Lasix.  She had been off of it for some time prior to her last visit.  Weight is down significantly.  Home Medications    Prior to Admission  medications   Medication Sig Start Date End Date Taking? Authorizing Provider  amiodarone (PACERONE) 200 MG tablet Take 0.5 tablets (100 mg total) by mouth daily. 01/03/17  Yes Plonk, Gwyndolyn Saxon, MD  atorvastatin (LIPITOR) 10 MG tablet TAKE ONE TABLET BY MOUTH ONCE DAILY Patient taking differently: TAKE ONE TABLET BY MOUTH ONCE DAILY AT NOON 10/23/16  Yes Wellington Hampshire, MD  Ferrous Sulfate (IRON) 325 (65 Fe) MG TABS Take 1 tablet (325  mg total) by mouth daily. Patient taking differently: Take 5 tablets by mouth daily.  12/29/16  Yes Plonk, Gwyndolyn Saxon, MD  furosemide (LASIX) 40 MG tablet Take 1 tablet (40 mg total) by mouth daily. Patient taking differently: Take 40 mg by mouth daily at 12 noon.  12/21/16  Yes Wellington Hampshire, MD  glipiZIDE (GLUCOTROL) 10 MG tablet Take 10 mg by mouth 2 (two) times daily. 08/13/15  Yes [provider]  lisinopril (PRINIVIL,ZESTRIL) 20 MG tablet Take 1 tablet (20 mg total) by mouth daily. 12/29/16 03/29/17 Yes Plonk, Gwyndolyn Saxon, MD  metFORMIN (GLUCOPHAGE) 1000 MG tablet 1,000 mg. Takes 1 tablet am and 1/2 tablet pm daily.   Yes [provider]  metoprolol succinate (TOPROL-XL) 100 MG 24 hr tablet Take 1 tablet (100 mg total) by mouth daily. Take with or immediately following a meal. Patient taking differently: Take 100 mg by mouth daily at 12 noon. Take with or immediately following a meal. 08/17/16  Yes Wellington Hampshire, MD  spironolactone (ALDACTONE) 25 MG tablet Take 1 tablet (25 mg total) by mouth daily. Patient taking differently: Take 25 mg by mouth daily at 12 noon.  09/18/16 12/22/17 Yes Wellington Hampshire, MD  warfarin (COUMADIN) 5 MG tablet TAKE 1 TABLET BY MOUTH ONCE DAILY OR AS DIRECT BY COUMADIN CLINIC Patient taking differently: TAKE 1 TABLET BY MOUTH ONCE DAILY AT NOON 11/24/16  Yes Wellington Hampshire, MD    Review of Systems    Notable for dyspnea on exertion and mild left lower extremity swelling.  She denies chest pain, palpitations, PND, orthopnea, dizziness, syncope, or early satiety.  All other systems reviewed and are otherwise negative except as noted above.  Physical Exam    VS:  BP 124/70 (BP Location: Right Arm, Patient Position: Sitting, Cuff Size: Large)   Pulse 60   Ht 5\' 7"  (1.702 m)   Wt 233 lb 4 oz (105.8 kg)   BMI 36.53 kg/m  , BMI Body mass index is 36.53 kg/m. GEN: Well nourished, well developed, in no acute distress.  HEENT: normal.  Neck:  Supple, no JVD, carotid bruits, or masses. Cardiac: RRR, no murmurs, rubs, or gallops. No clubbing, cyanosis, trace to 1+ left lower extremity edema.  Radials/DP/PT 2+ and equal bilaterally.  Respiratory:  Respirations regular and unlabored, bibasilar crackles otherwise clear to auscultation. GI: Soft, nontender, nondistended, BS + x 4. MS: no deformity or atrophy. Skin: warm and dry, no rash. Neuro:  Strength and sensation are intact. Psych: Normal affect.  Accessory Clinical Findings    ECG -sinus rhythm, 60, left axis deviation, prior anterior/lateral and inferior infarcts.  No acute changes.  Assessment & Plan    1.  Cardiomyopathy/HFrEF: Patient with persistent LV dysfunction despite restoration of sinus rhythm.  Plan was for diagnostic catheterization however she was found to be anemic and thus those plans are on hold.  She has since followed up with primary care and hemoglobin remained low at 7.4 on September 19.  She does not have any follow-up planned  with primary care.  I will follow-up a CBC and basic metabolic panel today.  She will need primary care follow-up to further evaluate her anemia as we cannot perform catheterization with her hemoglobin and hematocrit where it was that in September.  From a volume standpoint, she is much improved compared to her last visit and weight is down significantly. She remains on beta-blocker, ACE inhibitor, Lasix, and spironolactone therapy.  2.  Paroxysmal atrial fibrillation: In sinus today.  She remains on beta-blocker, amiodarone, and warfarin.  We will have a low threshold to hold warfarin if necessary related to anemia.    3.  Essential hypertension: Stable.  4.  Hyperlipidemia: Continue statin therapy.  5.  Type 2 diabetes mellitus: Followed by primary care.  6.  Disposition: Follow-up labs today.  As above, she will need primary care follow-up regarding her anemia.  I will tentatively get her scheduled to see Dr. Velva Harman in about 1-2  months.   Murray Hodgkins, NP 01/30/2017, 6:58 PM

## 2017-01-30 NOTE — Patient Instructions (Signed)
Medication Instructions:  Please continue your current medications3  Labwork: CBC, BMET  Testing/Procedures: None  Follow-Up: 2 months w/ Dr. Fletcher Anon  If you need a refill on your cardiac medications before your next appointment, please call your pharmacy.

## 2017-01-31 ENCOUNTER — Other Ambulatory Visit: Payer: Self-pay | Admitting: Cardiovascular Disease

## 2017-01-31 LAB — CBC WITH DIFFERENTIAL/PLATELET
BASOS: 1 %
Basophils Absolute: 0.1 10*3/uL (ref 0.0–0.2)
EOS (ABSOLUTE): 0.4 10*3/uL (ref 0.0–0.4)
EOS: 6 %
HEMATOCRIT: 33.1 % — AB (ref 34.0–46.6)
HEMOGLOBIN: 9.8 g/dL — AB (ref 11.1–15.9)
Immature Grans (Abs): 0 10*3/uL (ref 0.0–0.1)
Immature Granulocytes: 0 %
Lymphocytes Absolute: 0.9 10*3/uL (ref 0.7–3.1)
Lymphs: 16 %
MCH: 23.8 pg — ABNORMAL LOW (ref 26.6–33.0)
MCHC: 29.6 g/dL — AB (ref 31.5–35.7)
MCV: 80 fL (ref 79–97)
MONOS ABS: 0.3 10*3/uL (ref 0.1–0.9)
Monocytes: 5 %
Neutrophils Absolute: 4.1 10*3/uL (ref 1.4–7.0)
Neutrophils: 72 %
Platelets: 272 10*3/uL (ref 150–379)
RBC: 4.12 x10E6/uL (ref 3.77–5.28)
WBC: 5.8 10*3/uL (ref 3.4–10.8)

## 2017-01-31 LAB — BASIC METABOLIC PANEL
BUN/Creatinine Ratio: 11 — ABNORMAL LOW (ref 12–28)
BUN: 12 mg/dL (ref 8–27)
CALCIUM: 8.9 mg/dL (ref 8.7–10.3)
CO2: 28 mmol/L (ref 20–29)
CREATININE: 1.12 mg/dL — AB (ref 0.57–1.00)
Chloride: 97 mmol/L (ref 96–106)
GFR calc Af Amer: 58 mL/min/{1.73_m2} — ABNORMAL LOW (ref >59)
GFR, EST NON AFRICAN AMERICAN: 51 mL/min/{1.73_m2} — AB (ref >59)
Glucose: 93 mg/dL (ref 65–99)
Potassium: 4 mmol/L (ref 3.5–5.2)
SODIUM: 142 mmol/L (ref 134–144)

## 2017-02-14 ENCOUNTER — Ambulatory Visit (INDEPENDENT_AMBULATORY_CARE_PROVIDER_SITE_OTHER): Payer: Medicare Other

## 2017-02-14 DIAGNOSIS — I4891 Unspecified atrial fibrillation: Secondary | ICD-10-CM | POA: Diagnosis not present

## 2017-02-14 DIAGNOSIS — Z5181 Encounter for therapeutic drug level monitoring: Secondary | ICD-10-CM

## 2017-02-14 LAB — POCT INR: INR: 2

## 2017-02-20 DIAGNOSIS — B351 Tinea unguium: Secondary | ICD-10-CM | POA: Diagnosis not present

## 2017-02-20 DIAGNOSIS — E1142 Type 2 diabetes mellitus with diabetic polyneuropathy: Secondary | ICD-10-CM | POA: Diagnosis not present

## 2017-02-27 ENCOUNTER — Encounter: Payer: Self-pay | Admitting: Cardiovascular Disease

## 2017-02-27 ENCOUNTER — Ambulatory Visit (INDEPENDENT_AMBULATORY_CARE_PROVIDER_SITE_OTHER): Payer: Medicare Other | Admitting: Cardiovascular Disease

## 2017-02-27 VITALS — BP 112/64 | HR 57 | Ht 67.0 in | Wt 219.5 lb

## 2017-02-27 DIAGNOSIS — I5022 Chronic systolic (congestive) heart failure: Secondary | ICD-10-CM | POA: Diagnosis not present

## 2017-02-27 DIAGNOSIS — I481 Persistent atrial fibrillation: Secondary | ICD-10-CM

## 2017-02-27 DIAGNOSIS — I209 Angina pectoris, unspecified: Secondary | ICD-10-CM | POA: Diagnosis not present

## 2017-02-27 DIAGNOSIS — I25119 Atherosclerotic heart disease of native coronary artery with unspecified angina pectoris: Secondary | ICD-10-CM | POA: Diagnosis not present

## 2017-02-27 DIAGNOSIS — I25118 Atherosclerotic heart disease of native coronary artery with other forms of angina pectoris: Secondary | ICD-10-CM

## 2017-02-27 DIAGNOSIS — Z01812 Encounter for preprocedural laboratory examination: Secondary | ICD-10-CM

## 2017-02-27 DIAGNOSIS — E785 Hyperlipidemia, unspecified: Secondary | ICD-10-CM

## 2017-02-27 DIAGNOSIS — I4819 Other persistent atrial fibrillation: Secondary | ICD-10-CM

## 2017-02-27 DIAGNOSIS — I10 Essential (primary) hypertension: Secondary | ICD-10-CM | POA: Diagnosis not present

## 2017-02-27 NOTE — Progress Notes (Signed)
Cardiology Office Note   Date:  02/27/2017   ID:  Meghan [name], DOB [birth year], MRN [id number]  PCP:  Adline Potter, MD  Cardiologist:   Kathlyn Sacramento, MD  Endocrinologist: Dr. Atha Starks  Chief Complaint  Patient presents with  . other    2 month follow up. Meds reviewed by the pt. verbally. "doing well."       History of Present Illness: Meghan [name] is a 66 y.o. female who presents for a followup visit regarding chronic systolic heart failure, coronary artery disease and persistent atrial fibrillation. Other medical problems include hypertension, 2 diabetes, hyperlipidemia and carotid artery disease status post left carotid endarterectomy with previous CVA. She never had cardiac cath in the past. She had worsening heart failure in the setting of atrial fibrillation with rapid ventricular response.  She was treated with amiodarone with restoration of sinus rhythm.  She had a repeat echocardiogram in August  which showed severely reduced LV systolic function with an EF of 20-25%, mild mitral regurgitation, moderate to severe tricuspid regurgitation and moderate pulmonary hypertension with systolic pulmonary pressure around 50 mmHg. Right and left cardiac catheterization was scheduled to evaluate her cardiomyopathy and systolic heart failure.  However, precath labs showed significant anemia with a hemoglobin of 7.9 and thus the procedure was canceled.  The patient was seen by her primary care physician and was started on iron with subsequent improvement in anemia with most recent hemoglobin of 9.7. The patient's volume overload continued to improve gradually with furosemide and she feels much better at the present time.  She has no leg edema.  Shortness of breath has improved significantly.  No chest pain.   Past Medical History:  Diagnosis Date  . Cardiomyopathy ([place])    a. 06/2013 Echo: EF 35-40%, glob HK, mildly dil LA, mild LVH, mild TR, mild to mod MR; b. 07/2016 Echo: EF  25-30%, sev mid-apicalanteroseptal, ant, apical HK, mild MR, sev dil LA, mod dil RA, mod TR, PASP 27mmHg; c. 11/2016 Echo: EF 20-25%, antsept DK, Gr2 DD, mild MR, mod dil LA/RV/RA, mod red RV fxn, mod-sev TR, PASP 40-6mmHg.  . Carotid arterial disease ([place])    a. 02/2012 s/p L CEA.  . Coronary artery disease    a. Presumed - 02/2012 Lexi MV: Anterior, anteroseptal, septal, apical infarct with mild peri-infarct ischemia, EF 41%- medically managed.  . 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 Hyperlipidemia   . Hypertension   . Morbid obesity ([place])   . Persistent atrial fibrillation (HCC)    a. CHA2DS2VASc = 7-->coumadin; maintaining sinus on amio.  . Stroke [facility]) 2013    Past Surgical History:  Procedure Laterality Date  . AMPUTATION TOE Left 09/22/2016   Procedure: AMPUTATION TOE;  Surgeon: Albertine Patricia, DPM;  Location: [facility];  Service: Podiatry;  Laterality: Left;  . CARDIOVERSION N/A 07/28/2016   Procedure: CARDIOVERSION;  Surgeon: Wellington Hampshire, MD;  Location: [facility];  Service: Cardiovascular;  Laterality: N/A;  . CARDIOVERSION N/A 08/07/2016   Procedure: CARDIOVERSION;  Surgeon: Wellington Hampshire, MD;  Location: [facility];  Service: Cardiovascular;  Laterality: N/A;  . CAROTID ENDARTERECTOMY     [facility]; Dr. Lucky Cowboy  . CHOLECYSTECTOMY    . LEG SURGERY     right;infection  . TONSILLECTOMY       Current Outpatient Medications  Medication Sig Dispense Refill  . amiodarone (PACERONE) 200 MG tablet Take 0.5 tablets (100 mg total) by mouth daily. 15 tablet  2  . atorvastatin (LIPITOR) 10 MG tablet TAKE ONE TABLET BY MOUTH ONCE DAILY (Patient taking differently: TAKE ONE TABLET BY MOUTH ONCE DAILY AT NOON) 90 tablet 3  . Ferrous Sulfate (IRON) 325 (65 Fe) MG TABS Take 1 tablet (325 mg total) by mouth daily. (Patient taking differently: Take 5 tablets by mouth daily. ) 30 each 0  . furosemide (LASIX) 40 MG tablet Take 1 tablet (40 mg total) by mouth daily. (Patient  taking differently: Take 40 mg by mouth daily at 12 noon. ) 90 tablet 3  . glipiZIDE (GLUCOTROL) 10 MG tablet Take 10 mg by mouth 2 (two) times daily.    Marland Kitchen lisinopril (PRINIVIL,ZESTRIL) 20 MG tablet Take 1 tablet (20 mg total) by mouth daily. 30 tablet 2  . metFORMIN (GLUCOPHAGE) 1000 MG tablet Takes 1 tablet am and 1/2 tablet pm daily.     . metoprolol succinate (TOPROL-XL) 100 MG 24 hr tablet Take 1 tablet (100 mg total) by mouth daily. Take with or immediately following a meal. (Patient taking differently: Take 100 mg by mouth daily at 12 noon. Take with or immediately following a meal.) 90 tablet 2  . spironolactone (ALDACTONE) 25 MG tablet TAKE 1 TABLET BY MOUTH ONCE DAILY 30 tablet 6  . warfarin (COUMADIN) 5 MG tablet TAKE 1 TABLET BY MOUTH ONCE DAILY OR AS DIRECT BY COUMADIN CLINIC (Patient taking differently: TAKE 1 TABLET BY MOUTH ONCE DAILY AT NOON) 30 tablet 3   No current facility-administered medications for this visit.     Allergies:   Patient has no known allergies.    Social History:  The patient  reports that she has quit smoking. Her smoking use included cigarettes. She has a 2.50 pack-year smoking history. she has never used smokeless tobacco. She reports that she does not drink alcohol or use drugs.   Family History:  The patient's family history includes Heart disease in her mother.    ROS:  Please see the history of present illness.   Otherwise, review of systems are positive for none.   All other systems are reviewed and negative.    PHYSICAL EXAM: VS:  BP 112/64 (BP Location: Left Arm, Patient Position: Sitting, Cuff Size: Normal)   Pulse (!) 57   Ht 5\' 7"  (1.702 m)   Wt 219 lb 8 oz (99.6 kg)   BMI 34.38 kg/m  , BMI Body mass index is 34.38 kg/m. GEN: Well nourished, well developed, in no acute distress  HEENT: normal  Neck: No JVD, carotid bruits, or masses Cardiac:RRR; no murmurs, rubs, or gallops, trace bilateral leg edema  Respiratory: Few bibasilar  crackles, normal work of breathing GI: soft, nontender, mild abdominal distention, + BS MS: no deformity or atrophy  Skin: warm and dry, no rash Neuro:  Strength and sensation are intact Psych: euthymic mood, full affect   EKG:  EKG is ordered today. The ekg ordered today demonstrates sinus bradycardia with minimal LVH.  Recent Labs: 07/26/2016: B Natriuretic Peptide 1,062.0 09/21/2016: Magnesium 1.3 12/27/2016: ALT 27; TSH 5.780 01/30/2017: BUN 12; Creatinine, Ser 1.12; Hemoglobin 9.8; Platelets 272; Potassium 4.0; Sodium 142    Lipid Panel    Component Value Date/Time   CHOL 67 (L) 12/27/2016 0826   CHOL 165 02/14/2012 0410   TRIG 45 12/27/2016 0826   TRIG 168 02/14/2012 0410   HDL 23 (L) 12/27/2016 0826   HDL 31 (L) 02/14/2012 0410   CHOLHDL 2.9 12/27/2016 0826   VLDL 34 02/14/2012 0410  LDLCALC 35 12/27/2016 0826   LDLCALC 100 02/14/2012 0410      Wt Readings from Last 3 Encounters:  02/27/17 219 lb 8 oz (99.6 kg)  01/30/17 233 lb 4 oz (105.8 kg)  12/25/16 256 lb 11.2 oz (116.4 kg)       ASSESSMENT AND PLAN:  1. chronic systolic heart failure: She appears to be euvolemic on current dose of furosemide 40 mg once daily.  Her weight decreased from 256 pounds to 219 pounds since September. The patient is suspected to have underlying ischemic cardiomyopathy.  Given improvement in anemia, I am going to reschedule her right and left cardiac catheterization.  I again discussed the procedure with him in details with her as well as risks and benefits.  2. Persistent atrial fibrillation:  She is maintaining in sinus rhythm with amiodarone and Toprol. She is on anticoagulation with warfarin. Hold warfarin 5 days before cardiac catheterization.  3. Essential hypertension: Blood pressure is controlled on current medications  4. Coronary artery disease involving native coronary arteries with other forms of angina: I suspect the patient will likely have three-vessel coronary  artery disease.  5. Hyperlipidemia: Continue treatment with atorvastatin.  Most recent LDL was 35 which is at target.   Disposition:   FU with me in 1 months  Signed,  Kathlyn Sacramento, MD  02/27/2017 4:23 PM    [place]

## 2017-02-27 NOTE — H&P (View-Only) (Signed)
Cardiology Office Note   Date:  02/27/2017   ID:  Meghan [name], DOB Mar 30, 1951, MRN [id number]  PCP:  Adline Potter, MD  Cardiologist:   Kathlyn Sacramento, MD  Endocrinologist: Dr. Atha Starks  Chief Complaint  Patient presents with  . other    2 month follow up. Meds reviewed by the pt. verbally. "doing well."       History of Present Illness: Meghan [name] is a 66 y.o. female who presents for a followup visit regarding chronic systolic heart failure, coronary artery disease and persistent atrial fibrillation. Other medical problems include hypertension, 2 diabetes, hyperlipidemia and carotid artery disease status post left carotid endarterectomy with previous CVA. She never had cardiac cath in the past. She had worsening heart failure in the setting of atrial fibrillation with rapid ventricular response.  She was treated with amiodarone with restoration of sinus rhythm.  She had a repeat echocardiogram in August  which showed severely reduced LV systolic function with an EF of 20-25%, mild mitral regurgitation, moderate to severe tricuspid regurgitation and moderate pulmonary hypertension with systolic pulmonary pressure around 50 mmHg. Right and left cardiac catheterization was scheduled to evaluate her cardiomyopathy and systolic heart failure.  However, precath labs showed significant anemia with a hemoglobin of 7.9 and thus the procedure was canceled.  The patient was seen by her primary care physician and was started on iron with subsequent improvement in anemia with most recent hemoglobin of 9.7. The patient's volume overload continued to improve gradually with furosemide and she feels much better at the present time.  She has no leg edema.  Shortness of breath has improved significantly.  No chest pain.   Past Medical History:  Diagnosis Date  . Cardiomyopathy ([place])    a. 06/2013 Echo: EF 35-40%, glob HK, mildly dil LA, mild LVH, mild TR, mild to mod MR; b. 07/2016 Echo: EF  25-30%, sev mid-apicalanteroseptal, ant, apical HK, mild MR, sev dil LA, mod dil RA, mod TR, PASP 28mmHg; c. 11/2016 Echo: EF 20-25%, antsept DK, Gr2 DD, mild MR, mod dil LA/RV/RA, mod red RV fxn, mod-sev TR, PASP 40-52mmHg.  . Carotid arterial disease ([place])    a. 02/2012 s/p L CEA.  . Coronary artery disease    a. Presumed - 02/2012 Lexi MV: Anterior, anteroseptal, septal, apical infarct with mild peri-infarct ischemia, EF 41%- medically managed.  . 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 Hyperlipidemia   . Hypertension   . Morbid obesity ([place])   . Persistent atrial fibrillation (HCC)    a. CHA2DS2VASc = 7-->coumadin; maintaining sinus on amio.  . Stroke [facility]) 2013    Past Surgical History:  Procedure Laterality Date  . AMPUTATION TOE Left 09/22/2016   Procedure: AMPUTATION TOE;  Surgeon: Albertine Patricia, DPM;  Location: [facility] ORS;  Service: Podiatry;  Laterality: Left;  . CARDIOVERSION N/A 07/28/2016   Procedure: CARDIOVERSION;  Surgeon: Wellington Hampshire, MD;  Location: [facility] ORS;  Service: Cardiovascular;  Laterality: N/A;  . CARDIOVERSION N/A 08/07/2016   Procedure: CARDIOVERSION;  Surgeon: Wellington Hampshire, MD;  Location: [facility] ORS;  Service: Cardiovascular;  Laterality: N/A;  . CAROTID ENDARTERECTOMY     [facility]; Dr. Lucky Cowboy  . CHOLECYSTECTOMY    . LEG SURGERY     right;infection  . TONSILLECTOMY       Current Outpatient Medications  Medication Sig Dispense Refill  . amiodarone (PACERONE) 200 MG tablet Take 0.5 tablets (100 mg total) by mouth daily. 15 tablet  2  . atorvastatin (LIPITOR) 10 MG tablet TAKE ONE TABLET BY MOUTH ONCE DAILY (Patient taking differently: TAKE ONE TABLET BY MOUTH ONCE DAILY AT NOON) 90 tablet 3  . Ferrous Sulfate (IRON) 325 (65 Fe) MG TABS Take 1 tablet (325 mg total) by mouth daily. (Patient taking differently: Take 5 tablets by mouth daily. ) 30 each 0  . furosemide (LASIX) 40 MG tablet Take 1 tablet (40 mg total) by mouth daily. (Patient  taking differently: Take 40 mg by mouth daily at 12 noon. ) 90 tablet 3  . glipiZIDE (GLUCOTROL) 10 MG tablet Take 10 mg by mouth 2 (two) times daily.    Marland Kitchen lisinopril (PRINIVIL,ZESTRIL) 20 MG tablet Take 1 tablet (20 mg total) by mouth daily. 30 tablet 2  . metFORMIN (GLUCOPHAGE) 1000 MG tablet Takes 1 tablet am and 1/2 tablet pm daily.     . metoprolol succinate (TOPROL-XL) 100 MG 24 hr tablet Take 1 tablet (100 mg total) by mouth daily. Take with or immediately following a meal. (Patient taking differently: Take 100 mg by mouth daily at 12 noon. Take with or immediately following a meal.) 90 tablet 2  . spironolactone (ALDACTONE) 25 MG tablet TAKE 1 TABLET BY MOUTH ONCE DAILY 30 tablet 6  . warfarin (COUMADIN) 5 MG tablet TAKE 1 TABLET BY MOUTH ONCE DAILY OR AS DIRECT BY COUMADIN CLINIC (Patient taking differently: TAKE 1 TABLET BY MOUTH ONCE DAILY AT NOON) 30 tablet 3   No current facility-administered medications for this visit.     Allergies:   Patient has no known allergies.    Social History:  The patient  reports that she has quit smoking. Her smoking use included cigarettes. She has a 2.50 pack-year smoking history. she has never used smokeless tobacco. She reports that she does not drink alcohol or use drugs.   Family History:  The patient's family history includes Heart disease in her mother.    ROS:  Please see the history of present illness.   Otherwise, review of systems are positive for none.   All other systems are reviewed and negative.    PHYSICAL EXAM: VS:  BP 112/64 (BP Location: Left Arm, Patient Position: Sitting, Cuff Size: Normal)   Pulse (!) 57   Ht 5\' 7"  (1.702 m)   Wt 219 lb 8 oz (99.6 kg)   BMI 34.38 kg/m  , BMI Body mass index is 34.38 kg/m. GEN: Well nourished, well developed, in no acute distress  HEENT: normal  Neck: No JVD, carotid bruits, or masses Cardiac:RRR; no murmurs, rubs, or gallops, trace bilateral leg edema  Respiratory: Few bibasilar  crackles, normal work of breathing GI: soft, nontender, mild abdominal distention, + BS MS: no deformity or atrophy  Skin: warm and dry, no rash Neuro:  Strength and sensation are intact Psych: euthymic mood, full affect   EKG:  EKG is ordered today. The ekg ordered today demonstrates sinus bradycardia with minimal LVH.  Recent Labs: 07/26/2016: B Natriuretic Peptide 1,062.0 09/21/2016: Magnesium 1.3 12/27/2016: ALT 27; TSH 5.780 01/30/2017: BUN 12; Creatinine, Ser 1.12; Hemoglobin 9.8; Platelets 272; Potassium 4.0; Sodium 142    Lipid Panel    Component Value Date/Time   CHOL 67 (L) 12/27/2016 0826   CHOL 165 02/14/2012 0410   TRIG 45 12/27/2016 0826   TRIG 168 02/14/2012 0410   HDL 23 (L) 12/27/2016 0826   HDL 31 (L) 02/14/2012 0410   CHOLHDL 2.9 12/27/2016 0826   VLDL 34 02/14/2012 0410  LDLCALC 35 12/27/2016 0826   LDLCALC 100 02/14/2012 0410      Wt Readings from Last 3 Encounters:  02/27/17 219 lb 8 oz (99.6 kg)  01/30/17 233 lb 4 oz (105.8 kg)  12/25/16 256 lb 11.2 oz (116.4 kg)       ASSESSMENT AND PLAN:  1. chronic systolic heart failure: She appears to be euvolemic on current dose of furosemide 40 mg once daily.  Her weight decreased from 256 pounds to 219 pounds since September. The patient is suspected to have underlying ischemic cardiomyopathy.  Given improvement in anemia, I am going to reschedule her right and left cardiac catheterization.  I again discussed the procedure with him in details with her as well as risks and benefits.  2. Persistent atrial fibrillation:  She is maintaining in sinus rhythm with amiodarone and Toprol. She is on anticoagulation with warfarin. Hold warfarin 5 days before cardiac catheterization.  3. Essential hypertension: Blood pressure is controlled on current medications  4. Coronary artery disease involving native coronary arteries with other forms of angina: I suspect the patient will likely have three-vessel coronary  artery disease.  5. Hyperlipidemia: Continue treatment with atorvastatin.  Most recent LDL was 35 which is at target.   Disposition:   FU with me in 1 months  Signed,  Kathlyn Sacramento, MD  02/27/2017 4:23 PM    [place]

## 2017-02-27 NOTE — Patient Instructions (Addendum)
Medication Instructions:  Your physician recommends that you continue on your current medications as directed. Please refer to the Current Medication list given to you today.   Labwork: BMET, CBC, PT/INR by November 29 at the [facility] lab. No appt necessary  Testing/Procedures: Your physician has requested that you have a cardiac catheterization. Cardiac catheterization is used to diagnose and/or treat various heart conditions. Doctors may recommend this procedure for a number of different reasons. The most common reason is to evaluate chest pain. Chest pain can be a symptom of coronary artery disease (CAD), and cardiac catheterization can show whether plaque is narrowing or blocking your heart's arteries. This procedure is also used to evaluate the valves, as well as measure the blood flow and oxygen levels in different parts of your heart. For further information please visit [employer]. Please follow instruction sheet, as given.  [facility] Cardiac Cath Instructions   You are scheduled for a Cardiac Cath on: Monday, December 3  Please arrive at  8:30am on the day of your procedure  Please expect a call from our [place] to pre-register you  Do not eat/drink anything after midnight  Someone will need to drive you home  It is recommended someone be with you for the first 24 hours after your procedure  Wear clothes that are easy to get on/off and wear slip on shoes if possible   Medications bring a current list of all medications with you   __xx_ Do not take coumadin 5 days prior to your cath. _XX_ Do not take lasix the morning of your cath.   Day of your procedure: Arrive at the [facility] entrance.  Free valet service is available.  After entering the [facility] please check-in at the registration desk (1st desk on your right) to receive your armband. After receiving your armband someone will escort you to the cardiac cath/special procedures  waiting area.  The usual length of stay after your procedure is about 2 to 3 hours.  This can vary.  If you have any questions, please call our office at [phone], or you may call the cardiac cath lab at [facility] directly at [phone]   Follow-Up: Your physician recommends that you schedule a follow-up appointment with Dr. Fletcher Anon 3 weeks after December 3 catheterization   Any Other Special Instructions Will Be Listed Below (If Applicable).     If you need a refill on your cardiac medications before your next appointment, please call your pharmacy.   Angiogram An angiogram, also called angiography, is a procedure used to look at the blood vessels. In this procedure, dye is injected through a long, thin tube (catheter) into an artery. X-rays are then taken. The X-rays will show if there is a blockage or problem in a blood vessel. Tell a health care provider about:  Any allergies you have, including allergies to shellfish or contrast dye.  All medicines you are taking, including vitamins, herbs, eye drops, creams, and over-the-counter medicines.  Any problems you or family members have had with anesthetic medicines.  Any blood disorders you have.  Any surgeries you have had.  Any previous kidney problems or failure you have had.  Any medical conditions you have.  Possibility of pregnancy, if this applies. What are the risks? Generally, an angiogram is a safe procedure. However, as with any procedure, problems can occur. Possible problems include:  Injury to the blood vessels, including rupture or bleeding.  Infection or bruising at the catheter site.  Allergic reaction to the dye or contrast used.  Kidney damage from the dye or contrast used.  Blood clots that can lead to a stroke or heart attack.  What happens before the procedure?  Do not eat or drink after midnight on the night before the procedure, or as directed by your health care provider.  Ask your health  care provider if you may drink enough water to take any needed medicines the morning of the procedure. What happens during the procedure?  You may be given a medicine to help you relax (sedative) before and during the procedure. This medicine is given through an IV access tube that is inserted into one of your veins.  The area where the catheter will be inserted will be washed and shaved. This is usually done in the groin but may be done in the fold of your arm (near your elbow) or in the wrist.  A medicine will be given to numb the area where the catheter will be inserted (local anesthetic).  The catheter will be inserted with a guide wire into an artery. The catheter is guided by using a type of X-ray (fluoroscopy) to the blood vessel being examined.  Dye is then injected into the catheter, and X-rays are taken. The dye helps to show where any narrowing or blockages are located. What happens after the procedure?  If the procedure is done through the leg, you will be kept in bed lying flat for several hours. You will be instructed to not bend or cross your legs.  The insertion site will be checked frequently.  The pulse in your feet or wrist will be checked frequently.  Additional blood tests, X-rays, and electrocardiography may be done.  You may need to stay in the hospital overnight for observation. This information is not intended to replace advice given to you by your health care provider. Make sure you discuss any questions you have with your health care provider. Document Released: 01/04/2005 Document Revised: 09/08/2015 Document Reviewed: 08/28/2012 Elsevier Interactive Patient Education  2017 [employer] An angiogram is an X-ray test. It is used to look at your blood vessels. For this test, a dye is put into the blood vessel being checked. The dye shows up on X-rays. It helps your doctor see if there is a blockage or other problem in the blood vessel. What happens  before the procedure?  Follow your doctor's instructions about limiting what you eat or drink.  Ask your doctor if you may drink enough water to take any needed medicines the morning of the test.  Plan to have someone take you home after the test.  If you go home the same day as the test, plan to have someone stay with you for 24 hours. What happens during the procedure?  An IV tube will be put into one of your veins.  You will be given a medicine that makes you relax (sedative).  Your skin will be washed where the thin tube (catheter) will be put in. Hair may be removed from this area. The tube may be put into: ? Your upper leg area (groin). ? The fold of your arm, near your elbow. ? Your wrist.  You will be given a medicine that numbs the area where the tube will be inserted (local anesthetic).  The tube will be inserted into a blood vessel.  Using a type of X-ray (fluoroscopy) to see, your doctor will move the tube into the blood vessel to  check it.  Dye will be put in through the tube. X-rays of your blood vessels will then be taken. Different doctors and hospitals may do this procedure differently. What happens after the procedure?  If the test is done through the leg, you will be kept in bed lying flat for several hours. You will be told to not bend or cross your legs.  The area where the tube was inserted will be checked often.  The pulse in your feet or wrist will be checked often.  More tests or X-rays may be done. This information is not intended to replace advice given to you by your health care provider. Make sure you discuss any questions you have with your health care provider. Document Released: 06/23/2008 Document Revised: 09/02/2015 Document Reviewed: 08/28/2012 Elsevier Interactive Patient Education  2017 [employer].

## 2017-03-06 ENCOUNTER — Other Ambulatory Visit
Admission: RE | Admit: 2017-03-06 | Discharge: 2017-03-06 | Disposition: A | Discharge status: Home / Self Care | Payer: Medicare Other | Source: Ambulatory Visit | Attending: Cardiovascular Disease | Admitting: Cardiovascular Disease

## 2017-03-06 DIAGNOSIS — Z01812 Encounter for preprocedural laboratory examination: Secondary | ICD-10-CM | POA: Diagnosis not present

## 2017-03-06 LAB — BASIC METABOLIC PANEL
ANION GAP: 9 (ref 5–15)
BUN: 49 mg/dL — ABNORMAL HIGH (ref 6–20)
CALCIUM: 8.9 mg/dL (ref 8.9–10.3)
CHLORIDE: 102 mmol/L (ref 101–111)
CO2: 24 mmol/L (ref 22–32)
Creatinine, Ser: 1.62 mg/dL — ABNORMAL HIGH (ref 0.44–1.00)
GFR calc non Af Amer: 32 mL/min — ABNORMAL LOW (ref >60)
GFR, EST AFRICAN AMERICAN: 37 mL/min — AB (ref >60)
GLUCOSE: 321 mg/dL — AB (ref 65–99)
POTASSIUM: 4.9 mmol/L (ref 3.5–5.1)
Sodium: 135 mmol/L (ref 135–145)

## 2017-03-06 LAB — CBC
HEMATOCRIT: 34.4 % — AB (ref 35.0–47.0)
HEMOGLOBIN: 11.1 g/dL — AB (ref 12.0–16.0)
MCH: 26.6 pg (ref 26.0–34.0)
MCHC: 32.2 g/dL (ref 32.0–36.0)
MCV: 82.6 fL (ref 80.0–100.0)
Platelets: 195 10*3/uL (ref 150–440)
RBC: 4.17 MIL/uL (ref 3.80–5.20)
RDW: 28 % — AB (ref 11.5–14.5)
WBC: 5 10*3/uL (ref 3.6–11.0)

## 2017-03-06 LAB — PROTIME-INR
INR: 1.9
PROTHROMBIN TIME: 21.6 s — AB (ref 11.4–15.2)

## 2017-03-07 ENCOUNTER — Other Ambulatory Visit: Payer: Self-pay

## 2017-03-07 ENCOUNTER — Other Ambulatory Visit: Payer: Self-pay | Admitting: Cardiovascular Disease

## 2017-03-07 MED ORDER — FUROSEMIDE 40 MG PO TABS
20.0000 mg | ORAL_TABLET | Freq: Every day | ORAL | 3 refills | Status: DC
Start: 2017-03-07 — End: 2017-11-23

## 2017-03-12 ENCOUNTER — Encounter: Payer: Self-pay | Admitting: *Deleted

## 2017-03-12 ENCOUNTER — Ambulatory Visit
Admission: RE | Admit: 2017-03-12 | Discharge: 2017-03-12 | Disposition: A | Discharge status: Home / Self Care | Payer: Medicare Other | Source: Ambulatory Visit | Attending: Cardiovascular Disease | Admitting: Cardiovascular Disease

## 2017-03-12 ENCOUNTER — Encounter
Admission: RE | Disposition: A | Discharge status: Home / Self Care | Payer: Self-pay | Source: Ambulatory Visit | Attending: Cardiovascular Disease

## 2017-03-12 DIAGNOSIS — Z87891 Personal history of nicotine dependence: Secondary | ICD-10-CM | POA: Insufficient documentation

## 2017-03-12 DIAGNOSIS — I272 Pulmonary hypertension, unspecified: Secondary | ICD-10-CM | POA: Insufficient documentation

## 2017-03-12 DIAGNOSIS — R0602 Shortness of breath: Secondary | ICD-10-CM

## 2017-03-12 DIAGNOSIS — N189 Chronic kidney disease, unspecified: Secondary | ICD-10-CM | POA: Diagnosis not present

## 2017-03-12 DIAGNOSIS — I255 Ischemic cardiomyopathy: Secondary | ICD-10-CM | POA: Insufficient documentation

## 2017-03-12 DIAGNOSIS — I5022 Chronic systolic (congestive) heart failure: Secondary | ICD-10-CM | POA: Diagnosis not present

## 2017-03-12 DIAGNOSIS — Z89422 Acquired absence of other left toe(s): Secondary | ICD-10-CM | POA: Insufficient documentation

## 2017-03-12 DIAGNOSIS — E1122 Type 2 diabetes mellitus with diabetic chronic kidney disease: Secondary | ICD-10-CM | POA: Diagnosis not present

## 2017-03-12 DIAGNOSIS — E785 Hyperlipidemia, unspecified: Secondary | ICD-10-CM | POA: Insufficient documentation

## 2017-03-12 DIAGNOSIS — Z7984 Long term (current) use of oral hypoglycemic drugs: Secondary | ICD-10-CM | POA: Insufficient documentation

## 2017-03-12 DIAGNOSIS — Z7901 Long term (current) use of anticoagulants: Secondary | ICD-10-CM | POA: Diagnosis not present

## 2017-03-12 DIAGNOSIS — I481 Persistent atrial fibrillation: Secondary | ICD-10-CM | POA: Insufficient documentation

## 2017-03-12 DIAGNOSIS — I2510 Atherosclerotic heart disease of native coronary artery without angina pectoris: Secondary | ICD-10-CM | POA: Insufficient documentation

## 2017-03-12 DIAGNOSIS — Z8673 Personal history of transient ischemic attack (TIA), and cerebral infarction without residual deficits: Secondary | ICD-10-CM | POA: Insufficient documentation

## 2017-03-12 DIAGNOSIS — Z6834 Body mass index (BMI) 34.0-34.9, adult: Secondary | ICD-10-CM | POA: Insufficient documentation

## 2017-03-12 DIAGNOSIS — I130 Hypertensive heart and chronic kidney disease with heart failure and stage 1 through stage 4 chronic kidney disease, or unspecified chronic kidney disease: Secondary | ICD-10-CM | POA: Diagnosis not present

## 2017-03-12 HISTORY — PX: RIGHT/LEFT HEART CATH AND CORONARY ANGIOGRAPHY: CATH118266

## 2017-03-12 LAB — BASIC METABOLIC PANEL
Anion gap: 8 (ref 5–15)
BUN: 51 mg/dL — ABNORMAL HIGH (ref 6–20)
CALCIUM: 9.7 mg/dL (ref 8.9–10.3)
CHLORIDE: 107 mmol/L (ref 101–111)
CO2: 22 mmol/L (ref 22–32)
CREATININE: 1.6 mg/dL — AB (ref 0.44–1.00)
GFR calc Af Amer: 37 mL/min — ABNORMAL LOW (ref >60)
GFR calc non Af Amer: 32 mL/min — ABNORMAL LOW (ref >60)
GLUCOSE: 154 mg/dL — AB (ref 65–99)
Potassium: 5.2 mmol/L — ABNORMAL HIGH (ref 3.5–5.1)
Sodium: 137 mmol/L (ref 135–145)

## 2017-03-12 LAB — GLUCOSE, CAPILLARY: Glucose-Capillary: 144 mg/dL — ABNORMAL HIGH (ref 65–99)

## 2017-03-12 SURGERY — RIGHT/LEFT HEART CATH AND CORONARY ANGIOGRAPHY
Anesthesia: Moderate Sedation | Laterality: Bilateral

## 2017-03-12 MED ORDER — SODIUM CHLORIDE 0.9% FLUSH: 3.0000 mL | Freq: Two times a day (BID) | INTRAVENOUS | Status: DC

## 2017-03-12 MED ORDER — SODIUM CHLORIDE 0.9% FLUSH: 3.0000 mL | INTRAVENOUS | Status: DC | PRN

## 2017-03-12 MED ORDER — LIDOCAINE HCL (PF) 1 % IJ SOLN
INTRAMUSCULAR | Status: DC | PRN
  Administered 2017-03-12: 5 mL

## 2017-03-12 MED ORDER — HEPARIN SODIUM (PORCINE) 1000 UNIT/ML IJ SOLN
INTRAMUSCULAR | Status: DC | PRN
  Administered 2017-03-12: 4000 [IU] via INTRAVENOUS

## 2017-03-12 MED ORDER — LIDOCAINE HCL (PF) 1 % IJ SOLN
INTRAMUSCULAR | Status: AC
  Filled 2017-03-12: qty 30

## 2017-03-12 MED ORDER — SODIUM CHLORIDE 0.9 % IV SOLN: INTRAVENOUS | Status: DC

## 2017-03-12 MED ORDER — SODIUM CHLORIDE 0.9 % IV SOLN: 250.0000 mL | INTRAVENOUS | Status: DC | PRN

## 2017-03-12 MED ORDER — MIDAZOLAM HCL 2 MG/2ML IJ SOLN
INTRAMUSCULAR | Status: DC | PRN
  Administered 2017-03-12: 1 mg via INTRAVENOUS

## 2017-03-12 MED ORDER — HEPARIN SODIUM (PORCINE) 1000 UNIT/ML IJ SOLN
INTRAMUSCULAR | Status: AC
  Filled 2017-03-12: qty 1

## 2017-03-12 MED ORDER — IOPAMIDOL (ISOVUE-300) INJECTION 61%
INTRAVENOUS | Status: DC | PRN
  Administered 2017-03-12: 55 mL via INTRA_ARTERIAL

## 2017-03-12 MED ORDER — FENTANYL CITRATE (PF) 100 MCG/2ML IJ SOLN
INTRAMUSCULAR | Status: AC
  Filled 2017-03-12: qty 2

## 2017-03-12 MED ORDER — ASPIRIN 81 MG PO CHEW
CHEWABLE_TABLET | ORAL | Status: AC
  Administered 2017-03-12: 81 mg
  Filled 2017-03-12: qty 1

## 2017-03-12 MED ORDER — VERAPAMIL HCL 2.5 MG/ML IV SOLN
INTRAVENOUS | Status: AC
  Filled 2017-03-12: qty 2

## 2017-03-12 MED ORDER — SODIUM CHLORIDE 0.9 % IV SOLN
INTRAVENOUS | Status: DC
  Administered 2017-03-12: 09:00:00 via INTRAVENOUS

## 2017-03-12 MED ORDER — MIDAZOLAM HCL 2 MG/2ML IJ SOLN
INTRAMUSCULAR | Status: AC
Start: 2017-03-12 — End: 2017-03-12
  Filled 2017-03-12: qty 2

## 2017-03-12 MED ORDER — ASPIRIN 81 MG PO CHEW: 81.0000 mg | CHEWABLE_TABLET | ORAL | Status: DC

## 2017-03-12 MED ORDER — HEPARIN (PORCINE) IN NACL 2-0.9 UNIT/ML-% IJ SOLN
INTRAMUSCULAR | Status: AC
  Filled 2017-03-12: qty 500

## 2017-03-12 MED ORDER — FENTANYL CITRATE (PF) 100 MCG/2ML IJ SOLN
INTRAMUSCULAR | Status: DC | PRN
  Administered 2017-03-12: 25 ug via INTRAVENOUS

## 2017-03-12 SURGICAL SUPPLY — 13 items
CATH BALLN WEDGE 5F 110CM (CATHETERS) ×3 IMPLANT
CATH INFINITI 5 FR JL3.5 (CATHETERS) ×3 IMPLANT
CATH INFINITI JR4 5F (CATHETERS) ×3 IMPLANT
CATH LAUNCHER 6FR JR4 (CATHETERS) ×3 IMPLANT
CATH OPTITORQUE JACKY 4.0 5F (CATHETERS) ×3 IMPLANT
DEVICE RAD TR BAND REGULAR (VASCULAR PRODUCTS) ×3 IMPLANT
GLIDESHEATH SLEND SS 6F .021 (SHEATH) ×3 IMPLANT
KIT MANI 3VAL PERCEP (MISCELLANEOUS) ×3 IMPLANT
KIT RIGHT HEART (MISCELLANEOUS) ×3 IMPLANT
PACK CARDIAC CATH (CUSTOM PROCEDURE TRAY) ×3 IMPLANT
SHEATH GLIDE SLENDER 4/5FR (SHEATH) ×3 IMPLANT
VALVE COPILOT STAT (MISCELLANEOUS) ×3 IMPLANT
WIRE ROSEN-J .035X260CM (WIRE) ×3 IMPLANT

## 2017-03-12 NOTE — Discharge Instructions (Signed)
Do not resume Metformin. Discuss with your PCP alternatives to this medication given abnormal kidney function with heart failure.  Resume Warfarin tonight.  Our office will call you regarding labs to be done later this week

## 2017-03-12 NOTE — Interval H&P Note (Signed)
History and Physical Interval Note:  03/12/2017 11:19 AM  Meghan [name]  has presented today for surgery, with the diagnosis of RT and LT Cath   Shortness of breath  Low ejection fraction  The various methods of treatment have been discussed with the patient and family. After consideration of risks, benefits and other options for treatment, the patient has consented to  Procedure(s): RIGHT/LEFT HEART CATH AND CORONARY ANGIOGRAPHY (Bilateral) as a surgical intervention .  The patient's history has been reviewed, patient examined, no change in status, stable for surgery.  I have reviewed the patient's chart and labs.  Questions were answered to the patient's satisfaction.     Kathlyn Sacramento

## 2017-03-13 ENCOUNTER — Telehealth: Payer: Self-pay | Admitting: Cardiovascular Disease

## 2017-03-13 ENCOUNTER — Other Ambulatory Visit: Payer: Self-pay

## 2017-03-13 DIAGNOSIS — I130 Hypertensive heart and chronic kidney disease with heart failure and stage 1 through stage 4 chronic kidney disease, or unspecified chronic kidney disease: Secondary | ICD-10-CM

## 2017-03-13 NOTE — Telephone Encounter (Signed)
Wellington Hampshire, MD  Georgiana Shore, RN        Status post cardiac catheterization today. Check basic metabolic profile on Wednesday given contrast exposure and chronic kidney disease.    Scheduled pt f/u cardiac cath with Dr. Fletcher Anon, 12/11, 3:40am S/w pt who verbalized understanding of labs Wednesday at the [facility] and appt. She is agreeable w/plan.

## 2017-03-14 ENCOUNTER — Other Ambulatory Visit
Admission: RE | Admit: 2017-03-14 | Discharge: 2017-03-14 | Disposition: A | Discharge status: Home / Self Care | Payer: Medicare Other | Source: Ambulatory Visit | Attending: Cardiovascular Disease | Admitting: Cardiovascular Disease

## 2017-03-14 DIAGNOSIS — I130 Hypertensive heart and chronic kidney disease with heart failure and stage 1 through stage 4 chronic kidney disease, or unspecified chronic kidney disease: Secondary | ICD-10-CM

## 2017-03-14 LAB — BASIC METABOLIC PANEL
Anion gap: 9 (ref 5–15)
BUN: 50 mg/dL — ABNORMAL HIGH (ref 6–20)
CHLORIDE: 104 mmol/L (ref 101–111)
CO2: 21 mmol/L — AB (ref 22–32)
CREATININE: 1.45 mg/dL — AB (ref 0.44–1.00)
Calcium: 9 mg/dL (ref 8.9–10.3)
GFR calc non Af Amer: 36 mL/min — ABNORMAL LOW (ref >60)
GFR, EST AFRICAN AMERICAN: 42 mL/min — AB (ref >60)
GLUCOSE: 205 mg/dL — AB (ref 65–99)
Potassium: 5 mmol/L (ref 3.5–5.1)
Sodium: 134 mmol/L — ABNORMAL LOW (ref 135–145)

## 2017-03-16 ENCOUNTER — Other Ambulatory Visit: Payer: Self-pay | Admitting: Family Medicine

## 2017-03-16 MED ORDER — LISINOPRIL 20 MG PO TABS: 20.0000 mg | ORAL_TABLET | Freq: Every day | ORAL | 3 refills | Status: DC

## 2017-03-20 ENCOUNTER — Ambulatory Visit: Payer: Medicare Other | Admitting: Cardiovascular Disease

## 2017-03-21 ENCOUNTER — Ambulatory Visit: Payer: Medicare Other | Admitting: Nurse Practitioner

## 2017-03-28 ENCOUNTER — Encounter: Payer: Self-pay | Admitting: Nurse Practitioner

## 2017-03-28 ENCOUNTER — Ambulatory Visit (INDEPENDENT_AMBULATORY_CARE_PROVIDER_SITE_OTHER): Payer: Medicare Other

## 2017-03-28 ENCOUNTER — Ambulatory Visit (INDEPENDENT_AMBULATORY_CARE_PROVIDER_SITE_OTHER): Payer: Medicare Other | Admitting: Nurse Practitioner

## 2017-03-28 VITALS — BP 110/60 | HR 51 | Ht 67.0 in | Wt 214.5 lb

## 2017-03-28 DIAGNOSIS — I255 Ischemic cardiomyopathy: Secondary | ICD-10-CM

## 2017-03-28 DIAGNOSIS — I4891 Unspecified atrial fibrillation: Secondary | ICD-10-CM | POA: Diagnosis not present

## 2017-03-28 DIAGNOSIS — E1159 Type 2 diabetes mellitus with other circulatory complications: Secondary | ICD-10-CM | POA: Diagnosis not present

## 2017-03-28 DIAGNOSIS — Z5181 Encounter for therapeutic drug level monitoring: Secondary | ICD-10-CM

## 2017-03-28 DIAGNOSIS — E782 Mixed hyperlipidemia: Secondary | ICD-10-CM

## 2017-03-28 DIAGNOSIS — I10 Essential (primary) hypertension: Secondary | ICD-10-CM | POA: Diagnosis not present

## 2017-03-28 DIAGNOSIS — I2510 Atherosclerotic heart disease of native coronary artery without angina pectoris: Secondary | ICD-10-CM | POA: Diagnosis not present

## 2017-03-28 DIAGNOSIS — I5022 Chronic systolic (congestive) heart failure: Secondary | ICD-10-CM | POA: Diagnosis not present

## 2017-03-28 LAB — POCT INR: INR: 1.3

## 2017-03-28 NOTE — Patient Instructions (Addendum)
Medication Instructions: - Your physician recommends that you continue on your current medications as directed. Please refer to the Current Medication list given to you today.  Labwork: - Your physician recommends that you have lab work today: [employer]   Procedures/Testing: - none ordered  Follow-Up: - Your physician recommends that you schedule a follow-up appointment in: 2-3 months with Dr. Fletcher Anon   Any Additional Special Instructions Will Be Listed Below (If Applicable).     If you need a refill on your cardiac medications before your next appointment, please call your pharmacy.

## 2017-03-28 NOTE — Patient Instructions (Signed)
Please take extra tablet tonight, then start dosage of 1 pill every day except 1.5 pills on Mondays, Wednesdays and Fridays. Recheck in 2 weeks.

## 2017-03-28 NOTE — Progress Notes (Signed)
Office Visit    Patient Name: Meghan [name] Date of Encounter: 03/28/2017  Primary Care Provider:  Adline Potter, MD Primary Cardiologist:  Kathlyn Sacramento, MD  Chief Complaint    66 year old ? with a history of ischemic cardiomyopathy, HFrEF, coronary artery disease with chronic subtotal occlusion of the LAD on catheterization earlier this month, hypertension, hyperlipidemia, diabetes, stroke, carotid arterial disease status post endarterectomy on the left, and paroxysmal atrial fibrillation, who presents for follow-up.  Past Medical History    Past Medical History:  Diagnosis Date  . Carotid arterial disease ([place])    a. 02/2012 s/p L CEA.  . Coronary artery disease    a. 02/2012 Lexi MV: Anterior, anteroseptal, septal, apical infarct with mild peri-infarct ischemia, EF 41%- medically managed due to anemia; b. 03/2017 Cath: LM nl, LAD 99ost/p/m - faint L->L collaterals, RI 20ost, LCX 71m, RCA 20p, 62m-->Med Rx for chronic subtotal LAD dzs. Avoid R Radial access in future.  . 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 Hyperlipidemia   . Hypertension   . Ischemic cardiomyopathy    a. 06/2013 Echo: EF 35-40%, glob HK, mildly dil LA, mild LVH, mild TR, mild to mod MR; b. 07/2016 Echo: EF 25-30%, sev mid-apicalanteroseptal, ant, apical HK, mild MR, sev dil LA, mod dil RA, mod TR, PASP 54mmHg; c. 11/2016 Echo: EF 20-25%, antsept DK, Gr2 DD, mild MR, mod dil LA/RV/RA, mod red RV fxn, mod-sev TR, PASP 40-60mmHg.  . Morbid obesity ([place])   . Normocytic anemia   . Persistent atrial fibrillation (HCC)    a. CHA2DS2VASc = 7-->coumadin; maintaining sinus on amio.  . Stroke [facility]) 2013   Past Surgical History:  Procedure Laterality Date  . AMPUTATION TOE Left 09/22/2016   Procedure: AMPUTATION TOE;  Surgeon: Albertine Patricia, DPM;  Location: [facility] ORS;  Service: Podiatry;  Laterality: Left;  . CARDIOVERSION N/A 07/28/2016   Procedure: CARDIOVERSION;  Surgeon: Wellington Hampshire, MD;   Location: [facility] ORS;  Service: Cardiovascular;  Laterality: N/A;  . CARDIOVERSION N/A 08/07/2016   Procedure: CARDIOVERSION;  Surgeon: Wellington Hampshire, MD;  Location: [facility] ORS;  Service: Cardiovascular;  Laterality: N/A;  . CAROTID ENDARTERECTOMY     [facility]; Dr. Lucky Cowboy  . CHOLECYSTECTOMY    . LEG SURGERY     right;infection  . RIGHT/LEFT HEART CATH AND CORONARY ANGIOGRAPHY Bilateral 03/12/2017   Procedure: RIGHT/LEFT HEART CATH AND CORONARY ANGIOGRAPHY;  Surgeon: Wellington Hampshire, MD;  Location: [place] CV LAB;  Service: Cardiovascular;  Laterality: Bilateral;  . TONSILLECTOMY      Allergies  No Known Allergies  History of Present Illness    66 y/o ? with the above complex past medical history including coronary artery disease, ischemic cardia myopathy, HFrEF, paroxysmal atrial fibrillation, hypertension, hyperlipidemia, diabetes, obesity, and stroke.  She had prior abnormal stress testing in 2013 but did not undergo diagnostic catheterization.  Earlier this year, echocardiogram in April 2018 showed an EF of 25-30% in the setting of atrial fibrillation.  Though she later had resumption of sinus rhythm, she had persistent LV dysfunction by echocardiogram in August 2018, with an EF of 20-25%.  Initially, we planned a diagnostic catheterization however, she was found to be profoundly anemic with a hemoglobin of 7.9 in September 13.  Catheterization was canceled and she had additional primary care workup.  Subsequent H&H was found to be stable at office visit with Dr. Fletcher Anon in November, and decision was made to pursue diagnostic catheterization.  This was performed on December 3 and revealed a chronic subtotal occlusion of the LAD with faint left to left collaterals and otherwise moderate, diffuse nonobstructive CAD.  Medical therapy was recommended.    Since her catheterization, she has been doing well.  She denies chest pain or dyspnea.  Ambulation is difficult secondary to unsteady gait in the  setting of amputation of a toe on her left foot in June.  She is still waiting on a shoe.  She has not been weighing herself at home though her weight is down 5 pounds since her last visit.  She denies chest pain, dyspnea, palpitations, PND, orthopnea, dizziness, syncope, edema, or early satiety.  Home Medications    Prior to Admission medications   Medication Sig Start Date End Date Taking? Authorizing Provider  amiodarone (PACERONE) 200 MG tablet Take 0.5 tablets (100 mg total) by mouth daily. 01/03/17  Yes Plonk, Gwyndolyn Saxon, MD  atorvastatin (LIPITOR) 10 MG tablet TAKE ONE TABLET BY MOUTH ONCE DAILY Patient taking differently: TAKE ONE TABLET BY MOUTH ONCE DAILY AT NOON 10/23/16  Yes Wellington Hampshire, MD  [employer] Extract [phone] PSORIASIS MEDICATED EX) Apply 1 application topically daily as needed (psoriasis).   Yes [provider]  Ferrous Sulfate (IRON) 325 (65 Fe) MG TABS Take 1 tablet (325 mg total) by mouth daily. Patient taking differently: Take 5 tablets by mouth daily.  12/29/16  Yes Plonk, Gwyndolyn Saxon, MD  furosemide (LASIX) 40 MG tablet Take 0.5 tablets (20 mg total) by mouth daily. 03/07/17  Yes Wellington Hampshire, MD  glipiZIDE (GLUCOTROL) 10 MG tablet Take 5-15 mg by mouth 2 (two) times daily. Take 15 mg in the morning and 5 mg at night 08/13/15  Yes [provider]  lisinopril (PRINIVIL,ZESTRIL) 20 MG tablet Take 1 tablet (20 mg total) by mouth daily. 03/16/17 06/14/17 Yes Plonk, Gwyndolyn Saxon, MD  metoprolol succinate (TOPROL-XL) 100 MG 24 hr tablet Take 1 tablet (100 mg total) by mouth daily. Take with or immediately following a meal. Patient taking differently: Take 100 mg by mouth daily at 12 noon. Take with or immediately following a meal. 08/17/16  Yes Wellington Hampshire, MD  spironolactone (ALDACTONE) 25 MG tablet TAKE 1 TABLET BY MOUTH ONCE DAILY 01/31/17  Yes Wellington Hampshire, MD  warfarin (COUMADIN) 5 MG tablet TAKE 1 TABLET BY MOUTH ONCE DAILY OR AS DIRECT BY COUMADIN  CLINIC Patient taking differently: TAKE 1 TABLET BY MOUTH ONCE DAILY AT NOON 11/24/16  Yes Wellington Hampshire, MD    Review of Systems    Overall doing much better.  Today she denies chest pain, dyspnea, palpitations, PND, orthopnea, dizziness, syncope, or edema..  All other systems reviewed and are otherwise negative except as noted above.  Physical Exam    VS:  BP 110/60 (BP Location: Left Arm, Patient Position: Sitting, Cuff Size: Normal)   Pulse (!) 51   Ht 5\' 7"  (1.702 m)   Wt 214 lb 8 oz (97.3 kg)   BMI 33.60 kg/m  , BMI Body mass index is 33.6 kg/m. GEN: Well nourished, well developed, in no acute distress.  HEENT: normal.  Neck: Supple, no JVD, carotid bruits, or masses. Cardiac: RRR, no murmurs, rubs, or gallops. No clubbing, cyanosis, trace bilateral lower extremity edema.  Radials/DP/PT 1+ and equal bilaterally.  Respiratory:  Respirations regular and unlabored, bibasilar crackles. GI: Soft, nontender, nondistended, BS + x 4. MS: no deformity or atrophy. Skin: warm and dry, no rash. Neuro:  Strength and  sensation are intact. Psych: Normal affect.  Accessory Clinical Findings    ECG -sinus bradycardia, 51, first-degree AV block, left axis deviation, prior anterolateral infarct.  Assessment & Plan    1.  Coronary artery disease: Patient finally recently underwent diagnostic catheterization on December 3 revealing a chronic subtotal occlusion of the LAD with otherwise moderate nonobstructive CAD.  She is being medically managed with statin and beta-blocker therapy.  She is not on aspirin in the setting of chronic warfarin for paroxysmal atrial fibrillation.  She has been doing well without chest pain or dyspnea.  No changes today.  2.  Chronic systolic congestive heart failure/ischemic cardiomyopathy: EF 20-25% by echo in August 2018.  Recent diagnostic catheterization showed chronic subtotal occlusion of the LAD with faint left to left collaterals.  As above, medical  management for CAD.  Today, she is relatively euvolemic with only trace ankle edema..  She has been doing well at home with improved activity tolerance.  Her weight is down 5 pounds since December 3.  Labs were stable on December 5.  In the setting of weight loss on diuretic therapy with high normal potassium, I will follow-up a basic metabolic panel today.  She otherwise remains on beta-blocker, ACE inhibitor, and Spironolactone therapy.  If renal function and electrolytes are stable, we could consider transitioning to angiotensin receptor blocker with a long-term goal of getting her on Entresto.  Blood pressure is on the soft side at 253 systolic, that she probably would not tolerate very much Entresto.  She has not currently weighing herself.  We discussed the importance of daily weights, sodium restriction, medication compliance, and symptom reporting and she verbalizes understanding.  At some point in the future, we can look to repeat echocardiogram to reevaluate LV function on optical medical therapy and determine whether or not she is a candidate for ICD therapy.  3.  Paroxysmal atrial fibrillation: She denies palpitations.  In sinus rhythm today.  Continue beta-blocker and warfarin.  4.  Essential hypertension: Stable on current regimen.  5.  Hyperlipidemia: LDL 35 on September 19.  Continue statin therapy.  6.  Type 2 diabetes mellitus: On oral therapy.  Followed by primary care.  A1c 7.9 in September.  7.  Disposition: Follow-up basic metabolic panel today.  Follow-up in 2 months or sooner if necessary.   Murray Hodgkins, NP 03/28/2017, 3:20 PM

## 2017-03-29 ENCOUNTER — Other Ambulatory Visit: Payer: Self-pay | Admitting: Cardiovascular Disease

## 2017-03-29 LAB — BASIC METABOLIC PANEL
BUN/Creatinine Ratio: 26 (ref 12–28)
BUN: 38 mg/dL — AB (ref 8–27)
CALCIUM: 9.5 mg/dL (ref 8.7–10.3)
CO2: 21 mmol/L (ref 20–29)
CREATININE: 1.48 mg/dL — AB (ref 0.57–1.00)
Chloride: 104 mmol/L (ref 96–106)
GFR, EST AFRICAN AMERICAN: 42 mL/min/{1.73_m2} — AB (ref >59)
GFR, EST NON AFRICAN AMERICAN: 36 mL/min/{1.73_m2} — AB (ref >59)
Glucose: 99 mg/dL (ref 65–99)
Potassium: 4.8 mmol/L (ref 3.5–5.2)
Sodium: 138 mmol/L (ref 134–144)

## 2017-03-29 NOTE — Telephone Encounter (Signed)
Please review for refill. Thanks!  

## 2017-03-30 ENCOUNTER — Emergency Department: Payer: Medicare Other

## 2017-03-30 ENCOUNTER — Encounter: Payer: Self-pay | Admitting: Emergency Medicine

## 2017-03-30 ENCOUNTER — Other Ambulatory Visit: Payer: Self-pay

## 2017-03-30 ENCOUNTER — Inpatient Hospital Stay
Admission: EM | Admit: 2017-03-30 | Discharge: 2017-04-01 | DRG: 872 | Disposition: A | Discharge status: Home / Self Care | Payer: Medicare Other | Attending: Internal Medicine | Admitting: Internal Medicine

## 2017-03-30 DIAGNOSIS — A419 Sepsis, unspecified organism: Principal | ICD-10-CM

## 2017-03-30 DIAGNOSIS — E871 Hypo-osmolality and hyponatremia: Secondary | ICD-10-CM | POA: Diagnosis present

## 2017-03-30 DIAGNOSIS — N183 Chronic kidney disease, stage 3 (moderate): Secondary | ICD-10-CM | POA: Diagnosis present

## 2017-03-30 DIAGNOSIS — L089 Local infection of the skin and subcutaneous tissue, unspecified: Secondary | ICD-10-CM | POA: Diagnosis not present

## 2017-03-30 DIAGNOSIS — I255 Ischemic cardiomyopathy: Secondary | ICD-10-CM | POA: Diagnosis present

## 2017-03-30 DIAGNOSIS — Z87891 Personal history of nicotine dependence: Secondary | ICD-10-CM | POA: Diagnosis not present

## 2017-03-30 DIAGNOSIS — Z7901 Long term (current) use of anticoagulants: Secondary | ICD-10-CM

## 2017-03-30 DIAGNOSIS — I481 Persistent atrial fibrillation: Secondary | ICD-10-CM | POA: Diagnosis present

## 2017-03-30 DIAGNOSIS — L03032 Cellulitis of left toe: Secondary | ICD-10-CM

## 2017-03-30 DIAGNOSIS — Z6834 Body mass index (BMI) 34.0-34.9, adult: Secondary | ICD-10-CM

## 2017-03-30 DIAGNOSIS — L97529 Non-pressure chronic ulcer of other part of left foot with unspecified severity: Secondary | ICD-10-CM | POA: Diagnosis present

## 2017-03-30 DIAGNOSIS — E785 Hyperlipidemia, unspecified: Secondary | ICD-10-CM | POA: Diagnosis present

## 2017-03-30 DIAGNOSIS — Z89412 Acquired absence of left great toe: Secondary | ICD-10-CM | POA: Diagnosis not present

## 2017-03-30 DIAGNOSIS — Z66 Do not resuscitate: Secondary | ICD-10-CM | POA: Diagnosis present

## 2017-03-30 DIAGNOSIS — I482 Chronic atrial fibrillation: Secondary | ICD-10-CM | POA: Diagnosis present

## 2017-03-30 DIAGNOSIS — L97521 Non-pressure chronic ulcer of other part of left foot limited to breakdown of skin: Secondary | ICD-10-CM | POA: Diagnosis not present

## 2017-03-30 DIAGNOSIS — I129 Hypertensive chronic kidney disease with stage 1 through stage 4 chronic kidney disease, or unspecified chronic kidney disease: Secondary | ICD-10-CM | POA: Diagnosis present

## 2017-03-30 DIAGNOSIS — E11621 Type 2 diabetes mellitus with foot ulcer: Secondary | ICD-10-CM | POA: Diagnosis present

## 2017-03-30 DIAGNOSIS — Z79899 Other long term (current) drug therapy: Secondary | ICD-10-CM

## 2017-03-30 DIAGNOSIS — I2510 Atherosclerotic heart disease of native coronary artery without angina pectoris: Secondary | ICD-10-CM | POA: Diagnosis present

## 2017-03-30 DIAGNOSIS — Z7984 Long term (current) use of oral hypoglycemic drugs: Secondary | ICD-10-CM | POA: Diagnosis not present

## 2017-03-30 DIAGNOSIS — E119 Type 2 diabetes mellitus without complications: Secondary | ICD-10-CM | POA: Diagnosis not present

## 2017-03-30 DIAGNOSIS — E1122 Type 2 diabetes mellitus with diabetic chronic kidney disease: Secondary | ICD-10-CM | POA: Diagnosis present

## 2017-03-30 DIAGNOSIS — E1142 Type 2 diabetes mellitus with diabetic polyneuropathy: Secondary | ICD-10-CM | POA: Diagnosis not present

## 2017-03-30 DIAGNOSIS — Z8673 Personal history of transient ischemic attack (TIA), and cerebral infarction without residual deficits: Secondary | ICD-10-CM | POA: Diagnosis not present

## 2017-03-30 DIAGNOSIS — M205X2 Other deformities of toe(s) (acquired), left foot: Secondary | ICD-10-CM | POA: Diagnosis present

## 2017-03-30 DIAGNOSIS — M7989 Other specified soft tissue disorders: Secondary | ICD-10-CM | POA: Diagnosis not present

## 2017-03-30 DIAGNOSIS — L03116 Cellulitis of left lower limb: Secondary | ICD-10-CM | POA: Diagnosis not present

## 2017-03-30 DIAGNOSIS — I10 Essential (primary) hypertension: Secondary | ICD-10-CM | POA: Diagnosis not present

## 2017-03-30 LAB — HEMOGLOBIN A1C
Hgb A1c MFr Bld: 8.4 % — ABNORMAL HIGH (ref 4.8–5.6)
MEAN PLASMA GLUCOSE: 194.38 mg/dL

## 2017-03-30 LAB — COMPREHENSIVE METABOLIC PANEL
ALT: 61 U/L — AB (ref 14–54)
ANION GAP: 9 (ref 5–15)
AST: 63 U/L — ABNORMAL HIGH (ref 15–41)
Albumin: 3.4 g/dL — ABNORMAL LOW (ref 3.5–5.0)
Alkaline Phosphatase: 190 U/L — ABNORMAL HIGH (ref 38–126)
BUN: 39 mg/dL — ABNORMAL HIGH (ref 6–20)
CHLORIDE: 101 mmol/L (ref 101–111)
CO2: 24 mmol/L (ref 22–32)
CREATININE: 1.47 mg/dL — AB (ref 0.44–1.00)
Calcium: 9.2 mg/dL (ref 8.9–10.3)
GFR calc non Af Amer: 35 mL/min — ABNORMAL LOW (ref >60)
GFR, EST AFRICAN AMERICAN: 41 mL/min — AB (ref >60)
Glucose, Bld: 187 mg/dL — ABNORMAL HIGH (ref 65–99)
Potassium: 4.9 mmol/L (ref 3.5–5.1)
SODIUM: 134 mmol/L — AB (ref 135–145)
Total Bilirubin: 1.6 mg/dL — ABNORMAL HIGH (ref 0.3–1.2)
Total Protein: 9.1 g/dL — ABNORMAL HIGH (ref 6.5–8.1)

## 2017-03-30 LAB — CBC WITH DIFFERENTIAL/PLATELET
Basophils Absolute: 0.1 10*3/uL (ref 0–0.1)
Basophils Relative: 0 %
Eosinophils Absolute: 0.3 10*3/uL (ref 0–0.7)
Eosinophils Relative: 2 %
HEMATOCRIT: 34.9 % — AB (ref 35.0–47.0)
HEMOGLOBIN: 11.4 g/dL — AB (ref 12.0–16.0)
LYMPHS ABS: 0.5 10*3/uL — AB (ref 1.0–3.6)
LYMPHS PCT: 3 %
MCH: 28.2 pg (ref 26.0–34.0)
MCHC: 32.6 g/dL (ref 32.0–36.0)
MCV: 86.6 fL (ref 80.0–100.0)
MONOS PCT: 9 %
Monocytes Absolute: 1.4 10*3/uL — ABNORMAL HIGH (ref 0.2–0.9)
NEUTROS ABS: 13.7 10*3/uL — AB (ref 1.4–6.5)
NEUTROS PCT: 86 %
Platelets: 169 10*3/uL (ref 150–440)
RBC: 4.03 MIL/uL (ref 3.80–5.20)
RDW: 23.3 % — ABNORMAL HIGH (ref 11.5–14.5)
WBC: 15.9 10*3/uL — ABNORMAL HIGH (ref 3.6–11.0)

## 2017-03-30 LAB — LACTIC ACID, PLASMA
LACTIC ACID, VENOUS: 1.1 mmol/L (ref 0.5–1.9)
Lactic Acid, Venous: 1.2 mmol/L (ref 0.5–1.9)

## 2017-03-30 LAB — GLUCOSE, CAPILLARY: Glucose-Capillary: 209 mg/dL — ABNORMAL HIGH (ref 65–99)

## 2017-03-30 LAB — PROCALCITONIN: PROCALCITONIN: 1 ng/mL

## 2017-03-30 LAB — SEDIMENTATION RATE: Sed Rate: 89 mm/hr — ABNORMAL HIGH (ref 0–30)

## 2017-03-30 LAB — INFLUENZA PANEL BY PCR (TYPE A & B)
INFLBPCR: NEGATIVE
Influenza A By PCR: NEGATIVE

## 2017-03-30 LAB — PROTIME-INR
INR: 1.59
Prothrombin Time: 18.8 seconds — ABNORMAL HIGH (ref 11.4–15.2)

## 2017-03-30 MED ORDER — SPIRONOLACTONE 25 MG PO TABS
25.0000 mg | ORAL_TABLET | Freq: Every day | ORAL | Status: DC
  Administered 2017-03-30 – 2017-04-01 (×3): 25 mg via ORAL
  Filled 2017-03-30 (×3): qty 1

## 2017-03-30 MED ORDER — ACETAMINOPHEN 325 MG PO TABS: 650.0000 mg | ORAL_TABLET | Freq: Four times a day (QID) | ORAL | Status: DC | PRN

## 2017-03-30 MED ORDER — INSULIN ASPART 100 UNIT/ML ~~LOC~~ SOLN
0.0000 [IU] | Freq: Every day | SUBCUTANEOUS | Status: DC
  Administered 2017-03-30: 2 [IU] via SUBCUTANEOUS
  Filled 2017-03-30: qty 1

## 2017-03-30 MED ORDER — AMIODARONE HCL 200 MG PO TABS
100.0000 mg | ORAL_TABLET | Freq: Every day | ORAL | Status: DC
  Administered 2017-03-30 – 2017-04-01 (×3): 100 mg via ORAL
  Filled 2017-03-30 (×3): qty 1

## 2017-03-30 MED ORDER — SODIUM CHLORIDE 0.9% FLUSH
3.0000 mL | Freq: Two times a day (BID) | INTRAVENOUS | Status: DC
  Administered 2017-03-30 – 2017-04-01 (×2): 3 mL via INTRAVENOUS

## 2017-03-30 MED ORDER — ACETAMINOPHEN 650 MG RE SUPP
650.0000 mg | Freq: Four times a day (QID) | RECTAL | Status: DC | PRN
Start: 2017-03-30 — End: 2017-04-01

## 2017-03-30 MED ORDER — KETOROLAC TROMETHAMINE 30 MG/ML IJ SOLN
30.0000 mg | Freq: Once | INTRAMUSCULAR | Status: AC
  Administered 2017-03-30: 30 mg via INTRAVENOUS

## 2017-03-30 MED ORDER — ALBUTEROL SULFATE (2.5 MG/3ML) 0.083% IN NEBU: 2.5000 mg | INHALATION_SOLUTION | RESPIRATORY_TRACT | Status: DC | PRN

## 2017-03-30 MED ORDER — ONDANSETRON HCL 4 MG PO TABS: 4.0000 mg | ORAL_TABLET | Freq: Four times a day (QID) | ORAL | Status: DC | PRN

## 2017-03-30 MED ORDER — SODIUM CHLORIDE 0.9% FLUSH: 3.0000 mL | INTRAVENOUS | Status: DC | PRN

## 2017-03-30 MED ORDER — ONDANSETRON HCL 4 MG/2ML IJ SOLN: 4.0000 mg | Freq: Four times a day (QID) | INTRAMUSCULAR | Status: DC | PRN

## 2017-03-30 MED ORDER — LISINOPRIL 20 MG PO TABS
20.0000 mg | ORAL_TABLET | Freq: Every day | ORAL | Status: DC
  Administered 2017-03-30 – 2017-04-01 (×3): 20 mg via ORAL
  Filled 2017-03-30 (×3): qty 1

## 2017-03-30 MED ORDER — FUROSEMIDE 20 MG PO TABS
20.0000 mg | ORAL_TABLET | Freq: Every day | ORAL | Status: DC
  Administered 2017-03-30 – 2017-04-01 (×3): 20 mg via ORAL
  Filled 2017-03-30 (×3): qty 1

## 2017-03-30 MED ORDER — SENNOSIDES-DOCUSATE SODIUM 8.6-50 MG PO TABS: 1.0000 | ORAL_TABLET | Freq: Every evening | ORAL | Status: DC | PRN

## 2017-03-30 MED ORDER — PIPERACILLIN-TAZOBACTAM 3.375 G IVPB
3.3750 g | Freq: Three times a day (TID) | INTRAVENOUS | Status: DC
  Administered 2017-03-31 – 2017-04-01 (×4): 3.375 g via INTRAVENOUS
  Filled 2017-03-30 (×6): qty 50

## 2017-03-30 MED ORDER — PIPERACILLIN-TAZOBACTAM 3.375 G IVPB 30 MIN
3.3750 g | Freq: Once | INTRAVENOUS | Status: AC
  Administered 2017-03-30: 3.375 g via INTRAVENOUS
  Filled 2017-03-30: qty 50

## 2017-03-30 MED ORDER — VANCOMYCIN HCL IN DEXTROSE 1-5 GM/200ML-% IV SOLN
1000.0000 mg | Freq: Once | INTRAVENOUS | Status: AC
  Administered 2017-03-30: 1000 mg via INTRAVENOUS
  Filled 2017-03-30: qty 200

## 2017-03-30 MED ORDER — VANCOMYCIN HCL 10 G IV SOLR
1250.0000 mg | INTRAVENOUS | Status: DC
  Administered 2017-03-31 – 2017-04-01 (×2): 1250 mg via INTRAVENOUS
  Filled 2017-03-30 (×3): qty 1250

## 2017-03-30 MED ORDER — FERROUS SULFATE 325 (65 FE) MG PO TABS
325.0000 mg | ORAL_TABLET | Freq: Every day | ORAL | Status: DC
  Administered 2017-03-31 – 2017-04-01 (×2): 325 mg via ORAL
  Filled 2017-03-30 (×2): qty 1

## 2017-03-30 MED ORDER — HYDROCODONE-ACETAMINOPHEN 5-325 MG PO TABS
1.0000 | ORAL_TABLET | ORAL | Status: DC | PRN
  Administered 2017-03-30: 2 via ORAL
  Filled 2017-03-30: qty 2

## 2017-03-30 MED ORDER — METOPROLOL SUCCINATE ER 50 MG PO TB24
100.0000 mg | ORAL_TABLET | Freq: Every day | ORAL | Status: DC
Start: 2017-03-31 — End: 2017-04-01
  Administered 2017-03-31: 100 mg via ORAL
  Filled 2017-03-30: qty 2

## 2017-03-30 MED ORDER — KETOROLAC TROMETHAMINE 15 MG/ML IJ SOLN: 15.0000 mg | Freq: Four times a day (QID) | INTRAMUSCULAR | Status: DC | PRN

## 2017-03-30 MED ORDER — KETOROLAC TROMETHAMINE 30 MG/ML IJ SOLN
INTRAMUSCULAR | Status: AC
  Filled 2017-03-30: qty 1

## 2017-03-30 MED ORDER — ATORVASTATIN CALCIUM 10 MG PO TABS
10.0000 mg | ORAL_TABLET | Freq: Every day | ORAL | Status: DC
  Administered 2017-03-30 – 2017-04-01 (×3): 10 mg via ORAL
  Filled 2017-03-30 (×3): qty 1

## 2017-03-30 MED ORDER — INSULIN ASPART 100 UNIT/ML ~~LOC~~ SOLN
0.0000 [IU] | Freq: Three times a day (TID) | SUBCUTANEOUS | Status: DC
  Administered 2017-03-31: 3 [IU] via SUBCUTANEOUS
  Administered 2017-03-31 – 2017-04-01 (×2): 2 [IU] via SUBCUTANEOUS
  Filled 2017-03-30 (×3): qty 1

## 2017-03-30 MED ORDER — BISACODYL 5 MG PO TBEC: 5.0000 mg | DELAYED_RELEASE_TABLET | Freq: Every day | ORAL | Status: DC | PRN

## 2017-03-30 MED ORDER — SODIUM CHLORIDE 0.9 % IV SOLN
250.0000 mL | INTRAVENOUS | Status: DC | PRN
Start: 2017-03-30 — End: 2017-04-01

## 2017-03-30 NOTE — Progress Notes (Signed)
ANTICOAGULATION CONSULT NOTE - Initial Consult  Pharmacy Consult for warfarin Indication: atrial fibrillation  No Known Allergies  Patient Measurements: Height: 5\' 7"  (170.2 cm) Weight: 217 lb 6.4 oz (98.6 kg) IBW/kg (Calculated) : 61.6  Vital Signs: Temp: 98.4 F (36.9 C) (12/21 2007) Temp Source: Oral (12/21 2007) BP: 120/40 (12/21 2007) Pulse Rate: 58 (12/21 2007)  Labs: Recent Labs    03/28/17 1350 03/28/17 1516 03/30/17 1548 03/30/17 1619  HGB  --   --  11.4*  --   HCT  --   --  34.9*  --   PLT  --   --  169  --   LABPROT  --   --   --  18.8*  INR 1.3  --   --  1.59  CREATININE  --  1.48* 1.47*  --     Estimated Creatinine Clearance: 44.2 mL/min (A) (by C-G formula based on SCr of 1.47 mg/dL (H)).   Medical History: Past Medical History:  Diagnosis Date  . Carotid arterial disease ([place])    a. 02/2012 s/p L CEA.  . Coronary artery disease    a. 02/2012 Lexi MV: Anterior, anteroseptal, septal, apical infarct with mild peri-infarct ischemia, EF 41%- medically managed due to anemia; b. 03/2017 Cath: LM nl, LAD 99ost/p/m - faint L->L collaterals, RI 20ost, LCX 35m, RCA 20p, 52m-->Med Rx for chronic subtotal LAD dzs. Avoid R Radial access in future.  . 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 Hyperlipidemia   . Hypertension   . Ischemic cardiomyopathy    a. 06/2013 Echo: EF 35-40%, glob HK, mildly dil LA, mild LVH, mild TR, mild to mod MR; b. 07/2016 Echo: EF 25-30%, sev mid-apicalanteroseptal, ant, apical HK, mild MR, sev dil LA, mod dil RA, mod TR, PASP 66mmHg; c. 11/2016 Echo: EF 20-25%, antsept DK, Gr2 DD, mild MR, mod dil LA/RV/RA, mod red RV fxn, mod-sev TR, PASP 40-51mmHg.  . Morbid obesity ([place])   . Normocytic anemia   . Persistent atrial fibrillation (HCC)    a. CHA2DS2VASc = 7-->coumadin; maintaining sinus on amio.  . Stroke [facility]) 2013     Assessment: 66 yo female on warfarin PTA. Med list states she takes 5 mg 5 days per week and 7.5  mg two days per week. Med rec states she took dose this AM.   Goal of Therapy:  INR 2-3 Monitor platelets by anticoagulation protocol: Yes   Plan:  Warfarin already taken this AM. Will follow up INR in AM   Rayna Sexton L 03/30/2017,10:24 PM

## 2017-03-30 NOTE — Progress Notes (Signed)
Pharmacy Antibiotic Note  Meghan [name] is a 66 y.o. female admitted on 03/30/2017 with sepsis.  Pharmacy has been consulted for vancomycin and Zosyn dosing.  Plan: Initiate Zosyn 3.375 g q8h  Initiate vancomycin 1250 mg q24h  Vancomycin trough goal 15-20  Will check vancomycin trough before fourth dose   T1/2: 16.9  Ke: 0.041  Vd: 53.1  Cmin: 16.8  Cmax: 43     Temp (24hrs), Avg:99.6 F (37.6 C), Min:99.6 F (37.6 C), Max:99.6 F (37.6 C)  Recent Labs  Lab 03/28/17 1516 03/30/17 1548 03/30/17 1619  WBC  --  15.9*  --   CREATININE 1.48* 1.47*  --   LATICACIDVEN  --  1.2 1.1    Estimated Creatinine Clearance: 43.9 mL/min (A) (by C-G formula based on SCr of 1.47 mg/dL (H)).    No Known Allergies  Antimicrobials this admission: 12/21 Vanc >>  12/21 Zosyn >>    Microbiology results: 12/21 BCx: Sent    Thank you for allowing pharmacy to be a part of this patient's care.  [place] Resident  03/30/2017 6:15 PM

## 2017-03-30 NOTE — ED Notes (Addendum)
Xray at bedside at this time. Nurse has spoken with pt about the need for a urine sample but pt stating that she will attempt later.

## 2017-03-30 NOTE — ED Triage Notes (Signed)
Pt had left grea toe amputation hx a few months ago,  pt states she has a hole in toe on left foot. Pt states this morning 1st toe on left foot is red and has decreased sensation. Pt also had fever of 103 at home per family. I clarified it was no 100.3 but 103.  Pt states she took 4 ibuprofen.  Pt has hx of diabetes.

## 2017-03-30 NOTE — ED Provider Notes (Signed)
[facility] Emergency Department Provider Note  Time seen: 4:38 PM  I have reviewed the triage vital signs and the nursing notes.   HISTORY  Chief Complaint Wound Infection (Diabetic Wound)    HPI Meghan [name] is a 66 y.o. female with a past medical history of diabetes, hypertension, hyperlipidemia, cardiomyopathy, atrial fibrillation on warfarin who presents to the emergency department with a fever and possible toe infection.  According to the patient she has peripheral neuropathy and has minimal feeling in her feet at baseline.  Patient states today she noticed a red swollen second left toe.  Patient is status post first toe amputation.  Patient states today she had a fever of 103 at home and took 4 ibuprofen prior to coming to the emergency department.  Patient denies any other symptoms such as cough, congestion, shortness of breath chest pain abdominal pain nausea vomiting diarrhea or dysuria.  However the patient states a family member had a 103 fever yesterday with vomiting at home and was seen here but she is not sure what he was diagnosed with.  Patient states prior to yesterday she believes the toe was normal in appearance but she is not completely sure.  States she was on her feet for a very long time yesterday.   Past Medical History:  Diagnosis Date  . Carotid arterial disease ([place])    a. 02/2012 s/p L CEA.  . Coronary artery disease    a. 02/2012 Lexi MV: Anterior, anteroseptal, septal, apical infarct with mild peri-infarct ischemia, EF 41%- medically managed due to anemia; b. 03/2017 Cath: LM nl, LAD 99ost/p/m - faint L->L collaterals, RI 20ost, LCX 23m RCA 20p, 428m>Med Rx for chronic subtotal LAD dzs. Avoid R Radial access in future.  . 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 Hyperlipidemia   . Hypertension   . Ischemic cardiomyopathy    a. 06/2013 Echo: EF 35-40%, glob HK, mildly dil LA, mild LVH, mild TR, mild to mod MR; b.  07/2016 Echo: EF 25-30%, sev mid-apicalanteroseptal, ant, apical HK, mild MR, sev dil LA, mod dil RA, mod TR, PASP 4075m; c. 11/2016 Echo: EF 20-25%, antsept DK, Gr2 DD, mild MR, mod dil LA/RV/RA, mod red RV fxn, mod-sev TR, PASP 40-69m75m  . Morbid obesity (HCC)[place]. Normocytic anemia   . Persistent atrial fibrillation (HCC)    a. CHA2DS2VASc = 7-->coumadin; maintaining sinus on amio.  [facility]    Patient Active Problem List   Diagnosis Date Noted  . Elevated TSH 12/29/2016  . Psoriasis 12/26/2016  . Obesity, Class III, BMI 40-49.9 (morbid obesity) (HCC)[place]/18/2018  . Iron deficiency anemia 12/25/2016  . Toe osteomyelitis, left (HCC)[place]/14/2018  . CKD (chronic kidney disease), stage III (HCC)[place]/18/2018  . History of CVA (cerebrovascular accident) without residual deficits 09/03/2013  . Hyperlipidemia 09/03/2013  . Type 2 diabetes mellitus with peripheral neuropathy (HCC)[place]/27/2015  . Paroxysmal atrial fibrillation (HCC)[place]/11/2013  . Ischemic cardiomyopathy 07/16/2013  . Hypertension 07/16/2013  . Chronic systolic heart failure (HCC)[place]/06/2012  . Coronary atherosclerosis   . Dyspnea 03/01/2012    Past Surgical History:  Procedure Laterality Date  . AMPUTATION TOE Left 09/22/2016   Procedure: AMPUTATION TOE;  Surgeon: TroxAlbertine PatriciaM;  Location: [facility] ORS;  Service: Podiatry;  Laterality: Left;  . CARDIOVERSION N/A 07/28/2016   Procedure: CARDIOVERSION;  Surgeon: MuhaWellington Hampshire;  Location: [facility] ORS;  Service: Cardiovascular;  Laterality: N/A;  .  CARDIOVERSION N/A 08/07/2016   Procedure: CARDIOVERSION;  Surgeon: Wellington Hampshire, MD;  Location: [facility] ORS;  Service: Cardiovascular;  Laterality: N/A;  . CAROTID ENDARTERECTOMY     [facility]; Dr. Lucky Cowboy  . CHOLECYSTECTOMY    . LEG SURGERY     right;infection  . RIGHT/LEFT HEART CATH AND CORONARY ANGIOGRAPHY Bilateral 03/12/2017   Procedure: RIGHT/LEFT HEART CATH AND CORONARY ANGIOGRAPHY;  Surgeon: Wellington Hampshire, MD;   Location: [place] CV LAB;  Service: Cardiovascular;  Laterality: Bilateral;  . TONSILLECTOMY      Prior to Admission medications   Medication Sig Start Date End Date Taking? Authorizing Provider  amiodarone (PACERONE) 200 MG tablet Take 0.5 tablets (100 mg total) by mouth daily. 01/03/17   Plonk, Gwyndolyn Saxon, MD  atorvastatin (LIPITOR) 10 MG tablet TAKE ONE TABLET BY MOUTH ONCE DAILY Patient taking differently: TAKE ONE TABLET BY MOUTH ONCE DAILY AT NOON 10/23/16   Wellington Hampshire, MD  [employer] Extract [phone] PSORIASIS MEDICATED EX) Apply 1 application topically daily as needed (psoriasis).    [provider]  Ferrous Sulfate (IRON) 325 (65 Fe) MG TABS Take 1 tablet (325 mg total) by mouth daily. Patient taking differently: Take 5 tablets by mouth daily.  12/29/16   Plonk, Gwyndolyn Saxon, MD  furosemide (LASIX) 40 MG tablet Take 0.5 tablets (20 mg total) by mouth daily. 03/07/17   Wellington Hampshire, MD  glipiZIDE (GLUCOTROL) 10 MG tablet Take 5-15 mg by mouth 2 (two) times daily. Take 15 mg in the morning and 5 mg at night 08/13/15   [provider]  lisinopril (PRINIVIL,ZESTRIL) 20 MG tablet Take 1 tablet (20 mg total) by mouth daily. 03/16/17 06/14/17  Plonk, Gwyndolyn Saxon, MD  metoprolol succinate (TOPROL-XL) 100 MG 24 hr tablet Take 1 tablet (100 mg total) by mouth daily. Take with or immediately following a meal. Patient taking differently: Take 100 mg by mouth daily at 12 noon. Take with or immediately following a meal. 08/17/16   Wellington Hampshire, MD  spironolactone (ALDACTONE) 25 MG tablet TAKE 1 TABLET BY MOUTH ONCE DAILY 01/31/17   Wellington Hampshire, MD  warfarin (COUMADIN) 5 MG tablet TAKE 1 TABLET BY MOUTH ONCE DAILY OR AS DIRECT BY COUMADIN CLINIC Patient taking differently: TAKE 1 TABLET BY MOUTH ONCE DAILY AT NOON 11/24/16   Wellington Hampshire, MD    No Known Allergies  Family History  Problem Relation Age of Onset  . Heart disease Mother     Social History Social History    Tobacco Use  . Smoking status: Former Smoker    Packs/day: 0.50    Years: 5.00    Pack years: 2.50    Types: Cigarettes  . Smokeless tobacco: Never Used  Substance Use Topics  . Alcohol use: No  . Drug use: No    Review of Systems Constitutional: Fever to 103 at home ENT: Negative for congestion Cardiovascular: Negative for chest pain. Respiratory: Negative for shortness of breath.  Negative for cough Gastrointestinal: Negative for abdominal pain, vomiting and diarrhea. Genitourinary: Negative for dysuria. Neurological: Negative for headache All other ROS negative  ____________________________________________   PHYSICAL EXAM:  VITAL SIGNS: ED Triage Vitals  Enc Vitals Group     BP 03/30/17 1542 116/61     Pulse Rate 03/30/17 1542 (!) 59     Resp 03/30/17 1542 16     Temp 03/30/17 1542 99.6 F (37.6 C)     Temp Source 03/30/17 1542 Oral     SpO2  03/30/17 1542 94 %     Weight --      Height --      Head Circumference --      Peak Flow --      Pain Score 03/30/17 1543 0     Pain Loc --      Pain Edu? --      Excl. in [place]? --    Constitutional: Alert and oriented. Well appearing and in no distress. Eyes: Normal exam ENT   Head: Normocephalic and atraumatic.   Mouth/Throat: Mucous membranes are moist. Cardiovascular: Normal rate, regular rhythm. No murmurs, rubs, or gallops. Respiratory: Normal respiratory effort without tachypnea nor retractions. Breath sounds are clear and equal bilaterally. No wheezes/rales/rhonchi. Gastrointestinal: Soft and nontender. No distention Musculoskeletal: Patient is status post left first toe amputation, second toe is moderately swollen with moderate erythema extending to the base of the toe, there does appear to be an older appearing ulceration to the distal tip of the toe.  No apparent drainage. Neurologic:  Normal speech and language. No gross focal neurologic deficits Skin:  Skin is warm, moderately erythematous with mild  swelling of the left second toe Psychiatric: Mood and affect are normal.   ____________________________________________    EKG  EKG reviewed and interpreted by myself shows normal sinus rhythm at 61 bpm with a narrow QRS, left axis deviation, largely normal intervals nonspecific ST changes.  ____________________________________________    RADIOLOGY  X-ray negative for osteomyelitis ____________________________________________   INITIAL IMPRESSION / ASSESSMENT AND PLAN / ED COURSE  Pertinent labs & imaging results that were available during my care of the patient were reviewed by me and considered in my medical decision making (see chart for details).  Patient presents to the emergency department with a fever to 103 at home and a red and swollen left second toe which she states just started today.  Differential would include cellulitis, osteomyelitis, fever due to other infection such as viral illness, influenza, urinary tract infection, etc.  Patient has a largely negative review of systems however she does have recent exposure to someone sick at home which she states had a fever 103 and vomiting yesterday.  We will check labs including lactic acid as the patient would technically meet sepsis criteria if her temperature was that elevated at home.  Temperature in the emergency department currently 99.6.  We will dose IV antibiotics for presumed cellulitis.  We will also check an influenza swab given recent sick contact with high fever and vomiting.  We will continue to closely monitor in the emergency department.  Overall the patient appears well, no distress.  Patient's labs have resulted with a white blood cell count of 16,000, lactic acid is normal but ESR is considerably elevated.  Given the patient's fever of 103 at home with a white blood cell count of 16,000 and signs of cellulitis we will admit the patient to the hospital for further treatment.  X-ray is read as negative for  osteomyelitis.    ____________________________________________   FINAL CLINICAL IMPRESSION(S) / ED DIAGNOSES  Sepsis Fever Cellulitis    Harvest Dark, MD 03/30/17 [phone]

## 2017-03-30 NOTE — ED Notes (Signed)
Pt has what appears to be a diabetic ulcer to her 2nd toe on her left foot. Ulcer appear to be healing but toe is now red and inflamed in appearance. Pt stating that this started this morning and was thinking it was caused by her shoes from standing all day yesterday. Pt stating that she was running a 103 fever PTA and took Ibuprofen. Pt is denying any pain, but has decreased sensitivity to extremities because of neuropathy from diabetes. Pt has had her 1st toe amputated on the same foot within the last 6 months.

## 2017-03-30 NOTE — ED Notes (Signed)
Pt is experiencing back spasms and states that she gets them from time to time. Dr. Truitt Merle is aware and speaking with pt at this time.

## 2017-03-30 NOTE — H&P (Addendum)
Meghan [name]    MR#:  [id number]  DATE OF BIRTH:  1950-08-10  DATE OF ADMISSION:  03/30/2017  PRIMARY CARE PHYSICIAN: Adline Potter, MD   REQUESTING/REFERRING PHYSICIAN: Harvest Dark, MD  CHIEF COMPLAINT:   Chief Complaint  Patient presents with  . Wound Infection    Diabetic Wound   Left second toe infection. HISTORY OF PRESENT ILLNESS:  Meghan [name]  is a 66 y.o. female with a known history of multiple medical problems as below. The patient presented the ED with fever and possible left second toe infection.  She said she was fine until today she noticed right and swelling of the second left toe.  The patient also has fever of 103 and took ibuprofen coming to ED.  She was found septic and given antibiotics in the ED.  X-ray did not show any osteomyelitis. PAST MEDICAL HISTORY:   Past Medical History:  Diagnosis Date  . Carotid arterial disease ([place])    a. 02/2012 s/p L CEA.  . Coronary artery disease    a. 02/2012 [facility]i MV: Anterior, anteroseptal, septal, apical infarct with mild peri-infarct ischemia, EF 41%- medically managed due to anemia; b. 03/2017 Cath: LM nl, LAD 99ost/p/m - faint L->L collaterals, RI 20ost, LCX 90m, RCA 20p, 2m-->Med Rx for chronic subtotal LAD dzs. Avoid R Radial access in future.  . 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 Hyperlipidemia   . Hypertension   . Ischemic cardiomyopathy    a. 06/2013 Echo: EF 35-40%, glob HK, mildly dil LA, mild LVH, mild TR, mild to mod MR; b. 07/2016 Echo: EF 25-30%, sev mid-apicalanteroseptal, ant, apical HK, mild MR, sev dil LA, mod dil RA, mod TR, PASP 77mmHg; c. 11/2016 Echo: EF 20-25%, antsept DK, Gr2 DD, mild MR, mod dil LA/RV/RA, mod red RV fxn, mod-sev TR, PASP 40-12mmHg.  . Morbid obesity ([place])   . Normocytic anemia   . Persistent atrial fibrillation (HCC)    a. CHA2DS2VASc = 7-->coumadin; maintaining sinus on amio.  .  Stroke [facility]) 2013    PAST SURGICAL HISTORY:   Past Surgical History:  Procedure Laterality Date  . AMPUTATION TOE Left 09/22/2016   Procedure: AMPUTATION TOE;  Surgeon: Albertine Patricia, DPM;  Location: [facility] ORS;  Service: Podiatry;  Laterality: Left;  . CARDIOVERSION N/A 07/28/2016   Procedure: CARDIOVERSION;  Surgeon: Wellington Hampshire, MD;  Location: [facility] ORS;  Service: Cardiovascular;  Laterality: N/A;  . CARDIOVERSION N/A 08/07/2016   Procedure: CARDIOVERSION;  Surgeon: Wellington Hampshire, MD;  Location: [facility] ORS;  Service: Cardiovascular;  Laterality: N/A;  . CAROTID ENDARTERECTOMY     [facility]; Dr. Lucky Cowboy  . CHOLECYSTECTOMY    . LEG SURGERY     right;infection  . RIGHT/LEFT HEART CATH AND CORONARY ANGIOGRAPHY Bilateral 03/12/2017   Procedure: RIGHT/LEFT HEART CATH AND CORONARY ANGIOGRAPHY;  Surgeon: Wellington Hampshire, MD;  Location: [facility];  Service: Cardiovascular;  Laterality: Bilateral;  . TONSILLECTOMY      SOCIAL HISTORY:   Social History   Tobacco Use  . Smoking status: Former Smoker    Packs/day: 0.50    Years: 5.00    Pack years: 2.50    Types: Cigarettes  . Smokeless tobacco: Never Used  Substance Use Topics  . Alcohol use: No    FAMILY HISTORY:   Family History  Problem Relation Age of Onset  . Heart disease Mother  DRUG ALLERGIES:  No Known Allergies  REVIEW OF SYSTEMS:   Review of Systems  Constitutional: Positive for chills, fever and malaise/fatigue.  HENT: Negative for sore throat.   Eyes: Negative for blurred vision and double vision.  Respiratory: Negative for cough, hemoptysis, shortness of breath, wheezing and stridor.   Cardiovascular: Negative for chest pain, palpitations, orthopnea and leg swelling.  Gastrointestinal: Negative for abdominal pain, blood in stool, diarrhea, melena, nausea and vomiting.  Genitourinary: Negative for dysuria, flank pain and hematuria.  Musculoskeletal: Positive for back pain and joint pain.        Chronic back pain  Neurological: Negative for dizziness, sensory change, focal weakness, seizures, loss of consciousness, weakness and headaches.  Endo/Heme/Allergies: Negative for polydipsia.  Psychiatric/Behavioral: Negative for depression. The patient is not nervous/anxious.     MEDICATIONS AT HOME:   Prior to Admission medications   Medication Sig Start Date End Date Taking? Authorizing Provider  amiodarone (PACERONE) 200 MG tablet Take 0.5 tablets (100 mg total) by mouth daily. 01/03/17   Plonk, Gwyndolyn Saxon, MD  atorvastatin (LIPITOR) 10 MG tablet TAKE ONE TABLET BY MOUTH ONCE DAILY Patient taking differently: TAKE ONE TABLET BY MOUTH ONCE DAILY AT NOON 10/23/16   Wellington Hampshire, MD  [place] Extract [phone] PSORIASIS MEDICATED EX) Apply 1 application topically daily as needed (psoriasis).    [provider]  Ferrous Sulfate (IRON) 325 (65 Fe) MG TABS Take 1 tablet (325 mg total) by mouth daily. Patient taking differently: Take 5 tablets by mouth daily.  12/29/16   Plonk, Gwyndolyn Saxon, MD  furosemide (LASIX) 40 MG tablet Take 0.5 tablets (20 mg total) by mouth daily. 03/07/17   Wellington Hampshire, MD  glipiZIDE (GLUCOTROL) 10 MG tablet Take 5-15 mg by mouth 2 (two) times daily. Take 15 mg in the morning and 5 mg at night 08/13/15   [provider]  lisinopril (PRINIVIL,ZESTRIL) 20 MG tablet Take 1 tablet (20 mg total) by mouth daily. 03/16/17 06/14/17  Plonk, Gwyndolyn Saxon, MD  metoprolol succinate (TOPROL-XL) 100 MG 24 hr tablet Take 1 tablet (100 mg total) by mouth daily. Take with or immediately following a meal. Patient taking differently: Take 100 mg by mouth daily at 12 noon. Take with or immediately following a meal. 08/17/16   Wellington Hampshire, MD  spironolactone (ALDACTONE) 25 MG tablet TAKE 1 TABLET BY MOUTH ONCE DAILY 01/31/17   Wellington Hampshire, MD  warfarin (COUMADIN) 5 MG tablet TAKE 1 TABLET BY MOUTH ONCE DAILY OR AS DIRECT BY COUMADIN CLINIC Patient taking differently: TAKE 1  TABLET BY MOUTH ONCE DAILY AT NOON 11/24/16   Wellington Hampshire, MD      VITAL SIGNS:  Blood pressure 105/68, pulse (!) 53, temperature 99.6 F (37.6 C), temperature source Oral, resp. rate 19, SpO2 93 %.  PHYSICAL EXAMINATION:  Physical Exam  GENERAL:  66 y.o.-year-old patient lying in the bed with no acute distress.  EYES: Pupils equal, round, reactive to light and accommodation. No scleral icterus. Extraocular muscles intact.  HEENT: Head atraumatic, normocephalic. Oropharynx and nasopharynx clear.  NECK:  Supple, no jugular venous distention. No thyroid enlargement, no tenderness.  LUNGS: Normal breath sounds bilaterally, no wheezing, rales,rhonchi or crepitation. No use of accessory muscles of respiration.  CARDIOVASCULAR: S1, S2 normal. No murmurs, rubs, or gallops.  ABDOMEN: Soft, nontender, nondistended. Bowel sounds present. No organomegaly or mass.  EXTREMITIES: No pedal edema, cyanosis, or clubbing.  Left fIrst toe status post amputation.  There is erythema, swelling  and tenderness on the left second toe. NEUROLOGIC: Cranial nerves II through XII are intact. Muscle strength 5/5 in all extremities. Sensation intact. Gait not checked.  PSYCHIATRIC: The patient is alert and oriented x 3.  SKIN: No obvious rash, lesion, or ulcer.   LABORATORY PANEL:   CBC Recent Labs  Lab 03/30/17 1548  WBC 15.9*  HGB 11.4*  HCT 34.9*  PLT 169   ------------------------------------------------------------------------------------------------------------------  Chemistries  Recent Labs  Lab 03/30/17 1548  NA 134*  K 4.9  CL 101  CO2 24  GLUCOSE 187*  BUN 39*  CREATININE 1.47*  CALCIUM 9.2  AST 63*  ALT 61*  ALKPHOS 190*  BILITOT 1.6*   ------------------------------------------------------------------------------------------------------------------  Cardiac Enzymes No results for input(s): TROPONINI in the last 168  hours. ------------------------------------------------------------------------------------------------------------------  RADIOLOGY:  Dg Toe 2nd Left  Result Date: 03/30/2017 CLINICAL DATA:  Swelling, increased warmth and pink coloration of the left second toe. Previous amputation of the first toe. EXAM: LEFT SECOND TOE COMPARISON:  09/21/2016. FINDINGS: Interval amputation of the first toe. Moderate second DIP joint spur formation. A small linear calcific density areas again demonstrated lateral to the distal aspect of the second proximal phalanx, without interval discrete periosteal reaction. IMPRESSION: 1. No interval changes of acute osteomyelitis. 2. Second DIP joint degenerative changes. Electronically Signed   By: Claudie Revering M.D.   On: 03/30/2017 17:18      IMPRESSION AND PLAN:   Sepsis due to left second toe infection. The patient will be admitted to medical floor.  Sepsis protocol.  Continue vancomycin and Zosyn, follow-up cultures and CBC.  Podiatry consult.  Hyponatremia.  Normal saline IV in the follow-up BMP.  Diabetes.  Start sliding scale and hold glipizide.  History of chronic persistent A. fib.  Continue Coumadin pharmacy to dose. CKD stage III.  Stable Hypertension.  Continue hypertension medication.  All the records are reviewed and case discussed with ED provider. Management plans discussed with the patient, her daughter and they are in agreement.  CODE STATUS: DNR per patient.  TOTAL TIME TAKING CARE OF THIS PATIENT: 55 minutes.    Demetrios Loll M.D on 03/30/2017 at 7:07 PM  Between 7am to 6pm - Pager - [phone]  After 6pm go to [email or web address] - Proofreader  [facility]  Office  [phone]  CC: Primary care physician; Adline Potter, MD   Note: This dictation was prepared with Dragon dictation along with smaller phrase technology. Any transcriptional errors that result from this process are unin

## 2017-03-30 NOTE — Progress Notes (Addendum)
CODE SEPSIS - PHARMACY COMMUNICATION  **Broad Spectrum Antibiotics should be administered within 1 hour of Sepsis diagnosis**  Time Code Sepsis Called/Page Received: Code Sepsis page received at 1816; paging system currently not working properly   Antibiotics Ordered: Vancomycin and Zosyn   Time of 1st antibiotic administration: Zosyn @ 1646   Additional action taken by pharmacy: N/A   If necessary, Name of Provider/Nurse Contacted: N/A     Candelaria Stagers ,PharmD Clinical Pharmacist Resident  03/30/2017  6:13 PM

## 2017-03-30 NOTE — ED Notes (Signed)
Pt given warm blanket and repositioned in bed

## 2017-03-31 LAB — BASIC METABOLIC PANEL
ANION GAP: 6 (ref 5–15)
BUN: 45 mg/dL — ABNORMAL HIGH (ref 6–20)
CALCIUM: 8.5 mg/dL — AB (ref 8.9–10.3)
CO2: 24 mmol/L (ref 22–32)
CREATININE: 1.81 mg/dL — AB (ref 0.44–1.00)
Chloride: 104 mmol/L (ref 101–111)
GFR calc Af Amer: 32 mL/min — ABNORMAL LOW (ref >60)
GFR, EST NON AFRICAN AMERICAN: 28 mL/min — AB (ref >60)
GLUCOSE: 119 mg/dL — AB (ref 65–99)
Potassium: 4.7 mmol/L (ref 3.5–5.1)
Sodium: 134 mmol/L — ABNORMAL LOW (ref 135–145)

## 2017-03-31 LAB — GLUCOSE, CAPILLARY
GLUCOSE-CAPILLARY: 202 mg/dL — AB (ref 65–99)
Glucose-Capillary: 112 mg/dL — ABNORMAL HIGH (ref 65–99)
Glucose-Capillary: 187 mg/dL — ABNORMAL HIGH (ref 65–99)
Glucose-Capillary: 140 mg/dL — ABNORMAL HIGH (ref 65–99)

## 2017-03-31 LAB — CBC
HCT: 32.5 % — ABNORMAL LOW (ref 35.0–47.0)
HEMOGLOBIN: 10.5 g/dL — AB (ref 12.0–16.0)
MCH: 28.2 pg (ref 26.0–34.0)
MCHC: 32.2 g/dL (ref 32.0–36.0)
MCV: 87.8 fL (ref 80.0–100.0)
PLATELETS: 153 10*3/uL (ref 150–440)
RBC: 3.7 MIL/uL — ABNORMAL LOW (ref 3.80–5.20)
RDW: 23.4 % — AB (ref 11.5–14.5)
WBC: 11.5 10*3/uL — ABNORMAL HIGH (ref 3.6–11.0)

## 2017-03-31 LAB — PROTIME-INR
INR: 1.77
PROTHROMBIN TIME: 20.5 s — AB (ref 11.4–15.2)

## 2017-03-31 MED ORDER — WARFARIN SODIUM 7.5 MG PO TABS
7.5000 mg | ORAL_TABLET | Freq: Once | ORAL | Status: AC
  Administered 2017-03-31: 7.5 mg via ORAL
  Filled 2017-03-31: qty 1

## 2017-03-31 MED ORDER — WARFARIN - PHARMACIST DOSING INPATIENT: Freq: Every day | Status: DC

## 2017-03-31 NOTE — Progress Notes (Signed)
[place] at [facility]                                                                                                                                                                                  Patient Demographics   Meghan [name], is a 66 y.o. female, DOB - [birth year], FYB:[id number]  Admit date - 03/30/2017   Admitting Physician Demetrios Loll, MD  Outpatient Primary MD for the patient is Adline Potter, MD   LOS - 1  Subjective:  Patient admitted with right toe infection was seen by podiatry they did incision and drainage   Review of Systems:   CONSTITUTIONAL: No documented fever. No fatigue, weakness. No weight gain, no weight loss.  EYES: No blurry or double vision.  ENT: No tinnitus. No postnasal drip. No redness of the oropharynx.  RESPIRATORY: No cough, no wheeze, no hemoptysis. No dyspnea.  CARDIOVASCULAR: No chest pain. No orthopnea. No palpitations. No syncope.  GASTROINTESTINAL: No nausea, no vomiting or diarrhea. No abdominal pain. No melena or hematochezia.  GENITOURINARY: No dysuria or hematuria.  ENDOCRINE: No polyuria or nocturia. No heat or cold intolerance.  HEMATOLOGY: No anemia. No bruising. No bleeding.  INTEGUMENTARY: No rashes. No lesions.  MUSCULOSKELETAL: No arthritis. No swelling. No gout.   NEUROLOGIC: No numbness, tingling, or ataxia. No seizure-type activity.  PSYCHIATRIC: No anxiety. No insomnia. No ADD.    Vitals:   Vitals:   03/31/17 0735 03/31/17 0800 03/31/17 0954 03/31/17 1344  BP: (!) 101/44 101/70 101/70 (!) 105/40  Pulse: 64 64  65  Resp:      Temp: 98.9 F (37.2 C) 98.3 F (36.8 C)    TempSrc: Oral Oral    SpO2: (!) 88% (!) 89%    Weight:      Height:        Wt Readings from Last 3 Encounters:  03/30/17 217 lb 6.4 oz (98.6 kg)  03/28/17 214 lb 8 oz (97.3 kg)  03/12/17 219 lb (99.3 kg)     Intake/Output Summary (Last 24 hours) at 03/31/2017 1429 Last data filed at 03/31/2017 1300 Gross per 24  hour  Intake 790 ml  Output -  Net 790 ml    Physical Exam:   GENERAL: Pleasant-appearing in no apparent distress.  HEAD, EYES, EARS, NOSE AND THROAT: Atraumatic, normocephalic. Extraocular muscles are intact. Pupils equal and reactive to light. Sclerae anicteric. No conjunctival injection. No oro-pharyngeal erythema.  NECK: Supple. There is no jugular venous distention. No bruits, no lymphadenopathy, no thyromegaly.  HEART: Regular rate and rhythm,. No murmurs, no rubs, no clicks.  LUNGS: Clear to auscultation bilaterally. No rales or rhonchi. No wheezes.  ABDOMEN:  Soft, flat, nontender, nondistended. Has good bowel sounds. No hepatosplenomegaly appreciated.  EXTREMITIES: No evidence of any cyanosis, clubbing, or peripheral edema.  +2 pedal and radial pulses bilaterally.  NEUROLOGIC: The patient is alert, awake, and oriented x3 with no focal motor or sensory deficits appreciated bilaterally.  SKIN: Right foot dressing in place Psych: Not anxious, depressed LN: No inguinal LN enlargement    Antibiotics   Anti-infectives (From admission, onward)   Start     Dose/Rate Route Frequency Ordered Stop   03/31/17 0000  piperacillin-tazobactam (ZOSYN) IVPB 3.375 g     3.375 g 12.5 mL/hr over 240 Minutes Intravenous Every 8 hours 03/30/17 1808     03/31/17 0000  vancomycin (VANCOCIN) 1,250 mg in sodium chloride 0.9 % 250 mL IVPB     1,250 mg 166.7 mL/hr over 90 Minutes Intravenous Every 24 hours 03/30/17 1812     03/30/17 1645  piperacillin-tazobactam (ZOSYN) IVPB 3.375 g     3.375 g 100 mL/hr over 30 Minutes Intravenous  Once 03/30/17 1637 03/30/17 1716   03/30/17 1645  vancomycin (VANCOCIN) IVPB 1000 mg/200 mL premix     1,000 mg 200 mL/hr over 60 Minutes Intravenous  Once 03/30/17 1637 03/30/17 1848      Medications   Scheduled Meds: . amiodarone  100 mg Oral Daily  . atorvastatin  10 mg Oral Daily  . ferrous sulfate  325 mg Oral Daily  . furosemide  20 mg Oral Daily  .  insulin aspart  0-5 Units Subcutaneous QHS  . insulin aspart  0-9 Units Subcutaneous TID WC  . lisinopril  20 mg Oral Daily  . metoprolol succinate  100 mg Oral Q1200  . sodium chloride flush  3 mL Intravenous Q12H  . spironolactone  25 mg Oral Daily  . warfarin  7.5 mg Oral ONCE-1800  . Warfarin - Pharmacist Dosing Inpatient   Does not apply q1800   Continuous Infusions: . sodium chloride    . piperacillin-tazobactam (ZOSYN)  IV 3.375 g (03/31/17 1112)  . vancomycin Stopped (03/31/17 0209)   PRN Meds:.sodium chloride, acetaminophen **OR** acetaminophen, albuterol, bisacodyl, HYDROcodone-acetaminophen, ketorolac, ondansetron **OR** ondansetron (ZOFRAN) IV, senna-docusate, sodium chloride flush   Data Review:   Micro Results Recent Results (from the past 240 hour(s))  Blood culture (routine x 2)     Status: None (Preliminary result)   Collection Time: 03/30/17  4:18 PM  Result Value Ref Range Status   Specimen Description BLOOD RIGHT ANTECUBITAL  Final   Special Requests   Final    BOTTLES DRAWN AEROBIC AND ANAEROBIC Blood Culture adequate volume   Culture   Final    NO GROWTH < 24 HOURS Performed at [facility], [address] [place], [place] [address]    Report Status PENDING  Incomplete  Blood culture (routine x 2)     Status: None (Preliminary result)   Collection Time: 03/30/17  4:27 PM  Result Value Ref Range Status   Specimen Description BLOOD LEFT ANTECUBITAL  Final   Special Requests   Final    BOTTLES DRAWN AEROBIC AND ANAEROBIC Blood Culture adequate volume   Culture   Final    NO GROWTH < 24 HOURS Performed at [facility], [address] [place], [place] [address]    Report Status PENDING  Incomplete    Radiology Reports Dg Toe 2nd Left  Result Date: 03/30/2017 CLINICAL DATA:  Swelling, increased warmth and pink coloration of the left second toe. Previous amputation of the first  toe. EXAM: LEFT SECOND TOE COMPARISON:  09/21/2016.  FINDINGS: Interval amputation of the first toe. Moderate second DIP joint spur formation. A small linear calcific density areas again demonstrated lateral to the distal aspect of the second proximal phalanx, without interval discrete periosteal reaction. IMPRESSION: 1. No interval changes of acute osteomyelitis. 2. Second DIP joint degenerative changes. Electronically Signed   By: Claudie Revering M.D.   On: 03/30/2017 17:18     CBC Recent Labs  Lab 03/30/17 1548 03/31/17 0314  WBC 15.9* 11.5*  HGB 11.4* 10.5*  HCT 34.9* 32.5*  PLT 169 153  MCV 86.6 87.8  MCH 28.2 28.2  MCHC 32.6 32.2  RDW 23.3* 23.4*  LYMPHSABS 0.5*  --   MONOABS 1.4*  --   EOSABS 0.3  --   BASOSABS 0.1  --     Chemistries  Recent Labs  Lab 03/28/17 1516 03/30/17 1548 03/31/17 0314  NA 138 134* 134*  K 4.8 4.9 4.7  CL 104 101 104  CO2 21 24 24   GLUCOSE 99 187* 119*  BUN 38* 39* 45*  CREATININE 1.48* 1.47* 1.81*  CALCIUM 9.5 9.2 8.5*  AST  --  63*  --   ALT  --  61*  --   ALKPHOS  --  190*  --   BILITOT  --  1.6*  --    ------------------------------------------------------------------------------------------------------------------ estimated creatinine clearance is 35.9 mL/min (A) (by C-G formula based on SCr of 1.81 mg/dL (H)). ------------------------------------------------------------------------------------------------------------------ Recent Labs    03/30/17 1548  HGBA1C 8.4*   ------------------------------------------------------------------------------------------------------------------ No results for input(s): CHOL, HDL, LDLCALC, TRIG, CHOLHDL, LDLDIRECT in the last 72 hours. ------------------------------------------------------------------------------------------------------------------ No results for input(s): TSH, T4TOTAL, T3FREE, THYROIDAB in the last 72 hours.  Invalid input(s):  FREET3 ------------------------------------------------------------------------------------------------------------------ No results for input(s): VITAMINB12, FOLATE, FERRITIN, TIBC, IRON, RETICCTPCT in the last 72 hours.  Coagulation profile Recent Labs  Lab 03/28/17 1350 03/30/17 1619 03/31/17 0314  INR 1.3 1.59 1.77    No results for input(s): DDIMER in the last 72 hours.  Cardiac Enzymes No results for input(s): CKMB, TROPONINI, MYOGLOBIN in the last 168 hours.  Invalid input(s): CK ------------------------------------------------------------------------------------------------------------------ Invalid input(s): POCBNP    Assessment & Plan  Pt is 66 y.o  With DM presents with sepsis with left second toe infection   1. Sepsis due to left second toe infection. Continue IV antibiotics  Per podiatry can be discharged home tomorrow on oral antibiotic  2. Hyponatremia.    Improved with IV hydration  3. Diabetes.    Continue sliding scale  4. History of chronic persistent A. fib.  Continue Coumadin pharmacy to dose.  5. CKD stage III. Stable  6. Hypertension.  Continue hypertension medication       Code Status Orders  (From admission, onward)        Start     Ordered   03/30/17 2003  Do not attempt resuscitation (DNR)  Continuous    Question Answer Comment  In the event of cardiac or respiratory ARREST Do not call a "code blue"   In the event of cardiac or respiratory ARREST Do not perform Intubation, CPR, defibrillation or ACLS   In the event of cardiac or respiratory ARREST Use medication by any route, position, wound care, and other measures to relive pain and suffering. May use oxygen, suction and manual treatment of airway obstruction as needed for comfort.      03/30/17 2002    Code Status History    Date Active Date Inactive  Code Status Order ID Comments User Context   03/12/2017 12:36 03/12/2017 17:20 Full Code [id number]  Wellington Hampshire, MD  Inpatient   09/21/2016 20:56 09/24/2016 19:22 Full Code [id number]  Theodoro Grist, MD Inpatient   07/26/2016 15:30 07/28/2016 18:12 Full Code [id number]  Theodoro Grist, MD Inpatient           Consults Troxler MD  DVT Prophylaxis  Lovenox   Lab Results  Component Value Date   PLT 153 03/31/2017     Time Spent in minutes  2min  Greater than 50% of time spent in care coordination and counseling patient regarding the condition and plan of care.   Dustin Flock M.D on 03/31/2017 at 2:29 PM  Between 7am to 6pm - Pager - [phone]  After 6pm go to [email or web address] - password [email or web address] [facility]   Office  [phone]

## 2017-03-31 NOTE — Consult Note (Signed)
Patient Demographics  Meghan [name], is a 66 y.o. female   MRN: [id number]   DOB - 06-12-1950  Admit Date - 03/30/2017    Outpatient Primary MD for the patient is Adline Potter, MD  Consult requested in the Hospital by Dustin Flock, MD, On 03/31/2017    Reason for consult diabetic ulceration and cellulitis to the left second toe   With History of -  Past Medical History:  Diagnosis Date  . Carotid arterial disease ([place])    a. 02/2012 s/p L CEA.  . Coronary artery disease    a. 02/2012 Lexi MV: Anterior, anteroseptal, septal, apical infarct with mild peri-infarct ischemia, EF 41%- medically managed due to anemia; b. 03/2017 Cath: LM nl, LAD 99ost/p/m - faint L->L collaterals, RI 20ost, LCX 107m, RCA 20p, 53m-->Med Rx for chronic subtotal LAD dzs. Avoid R Radial access in future.  . 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 Hyperlipidemia   . Hypertension   . Ischemic cardiomyopathy    a. 06/2013 Echo: EF 35-40%, glob HK, mildly dil LA, mild LVH, mild TR, mild to mod MR; b. 07/2016 Echo: EF 25-30%, sev mid-apicalanteroseptal, ant, apical HK, mild MR, sev dil LA, mod dil RA, mod TR, PASP 38mmHg; c. 11/2016 Echo: EF 20-25%, antsept DK, Gr2 DD, mild MR, mod dil LA/RV/RA, mod red RV fxn, mod-sev TR, PASP 40-55mmHg.  . Morbid obesity ([place])   . Normocytic anemia   . Persistent atrial fibrillation (HCC)    a. CHA2DS2VASc = 7-->coumadin; maintaining sinus on amio.  . Stroke [facility]) 2013      Past Surgical History:  Procedure Laterality Date  . AMPUTATION TOE Left 09/22/2016   Procedure: AMPUTATION TOE;  Surgeon: Albertine Patricia, DPM;  Location: [facility] ORS;  Service: Podiatry;  Laterality: Left;  . CARDIOVERSION N/A 07/28/2016   Procedure: CARDIOVERSION;  Surgeon: Wellington Hampshire, MD;  Location: [facility]  ORS;  Service: Cardiovascular;  Laterality: N/A;  . CARDIOVERSION N/A 08/07/2016   Procedure: CARDIOVERSION;  Surgeon: Wellington Hampshire, MD;  Location: [facility] ORS;  Service: Cardiovascular;  Laterality: N/A;  . CAROTID ENDARTERECTOMY     [facility]; Dr. Lucky Cowboy  . CHOLECYSTECTOMY    . LEG SURGERY     right;infection  . RIGHT/LEFT HEART CATH AND CORONARY ANGIOGRAPHY Bilateral 03/12/2017   Procedure: RIGHT/LEFT HEART CATH AND CORONARY ANGIOGRAPHY;  Surgeon: Wellington Hampshire, MD;  Location: [place] CV LAB;  Service: Cardiovascular;  Laterality: Bilateral;  . TONSILLECTOMY      in for   Chief Complaint  Patient presents with  . Wound Infection    Diabetic Wound     HPI  Meghan [name]  is a 66 y.o. female, patient is an established patient of mine she had left great toe amputation by me about 6 months ago when she done well with.  The right second toe has developed some contracture and she was on her feet quite a bit over the last few days and developed a sore on the tip of the toe and secondary cellulitis.  She came to the emergency room due to pain redness and swelling to the area.  She has been on IV antibiotics  since last evening and she is considerably better today.  White blood count is come down from 15-11    Review of Systems    In addition to the HPI above,  No Fever-chills, No Headache, No changes with Vision or hearing, No problems swallowing food or Liquids, No Chest pain, Cough or Shortness of Breath, No Abdominal pain, No Nausea or Vommitting, Bowel movements are regular, No Blood in stool or Urine, No dysuria, No new skin rashes or bruises, No new joints pains-aches,  No new weakness, tingling, numbness in any extremity, No recent weight gain or loss, No polyuria, polydypsia or polyphagia, No significant Mental Stressors.  A full 10 point Review of Systems was done, except as stated above, all other Review of Systems were negative.   Social History Social History    Tobacco Use  . Smoking status: Former Smoker    Packs/day: 0.50    Years: 5.00    Pack years: 2.50    Types: Cigarettes  . Smokeless tobacco: Never Used  Substance Use Topics  . Alcohol use: No  Family History Family History  Problem Relation Age of Onset  . Heart disease Mother     Prior to Admission medications   Medication Sig Start Date End Date Taking? Authorizing Provider  amiodarone (PACERONE) 200 MG tablet Take 0.5 tablets (100 mg total) by mouth daily. 01/03/17  Yes Plonk, Gwyndolyn Saxon, MD  atorvastatin (LIPITOR) 10 MG tablet TAKE ONE TABLET BY MOUTH ONCE DAILY Patient taking differently: TAKE ONE TABLET BY MOUTH ONCE DAILY AT NOON 10/23/16  Yes Wellington Hampshire, MD  Ferrous Sulfate (IRON) 325 (65 Fe) MG TABS Take 1 tablet (325 mg total) by mouth daily. Patient taking differently: Take 5 tablets by mouth daily.  12/29/16  Yes Plonk, Gwyndolyn Saxon, MD  furosemide (LASIX) 40 MG tablet Take 0.5 tablets (20 mg total) by mouth daily. 03/07/17  Yes Wellington Hampshire, MD  glipiZIDE (GLUCOTROL) 10 MG tablet Take 10 mg by mouth 2 (two) times daily.  08/13/15  Yes [provider]  lisinopril (PRINIVIL,ZESTRIL) 20 MG tablet Take 1 tablet (20 mg total) by mouth daily. 03/16/17 06/14/17 Yes Plonk, Gwyndolyn Saxon, MD  metoprolol succinate (TOPROL-XL) 100 MG 24 hr tablet Take 1 tablet (100 mg total) by mouth daily. Take with or immediately following a meal. Patient taking differently: Take 100 mg by mouth daily at 12 noon. Take with or immediately following a meal. 08/17/16  Yes Wellington Hampshire, MD  spironolactone (ALDACTONE) 25 MG tablet TAKE 1 TABLET BY MOUTH ONCE DAILY 01/31/17  Yes Wellington Hampshire, MD  warfarin (COUMADIN) 5 MG tablet TAKE 1 TABLET BY MOUTH ONCE DAILY OR AS DIRECT BY COUMADIN CLINIC Patient taking differently: Take 5mg  by mouth 5 days per week and 7.5mg  2 days per week 11/24/16  Yes Wellington Hampshire, MD  [employer] Extract [phone] PSORIASIS MEDICATED EX) Apply 1 application topically  daily as needed (psoriasis).    [provider]    Anti-infectives (From admission, onward)   Start     Dose/Rate Route Frequency Ordered Stop   03/31/17 0000  piperacillin-tazobactam (ZOSYN) IVPB 3.375 g     3.375 g 12.5 mL/hr over 240 Minutes Intravenous Every 8 hours 03/30/17 1808     03/31/17 0000  vancomycin (VANCOCIN) 1,250 mg in sodium chloride 0.9 % 250 mL IVPB     1,250 mg 166.7 mL/hr over 90 Minutes Intravenous Every 24 hours 03/30/17 1812     03/30/17 1645  piperacillin-tazobactam (ZOSYN)  IVPB 3.375 g     3.375 g 100 mL/hr over 30 Minutes Intravenous  Once 03/30/17 1637 03/30/17 1716   03/30/17 1645  vancomycin (VANCOCIN) IVPB 1000 mg/200 mL premix     1,000 mg 200 mL/hr over 60 Minutes Intravenous  Once 03/30/17 1637 03/30/17 1848      Scheduled Meds: . amiodarone  100 mg Oral Daily  . atorvastatin  10 mg Oral Daily  . ferrous sulfate  325 mg Oral Daily  . furosemide  20 mg Oral Daily  . insulin aspart  0-5 Units Subcutaneous QHS  . insulin aspart  0-9 Units Subcutaneous TID WC  . lisinopril  20 mg Oral Daily  . metoprolol succinate  100 mg Oral Q1200  . sodium chloride flush  3 mL Intravenous Q12H  . spironolactone  25 mg Oral Daily  . warfarin  7.5 mg Oral ONCE-1800  . Warfarin - Pharmacist Dosing Inpatient   Does not apply q1800   Continuous Infusions: . sodium chloride    . piperacillin-tazobactam (ZOSYN)  IV Stopped (03/31/17 0439)  . vancomycin Stopped (03/31/17 0209)   PRN Meds:.sodium chloride, acetaminophen **OR** acetaminophen, albuterol, bisacodyl, HYDROcodone-acetaminophen, ketorolac, ondansetron **OR** ondansetron (ZOFRAN) IV, senna-docusate, sodium chloride flush  No Known Allergies  Physical Exam  Vitals  Blood pressure 101/70, pulse 64, temperature 98.3 F (36.8 C), temperature source Oral, resp. rate 19, height 5\' 7"  (1.702 m), weight 98.6 kg (217 lb 6.4 oz), SpO2 (!) 89 %.  Lower Extremity exam:  Vascular: Diminished  bilaterally but palpable  Dermatological: Patient has a wound on the distal tip of the left second toe it appears to involve skin and skin structures primarily maybe a slight breach into the subcutaneous fat.  Some cellulitis is noted to the toe but there is no purulent drainage from the distal portion of the toe at all.  Wound itself is about 4 mm in width 3 mm in length and 3 mm depth  Neurological: The patient does have significant diabetic peripheral neuropathy.  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  Contracture of the second toe with subsequent increased pressure on the tip of the second toe.  We have ordered her from the office diabetic shoes along with molded inserts with an amputation filler for the loss of the great toe.  We finally get those approved recently and we are waiting for those to come in.  Data Review  CBC Recent Labs  Lab 03/30/17 1548 03/31/17 0314  WBC 15.9* 11.5*  HGB 11.4* 10.5*  HCT 34.9* 32.5*  PLT 169 153  MCV 86.6 87.8  MCH 28.2 28.2  MCHC 32.6 32.2  RDW 23.3* 23.4*  LYMPHSABS 0.5*  --   MONOABS 1.4*  --   EOSABS 0.3  --   BASOSABS 0.1  --    ------------------------------------------------------------------------------------------------------------------  Chemistries  Recent Labs  Lab 03/28/17 1516 03/30/17 1548 03/31/17 0314  NA 138 134* 134*  K 4.8 4.9 4.7  CL 104 101 104  CO2 21 24 24   GLUCOSE 99 187* 119*  BUN 38* 39* 45*  CREATININE 1.48* 1.47* 1.81*  CALCIUM 9.5 9.2 8.5*  AST  --  63*  --   ALT  --  61*  --   ALKPHOS  --  190*  --   BILITOT  --  1.6*  --    ------------------------------------------------------------------------------------------------------------------ estimated creatinine clearance is 35.9 mL/min (A) (by C-G formula based on SCr of 1.81 mg/dL (H)). ------------------------------------------------------------------------------------------------------------------ No results  for  input(s): TSH, T4TOTAL, T3FREE, THYROIDAB in the last 72 hours.  Invalid input(s): FREET3   Coagulation profile Recent Labs  Lab 03/28/17 1350 03/30/17 1619 03/31/17 0314  INR 1.3 1.59 1.77   ------------------------------------------------------------------------------------------------------------------- No results for input(s): DDIMER in the last 72 hours. -------------------------------------------------------------------------------------------------------------------  Cardiac Enzymes No results for input(s): CKMB, TROPONINI, MYOGLOBIN in the last 168 hours.  Invalid input(s): CK ------------------------------------------------------------------------------------------------------------------ Invalid input(s): POCBNP   ---------------------------------------------------------------------------------------------------------------  Urinalysis    Component Value Date/Time   COLORURINE Yellow 06/29/2013 0318   APPEARANCEUR Cloudy 06/29/2013 0318   LABSPEC 1.030 06/29/2013 0318   PHURINE 5.0 06/29/2013 0318   GLUCOSEU >=500 06/29/2013 0318   HGBUR 2+ 06/29/2013 0318   BILIRUBINUR Negative 06/29/2013 0318   KETONESUR 1+ 06/29/2013 0318   PROTEINUR 30 mg/dL 06/29/2013 0318   NITRITE Negative 06/29/2013 0318   LEUKOCYTESUR 3+ 06/29/2013 0318     Imaging results:   Dg Toe 2nd Left  Result Date: 03/30/2017 CLINICAL DATA:  Swelling, increased warmth and pink coloration of the left second toe. Previous amputation of the first toe. EXAM: LEFT SECOND TOE COMPARISON:  09/21/2016. FINDINGS: Interval amputation of the first toe. Moderate second DIP joint spur formation. A small linear calcific density areas again demonstrated lateral to the distal aspect of the second proximal phalanx, without interval discrete periosteal reaction. IMPRESSION: 1. No interval changes of acute osteomyelitis. 2. Second DIP joint degenerative changes. Electronically Signed   By: Claudie Revering M.D.    On: 03/30/2017 17:18   Assessment & Plan: Overall I think the toe is fairly stable.  The ulceration does not seem to penetrate down to bone.  X-rays reviewed by me and I do not see any evidence that there is osteomyelitis to the region.  No purulent drainage from the wound and cellulitis is much improved.  I think that if we keep her on IV antibiotics today we should be able to send her home tomorrow on oral antibiotics.  I did an excisional debridement of the wound today with a 15 scalpel blade to remove necrotic tissue from the distal tip of the toe.  Also took a aerobic and anaerobic culture to the area.  I will follow her again tomorrow and see if she is okay for discharge.  Active Problems:   Sepsis [facility])  Family Communication: Plan discussed with patient and family   Thank you for the consult, we will follow the patient with you in the Hospital.   Perry Mount M.D on 03/31/2017 at 9:56 AM

## 2017-03-31 NOTE — Progress Notes (Signed)
ANTICOAGULATION CONSULT NOTE - Initial Consult  Pharmacy Consult for warfarin Indication: atrial fibrillation  No Known Allergies  Patient Measurements: Height: 5\' 7"  (170.2 cm) Weight: 217 lb 6.4 oz (98.6 kg) IBW/kg (Calculated) : 61.6  Vital Signs: Temp: 98.9 F (37.2 C) (12/22 0735) Temp Source: Oral (12/22 0735) BP: 101/44 (12/22 0735) Pulse Rate: 64 (12/22 0735)  Labs: Recent Labs    03/28/17 1350 03/28/17 1516 03/30/17 1548 03/30/17 1619 03/31/17 0314  HGB  --   --  11.4*  --  10.5*  HCT  --   --  34.9*  --  32.5*  PLT  --   --  169  --  153  LABPROT  --   --   --  18.8* 20.5*  INR 1.3  --   --  1.59 1.77  CREATININE  --  1.48* 1.47*  --  1.81*    Estimated Creatinine Clearance: 35.9 mL/min (A) (by C-G formula based on SCr of 1.81 mg/dL (H)).   Medical History: Past Medical History:  Diagnosis Date  . Carotid arterial disease ([place])    a. 02/2012 s/p L CEA.  . Coronary artery disease    a. 02/2012 Lexi MV: Anterior, anteroseptal, septal, apical infarct with mild peri-infarct ischemia, EF 41%- medically managed due to anemia; b. 03/2017 Cath: LM nl, LAD 99ost/p/m - faint L->L collaterals, RI 20ost, LCX 13m, RCA 20p, 52m-->Med Rx for chronic subtotal LAD dzs. Avoid R Radial access in future.  . 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 Hyperlipidemia   . Hypertension   . Ischemic cardiomyopathy    a. 06/2013 Echo: EF 35-40%, glob HK, mildly dil LA, mild LVH, mild TR, mild to mod MR; b. 07/2016 Echo: EF 25-30%, sev mid-apicalanteroseptal, ant, apical HK, mild MR, sev dil LA, mod dil RA, mod TR, PASP 46mmHg; c. 11/2016 Echo: EF 20-25%, antsept DK, Gr2 DD, mild MR, mod dil LA/RV/RA, mod red RV fxn, mod-sev TR, PASP 40-6mmHg.  . Morbid obesity ([place])   . Normocytic anemia   . Persistent atrial fibrillation (HCC)    a. CHA2DS2VASc = 7-->coumadin; maintaining sinus on amio.  . Stroke [facility]) 2013     Assessment: 66 yo female on warfarin PTA. Med  list states she takes 5 mg 5 days per week and 7.5 mg two days per week. Med rec states she took dose this AM.                                  INR              Warfarin  12/21:                  1.59                 Already taken 12/22                  1.77                     Goal of Therapy:  INR 2-3 Monitor platelets by anticoagulation protocol: Yes   Plan:  Will give warfarin 7.5 mg PO x 1 dose today.   Taesha Goodell D 03/31/2017,8:20 AM

## 2017-04-01 LAB — CBC
HCT: 31.6 % — ABNORMAL LOW (ref 35.0–47.0)
Hemoglobin: 10.5 g/dL — ABNORMAL LOW (ref 12.0–16.0)
MCH: 29 pg (ref 26.0–34.0)
MCHC: 33.3 g/dL (ref 32.0–36.0)
MCV: 87.2 fL (ref 80.0–100.0)
PLATELETS: 155 10*3/uL (ref 150–440)
RBC: 3.63 MIL/uL — AB (ref 3.80–5.20)
RDW: 23.2 % — ABNORMAL HIGH (ref 11.5–14.5)
WBC: 6.4 10*3/uL (ref 3.6–11.0)

## 2017-04-01 LAB — GLUCOSE, CAPILLARY
Glucose-Capillary: 88 mg/dL (ref 65–99)
Glucose-Capillary: 183 mg/dL — ABNORMAL HIGH (ref 65–99)

## 2017-04-01 LAB — PROTIME-INR
INR: 1.75
Prothrombin Time: 20.3 seconds — ABNORMAL HIGH (ref 11.4–15.2)

## 2017-04-01 MED ORDER — WARFARIN SODIUM 10 MG PO TABS
10.0000 mg | ORAL_TABLET | Freq: Once | ORAL | Status: DC
  Filled 2017-04-01: qty 1

## 2017-04-01 MED ORDER — AMOXICILLIN-POT CLAVULANATE 875-125 MG PO TABS: 1.0000 | ORAL_TABLET | Freq: Two times a day (BID) | ORAL | 0 refills | Status: AC

## 2017-04-01 NOTE — Progress Notes (Signed)
Discharge instructions given and went over with patient and patients daughter at bedside. Prescription reviewed. All questions answered. Postop care instructions provided and shoe given as ordered. Patient discharged home via wheelchair by volunteer services. Madlyn Frankel, RN

## 2017-04-01 NOTE — Progress Notes (Signed)
Patient Demographics  Meghan [name], is a 66 y.o. female   MRN: [id number]   DOB - 28-Oct-1950  Admit Date - 03/30/2017    Outpatient Primary MD for the patient is Adline Potter, MD  Consult requested in the Hospital by Dustin Flock, MD, On 04/01/2017  With History of -  Past Medical History:  Diagnosis Date  . Carotid arterial disease ([place])    a. 02/2012 s/p L CEA.  . Coronary artery disease    a. 02/2012 Lexi MV: Anterior, anteroseptal, septal, apical infarct with mild peri-infarct ischemia, EF 41%- medically managed due to anemia; b. 03/2017 Cath: LM nl, LAD 99ost/p/m - faint L->L collaterals, RI 20ost, LCX 71m, RCA 20p, 45m-->Med Rx for chronic subtotal LAD dzs. Avoid R Radial access in future.  . 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 Hyperlipidemia   . Hypertension   . Ischemic cardiomyopathy    a. 06/2013 Echo: EF 35-40%, glob HK, mildly dil LA, mild LVH, mild TR, mild to mod MR; b. 07/2016 Echo: EF 25-30%, sev mid-apicalanteroseptal, ant, apical HK, mild MR, sev dil LA, mod dil RA, mod TR, PASP 62mmHg; c. 11/2016 Echo: EF 20-25%, antsept DK, Gr2 DD, mild MR, mod dil LA/RV/RA, mod red RV fxn, mod-sev TR, PASP 40-58mmHg.  . Morbid obesity ([place])   . Normocytic anemia   . Persistent atrial fibrillation (HCC)    a. CHA2DS2VASc = 7-->coumadin; maintaining sinus on amio.  . Stroke [facility] 2013      Past Surgical History:  Procedure Laterality Date  . AMPUTATION TOE Left 09/22/2016   Procedure: AMPUTATION TOE;  Surgeon: Albertine Patricia, DPM;  Location: [facility] ORS;  Service: Podiatry;  Laterality: Left;  . CARDIOVERSION N/A 07/28/2016   Procedure: CARDIOVERSION;  Surgeon: Wellington Hampshire, MD;  Location: [facility] ORS;  Service: Cardiovascular;  Laterality: N/A;  . CARDIOVERSION N/A 08/07/2016   Procedure: CARDIOVERSION;  Surgeon: Wellington Hampshire, MD;  Location: [facility] ORS;  Service: Cardiovascular;   Laterality: N/A;  . CAROTID ENDARTERECTOMY     [facility]; Dr. Lucky Cowboy  . CHOLECYSTECTOMY    . LEG SURGERY     right;infection  . RIGHT/LEFT HEART CATH AND CORONARY ANGIOGRAPHY Bilateral 03/12/2017   Procedure: RIGHT/LEFT HEART CATH AND CORONARY ANGIOGRAPHY;  Surgeon: Wellington Hampshire, MD;  Location: [place] [facility];  Service: Cardiovascular;  Laterality: Bilateral;  . TONSILLECTOMY      in for   Chief Complaint  Patient presents with  . Wound Infection    Diabetic Wound     HPI  Meghan [name]  is a 66 y.o. female, patient admitted for increasing fever temperature had an open sore cellulitis to her second toe of her left foot.  And IV antibiotics for 2 days.    Review of Systems: Patient is alert well oriented no apparent distress.  In addition to the HPI above,  No Fever-chills, No Headache, No changes with Vision or hearing, No problems swallowing food or Liquids, No Chest pain, Cough or Shortness of Breath, No Abdominal pain, No Nausea or Vommitting, Bowel movements are regular, No Blood in stool or Urine, No dysuria, No new skin rashes or bruises, No new joints  pains-aches,  No new weakness, tingling, numbness in any extremity, No recent weight gain or loss, No polyuria, polydypsia or polyphagia, No significant Mental Stressors.  A full 10 point Review of Systems was done, except as stated above, all other Review of Systems were negative.   Social History Social History   Tobacco Use  . Smoking status: Former Smoker    Packs/day: 0.50    Years: 5.00    Pack years: 2.50    Types: Cigarettes  . Smokeless tobacco: Never Used  Substance Use Topics  . Alcohol use: No  Family History Family History  Problem Relation Age of Onset  . Heart disease Mother     Prior to Admission medications   Medication Sig Start Date End Date Taking? Authorizing Provider  amiodarone (PACERONE) 200 MG tablet Take 0.5 tablets (100 mg total) by mouth daily. 01/03/17  Yes Plonk,  Gwyndolyn Saxon, MD  atorvastatin (LIPITOR) 10 MG tablet TAKE ONE TABLET BY MOUTH ONCE DAILY Patient taking differently: TAKE ONE TABLET BY MOUTH ONCE DAILY AT NOON 10/23/16  Yes Wellington Hampshire, MD  Ferrous Sulfate (IRON) 325 (65 Fe) MG TABS Take 1 tablet (325 mg total) by mouth daily. Patient taking differently: Take 5 tablets by mouth daily.  12/29/16  Yes Plonk, Gwyndolyn Saxon, MD  furosemide (LASIX) 40 MG tablet Take 0.5 tablets (20 mg total) by mouth daily. 03/07/17  Yes Wellington Hampshire, MD  glipiZIDE (GLUCOTROL) 10 MG tablet Take 10 mg by mouth 2 (two) times daily.  08/13/15  Yes [provider]  lisinopril (PRINIVIL,ZESTRIL) 20 MG tablet Take 1 tablet (20 mg total) by mouth daily. 03/16/17 06/14/17 Yes Plonk, Gwyndolyn Saxon, MD  metoprolol succinate (TOPROL-XL) 100 MG 24 hr tablet Take 1 tablet (100 mg total) by mouth daily. Take with or immediately following a meal. Patient taking differently: Take 100 mg by mouth daily at 12 noon. Take with or immediately following a meal. 08/17/16  Yes Wellington Hampshire, MD  spironolactone (ALDACTONE) 25 MG tablet TAKE 1 TABLET BY MOUTH ONCE DAILY 01/31/17  Yes Wellington Hampshire, MD  warfarin (COUMADIN) 5 MG tablet TAKE 1 TABLET BY MOUTH ONCE DAILY OR AS DIRECT BY COUMADIN CLINIC Patient taking differently: Take 5mg  by mouth 5 days per week and 7.5mg  2 days per week 11/24/16  Yes Wellington Hampshire, MD  [employer] Extract [phone] PSORIASIS MEDICATED EX) Apply 1 application topically daily as needed (psoriasis).    [provider]    Anti-infectives (From admission, onward)   Start     Dose/Rate Route Frequency Ordered Stop   03/31/17 0000  piperacillin-tazobactam (ZOSYN) IVPB 3.375 g     3.375 g 12.5 mL/hr over 240 Minutes Intravenous Every 8 hours 03/30/17 1808     03/31/17 0000  vancomycin (VANCOCIN) 1,250 mg in sodium chloride 0.9 % 250 mL IVPB     1,250 mg 166.7 mL/hr over 90 Minutes Intravenous Every 24 hours 03/30/17 1812     03/30/17 1645   piperacillin-tazobactam (ZOSYN) IVPB 3.375 g     3.375 g 100 mL/hr over 30 Minutes Intravenous  Once 03/30/17 1637 03/30/17 1716   03/30/17 1645  vancomycin (VANCOCIN) IVPB 1000 mg/200 mL premix     1,000 mg 200 mL/hr over 60 Minutes Intravenous  Once 03/30/17 1637 03/30/17 1848      Scheduled Meds: . amiodarone  100 mg Oral Daily  . atorvastatin  10 mg Oral Daily  . ferrous sulfate  325 mg Oral Daily  . furosemide  20 mg Oral Daily  . insulin aspart  0-5 Units Subcutaneous QHS  . insulin aspart  0-9 Units Subcutaneous TID WC  . lisinopril  20 mg Oral Daily  . metoprolol succinate  100 mg Oral Q1200  . sodium chloride flush  3 mL Intravenous Q12H  . spironolactone  25 mg Oral Daily  . warfarin  10 mg Oral ONCE-1800  . Warfarin - Pharmacist Dosing Inpatient   Does not apply q1800   Continuous Infusions: . sodium chloride Stopped (04/01/17 0800)  . piperacillin-tazobactam (ZOSYN)  IV Stopped (04/01/17 0440)  . vancomycin Stopped (04/01/17 0210)   PRN Meds:.sodium chloride, acetaminophen **OR** acetaminophen, albuterol, bisacodyl, HYDROcodone-acetaminophen, ketorolac, ondansetron **OR** ondansetron (ZOFRAN) IV, senna-docusate, sodium chloride flush  No Known Allergies  Physical Exam  Vitals  Blood pressure (!) 106/39, pulse (!) 59, temperature 97.8 F (36.6 C), temperature source Oral, resp. rate 18, height 5\' 7"  (1.702 m), weight 98.6 kg (217 lb 6.4 oz), SpO2 90 %.  Lower Extremity exam: Bandages removed today in the exam shows the toe is stable still gets some dry eschar at the tip of it in association with the ulceration but it is stable at this timeframe.  She does have some increased erythema to the lower leg just above the ankle on the tibial shaft area which is suggestive of some cellulitis or dermatitis to that leg.  Data Review  CBC Recent Labs  Lab 03/30/17 1548 03/31/17 0314 04/01/17 0312  WBC 15.9* 11.5* 6.4  HGB 11.4* 10.5* 10.5*  HCT 34.9* 32.5* 31.6*   PLT 169 153 155  MCV 86.6 87.8 87.2  MCH 28.2 28.2 29.0  MCHC 32.6 32.2 33.3  RDW 23.3* 23.4* 23.2*  LYMPHSABS 0.5*  --   --   MONOABS 1.4*  --   --   EOSABS 0.3  --   --   BASOSABS 0.1  --   --    ------------------------------------------------------------------------------------------------------------------  Chemistries  Recent Labs  Lab 03/28/17 1516 03/30/17 1548 03/31/17 0314  NA 138 134* 134*  K 4.8 4.9 4.7  CL 104 101 104  CO2 21 24 24   GLUCOSE 99 187* 119*  BUN 38* 39* 45*  CREATININE 1.48* 1.47* 1.81*  CALCIUM 9.5 9.2 8.5*  AST  --  63*  --   ALT  --  61*  --   ALKPHOS  --  190*  --   BILITOT  --  1.6*  --    -------------------------------------------------------------------------------------- Assessment & Plan: Stable toe ulcer and cellulitis.  Possibly some resolving cellulitis and left leg.  From my standpoint she is okay to go home and stay on oral antibiotic at this point likely something like Augmentin would be sufficient until we get lab results back.  Would recommend she put a heavily padded wet-to-dry saline dressing on the toe daily.  Would recommend elevation of the left foot and leg to avoid any exacerbation of the swelling in the leg and subsequently exacerbation of the dermatitis and potential cellulitis.  I will follow her again in the office next week.  Active Problems:   Sepsis [facility])   Family Communication: Plan discussed with patient and family   Perry Mount M.D on 04/01/2017 at 8:24 AM

## 2017-04-01 NOTE — Discharge Instructions (Signed)
[place] at [place]:  Diabetic diet, low fat diet  DISCHARGE CONDITION:  Stable  ACTIVITY:  Activity as tolerated  OXYGEN:  Home Oxygen: No.   Oxygen Delivery: room air  DISCHARGE LOCATION:  home    ADDITIONAL DISCHARGE INSTRUCTION: wound care instructions  put a heavily padded wet-to-dry saline dressing on the toe daily.  Would recommend elevation of the left foot and leg to avoid any exacerbation of the swelling in the leg and subsequently exacerbation of the dermatitis and potential cellulitis     If you experience worsening of your admission symptoms, develop shortness of breath, life threatening emergency, suicidal or homicidal thoughts you must seek medical attention immediately by calling 911 or calling your MD immediately  if symptoms less severe.  You Must read complete instructions/literature along with all the possible adverse reactions/side effects for all the Medicines you take and that have been prescribed to you. Take any new Medicines after you have completely understood and accpet all the possible adverse reactions/side effects.   Please note  You were cared for by a hospitalist during your hospital stay. If you have any questions about your discharge medications or the care you received while you were in the hospital after you are discharged, you can call the unit and asked to speak with the hospitalist on call if the hospitalist that took care of you is not available. Once you are discharged, your primary care physician will handle any further medical issues. Please note that NO REFILLS for any discharge medications will be authorized once you are discharged, as it is imperative that you return to your primary care physician (or establish a relationship with a primary care physician if you do not have one) for your aftercare needs so that they can reassess your need for medications and monitor your lab values.

## 2017-04-01 NOTE — Progress Notes (Signed)
ANTICOAGULATION CONSULT NOTE - Initial Consult  Pharmacy Consult for warfarin Indication: atrial fibrillation  No Known Allergies  Patient Measurements: Height: 5\' 7"  (170.2 cm) Weight: 217 lb 6.4 oz (98.6 kg) IBW/kg (Calculated) : 61.6  Vital Signs: Temp: 97.8 F (36.6 C) (12/22 2115) Temp Source: Oral (12/22 2115) BP: 106/39 (12/22 2115) Pulse Rate: 59 (12/22 2115)  Labs: Recent Labs    03/30/17 1548 03/30/17 1619 03/31/17 0314 04/01/17 0312  HGB 11.4*  --  10.5* 10.5*  HCT 34.9*  --  32.5* 31.6*  PLT 169  --  153 155  LABPROT  --  18.8* 20.5* 20.3*  INR  --  1.59 1.77 1.75  CREATININE 1.47*  --  1.81*  --     Estimated Creatinine Clearance: 35.9 mL/min (A) (by C-G formula based on SCr of 1.81 mg/dL (H)).  Assessment: 65 yo female on warfarin PTA. Med list states she takes 5 mg 5 days per week and 7.5 mg two days per week. Med rec states she took dose this AM.   Home dose: warfarin 5 mg PO daily with 7.5 mg PO twice weekly.                                  INR              Warfarin  12/21:                  1.59                 Already taken 12/22                   1.77      7.5 mg 12/23      1.75                      Goal of Therapy:  INR 2-3 Monitor platelets by anticoagulation protocol: Yes   Plan:  Will give warfarin 10 mg PO once this evening as INR remains subtherapeutic.  Will recheck INR with AM labs tomorrow.  Lenis Noon, PharmD 04/01/2017,7:48 AM

## 2017-04-01 NOTE — Discharge Summary (Signed)
[facility] [place] [facility], 66 y.o., DOB [birth year], MRN [id number]. Admission date: 03/30/2017 Discharge Date 04/01/2017 Primary MD Adline Potter, MD Admitting Physician Demetrios Loll, MD  Admission Diagnosis  Cellulitis of toe of left foot [L03.032] Sepsis, due to unspecified organism [facility]) [A41.9]  Discharge Diagnosis   Active Problems: Sepsis due to left second toe infection Hyponatremia Diabetes type 2 History of chronic persistent A. Fib Chronic kidney disease stage Essential hypertension       Hospital Course Meghan [name]  is a 66 y.o. female with a known history of multiple medical problems  presented the ED with fever and possible left second toe infection.    Patient was admitted and started on broad-spectrum antibiotics.  She was seen in consultation by podiatry who did a incision of that area cultures of the wound are currently pending however the foot is doing much better.  She was recommended to be discharged home with oral Augmentin with outpatient follow-up.              Consults  podiatry  Significant Tests:  See full reports for all details     Dg Toe 2nd Left  Result Date: 03/30/2017 CLINICAL DATA:  Swelling, increased warmth and pink coloration of the left second toe. Previous amputation of the first toe. EXAM: LEFT SECOND TOE COMPARISON:  09/21/2016. FINDINGS: Interval amputation of the first toe. Moderate second DIP joint spur formation. A small linear calcific density areas again demonstrated lateral to the distal aspect of the second proximal phalanx, without interval discrete periosteal reaction. IMPRESSION: 1. No interval changes of acute osteomyelitis. 2. Second DIP joint degenerative changes. Electronically Signed   By: Claudie Revering M.D.   On: 03/30/2017 17:18      Estephania Licciardi feels well denies any complaints Objective:   Blood pressure (!) 106/39, pulse (!) 59, temperature 97.8 F (36.6 C), temperature  source Oral, resp. rate 18, height 5\' 7"  (1.702 m), weight 217 lb 6.4 oz (98.6 kg), SpO2 90 %.  .  Intake/Output Summary (Last 24 hours) at 04/01/2017 1256 Last data filed at 04/01/2017 0900 Gross per 24 hour  Intake 600 ml  Output -  Net 600 ml    Exam VITAL SIGNS: Blood pressure (!) 106/39, pulse (!) 59, temperature 97.8 F (36.6 C), temperature source Oral, resp. rate 18, height 5\' 7"  (1.702 m), weight 217 lb 6.4 oz (98.6 kg), SpO2 90 %.  GENERAL:  66 y.o.-year-old patient lying in the bed with no acute distress.  EYES: Pupils equal, round, reactive to light and accommodation. No scleral icterus. Extraocular muscles intact.  HEENT: Head atraumatic, normocephalic. Oropharynx and nasopharynx clear.  NECK:  Supple, no jugular venous distention. No thyroid enlargement, no tenderness.  LUNGS: Normal breath sounds bilaterally, no wheezing, rales,rhonchi or crepitation. No use of accessory muscles of respiration.  CARDIOVASCULAR: S1, S2 normal. No murmurs, rubs, or gallops.  ABDOMEN: Soft, nontender, nondistended. Bowel sounds present. No organomegaly or mass.  EXTREMITIES: Left great toe dressing in place NEUROLOGIC: Cranial nerves II through XII are intact. Muscle strength 5/5 in all extremities. Sensation intact. Gait not checked.  PSYCHIATRIC: The patient is alert and oriented x 3.  SKIN: No obvious rash, lesion, or ulcer.   Data Review     CBC w Diff:  Lab Results  Component Value Date   WBC 6.4 04/01/2017   HGB 10.5 (L) 04/01/2017   HGB 9.8 (L) 01/30/2017   HCT 31.6 (L) 04/01/2017  HCT 33.1 (L) 01/30/2017   PLT 155 04/01/2017   PLT 272 01/30/2017   LYMPHOPCT 3 03/30/2017   LYMPHOPCT 6.9 07/07/2013   MONOPCT 9 03/30/2017   MONOPCT 6 07/09/2013   MONOPCT 4.8 07/07/2013   EOSPCT 2 03/30/2017   EOSPCT 0.6 07/07/2013   BASOPCT 0 03/30/2017   BASOPCT 0.7 07/07/2013   CMP:  Lab Results  Component Value Date   NA 134 (L) 03/31/2017   NA 138 03/28/2017   NA 134 (L)  07/10/2013   K 4.7 03/31/2017   K 3.6 07/10/2013   CL 104 03/31/2017   CL 98 07/10/2013   CO2 24 03/31/2017   CO2 32 07/10/2013   BUN 45 (H) 03/31/2017   BUN 38 (H) 03/28/2017   BUN 6 (L) 07/10/2013   CREATININE 1.81 (H) 03/31/2017   CREATININE 0.65 07/10/2013   PROT 9.1 (H) 03/30/2017   PROT 7.9 12/27/2016   PROT 7.5 07/08/2013   ALBUMIN 3.4 (L) 03/30/2017   ALBUMIN 3.6 12/27/2016   ALBUMIN 1.7 (L) 07/08/2013   BILITOT 1.6 (H) 03/30/2017   BILITOT 1.2 12/27/2016   BILITOT 0.4 07/08/2013   ALKPHOS 190 (H) 03/30/2017   ALKPHOS 72 07/08/2013   AST 63 (H) 03/30/2017   AST 12 (L) 07/08/2013   ALT 61 (H) 03/30/2017   ALT 9 (L) 07/08/2013  .  Micro Results Recent Results (from the past 240 hour(s))  Blood culture (routine x 2)     Status: None (Preliminary result)   Collection Time: 03/30/17  4:18 PM  Result Value Ref Range Status   Specimen Description BLOOD RIGHT ANTECUBITAL  Final   Special Requests   Final    BOTTLES DRAWN AEROBIC AND ANAEROBIC Blood Culture adequate volume   Culture   Final    NO GROWTH 2 DAYS Performed at [facility], [address], [place], [place] [address]    Report Status PENDING  Incomplete  Blood culture (routine x 2)     Status: None (Preliminary result)   Collection Time: 03/30/17  4:27 PM  Result Value Ref Range Status   Specimen Description BLOOD LEFT ANTECUBITAL  Final   Special Requests   Final    BOTTLES DRAWN AEROBIC AND ANAEROBIC Blood Culture adequate volume   Culture   Final    NO GROWTH 2 DAYS Performed at [facility], [address], [place], [place] [address]    Report Status PENDING  Incomplete  Aerobic Culture (superficial specimen)     Status: None (Preliminary result)   Collection Time: 03/31/17 11:11 AM  Result Value Ref Range Status   Specimen Description   Final    WOUND Performed at [facility], [address], [place], [place] [address]    Special Requests   Final    LEFT  FOOT Performed at [facility], [address] [place], [place] [address]    Gram Stain   Final    RARE WBC PRESENT, PREDOMINANTLY PMN RARE GRAM POSITIVE COCCI IN PAIRS    Culture   Final    FEW STAPHYLOCOCCUS AUREUS CULTURE REINCUBATED FOR BETTER GROWTH Performed at [facility], [place] [address] [place], [place] [address]    Report Status PENDING  Incomplete        Code Status Orders  (From admission, onward)        Start     Ordered   03/30/17 2003  Do not attempt resuscitation (DNR)  Continuous    Question Answer  Comment  In the event of cardiac or respiratory ARREST Do not call a "code blue"   In the event of cardiac or respiratory ARREST Do not perform Intubation, CPR, defibrillation or ACLS   In the event of cardiac or respiratory ARREST Use medication by any route, position, wound care, and other measures to relive pain and suffering. May use oxygen, suction and manual treatment of airway obstruction as needed for comfort.      03/30/17 2002    Code Status History    Date Active Date Inactive Code Status Order ID Comments User Context   03/12/2017 12:36 03/12/2017 17:20 Full Code [id number]  Wellington Hampshire, MD Inpatient   09/21/2016 20:56 09/24/2016 19:22 Full Code [id number]  Theodoro Grist, MD Inpatient   07/26/2016 15:30 07/28/2016 18:12 Full Code [id number]  Theodoro Grist, MD Inpatient          Follow-up Information    Troxler, Rodman Key, DPM In 4 days.   Specialty:  Podiatry Why:  CALL OFFICE FOR DR. APPOINTMENT Contact information: [facility] [place] [address] [phone]        Adline Potter, MD Follow up in 2 week(s).   Specialties:  Family Medicine, Geriatric Medicine Why:  hosp f/u Contact information: [address] [place] [address] [phone]        Wellington Hampshire, MD.   Specialty:  Cardiology Why:  CALL DR. OFFICE FOR APPOINTMENT Contact  information: [place] [address] [phone]           Discharge Medications   Allergies as of 04/01/2017   No Known Allergies     Medication List    TAKE these medications   amiodarone 200 MG tablet Commonly known as:  PACERONE Take 0.5 tablets (100 mg total) by mouth daily.   amoxicillin-clavulanate 875-125 MG tablet Commonly known as:  AUGMENTIN Take 1 tablet by mouth 2 (two) times daily for 7 days.   atorvastatin 10 MG tablet Commonly known as:  LIPITOR TAKE ONE TABLET BY MOUTH ONCE DAILY What changed:    how much to take  how to take this  when to take this   furosemide 40 MG tablet Commonly known as:  LASIX Take 0.5 tablets (20 mg total) by mouth daily.   glipiZIDE 10 MG tablet Commonly known as:  GLUCOTROL Take 10 mg by mouth 2 (two) times daily.   Iron 325 (65 Fe) MG Tabs Take 1 tablet (325 mg total) by mouth daily. What changed:  how much to take   lisinopril 20 MG tablet Commonly known as:  PRINIVIL,ZESTRIL Take 1 tablet (20 mg total) by mouth daily.   metoprolol succinate 100 MG 24 hr tablet Commonly known as:  TOPROL-XL Take 1 tablet (100 mg total) by mouth daily. Take with or immediately following a meal. What changed:    when to take this  additional instructions   MG217 PSORIASIS MEDICATED EX Apply 1 application topically daily as needed (psoriasis).   spironolactone 25 MG tablet Commonly known as:  ALDACTONE TAKE 1 TABLET BY MOUTH ONCE DAILY   warfarin 5 MG tablet Commonly known as:  COUMADIN Take as directed. If you are unsure how to take this medication, talk to your nurse or doctor. Original instructions:  TAKE 1 TABLET BY MOUTH ONCE DAILY OR AS DIRECT BY COUMADIN CLINIC What changed:  See the new instructions.          Total Time in  preparing paper work, data evaluation and todays exam - 35 minutes  Dustin Flock M.D on 04/01/2017 at 12:56 PM  [facility]    Office  [phone]

## 2017-04-03 LAB — AEROBIC CULTURE  (SUPERFICIAL SPECIMEN)

## 2017-04-03 LAB — AEROBIC CULTURE W GRAM STAIN (SUPERFICIAL SPECIMEN)

## 2017-04-04 DIAGNOSIS — E11621 Type 2 diabetes mellitus with foot ulcer: Secondary | ICD-10-CM | POA: Diagnosis not present

## 2017-04-04 DIAGNOSIS — L97521 Non-pressure chronic ulcer of other part of left foot limited to breakdown of skin: Secondary | ICD-10-CM | POA: Diagnosis not present

## 2017-04-04 DIAGNOSIS — L97509 Non-pressure chronic ulcer of other part of unspecified foot with unspecified severity: Secondary | ICD-10-CM | POA: Diagnosis not present

## 2017-04-04 LAB — CULTURE, BLOOD (ROUTINE X 2)
Culture: NO GROWTH
Culture: NO GROWTH
SPECIAL REQUESTS: ADEQUATE
Special Requests: ADEQUATE

## 2017-04-11 ENCOUNTER — Ambulatory Visit (INDEPENDENT_AMBULATORY_CARE_PROVIDER_SITE_OTHER): Payer: Medicare Other

## 2017-04-11 DIAGNOSIS — Z5181 Encounter for therapeutic drug level monitoring: Secondary | ICD-10-CM | POA: Diagnosis not present

## 2017-04-11 DIAGNOSIS — I4891 Unspecified atrial fibrillation: Secondary | ICD-10-CM | POA: Diagnosis not present

## 2017-04-11 LAB — POCT INR: INR: 1.9

## 2017-04-11 NOTE — Patient Instructions (Signed)
Please take extra tablet tonight, then resume dosage of 1 pill every day except 1.5 pills on Mondays, Wednesdays and Fridays. Please be consistent with your greens intake.   Recheck in 3 weeks.

## 2017-04-12 DIAGNOSIS — L97521 Non-pressure chronic ulcer of other part of left foot limited to breakdown of skin: Secondary | ICD-10-CM | POA: Diagnosis not present

## 2017-04-20 ENCOUNTER — Ambulatory Visit: Payer: Medicare Other | Admitting: Cardiovascular Disease

## 2017-04-26 ENCOUNTER — Ambulatory Visit: Payer: Medicare Other | Admitting: Cardiovascular Disease

## 2017-04-27 DIAGNOSIS — I10 Essential (primary) hypertension: Secondary | ICD-10-CM | POA: Diagnosis not present

## 2017-04-27 DIAGNOSIS — E785 Hyperlipidemia, unspecified: Secondary | ICD-10-CM | POA: Diagnosis not present

## 2017-04-27 DIAGNOSIS — E1169 Type 2 diabetes mellitus with other specified complication: Secondary | ICD-10-CM | POA: Diagnosis not present

## 2017-04-27 DIAGNOSIS — E1142 Type 2 diabetes mellitus with diabetic polyneuropathy: Secondary | ICD-10-CM | POA: Diagnosis not present

## 2017-04-27 DIAGNOSIS — E1159 Type 2 diabetes mellitus with other circulatory complications: Secondary | ICD-10-CM | POA: Diagnosis not present

## 2017-04-28 ENCOUNTER — Emergency Department
Admission: EM | Admit: 2017-04-28 | Discharge: 2017-04-28 | Disposition: A | Discharge status: Home / Self Care | Payer: Medicare Other | Attending: Emergency Medicine | Admitting: Emergency Medicine

## 2017-04-28 ENCOUNTER — Ambulatory Visit
Admission: EM | Admit: 2017-04-28 | Discharge: 2017-04-28 | Disposition: A | Discharge status: Home Health | Payer: Medicare Other | Source: Home / Self Care

## 2017-04-28 ENCOUNTER — Other Ambulatory Visit: Payer: Self-pay

## 2017-04-28 DIAGNOSIS — Z7901 Long term (current) use of anticoagulants: Secondary | ICD-10-CM | POA: Diagnosis not present

## 2017-04-28 DIAGNOSIS — R799 Abnormal finding of blood chemistry, unspecified: Secondary | ICD-10-CM | POA: Diagnosis present

## 2017-04-28 DIAGNOSIS — Z79899 Other long term (current) drug therapy: Secondary | ICD-10-CM | POA: Diagnosis not present

## 2017-04-28 DIAGNOSIS — N183 Chronic kidney disease, stage 3 (moderate): Secondary | ICD-10-CM | POA: Diagnosis not present

## 2017-04-28 DIAGNOSIS — I130 Hypertensive heart and chronic kidney disease with heart failure and stage 1 through stage 4 chronic kidney disease, or unspecified chronic kidney disease: Secondary | ICD-10-CM | POA: Insufficient documentation

## 2017-04-28 DIAGNOSIS — N179 Acute kidney failure, unspecified: Secondary | ICD-10-CM | POA: Diagnosis not present

## 2017-04-28 DIAGNOSIS — E119 Type 2 diabetes mellitus without complications: Secondary | ICD-10-CM | POA: Insufficient documentation

## 2017-04-28 DIAGNOSIS — E875 Hyperkalemia: Secondary | ICD-10-CM | POA: Diagnosis not present

## 2017-04-28 DIAGNOSIS — I2510 Atherosclerotic heart disease of native coronary artery without angina pectoris: Secondary | ICD-10-CM | POA: Diagnosis not present

## 2017-04-28 DIAGNOSIS — Z87891 Personal history of nicotine dependence: Secondary | ICD-10-CM | POA: Insufficient documentation

## 2017-04-28 DIAGNOSIS — I5022 Chronic systolic (congestive) heart failure: Secondary | ICD-10-CM | POA: Insufficient documentation

## 2017-04-28 DIAGNOSIS — Z7984 Long term (current) use of oral hypoglycemic drugs: Secondary | ICD-10-CM | POA: Diagnosis not present

## 2017-04-28 LAB — COMPREHENSIVE METABOLIC PANEL
ALBUMIN: 3.4 g/dL — AB (ref 3.5–5.0)
ALK PHOS: 146 U/L — AB (ref 38–126)
ALT: 79 U/L — ABNORMAL HIGH (ref 14–54)
ANION GAP: 7 (ref 5–15)
AST: 78 U/L — ABNORMAL HIGH (ref 15–41)
BUN: 62 mg/dL — ABNORMAL HIGH (ref 6–20)
CALCIUM: 9 mg/dL (ref 8.9–10.3)
CHLORIDE: 102 mmol/L (ref 101–111)
CO2: 22 mmol/L (ref 22–32)
Creatinine, Ser: 1.95 mg/dL — ABNORMAL HIGH (ref 0.44–1.00)
GFR calc Af Amer: 29 mL/min — ABNORMAL LOW (ref >60)
GFR calc non Af Amer: 25 mL/min — ABNORMAL LOW (ref >60)
GLUCOSE: 257 mg/dL — AB (ref 65–99)
Potassium: 6.4 mmol/L (ref 3.5–5.1)
SODIUM: 131 mmol/L — AB (ref 135–145)
Total Bilirubin: 1.1 mg/dL (ref 0.3–1.2)
Total Protein: 8.8 g/dL — ABNORMAL HIGH (ref 6.5–8.1)

## 2017-04-28 LAB — CBC
HEMATOCRIT: 39 % (ref 35.0–47.0)
HEMOGLOBIN: 12.8 g/dL (ref 12.0–16.0)
MCH: 29.2 pg (ref 26.0–34.0)
MCHC: 32.8 g/dL (ref 32.0–36.0)
MCV: 89.2 fL (ref 80.0–100.0)
Platelets: 207 10*3/uL (ref 150–440)
RBC: 4.37 MIL/uL (ref 3.80–5.20)
RDW: 16.7 % — ABNORMAL HIGH (ref 11.5–14.5)
WBC: 5.5 10*3/uL (ref 3.6–11.0)

## 2017-04-28 MED ORDER — SODIUM CHLORIDE 0.9 % IV SOLN
1.0000 g | Freq: Once | INTRAVENOUS | Status: AC
  Administered 2017-04-28: 1 g via INTRAVENOUS
  Filled 2017-04-28: qty 10

## 2017-04-28 MED ORDER — SODIUM BICARBONATE 4 % IV SOLN
5.0000 mL | Freq: Once | INTRAVENOUS | Status: AC
  Administered 2017-04-28: 5 mL via INTRAVENOUS
  Filled 2017-04-28: qty 5

## 2017-04-28 MED ORDER — DEXTROSE 50 % IV SOLN
50.0000 mL | Freq: Once | INTRAVENOUS | Status: AC
  Administered 2017-04-28: 50 mL via INTRAVENOUS
  Filled 2017-04-28: qty 50

## 2017-04-28 MED ORDER — SODIUM CHLORIDE 0.9 % IV BOLUS (SEPSIS)
1000.0000 mL | Freq: Once | INTRAVENOUS | Status: AC
  Administered 2017-04-28: 1000 mL via INTRAVENOUS

## 2017-04-28 MED ORDER — SODIUM POLYSTYRENE SULFONATE 15 GM/60ML PO SUSP
30.0000 g | Freq: Once | ORAL | Status: AC
  Administered 2017-04-28: 30 g via ORAL
  Filled 2017-04-28: qty 120

## 2017-04-28 MED ORDER — INSULIN ASPART 100 UNIT/ML ~~LOC~~ SOLN
10.0000 [IU] | Freq: Once | SUBCUTANEOUS | Status: AC
  Administered 2017-04-28: 10 [IU] via INTRAVENOUS
  Filled 2017-04-28: qty 1

## 2017-04-28 NOTE — ED Triage Notes (Signed)
Pt came to ED via pov. Reports her pcp called her last night and said potassium was elevated and wanted her level  to be re-checked. Pt not c/o any pain.

## 2017-04-28 NOTE — Discharge Instructions (Signed)
You are evaluated for elevated potassium, which may be due to the spironolactone and possible dehydration.  Your kidney function was found to be decreased because acute renal failure.  You are given IV fluids today.  You are given medications to help decrease the potassium.  You are going to need your potassium level lab redrawn on Monday, call your primary care doctor's office for this.  Return to the emergency room immediately for any worsening condition including palpitations, chest pain, trouble breathing, dizziness or passing out, or any other symptoms concerning to you.

## 2017-04-28 NOTE — ED Provider Notes (Signed)
[facility] Emergency Department Provider Note ____________________________________________   I have reviewed the triage vital signs and the triage nursing note.  HISTORY  Chief Complaint Abnormal Lab   Historian Patient and daughter  HPI Meghan [name] is a 67 y.o. female presents here after being told to come to the ED due to high potassium which was drawn on routine labs yesterday from her primary care doctor's office.  Patient states she did not know that she has borderline kidney function.  She had routine blood work drawn yesterday and was called and told that her potassium level was high and that she should go to urgent care or ER today.  She went to urgent care and they told her to come to the ER.  She is very annoyed that she is here in the ER and does not want further treatment initially.  No chest pain or trouble breathing or dizziness or palpitations.  Denies recent illnesses.  She was told to stop her spironolactone last night by her primary care doctor after being told about high potassium.   Past Medical History:  Diagnosis Date  . Carotid arterial disease ([place])    a. 02/2012 s/p L CEA.  . Coronary artery disease    a. 02/2012 Lexi MV: Anterior, anteroseptal, septal, apical infarct with mild peri-infarct ischemia, EF 41%- medically managed due to anemia; b. 03/2017 Cath: LM nl, LAD 99ost/p/m - faint L->L collaterals, RI 20ost, LCX 82m, RCA 20p, 61m-->Med Rx for chronic subtotal LAD dzs. Avoid R Radial access in future.  . 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 Hyperlipidemia   . Hypertension   . Ischemic cardiomyopathy    a. 06/2013 Echo: EF 35-40%, glob HK, mildly dil LA, mild LVH, mild TR, mild to mod MR; b. 07/2016 Echo: EF 25-30%, sev mid-apicalanteroseptal, ant, apical HK, mild MR, sev dil LA, mod dil RA, mod TR, PASP 65mmHg; c. 11/2016 Echo: EF 20-25%, antsept DK, Gr2 DD, mild MR, mod dil LA/RV/RA, mod red RV fxn,  mod-sev TR, PASP 40-56mmHg.  . Morbid obesity ([place])   . Normocytic anemia   . Persistent atrial fibrillation (HCC)    a. CHA2DS2VASc = 7-->coumadin; maintaining sinus on amio.  . Stroke [facility]) 2013    Patient Active Problem List   Diagnosis Date Noted  . Sepsis ([place]) 03/30/2017  . Elevated TSH 12/29/2016  . Psoriasis 12/26/2016  . Obesity, Class III, BMI 40-49.9 (morbid obesity) ([place]) 12/26/2016  . Iron deficiency anemia 12/25/2016  . Toe osteomyelitis, left ([place]) 09/21/2016  . CKD (chronic kidney disease), stage III ([place]) 07/26/2016  . History of CVA (cerebrovascular accident) without residual deficits 09/03/2013  . Hyperlipidemia 09/03/2013  . Type 2 diabetes mellitus with peripheral neuropathy ([place]) 09/03/2013  . Paroxysmal atrial fibrillation ([place]) 07/16/2013  . Ischemic cardiomyopathy 07/16/2013  . Hypertension 07/16/2013  . Chronic systolic heart failure ([place]) 04/12/2012  . Coronary atherosclerosis   . Dyspnea 03/01/2012    Past Surgical History:  Procedure Laterality Date  . AMPUTATION TOE Left 09/22/2016   Procedure: AMPUTATION TOE;  Surgeon: Albertine Patricia, DPM;  Location: [facility] ORS;  Service: Podiatry;  Laterality: Left;  . CARDIOVERSION N/A 07/28/2016   Procedure: CARDIOVERSION;  Surgeon: Wellington Hampshire, MD;  Location: [facility] ORS;  Service: Cardiovascular;  Laterality: N/A;  . CARDIOVERSION N/A 08/07/2016   Procedure: CARDIOVERSION;  Surgeon: Wellington Hampshire, MD;  Location: [facility] ORS;  Service: Cardiovascular;  Laterality: N/A;  . CAROTID ENDARTERECTOMY  [facility]; Dr. Lucky Cowboy  . CHOLECYSTECTOMY    . LEG SURGERY     right;infection  . RIGHT/LEFT HEART CATH AND CORONARY ANGIOGRAPHY Bilateral 03/12/2017   Procedure: RIGHT/LEFT HEART CATH AND CORONARY ANGIOGRAPHY;  Surgeon: Wellington Hampshire, MD;  Location: [facility];  Service: Cardiovascular;  Laterality: Bilateral;  . TONSILLECTOMY      Prior to Admission medications   Medication Sig Start Date End Date  Taking? Authorizing Provider  amiodarone (PACERONE) 200 MG tablet Take 0.5 tablets (100 mg total) by mouth daily. 01/03/17   Plonk, Gwyndolyn Saxon, MD  atorvastatin (LIPITOR) 10 MG tablet TAKE ONE TABLET BY MOUTH ONCE DAILY Patient taking differently: TAKE ONE TABLET BY MOUTH ONCE DAILY AT NOON 10/23/16   Wellington Hampshire, MD  Coal Tar Extract [phone] PSORIASIS MEDICATED EX) Apply 1 application topically daily as needed (psoriasis).    [provider]  Ferrous Sulfate (IRON) 325 (65 Fe) MG TABS Take 1 tablet (325 mg total) by mouth daily. Patient taking differently: Take 5 tablets by mouth daily.  12/29/16   Plonk, Gwyndolyn Saxon, MD  furosemide (LASIX) 40 MG tablet Take 0.5 tablets (20 mg total) by mouth daily. 03/07/17   Wellington Hampshire, MD  glipiZIDE (GLUCOTROL) 10 MG tablet Take 10 mg by mouth 2 (two) times daily.  08/13/15   [provider]  lisinopril (PRINIVIL,ZESTRIL) 20 MG tablet Take 1 tablet (20 mg total) by mouth daily. 03/16/17 06/14/17  Plonk, Gwyndolyn Saxon, MD  metoprolol succinate (TOPROL-XL) 100 MG 24 hr tablet Take 1 tablet (100 mg total) by mouth daily. Take with or immediately following a meal. Patient taking differently: Take 100 mg by mouth daily at 12 noon. Take with or immediately following a meal. 08/17/16   Wellington Hampshire, MD  spironolactone (ALDACTONE) 25 MG tablet TAKE 1 TABLET BY MOUTH ONCE DAILY 01/31/17   Wellington Hampshire, MD  warfarin (COUMADIN) 5 MG tablet TAKE 1 TABLET BY MOUTH ONCE DAILY OR  AS  DIRECTED  BY  COUMADIN  CLINIC 04/04/17   Wellington Hampshire, MD    No Known Allergies  Family History  Problem Relation Age of Onset  . Heart disease Mother     Social History Social History   Tobacco Use  . Smoking status: Former Smoker    Packs/day: 0.50    Years: 5.00    Pack years: 2.50    Types: Cigarettes  . Smokeless tobacco: Never Used  Substance Use Topics  . Alcohol use: No  . Drug use: No    Review of Systems  Constitutional: Negative for  fever. Eyes: Negative for visual changes. ENT: Negative for sore throat. Cardiovascular: Negative for chest pain. Respiratory: Negative for shortness of breath. Gastrointestinal: Negative for abdominal pain, vomiting and diarrhea. Genitourinary: Negative for dysuria. Musculoskeletal: Negative for back pain. Skin: Negative for rash. Neurological: Negative for headache.  ____________________________________________   PHYSICAL EXAM:  VITAL SIGNS: ED Triage Vitals [04/28/17 1139]  Enc Vitals Group     BP 116/61     Pulse Rate (!) 56     Resp 18     Temp (!) 97.5 F (36.4 C)     Temp Source Oral     SpO2 99 %     Weight 213 lb (96.6 kg)     Height 5\' 7"  (1.702 m)     Head Circumference      Peak Flow      Pain Score      Pain Loc  Pain Edu?      Excl. in [place]?      Constitutional: Alert and oriented. Well appearing and in no distress. HEENT   Head: Normocephalic and atraumatic.      Eyes: Conjunctivae are normal. Pupils equal and round.       Ears:         Nose: No congestion/rhinnorhea.   Mouth/Throat: Mucous membranes are moist.   Neck: No stridor. Cardiovascular/Chest: Normal rate, regular rhythm.  No murmurs, rubs, or gallops. Respiratory: Normal respiratory effort without tachypnea nor retractions. Breath sounds are clear and equal bilaterally. No wheezes/rales/rhonchi. Gastrointestinal:  Nontender.    Genitourinary/rectal:Deferred Musculoskeletal: Nontender with normal range of motion in all extremities. Neurologic:  Normal speech and language. No gross or focal neurologic deficits are appreciated. Skin:  Skin is warm, dry and intact. No rash noted. Psychiatric: She is frustrated initially and wanting to refuse treatment, but agreed to get treatment here in the emergency department and not be admitted.  T.   ____________________________________________  LABS (pertinent positives/negatives) I, Lisa Roca, MD the attending physician have reviewed  the labs noted below.  Labs Reviewed  COMPREHENSIVE METABOLIC PANEL - Abnormal; Notable for the following components:      Result Value   Sodium 131 (*)    Potassium 6.4 (*)    Glucose, Bld 257 (*)    BUN 62 (*)    Creatinine, Ser 1.95 (*)    Total Protein 8.8 (*)    Albumin 3.4 (*)    AST 78 (*)    ALT 79 (*)    Alkaline Phosphatase 146 (*)    GFR calc non Af Amer 25 (*)    GFR calc Af Amer 29 (*)    All other components within normal limits  CBC - Abnormal; Notable for the following components:   RDW 16.7 (*)    All other components within normal limits    ____________________________________________    EKG I, Lisa Roca, MD, the attending physician have personally viewed and interpreted all ECGs.  52 bpm.  Normal sinus rhythm.  Left bundle branch block, similar to 12/21 2018.  Nonspecific ST and T wave. ____________________________________________  RADIOLOGY All Xrays were viewed by me.  Imaging interpreted by Radiologist, and I, Lisa Roca, MD the attending physician have reviewed the radiologist interpretation noted below.  None __________________________________________  PROCEDURES  Procedure(s) performed: None  Critical Care performed: None   ____________________________________________  ED COURSE / ASSESSMENT AND PLAN  Pertinent labs & imaging results that were available during my care of the patient were reviewed by me and considered in my medical decision making (see chart for details).    Patient does indeed have elevated potassium, likely underlying cause due to either mild acute worsening of her chronic renal failure and likely possibly related to spironolactone.  Patient is asymptomatic, however we discussed the risk of cardiac arrhythmia with hyperkalemia.  She was initially refusing to have any testing or treatment done here.  After we discussed further, patient was agreeable to go ahead and have treatment in the ED and then be  discharged home for outpatient follow-up.  I had spoken with Dr. Candiss Norse who recommends 1 dose of lactulose and then follow-up.  Patient care transferred to Dr. Jimmye Norman at shift change.  Awaiting completion of iv fluids and medications and may be discharged with my prepared discharge instructions.   CONSULTATIONS: I discussed with Dr. Candiss Norse, nephrology, recommended okay to discharge with dose of lactulose, follow-up at primary doctor's  office on Monday for repeat potassium testing, referral to nephrology, and discontinue the spironolactone.  Patient / Family / Caregiver informed of clinical course, medical decision-making process, and agree with plan.   I discussed return precautions, follow-up instructions, and discharge instructions with patient and/or family.  Discharge Instructions : You are evaluated for elevated potassium, which may be due to the spironolactone and possible dehydration.  Your kidney function was found to be decreased because acute renal failure.  You are given IV fluids today.  You are given medications to help decrease the potassium.  You are going to need your potassium level lab redrawn on Monday, call your primary care doctor's office for this.  Return to the emergency room immediately for any worsening condition including palpitations, chest pain, trouble breathing, dizziness or passing out, or any other symptoms concerning to you.    ___________________________________________   FINAL CLINICAL IMPRESSION(S) / ED DIAGNOSES   Final diagnoses:  Acute renal failure, unspecified acute renal failure type ([place])  Hyperkalemia      ___________________________________________        Note: This dictation was prepared with Dragon dictation. Any transcriptional errors that result from this process are unintentional    Lisa Roca, MD 04/28/17 [phone]

## 2017-05-01 ENCOUNTER — Other Ambulatory Visit
Admission: RE | Admit: 2017-05-01 | Discharge: 2017-05-01 | Disposition: A | Discharge status: Home / Self Care | Payer: Medicare Other | Source: Ambulatory Visit | Attending: Cardiovascular Disease | Admitting: Cardiovascular Disease

## 2017-05-01 ENCOUNTER — Telehealth: Payer: Self-pay | Admitting: Cardiovascular Disease

## 2017-05-01 ENCOUNTER — Other Ambulatory Visit: Payer: Self-pay

## 2017-05-01 DIAGNOSIS — E875 Hyperkalemia: Secondary | ICD-10-CM

## 2017-05-01 LAB — BASIC METABOLIC PANEL
Anion gap: 6 (ref 5–15)
BUN: 52 mg/dL — ABNORMAL HIGH (ref 6–20)
CALCIUM: 9.1 mg/dL (ref 8.9–10.3)
CO2: 22 mmol/L (ref 22–32)
CREATININE: 1.53 mg/dL — AB (ref 0.44–1.00)
Chloride: 107 mmol/L (ref 101–111)
GFR, EST AFRICAN AMERICAN: 39 mL/min — AB (ref >60)
GFR, EST NON AFRICAN AMERICAN: 34 mL/min — AB (ref >60)
Glucose, Bld: 377 mg/dL — ABNORMAL HIGH (ref 65–99)
Potassium: 5.2 mmol/L — ABNORMAL HIGH (ref 3.5–5.1)
Sodium: 135 mmol/L (ref 135–145)

## 2017-05-01 NOTE — Telephone Encounter (Signed)
Pt had January 17 critical potassium of 7.0 per [facility], [place] labs. She was instructed by Dr. Honor Junes to go to the ED but pt did not way to go, stating she would have recheck next day at Urgent Care. Potassium in ED on January 19 was 6.4. She was given IVF and lactulose and instructed to have recheck of labs Monday morning.  Pt calls the office today asking to have potassium lab in our office.  Reviewed with Dr. Fletcher Anon who recommends: -Continue to hold aldactone -Stop lisinopril -BMET today at the [place] -Referral to nephrology.  Pt verbalized understanding and can be at the lab at 3:30pm today. Medication list updated. STAT labs ordered. Called referral to [facility] with Dr. Holley Raring, [facility], [place].  Faxed demographics sheet and labs to [phone]

## 2017-05-01 NOTE — Telephone Encounter (Signed)
Potassium 5.2 Dr. Fletcher Anon aware. Reviewed with patient and provided phone number to [facility]. She will call their office if she does not receive a call in the next few days.

## 2017-05-01 NOTE — Telephone Encounter (Signed)
Pt would like to have her Potassium checked. Please call to discuss

## 2017-05-02 ENCOUNTER — Other Ambulatory Visit: Payer: Self-pay | Admitting: Family Medicine

## 2017-05-02 ENCOUNTER — Ambulatory Visit (INDEPENDENT_AMBULATORY_CARE_PROVIDER_SITE_OTHER): Payer: Medicare Other

## 2017-05-02 DIAGNOSIS — Z5181 Encounter for therapeutic drug level monitoring: Secondary | ICD-10-CM | POA: Diagnosis not present

## 2017-05-02 DIAGNOSIS — I4891 Unspecified atrial fibrillation: Secondary | ICD-10-CM | POA: Diagnosis not present

## 2017-05-02 LAB — POCT INR: INR: 2.6

## 2017-05-02 NOTE — Patient Instructions (Signed)
Please continue dosage of 1 pill every day except 1.5 pills on Mondays, Wednesdays and Fridays. Please be consistent with your greens intake.   Recheck in 3 weeks.

## 2017-05-07 DIAGNOSIS — L03039 Cellulitis of unspecified toe: Secondary | ICD-10-CM | POA: Diagnosis not present

## 2017-05-07 DIAGNOSIS — E11621 Type 2 diabetes mellitus with foot ulcer: Secondary | ICD-10-CM | POA: Diagnosis not present

## 2017-05-07 DIAGNOSIS — L02619 Cutaneous abscess of unspecified foot: Secondary | ICD-10-CM | POA: Diagnosis not present

## 2017-05-07 DIAGNOSIS — L97509 Non-pressure chronic ulcer of other part of unspecified foot with unspecified severity: Secondary | ICD-10-CM | POA: Diagnosis not present

## 2017-05-07 DIAGNOSIS — L97521 Non-pressure chronic ulcer of other part of left foot limited to breakdown of skin: Secondary | ICD-10-CM | POA: Diagnosis not present

## 2017-05-14 DIAGNOSIS — L97521 Non-pressure chronic ulcer of other part of left foot limited to breakdown of skin: Secondary | ICD-10-CM | POA: Diagnosis not present

## 2017-05-14 DIAGNOSIS — L97509 Non-pressure chronic ulcer of other part of unspecified foot with unspecified severity: Secondary | ICD-10-CM | POA: Diagnosis not present

## 2017-05-14 DIAGNOSIS — E11621 Type 2 diabetes mellitus with foot ulcer: Secondary | ICD-10-CM | POA: Diagnosis not present

## 2017-05-20 ENCOUNTER — Other Ambulatory Visit: Payer: Self-pay | Admitting: Cardiovascular Disease

## 2017-06-06 ENCOUNTER — Ambulatory Visit (INDEPENDENT_AMBULATORY_CARE_PROVIDER_SITE_OTHER): Payer: Medicare Other

## 2017-06-06 DIAGNOSIS — I4891 Unspecified atrial fibrillation: Secondary | ICD-10-CM | POA: Diagnosis not present

## 2017-06-06 DIAGNOSIS — Z5181 Encounter for therapeutic drug level monitoring: Secondary | ICD-10-CM

## 2017-06-06 LAB — POCT INR: INR: 2.3

## 2017-06-15 ENCOUNTER — Encounter: Payer: Self-pay | Admitting: Cardiovascular Disease

## 2017-06-15 ENCOUNTER — Ambulatory Visit (INDEPENDENT_AMBULATORY_CARE_PROVIDER_SITE_OTHER): Payer: Medicare Other | Admitting: Cardiovascular Disease

## 2017-06-15 VITALS — BP 144/66 | HR 57 | Ht 67.0 in | Wt 225.5 lb

## 2017-06-15 DIAGNOSIS — E785 Hyperlipidemia, unspecified: Secondary | ICD-10-CM

## 2017-06-15 DIAGNOSIS — I481 Persistent atrial fibrillation: Secondary | ICD-10-CM

## 2017-06-15 DIAGNOSIS — I5022 Chronic systolic (congestive) heart failure: Secondary | ICD-10-CM

## 2017-06-15 DIAGNOSIS — Z79899 Other long term (current) drug therapy: Secondary | ICD-10-CM | POA: Diagnosis not present

## 2017-06-15 DIAGNOSIS — I739 Peripheral vascular disease, unspecified: Secondary | ICD-10-CM | POA: Diagnosis not present

## 2017-06-15 DIAGNOSIS — I4819 Other persistent atrial fibrillation: Secondary | ICD-10-CM

## 2017-06-15 DIAGNOSIS — I10 Essential (primary) hypertension: Secondary | ICD-10-CM | POA: Diagnosis not present

## 2017-06-15 MED ORDER — AMIODARONE HCL 100 MG PO TABS: 100.0000 mg | ORAL_TABLET | Freq: Every day | ORAL | 5 refills | Status: DC

## 2017-06-15 NOTE — Progress Notes (Addendum)
Cardiology Office Note   Date:  06/15/2017   ID:  Meghan [name], DOB 11/01/1950, MRN [id number]  PCP:  Adline Potter, MD  Cardiologist:   Kathlyn Sacramento, MD  Endocrinologist: Dr. Atha Starks  Chief Complaint  Patient presents with  . Other    2-3 month follow up. Patietn denies chest pain and SOB. Meds reviewed verbally with patient.       History of Present Illness: Meghan [name] is a 67 y.o. female who presents for a followup visit regarding chronic systolic heart failure, coronary artery disease and persistent atrial fibrillation maintained in sinus rhythm with amiodarone. Other medical problems include hypertension, 2 diabetes,anemia, hyperlipidemia and carotid artery disease status post left carotid endarterectomy with previous CVA. She had worsening heart failure in 2018 in the setting of atrial fibrillation with rapid ventricular response.  She was treated with amiodarone with restoration of sinus rhythm.  She had a repeat echocardiogram in August, 2018 which showed severely reduced LV systolic function with an EF of 20-25%, mild mitral regurgitation, moderate to severe tricuspid regurgitation and moderate pulmonary hypertension with systolic pulmonary pressure around 50 mmHg. Right and left cardiac catheterization done in December 2018 showed significant one-vessel coronary artery disease with subtotal occlusion of the ostial and mid segment of the LAD with faint left to left collaterals.  There was mild to moderate left circumflex and RCA disease.  Right heart catheterization showed moderately elevated filling pressure with a wedge pressure of 23 mmHg, moderate pulmonary hypertension and mildly reduced cardiac output.  She was hospitalized in December for left foot cellulitis.  She had hyperkalemia in January with a potassium of 6.4 and worsening renal function with a creatinine of 1.95 and BUN of 62.  She went to the emergency room and was given lactulose.  Spironolactone and  lisinopril were discontinued.  It appears that amiodarone was also stopped for unclear reasons.  The patient has been doing reasonably well and denies any chest pain or worsening dyspnea.  No significant leg edema.  She continues to have a nonhealed ulcer on the left second toe.  She had previous amputation of the left big toe.  Past Medical History:  Diagnosis Date  . Carotid arterial disease ([place])    a. 02/2012 s/p L CEA.  . Coronary artery disease    a. 02/2012 Lexi MV: Anterior, anteroseptal, septal, apical infarct with mild peri-infarct ischemia, EF 41%- medically managed due to anemia; b. 03/2017 Cath: LM nl, LAD 99ost/p/m - faint L->L collaterals, RI 20ost, LCX 16m, RCA 20p, 87m-->Med Rx for chronic subtotal LAD dzs. Avoid R Radial access in future.  . 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 Hyperlipidemia   . Hypertension   . Ischemic cardiomyopathy    a. 06/2013 Echo: EF 35-40%, glob HK, mildly dil LA, mild LVH, mild TR, mild to mod MR; b. 07/2016 Echo: EF 25-30%, sev mid-apicalanteroseptal, ant, apical HK, mild MR, sev dil LA, mod dil RA, mod TR, PASP 73mmHg; c. 11/2016 Echo: EF 20-25%, antsept DK, Gr2 DD, mild MR, mod dil LA/RV/RA, mod red RV fxn, mod-sev TR, PASP 40-65mmHg.  . Morbid obesity ([place])   . Normocytic anemia   . Persistent atrial fibrillation (HCC)    a. CHA2DS2VASc = 7-->coumadin; maintaining sinus on amio.  . Stroke [facility]) 2013    Past Surgical History:  Procedure Laterality Date  . AMPUTATION TOE Left 09/22/2016   Procedure: AMPUTATION TOE;  Surgeon: Albertine Patricia, DPM;  Location: [facility]  ORS;  Service: Podiatry;  Laterality: Left;  . CARDIOVERSION N/A 07/28/2016   Procedure: CARDIOVERSION;  Surgeon: Wellington Hampshire, MD;  Location: [facility] ORS;  Service: Cardiovascular;  Laterality: N/A;  . CARDIOVERSION N/A 08/07/2016   Procedure: CARDIOVERSION;  Surgeon: Wellington Hampshire, MD;  Location: [facility] ORS;  Service: Cardiovascular;  Laterality: N/A;  .  CAROTID ENDARTERECTOMY     [facility]; Dr. Lucky Cowboy  . CHOLECYSTECTOMY    . LEG SURGERY     right;infection  . RIGHT/LEFT HEART CATH AND CORONARY ANGIOGRAPHY Bilateral 03/12/2017   Procedure: RIGHT/LEFT HEART CATH AND CORONARY ANGIOGRAPHY;  Surgeon: Wellington Hampshire, MD;  Location: [place] CV LAB;  Service: Cardiovascular;  Laterality: Bilateral;  . TONSILLECTOMY       Current Outpatient Medications  Medication Sig Dispense Refill  . atorvastatin (LIPITOR) 10 MG tablet TAKE ONE TABLET BY MOUTH ONCE DAILY (Patient taking differently: TAKE ONE TABLET BY MOUTH ONCE DAILY AT NOON) 90 tablet 3  . Coal Tar Extract [phone] PSORIASIS MEDICATED EX) Apply 1 application topically daily as needed (psoriasis).    . Ferrous Sulfate (IRON) 325 (65 Fe) MG TABS Take 1 tablet (325 mg total) by mouth daily. (Patient taking differently: Take 5 tablets by mouth daily. ) 30 each 0  . furosemide (LASIX) 40 MG tablet Take 0.5 tablets (20 mg total) by mouth daily. 90 tablet 3  . glipiZIDE (GLUCOTROL) 10 MG tablet Take 10 mg by mouth 2 (two) times daily.     . metoprolol succinate (TOPROL-XL) 100 MG 24 hr tablet TAKE 1 TABLET BY MOUTH ONCE DAILY TAKE  WITH  OR  IMMEDIATELY  FOLLOWING  A  MEAL 90 tablet 2  . warfarin (COUMADIN) 5 MG tablet TAKE 1 TABLET BY MOUTH ONCE DAILY OR  AS  DIRECTED  BY  COUMADIN  CLINIC 30 tablet 3   No current facility-administered medications for this visit.     Allergies:   Patient has no known allergies.    Social History:  The patient  reports that she has quit smoking. Her smoking use included cigarettes. She has a 2.50 pack-year smoking history. she has never used smokeless tobacco. She reports that she does not drink alcohol or use drugs.   Family History:  The patient's family history includes Heart disease in her mother.    ROS:  Please see the history of present illness.   Otherwise, review of systems are positive for none.   All other systems are reviewed and negative.     PHYSICAL EXAM: VS:  BP (!) 144/66 (BP Location: Right Arm, Patient Position: Sitting, Cuff Size: Normal)   Pulse (!) 57   Ht 5\' 7"  (1.702 m)   Wt 225 lb 8 oz (102.3 kg)   BMI 35.32 kg/m  , BMI Body mass index is 35.32 kg/m. GEN: Well nourished, well developed, in no acute distress  HEENT: normal  Neck: No JVD, carotid bruits, or masses Cardiac:RRR; no murmurs, rubs, or gallops, trace bilateral leg edema  Respiratory: Few bibasilar crackles, normal work of breathing GI: soft, nontender, mild abdominal distention, + BS MS: no deformity or atrophy  Skin: warm and dry, no rash Neuro:  Strength and sensation are intact Psych: euthymic mood, full affect Vascular: Left dorsalis pedis is normal.  Posterior tibial is not palpable.   EKG:  EKG is ordered today. The ekg ordered today demonstrates sinus bradycardia with minimal LVH.  Old septal infarct with lateral T wave changes suggestive of ischemia.  Recent Labs: 07/26/2016:  B Natriuretic Peptide 1,062.0 09/21/2016: Magnesium 1.3 12/27/2016: TSH 5.780 04/28/2017: ALT 79; Hemoglobin 12.8; Platelets 207 05/01/2017: BUN 52; Creatinine, Ser 1.53; Potassium 5.2; Sodium 135    Lipid Panel    Component Value Date/Time   CHOL 67 (L) 12/27/2016 0826   CHOL 165 02/14/2012 0410   TRIG 45 12/27/2016 0826   TRIG 168 02/14/2012 0410   HDL 23 (L) 12/27/2016 0826   HDL 31 (L) 02/14/2012 0410   CHOLHDL 2.9 12/27/2016 0826   VLDL 34 02/14/2012 0410   LDLCALC 35 12/27/2016 0826   LDLCALC 100 02/14/2012 0410      Wt Readings from Last 3 Encounters:  06/15/17 225 lb 8 oz (102.3 kg)  04/28/17 213 lb (96.6 kg)  03/30/17 217 lb 6.4 oz (98.6 kg)       ASSESSMENT AND PLAN:  1. Chronic systolic heart failure: Her weight increased 12 pounds since January but it appears that she was volume overloaded in January.  Thus, this might be close to her dry weight.  Clinically she appears to be euvolemic.   Continue current dose of furosemide 20 mg  daily. Continue Toprol but I might consider switching to carvedilol. She is currently not on lisinopril or spironolactone which were both discontinued due to acute on chronic renal failure with hyperkalemia.  I suspect she was likely volume depleted at that time. I am going to repeat her labs today and consider starting her on Entresto.  If that is not possible, she will need a combination of hydralazine and Imdur.  2. Persistent atrial fibrillation: High risk for recurrent atrial fibrillation without amiodarone.  Thus, I resumed the medication at 100 mg once daily.  She is on long-term anticoagulation with warfarin.  3. Essential hypertension: Blood pressure is elevated off lisinopril and spironolactone.  4. Coronary artery disease involving native coronary arteries with other forms of angina: Subtotal occlusion of the LAD with collaterals.  Continue medical therapy.  5. Hyperlipidemia: Continue treatment with atorvastatin.  Most recent LDL was 35 which is at target.  6.  Slow healing ulcer on the left second toe.  Dorsalis pedis pulses normal posterior tibial pulses not palpable.  I requested lower extremity arterial Doppler given multiple risk factors for peripheral arterial disease.  Disposition:   FU with me in 1 months  Signed,  Kathlyn Sacramento, MD  06/15/2017 11:22 AM    [place]

## 2017-06-15 NOTE — Patient Instructions (Signed)
Medication Instructions:  Your physician has recommended you make the following change in your medication: RESUME amiodarone 100mg  once daily (this is a lower dose that what you had been taking)   Labwork: Liver profile and BMET today  Testing/Procedures: Your physician has requested that you have a lower extremity arterial duplex. During this test, ultrasound is used to evaluate arterial blood flow in the legs. Allow one hour for this exam. There are no restrictions or special instructions.   Follow-Up: Your physician recommends that you schedule a follow-up appointment in: 1 month with Dr. Fletcher Anon.    Any Other Special Instructions Will Be Listed Below (If Applicable).     If you need a refill on your cardiac medications before your next appointment, please call your pharmacy.

## 2017-06-16 LAB — HEPATIC FUNCTION PANEL
ALBUMIN: 3.5 g/dL — AB (ref 3.6–4.8)
ALT: 27 IU/L (ref 0–32)
AST: 30 IU/L (ref 0–40)
Alkaline Phosphatase: 221 IU/L — ABNORMAL HIGH (ref 39–117)
Bilirubin Total: 0.5 mg/dL (ref 0.0–1.2)
Bilirubin, Direct: 0.16 mg/dL (ref 0.00–0.40)
TOTAL PROTEIN: 8.4 g/dL (ref 6.0–8.5)

## 2017-06-16 LAB — BASIC METABOLIC PANEL
BUN/Creatinine Ratio: 20 (ref 12–28)
BUN: 24 mg/dL (ref 8–27)
CALCIUM: 8.8 mg/dL (ref 8.7–10.3)
CO2: 28 mmol/L (ref 20–29)
CREATININE: 1.19 mg/dL — AB (ref 0.57–1.00)
Chloride: 94 mmol/L — ABNORMAL LOW (ref 96–106)
GFR calc Af Amer: 54 mL/min/{1.73_m2} — ABNORMAL LOW (ref >59)
GFR, EST NON AFRICAN AMERICAN: 47 mL/min/{1.73_m2} — AB (ref >59)
GLUCOSE: 280 mg/dL — AB (ref 65–99)
POTASSIUM: 4.5 mmol/L (ref 3.5–5.2)
SODIUM: 136 mmol/L (ref 134–144)

## 2017-06-18 ENCOUNTER — Other Ambulatory Visit: Payer: Self-pay | Admitting: Cardiovascular Disease

## 2017-06-18 DIAGNOSIS — L97921 Non-pressure chronic ulcer of unspecified part of left lower leg limited to breakdown of skin: Secondary | ICD-10-CM

## 2017-06-19 ENCOUNTER — Telehealth: Payer: Self-pay | Admitting: *Deleted

## 2017-06-19 MED ORDER — AMIODARONE HCL 200 MG PO TABS: 100.0000 mg | ORAL_TABLET | Freq: Every day | ORAL | 3 refills | Status: DC

## 2017-06-19 NOTE — Telephone Encounter (Signed)
Received fax from [facility] regarding Amiodarone Rx saying "200 mg are $9 for 30 tabs. Could this be changed to 200 mg 1/2 QD."  Rx for Amiodarone 200 mg tablets, take 100 mg (1/2 tablet) by mouth once a day sent to pharmacy.

## 2017-06-20 ENCOUNTER — Other Ambulatory Visit: Payer: Self-pay

## 2017-06-20 DIAGNOSIS — Z79899 Other long term (current) drug therapy: Secondary | ICD-10-CM

## 2017-06-20 MED ORDER — SACUBITRIL-VALSARTAN 24-26 MG PO TABS: 1.0000 | ORAL_TABLET | Freq: Two times a day (BID) | ORAL | 3 refills | Status: DC

## 2017-06-21 ENCOUNTER — Telehealth: Payer: Self-pay | Admitting: Cardiovascular Disease

## 2017-06-21 NOTE — Telephone Encounter (Signed)
Fax received from [employer] in [place] stating that the patient's entresto will require a PA.  Med. RX- [employer] Member ID: [id number] Group ID: [id number] [phone]  PA initiated through Cover My Meds. Waiting for determination.

## 2017-06-22 NOTE — Telephone Encounter (Signed)
Per review of CoverMyMeds- PA approved for the patient's entresto.  [facility] pharmacy notified.  They did try to submit while I was on the phone and the RX did go through.

## 2017-06-25 ENCOUNTER — Telehealth: Payer: Self-pay | Admitting: Cardiovascular Disease

## 2017-06-25 MED ORDER — WARFARIN SODIUM 5 MG PO TABS: ORAL_TABLET | ORAL | 3 refills | Status: DC

## 2017-06-25 NOTE — Telephone Encounter (Signed)
Refill sent.

## 2017-06-25 NOTE — Telephone Encounter (Signed)
°*  STAT* If patient is at the pharmacy, call can be transferred to refill team.   1. Which medications need to be refilled? (please list name of each medication and dose if known) warfarin   2. Which pharmacy/location (including street and city if local pharmacy) is medication to be sent to?[facility] in [place]   3. Do they need a 30 day or 90 day supply? [place]

## 2017-06-26 NOTE — Telephone Encounter (Signed)
Received fax from [facility], Member ID [id number] Delene Loll 24-26mg  has been approved through 06/23/2019

## 2017-07-02 ENCOUNTER — Ambulatory Visit (INDEPENDENT_AMBULATORY_CARE_PROVIDER_SITE_OTHER): Payer: Medicare Other

## 2017-07-02 DIAGNOSIS — L97921 Non-pressure chronic ulcer of unspecified part of left lower leg limited to breakdown of skin: Secondary | ICD-10-CM | POA: Diagnosis not present

## 2017-07-04 ENCOUNTER — Other Ambulatory Visit: Payer: Self-pay

## 2017-07-04 ENCOUNTER — Ambulatory Visit (INDEPENDENT_AMBULATORY_CARE_PROVIDER_SITE_OTHER): Payer: Medicare Other

## 2017-07-04 ENCOUNTER — Other Ambulatory Visit (INDEPENDENT_AMBULATORY_CARE_PROVIDER_SITE_OTHER): Payer: Medicare Other

## 2017-07-04 DIAGNOSIS — Z79899 Other long term (current) drug therapy: Secondary | ICD-10-CM

## 2017-07-04 DIAGNOSIS — I4891 Unspecified atrial fibrillation: Secondary | ICD-10-CM

## 2017-07-04 DIAGNOSIS — Z5181 Encounter for therapeutic drug level monitoring: Secondary | ICD-10-CM | POA: Diagnosis not present

## 2017-07-04 LAB — POCT INR: INR: 1.6

## 2017-07-04 NOTE — Patient Instructions (Signed)
Please take 2 pills today, 1.5 tomorrow, then continue dosage of 1 pill every day except 1.5 pills on Mondays, Wednesdays and Fridays. Please be consistent with your greens intake.   Recheck in 4 weeks.

## 2017-07-05 LAB — BASIC METABOLIC PANEL
BUN / CREAT RATIO: 22 (ref 12–28)
BUN: 26 mg/dL (ref 8–27)
CHLORIDE: 102 mmol/L (ref 96–106)
CO2: 23 mmol/L (ref 20–29)
CREATININE: 1.17 mg/dL — AB (ref 0.57–1.00)
Calcium: 9.2 mg/dL (ref 8.7–10.3)
GFR calc Af Amer: 55 mL/min/{1.73_m2} — ABNORMAL LOW (ref >59)
GFR calc non Af Amer: 48 mL/min/{1.73_m2} — ABNORMAL LOW (ref >59)
GLUCOSE: 231 mg/dL — AB (ref 65–99)
Potassium: 4.6 mmol/L (ref 3.5–5.2)
SODIUM: 141 mmol/L (ref 134–144)

## 2017-07-11 ENCOUNTER — Other Ambulatory Visit: Payer: Self-pay

## 2017-07-11 MED ORDER — SACUBITRIL-VALSARTAN 24-26 MG PO TABS: 1.0000 | ORAL_TABLET | Freq: Two times a day (BID) | ORAL | 11 refills | Status: DC

## 2017-07-18 ENCOUNTER — Other Ambulatory Visit: Payer: Self-pay

## 2017-07-18 MED ORDER — SACUBITRIL-VALSARTAN 24-26 MG PO TABS: 1.0000 | ORAL_TABLET | Freq: Two times a day (BID) | ORAL | 11 refills | Status: DC

## 2017-07-20 ENCOUNTER — Ambulatory Visit: Payer: Medicare Other | Admitting: Cardiovascular Disease

## 2017-07-25 ENCOUNTER — Telehealth: Payer: Self-pay | Admitting: Cardiovascular Disease

## 2017-07-25 NOTE — Telephone Encounter (Signed)
Patient daughter dropped of [employer] Patient Assistance Forms to be completed Placed in Nurse Box

## 2017-07-25 NOTE — Telephone Encounter (Signed)
Placed in Dr. Tyrell Antonio basket for completion and signature.

## 2017-07-27 ENCOUNTER — Other Ambulatory Visit: Payer: Self-pay

## 2017-07-27 MED ORDER — SACUBITRIL-VALSARTAN 24-26 MG PO TABS: 1.0000 | ORAL_TABLET | Freq: Two times a day (BID) | ORAL | 11 refills | Status: DC

## 2017-07-27 NOTE — Telephone Encounter (Signed)
[employer] Patient Assistance forms faxed to [phone]. Confirmation fax received.

## 2017-08-01 ENCOUNTER — Ambulatory Visit (INDEPENDENT_AMBULATORY_CARE_PROVIDER_SITE_OTHER): Payer: Medicare Other

## 2017-08-01 ENCOUNTER — Other Ambulatory Visit: Payer: Self-pay

## 2017-08-01 DIAGNOSIS — I4891 Unspecified atrial fibrillation: Secondary | ICD-10-CM

## 2017-08-01 DIAGNOSIS — Z5181 Encounter for therapeutic drug level monitoring: Secondary | ICD-10-CM | POA: Diagnosis not present

## 2017-08-01 LAB — POCT INR: INR: 1.7

## 2017-08-01 MED ORDER — WARFARIN SODIUM 5 MG PO TABS: ORAL_TABLET | ORAL | 3 refills | Status: DC

## 2017-08-01 NOTE — Patient Instructions (Signed)
Please take 2 pills today, then STAR NEW DOSAGE of 1.5 pills every day except 1 pill on Mondays, Wednesdays and Fridays. Please be consistent with your greens intake.   Recheck in 2 weeks.

## 2017-08-10 NOTE — Telephone Encounter (Signed)
Received letter from [place] ([employer]) that patient may be eligible for assistance through Medicare LIS [phone] as well as be able to receive medication from [employer] until this is determined. Patient is to call Medicare LIS and if denied, would need to provide denial letter to [employer] for reconsideration of assistance from them.  S/w patient's daughter, ok per DPR. She verbalized understanding of the process and took the Medicare LIS number to call. Stated her mom just received a shipment of the medicine yesterday and was doing well. They will call us back if needed for further assistance.

## 2017-08-22 ENCOUNTER — Ambulatory Visit (INDEPENDENT_AMBULATORY_CARE_PROVIDER_SITE_OTHER): Payer: Medicare Other

## 2017-08-22 DIAGNOSIS — Z5181 Encounter for therapeutic drug level monitoring: Secondary | ICD-10-CM | POA: Diagnosis not present

## 2017-08-22 DIAGNOSIS — I4891 Unspecified atrial fibrillation: Secondary | ICD-10-CM

## 2017-08-22 LAB — POCT INR: INR: 2.2

## 2017-08-22 NOTE — Patient Instructions (Signed)
Please continue dosage of 1.5 pills every day except 1 pill on Mondays, Wednesdays and Fridays. Please be consistent with your greens intake.   Recheck in 3 weeks.

## 2017-09-07 ENCOUNTER — Ambulatory Visit (INDEPENDENT_AMBULATORY_CARE_PROVIDER_SITE_OTHER): Payer: Medicare Other | Admitting: Cardiovascular Disease

## 2017-09-07 VITALS — BP 100/60 | HR 54 | Ht 67.0 in | Wt 228.2 lb

## 2017-09-07 DIAGNOSIS — I2510 Atherosclerotic heart disease of native coronary artery without angina pectoris: Secondary | ICD-10-CM | POA: Diagnosis not present

## 2017-09-07 DIAGNOSIS — I481 Persistent atrial fibrillation: Secondary | ICD-10-CM

## 2017-09-07 DIAGNOSIS — I10 Essential (primary) hypertension: Secondary | ICD-10-CM

## 2017-09-07 DIAGNOSIS — I4819 Other persistent atrial fibrillation: Secondary | ICD-10-CM

## 2017-09-07 DIAGNOSIS — I5022 Chronic systolic (congestive) heart failure: Secondary | ICD-10-CM

## 2017-09-07 DIAGNOSIS — E785 Hyperlipidemia, unspecified: Secondary | ICD-10-CM | POA: Diagnosis not present

## 2017-09-07 MED ORDER — CARVEDILOL 6.25 MG PO TABS: 6.2500 mg | ORAL_TABLET | Freq: Two times a day (BID) | ORAL | 6 refills | Status: DC

## 2017-09-07 NOTE — Patient Instructions (Signed)
Medication Instructions: - Your physician has recommended you make the following change in your medication:   1) STOP metoprolol 2) START coreg (carvedilol) 6.25 mg- take 1 tablet by mouth twice daily  Labwork: - Your physician recommends that you have lab work today: BMP   Procedures/Testing: - none ordered  Follow-Up: - Your physician recommends that you schedule a follow-up appointment in: 2 months with Dr. Fletcher Anon.    Any Additional Special Instructions Will Be Listed Below (If Applicable).     If you need a refill on your cardiac medications before your next appointment, please call your pharmacy.

## 2017-09-07 NOTE — Progress Notes (Signed)
Cardiology Office Note   Date:  09/07/2017   ID:  Meghan [name], DOB [birth year], MRN [id number]  PCP:  Patient, No Pcp Per  Cardiologist:   Kathlyn Sacramento, MD  Endocrinologist: Dr. Atha Starks  Chief Complaint  Patient presents with  . other    1 month f/u no complaints today. Meds reviewed verbally with pt.       History of Present Illness: Meghan [name] is a 67 y.o. female who presents for a followup visit regarding chronic systolic heart failure, coronary artery disease and persistent atrial fibrillation maintained in sinus rhythm with amiodarone. Other medical problems include hypertension, 2 diabetes,anemia, hyperlipidemia and carotid artery disease status post left carotid endarterectomy with previous CVA. She had worsening heart failure in 2018 in the setting of atrial fibrillation with rapid ventricular response.  She was treated with amiodarone with restoration of sinus rhythm.  She had a repeat echocardiogram in August, 2018 which showed severely reduced LV systolic function with an EF of 20-25%, mild mitral regurgitation, moderate to severe tricuspid regurgitation and moderate pulmonary hypertension with systolic pulmonary pressure around 50 mmHg. Right and left cardiac catheterization done in December 2018 showed significant one-vessel coronary artery disease with subtotal occlusion of the ostial and mid segment of the LAD with faint left to left collaterals.  There was mild to moderate left circumflex and RCA disease.  Right heart catheterization showed moderately elevated filling pressure with a wedge pressure of 23 mmHg, moderate pulmonary hypertension and mildly reduced cardiac output. She had significant hyperkalemia in the setting of renal failure and spironolactone was discontinued.  She is currently on Entresto and has been tolerating the medication with no issues.  She has been doing reasonably well with no recent chest pain or worsening dyspnea.  No significant leg  edema.  Unfortunately, her 44 year old son died recently and she has been grieving.  Past Medical History:  Diagnosis Date  . Carotid arterial disease ([place])    a. 02/2012 s/p L CEA.  . Coronary artery disease    a. 02/2012 Lexi MV: Anterior, anteroseptal, septal, apical infarct with mild peri-infarct ischemia, EF 41%- medically managed due to anemia; b. 03/2017 Cath: LM nl, LAD 99ost/p/m - faint L->L collaterals, RI 20ost, LCX 78m, RCA 20p, 30m-->Med Rx for chronic subtotal LAD dzs. Avoid R Radial access in future.  . 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 Hyperlipidemia   . Hypertension   . Ischemic cardiomyopathy    a. 06/2013 Echo: EF 35-40%, glob HK, mildly dil LA, mild LVH, mild TR, mild to mod MR; b. 07/2016 Echo: EF 25-30%, sev mid-apicalanteroseptal, ant, apical HK, mild MR, sev dil LA, mod dil RA, mod TR, PASP 61mmHg; c. 11/2016 Echo: EF 20-25%, antsept DK, Gr2 DD, mild MR, mod dil LA/RV/RA, mod red RV fxn, mod-sev TR, PASP 40-78mmHg.  . Morbid obesity ([place])   . Normocytic anemia   . Persistent atrial fibrillation (HCC)    a. CHA2DS2VASc = 7-->coumadin; maintaining sinus on amio.  . Stroke [facility]) 2013    Past Surgical History:  Procedure Laterality Date  . AMPUTATION TOE Left 09/22/2016   Procedure: AMPUTATION TOE;  Surgeon: Albertine Patricia, DPM;  Location: [facility] ORS;  Service: Podiatry;  Laterality: Left;  . CARDIOVERSION N/A 07/28/2016   Procedure: CARDIOVERSION;  Surgeon: Wellington Hampshire, MD;  Location: [facility] ORS;  Service: Cardiovascular;  Laterality: N/A;  . CARDIOVERSION N/A 08/07/2016   Procedure: CARDIOVERSION;  Surgeon: Wellington Hampshire, MD;  Location: [facility] ORS;  Service: Cardiovascular;  Laterality: N/A;  . CAROTID ENDARTERECTOMY     [facility]; Dr. Lucky Cowboy  . CHOLECYSTECTOMY    . LEG SURGERY     right;infection  . RIGHT/LEFT HEART CATH AND CORONARY ANGIOGRAPHY Bilateral 03/12/2017   Procedure: RIGHT/LEFT HEART CATH AND CORONARY ANGIOGRAPHY;  Surgeon: Wellington Hampshire, MD;  Location: [place] [facility];  Service: Cardiovascular;  Laterality: Bilateral;  . TONSILLECTOMY       Current Outpatient Medications  Medication Sig Dispense Refill  . amiodarone (PACERONE) 200 MG tablet Take 0.5 tablets (100 mg total) by mouth daily. 45 tablet 3  . atorvastatin (LIPITOR) 10 MG tablet TAKE ONE TABLET BY MOUTH ONCE DAILY (Patient taking differently: TAKE ONE TABLET BY MOUTH ONCE DAILY AT NOON) 90 tablet 3  . Ferrous Sulfate (IRON) 325 (65 Fe) MG TABS Take 1 tablet (325 mg total) by mouth daily. (Patient taking differently: Take 5 tablets by mouth daily. ) 30 each 0  . furosemide (LASIX) 40 MG tablet Take 0.5 tablets (20 mg total) by mouth daily. 90 tablet 3  . glipiZIDE (GLUCOTROL) 10 MG tablet Take 10 mg by mouth 2 (two) times daily.     . metoprolol succinate (TOPROL-XL) 100 MG 24 hr tablet TAKE 1 TABLET BY MOUTH ONCE DAILY TAKE  WITH  OR  IMMEDIATELY  FOLLOWING  A  MEAL 90 tablet 2  . sacubitril-valsartan (ENTRESTO) 24-26 MG Take 1 tablet by mouth 2 (two) times daily. 60 tablet 11  . warfarin (COUMADIN) 5 MG tablet TAKE  AS  DIRECTED  BY  COUMADIN  CLINIC 45 tablet 3   No current facility-administered medications for this visit.     Allergies:   Patient has no known allergies.    Social History:  The patient  reports that she has quit smoking. Her smoking use included cigarettes. She has a 2.50 pack-year smoking history. She has never used smokeless tobacco. She reports that she does not drink alcohol or use drugs.   Family History:  The patient's family history includes Heart disease in her mother.    ROS:  Please see the history of present illness.   Otherwise, review of systems are positive for none.   All other systems are reviewed and negative.    PHYSICAL EXAM: VS:  BP 100/60 (BP Location: Left Arm, Patient Position: Sitting, Cuff Size: Large)   Pulse (!) 54   Ht 5\' 7"  (1.702 m)   Wt 228 lb 4 oz (103.5 kg)   BMI 35.75 kg/m  , BMI Body  mass index is 35.75 kg/m. GEN: Well nourished, well developed, in no acute distress  HEENT: normal  Neck: No JVD, carotid bruits, or masses Cardiac:RRR; no murmurs, rubs, or gallops, trace bilateral leg edema  Respiratory: Few bibasilar crackles, normal work of breathing GI: soft, nontender, mild abdominal distention, + BS MS: no deformity or atrophy  Skin: warm and dry, no rash Neuro:  Strength and sensation are intact Psych: euthymic mood, full affect   EKG:  EKG is ordered today. The ekg ordered today demonstrates sinus bradycardia with first-degree AV block.  LVH with repolarization abnormalities.   Recent Labs: 09/21/2016: Magnesium 1.3 12/27/2016: TSH 5.780 04/28/2017: Hemoglobin 12.8; Platelets 207 06/15/2017: ALT 27 07/04/2017: BUN 26; Creatinine, Ser 1.17; Potassium 4.6; Sodium 141    Lipid Panel    Component Value Date/Time   CHOL 67 (L) 12/27/2016 0826   CHOL 165 02/14/2012 0410   TRIG 45 12/27/2016 0826  TRIG 168 02/14/2012 0410   HDL 23 (L) 12/27/2016 0826   HDL 31 (L) 02/14/2012 0410   CHOLHDL 2.9 12/27/2016 0826   VLDL 34 02/14/2012 0410   LDLCALC 35 12/27/2016 0826   LDLCALC 100 02/14/2012 0410      Wt Readings from Last 3 Encounters:  09/07/17 228 lb 4 oz (103.5 kg)  06/15/17 225 lb 8 oz (102.3 kg)  04/28/17 213 lb (96.6 kg)       ASSESSMENT AND PLAN:  1. Chronic systolic heart failure: She appears to be only slightly volume overloaded.  Weight increased 3 pounds since last visit in March.  I made no changes in her dose of furosemide.  Continue treatment with Entresto.  Not able to increase the dose due to relatively low blood pressure.  I elected to switch her from Toprol to carvedilol 6.25 mg twice daily and hopefully this will improve her sinus bradycardia.   Check basic metabolic profile today and if renal function and electrolytes are stable, we can consider resuming spironolactone again.  2. Persistent atrial fibrillation: She is maintaining in  sinus rhythm with low-dose amiodarone.  She is on long-term anticoagulation with warfarin.  3. Essential hypertension: Blood pressure is well controlled.  4. Coronary artery disease involving native coronary arteries with other forms of angina: Subtotal occlusion of the LAD with collaterals.  Continue medical therapy.  5. Hyperlipidemia: Continue treatment with atorvastatin.  Most recent LDL was 35 which is at target.    Disposition:   FU with me in 2 months  Signed,  Kathlyn Sacramento, MD  09/07/2017 11:23 AM    [place]

## 2017-09-08 LAB — BASIC METABOLIC PANEL
BUN/Creatinine Ratio: 27 (ref 12–28)
BUN: 43 mg/dL — ABNORMAL HIGH (ref 8–27)
CO2: 22 mmol/L (ref 20–29)
Calcium: 9.4 mg/dL (ref 8.7–10.3)
Chloride: 100 mmol/L (ref 96–106)
Creatinine, Ser: 1.59 mg/dL — ABNORMAL HIGH (ref 0.57–1.00)
GFR calc Af Amer: 38 mL/min/{1.73_m2} — ABNORMAL LOW (ref >59)
GFR calc non Af Amer: 33 mL/min/{1.73_m2} — ABNORMAL LOW (ref >59)
Glucose: 229 mg/dL — ABNORMAL HIGH (ref 65–99)
Potassium: 5.1 mmol/L (ref 3.5–5.2)
Sodium: 137 mmol/L (ref 134–144)

## 2017-09-12 ENCOUNTER — Ambulatory Visit (INDEPENDENT_AMBULATORY_CARE_PROVIDER_SITE_OTHER): Payer: Medicare Other

## 2017-09-12 ENCOUNTER — Telehealth: Payer: Self-pay | Admitting: Cardiovascular Disease

## 2017-09-12 DIAGNOSIS — I4891 Unspecified atrial fibrillation: Secondary | ICD-10-CM

## 2017-09-12 DIAGNOSIS — Z5181 Encounter for therapeutic drug level monitoring: Secondary | ICD-10-CM

## 2017-09-12 LAB — POCT INR: INR: 1.9 — AB (ref 2.0–3.0)

## 2017-09-12 NOTE — Telephone Encounter (Signed)
Results in Dr Tyrell Antonio basket awaiting review. Will call patient once reviewed by Dr Fletcher Anon.

## 2017-09-12 NOTE — Patient Instructions (Signed)
Please take extra 1/2 tablet tonight, then continue dosage of 1.5 pills every day except 1 pill on Mondays, Wednesdays and Fridays. Please be consistent with your greens intake.   Recheck in 3 weeks.

## 2017-09-12 NOTE — Telephone Encounter (Signed)
Patient in office for INR Patient checking on status of lab results from 5/31 Please call when results available

## 2017-10-10 ENCOUNTER — Ambulatory Visit (INDEPENDENT_AMBULATORY_CARE_PROVIDER_SITE_OTHER): Payer: Medicare Other

## 2017-10-10 DIAGNOSIS — Z5181 Encounter for therapeutic drug level monitoring: Secondary | ICD-10-CM

## 2017-10-10 DIAGNOSIS — I4891 Unspecified atrial fibrillation: Secondary | ICD-10-CM

## 2017-10-10 LAB — POCT INR: INR: 1.4 — AB (ref 2.0–3.0)

## 2017-10-10 NOTE — Patient Instructions (Signed)
Please take 2 tablets tonight and tomorrow, then START NEW DOSAGE of 1.5 pills every day except 1 pill on Mondays & Fridays. Please be consistent with your greens intake.   Recheck in 2 weeks.

## 2017-10-24 ENCOUNTER — Ambulatory Visit (INDEPENDENT_AMBULATORY_CARE_PROVIDER_SITE_OTHER): Payer: Medicare Other

## 2017-10-24 DIAGNOSIS — Z5181 Encounter for therapeutic drug level monitoring: Secondary | ICD-10-CM | POA: Diagnosis not present

## 2017-10-24 DIAGNOSIS — I4891 Unspecified atrial fibrillation: Secondary | ICD-10-CM

## 2017-10-24 LAB — POCT INR: INR: 1.9 — AB (ref 2.0–3.0)

## 2017-10-24 NOTE — Patient Instructions (Signed)
Please take 2 tablets tonight then continue dosage of 1.5 pills every day except 1 pill on Mondays & Fridays. Please be consistent with your greens intake.   Recheck in 2 weeks.

## 2017-11-01 ENCOUNTER — Telehealth: Payer: Self-pay | Admitting: Cardiovascular Disease

## 2017-11-01 NOTE — Telephone Encounter (Signed)
Patient calling to check dosage of amiodarone she is supposed to be taking .  Pharmacy says it is too soon for a refill but she is out of meds.  Patient is concerned that she may not be taking her meds correctly.

## 2017-11-01 NOTE — Telephone Encounter (Signed)
Returned the call to the patient. She stated that her pharmacy will not let her refill her amiodarone due to it not being time.  Call placed to the pharmacy. They stated that the patient picked up her amiodarone on June 8th yet the patient stated that she does not have it.  The pharmacist stated that they would fill it for her if she could not find her bottle. Patient has been made aware.

## 2017-11-14 ENCOUNTER — Ambulatory Visit (INDEPENDENT_AMBULATORY_CARE_PROVIDER_SITE_OTHER): Payer: Medicare Other

## 2017-11-14 DIAGNOSIS — Z5181 Encounter for therapeutic drug level monitoring: Secondary | ICD-10-CM | POA: Diagnosis not present

## 2017-11-14 DIAGNOSIS — I4891 Unspecified atrial fibrillation: Secondary | ICD-10-CM

## 2017-11-14 LAB — POCT INR: INR: 2.9 (ref 2.0–3.0)

## 2017-11-14 NOTE — Patient Instructions (Signed)
Please continue dosage of 1.5 pills every day except 1 pill on Mondays & Fridays. Please be consistent with your greens intake.   Recheck in 2 weeks.

## 2017-11-18 ENCOUNTER — Other Ambulatory Visit: Payer: Self-pay | Admitting: Cardiovascular Disease

## 2017-11-23 ENCOUNTER — Ambulatory Visit (INDEPENDENT_AMBULATORY_CARE_PROVIDER_SITE_OTHER): Payer: Medicare Other | Admitting: Cardiovascular Disease

## 2017-11-23 ENCOUNTER — Encounter: Payer: Self-pay | Admitting: Cardiovascular Disease

## 2017-11-23 VITALS — BP 136/75 | HR 70 | Ht 67.0 in | Wt 247.0 lb

## 2017-11-23 DIAGNOSIS — I10 Essential (primary) hypertension: Secondary | ICD-10-CM | POA: Diagnosis not present

## 2017-11-23 DIAGNOSIS — I2510 Atherosclerotic heart disease of native coronary artery without angina pectoris: Secondary | ICD-10-CM

## 2017-11-23 DIAGNOSIS — I481 Persistent atrial fibrillation: Secondary | ICD-10-CM

## 2017-11-23 DIAGNOSIS — I5022 Chronic systolic (congestive) heart failure: Secondary | ICD-10-CM | POA: Diagnosis not present

## 2017-11-23 DIAGNOSIS — I4819 Other persistent atrial fibrillation: Secondary | ICD-10-CM

## 2017-11-23 DIAGNOSIS — I25118 Atherosclerotic heart disease of native coronary artery with other forms of angina pectoris: Secondary | ICD-10-CM | POA: Diagnosis not present

## 2017-11-23 DIAGNOSIS — E785 Hyperlipidemia, unspecified: Secondary | ICD-10-CM

## 2017-11-23 MED ORDER — FUROSEMIDE 40 MG PO TABS: 40.0000 mg | ORAL_TABLET | Freq: Every day | ORAL | 3 refills | Status: DC

## 2017-11-23 NOTE — Patient Instructions (Signed)
Medication Instructions: INCREASE the Furosemide to 40 mg daily  If you need a refill on your cardiac medications before your next appointment, please call your pharmacy.   Labwork: Your provider would like for you to return in one week to have the following labs drawn: BMET. Please go to the [facility] entrance and check in at the front desk. You do not need an appointment.   Follow-Up: Your physician wants you to follow-up in 3 months with Dr. Fletcher Anon.   Thank you for choosing [facility] at [facility]!

## 2017-11-23 NOTE — Progress Notes (Signed)
Cardiology Office Note   Date:  11/23/2017   ID:  Meghan [name], DOB [birth year], MRN [id number]  PCP:  Patient, No Pcp Per  Cardiologist:   Kathlyn Sacramento, MD  Endocrinologist: Dr. Atha Starks  Chief Complaint  Patient presents with  . OTHER    2 month f/u refused chest pain/sob/edema/BP issues. Meds reviewed verbally with pt.      History of Present Illness: Meghan [name] is a 67 y.o. female who presents for a followup visit regarding chronic systolic heart failure, coronary artery disease and persistent atrial fibrillation maintained in sinus rhythm with amiodarone. Other medical problems include hypertension, 2 diabetes,anemia, hyperlipidemia and carotid artery disease status post left carotid endarterectomy with previous CVA. She had worsening heart failure in 2018 in the setting of atrial fibrillation with rapid ventricular response.  She was treated with amiodarone with restoration of sinus rhythm.  She had a repeat echocardiogram in August, 2018 which showed severely reduced LV systolic function with an EF of 20-25%, mild mitral regurgitation, moderate to severe tricuspid regurgitation and moderate pulmonary hypertension with systolic pulmonary pressure around 50 mmHg. Right and left cardiac catheterization done in December 2018 showed significant one-vessel coronary artery disease with subtotal occlusion of the ostial and mid segment of the LAD with faint left to left collaterals.  There was mild to moderate left circumflex and RCA disease.  Right heart catheterization showed moderately elevated filling pressure with a wedge pressure of 23 mmHg, moderate pulmonary hypertension and mildly reduced cardiac output. She had significant hyperkalemia in the setting of renal failure and spironolactone was discontinued.  She is currently on Entresto and has been tolerating the medication with no issues. During last visit, I switch metoprolol to carvedilol due to bradycardia. She has been  doing reasonably well from a cardiac standpoint.  However, she gained about 20 pounds since May.  She attributes this to excessive eating and poor diet choices.  She eats ice cream every day.  She denies leg swelling.  However, she did notice increased worsening dyspnea and some chest congestion.  Past Medical History:  Diagnosis Date  . Carotid arterial disease ([place])    a. 02/2012 s/p L CEA.  . Coronary artery disease    a. 02/2012 Lexi MV: Anterior, anteroseptal, septal, apical infarct with mild peri-infarct ischemia, EF 41%- medically managed due to anemia; b. 03/2017 Cath: LM nl, LAD 99ost/p/m - faint L->L collaterals, RI 20ost, LCX 9m, RCA 20p, 49m-->Med Rx for chronic subtotal LAD dzs. Avoid R Radial access in future.  . 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 Hyperlipidemia   . Hypertension   . Ischemic cardiomyopathy    a. 06/2013 Echo: EF 35-40%, glob HK, mildly dil LA, mild LVH, mild TR, mild to mod MR; b. 07/2016 Echo: EF 25-30%, sev mid-apicalanteroseptal, ant, apical HK, mild MR, sev dil LA, mod dil RA, mod TR, PASP 30mmHg; c. 11/2016 Echo: EF 20-25%, antsept DK, Gr2 DD, mild MR, mod dil LA/RV/RA, mod red RV fxn, mod-sev TR, PASP 40-71mmHg.  . Morbid obesity ([place])   . Normocytic anemia   . Persistent atrial fibrillation (HCC)    a. CHA2DS2VASc = 7-->coumadin; maintaining sinus on amio.  . Stroke [facility]) 2013    Past Surgical History:  Procedure Laterality Date  . AMPUTATION TOE Left 09/22/2016   Procedure: AMPUTATION TOE;  Surgeon: Albertine Patricia, DPM;  Location: [facility] ORS;  Service: Podiatry;  Laterality: Left;  . CARDIOVERSION N/A 07/28/2016   Procedure:  CARDIOVERSION;  Surgeon: Wellington Hampshire, MD;  Location: [facility] ORS;  Service: Cardiovascular;  Laterality: N/A;  . CARDIOVERSION N/A 08/07/2016   Procedure: CARDIOVERSION;  Surgeon: Wellington Hampshire, MD;  Location: [facility] ORS;  Service: Cardiovascular;  Laterality: N/A;  . CAROTID ENDARTERECTOMY     [facility]; Dr.  Lucky Cowboy  . CHOLECYSTECTOMY    . LEG SURGERY     right;infection  . RIGHT/LEFT HEART CATH AND CORONARY ANGIOGRAPHY Bilateral 03/12/2017   Procedure: RIGHT/LEFT HEART CATH AND CORONARY ANGIOGRAPHY;  Surgeon: Wellington Hampshire, MD;  Location: [place] [facility];  Service: Cardiovascular;  Laterality: Bilateral;  . TONSILLECTOMY       Current Outpatient Medications  Medication Sig Dispense Refill  . amiodarone (PACERONE) 200 MG tablet Take 0.5 tablets (100 mg total) by mouth daily. 45 tablet 3  . atorvastatin (LIPITOR) 10 MG tablet TAKE 1 TABLET BY MOUTH ONCE DAILY 90 tablet 0  . carvedilol (COREG) 6.25 MG tablet Take 1 tablet (6.25 mg total) by mouth 2 (two) times daily with a meal. 60 tablet 6  . Ferrous Sulfate (IRON) 325 (65 Fe) MG TABS Take 1 tablet (325 mg total) by mouth daily. 30 each 0  . furosemide (LASIX) 40 MG tablet Take 0.5 tablets (20 mg total) by mouth daily. 90 tablet 3  . glipiZIDE (GLUCOTROL) 10 MG tablet Take 10 mg by mouth 2 (two) times daily.     . sacubitril-valsartan (ENTRESTO) 24-26 MG Take 1 tablet by mouth 2 (two) times daily. 60 tablet 11  . warfarin (COUMADIN) 5 MG tablet TAKE  AS  DIRECTED  BY  COUMADIN  CLINIC 45 tablet 3   No current facility-administered medications for this visit.     Allergies:   Patient has no known allergies.    Social History:  The patient  reports that she has quit smoking. Her smoking use included cigarettes. She has a 2.50 pack-year smoking history. She has never used smokeless tobacco. She reports that she does not drink alcohol or use drugs.   Family History:  The patient's family history includes Heart disease in her mother.    ROS:  Please see the history of present illness.   Otherwise, review of systems are positive for none.   All other systems are reviewed and negative.    PHYSICAL EXAM: VS:  BP 136/75 (BP Location: Left Arm, Patient Position: Sitting, Cuff Size: Large)   Pulse 70   Ht 5\' 7"  (1.702 m)   Wt 247 lb (112  kg)   BMI 38.69 kg/m  , BMI Body mass index is 38.69 kg/m. GEN: Well nourished, well developed, in no acute distress  HEENT: normal  Neck: No JVD, carotid bruits, or masses Cardiac:RRR; no murmurs, rubs, or gallops, trace bilateral leg edema  Respiratory: Few bibasilar crackles, normal work of breathing GI: soft, nontender, mild abdominal distention, + BS MS: no deformity or atrophy  Skin: warm and dry, no rash Neuro:  Strength and sensation are intact Psych: euthymic mood, full affect   EKG:  EKG is ordered today. The ekg ordered today demonstrates normal sinus rhythm with LVH and nonspecific T wave changes.   Recent Labs: 12/27/2016: TSH 5.780 04/28/2017: Hemoglobin 12.8; Platelets 207 06/15/2017: ALT 27 09/07/2017: BUN 43; Creatinine, Ser 1.59; Potassium 5.1; Sodium 137    Lipid Panel    Component Value Date/Time   CHOL 67 (L) 12/27/2016 0826   CHOL 165 02/14/2012 0410   TRIG 45 12/27/2016 0826   TRIG 168 02/14/2012 0410  HDL 23 (L) 12/27/2016 0826   HDL 31 (L) 02/14/2012 0410   CHOLHDL 2.9 12/27/2016 0826   VLDL 34 02/14/2012 0410   LDLCALC 35 12/27/2016 0826   LDLCALC 100 02/14/2012 0410      Wt Readings from Last 3 Encounters:  11/23/17 247 lb (112 kg)  09/07/17 228 lb 4 oz (103.5 kg)  06/15/17 225 lb 8 oz (102.3 kg)       ASSESSMENT AND PLAN:  1. Chronic systolic heart failure: She is clearly volume overloaded with 20 pound weight gain since May.  Some of the weight gain is due to excessive calorie intake but she also appears to be volume overloaded.  I elected to increase furosemide to 40 mg once daily.  Continue treatment with carvedilol and Entresto.  I am not planning to resume spironolactone given underlying chronic kidney disease with GFR running around 35 with previous hyperkalemia on spironolactone.   Repeat basic metabolic profile in 1 week.  2. Persistent atrial fibrillation: She is maintaining in sinus rhythm with low-dose amiodarone.  She is on  long-term anticoagulation with warfarin.  3. Essential hypertension: Blood pressure is well controlled.  4. Coronary artery disease involving native coronary arteries with other forms of angina: Subtotal occlusion of the LAD with collaterals.  Continue medical therapy.  5. Hyperlipidemia: Continue treatment with atorvastatin.  Most recent LDL was 35 which is at target.    Disposition:   FU with me in 3 months  Signed,  Kathlyn Sacramento, MD  11/23/2017 8:38 AM    [place]

## 2017-11-28 ENCOUNTER — Ambulatory Visit (INDEPENDENT_AMBULATORY_CARE_PROVIDER_SITE_OTHER): Payer: Medicare Other

## 2017-11-28 ENCOUNTER — Other Ambulatory Visit (INDEPENDENT_AMBULATORY_CARE_PROVIDER_SITE_OTHER): Payer: Medicare Other

## 2017-11-28 DIAGNOSIS — I10 Essential (primary) hypertension: Secondary | ICD-10-CM

## 2017-11-28 DIAGNOSIS — I481 Persistent atrial fibrillation: Secondary | ICD-10-CM | POA: Diagnosis not present

## 2017-11-28 DIAGNOSIS — I4819 Other persistent atrial fibrillation: Secondary | ICD-10-CM

## 2017-11-28 DIAGNOSIS — Z5181 Encounter for therapeutic drug level monitoring: Secondary | ICD-10-CM

## 2017-11-28 DIAGNOSIS — I4891 Unspecified atrial fibrillation: Secondary | ICD-10-CM

## 2017-11-28 DIAGNOSIS — I5022 Chronic systolic (congestive) heart failure: Secondary | ICD-10-CM | POA: Diagnosis not present

## 2017-11-28 LAB — POCT INR: INR: 1.7 — AB (ref 2.0–3.0)

## 2017-11-28 NOTE — Patient Instructions (Addendum)
Please take 2 tablets today, then continue dosage of 1.5 pills every day except 1 pill on Mondays & Fridays. Please be consistent with your greens intake.   Recheck in 2 weeks.

## 2017-11-29 LAB — BASIC METABOLIC PANEL
BUN/Creatinine Ratio: 27 (ref 12–28)
BUN: 35 mg/dL — ABNORMAL HIGH (ref 8–27)
CALCIUM: 9.8 mg/dL (ref 8.7–10.3)
CHLORIDE: 100 mmol/L (ref 96–106)
CO2: 26 mmol/L (ref 20–29)
Creatinine, Ser: 1.3 mg/dL — ABNORMAL HIGH (ref 0.57–1.00)
GFR calc Af Amer: 48 mL/min/{1.73_m2} — ABNORMAL LOW (ref >59)
GFR calc non Af Amer: 42 mL/min/{1.73_m2} — ABNORMAL LOW (ref >59)
GLUCOSE: 276 mg/dL — AB (ref 65–99)
POTASSIUM: 4.7 mmol/L (ref 3.5–5.2)
Sodium: 140 mmol/L (ref 134–144)

## 2017-12-12 ENCOUNTER — Ambulatory Visit (INDEPENDENT_AMBULATORY_CARE_PROVIDER_SITE_OTHER): Payer: Medicare Other

## 2017-12-12 DIAGNOSIS — Z5181 Encounter for therapeutic drug level monitoring: Secondary | ICD-10-CM

## 2017-12-12 DIAGNOSIS — I4891 Unspecified atrial fibrillation: Secondary | ICD-10-CM

## 2017-12-12 LAB — POCT INR: INR: 2.2 (ref 2.0–3.0)

## 2017-12-12 NOTE — Patient Instructions (Signed)
Please continue dosage of 1.5 pills every day except 1 pill on Mondays & Fridays. Please be consistent with your greens intake.   Recheck in 3 weeks.

## 2017-12-18 ENCOUNTER — Other Ambulatory Visit: Payer: Self-pay | Admitting: Cardiovascular Disease

## 2017-12-18 NOTE — Telephone Encounter (Signed)
Please review for refill, Thanks !  

## 2018-01-02 ENCOUNTER — Ambulatory Visit (INDEPENDENT_AMBULATORY_CARE_PROVIDER_SITE_OTHER): Payer: Medicare Other

## 2018-01-02 ENCOUNTER — Other Ambulatory Visit: Payer: Self-pay | Admitting: Cardiovascular Disease

## 2018-01-02 DIAGNOSIS — Z5181 Encounter for therapeutic drug level monitoring: Secondary | ICD-10-CM | POA: Diagnosis not present

## 2018-01-02 DIAGNOSIS — I4891 Unspecified atrial fibrillation: Secondary | ICD-10-CM | POA: Diagnosis not present

## 2018-01-02 LAB — POCT INR: INR: 4.7 — AB (ref 2.0–3.0)

## 2018-01-02 NOTE — Patient Instructions (Signed)
Please hold coumadin tonight, take 1/2 tablet tomorrow, then continue dosage of 1.5 pills every day except 1 pill on Mondays & Fridays. Please be consistent with your greens intake.   Recheck in 2 weeks.

## 2018-01-16 ENCOUNTER — Ambulatory Visit (INDEPENDENT_AMBULATORY_CARE_PROVIDER_SITE_OTHER): Payer: Medicare Other

## 2018-01-16 DIAGNOSIS — Z5181 Encounter for therapeutic drug level monitoring: Secondary | ICD-10-CM | POA: Diagnosis not present

## 2018-01-16 DIAGNOSIS — I4891 Unspecified atrial fibrillation: Secondary | ICD-10-CM

## 2018-01-16 LAB — POCT INR: INR: 2.9 (ref 2.0–3.0)

## 2018-01-16 NOTE — Patient Instructions (Signed)
Please continue dosage of 1.5 pills every day except 1 pill on Mondays & Fridays. Please be consistent with your greens intake.   Recheck in 2 weeks.

## 2018-01-30 ENCOUNTER — Ambulatory Visit (INDEPENDENT_AMBULATORY_CARE_PROVIDER_SITE_OTHER): Payer: Medicare Other

## 2018-01-30 DIAGNOSIS — I4891 Unspecified atrial fibrillation: Secondary | ICD-10-CM | POA: Diagnosis not present

## 2018-01-30 DIAGNOSIS — Z5181 Encounter for therapeutic drug level monitoring: Secondary | ICD-10-CM | POA: Diagnosis not present

## 2018-01-30 LAB — POCT INR: INR: 3 (ref 2.0–3.0)

## 2018-01-30 NOTE — Patient Instructions (Signed)
Please continue dosage of 1.5 pills every day except 1 pill on Mondays & Fridays. Please be consistent with your greens intake.   Recheck in 2 weeks.

## 2018-02-12 ENCOUNTER — Other Ambulatory Visit: Payer: Self-pay | Admitting: Cardiovascular Disease

## 2018-02-13 ENCOUNTER — Ambulatory Visit (INDEPENDENT_AMBULATORY_CARE_PROVIDER_SITE_OTHER): Payer: Medicare Other

## 2018-02-13 DIAGNOSIS — Z5181 Encounter for therapeutic drug level monitoring: Secondary | ICD-10-CM | POA: Diagnosis not present

## 2018-02-13 DIAGNOSIS — I4891 Unspecified atrial fibrillation: Secondary | ICD-10-CM

## 2018-02-13 LAB — POCT INR: INR: 3.3 — AB (ref 2.0–3.0)

## 2018-02-13 NOTE — Patient Instructions (Signed)
Please skip coumadin tonight, then continue dosage of 1.5 pills every day except 1 pill on Mondays & Fridays. Please be consistent with your greens intake.   Recheck in 2 weeks.

## 2018-02-18 DIAGNOSIS — I10 Essential (primary) hypertension: Secondary | ICD-10-CM | POA: Diagnosis not present

## 2018-02-18 DIAGNOSIS — E1142 Type 2 diabetes mellitus with diabetic polyneuropathy: Secondary | ICD-10-CM | POA: Diagnosis not present

## 2018-02-18 DIAGNOSIS — E1169 Type 2 diabetes mellitus with other specified complication: Secondary | ICD-10-CM | POA: Diagnosis not present

## 2018-02-18 DIAGNOSIS — E785 Hyperlipidemia, unspecified: Secondary | ICD-10-CM | POA: Diagnosis not present

## 2018-02-18 DIAGNOSIS — E1159 Type 2 diabetes mellitus with other circulatory complications: Secondary | ICD-10-CM | POA: Diagnosis not present

## 2018-02-20 DIAGNOSIS — Z89412 Acquired absence of left great toe: Secondary | ICD-10-CM | POA: Diagnosis not present

## 2018-02-20 DIAGNOSIS — L97521 Non-pressure chronic ulcer of other part of left foot limited to breakdown of skin: Secondary | ICD-10-CM | POA: Diagnosis not present

## 2018-02-20 DIAGNOSIS — B351 Tinea unguium: Secondary | ICD-10-CM | POA: Diagnosis not present

## 2018-02-20 DIAGNOSIS — E11621 Type 2 diabetes mellitus with foot ulcer: Secondary | ICD-10-CM | POA: Diagnosis not present

## 2018-02-27 ENCOUNTER — Ambulatory Visit (INDEPENDENT_AMBULATORY_CARE_PROVIDER_SITE_OTHER): Payer: Medicare Other

## 2018-02-27 DIAGNOSIS — Z5181 Encounter for therapeutic drug level monitoring: Secondary | ICD-10-CM

## 2018-02-27 DIAGNOSIS — L97522 Non-pressure chronic ulcer of other part of left foot with fat layer exposed: Secondary | ICD-10-CM | POA: Diagnosis not present

## 2018-02-27 DIAGNOSIS — M2042 Other hammer toe(s) (acquired), left foot: Secondary | ICD-10-CM | POA: Diagnosis not present

## 2018-02-27 DIAGNOSIS — I4891 Unspecified atrial fibrillation: Secondary | ICD-10-CM

## 2018-02-27 LAB — POCT INR: INR: 2.4 (ref 2.0–3.0)

## 2018-02-27 NOTE — Patient Instructions (Signed)
Please continue dosage of 1.5 pills every day except 1 pill on Mondays & Fridays. Please be consistent with your greens intake.   Recheck in 3 weeks.

## 2018-02-28 ENCOUNTER — Encounter: Payer: Self-pay | Admitting: Cardiovascular Disease

## 2018-02-28 ENCOUNTER — Ambulatory Visit (INDEPENDENT_AMBULATORY_CARE_PROVIDER_SITE_OTHER): Payer: Medicare Other | Admitting: Cardiovascular Disease

## 2018-02-28 VITALS — BP 138/78 | HR 62 | Ht 67.0 in | Wt 255.0 lb

## 2018-02-28 DIAGNOSIS — I25118 Atherosclerotic heart disease of native coronary artery with other forms of angina pectoris: Secondary | ICD-10-CM | POA: Diagnosis not present

## 2018-02-28 DIAGNOSIS — I4819 Other persistent atrial fibrillation: Secondary | ICD-10-CM | POA: Diagnosis not present

## 2018-02-28 DIAGNOSIS — I10 Essential (primary) hypertension: Secondary | ICD-10-CM | POA: Diagnosis not present

## 2018-02-28 DIAGNOSIS — I2510 Atherosclerotic heart disease of native coronary artery without angina pectoris: Secondary | ICD-10-CM | POA: Diagnosis not present

## 2018-02-28 DIAGNOSIS — I5022 Chronic systolic (congestive) heart failure: Secondary | ICD-10-CM | POA: Diagnosis not present

## 2018-02-28 DIAGNOSIS — E785 Hyperlipidemia, unspecified: Secondary | ICD-10-CM | POA: Diagnosis not present

## 2018-02-28 MED ORDER — ATORVASTATIN CALCIUM 40 MG PO TABS: 40.0000 mg | ORAL_TABLET | Freq: Every day | ORAL | 1 refills | Status: DC

## 2018-02-28 MED ORDER — SACUBITRIL-VALSARTAN 49-51 MG PO TABS: 1.0000 | ORAL_TABLET | Freq: Two times a day (BID) | ORAL | 1 refills | Status: DC

## 2018-02-28 NOTE — Progress Notes (Signed)
Cardiology Office Note   Date:  02/28/2018   ID:  Meghan [name], DOB 08-12-1950, MRN [id number]  PCP:  Patient, No Pcp Per  Cardiologist:   Kathlyn Sacramento, MD  Endocrinologist: Dr. Atha Starks  Chief Complaint  Patient presents with  . other    3 mo follow up. Medications reviewed verbally.      History of Present Illness: Meghan [name] is a 67 y.o. female who presents for a followup visit regarding chronic systolic heart failure, coronary artery disease and persistent atrial fibrillation maintained in sinus rhythm with amiodarone. Other medical problems include hypertension, 2 diabetes,anemia, hyperlipidemia and carotid artery disease status post left carotid endarterectomy with previous CVA. She had worsening heart failure in 2018 in the setting of atrial fibrillation with rapid ventricular response.  She was treated with amiodarone with restoration of sinus rhythm.  She had a repeat echocardiogram in August, 2018 which showed severely reduced LV systolic function with an EF of 20-25%, mild mitral regurgitation, moderate to severe tricuspid regurgitation and moderate pulmonary hypertension with systolic pulmonary pressure around 50 mmHg. Right and left cardiac catheterization done in December 2018 showed significant one-vessel coronary artery disease with subtotal occlusion of the ostial and mid segment of the LAD with faint left to left collaterals.  There was mild to moderate left circumflex and RCA disease.  Right heart catheterization showed moderately elevated filling pressure with a wedge pressure of 23 mmHg, moderate pulmonary hypertension and mildly reduced cardiac output. She had significant hyperkalemia in the setting of renal failure and spironolactone was discontinued.  She is currently on Entresto and has been tolerating the medication with no issues.  During last visit, she was noted to have progressive weight gain which was felt to be partially due to excessive calorie  intake.  However, she was also mildly volume overloaded.  I increased furosemide to 40 mg once daily.  Unfortunately, she continues to gain weight although she has no worsening dyspnea or leg edema.  She denies any chest pain and overall has been doing reasonably well from a cardiac standpoint. Her diabetes continues to be uncontrolled with hemoglobin A1c above 14.  She is followed by endocrinology at [facility].  Recent labs showed slight improvement in renal function with a creatinine of 1.2.  Her TSH was elevated.   Past Medical History:  Diagnosis Date  . Carotid arterial disease ([place])    a. 02/2012 s/p L CEA.  . Coronary artery disease    a. 02/2012 Lexi MV: Anterior, anteroseptal, septal, apical infarct with mild peri-infarct ischemia, EF 41%- medically managed due to anemia; b. 03/2017 Cath: LM nl, LAD 99ost/p/m - faint L->L collaterals, RI 20ost, LCX 57m, RCA 20p, 55m-->Med Rx for chronic subtotal LAD dzs. Avoid R Radial access in future.  . 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 Hyperlipidemia   . Hypertension   . Ischemic cardiomyopathy    a. 06/2013 Echo: EF 35-40%, glob HK, mildly dil LA, mild LVH, mild TR, mild to mod MR; b. 07/2016 Echo: EF 25-30%, sev mid-apicalanteroseptal, ant, apical HK, mild MR, sev dil LA, mod dil RA, mod TR, PASP 18mmHg; c. 11/2016 Echo: EF 20-25%, antsept DK, Gr2 DD, mild MR, mod dil LA/RV/RA, mod red RV fxn, mod-sev TR, PASP 40-82mmHg.  . Morbid obesity ([place])   . Normocytic anemia   . Persistent atrial fibrillation    a. CHA2DS2VASc = 7-->coumadin; maintaining sinus on amio.  . Stroke [facility]) 2013  Past Surgical History:  Procedure Laterality Date  . AMPUTATION TOE Left 09/22/2016   Procedure: AMPUTATION TOE;  Surgeon: Albertine Patricia, DPM;  Location: [facility] ORS;  Service: Podiatry;  Laterality: Left;  . CARDIOVERSION N/A 07/28/2016   Procedure: CARDIOVERSION;  Surgeon: Wellington Hampshire, MD;  Location: [facility] ORS;  Service: Cardiovascular;   Laterality: N/A;  . CARDIOVERSION N/A 08/07/2016   Procedure: CARDIOVERSION;  Surgeon: Wellington Hampshire, MD;  Location: [facility] ORS;  Service: Cardiovascular;  Laterality: N/A;  . CAROTID ENDARTERECTOMY     [facility]; Dr. Lucky Cowboy  . CHOLECYSTECTOMY    . LEG SURGERY     right;infection  . RIGHT/LEFT HEART CATH AND CORONARY ANGIOGRAPHY Bilateral 03/12/2017   Procedure: RIGHT/LEFT HEART CATH AND CORONARY ANGIOGRAPHY;  Surgeon: Wellington Hampshire, MD;  Location: [place] CV LAB;  Service: Cardiovascular;  Laterality: Bilateral;  . TONSILLECTOMY       Current Outpatient Medications  Medication Sig Dispense Refill  . amiodarone (PACERONE) 200 MG tablet Take 0.5 tablets (100 mg total) by mouth daily. 45 tablet 3  . atorvastatin (LIPITOR) 10 MG tablet TAKE 1 TABLET BY MOUTH ONCE DAILY 90 tablet 0  . carvedilol (COREG) 6.25 MG tablet Take 1 tablet (6.25 mg total) by mouth 2 (two) times daily with a meal. 60 tablet 6  . Ferrous Sulfate (IRON) 325 (65 Fe) MG TABS Take 1 tablet (325 mg total) by mouth daily. 30 each 0  . furosemide (LASIX) 40 MG tablet Take 1 tablet (40 mg total) by mouth daily. (Patient taking differently: Take 20 mg by mouth daily. ) 90 tablet 3  . glipiZIDE (GLUCOTROL) 10 MG tablet Take 10 mg by mouth 2 (two) times daily.     . Insulin Degludec 200 UNIT/ML SOPN Start 26 units once a day.  Titrate to a max dose of 60 units daily    . sacubitril-valsartan (ENTRESTO) 24-26 MG Take 1 tablet by mouth 2 (two) times daily. 60 tablet 11  . warfarin (COUMADIN) 5 MG tablet TAKE  AS  DIRECTED  BY  COUMADIN  CLINIC 45 tablet 3   No current facility-administered medications for this visit.     Allergies:   Patient has no known allergies.    Social History:  The patient  reports that she has quit smoking. Her smoking use included cigarettes. She has a 2.50 pack-year smoking history. She has never used smokeless tobacco. She reports that she does not drink alcohol or use drugs.   Family History:   The patient's family history includes Heart disease in her mother.    ROS:  Please see the history of present illness.   Otherwise, review of systems are positive for none.   All other systems are reviewed and negative.    PHYSICAL EXAM: VS:  BP 138/78 (BP Location: Left Arm, Patient Position: Sitting, Cuff Size: Normal)   Pulse 62   Ht 5\' 7"  (1.702 m)   Wt 255 lb (115.7 kg)   BMI 39.94 kg/m  , BMI Body mass index is 39.94 kg/m. GEN: Well nourished, well developed, in no acute distress  HEENT: normal  Neck: No JVD, carotid bruits, or masses Cardiac:RRR; no murmurs, rubs, or gallops, trace bilateral leg edema  Respiratory: Few bibasilar crackles, normal work of breathing GI: soft, nontender, mild abdominal distention, + BS MS: no deformity or atrophy  Skin: warm and dry, no rash Neuro:  Strength and sensation are intact Psych: euthymic mood, full affect   EKG:  EKG is ordered today.  The ekg ordered today demonstrates sinus rhythm with sinus arrhythmia and first-degree AV block.  Old septal infarct.  Lateral T wave changes suggestive of ischemia.   Recent Labs: 04/28/2017: Hemoglobin 12.8; Platelets 207 06/15/2017: ALT 27 11/28/2017: BUN 35; Creatinine, Ser 1.30; Potassium 4.7; Sodium 140    Lipid Panel    Component Value Date/Time   CHOL 67 (L) 12/27/2016 0826   CHOL 165 02/14/2012 0410   TRIG 45 12/27/2016 0826   TRIG 168 02/14/2012 0410   HDL 23 (L) 12/27/2016 0826   HDL 31 (L) 02/14/2012 0410   CHOLHDL 2.9 12/27/2016 0826   VLDL 34 02/14/2012 0410   LDLCALC 35 12/27/2016 0826   LDLCALC 100 02/14/2012 0410      Wt Readings from Last 3 Encounters:  02/28/18 255 lb (115.7 kg)  11/23/17 247 lb (112 kg)  09/07/17 228 lb 4 oz (103.5 kg)       ASSESSMENT AND PLAN:  1. Chronic systolic heart failure: She is currently New York Heart Association class II.  In spite of progressive weight gain, I do not see clear evidence of volume overload.  I elected to keep her on  the same dose furosemide.    Continue treatment with carvedilol and Entresto.  I am not planning to resume spironolactone given underlying chronic kidney disease with previous hyperkalemia on spironolactone.   I increase Entresto today.  Continue same dose carvedilol.  2. Persistent atrial fibrillation: She is maintaining in sinus rhythm with low-dose amiodarone.  She is on long-term anticoagulation with warfarin.  Recent labs showed mildly elevated TSH.  This could be due to amiodarone but I am hesitant to stop the medication completely given the difficulty in treating her atrial fibrillation in the past and the significant worsening of heart failure when she was in A. fib.  Given her low EF and  underlying chronic kidney disease, we do not really have good alternatives to amiodarone.  3. Essential hypertension: Blood pressure is well controlled.  Entresto was increased as outlined above.  4. Coronary artery disease involving native coronary arteries with other forms of angina: Subtotal occlusion of the LAD with collaterals.  Continue medical therapy.  5. Hyperlipidemia: Continue treatment with atorvastatin.  I reviewed most recent labs which showed an LDL of 91.  I increase atorvastatin to 40 mg daily.  6.  Possible hypothyroidism: This could be responsible for some of her weight gain.  Consider small dose thyroid replacement therapy.  7.  Uncontrolled diabetes: She was recently started on Tresiba.  Given underlying chronic kidney disease, Vania Rea is not a good option.    Disposition:   FU with me in 3 months  Signed,  Kathlyn Sacramento, MD  02/28/2018 8:28 AM    [place]

## 2018-02-28 NOTE — Patient Instructions (Signed)
Medication Instructions:  INCREASE the Atorvastatin to 40 mg daily INCREASE the Entresto 49-51 twice daily  If you need a refill on your cardiac medications before your next appointment, please call your pharmacy.   Lab work: None ordered  Testing/Procedures: None ordered  Follow-Up: At [facility], you and your health needs are our priority.  As part of our continuing mission to provide you with exceptional heart care, we have created designated Provider Care Teams.  These Care Teams include your primary Cardiologist (physician) and Advanced Practice Providers (APPs -  Physician Assistants and Nurse Practitioners) who all work together to provide you with the care you need, when you need it. You will need a follow up appointment in 3 months.You may see Kathlyn Sacramento, MD or one of the following Advanced Practice Providers on your designated Care Team:   Murray Hodgkins, NP Christell Faith, PA-C . Marrianne Mood, PA-C

## 2018-03-13 DIAGNOSIS — L97522 Non-pressure chronic ulcer of other part of left foot with fat layer exposed: Secondary | ICD-10-CM | POA: Diagnosis not present

## 2018-03-20 ENCOUNTER — Ambulatory Visit (INDEPENDENT_AMBULATORY_CARE_PROVIDER_SITE_OTHER): Payer: Medicare Other

## 2018-03-20 DIAGNOSIS — Z5181 Encounter for therapeutic drug level monitoring: Secondary | ICD-10-CM | POA: Diagnosis not present

## 2018-03-20 DIAGNOSIS — I4891 Unspecified atrial fibrillation: Secondary | ICD-10-CM

## 2018-03-20 LAB — POCT INR: INR: 2.7 (ref 2.0–3.0)

## 2018-03-20 NOTE — Patient Instructions (Signed)
Please continue dosage of 1.5 pills every day except 1 pill on Mondays & Fridays. Please be consistent with your greens intake.   Recheck in 4 weeks.

## 2018-04-15 ENCOUNTER — Other Ambulatory Visit: Payer: Self-pay | Admitting: *Deleted

## 2018-04-15 MED ORDER — CARVEDILOL 6.25 MG PO TABS: 6.2500 mg | ORAL_TABLET | Freq: Two times a day (BID) | ORAL | 0 refills | Status: DC

## 2018-04-17 ENCOUNTER — Other Ambulatory Visit: Payer: Self-pay

## 2018-04-17 ENCOUNTER — Ambulatory Visit (INDEPENDENT_AMBULATORY_CARE_PROVIDER_SITE_OTHER): Payer: Medicare Other

## 2018-04-17 DIAGNOSIS — Z5181 Encounter for therapeutic drug level monitoring: Secondary | ICD-10-CM | POA: Diagnosis not present

## 2018-04-17 DIAGNOSIS — I4891 Unspecified atrial fibrillation: Secondary | ICD-10-CM

## 2018-04-17 LAB — POCT INR: INR: 2.7 (ref 2.0–3.0)

## 2018-04-17 MED ORDER — FUROSEMIDE 40 MG PO TABS: 40.0000 mg | ORAL_TABLET | Freq: Every day | ORAL | 3 refills | Status: DC

## 2018-04-17 NOTE — Telephone Encounter (Signed)
Requested Prescriptions   Signed Prescriptions Disp Refills  . furosemide (LASIX) 40 MG tablet 90 tablet 3    Sig: Take 1 tablet (40 mg total) by mouth daily.    Authorizing Provider: Kathlyn Sacramento A    Ordering User: Janan Ridge

## 2018-04-17 NOTE — Patient Instructions (Signed)
Please continue dosage of 1.5 pills every day except 1 pill on Mondays & Fridays. Please be consistent with your greens intake.   Recheck in 4 weeks.

## 2018-05-15 ENCOUNTER — Ambulatory Visit: Payer: Medicare Other

## 2018-05-15 DIAGNOSIS — Z5181 Encounter for therapeutic drug level monitoring: Secondary | ICD-10-CM | POA: Diagnosis not present

## 2018-05-15 DIAGNOSIS — I4891 Unspecified atrial fibrillation: Secondary | ICD-10-CM

## 2018-05-15 LAB — POCT INR: INR: 2.4 (ref 2.0–3.0)

## 2018-05-15 NOTE — Patient Instructions (Signed)
Please continue dosage of 1.5 pills every day except 1 pill on Mondays & Fridays. Please be consistent with your greens intake.   Recheck in 3.5 weeks.

## 2018-05-17 DIAGNOSIS — I10 Essential (primary) hypertension: Secondary | ICD-10-CM | POA: Diagnosis not present

## 2018-05-17 DIAGNOSIS — E1159 Type 2 diabetes mellitus with other circulatory complications: Secondary | ICD-10-CM | POA: Diagnosis not present

## 2018-05-17 DIAGNOSIS — E1169 Type 2 diabetes mellitus with other specified complication: Secondary | ICD-10-CM | POA: Diagnosis not present

## 2018-05-17 DIAGNOSIS — E785 Hyperlipidemia, unspecified: Secondary | ICD-10-CM | POA: Diagnosis not present

## 2018-05-17 DIAGNOSIS — E1142 Type 2 diabetes mellitus with diabetic polyneuropathy: Secondary | ICD-10-CM | POA: Diagnosis not present

## 2018-05-24 DIAGNOSIS — E1142 Type 2 diabetes mellitus with diabetic polyneuropathy: Secondary | ICD-10-CM | POA: Diagnosis not present

## 2018-05-30 ENCOUNTER — Other Ambulatory Visit: Payer: Self-pay | Admitting: Cardiovascular Disease

## 2018-05-30 NOTE — Telephone Encounter (Signed)
Please review for refill, Thanks !  

## 2018-06-06 ENCOUNTER — Encounter: Payer: Self-pay | Admitting: Cardiovascular Disease

## 2018-06-06 ENCOUNTER — Ambulatory Visit (INDEPENDENT_AMBULATORY_CARE_PROVIDER_SITE_OTHER): Payer: Medicare Other | Admitting: Cardiovascular Disease

## 2018-06-06 VITALS — BP 160/90 | HR 59 | Ht 67.0 in | Wt 260.5 lb

## 2018-06-06 DIAGNOSIS — I10 Essential (primary) hypertension: Secondary | ICD-10-CM | POA: Diagnosis not present

## 2018-06-06 DIAGNOSIS — I4819 Other persistent atrial fibrillation: Secondary | ICD-10-CM | POA: Diagnosis not present

## 2018-06-06 DIAGNOSIS — I25118 Atherosclerotic heart disease of native coronary artery with other forms of angina pectoris: Secondary | ICD-10-CM

## 2018-06-06 DIAGNOSIS — I5022 Chronic systolic (congestive) heart failure: Secondary | ICD-10-CM

## 2018-06-06 DIAGNOSIS — E785 Hyperlipidemia, unspecified: Secondary | ICD-10-CM

## 2018-06-06 MED ORDER — SACUBITRIL-VALSARTAN 97-103 MG PO TABS: 1.0000 | ORAL_TABLET | Freq: Two times a day (BID) | ORAL | 3 refills | Status: DC

## 2018-06-06 NOTE — Patient Instructions (Signed)
Medication Instructions:  INCREASE the Entresto to 97-103 mg twice daily If you need a refill on your cardiac medications before your next appointment, please call your pharmacy.   Lab work: Your provider would like for you to have the following labs on Monday: BMET, CBC, TSH and Free T4  Testing/Procedures: None ordered  Follow-Up: At [facility], you and your health needs are our priority.  As part of our continuing mission to provide you with exceptional heart care, we have created designated Provider Care Teams.  These Care Teams include your primary Cardiologist (physician) and Advanced Practice Providers (APPs -  Physician Assistants and Nurse Practitioners) who all work together to provide you with the care you need, when you need it. You will need a follow up appointment in 4 months. You may see Kathlyn Sacramento, MD or one of the following Advanced Practice Providers on your designated Care Team:   Murray Hodgkins, NP Christell Faith, PA-C . Marrianne Mood, PA-C  Any Other Special Instructions Will Be Listed Below (If Applicable). Please call [phone] the [facility] for an appointment with a Primary Care Physician.

## 2018-06-06 NOTE — Progress Notes (Signed)
Cardiology Office Note   Date:  06/06/2018   ID:  Meghan [name], DOB [birth year], MRN [id number]  PCP:  Patient, No Pcp Per  Cardiologist:   Kathlyn Sacramento, MD  Endocrinologist: Dr. Atha Starks  Chief Complaint  Patient presents with  . other    3 month follow up. Meds reviewed by the pt. verbally. "doing well."       History of Present Illness: Meghan [name] is a 68 y.o. female who presents for a followup visit regarding chronic systolic heart failure, coronary artery disease and persistent atrial fibrillation maintained in sinus rhythm with amiodarone. Other medical problems include hypertension, 2 diabetes,anemia, hyperlipidemia and carotid artery disease status post left carotid endarterectomy with previous CVA. She had worsening heart failure in 2018 in the setting of atrial fibrillation with rapid ventricular response.  She was treated with amiodarone with restoration of sinus rhythm.  She had a repeat echocardiogram in August, 2018 which showed severely reduced LV systolic function with an EF of 20-25%, mild mitral regurgitation, moderate to severe tricuspid regurgitation and moderate pulmonary hypertension with systolic pulmonary pressure around 50 mmHg. Right and left cardiac catheterization done in December 2018 showed significant one-vessel coronary artery disease with subtotal occlusion of the ostial and mid segment of the LAD with faint left to left collaterals.  There was mild to moderate left circumflex and RCA disease.  Right heart catheterization showed moderately elevated filling pressure with a wedge pressure of 23 mmHg, moderate pulmonary hypertension and mildly reduced cardiac output. She had significant hyperkalemia in the setting of renal failure and spironolactone was discontinued.  She is currently on Entresto and has been tolerating the medication with no issues.  She reports stable exertional dyspnea with no recent chest pain.  No significant leg edema.  In spite  of that, her weight continues to increase.  She is concerned that abnormal thyroid function might be contributing.  She has known history of uncontrolled diabetes mellitus with hemoglobin A1c greater than 14 last year.  This has gradually improved to 9.2.  Past Medical History:  Diagnosis Date  . Carotid arterial disease ([place])    a. 02/2012 s/p L CEA.  . Coronary artery disease    a. 02/2012 Lexi MV: Anterior, anteroseptal, septal, apical infarct with mild peri-infarct ischemia, EF 41%- medically managed due to anemia; b. 03/2017 Cath: LM nl, LAD 99ost/p/m - faint L->L collaterals, RI 20ost, LCX 37m, RCA 20p, 34m-->Med Rx for chronic subtotal LAD dzs. Avoid R Radial access in future.  . 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 Hyperlipidemia   . Hypertension   . Ischemic cardiomyopathy    a. 06/2013 Echo: EF 35-40%, glob HK, mildly dil LA, mild LVH, mild TR, mild to mod MR; b. 07/2016 Echo: EF 25-30%, sev mid-apicalanteroseptal, ant, apical HK, mild MR, sev dil LA, mod dil RA, mod TR, PASP 35mmHg; c. 11/2016 Echo: EF 20-25%, antsept DK, Gr2 DD, mild MR, mod dil LA/RV/RA, mod red RV fxn, mod-sev TR, PASP 40-24mmHg.  . Morbid obesity ([place])   . Normocytic anemia   . Persistent atrial fibrillation    a. CHA2DS2VASc = 7-->coumadin; maintaining sinus on amio.  . Stroke [facility]) 2013    Past Surgical History:  Procedure Laterality Date  . AMPUTATION TOE Left 09/22/2016   Procedure: AMPUTATION TOE;  Surgeon: Albertine Patricia, DPM;  Location: [facility] ORS;  Service: Podiatry;  Laterality: Left;  . CARDIOVERSION N/A 07/28/2016   Procedure: CARDIOVERSION;  Surgeon: Rogue Jury  Ferne Reus, MD;  Location: [facility] ORS;  Service: Cardiovascular;  Laterality: N/A;  . CARDIOVERSION N/A 08/07/2016   Procedure: CARDIOVERSION;  Surgeon: Wellington Hampshire, MD;  Location: [facility] ORS;  Service: Cardiovascular;  Laterality: N/A;  . CAROTID ENDARTERECTOMY     [facility]; Dr. Lucky Cowboy  . CHOLECYSTECTOMY    . LEG SURGERY      right;infection  . RIGHT/LEFT HEART CATH AND CORONARY ANGIOGRAPHY Bilateral 03/12/2017   Procedure: RIGHT/LEFT HEART CATH AND CORONARY ANGIOGRAPHY;  Surgeon: Wellington Hampshire, MD;  Location: [place] CV LAB;  Service: Cardiovascular;  Laterality: Bilateral;  . TONSILLECTOMY       Current Outpatient Medications  Medication Sig Dispense Refill  . amiodarone (PACERONE) 200 MG tablet Take 0.5 tablets (100 mg total) by mouth daily. 45 tablet 3  . atorvastatin (LIPITOR) 40 MG tablet Take 1 tablet (40 mg total) by mouth daily. 90 tablet 1  . carvedilol (COREG) 6.25 MG tablet Take 1 tablet (6.25 mg total) by mouth 2 (two) times daily with a meal. 60 tablet 0  . Ferrous Sulfate (IRON) 325 (65 Fe) MG TABS Take 1 tablet (325 mg total) by mouth daily. 30 each 0  . furosemide (LASIX) 40 MG tablet Take 1 tablet (40 mg total) by mouth daily. 90 tablet 3  . glipiZIDE (GLUCOTROL) 10 MG tablet Take 10 mg by mouth 2 (two) times daily.     . Insulin Degludec 200 UNIT/ML SOPN Start 26 units once a day.  Titrate to a max dose of 60 units daily    . sacubitril-valsartan (ENTRESTO) 49-51 MG Take 1 tablet by mouth 2 (two) times daily. 180 tablet 1  . warfarin (COUMADIN) 5 MG tablet TAKE 1 TABLET BY MOUTH ONCE DAILY OR AS DIRECTED BY COUMADIN CLINIC 30 tablet 0   No current facility-administered medications for this visit.     Allergies:   Patient has no known allergies.    Social History:  The patient  reports that she has quit smoking. Her smoking use included cigarettes. She has a 2.50 pack-year smoking history. She has never used smokeless tobacco. She reports that she does not drink alcohol or use drugs.   Family History:  The patient's family history includes Heart disease in her mother.    ROS:  Please see the history of present illness.   Otherwise, review of systems are positive for none.   All other systems are reviewed and negative.    PHYSICAL EXAM: VS:  BP (!) 160/90 (BP Location: Left Arm,  Patient Position: Sitting, Cuff Size: Large)   Pulse (!) 59   Ht 5\' 7"  (1.702 m)   Wt 260 lb 8 oz (118.2 kg)   BMI 40.80 kg/m  , BMI Body mass index is 40.8 kg/m. GEN: Well nourished, well developed, in no acute distress  HEENT: normal  Neck: No JVD, carotid bruits, or masses Cardiac:RRR; no murmurs, rubs, or gallops, trace bilateral leg edema  Respiratory: Few bibasilar crackles, normal work of breathing GI: soft, nontender, mild abdominal distention, + BS MS: no deformity or atrophy  Skin: warm and dry, no rash Neuro:  Strength and sensation are intact Psych: euthymic mood, full affect   EKG:  EKG is ordered today. The ekg ordered today demonstrates sinus bradycardia with PACs.  Minimal LVH.  Lateral T wave changes suggestive of ischemia.   Recent Labs: 06/15/2017: ALT 27 11/28/2017: BUN 35; Creatinine, Ser 1.30; Potassium 4.7; Sodium 140    Lipid Panel    Component Value  Date/Time   CHOL 67 (L) 12/27/2016 0826   CHOL 165 02/14/2012 0410   TRIG 45 12/27/2016 0826   TRIG 168 02/14/2012 0410   HDL 23 (L) 12/27/2016 0826   HDL 31 (L) 02/14/2012 0410   CHOLHDL 2.9 12/27/2016 0826   VLDL 34 02/14/2012 0410   LDLCALC 35 12/27/2016 0826   LDLCALC 100 02/14/2012 0410      Wt Readings from Last 3 Encounters:  06/06/18 260 lb 8 oz (118.2 kg)  02/28/18 255 lb (115.7 kg)  11/23/17 247 lb (112 kg)       ASSESSMENT AND PLAN:  1. Chronic systolic heart failure: She is currently New York Heart Association class II.  Her weight increased another 5 pounds since last visit but I still do not see evidence of significant volume overload.  I am going to keep on the same dose of furosemide.  I elected to increase the dose of Entresto.  Not able to increase carvedilol due to relatively slow heart rate.  I am not planning to resume spironolactone given underlying chronic kidney disease with previous hyperkalemia on spironolactone.   Check basic metabolic profile next week.  2.  Persistent atrial fibrillation: She is maintaining in sinus rhythm with low-dose amiodarone.  She is on long-term anticoagulation with warfarin.  Check CBC next week.  3. Essential hypertension: Blood pressure is well controlled.  Entresto was increased as outlined above.  4. Coronary artery disease involving native coronary arteries with other forms of angina: Subtotal occlusion of the LAD with collaterals.  Continue medical therapy.  5. Hyperlipidemia: Atorvastatin was increased during last visit to 40 mg daily.  6.  Possible hypothyroidism: Likely due to treatment with amiodarone.  I am going to recheck her thyroid function next week and consider adding small dose replacement therapy.   Patient has multiple chronic medical conditions and currently does not have a primary care physician.  I strongly advised her to establish with a PCP.    Disposition:   FU with me in 4 months  Signed,  Kathlyn Sacramento, MD  06/06/2018 11:15 AM    [place]

## 2018-06-10 ENCOUNTER — Ambulatory Visit: Payer: Medicare Other

## 2018-06-10 ENCOUNTER — Other Ambulatory Visit (INDEPENDENT_AMBULATORY_CARE_PROVIDER_SITE_OTHER): Payer: Medicare Other

## 2018-06-10 DIAGNOSIS — I4819 Other persistent atrial fibrillation: Secondary | ICD-10-CM

## 2018-06-10 DIAGNOSIS — E785 Hyperlipidemia, unspecified: Secondary | ICD-10-CM

## 2018-06-10 DIAGNOSIS — Z5181 Encounter for therapeutic drug level monitoring: Secondary | ICD-10-CM

## 2018-06-10 DIAGNOSIS — I10 Essential (primary) hypertension: Secondary | ICD-10-CM

## 2018-06-10 DIAGNOSIS — I25118 Atherosclerotic heart disease of native coronary artery with other forms of angina pectoris: Secondary | ICD-10-CM

## 2018-06-10 DIAGNOSIS — I4891 Unspecified atrial fibrillation: Secondary | ICD-10-CM

## 2018-06-10 LAB — POCT INR: INR: 3.2 — AB (ref 2.0–3.0)

## 2018-06-10 NOTE — Patient Instructions (Signed)
Please continue dosage of 1.5 pills every day except 1 pill on Mondays & Fridays. Please be consistent with your greens intake.   Recheck in 3 weeks.

## 2018-06-11 LAB — BASIC METABOLIC PANEL
BUN/Creatinine Ratio: 17 (ref 12–28)
BUN: 19 mg/dL (ref 8–27)
CO2: 25 mmol/L (ref 20–29)
Calcium: 8.7 mg/dL (ref 8.7–10.3)
Chloride: 102 mmol/L (ref 96–106)
Creatinine, Ser: 1.15 mg/dL — ABNORMAL HIGH (ref 0.57–1.00)
GFR calc Af Amer: 56 mL/min/{1.73_m2} — ABNORMAL LOW (ref >59)
GFR calc non Af Amer: 49 mL/min/{1.73_m2} — ABNORMAL LOW (ref >59)
Glucose: 104 mg/dL — ABNORMAL HIGH (ref 65–99)
POTASSIUM: 4 mmol/L (ref 3.5–5.2)
Sodium: 143 mmol/L (ref 134–144)

## 2018-06-11 LAB — CBC
Hematocrit: 41.5 % (ref 34.0–46.6)
Hemoglobin: 14.1 g/dL (ref 11.1–15.9)
MCH: 30.7 pg (ref 26.6–33.0)
MCHC: 34 g/dL (ref 31.5–35.7)
MCV: 90 fL (ref 79–97)
Platelets: 198 10*3/uL (ref 150–450)
RBC: 4.59 x10E6/uL (ref 3.77–5.28)
RDW: 13.6 % (ref 11.7–15.4)
WBC: 6.4 10*3/uL (ref 3.4–10.8)

## 2018-06-11 LAB — TSH: TSH: 7.43 u[IU]/mL — AB (ref 0.450–4.500)

## 2018-06-11 LAB — T4, FREE: Free T4: 1.1 ng/dL (ref 0.82–1.77)

## 2018-06-14 ENCOUNTER — Telehealth: Payer: Self-pay | Admitting: *Deleted

## 2018-06-14 DIAGNOSIS — E039 Hypothyroidism, unspecified: Secondary | ICD-10-CM

## 2018-06-14 MED ORDER — LEVOTHYROXINE SODIUM 25 MCG PO TABS: 25.0000 ug | ORAL_TABLET | Freq: Every day | ORAL | 11 refills | Status: DC

## 2018-06-14 NOTE — Telephone Encounter (Signed)
-----   Message from Wellington Hampshire, MD sent at 06/14/2018 11:10 AM EST ----- Inform patient that labs were stable but she continues to have elevated TSH suggestive of mild hypothyroidism.  I recommend adding levothyroxine 25 mcg once daily.  Repeat TSH in 2 months.

## 2018-06-14 NOTE — Telephone Encounter (Signed)
Patient made aware of results and verbalized understanding.  Levothyroxine 25 mcg daily has been sent into the pharmacy for her. TSH labs have been ordered

## 2018-06-23 ENCOUNTER — Other Ambulatory Visit: Payer: Self-pay | Admitting: Cardiovascular Disease

## 2018-06-24 NOTE — Telephone Encounter (Signed)
Refill Request.  

## 2018-06-27 ENCOUNTER — Ambulatory Visit (INDEPENDENT_AMBULATORY_CARE_PROVIDER_SITE_OTHER): Payer: Medicare Other | Admitting: Family Medicine

## 2018-06-27 ENCOUNTER — Other Ambulatory Visit: Payer: Self-pay

## 2018-06-27 ENCOUNTER — Encounter: Payer: Self-pay | Admitting: Family Medicine

## 2018-06-27 VITALS — BP 122/78 | HR 64 | Ht 67.0 in | Wt 269.0 lb

## 2018-06-27 DIAGNOSIS — S98112A Complete traumatic amputation of left great toe, initial encounter: Secondary | ICD-10-CM

## 2018-06-27 DIAGNOSIS — Z23 Encounter for immunization: Secondary | ICD-10-CM | POA: Diagnosis not present

## 2018-06-27 DIAGNOSIS — I25118 Atherosclerotic heart disease of native coronary artery with other forms of angina pectoris: Secondary | ICD-10-CM

## 2018-06-27 DIAGNOSIS — Z7689 Persons encountering health services in other specified circumstances: Secondary | ICD-10-CM | POA: Diagnosis not present

## 2018-06-27 DIAGNOSIS — I10 Essential (primary) hypertension: Secondary | ICD-10-CM | POA: Diagnosis not present

## 2018-06-27 NOTE — Progress Notes (Signed)
**Note Meghan-Identified via Obfuscation** Date:  06/27/2018   Name:  Meghan [name]   DOB:  [birth year]   MRN:  [id number]   Chief Complaint: [place] (specialist say she needs a PCP)  Patient is a 68  year old female2 who presents for a comprehensive physical exam. The patient reports the following problems: none. Health maintenance has been reviewed influenza.   Review of Systems  Constitutional: Negative.  Negative for chills, fatigue, fever and unexpected weight change.  HENT: Negative for congestion, ear discharge, ear pain, rhinorrhea, sinus pressure, sneezing and sore throat.   Eyes: Negative for photophobia, pain, discharge, redness and itching.  Respiratory: Negative for apnea, cough, choking, chest tightness, shortness of breath, wheezing and stridor.   Cardiovascular: Negative for chest pain, palpitations and leg swelling.  Gastrointestinal: Negative for abdominal pain, anal bleeding, blood in stool, constipation, diarrhea, nausea and vomiting.  Endocrine: Negative for cold intolerance, heat intolerance, polydipsia, polyphagia and polyuria.  Genitourinary: Negative for dysuria, flank pain, frequency, hematuria, menstrual problem, pelvic pain, urgency, vaginal bleeding and vaginal discharge.  Musculoskeletal: Negative for arthralgias, back pain and myalgias.  Skin: Negative for color change, pallor and rash.  Allergic/Immunologic: Negative for environmental allergies and food allergies.  Neurological: Negative for dizziness, weakness, light-headedness, numbness and headaches.  Hematological: Negative for adenopathy. Does not bruise/bleed easily.  Psychiatric/Behavioral: Negative for dysphoric mood. The patient is not nervous/anxious.     Patient Active Problem List   Diagnosis Date Noted   Sepsis ([place]) 03/30/2017   Elevated TSH 12/29/2016   Psoriasis 12/26/2016   Obesity, Class III, BMI 40-49.9 (morbid obesity) ([place]) 12/26/2016   Iron deficiency anemia 12/25/2016   Toe osteomyelitis, left ([place])  09/21/2016   CKD (chronic kidney disease), stage III ([place]) 07/26/2016   History of CVA (cerebrovascular accident) without residual deficits 09/03/2013   Hyperlipidemia 09/03/2013   Type 2 diabetes mellitus with peripheral neuropathy ([place]) 09/03/2013   Paroxysmal atrial fibrillation ([place]) 07/16/2013   Ischemic cardiomyopathy 07/16/2013   Hypertension 52/84/1324   Chronic systolic heart failure ([place]) 04/12/2012   Coronary atherosclerosis    Dyspnea 03/01/2012    No Known Allergies  Past Surgical History:  Procedure Laterality Date   AMPUTATION TOE Left 09/22/2016   Procedure: AMPUTATION TOE;  Surgeon: Albertine Patricia, DPM;  Location: [facility]S;  Service: Podiatry;  Laterality: Left;   CARDIOVERSION N/A 07/28/2016   Procedure: CARDIOVERSION;  Surgeon: Wellington Hampshire, MD;  Location: [facility];  Service: Cardiovascular;  Laterality: N/A;   CARDIOVERSION N/A 08/07/2016   Procedure: CARDIOVERSION;  Surgeon: Wellington Hampshire, MD;  Location: [facility];  Service: Cardiovascular;  Laterality: N/A;   CAROTID ENDARTERECTOMY     [facility]; Dr. Lucky Cowboy   CHOLECYSTECTOMY     foot infection Right    LEG SURGERY     right;infection   RIGHT/LEFT HEART CATH AND CORONARY ANGIOGRAPHY Bilateral 03/12/2017   Procedure: RIGHT/LEFT HEART CATH AND CORONARY ANGIOGRAPHY;  Surgeon: Wellington Hampshire, MD;  Location: [place] [facility];  Service: Cardiovascular;  Laterality: Bilateral;   TONSILLECTOMY      Social History   Tobacco Use   Smoking status: Former Smoker    Packs/day: 0.50    Years: 5.00    Pack years: 2.50    Types: Cigarettes   Smokeless tobacco: Never Used  Substance Use Topics   Alcohol use: No   Drug use: No     Medication list has been reviewed and updated.  Current Meds  Medication Sig   amiodarone (PACERONE) 200 MG  tablet Take 0.5 tablets (100 mg total) by mouth daily. (Patient taking differently: Take 100 mg by mouth daily. Dr Fletcher Anon)   atorvastatin (LIPITOR)  40 MG tablet Take 1 tablet (40 mg total) by mouth daily. (Patient taking differently: Take 40 mg by mouth daily. Dr Fletcher Anon)   carvedilol (COREG) 6.25 MG tablet TAKE 1 TABLET BY MOUTH TWICE DAILY WITH A MEAL (Patient taking differently: Take 6.25 mg by mouth 2 (two) times daily with a meal. Dr Fletcher Anon)   furosemide (LASIX) 40 MG tablet Take 1 tablet (40 mg total) by mouth daily. (Patient taking differently: Take 40 mg by mouth daily. Dr Fletcher Anon)   glipiZIDE (GLUCOTROL) 10 MG tablet Take 10 mg by mouth 2 (two) times daily. Dr Honor Junes   Insulin Degludec 200 UNIT/ML SOPN Dr Honor Junes   levothyroxine (SYNTHROID, LEVOTHROID) 25 MCG tablet Take 1 tablet (25 mcg total) by mouth daily before breakfast. (Patient taking differently: Take 25 mcg by mouth daily before breakfast. Dr Fletcher Anon)   sacubitril-valsartan (ENTRESTO) 97-103 MG Take 1 tablet by mouth 2 (two) times daily. (Patient taking differently: Take 1 tablet by mouth 2 (two) times daily. Dr Fletcher Anon)   warfarin (COUMADIN) 5 MG tablet TAKE 1 TABLET BY MOUTH ONCE DAILY OR  AS  DIRECTED  BY  COUMADIN  CLINIC (Patient taking differently: TAKE 1 TABLET BY MOUTH ONCE DAILY OR AS DIRECTED BY COUMADIN CLINIC/ 5mg  2 days a week and 7.5mg  the other days/ Dr Fletcher Anon)    [facility] 2/9 Scores 06/27/2018 12/25/2016 02/23/2015  PHQ - 2 Score 0 0 0  PHQ- 9 Score 0 - -    Physical Exam Vitals signs and nursing note reviewed.  Constitutional:      General: She is not in acute distress.    Appearance: She is not diaphoretic.  HENT:     Head: Normocephalic and atraumatic.     Right Ear: External ear normal.     Left Ear: External ear normal.     Nose: Nose normal.  Eyes:     General:        Right eye: No discharge.        Left eye: No discharge.     Conjunctiva/sclera: Conjunctivae normal.     Pupils: Pupils are equal, round, and reactive to light.  Neck:     Musculoskeletal: Normal range of motion and neck supple.     Thyroid: No thyromegaly.     Vascular: No JVD.   Cardiovascular:     Rate and Rhythm: Normal rate and regular rhythm.     Heart sounds: Normal heart sounds. No murmur. No friction rub. No gallop.   Pulmonary:     Effort: Pulmonary effort is normal.     Breath sounds: Normal breath sounds.  Abdominal:     General: Bowel sounds are normal.     Palpations: Abdomen is soft. There is no mass.     Tenderness: There is no abdominal tenderness. There is no guarding.  Musculoskeletal: Normal range of motion.  Lymphadenopathy:     Cervical: No cervical adenopathy.  Skin:    General: Skin is warm and dry.  Neurological:     Mental Status: She is alert.     Deep Tendon Reflexes: Reflexes are normal and symmetric.     Wt Readings from Last 3 Encounters:  06/27/18 269 lb (122 kg)  06/06/18 260 lb 8 oz (118.2 kg)  02/28/18 255 lb (115.7 kg)    BP 122/78    Pulse 64  Ht 5\' 7"  (1.702 m)    Wt 269 lb (122 kg)    BMI 42.13 kg/m   Assessment and Plan: 1. Establishing care with new doctor, encounter for Patient establishing care with new physician.  Patient's previous visits, labs, imaging, and specialty visits particularly diabetes and cardiology were reviewed.  2. Essential hypertension This is controlled by cardiology patient is currently not elevated blood pressure is stable.  3. Obesity, Class III, BMI 40-49.9 (morbid obesity) ([place]) Health risks of being over weight were discussed and patient was counseled on weight loss options and exercise.  Weight loss was discussed with patient and weight loss diet was provided.  4. Amputation of left great toe [facility]) Patient was noted to have had an amputation of her left great toe due to osteomyelitis which she is doing well except for some gait concerns.  5. Influenza vaccine needed [place] and administered. - Flu vaccine HIGH DOSE PF (Fluzone High dose)

## 2018-06-30 ENCOUNTER — Telehealth: Payer: Self-pay | Admitting: Pharmacist

## 2018-06-30 NOTE — Telephone Encounter (Signed)

## 2018-07-01 ENCOUNTER — Ambulatory Visit (INDEPENDENT_AMBULATORY_CARE_PROVIDER_SITE_OTHER): Payer: Medicare Other

## 2018-07-01 ENCOUNTER — Other Ambulatory Visit: Payer: Self-pay

## 2018-07-01 DIAGNOSIS — Z5181 Encounter for therapeutic drug level monitoring: Secondary | ICD-10-CM

## 2018-07-01 DIAGNOSIS — I4891 Unspecified atrial fibrillation: Secondary | ICD-10-CM | POA: Diagnosis not present

## 2018-07-01 LAB — POCT INR: INR: 2.1 (ref 2.0–3.0)

## 2018-07-23 ENCOUNTER — Other Ambulatory Visit: Payer: Self-pay | Admitting: Cardiovascular Disease

## 2018-07-23 NOTE — Telephone Encounter (Signed)
Refill request

## 2018-08-02 ENCOUNTER — Telehealth: Payer: Self-pay

## 2018-08-02 NOTE — Telephone Encounter (Signed)

## 2018-08-05 ENCOUNTER — Other Ambulatory Visit: Payer: Self-pay

## 2018-08-05 ENCOUNTER — Ambulatory Visit (INDEPENDENT_AMBULATORY_CARE_PROVIDER_SITE_OTHER): Payer: Medicare Other

## 2018-08-05 DIAGNOSIS — I4891 Unspecified atrial fibrillation: Secondary | ICD-10-CM | POA: Diagnosis not present

## 2018-08-05 DIAGNOSIS — Z5181 Encounter for therapeutic drug level monitoring: Secondary | ICD-10-CM | POA: Diagnosis not present

## 2018-08-05 LAB — POCT INR: INR: 2 (ref 2.0–3.0)

## 2018-08-05 NOTE — Patient Instructions (Signed)
Please continue dosage of 1.5 pills every day except 1 pill on Mondays & Fridays. Recheck in 7 weeks.

## 2018-08-14 ENCOUNTER — Other Ambulatory Visit: Payer: Self-pay | Admitting: Cardiovascular Disease

## 2018-08-28 ENCOUNTER — Telehealth: Payer: Self-pay | Admitting: Cardiovascular Disease

## 2018-08-28 NOTE — Telephone Encounter (Signed)
°*  STAT* If patient is at the pharmacy, call can be transferred to refill team.   1. Which medications need to be refilled? (please list name of each medication and dose if known)  amiodarone 200 MG 1/2 tablet daily  Atorvastatin 40 MG - 1 tablet daily  Glipizide 10 MG - 2 times daily   2. Which pharmacy/location (including street and city if local pharmacy) is medication to be sent to? [facility] in [place]   3. Do they need a 30 day or 90 day supply? 90 day

## 2018-08-28 NOTE — Telephone Encounter (Signed)
Please review for refill.  

## 2018-08-29 NOTE — Telephone Encounter (Signed)
Diabetic medications should be refilled by primary care physician.

## 2018-08-30 NOTE — Telephone Encounter (Signed)
The patient was sent in a 3 month supply of Amiodarone and Atorvastatin on 08/14/2018. She has a follow up appointment on 09/24/2018.  Glipizide is a diabetic medication so this needs to be refilled by PCP.

## 2018-09-18 ENCOUNTER — Telehealth: Payer: Self-pay

## 2018-09-18 NOTE — Telephone Encounter (Signed)

## 2018-09-23 ENCOUNTER — Other Ambulatory Visit: Payer: Self-pay | Admitting: Cardiovascular Disease

## 2018-09-23 ENCOUNTER — Telehealth: Payer: Self-pay

## 2018-09-23 NOTE — Telephone Encounter (Signed)
Refill Request.  

## 2018-09-23 NOTE — Telephone Encounter (Signed)

## 2018-09-24 ENCOUNTER — Other Ambulatory Visit: Payer: Self-pay

## 2018-09-24 ENCOUNTER — Telehealth (INDEPENDENT_AMBULATORY_CARE_PROVIDER_SITE_OTHER): Payer: Medicare Other | Admitting: Cardiovascular Disease

## 2018-09-24 ENCOUNTER — Encounter: Payer: Self-pay | Admitting: Cardiovascular Disease

## 2018-09-24 VITALS — Ht 67.0 in | Wt 269.0 lb

## 2018-09-24 DIAGNOSIS — I5022 Chronic systolic (congestive) heart failure: Secondary | ICD-10-CM

## 2018-09-24 DIAGNOSIS — I2510 Atherosclerotic heart disease of native coronary artery without angina pectoris: Secondary | ICD-10-CM

## 2018-09-24 NOTE — Progress Notes (Signed)
Virtual Visit via Telephone Note   This visit type was conducted due to national recommendations for restrictions regarding the COVID-19 Pandemic (e.g. social distancing) in an effort to limit this patient's exposure and mitigate transmission in our community.  Due to her co-morbid illnesses, this patient is at least at moderate risk for complications without adequate follow up.  This format is felt to be most appropriate for this patient at this time.  The patient did not have access to video technology/had technical difficulties with video requiring transitioning to audio format only (telephone).  All issues noted in this document were discussed and addressed.  No physical exam could be performed with this format.  Please refer to the patient's chart for her  consent to telehealth for [facility].   Date:  09/24/2018   ID:  Meghan [name], DOB 1950/09/21, MRN [id number]  Patient Location: Home Provider Location: Office  PCP:  [name], MD  Cardiologist:  Kathlyn Sacramento, MD  Electrophysiologist:  None   Evaluation Performed:  Follow-Up Visit  Chief Complaint: Doing well with no complaints today.  History of Present Illness:    Meghan [name] is a 68 y.o. female was reached via phone for follow-up visit regarding chronic systolic heart failure, coronary artery disease and persistent atrial fibrillation maintained in sinus rhythm with amiodarone. Other medical problems include hypertension, diabetes, anemia, hyperlipidemia and carotid artery disease status post left carotid endarterectomy with previous CVA. She had worsening heart failure in 2018 in the setting of atrial fibrillation with rapid ventricular response.  She was treated with amiodarone with restoration of sinus rhythm. Most recent echocardiogram in August, 2018 showed severely reduced LV systolic function with an EF of 20-25%, mild mitral regurgitation, moderate to severe tricuspid regurgitation and moderate pulmonary  hypertension with systolic pulmonary pressure around 50 mmHg. Right and left cardiac catheterization done in December 2018 showed significant one-vessel coronary artery disease with subtotal occlusion of the ostial and mid segment of the LAD with faint left to left collaterals.  There was mild to moderate left circumflex and RCA disease.  Right heart catheterization showed moderately elevated filling pressure with a wedge pressure of 23 mmHg, moderate pulmonary hypertension and mildly reduced cardiac output. She had significant hyperkalemia in the setting of renal failure and spironolactone was discontinued.  She is currently on Entresto and has been tolerating the medication with no issues.  She has been doing reasonably well overall with stable exertional dyspnea with no orthopnea or significant edema.  No chest pain.  She was started on small dose of levothyroxine few months ago given persistently low TSH with weight gain.  The patient does not have symptoms concerning for COVID-19 infection (fever, chills, cough, or new shortness of breath).    Past Medical History:  Diagnosis Date   Carotid arterial disease ([place])    a. 02/2012 s/p L CEA.   Coronary artery disease    a. 02/2012 [facility] MV: Anterior, anteroseptal, septal, apical infarct with mild peri-infarct ischemia, EF 41%- medically managed due to anemia; b. 03/2017 Cath: LM nl, LAD 99ost/p/m - faint L->L collaterals, RI 20ost, LCX 5m, RCA 20p, 31m-->Med Rx for chronic subtotal LAD dzs. Avoid R Radial access in future.   Diabetes mellitus without complication ([address])    Type II   Hyperlipidemia    Hypertension    Ischemic cardiomyopathy    a. 06/2013 Echo: EF 35-40%, glob HK, mildly dil LA, mild LVH, mild TR, mild to mod MR; b. 07/2016 Echo: EF  25-30%, sev mid-apicalanteroseptal, ant, apical HK, mild MR, sev dil LA, mod dil RA, mod TR, PASP 84mmHg; c. 11/2016 Echo: EF 20-25%, antsept DK, Gr2 DD, mild MR, mod dil LA/RV/RA, mod red RV fxn,  mod-sev TR, PASP 40-63mmHg.   Morbid obesity (H[facility])    Normocytic anemia    Persistent atrial fibrillation    a. CHA2DS2VASc = 7-->coumadin; maintaining sinus on amio.   Stroke [facility]) 2013   Past Surgical History:  Procedure Laterality Date   AMPUTATION TOE Left 09/22/2016   Procedure: AMPUTATION TOE;  Surgeon: Albertine Patricia, DPM;  Location: [facility] OR[facility];  Service: Podiatry;  Laterality: Left;   CARDIOVERSION N/A 07/28/2016   Procedure: CARDIOVERSION;  Surgeon: Wellington Hampshire, MD;  Location: [facility] OR[facility];  Service: Cardiovascular;  Laterality: N/A;   CARDIOVERSION N/A 08/07/2016   Procedure: CARDIOVERSION;  Surgeon: Wellington Hampshire, MD;  Location: [facility] OR[facility];  Service: Cardiovascular;  Laterality: N/A;   CAROTID ENDARTERECTOMY     [facility]; Dr. Lucky Cowboy   CHOLECYSTECTOMY     foot infection Right    LEG SURGERY     right;infection   RIGHT/LEFT HEART CATH AND CORONARY ANGIOGRAPHY Bilateral 03/12/2017   Procedure: RIGHT/LEFT HEART CATH AND CORONARY ANGIOGRAPHY;  Surgeon: Wellington Hampshire, MD;  Location: [facility];  Service: Cardiovascular;  Laterality: Bilateral;   TONSILLECTOMY       Current Meds  Medication Sig   amiodarone (PACERONE) 200 MG tablet Take 1/2 (one-half) tablet by mouth once daily   atorvastatin (LIPITOR) 40 MG tablet Take 1 tablet by mouth once daily   carvedilol (COREG) 6.25 MG tablet TAKE 1 TABLET BY MOUTH TWICE DAILY WITH A MEAL   furosemide (LASIX) 40 MG tablet Take 1 tablet (40 mg total) by mouth daily. (Patient taking differently: Take 40 mg by mouth daily. Dr Fletcher Anon)   glipiZIDE (GLUCOTROL) 10 MG tablet Take 10 mg by mouth 2 (two) times daily. Dr Honor Junes   Insulin Degludec 200 UNIT/ML SOPN Dr Honor Junes   levothyroxine (SYNTHROID, LEVOTHROID) 25 MCG tablet Take 1 tablet (25 mcg total) by mouth daily before breakfast. (Patient taking differently: Take 25 mcg by mouth daily before breakfast. Dr Fletcher Anon)   sacubitril-valsartan (ENTRESTO) 97-103 MG Take  1 tablet by mouth 2 (two) times daily. (Patient taking differently: Take 1 tablet by mouth 2 (two) times daily. Dr Fletcher Anon)   warfarin (COUMADIN) 5 MG tablet USE AS DIRECTED BY COUMADIN CLINIC FOR 30 DAYS     Allergies:   Patient has no known allergies.   Social History   Tobacco Use   Smoking status: Former Smoker    Packs/day: 0.50    Years: 5.00    Pack years: 2.50    Types: Cigarettes   Smokeless tobacco: Never Used  Substance Use Topics   Alcohol use: No   Drug use: No     Family Hx: The patient's family history includes Diabetes in her maternal grandmother and mother; Heart disease in her father and mother; Hypertension in her father.  ROS:   Please see the history of present illness.     All other systems reviewed and are negative.   Prior CV studies:   The following studies were reviewed today:    Labs/Other Tests and Data Reviewed:    EKG:  No ECG reviewed.  Recent Labs: 06/10/2018: BUN 19; Creatinine, Ser 1.15; Hemoglobin 14.1; Platelets 198; Potassium 4.0; Sodium 143; TSH 7.430   Recent Lipid Panel Lab Results  Component Value Date/Time   CHOL 67 (L)  12/27/2016 08:26 AM   CHOL 165 02/14/2012 04:10 AM   TRIG 45 12/27/2016 08:26 AM   TRIG 168 02/14/2012 04:10 AM   HDL 23 (L) 12/27/2016 08:26 AM   HDL 31 (L) 02/14/2012 04:10 AM   CHOLHDL 2.9 12/27/2016 08:26 AM   LDLCALC 35 12/27/2016 08:26 AM   LDLCALC 100 02/14/2012 04:10 AM    Wt Readings from Last 3 Encounters:  09/24/18 269 lb (122 kg)  06/27/18 269 lb (122 kg)  06/06/18 260 lb 8 oz (118.2 kg)     Objective:    Vital Signs:  Ht 5\' 7"  (1.702 m)    Wt 269 lb (122 kg)    BMI 42.13 kg/m    VITAL SIGNS:  reviewed  ASSESSMENT & PLAN:    1. Chronic systolic heart failure: She is currently New York Heart Association class II.   No further weight gain since she was placed on small dose levothyroxine.   Continue treatment with carvedilol and Entresto.  She appears to be euvolemic on current  dose of furosemide.  2. Persistent atrial fibrillation: She is maintaining in sinus rhythm with low-dose amiodarone.  She is on long-term anticoagulation with warfarin.    We will need repeat thyroid and liver testing given that initiation of levothyroxine also because she is on amiodarone.  3. Essential hypertension: She could not check her blood pressure today  4. Coronary artery disease involving native coronary arteries with other forms of angina: Subtotal occlusion of the LAD with collaterals.  Continue medical therapy.  5. Hyperlipidemia: Atorvastatin was increased during last visit to 40 mg daily.  6.    Hypothyroidism: Likely due to treatment with amiodarone.  She was started on small dose levothyroxine.  The plan is to repeat TSH with her next visit in 3 months unless it is done before then with her primary care physician.   COVID-19 Education: The signs and symptoms of COVID-19 were discussed with the patient and how to seek care for testing (follow up with PCP or arrange E-visit).  The importance of social distancing was discussed today.  Time:   Today, I have spent 8 minutes with the patient with telehealth technology discussing the above problems.     Medication Adjustments/Labs and Tests Ordered: Current medicines are reviewed at length with the patient today.  Concerns regarding medicines are outlined above.   Tests Ordered: No orders of the defined types were placed in this encounter.   Medication Changes: No orders of the defined types were placed in this encounter.   Follow Up:  In Person in 3 month(s)  Signed, Kathlyn Sacramento, MD  09/24/2018 1:45 PM    [place]

## 2018-09-24 NOTE — Patient Instructions (Addendum)
Medication Instructions:  Continue same medications If you need a refill on your cardiac medications before your next appointment, please call your pharmacy.   Lab work: None If you have labs (blood work) drawn today and your tests are completely normal, you will receive your results only by: Marland Kitchen MyChart Message (if you have MyChart) OR . A paper copy in the mail If you have any lab test that is abnormal or we need to change your treatment, we will call you to review the results.  Testing/Procedures: None  Follow-Up: At [facility], you and your health needs are our priority.  As part of our continuing mission to provide you with exceptional heart care, we have created designated Provider Care Teams.  These Care Teams include your primary Cardiologist (physician) and Advanced Practice Providers (APPs -  Physician Assistants and Nurse Practitioners) who all work together to provide you with the care you need, when you need it.  You will need a follow up appointment in 3 months (September)  Please call our office 2 months in advance to schedule this appointment (call in late July to schedule).  You may see Kathlyn Sacramento, MD or one of the following Advanced Practice Providers on your designated Care Team:   Murray Hodgkins, NP Christell Faith, PA-C . Marrianne Mood, PA-C

## 2018-09-25 ENCOUNTER — Other Ambulatory Visit: Payer: Self-pay

## 2018-09-25 ENCOUNTER — Ambulatory Visit (INDEPENDENT_AMBULATORY_CARE_PROVIDER_SITE_OTHER): Payer: Medicare Other

## 2018-09-25 DIAGNOSIS — I4891 Unspecified atrial fibrillation: Secondary | ICD-10-CM | POA: Diagnosis not present

## 2018-09-25 DIAGNOSIS — Z5181 Encounter for therapeutic drug level monitoring: Secondary | ICD-10-CM | POA: Diagnosis not present

## 2018-09-25 LAB — POCT INR: INR: 2.8 (ref 2.0–3.0)

## 2018-09-25 NOTE — Patient Instructions (Signed)
Please continue dosage of 1.5 pills every day except 1 pill on Mondays & Fridays. Recheck in 4 weeks.

## 2018-10-08 ENCOUNTER — Other Ambulatory Visit: Payer: Self-pay | Admitting: Podiatry

## 2018-10-08 ENCOUNTER — Other Ambulatory Visit: Payer: Self-pay

## 2018-10-08 ENCOUNTER — Ambulatory Visit
Admission: RE | Admit: 2018-10-08 | Discharge: 2018-10-08 | Disposition: A | Discharge status: Home / Self Care | Payer: Medicare Other | Source: Ambulatory Visit | Attending: Podiatry | Admitting: Podiatry

## 2018-10-08 DIAGNOSIS — M79662 Pain in left lower leg: Secondary | ICD-10-CM | POA: Insufficient documentation

## 2018-10-08 DIAGNOSIS — M7122 Synovial cyst of popliteal space [Baker], left knee: Secondary | ICD-10-CM | POA: Diagnosis not present

## 2018-10-08 DIAGNOSIS — L97509 Non-pressure chronic ulcer of other part of unspecified foot with unspecified severity: Secondary | ICD-10-CM | POA: Diagnosis not present

## 2018-10-08 DIAGNOSIS — M7989 Other specified soft tissue disorders: Secondary | ICD-10-CM | POA: Insufficient documentation

## 2018-10-08 DIAGNOSIS — E11621 Type 2 diabetes mellitus with foot ulcer: Secondary | ICD-10-CM | POA: Diagnosis not present

## 2018-10-08 DIAGNOSIS — Z89412 Acquired absence of left great toe: Secondary | ICD-10-CM | POA: Diagnosis not present

## 2018-10-08 DIAGNOSIS — M79605 Pain in left leg: Secondary | ICD-10-CM | POA: Diagnosis not present

## 2018-10-21 ENCOUNTER — Other Ambulatory Visit: Payer: Self-pay | Admitting: Cardiovascular Disease

## 2018-10-21 ENCOUNTER — Telehealth: Payer: Self-pay

## 2018-10-21 NOTE — Telephone Encounter (Signed)
Attempted to contact pt to prescreen for COVID19 prior to INR appt on Wed, 7/15. Advised pt of appt time and that she will need to enter thru the [place] entrance of [facility] and make sure she has her mask. Asked her to call back w/ any questions or concerns.

## 2018-10-21 NOTE — Telephone Encounter (Signed)
Refill Request.  

## 2018-10-23 ENCOUNTER — Other Ambulatory Visit: Payer: Self-pay

## 2018-10-23 ENCOUNTER — Ambulatory Visit (INDEPENDENT_AMBULATORY_CARE_PROVIDER_SITE_OTHER): Payer: Medicare Other

## 2018-10-23 DIAGNOSIS — Z5181 Encounter for therapeutic drug level monitoring: Secondary | ICD-10-CM | POA: Diagnosis not present

## 2018-10-23 DIAGNOSIS — I4891 Unspecified atrial fibrillation: Secondary | ICD-10-CM

## 2018-10-23 LAB — POCT INR: INR: 2.4 (ref 2.0–3.0)

## 2018-10-23 NOTE — Patient Instructions (Signed)
Please continue dosage of 1.5 pills every day except 1 pill on Mondays & Fridays. Recheck in 3 weeks.

## 2018-11-11 ENCOUNTER — Telehealth: Payer: Self-pay

## 2018-11-11 NOTE — Telephone Encounter (Signed)
Attempted to contact pt to prescreen for COVID19 prior to appt for INR check on Wed, 8/5. Left message on vm w/ appt time, asked her to call back if she will be unable to keep this appt.

## 2018-11-13 ENCOUNTER — Ambulatory Visit (INDEPENDENT_AMBULATORY_CARE_PROVIDER_SITE_OTHER): Payer: Medicare Other

## 2018-11-13 ENCOUNTER — Other Ambulatory Visit: Payer: Self-pay

## 2018-11-13 DIAGNOSIS — Z5181 Encounter for therapeutic drug level monitoring: Secondary | ICD-10-CM | POA: Diagnosis not present

## 2018-11-13 DIAGNOSIS — I4891 Unspecified atrial fibrillation: Secondary | ICD-10-CM | POA: Diagnosis not present

## 2018-11-13 LAB — POCT INR: INR: 3.3 — AB (ref 2.0–3.0)

## 2018-11-13 NOTE — Patient Instructions (Signed)
Please 1 tablet tonight, have a large serving of greens today, then continue dosage of 1.5 pills every day except 1 pill on Mondays & Fridays. Recheck in 4 weeks.

## 2018-11-15 ENCOUNTER — Other Ambulatory Visit: Payer: Self-pay | Admitting: Cardiovascular Disease

## 2018-11-24 ENCOUNTER — Other Ambulatory Visit: Payer: Self-pay | Admitting: Cardiovascular Disease

## 2018-11-25 NOTE — Telephone Encounter (Signed)
Refill Request.  

## 2018-12-03 DIAGNOSIS — I10 Essential (primary) hypertension: Secondary | ICD-10-CM | POA: Diagnosis not present

## 2018-12-03 DIAGNOSIS — E1142 Type 2 diabetes mellitus with diabetic polyneuropathy: Secondary | ICD-10-CM | POA: Diagnosis not present

## 2018-12-03 DIAGNOSIS — E1169 Type 2 diabetes mellitus with other specified complication: Secondary | ICD-10-CM | POA: Diagnosis not present

## 2018-12-03 DIAGNOSIS — E785 Hyperlipidemia, unspecified: Secondary | ICD-10-CM | POA: Diagnosis not present

## 2018-12-03 DIAGNOSIS — E1159 Type 2 diabetes mellitus with other circulatory complications: Secondary | ICD-10-CM | POA: Diagnosis not present

## 2018-12-10 ENCOUNTER — Encounter (INDEPENDENT_AMBULATORY_CARE_PROVIDER_SITE_OTHER): Payer: Self-pay | Admitting: Nurse Practitioner

## 2018-12-10 ENCOUNTER — Ambulatory Visit (INDEPENDENT_AMBULATORY_CARE_PROVIDER_SITE_OTHER): Payer: Medicare Other | Admitting: Nurse Practitioner

## 2018-12-10 ENCOUNTER — Other Ambulatory Visit: Payer: Self-pay

## 2018-12-10 VITALS — BP 118/80 | HR 78 | Resp 16

## 2018-12-10 DIAGNOSIS — E785 Hyperlipidemia, unspecified: Secondary | ICD-10-CM | POA: Diagnosis not present

## 2018-12-10 DIAGNOSIS — I10 Essential (primary) hypertension: Secondary | ICD-10-CM

## 2018-12-10 DIAGNOSIS — E1169 Type 2 diabetes mellitus with other specified complication: Secondary | ICD-10-CM

## 2018-12-10 DIAGNOSIS — M7989 Other specified soft tissue disorders: Secondary | ICD-10-CM

## 2018-12-10 DIAGNOSIS — I2510 Atherosclerotic heart disease of native coronary artery without angina pectoris: Secondary | ICD-10-CM

## 2018-12-11 ENCOUNTER — Ambulatory Visit (INDEPENDENT_AMBULATORY_CARE_PROVIDER_SITE_OTHER): Payer: Medicare Other

## 2018-12-11 DIAGNOSIS — I4891 Unspecified atrial fibrillation: Secondary | ICD-10-CM | POA: Diagnosis not present

## 2018-12-11 DIAGNOSIS — Z5181 Encounter for therapeutic drug level monitoring: Secondary | ICD-10-CM

## 2018-12-11 LAB — POCT INR: INR: 4.8 — AB (ref 2.0–3.0)

## 2018-12-11 NOTE — Patient Instructions (Signed)
Please skip coumadin tonight & tomorrow, then continue dosage of 1.5 pills every day except 1 pill on Mondays & Fridays. Recheck in 2 weeks.

## 2018-12-12 ENCOUNTER — Encounter (INDEPENDENT_AMBULATORY_CARE_PROVIDER_SITE_OTHER): Payer: Self-pay | Admitting: Nurse Practitioner

## 2018-12-12 NOTE — Progress Notes (Signed)
SUBJECTIVE:  Patient ID: Meghan [name], female    DOB: May 25, 1950, 68 y.o.   MRN: [id number] Chief Complaint  Patient presents with   New Patient (Initial Visit)    ref [facility] for left le swelling    HPI  Meghan [name] is a 68 y.o. female referred by Dr. Dr. Elvina Mattes due to left lower extremity pain and swelling.  The patient has had a history of amputation of the left great toe and she is also had a flexor tendon release done on her second toe.  Overall her foot is doing well however there is extreme swelling of the left lower ankle as well as foot that are causing her problems with ambulation as well as wearing her diabetic shoes.  The patient denies any precipitating event.  She states that the swelling started and slowly became worse over time.  She does have some ambulation issues due to prior CVA as well as amputation.  She denies any chest pain, fever, nausea, vomiting or diarrhea.  Past Medical History:  Diagnosis Date   Carotid arterial disease ([place])    a. 02/2012 s/p L CEA.   Coronary artery disease    a. 02/2012 [facility] MV: Anterior, anteroseptal, septal, apical infarct with mild peri-infarct ischemia, EF 41%- medically managed due to anemia; b. 03/2017 Cath: LM nl, LAD 99ost/p/m - faint L->L collaterals, RI 20ost, LCX 74m, RCA 20p, 80m-->Med Rx for chronic subtotal LAD dzs. Avoid R Radial access in future.   Diabetes mellitus without complication ([place])    Type II   Hyperlipidemia    Hypertension    Ischemic cardiomyopathy    a. 06/2013 Echo: EF 35-40%, glob HK, mildly dil LA, mild LVH, mild TR, mild to mod MR; b. 07/2016 Echo: EF 25-30%, sev mid-apicalanteroseptal, ant, apical HK, mild MR, sev dil LA, mod dil RA, mod TR, PASP 89mmHg; c. 11/2016 Echo: EF 20-25%, antsept DK, Gr2 DD, mild MR, mod dil LA/RV/RA, mod red RV fxn, mod-sev TR, PASP 40-33mmHg.   Morbid obesity (H[facility])    Normocytic anemia    Persistent atrial fibrillation    a. CHA2DS2VASc = 7-->coumadin; maintaining  sinus on amio.   Stroke [facility]) 2013    Past Surgical History:  Procedure Laterality Date   AMPUTATION TOE Left 09/22/2016   Procedure: AMPUTATION TOE;  Surgeon: Albertine Patricia, DPM;  Location: [facility] ORS;  Service: Podiatry;  Laterality: Left;   CARDIOVERSION N/A 07/28/2016   Procedure: CARDIOVERSION;  Surgeon: Wellington Hampshire, MD;  Location: [facility] ORS;  Service: Cardiovascular;  Laterality: N/A;   CARDIOVERSION N/A 08/07/2016   Procedure: CARDIOVERSION;  Surgeon: Wellington Hampshire, MD;  Location: [facility] ORS;  Service: Cardiovascular;  Laterality: N/A;   CAROTID ENDARTERECTOMY     [facility]; Dr. Lucky Cowboy   CHOLECYSTECTOMY     foot infection Right    LEG SURGERY     right;infection   RIGHT/LEFT HEART CATH AND CORONARY ANGIOGRAPHY Bilateral 03/12/2017   Procedure: RIGHT/LEFT HEART CATH AND CORONARY ANGIOGRAPHY;  Surgeon: Wellington Hampshire, MD;  Location: [place] [facility];  Service: Cardiovascular;  Laterality: Bilateral;   TONSILLECTOMY      Social History   Socioeconomic History   Marital status: Widowed    Spouse name: Not on file   Number of children: Not on file   Years of education: Not on file   Highest education level: Not on file  Occupational History   Not on file  Social Needs   Financial resource strain: Not on file  Food insecurity    Worry: Not on file    Inability: Not on file   Transportation needs    Medical: Not on file    Non-medical: Not on file  Tobacco Use   Smoking status: Former Smoker    Packs/day: 0.50    Years: 5.00    Pack years: 2.50    Types: Cigarettes   Smokeless tobacco: Never Used  Substance and Sexual Activity   Alcohol use: No   Drug use: No   Sexual activity: Not Currently  Lifestyle   Physical activity    Days per week: Not on file    Minutes per session: Not on file   Stress: Not on file  Relationships   Social connections    Talks on phone: Not on file    Gets together: Not on file    Attends religious  service: Not on file    Active member of club or organization: Not on file    Attends meetings of clubs or organizations: Not on file    Relationship status: Not on file   Intimate partner violence    Fear of current or ex partner: Not on file    Emotionally abused: Not on file    Physically abused: Not on file    Forced sexual activity: Not on file  Other Topics Concern   Not on file  Social History Narrative   Not on file    Family History  Problem Relation Age of Onset   Heart disease Mother    Diabetes Mother    Heart disease Father    Hypertension Father    Diabetes Maternal Grandmother     No Known Allergies   Review of Systems   Review of Systems: Negative Unless Checked Constitutional: [] Weight loss  [] Fever  [] Chills Cardiac: [] Chest pain   []  Atrial Fibrillation  [] Palpitations   [] Shortness of breath when laying flat   [x] Shortness of breath with exertion. [] Shortness of breath at rest Vascular:  [] Pain in legs with walking   [] Pain in legs with standing [] Pain in legs when laying flat   [] Claudication    [] Pain in feet when laying flat    [] History of DVT   [] Phlebitis   [x] Swelling in legs   [] Varicose veins   [] Non-healing ulcers Pulmonary:   [] Uses home oxygen   [] Productive cough   [] Hemoptysis   [] Wheeze  [] COPD   [] Asthma Neurologic:  [] Dizziness   [] Seizures  [] Blackouts [x] History of stroke   [] History of TIA  [] Aphasia   [] Temporary Blindness   [] Weakness or numbness in arm   [x] Weakness or numbness in leg Musculoskeletal:   [] Joint swelling   [] Joint pain   [] Low back pain  []  History of Knee Replacement [] Arthritis [] back Surgeries  []  Spinal Stenosis    Hematologic:  [] Easy bruising  [] Easy bleeding   [] Hypercoagulable state   [x] Anemic Gastrointestinal:  [] Diarrhea   [] Vomiting  [] Gastroesophageal reflux/heartburn   [] Difficulty swallowing. [] Abdominal pain Genitourinary:  [x] Chronic kidney disease   [] Difficult urination  [] Anuric   [] Blood in  urine [] Frequent urination  [] Burning with urination   [] Hematuria Skin:  [] Rashes   [] Ulcers [] Wounds Psychological:  [] History of anxiety   []  History of major depression  []  Memory Difficulties      OBJECTIVE:   Physical Exam  BP 118/80 (BP Location: Right Arm)    Pulse 78    Resp 16   Gen: WD/WN, NAD Head: [place]/AT, No temporalis wasting.  Ear/Nose/Throat: Hearing  grossly intact, nares w/o erythema or drainage Eyes: PER, EOMI, sclera nonicteric.  Neck: Supple, no masses.  No JVD.  Pulmonary:  Good air movement, no use of accessory muscles.  Cardiac: RRR Vascular: 4+ edema of left lower extremity Vessel Right Left  Radial Palpable Palpable   Gastrointestinal: soft, non-distended. No guarding/no peritoneal signs.  Musculoskeletal: Patient wheelchair-bound..  No deformity or atrophy.  Neurologic: Pain and light touch intact in extremities.  Symmetrical.  Speech is fluent. Motor exam as listed above. Psychiatric: Judgment intact, Mood & affect appropriate for pt's clinical situation. Dermatologic: No Venous rashes. No Ulcers Noted.  No changes consistent with cellulitis. Lymph : No Cervical lymphadenopathy, no lichenification or skin changes of chronic lymphedema.       ASSESSMENT AND PLAN:  1. Leg swelling Today we will place the patient in [place] wraps due to the fact that she has some extensive swelling in her on her left lower extremity.  I have spoken with the patient about conservative therapy however the extensive swelling will not allow the patient to place a compression sock on.  We will have the patient in [place] wraps to be changed weekly in our office and in 4 weeks we will follow to evaluate the leg swelling as well as to obtain noninvasive studies to determine what possible causes as well as interventions for her edema.  In the meantime she is advised to continue with other conservative therapy treatment methods such as elevation of her lower extremity. - VAS [place] LOWER  EXTREMITY VENOUS REFLUX; Future  2. Hyperlipidemia due to type 2 diabetes mellitus ([place]) Continue statin as ordered and reviewed, no changes at this time   3. Essential hypertension Continue antihypertensive medications as already ordered, these medications have been reviewed and there are no changes at this time.    Current Outpatient Medications on File Prior to Visit  Medication Sig Dispense Refill   amiodarone (PACERONE) 200 MG tablet Take 1/2 (one-half) tablet by mouth once daily 45 tablet 1   atorvastatin (LIPITOR) 40 MG tablet Take 1 tablet by mouth once daily 90 tablet 0   carvedilol (COREG) 6.25 MG tablet TAKE 1 TABLET BY MOUTH TWICE DAILY WITH A MEAL 180 tablet 0   ENTRESTO 97-103 MG Take 1 tablet by mouth twice daily (Patient taking differently: 1 tablet 2 (two) times daily. ) 60 tablet 3   ferrous sulfate 325 (65 FE) MG EC tablet Take 325 mg by mouth daily with breakfast.     furosemide (LASIX) 40 MG tablet Take 1 tablet (40 mg total) by mouth daily. (Patient taking differently: Take 20 mg by mouth daily. Dr Fletcher Anon) 90 tablet 3   glipiZIDE (GLUCOTROL) 10 MG tablet Take 10 mg by mouth 2 (two) times daily. Dr Honor Junes     Insulin Degludec 200 UNIT/ML SOPN Dr Honor Junes     levothyroxine (SYNTHROID, LEVOTHROID) 25 MCG tablet Take 1 tablet (25 mcg total) by mouth daily before breakfast. (Patient taking differently: Take 25 mcg by mouth daily before breakfast. Dr Fletcher Anon) 30 tablet 11   triamcinolone cream (KENALOG) 0.5 % Apply 1 application topically 2 (two) times daily.     warfarin (COUMADIN) 5 MG tablet USE AS DIRECTED BY COUMADIN CLINIC FOR 30 DAYS 45 tablet 3   No current facility-administered medications on file prior to visit.     There are no Patient Instructions on file for this visit. No follow-ups on file.   Kris Hartmann, NP  This note was completed with Viviann Spare  Dictation.  Any errors are purely unintentional.

## 2018-12-13 ENCOUNTER — Encounter: Payer: Self-pay | Admitting: Emergency Medicine

## 2018-12-13 ENCOUNTER — Emergency Department: Payer: Medicare Other

## 2018-12-13 ENCOUNTER — Other Ambulatory Visit: Payer: Self-pay

## 2018-12-13 ENCOUNTER — Inpatient Hospital Stay
Admission: EM | Admit: 2018-12-13 | Discharge: 2018-12-15 | DRG: 291 | Disposition: A | Discharge status: Home / Self Care | Payer: Medicare Other | Attending: Internal Medicine | Admitting: Internal Medicine

## 2018-12-13 DIAGNOSIS — E785 Hyperlipidemia, unspecified: Secondary | ICD-10-CM | POA: Diagnosis present

## 2018-12-13 DIAGNOSIS — E1122 Type 2 diabetes mellitus with diabetic chronic kidney disease: Secondary | ICD-10-CM | POA: Diagnosis present

## 2018-12-13 DIAGNOSIS — Z6841 Body Mass Index (BMI) 40.0 and over, adult: Secondary | ICD-10-CM | POA: Diagnosis not present

## 2018-12-13 DIAGNOSIS — Z87891 Personal history of nicotine dependence: Secondary | ICD-10-CM | POA: Diagnosis not present

## 2018-12-13 DIAGNOSIS — I272 Pulmonary hypertension, unspecified: Secondary | ICD-10-CM | POA: Diagnosis present

## 2018-12-13 DIAGNOSIS — Z79899 Other long term (current) drug therapy: Secondary | ICD-10-CM

## 2018-12-13 DIAGNOSIS — J9621 Acute and chronic respiratory failure with hypoxia: Secondary | ICD-10-CM | POA: Diagnosis present

## 2018-12-13 DIAGNOSIS — Z833 Family history of diabetes mellitus: Secondary | ICD-10-CM | POA: Diagnosis not present

## 2018-12-13 DIAGNOSIS — E039 Hypothyroidism, unspecified: Secondary | ICD-10-CM | POA: Diagnosis present

## 2018-12-13 DIAGNOSIS — E11649 Type 2 diabetes mellitus with hypoglycemia without coma: Secondary | ICD-10-CM | POA: Diagnosis present

## 2018-12-13 DIAGNOSIS — Z8249 Family history of ischemic heart disease and other diseases of the circulatory system: Secondary | ICD-10-CM

## 2018-12-13 DIAGNOSIS — I4819 Other persistent atrial fibrillation: Secondary | ICD-10-CM | POA: Diagnosis present

## 2018-12-13 DIAGNOSIS — Z20828 Contact with and (suspected) exposure to other viral communicable diseases: Secondary | ICD-10-CM | POA: Diagnosis present

## 2018-12-13 DIAGNOSIS — I5023 Acute on chronic systolic (congestive) heart failure: Secondary | ICD-10-CM

## 2018-12-13 DIAGNOSIS — E119 Type 2 diabetes mellitus without complications: Secondary | ICD-10-CM | POA: Diagnosis not present

## 2018-12-13 DIAGNOSIS — E118 Type 2 diabetes mellitus with unspecified complications: Secondary | ICD-10-CM | POA: Diagnosis not present

## 2018-12-13 DIAGNOSIS — Z9049 Acquired absence of other specified parts of digestive tract: Secondary | ICD-10-CM

## 2018-12-13 DIAGNOSIS — N183 Chronic kidney disease, stage 3 (moderate): Secondary | ICD-10-CM | POA: Diagnosis present

## 2018-12-13 DIAGNOSIS — I255 Ischemic cardiomyopathy: Secondary | ICD-10-CM | POA: Diagnosis present

## 2018-12-13 DIAGNOSIS — N179 Acute kidney failure, unspecified: Secondary | ICD-10-CM | POA: Diagnosis present

## 2018-12-13 DIAGNOSIS — E876 Hypokalemia: Secondary | ICD-10-CM | POA: Diagnosis present

## 2018-12-13 DIAGNOSIS — I25118 Atherosclerotic heart disease of native coronary artery with other forms of angina pectoris: Secondary | ICD-10-CM

## 2018-12-13 DIAGNOSIS — I340 Nonrheumatic mitral (valve) insufficiency: Secondary | ICD-10-CM | POA: Diagnosis not present

## 2018-12-13 DIAGNOSIS — I130 Hypertensive heart and chronic kidney disease with heart failure and stage 1 through stage 4 chronic kidney disease, or unspecified chronic kidney disease: Secondary | ICD-10-CM | POA: Diagnosis present

## 2018-12-13 DIAGNOSIS — I420 Dilated cardiomyopathy: Secondary | ICD-10-CM

## 2018-12-13 DIAGNOSIS — I071 Rheumatic tricuspid insufficiency: Secondary | ICD-10-CM | POA: Diagnosis present

## 2018-12-13 DIAGNOSIS — J9601 Acute respiratory failure with hypoxia: Secondary | ICD-10-CM

## 2018-12-13 DIAGNOSIS — G9341 Metabolic encephalopathy: Secondary | ICD-10-CM | POA: Diagnosis present

## 2018-12-13 DIAGNOSIS — Z7901 Long term (current) use of anticoagulants: Secondary | ICD-10-CM

## 2018-12-13 DIAGNOSIS — I5043 Acute on chronic combined systolic (congestive) and diastolic (congestive) heart failure: Secondary | ICD-10-CM | POA: Diagnosis present

## 2018-12-13 DIAGNOSIS — Z8673 Personal history of transient ischemic attack (TIA), and cerebral infarction without residual deficits: Secondary | ICD-10-CM

## 2018-12-13 DIAGNOSIS — I361 Nonrheumatic tricuspid (valve) insufficiency: Secondary | ICD-10-CM | POA: Diagnosis not present

## 2018-12-13 DIAGNOSIS — R0602 Shortness of breath: Secondary | ICD-10-CM | POA: Diagnosis not present

## 2018-12-13 DIAGNOSIS — Z89422 Acquired absence of other left toe(s): Secondary | ICD-10-CM | POA: Diagnosis not present

## 2018-12-13 DIAGNOSIS — E162 Hypoglycemia, unspecified: Secondary | ICD-10-CM

## 2018-12-13 DIAGNOSIS — E1142 Type 2 diabetes mellitus with diabetic polyneuropathy: Secondary | ICD-10-CM | POA: Diagnosis present

## 2018-12-13 DIAGNOSIS — Z79890 Hormone replacement therapy (postmenopausal): Secondary | ICD-10-CM

## 2018-12-13 DIAGNOSIS — Z794 Long term (current) use of insulin: Secondary | ICD-10-CM

## 2018-12-13 DIAGNOSIS — E1165 Type 2 diabetes mellitus with hyperglycemia: Secondary | ICD-10-CM | POA: Diagnosis present

## 2018-12-13 HISTORY — DX: Chronic kidney disease, stage 3b: N18.32

## 2018-12-13 LAB — GLUCOSE, CAPILLARY
Glucose-Capillary: 93 mg/dL (ref 70–99)
Glucose-Capillary: 98 mg/dL (ref 70–99)
Glucose-Capillary: 75 mg/dL (ref 70–99)
Glucose-Capillary: 28 mg/dL — CL (ref 70–99)
Glucose-Capillary: 132 mg/dL — ABNORMAL HIGH (ref 70–99)
Glucose-Capillary: 133 mg/dL — ABNORMAL HIGH (ref 70–99)

## 2018-12-13 LAB — CBC
HCT: 36.9 % (ref 36.0–46.0)
Hemoglobin: 11.3 g/dL — ABNORMAL LOW (ref 12.0–15.0)
MCH: 26.7 pg (ref 26.0–34.0)
MCHC: 30.6 g/dL (ref 30.0–36.0)
MCV: 87.2 fL (ref 80.0–100.0)
Platelets: 169 10*3/uL (ref 150–400)
RBC: 4.23 MIL/uL (ref 3.87–5.11)
RDW: 17.4 % — ABNORMAL HIGH (ref 11.5–15.5)
WBC: 6.1 10*3/uL (ref 4.0–10.5)
nRBC: 0 % (ref 0.0–0.2)

## 2018-12-13 LAB — BASIC METABOLIC PANEL
Anion gap: 11 (ref 5–15)
BUN: 27 mg/dL — ABNORMAL HIGH (ref 8–23)
CO2: 26 mmol/L (ref 22–32)
Calcium: 8.2 mg/dL — ABNORMAL LOW (ref 8.9–10.3)
Chloride: 102 mmol/L (ref 98–111)
Creatinine, Ser: 1.8 mg/dL — ABNORMAL HIGH (ref 0.44–1.00)
GFR calc Af Amer: 32 mL/min — ABNORMAL LOW (ref >60)
GFR calc non Af Amer: 28 mL/min — ABNORMAL LOW (ref >60)
Glucose, Bld: 106 mg/dL — ABNORMAL HIGH (ref 70–99)
Potassium: 3.3 mmol/L — ABNORMAL LOW (ref 3.5–5.1)
Sodium: 139 mmol/L (ref 135–145)

## 2018-12-13 LAB — URINALYSIS, COMPLETE (UACMP) WITH MICROSCOPIC
Bilirubin Urine: NEGATIVE
Glucose, UA: NEGATIVE mg/dL
Ketones, ur: NEGATIVE mg/dL
Nitrite: NEGATIVE
Protein, ur: 30 mg/dL — AB
Specific Gravity, Urine: 1.009 (ref 1.005–1.030)
pH: 5 (ref 5.0–8.0)

## 2018-12-13 LAB — TROPONIN I (HIGH SENSITIVITY)
Troponin I (High Sensitivity): 12 ng/L (ref <18)
Troponin I (High Sensitivity): 12 ng/L (ref <18)

## 2018-12-13 LAB — SARS CORONAVIRUS 2 BY RT PCR (HOSPITAL ORDER, PERFORMED IN ~~LOC~~ HOSPITAL LAB): SARS Coronavirus 2: NEGATIVE

## 2018-12-13 LAB — PROTIME-INR
INR: 2.1 — ABNORMAL HIGH (ref 0.8–1.2)
Prothrombin Time: 23 seconds — ABNORMAL HIGH (ref 11.4–15.2)

## 2018-12-13 LAB — APTT: aPTT: 47 seconds — ABNORMAL HIGH (ref 24–36)

## 2018-12-13 LAB — BRAIN NATRIURETIC PEPTIDE: B Natriuretic Peptide: 1269 pg/mL — ABNORMAL HIGH (ref 0.0–100.0)

## 2018-12-13 MED ORDER — WARFARIN - PHARMACIST DOSING INPATIENT
Freq: Every day | Status: DC
  Administered 2018-12-13 – 2018-12-14 (×2)
  Filled 2018-12-13: qty 1

## 2018-12-13 MED ORDER — ATORVASTATIN CALCIUM 20 MG PO TABS
40.0000 mg | ORAL_TABLET | Freq: Every day | ORAL | Status: DC
  Administered 2018-12-13 – 2018-12-15 (×3): 40 mg via ORAL
  Filled 2018-12-13 (×3): qty 2

## 2018-12-13 MED ORDER — SODIUM CHLORIDE 0.9% FLUSH
3.0000 mL | Freq: Once | INTRAVENOUS | Status: AC
  Administered 2018-12-13: 3 mL via INTRAVENOUS

## 2018-12-13 MED ORDER — INSULIN DEGLUDEC 200 UNIT/ML ~~LOC~~ SOPN: 28.0000 [IU] | PEN_INJECTOR | Freq: Every day | SUBCUTANEOUS | Status: DC

## 2018-12-13 MED ORDER — ONDANSETRON HCL 4 MG/2ML IJ SOLN: 4.0000 mg | Freq: Four times a day (QID) | INTRAMUSCULAR | Status: DC | PRN

## 2018-12-13 MED ORDER — INSULIN ASPART 100 UNIT/ML ~~LOC~~ SOLN
0.0000 [IU] | Freq: Every day | SUBCUTANEOUS | Status: DC
  Administered 2018-12-14: 3 [IU] via SUBCUTANEOUS
  Filled 2018-12-13: qty 1

## 2018-12-13 MED ORDER — DEXTROSE 50 % IV SOLN
1.0000 | Freq: Once | INTRAVENOUS | Status: DC
  Filled 2018-12-13: qty 50

## 2018-12-13 MED ORDER — FERROUS SULFATE 325 (65 FE) MG PO TABS
325.0000 mg | ORAL_TABLET | Freq: Every day | ORAL | Status: DC
  Filled 2018-12-13 (×2): qty 1

## 2018-12-13 MED ORDER — INSULIN GLARGINE 100 UNIT/ML ~~LOC~~ SOLN
28.0000 [IU] | Freq: Every day | SUBCUTANEOUS | Status: DC
  Filled 2018-12-13 (×2): qty 0.28

## 2018-12-13 MED ORDER — LEVOTHYROXINE SODIUM 25 MCG PO TABS
25.0000 ug | ORAL_TABLET | Freq: Every day | ORAL | Status: DC
  Administered 2018-12-14 – 2018-12-15 (×2): 25 ug via ORAL
  Filled 2018-12-13 (×2): qty 1

## 2018-12-13 MED ORDER — AMIODARONE HCL 200 MG PO TABS
100.0000 mg | ORAL_TABLET | Freq: Every day | ORAL | Status: DC
  Administered 2018-12-13 – 2018-12-14 (×2): 100 mg via ORAL
  Filled 2018-12-13 (×2): qty 1

## 2018-12-13 MED ORDER — FUROSEMIDE 10 MG/ML IJ SOLN
40.0000 mg | Freq: Two times a day (BID) | INTRAMUSCULAR | Status: DC
  Administered 2018-12-14 – 2018-12-15 (×4): 40 mg via INTRAVENOUS
  Filled 2018-12-13 (×4): qty 4

## 2018-12-13 MED ORDER — FUROSEMIDE 10 MG/ML IJ SOLN
40.0000 mg | Freq: Once | INTRAMUSCULAR | Status: AC
  Administered 2018-12-13: 13:00:00 40 mg via INTRAVENOUS
  Filled 2018-12-13: qty 4

## 2018-12-13 MED ORDER — GLIPIZIDE 10 MG PO TABS
10.0000 mg | ORAL_TABLET | Freq: Two times a day (BID) | ORAL | Status: DC
  Filled 2018-12-13: qty 1

## 2018-12-13 MED ORDER — CARVEDILOL 6.25 MG PO TABS
6.2500 mg | ORAL_TABLET | Freq: Two times a day (BID) | ORAL | Status: DC
  Administered 2018-12-13 – 2018-12-15 (×4): 6.25 mg via ORAL
  Filled 2018-12-13 (×4): qty 1

## 2018-12-13 MED ORDER — INSULIN ASPART 100 UNIT/ML ~~LOC~~ SOLN
0.0000 [IU] | Freq: Three times a day (TID) | SUBCUTANEOUS | Status: DC
  Administered 2018-12-14: 3 [IU] via SUBCUTANEOUS
  Administered 2018-12-15: 12:00:00 2 [IU] via SUBCUTANEOUS
  Filled 2018-12-13 (×2): qty 1

## 2018-12-13 MED ORDER — SACUBITRIL-VALSARTAN 97-103 MG PO TABS: 1.0000 | ORAL_TABLET | Freq: Two times a day (BID) | ORAL | Status: DC

## 2018-12-13 MED ORDER — SODIUM CHLORIDE 0.9 % IV SOLN: 250.0000 mL | INTRAVENOUS | Status: DC | PRN

## 2018-12-13 MED ORDER — WARFARIN SODIUM 7.5 MG PO TABS
7.5000 mg | ORAL_TABLET | Freq: Once | ORAL | Status: AC
  Administered 2018-12-13: 7.5 mg via ORAL
  Filled 2018-12-13 (×2): qty 1

## 2018-12-13 MED ORDER — POTASSIUM CHLORIDE CRYS ER 20 MEQ PO TBCR
20.0000 meq | EXTENDED_RELEASE_TABLET | Freq: Every day | ORAL | Status: DC
  Administered 2018-12-13 – 2018-12-15 (×3): 20 meq via ORAL
  Filled 2018-12-13 (×3): qty 1

## 2018-12-13 MED ORDER — ACETAMINOPHEN 325 MG PO TABS: 650.0000 mg | ORAL_TABLET | ORAL | Status: DC | PRN

## 2018-12-13 MED ORDER — SODIUM CHLORIDE 0.9% FLUSH
3.0000 mL | Freq: Two times a day (BID) | INTRAVENOUS | Status: DC
  Administered 2018-12-13 – 2018-12-15 (×5): 3 mL via INTRAVENOUS

## 2018-12-13 MED ORDER — TRIAMCINOLONE ACETONIDE 0.5 % EX CREA
1.0000 "application " | TOPICAL_CREAM | Freq: Two times a day (BID) | CUTANEOUS | Status: DC
  Administered 2018-12-13 – 2018-12-14 (×3): 1 via TOPICAL
  Filled 2018-12-13: qty 15

## 2018-12-13 MED ORDER — SODIUM CHLORIDE 0.9% FLUSH: 3.0000 mL | INTRAVENOUS | Status: DC | PRN

## 2018-12-13 MED ORDER — DEXTROSE 50 % IV SOLN
1.0000 | Freq: Once | INTRAVENOUS | Status: AC
  Administered 2018-12-13: 50 mL via INTRAVENOUS
  Filled 2018-12-13: qty 50

## 2018-12-13 NOTE — ED Notes (Signed)
Spoke with MD Jimmye Norman about pt presentation, see verbal orders in [facility]

## 2018-12-13 NOTE — Progress Notes (Signed)
Care Alignment Note  Advanced Directives Documents (Living Will, Power of Attorney) currently in the EHR documents reviewed.  Has the patient discussed their wishes with their family/healthcare power of attorney yes. How much does the family or healthcare power of attorney know about their wishes. Patient's daughter at bedside understands patient's clinical condition with history of CHF, atrial fibrillation on anticoagulation with Coumadin and diabetes mellitus  What does the patient/decision maker understand about their medical condition and the natural course of their disease.  Hypoglycemia and hypoxia.  Acute on chronic hypoxic respiratory failure.  Acute metabolic encephalopathy.  Acute kidney injury.  What is the patient/decision maker's biggest fear or concern for the future pain and suffering   What is the most important goal for this patient should their health condition worsen maintenance of function.  Current   Code Status: Full Code  Current code status has been reviewed/updated.  Time spent:19 minutes

## 2018-12-13 NOTE — Progress Notes (Addendum)
Talked to Dr. Stark Jock about patient's blood sugar of 26 then recheck is 28, gave orange juice and peanut butter with gram crackers, order to to give 1 ampule of D50 now then recheck blood sugar. Discontinue insulin and glipizide. RN will continue to monitor.

## 2018-12-13 NOTE — ED Notes (Signed)
Current cbg check is 64

## 2018-12-13 NOTE — ED Provider Notes (Signed)
[facility] Emergency Department Provider Note  ____________________________________________   First MD Initiated Contact with Patient 12/13/18 1042     (approximate)  I have reviewed the triage vital signs and the nursing notes.   HISTORY  Chief Complaint No chief complaint on file.    HPI Meghan [name] is a 68 y.o. female with prior coronary artery disease, hypertension, hyperlipidemia, ischemic cardiomyopathy, A. fib on Coumadin who presents with AMS with lethargy.  On EMS arrival patient sugar was 48.  Patient was given oral glucose and recheck was 39.  Patient was then was given 250 cc of D10 and repeat glucose was 162.  In triage patient was noted to be satting 82% on room air.  Patient was placed on 2 L of oxygen.  Patient does have a history of CHF and does take Lasix.  She takes 40 mg once daily.  She says that she has been taking this.  She did endorse feeling short of breath however for the past few days, that is intermittent, worse with exertion, better with rest.  This morning she says she felt some generalized weakness.  She said she slid on her butt but she did not hit her head.  She denies any chest pain.  Denies any fevers.  Denies any known coronavirus contacts.  She takes her insulin in the afternoon once a day.          Past Medical History:  Diagnosis Date  . Carotid arterial disease ([place])    a. 02/2012 s/p L CEA.  . Coronary artery disease    a. 02/2012 Lexi MV: Anterior, anteroseptal, septal, apical infarct with mild peri-infarct ischemia, EF 41%- medically managed due to anemia; b. 03/2017 Cath: LM nl, LAD 99ost/p/m - faint L->L collaterals, RI 20ost, LCX 58m, RCA 20p, 95m-->Med Rx for chronic subtotal LAD dzs. Avoid R Radial access in future.  . 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 Hyperlipidemia   . Hypertension   . Ischemic cardiomyopathy    a. 06/2013 Echo: EF 35-40%, glob HK, mildly dil LA, mild LVH, mild  TR, mild to mod MR; b. 07/2016 Echo: EF 25-30%, sev mid-apicalanteroseptal, ant, apical HK, mild MR, sev dil LA, mod dil RA, mod TR, PASP 36mmHg; c. 11/2016 Echo: EF 20-25%, antsept DK, Gr2 DD, mild MR, mod dil LA/RV/RA, mod red RV fxn, mod-sev TR, PASP 40-52mmHg.  . Morbid obesity ([place])   . Normocytic anemia   . Persistent atrial fibrillation    a. CHA2DS2VASc = 7-->coumadin; maintaining sinus on amio.  . Stroke [facility]) 2013    Patient Active Problem List   Diagnosis Date Noted  . Sepsis ([place]) 03/30/2017  . Elevated TSH 12/29/2016  . Psoriasis 12/26/2016  . Obesity, Class III, BMI 40-49.9 (morbid obesity) ([place]) 12/26/2016  . Toe osteomyelitis, left ([place]) 09/21/2016  . CKD (chronic kidney disease), stage III ([place]) 07/26/2016  . History of CVA (cerebrovascular accident) without residual deficits 09/03/2013  . Hyperlipidemia 09/03/2013  . Type 2 diabetes mellitus with peripheral neuropathy ([place]) 09/03/2013  . Hyperlipidemia due to type 2 diabetes mellitus ([place]) 09/03/2013  . Paroxysmal atrial fibrillation ([place]) 07/16/2013  . Ischemic cardiomyopathy 07/16/2013  . Hypertension 07/16/2013  . Chronic systolic heart failure ([place]) 04/12/2012  . Coronary atherosclerosis   . Dyspnea 03/01/2012    Past Surgical History:  Procedure Laterality Date  . AMPUTATION TOE Left 09/22/2016   Procedure: AMPUTATION TOE;  Surgeon: Albertine Patricia, DPM;  Location: [facility] ORS;  Service: Podiatry;  Laterality: Left;  . CARDIOVERSION N/A 07/28/2016   Procedure: CARDIOVERSION;  Surgeon: Wellington Hampshire, MD;  Location: [facility] ORS;  Service: Cardiovascular;  Laterality: N/A;  . CARDIOVERSION N/A 08/07/2016   Procedure: CARDIOVERSION;  Surgeon: Wellington Hampshire, MD;  Location: [facility] ORS;  Service: Cardiovascular;  Laterality: N/A;  . CAROTID ENDARTERECTOMY     [facility]; Dr. Lucky Cowboy  . CHOLECYSTECTOMY    . foot infection Right   . LEG SURGERY     right;infection  . RIGHT/LEFT HEART CATH AND CORONARY ANGIOGRAPHY Bilateral  03/12/2017   Procedure: RIGHT/LEFT HEART CATH AND CORONARY ANGIOGRAPHY;  Surgeon: Wellington Hampshire, MD;  Location: [place] CV LAB;  Service: Cardiovascular;  Laterality: Bilateral;  . TONSILLECTOMY      Prior to Admission medications   Medication Sig Start Date End Date Taking? Authorizing Provider  amiodarone (PACERONE) 200 MG tablet Take 1/2 (one-half) tablet by mouth once daily 11/15/18   Wellington Hampshire, MD  atorvastatin (LIPITOR) 40 MG tablet Take 1 tablet by mouth once daily 11/25/18   Wellington Hampshire, MD  carvedilol (COREG) 6.25 MG tablet TAKE 1 TABLET BY MOUTH TWICE DAILY WITH A MEAL 09/23/18   Arida, Mertie Clause, MD  ENTRESTO 97-103 MG Take 1 tablet by mouth twice daily Patient taking differently: 1 tablet 2 (two) times daily.  11/15/18   Wellington Hampshire, MD  ferrous sulfate 325 (65 FE) MG EC tablet Take 325 mg by mouth daily with breakfast.    [provider]  furosemide (LASIX) 40 MG tablet Take 1 tablet (40 mg total) by mouth daily. Patient taking differently: Take 20 mg by mouth daily. Dr Fletcher Anon 04/17/18   Wellington Hampshire, MD  glipiZIDE (GLUCOTROL) 10 MG tablet Take 10 mg by mouth 2 (two) times daily. Dr Honor Junes 08/13/15   [provider]  Insulin Degludec 200 UNIT/ML SOPN Dr Honor Junes 02/18/18   [provider]  levothyroxine (SYNTHROID, LEVOTHROID) 25 MCG tablet Take 1 tablet (25 mcg total) by mouth daily before breakfast. Patient taking differently: Take 25 mcg by mouth daily before breakfast. Dr Fletcher Anon 06/14/18   Wellington Hampshire, MD  triamcinolone cream (KENALOG) 0.5 % Apply 1 application topically 2 (two) times daily.    [provider]  warfarin (COUMADIN) 5 MG tablet USE AS DIRECTED BY COUMADIN CLINIC FOR 30 DAYS 11/25/18   Wellington Hampshire, MD    Allergies Patient has no known allergies.  Family History  Problem Relation Age of Onset  . Heart disease Mother   . Diabetes Mother   . Heart disease Father   . Hypertension Father    . Diabetes Maternal Grandmother     Social History Social History   Tobacco Use  . Smoking status: Former Smoker    Packs/day: 0.50    Years: 5.00    Pack years: 2.50    Types: Cigarettes  . Smokeless tobacco: Never Used  Substance Use Topics  . Alcohol use: No  . Drug use: No      Review of Systems Constitutional: No fever/chills, positive weakness Eyes: No visual changes. ENT: No sore throat. Cardiovascular: Denies chest pain. Respiratory: Positive shortness of breath Gastrointestinal: No abdominal pain.  No nausea, no vomiting.  No diarrhea.  No constipation. Genitourinary: Negative for dysuria. Musculoskeletal: Negative for back pain. Skin: Negative for rash. Neurological: Negative for headaches, focal weakness or numbness. All other ROS negative ____________________________________________   PHYSICAL EXAM:  VITAL SIGNS: ED Triage Vitals  Enc  Vitals Group     BP 12/13/18 1031 (!) 143/84     Pulse Rate 12/13/18 1031 60     Resp 12/13/18 1031 15     Temp 12/13/18 1031 98.2 F (36.8 C)     Temp Source 12/13/18 1031 Oral     SpO2 12/13/18 1031 90 %     Weight --      Height --      Head Circumference --      Peak Flow --      Pain Score 12/13/18 1021 0     Pain Loc --      Pain Edu? --      Excl. in [place]? --     Constitutional: Alert and oriented. Well appearing and in no acute distress. Eyes: Conjunctivae are normal. EOMI. Head: Atraumatic. Nose: No congestion/rhinnorhea. Mouth/Throat: Mucous membranes are moist.   Neck: No stridor. Trachea Midline. FROM Cardiovascular: Normal rate, regular rhythm. Grossly normal heart sounds.  Good peripheral circulation. Respiratory: Normal respiratory effort.  No retractions. Crackles bilaterally. Ashley in place . Gastrointestinal: Soft and nontender. No distention. No abdominal bruits.  Musculoskeletal: No lower extremity tenderness nor edema.  No joint effusions. Neurologic:  Normal speech and language. No gross  focal neurologic deficits are appreciated.  Skin:  Skin is warm, dry and intact. psoriasis skin rashes  Psychiatric: Mood and affect are normal. Speech and behavior are normal. GU: Deferred   ____________________________________________   LABS (all labs ordered are listed, but only abnormal results are displayed)  Labs Reviewed  BASIC METABOLIC PANEL - Abnormal; Notable for the following components:      Result Value   Potassium 3.3 (*)    Glucose, Bld 106 (*)    BUN 27 (*)    Creatinine, Ser 1.80 (*)    Calcium 8.2 (*)    GFR calc non Af Amer 28 (*)    GFR calc Af Amer 32 (*)    All other components within normal limits  CBC - Abnormal; Notable for the following components:   Hemoglobin 11.3 (*)    RDW 17.4 (*)    All other components within normal limits  BRAIN NATRIURETIC PEPTIDE - Abnormal; Notable for the following components:   B Natriuretic Peptide 1,269.0 (*)    All other components within normal limits  PROTIME-INR - Abnormal; Notable for the following components:   Prothrombin Time 23.0 (*)    INR 2.1 (*)    All other components within normal limits  APTT - Abnormal; Notable for the following components:   aPTT 47 (*)    All other components within normal limits  SARS CORONAVIRUS 2 (HOSPITAL ORDER, [place] [facility])  GLUCOSE, CAPILLARY  GLUCOSE, CAPILLARY  GLUCOSE, CAPILLARY  URINALYSIS, COMPLETE (UACMP) WITH MICROSCOPIC  CBG MONITORING, ED  TROPONIN I (HIGH SENSITIVITY)  TROPONIN I (HIGH SENSITIVITY)   ____________________________________________   ED ECG REPORT I, Vanessa Wolfdale, the attending physician, personally viewed and interpreted this ECG.  EKG shows atrial fibrillation rate of 73, no ST elevation, T wave inversion aVL, QTC of 499 ____________________________________________  RADIOLOGY Robert Bellow, personally viewed and evaluated these images (plain radiographs) as part of my medical decision making, as well as reviewing  the written report by the radiologist.  ED MD interpretation: Interstitial edema bilaterally  Official radiology report(s): Dg Chest Portable 1 View  Result Date: 12/13/2018 CLINICAL DATA:  Shortness of breath. EXAM: PORTABLE CHEST 1 VIEW COMPARISON:  Chest x-ray dated  July 26, 2016. FINDINGS: The patient is rotated to the right. Stable cardiomegaly. New pulmonary vascular congestion and mild diffuse interstitial opacities. Central peribronchial thickening. No consolidation, pneumothorax, or large pleural effusion. No acute osseous abnormality. IMPRESSION: 1. Mild interstitial pulmonary edema. Electronically Signed   By: Titus Dubin M.D.   On: 12/13/2018 11:36    ____________________________________________   PROCEDURES  Procedure(s) performed (including Critical Care):  .Critical Care Performed by: Vanessa [place], MD Authorized by: Vanessa Montgomery, MD   Critical care provider statement:    Critical care time (minutes):  45   Critical care was necessary to treat or prevent imminent or life-threatening deterioration of the following conditions:  Respiratory failure   Critical care was time spent personally by me on the following activities:  Discussions with consultants, evaluation of patient's response to treatment, examination of patient, ordering and performing treatments and interventions, ordering and review of laboratory studies, ordering and review of radiographic studies, pulse oximetry, re-evaluation of patient's condition, obtaining history from patient or surrogate and review of old charts     ____________________________________________   INITIAL IMPRESSION / [place] / ED COURSE  Charleston Alfson was evaluated in Emergency Department on 12/13/2018 for the symptoms described in the history of present illness. She was evaluated in the context of the global COVID-19 pandemic, which necessitated consideration that the patient might be at risk for infection with the  SARS-CoV-2 virus that causes COVID-19. Institutional protocols and algorithms that pertain to the evaluation of patients at risk for COVID-19 are in a state of rapid change based on information released by regulatory bodies including the CDC and federal and state organizations. These policies and algorithms were followed during the patient's care in the ED.    Patient is a 68 year old who presented with generalized weakness, shortness of breath found to be hyperglycemic and hypoxic.  Patient's exam is consistent with fluid overload given bilateral crackles.  Will get electrolytes to evaluate for electrolyte abnormalities, AKI and plan to diurese patient.  Will get chest x-ray to evaluate for pneumonia, fluid, pneumothorax.  Consider PE but already on blood thinner.  will get coronavirus testing although denies  risk factors for this.  Patient has no fevers so lower suspicion for infection.  Patient did slip onto her butt.  I offered CT imaging to rule out intracranial hemorrhage although patient said that she not hit her head has no headache and feels that she is not at her mental baseline.  Patient does not want to have CT imaging at this time.  Currently is up from baseline 1.15 now at 1.8.   12:07 PM chest x-ray concerning for pulmonary edema.  We will give 1 dose of 40 of IV Lasix.  Repeat glucose was normal.  Given patient is on 2 L of oxygen a concern for fluid overload patient will need admission for continued diuresis.  Discussed with hospital team and they will admit patient ____________________________________________   FINAL CLINICAL IMPRESSION(S) / ED DIAGNOSES   Final diagnoses:  Acute respiratory failure with hypoxia ([place])  AKI (acute kidney injury) ([place])  Hypoglycemia      MEDICATIONS GIVEN DURING THIS VISIT:  Medications  sodium chloride flush (NS) 0.9 % injection 3 mL (3 mLs Intravenous Given by Other 12/13/18 1050)  furosemide (LASIX) injection 40 mg (40 mg Intravenous  Given 12/13/18 1246)     ED Discharge Orders    None       Note:  This document was prepared using  [employer] training and education officer and may include unintentional dictation errors.   Vanessa [place], MD 12/13/18 [phone]

## 2018-12-13 NOTE — ED Notes (Signed)
Pt initially placed on 4L via [place], sats up to 100% on 4L, O2 turned down to 2L via [place], sats remain 100% on 2L.

## 2018-12-13 NOTE — ED Triage Notes (Signed)
Pt from home via EMS with c/o lethargy. Per EMS pt glucose was 48 on arrival, was given oral glucose, recheck was 39, pt was then given 250 of D10 IV and glucose 162, VSS per EMS. Pt drowsy during triage. Pt A&Ox4

## 2018-12-13 NOTE — Consult Note (Signed)
Cardiology Consultation:   Patient ID: Meghan [name] MRN: [id number]; DOB: May 26, 1950  Admit date: 12/13/2018 Date of Consult: 12/13/2018  Primary Care Provider: [name], MD Primary Cardiologist: Kathlyn Sacramento, MD  Physician requesting consult: Dr. Stark Jock Reason for consult: Hypoxia, shortness of breath, CHF   Patient Profile:   Meghan [name] is a 68 y.o. female with a hx of chronic systolic heart failure, coronary artery disease, persistent atrial fibrillation on warfarin maintained in sinus rhythm with amiodarone, hypertension, hld, DM2, anemia, hyperlipidemia, and carotid artery disease s/p left carotid endarterectomy with previous CVA who is being seen today for the evaluation of acute on chronic systolic heart failure at the request of Dr. Stark Jock.  History of Present Illness:   On 12/13/2018, she presented to the emergency room with lethargy EMS responded, glucose was 48 on arrival, given oral glucose, recheck still low at 39, given 250 D10 IV She was drowsy in triage, noted to have hypoxia saturations in the 80s started on 4 L then down to 2 L nasal cannula , 100% saturation Reports compliance with her medications Reports having some shortness of breath for several days, worse with exertion Reports also having generalized weakness Takes insulin in the afternoon once a day Does not remember eating much today  Work reviewed BNP of 1269.  Troponin negative.    Chest x-ray showed mild congestion.    She was given 1 dose of IV Lasix.   At the time of admission, she was noted to be awake, alert, and oriented.  Her daughter was present at her bedside.    On my evaluation on the telemetry floor, mentating relatively well, repeat glucose levels around 30, even on recheck Was given orange juice, graham crackers peanut butter Dinner was ordered Reports that she had not had much to eat today  Other past medical history reviewed  echocardiogram 11/2016 which showed severely reduced LV  SF and EF 20 to 25%, mild MR, moderate to severe TR, and moderate pulmonary hypertension with systolic pulmonary pressure around 50 mmHg.    R/LHC performed 03/2017 showed significant one-vessel CAD with subtotal occlusion of the ostial and mid segment of the LAD with faint left to left collaterals.  There was mild to moderate left circumflex and RCA disease.  RHC showed moderately elevated filling pressure and wedge pressure of 23 mmHg, moderate pulmonary hypertension, and mildly reduced cardiac output.     Heart Pathway Score:     Past Medical History:  Diagnosis Date  . Carotid arterial disease ([place])    a. 02/2012 s/p L CEA.  . Coronary artery disease    a. 02/2012 [facility] MV: Anterior, anteroseptal, septal, apical infarct with mild peri-infarct ischemia, EF 41%- medically managed due to anemia; b. 03/2017 Cath: LM nl, LAD 99ost/p/m - faint L->L collaterals, RI 20ost, LCX 36m, RCA 20p, 26m-->Med Rx for chronic subtotal LAD dzs. Avoid R Radial access in future.  . 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 Hyperlipidemia   . Hypertension   . Ischemic cardiomyopathy    a. 06/2013 Echo: EF 35-40%, glob HK, mildly dil LA, mild LVH, mild TR, mild to mod MR; b. 07/2016 Echo: EF 25-30%, sev mid-apicalanteroseptal, ant, apical HK, mild MR, sev dil LA, mod dil RA, mod TR, PASP 77mmHg; c. 11/2016 Echo: EF 20-25%, antsept DK, Gr2 DD, mild MR, mod dil LA/RV/RA, mod red RV fxn, mod-sev TR, PASP 40-30mmHg.  . Morbid obesity ([place])   . Normocytic anemia   .  Persistent atrial fibrillation    a. CHA2DS2VASc = 7-->coumadin; maintaining sinus on amio.  . Stroke [facility]) 2013    Past Surgical History:  Procedure Laterality Date  . AMPUTATION TOE Left 09/22/2016   Procedure: AMPUTATION TOE;  Surgeon: Albertine Patricia, DPM;  Location: [facility] ORS;  Service: Podiatry;  Laterality: Left;  . CARDIOVERSION N/A 07/28/2016   Procedure: CARDIOVERSION;  Surgeon: Wellington Hampshire, MD;  Location: [facility] ORS;   Service: Cardiovascular;  Laterality: N/A;  . CARDIOVERSION N/A 08/07/2016   Procedure: CARDIOVERSION;  Surgeon: Wellington Hampshire, MD;  Location: [facility] ORS;  Service: Cardiovascular;  Laterality: N/A;  . CAROTID ENDARTERECTOMY     [facility]; Dr. Lucky Cowboy  . CHOLECYSTECTOMY    . foot infection Right   . LEG SURGERY     right;infection  . RIGHT/LEFT HEART CATH AND CORONARY ANGIOGRAPHY Bilateral 03/12/2017   Procedure: RIGHT/LEFT HEART CATH AND CORONARY ANGIOGRAPHY;  Surgeon: Wellington Hampshire, MD;  Location: [facility];  Service: Cardiovascular;  Laterality: Bilateral;  . TONSILLECTOMY       Home Medications:  Prior to Admission medications   Medication Sig Start Date End Date Taking? Authorizing Provider  amiodarone (PACERONE) 200 MG tablet Take 1/2 (one-half) tablet by mouth once daily Patient taking differently: Take 100 mg by mouth daily.  11/15/18  Yes Wellington Hampshire, MD  atorvastatin (LIPITOR) 40 MG tablet Take 1 tablet by mouth once daily 11/25/18  Yes Arida, Muhammad A, MD  carvedilol (COREG) 6.25 MG tablet TAKE 1 TABLET BY MOUTH TWICE DAILY WITH A MEAL 09/23/18  Yes Arida, Mertie Clause, MD  ENTRESTO 97-103 MG Take 1 tablet by mouth twice daily Patient taking differently: Take 1 tablet by mouth 2 (two) times daily.  11/15/18  Yes Wellington Hampshire, MD  furosemide (LASIX) 40 MG tablet Take 1 tablet (40 mg total) by mouth daily. Patient taking differently: Take 40 mg by mouth daily. Dr Fletcher Anon 04/17/18  Yes Wellington Hampshire, MD  glipiZIDE (GLUCOTROL) 10 MG tablet Take 10 mg by mouth 2 (two) times daily. Dr Honor Junes 08/13/15  Yes [provider]  Insulin Degludec 200 UNIT/ML SOPN Inject 28 Units into the skin daily. Dr Honor Junes 02/18/18  Yes [provider]  levothyroxine (SYNTHROID, LEVOTHROID) 25 MCG tablet Take 1 tablet (25 mcg total) by mouth daily before breakfast. Patient taking differently: Take 25 mcg by mouth daily before breakfast. Dr Fletcher Anon 06/14/18  Yes Wellington Hampshire, MD  triamcinolone cream (KENALOG) 0.5 % Apply 1 application topically 2 (two) times daily.   Yes [provider]  warfarin (COUMADIN) 5 MG tablet USE AS DIRECTED BY COUMADIN CLINIC FOR 30 DAYS Patient taking differently: Take 5-7.5 mg by mouth daily at 6 PM. Take one 5 mg tablet Sunday, Tuesday, Wednesday, Thursday, and Saturday. And one and one-half tablets 7.5 mg on Monday and Friday 11/25/18  Yes Wellington Hampshire, MD  ferrous sulfate 325 (65 FE) MG EC tablet Take 325 mg by mouth daily with breakfast.    [provider]    Inpatient Medications: Scheduled Meds: . amiodarone  100 mg Oral Daily  . atorvastatin  40 mg Oral Daily  . carvedilol  6.25 mg Oral BID WC  . dextrose  1 ampule Intravenous Once  . [START ON 12/14/2018] ferrous sulfate  325 mg Oral Q breakfast  . [START ON 12/14/2018] furosemide  40 mg Intravenous Q12H  . insulin aspart  0-5 Units Subcutaneous QHS  . insulin aspart  0-9 Units  Subcutaneous TID WC  . [START ON 12/14/2018] levothyroxine  25 mcg Oral QAC breakfast  . potassium chloride  20 mEq Oral Daily  . sodium chloride flush  3 mL Intravenous Q12H  . triamcinolone cream  1 application Topical BID  . warfarin  7.5 mg Oral ONCE-1800  . Warfarin - Pharmacist Dosing Inpatient   Does not apply q1800   Continuous Infusions: . sodium chloride     PRN Meds: sodium chloride, acetaminophen, ondansetron (ZOFRAN) IV, sodium chloride flush  Allergies:   No Known Allergies  Social History:   Social History   Socioeconomic History  . Marital status: Widowed    Spouse name: Not on file  . Number of children: Not on file  . Years of education: Not on file  . Highest education level: Not on file  Occupational History  . Not on file  Social Needs  . Financial resource strain: Not on file  . Food insecurity    Worry: Not on file    Inability: Not on file  . Transportation needs    Medical: Not on file    Non-medical: Not on file  Tobacco Use  .  Smoking status: Former Smoker    Packs/day: 0.50    Years: 5.00    Pack years: 2.50    Types: Cigarettes  . Smokeless tobacco: Never Used  Substance and Sexual Activity  . Alcohol use: No  . Drug use: No  . Sexual activity: Not Currently  Lifestyle  . Physical activity    Days per week: Not on file    Minutes per session: Not on file  . Stress: Not on file  Relationships  . Social Herbalist on phone: Not on file    Gets together: Not on file    Attends religious service: Not on file    Active member of club or organization: Not on file    Attends meetings of clubs or organizations: Not on file    Relationship status: Not on file  . Intimate partner violence    Fear of current or ex partner: Not on file    Emotionally abused: Not on file    Physically abused: Not on file    Forced sexual activity: Not on file  Other Topics Concern  . Not on file  Social History Narrative  . Not on file    Family History:    Family History  Problem Relation Age of Onset  . Heart disease Mother   . Diabetes Mother   . Heart disease Father   . Hypertension Father   . Diabetes Maternal Grandmother      ROS:  Please see the history of present illness.  Review of Systems  Constitutional: Positive for malaise/fatigue.  HENT: Negative.   Respiratory: Negative.   Cardiovascular: Negative.   Gastrointestinal: Negative.   Musculoskeletal: Negative.   Neurological: Negative.   Psychiatric/Behavioral: Negative.   All other systems reviewed and are negative.   Physical Exam/Data:   Vitals:   12/13/18 1230 12/13/18 1430 12/13/18 1605 12/13/18 1636  BP: (!) 133/95 (!) 143/95 (!) 144/93 (!) 163/97  Pulse: 67 64 78 69  Resp: 13 15 14 19   Temp:    (!) 97.5 F (36.4 C)  TempSrc:    Oral  SpO2: 98% 97% 97% 98%    Intake/Output Summary (Last 24 hours) at 12/13/2018 1727 Last data filed at 12/13/2018 1508 Gross per 24 hour  Intake -  Output 1250 ml  Net -1250 ml   Last 3  Weights 09/24/2018 06/27/2018 06/06/2018  Weight (lbs) 269 lb 269 lb 260 lb 8 oz  Weight (kg) 122.018 kg 122.018 kg 118.162 kg      General:  Well nourished, well developed, in no acute distress HEENT: normal Neck: no JVD Vascular: No carotid bruits; radial pulses 2+ bilaterally Cardiac: Irregularly irregular ,no murmur  Lungs: Rhonchi bilaterally Abd: soft, nontender, no hepatomegaly  Ext: no edema Musculoskeletal:  No deformities, BUE and BLE strength normal and equal Skin: warm and dry  Neuro:  No focal abnormalities noted Psych:  Normal affect   EKG:  The EKG was personally reviewed and demonstrates: Atrial fibrillation with rate 73 bpm unable to exclude old anterior MI, T wave abnormality consistent with anterolateral ischemia  Telemetry:  Telemetry was personally reviewed and demonstrates: Atrial fibrillation  Relevant CV Studies:  Echo 12/06/2016 - Left ventricle: The cavity size was moderately dilated. Wall   thickness was increased in a pattern of mild LVH. Systolic   function was severely reduced. The estimated ejection fraction   was in the range of 20% to 25%. Dyskinesis of the anteroseptal   myocardium. Features are consistent with a pseudonormal left   ventricular filling pattern, with concomitant abnormal relaxation   and increased filling pressure (grade 2 diastolic dysfunction). - Aortic valve: Mildly thickened, mildly calcified leaflets.   Sclerosis without stenosis. - Mitral valve: Calcified annulus. There was mild regurgitation. - Left atrium: The atrium was mildly to moderately dilated. - Right ventricle: The cavity size was mildly to moderately   dilated. Systolic function was moderately reduced. - Right atrium: The atrium was mildly dilated. - Tricuspid valve: There was moderate-severe regurgitation. - Pulmonary arteries: Systolic pressure was moderately increased.   Pulmonary artery systolic pressue is 123XX123 mmHg plus central   venous pressure.    Laboratory Data:  High Sensitivity Troponin:   Recent Labs  Lab 12/13/18 1136 12/13/18 1321  TROPONINIHS 12 12     Cardiac EnzymesNo results for input(s): TROPONINI in the last 168 hours. No results for input(s): TROPIPOC in the last 168 hours.  Chemistry Recent Labs  Lab 12/13/18 1030  NA 139  K 3.3*  CL 102  CO2 26  GLUCOSE 106*  BUN 27*  CREATININE 1.80*  CALCIUM 8.2*  GFRNONAA 28*  GFRAA 32*  ANIONGAP 11    No results for input(s): PROT, ALBUMIN, AST, ALT, ALKPHOS, BILITOT in the last 168 hours. Hematology Recent Labs  Lab 12/13/18 1030  WBC 6.1  RBC 4.23  HGB 11.3*  HCT 36.9  MCV 87.2  MCH 26.7  MCHC 30.6  RDW 17.4*  PLT 169   BNP Recent Labs  Lab 12/13/18 1136  BNP 1,269.0*    DDimer No results for input(s): DDIMER in the last 168 hours.   Radiology/Studies:  Dg Chest Portable 1 View  Result Date: 12/13/2018 CLINICAL DATA:  Shortness of breath. EXAM: PORTABLE CHEST 1 VIEW COMPARISON:  Chest x-ray dated July 26, 2016. FINDINGS: The patient is rotated to the right. Stable cardiomegaly. New pulmonary vascular congestion and mild diffuse interstitial opacities. Central peribronchial thickening. No consolidation, pneumothorax, or large pleural effusion. No acute osseous abnormality. IMPRESSION: 1. Mild interstitial pulmonary edema. Electronically Signed   By: Titus Dubin M.D.   On: 12/13/2018 11:36    Assessment and Plan:   Hypoxia/shortness of breath Denies prior smoking history Known pulmonary hypertension seen on prior echocardiogram severely reduced EF 20-25%, BNP elevated.  mild pulmonary edema on  chest x-ray Symptoms possibly exacerbated by recurrence of atrial fibrillation --As outpatient taking Lasix 40 daily -Would continue Lasix 40 IV twice daily --- Needs daily weights -We will need to discuss with her whether she would like to restore normal sinus rhythm  Hypoglycemia Medications need adjusted Was found in the field with glucose  of 40 and confused Again on my rounds late this afternoon glucose levels high 20s -Diabetes medications have been held  CAD with stable angina Prior catheterization December 2018  Persistent Afib On warfarin, rate well controlled February 2020 was normal sinus rhythm, now atrial fibrillation Likely contributing to shortness of breath, fluid retention Timing of onset unclear She is therapeutic on her warfarin, will discuss with her whether she would like to restore normal sinus rhythm --In 2018 in the setting of atrial fibrillation had severely depressed ejection fraction, worsening heart failure   s/p L carotid endarterectomy with previous CVA   Total encounter time more than 110 minutes  Greater than 50% was spent in counseling and coordination of care with the patient    For questions or updates, please contact [place] Please consult [email or web address] for contact info under   Signed, Esmond Plants, MD, Ph.D [facility]

## 2018-12-13 NOTE — H&P (Signed)
[facility] NAME: Meghan [name]    MR#:  [id number]  DATE OF BIRTH:  11-11-1950  DATE OF ADMISSION:  12/13/2018  PRIMARY CARE PHYSICIAN: [name], MD   REQUESTING/REFERRING PHYSICIAN: Marjean Donna  CHIEF COMPLAINT:  Low blood sugar and low oxygen saturation with altered mental status.  HISTORY OF PRESENT ILLNESS:  Meghan [name]  is a 68 y.o. female with a known history of chronic systolic CHF with ejection fraction of 20 to 25%, diabetes mellitus, coronary artery disease, hypertension and persistent atrial fibrillation on anticoagulation with Coumadin who was brought into the emergency room on account of altered mental status.  EMS was called and patient was found to have a blood sugar of 48.  Was given oral glucose and repeat blood sugar was 39.  To 50 cc of D10 was given and repeat blood sugar came up to 162 by EMS.  Patient was also noted to be hypoxic with oxygen saturation of 82% on room air.  Does not use oxygen at home.  With 2 L of oxygen oxygen saturation improved to 98%.  Patient was evaluated in the emergency room.  Found to have elevated B-type natriuretic peptide of 1269.  Troponin negative at 12.  Chest x-ray with findings consistent with CHF.  Patient given a dose of IV Lasix.  Patient awake and alert and oriented at the time of my evaluation with daughter present at bedside.  Had some shortness of breath but no chest pain.  No fevers.  Medical service called to admit patient for further evaluation and management.  PAST MEDICAL HISTORY:   Past Medical History:  Diagnosis Date  . Carotid arterial disease ([place])    a. 02/2012 s/p L CEA.  . Coronary artery disease    a. 02/2012 [facility] MV: Anterior, anteroseptal, septal, apical infarct with mild peri-infarct ischemia, EF 41%- medically managed due to anemia; b. 03/2017 Cath: LM nl, LAD 99ost/p/m - faint L->L collaterals, RI 20ost, LCX 110m, RCA 20p, 87m-->Med Rx for chronic  subtotal LAD dzs. Avoid R Radial access in future.  . 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 Hyperlipidemia   . Hypertension   . Ischemic cardiomyopathy    a. 06/2013 Echo: EF 35-40%, glob HK, mildly dil LA, mild LVH, mild TR, mild to mod MR; b. 07/2016 Echo: EF 25-30%, sev mid-apicalanteroseptal, ant, apical HK, mild MR, sev dil LA, mod dil RA, mod TR, PASP 81mmHg; c. 11/2016 Echo: EF 20-25%, antsept DK, Gr2 DD, mild MR, mod dil LA/RV/RA, mod red RV fxn, mod-sev TR, PASP 40-52mmHg.  . Morbid obesity ([place])   . Normocytic anemia   . Persistent atrial fibrillation    a. CHA2DS2VASc = 7-->coumadin; maintaining sinus on amio.  . Stroke [facility]) 2013    PAST SURGICAL HISTORY:   Past Surgical History:  Procedure Laterality Date  . AMPUTATION TOE Left 09/22/2016   Procedure: AMPUTATION TOE;  Surgeon: Albertine Patricia, DPM;  Location: [facility] ORS;  Service: Podiatry;  Laterality: Left;  . CARDIOVERSION N/A 07/28/2016   Procedure: CARDIOVERSION;  Surgeon: Wellington Hampshire, MD;  Location: [facility] ORS;  Service: Cardiovascular;  Laterality: N/A;  . CARDIOVERSION N/A 08/07/2016   Procedure: CARDIOVERSION;  Surgeon: Wellington Hampshire, MD;  Location: [facility] ORS;  Service: Cardiovascular;  Laterality: N/A;  . CAROTID ENDARTERECTOMY     [facility]; Dr. Lucky Cowboy  . CHOLECYSTECTOMY    . foot infection Right   . LEG  SURGERY     right;infection  . RIGHT/LEFT HEART CATH AND CORONARY ANGIOGRAPHY Bilateral 03/12/2017   Procedure: RIGHT/LEFT HEART CATH AND CORONARY ANGIOGRAPHY;  Surgeon: Wellington Hampshire, MD;  Location: [facility];  Service: Cardiovascular;  Laterality: Bilateral;  . TONSILLECTOMY      SOCIAL HISTORY:   Social History   Tobacco Use  . Smoking status: Former Smoker    Packs/day: 0.50    Years: 5.00    Pack years: 2.50    Types: Cigarettes  . Smokeless tobacco: Never Used  Substance Use Topics  . Alcohol use: No    FAMILY HISTORY:   Family History  Problem Relation Age of  Onset  . Heart disease Mother   . Diabetes Mother   . Heart disease Father   . Hypertension Father   . Diabetes Maternal Grandmother     DRUG ALLERGIES:  No Known Allergies  REVIEW OF SYSTEMS:   Review of Systems  Constitutional: Negative for chills and fever.  HENT: Negative for hearing loss and tinnitus.   Eyes: Negative for blurred vision.  Respiratory: Positive for shortness of breath. Negative for cough and hemoptysis.   Cardiovascular: Negative for chest pain and palpitations.  Gastrointestinal: Negative for heartburn and nausea.  Genitourinary: Negative for dysuria and urgency.  Musculoskeletal: Negative for myalgias and neck pain.  Skin: Negative for itching and rash.  Neurological: Negative for dizziness and headaches.  Psychiatric/Behavioral: Negative for depression and suicidal ideas.    MEDICATIONS AT HOME:   Prior to Admission medications   Medication Sig Start Date End Date Taking? Authorizing Provider  amiodarone (PACERONE) 200 MG tablet Take 1/2 (one-half) tablet by mouth once daily Patient taking differently: Take 100 mg by mouth daily.  11/15/18  Yes Wellington Hampshire, MD  atorvastatin (LIPITOR) 40 MG tablet Take 1 tablet by mouth once daily 11/25/18  Yes Arida, Muhammad A, MD  carvedilol (COREG) 6.25 MG tablet TAKE 1 TABLET BY MOUTH TWICE DAILY WITH A MEAL 09/23/18  Yes Arida, Mertie Clause, MD  ENTRESTO 97-103 MG Take 1 tablet by mouth twice daily Patient taking differently: Take 1 tablet by mouth 2 (two) times daily.  11/15/18  Yes Wellington Hampshire, MD  furosemide (LASIX) 40 MG tablet Take 1 tablet (40 mg total) by mouth daily. Patient taking differently: Take 40 mg by mouth daily. Dr Fletcher Anon 04/17/18  Yes Wellington Hampshire, MD  glipiZIDE (GLUCOTROL) 10 MG tablet Take 10 mg by mouth 2 (two) times daily. Dr Honor Junes 08/13/15  Yes [provider]  Insulin Degludec 200 UNIT/ML SOPN Inject 28 Units into the skin daily. Dr Honor Junes 02/18/18  Yes [provider]  levothyroxine (SYNTHROID, LEVOTHROID) 25 MCG tablet Take 1 tablet (25 mcg total) by mouth daily before breakfast. Patient taking differently: Take 25 mcg by mouth daily before breakfast. Dr Fletcher Anon 06/14/18  Yes Wellington Hampshire, MD  triamcinolone cream (KENALOG) 0.5 % Apply 1 application topically 2 (two) times daily.   Yes [provider]  warfarin (COUMADIN) 5 MG tablet USE AS DIRECTED BY COUMADIN CLINIC FOR 30 DAYS Patient taking differently: Take 5-7.5 mg by mouth daily at 6 PM. Take one 5 mg tablet Sunday, Tuesday, Wednesday, Thursday, and Saturday. And one and one-half tablets 7.5 mg on Monday and Friday 11/25/18  Yes Wellington Hampshire, MD  ferrous sulfate 325 (65 FE) MG EC tablet Take 325 mg by mouth daily with breakfast.    [provider]  VITAL SIGNS:  Blood pressure (!) 133/95, pulse 67, temperature 98.2 F (36.8 C), temperature source Oral, resp. rate 13, SpO2 98 %.  PHYSICAL EXAMINATION:  Physical Exam  GENERAL:  68 y.o.-year-old patient lying in the bed with no acute distress.  EYES: Pupils equal, round, reactive to light and accommodation. No scleral icterus. Extraocular muscles intact.  HEENT: Head atraumatic, normocephalic. Oropharynx and nasopharynx clear.  NECK:  Supple, no jugular venous distention. No thyroid enlargement, no tenderness.  LUNGS: Mild Rales bilaterally.  No wheezing. No use of accessory muscles of respiration.  CARDIOVASCULAR: S1, S2 normal. No murmurs, rubs, or gallops.  ABDOMEN: Soft, nontender, nondistended. Bowel sounds present. No organomegaly or mass.  EXTREMITIES: 2+ pitting edema bilaterally.  No cyanosis , or clubbing.  NEUROLOGIC: Cranial nerves II through XII are intact. Muscle strength 5/5 in all extremities. Sensation intact. Gait not checked.  PSYCHIATRIC: The patient is alert and oriented x 3.  SKIN: No obvious rash, lesion, or ulcer.   LABORATORY PANEL:   CBC Recent Labs  Lab 12/13/18 1030  WBC  6.1  HGB 11.3*  HCT 36.9  PLT 169   ------------------------------------------------------------------------------------------------------------------  Chemistries  Recent Labs  Lab 12/13/18 1030  NA 139  K 3.3*  CL 102  CO2 26  GLUCOSE 106*  BUN 27*  CREATININE 1.80*  CALCIUM 8.2*   ------------------------------------------------------------------------------------------------------------------  Cardiac Enzymes No results for input(s): TROPONINI in the last 168 hours. ------------------------------------------------------------------------------------------------------------------  RADIOLOGY:  Dg Chest Portable 1 View  Result Date: 12/13/2018 CLINICAL DATA:  Shortness of breath. EXAM: PORTABLE CHEST 1 VIEW COMPARISON:  Chest x-ray dated July 26, 2016. FINDINGS: The patient is rotated to the right. Stable cardiomegaly. New pulmonary vascular congestion and mild diffuse interstitial opacities. Central peribronchial thickening. No consolidation, pneumothorax, or large pleural effusion. No acute osseous abnormality. IMPRESSION: 1. Mild interstitial pulmonary edema. Electronically Signed   By: Titus Dubin M.D.   On: 12/13/2018 11:36      IMPRESSION AND PLAN:  Patient is a 68 year old female with history of hypertension, chronic systolic CHF, persistent atrial fibrillation and diabetes mellitus who presented to the emergency room with altered mental status secondary to hypoglycemia and hypoxia from CHF exacerbation.  1.  Acute metabolic encephalopathy Multifactorial secondary to hypoglycemia and hypoxia from CHF. Resolved at this time.  Patient awake and alert and oriented at this time.  2 .  Hypoxia secondary to acute on chronic hypoxic respiratory failure Patient's oxygen saturation was 82% on room air which is improved to the upper 90s with 2 L. Patient not using accessory muscles of respiration.  Comfortable in bed at this time. Being diuresed with IV Lasix. Troponin  negative.  Consulted patient's cardiologist for input. Hold off on Entresto due to acute kidney injury .  Continue Coreg 2D echocardiogram to evaluate cardiac function  3.  Acute kidney injury Hold off on Entresto due to acute kidney injury. Monitor renal function with ongoing diuresis.  Requested for nephrology consult.  4.  Persistent atrial fibrillation Continue rate control with Coreg Patient on anticoagulation with Coumadin with INR 2.1. Pharmacy to assist with dosing Coumadin  5.  Diabetes mellitus type 2. Placed on sliding scale insulin coverage.  Glycosylated hemoglobin level in a.m.  6.  Hypothyroidism Continue Synthroid.  TSH level in a.m.  7.  Hypokalemia; replaced.  Follow-up on repeat levels in a.m.  DVT prophylaxis; patient already on Coumadin  All the records are reviewed and case discussed with ED provider. Management plans discussed with the patient,  family and they are in agreement. Updated patient's daughter present at bedside on treatment plans.  CODE STATUS: Full code  TOTAL TIME TAKING CARE OF THIS PATIENT: 62 minutes.    Demonica Farrey M.D on 12/13/2018 at 2:30 PM  Between 7am to 6pm - Pager - [phone]  After 6pm go to [email or web address] - Proofreader  [employer] [facility] [facility]  Office  [phone]  CC: Primary care physician; [name], MD   Note: This dictation was prepared with Dragon dictation along with smaller phrase technology. Any transcriptional errors that result from this process are unintentional.

## 2018-12-13 NOTE — ED Notes (Signed)
This Rn to bedside. Pt continues to rest in bed, no change in patient condition. [name] remains at bedside. Denies any needs. Will continue to monitor for further patient needs.

## 2018-12-13 NOTE — ED Notes (Signed)
ED TO INPATIENT HANDOFF REPORT  ED Nurse Name and Phone #:  Jinny Blossom [phone]  S Name/Age/Gender Meghan [name] 68 y.o. female Room/Bed: ED07A/ED07A  Code Status   Code Status: Full Code  Home/SNF/Other Home Patient oriented to: self, place, time and situation Is this baseline? Yes   Triage Complete: Triage complete  Chief Complaint hypoglycemia ems  Triage Note Pt from home via EMS with c/o lethargy. Per EMS pt glucose was 48 on arrival, was given oral glucose, recheck was 39, pt was then given 250 of D10 IV and glucose 162, VSS per EMS. Pt drowsy during triage. Pt A&Ox4    Allergies No Known Allergies  Level of Care/Admitting Diagnosis ED Disposition    ED Disposition Condition [facility] Area: [place] [[id number]]  Level of Care: Telemetry [5]  Covid Evaluation: Confirmed COVID Negative  Diagnosis: Acute on chronic systolic [id number]  Admitting Physician: Otila Back [[id number]]  Attending Physician: Otila Back [[id number]]  Estimated length of stay: 3 - 4 days  Certification:: I certify this patient will need inpatient services for at least 2 midnights  PT Class (Do Not Modify): Inpatient [101]  PT Acc Code (Do Not Modify): Private [1]       B Medical/Surgery History Past Medical History:  Diagnosis Date  . Carotid arterial disease ([place])    a. 02/2012 s/p L CEA.  . Coronary artery disease    a. 02/2012 Lexi MV: Anterior, anteroseptal, septal, apical infarct with mild peri-infarct ischemia, EF 41%- medically managed due to anemia; b. 03/2017 Cath: LM nl, LAD 99ost/p/m - faint L->L collaterals, RI 20ost, LCX 54m, RCA 20p, 53m-->Med Rx for chronic subtotal LAD dzs. Avoid R Radial access in future.  . 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 Hyperlipidemia   . Hypertension   . Ischemic cardiomyopathy    a. 06/2013 Echo: EF 35-40%, glob HK, mildly dil LA, mild LVH, mild TR, mild to mod MR;  b. 07/2016 Echo: EF 25-30%, sev mid-apicalanteroseptal, ant, apical HK, mild MR, sev dil LA, mod dil RA, mod TR, PASP 33mmHg; c. 11/2016 Echo: EF 20-25%, antsept DK, Gr2 DD, mild MR, mod dil LA/RV/RA, mod red RV fxn, mod-sev TR, PASP 40-4mmHg.  . Morbid obesity ([place])   . Normocytic anemia   . Persistent atrial fibrillation    a. CHA2DS2VASc = 7-->coumadin; maintaining sinus on amio.  . Stroke [facility]) 2013   Past Surgical History:  Procedure Laterality Date  . AMPUTATION TOE Left 09/22/2016   Procedure: AMPUTATION TOE;  Surgeon: Albertine Patricia, DPM;  Location: [facility] ORS;  Service: Podiatry;  Laterality: Left;  . CARDIOVERSION N/A 07/28/2016   Procedure: CARDIOVERSION;  Surgeon: Wellington Hampshire, MD;  Location: [facility] ORS;  Service: Cardiovascular;  Laterality: N/A;  . CARDIOVERSION N/A 08/07/2016   Procedure: CARDIOVERSION;  Surgeon: Wellington Hampshire, MD;  Location: [facility] ORS;  Service: Cardiovascular;  Laterality: N/A;  . CAROTID ENDARTERECTOMY     [facility]; Dr. Lucky Cowboy  . CHOLECYSTECTOMY    . foot infection Right   . LEG SURGERY     right;infection  . RIGHT/LEFT HEART CATH AND CORONARY ANGIOGRAPHY Bilateral 03/12/2017   Procedure: RIGHT/LEFT HEART CATH AND CORONARY ANGIOGRAPHY;  Surgeon: Wellington Hampshire, MD;  Location: [place] CV LAB;  Service: Cardiovascular;  Laterality: Bilateral;  . TONSILLECTOMY       A IV Location/Drains/Wounds Patient Lines/Drains/Airways Status   Active Line/Drains/Airways    Name:  Placement date:   Placement time:   Site:   Days:   Peripheral IV 12/13/18 Left Antecubital   12/13/18    1030    Antecubital   less than 1   Airway   09/22/16    1332     812   Incision (Closed) 09/22/16 Foot Left   09/22/16    1235     812   Wound / Incision (Open or Dehisced) 07/26/16 Diabetic ulcer Foot Right;Left   07/26/16    1620    Foot   870   Wound / Incision (Open or Dehisced) 09/21/16 Diabetic ulcer Foot Left red swollen abrasion to top, dry. weeping   09/21/16    2107     Foot   813   Wound / Incision (Open or Dehisced) 09/21/16 Diabetic ulcer Foot Right eschar 1x1 cm   09/21/16    2108    Foot   813   Wound / Incision (Open or Dehisced) 03/30/17 Diabetic ulcer Toe (Comment  which one) Left   03/30/17    2010    Toe (Comment  which one)   623          Intake/Output Last 24 hours  Intake/Output Summary (Last 24 hours) at 12/13/2018 1520 Last data filed at 12/13/2018 1508 Gross per 24 hour  Intake -  Output 1250 ml  Net -1250 ml    Labs/Imaging Results for orders placed or performed during the hospital encounter of 12/13/18 (from the past 48 hour(s))  Glucose, capillary     Status: None   Collection Time: 12/13/18 10:24 AM  Result Value Ref Range   Glucose-Capillary 93 70 - 99 mg/dL  Basic metabolic panel     Status: Abnormal   Collection Time: 12/13/18 10:30 AM  Result Value Ref Range   Sodium 139 135 - 145 mmol/L   Potassium 3.3 (L) 3.5 - 5.1 mmol/L   Chloride 102 98 - 111 mmol/L   CO2 26 22 - 32 mmol/L   Glucose, Bld 106 (H) 70 - 99 mg/dL   BUN 27 (H) 8 - 23 mg/dL   Creatinine, Ser 1.80 (H) 0.44 - 1.00 mg/dL   Calcium 8.2 (L) 8.9 - 10.3 mg/dL   GFR calc non Af Amer 28 (L) >60 mL/min   GFR calc Af Amer 32 (L) >60 mL/min   Anion gap 11 5 - 15    Comment: Performed at [facility], [address] [place], [place] [address]  CBC     Status: Abnormal   Collection Time: 12/13/18 10:30 AM  Result Value Ref Range   WBC 6.1 4.0 - 10.5 K/uL   RBC 4.23 3.87 - 5.11 MIL/uL   Hemoglobin 11.3 (L) 12.0 - 15.0 g/dL   HCT 36.9 36.0 - 46.0 %   MCV 87.2 80.0 - 100.0 fL   MCH 26.7 26.0 - 34.0 pg   MCHC 30.6 30.0 - 36.0 g/dL   RDW 17.4 (H) 11.5 - 15.5 %   Platelets 169 150 - 400 K/uL   nRBC 0.0 0.0 - 0.2 %    Comment: Performed at [facility], [address] [place], [place] [address]  Glucose, capillary     Status: None   Collection Time: 12/13/18 10:35 AM  Result Value Ref Range   Glucose-Capillary 98 70 - 99 mg/dL  Troponin I  (High Sensitivity)     Status: None   Collection Time: 12/13/18 11:36 AM  Result Value Ref Range  Troponin I (High Sensitivity) 12 <18 ng/L    Comment: (NOTE) Elevated high sensitivity troponin I (hsTnI) values and significant  changes across serial measurements may suggest ACS but many other  chronic and acute conditions are known to elevate hsTnI results.  Refer to the "Links" section for chest pain algorithms and additional  guidance. Performed at [facility], [address] [place], [place] [address]   Brain natriuretic peptide     Status: Abnormal   Collection Time: 12/13/18 11:36 AM  Result Value Ref Range   B Natriuretic Peptide 1,269.0 (H) 0.0 - 100.0 pg/mL    Comment: Performed at [facility], [address] [place], [place] [address]  Protime-INR     Status: Abnormal   Collection Time: 12/13/18 11:36 AM  Result Value Ref Range   Prothrombin Time 23.0 (H) 11.4 - 15.2 seconds   INR 2.1 (H) 0.8 - 1.2    Comment: (NOTE) INR goal varies based on device and disease states. Performed at [facility], [address] [place], [place] [address]   APTT     Status: Abnormal   Collection Time: 12/13/18 11:36 AM  Result Value Ref Range   aPTT 47 (H) 24 - 36 seconds    Comment:        IF BASELINE aPTT IS ELEVATED, SUGGEST PATIENT RISK ASSESSMENT BE USED TO DETERMINE APPROPRIATE ANTICOAGULANT THERAPY. Performed at [facility], [address] [place], [place] [address]   SARS Coronavirus 2 [facility] order, Performed in [facility] hospital lab) Nasopharyngeal Nasopharyngeal Swab     Status: None   Collection Time: 12/13/18 11:38 AM   Specimen: Nasopharyngeal Swab  Result Value Ref Range   SARS Coronavirus 2 NEGATIVE NEGATIVE    Comment: (NOTE) If result is NEGATIVE SARS-CoV-2 target nucleic acids are NOT DETECTED. The SARS-CoV-2 RNA is generally detectable in upper and lower  respiratory specimens during the acute phase of  infection. The lowest  concentration of SARS-CoV-2 viral copies this assay can detect is 250  copies / mL. A negative result does not preclude SARS-CoV-2 infection  and should not be used as the sole basis for treatment or other  patient management decisions.  A negative result may occur with  improper specimen collection / handling, submission of specimen other  than nasopharyngeal swab, presence of viral mutation(s) within the  areas targeted by this assay, and inadequate number of viral copies  (<250 copies / mL). A negative result must be combined with clinical  observations, patient history, and epidemiological information. If result is POSITIVE SARS-CoV-2 target nucleic acids are DETECTED. The SARS-CoV-2 RNA is generally detectable in upper and lower  respiratory specimens dur ing the acute phase of infection.  Positive  results are indicative of active infection with SARS-CoV-2.  Clinical  correlation with patient history and other diagnostic information is  necessary to determine patient infection status.  Positive results do  not rule out bacterial infection or co-infection with other viruses. If result is PRESUMPTIVE POSTIVE SARS-CoV-2 nucleic acids MAY BE PRESENT.   A presumptive positive result was obtained on the submitted specimen  and confirmed on repeat testing.  While 2019 novel coronavirus  (SARS-CoV-2) nucleic acids may be present in the submitted sample  additional confirmatory testing may be necessary for epidemiological  and / or clinical management purposes  to differentiate between  SARS-CoV-2 and other Sarbecovirus currently known to infect humans.  If clinically indicated additional testing with an alternate test  methodology [id number])  is advised. The SARS-CoV-2 RNA is generally  detectable in upper and lower respiratory sp ecimens during the acute  phase of infection. The expected result is Negative. Fact Sheet for Patients:   StrictlyIdeas.no Fact Sheet for Healthcare Providers: BankingDealers.co.za This test is not yet approved or cleared by the [place] FDA and has been authorized for detection and/or diagnosis of SARS-CoV-2 by FDA under an Emergency Use Authorization (EUA).  This EUA will remain in effect (meaning this test can be used) for the duration of the COVID-19 declaration under Section 564(b)(1) of the Act, 21 U.S.C. section 360bbb-3(b)(1), unless the authorization is terminated or revoked sooner. Performed at [facility], [address] [place], [place] [address]   Glucose, capillary     Status: None   Collection Time: 12/13/18 12:27 PM  Result Value Ref Range   Glucose-Capillary 75 70 - 99 mg/dL  Troponin I (High Sensitivity)     Status: None   Collection Time: 12/13/18  1:21 PM  Result Value Ref Range   Troponin I (High Sensitivity) 12 <18 ng/L    Comment: (NOTE) Elevated high sensitivity troponin I (hsTnI) values and significant  changes across serial measurements may suggest ACS but many other  chronic and acute conditions are known to elevate hsTnI results.  Refer to the "Links" section for chest pain algorithms and additional  guidance. Performed at [facility], [address] [place], [place] [address]   Urinalysis, Complete w Microscopic     Status: Abnormal   Collection Time: 12/13/18  2:15 PM  Result Value Ref Range   Color, Urine YELLOW (A) YELLOW   APPearance HAZY (A) CLEAR   Specific Gravity, Urine 1.009 1.005 - 1.030   pH 5.0 5.0 - 8.0   Glucose, UA NEGATIVE NEGATIVE mg/dL   Hgb urine dipstick MODERATE (A) NEGATIVE   Bilirubin Urine NEGATIVE NEGATIVE   Ketones, ur NEGATIVE NEGATIVE mg/dL   Protein, ur 30 (A) NEGATIVE mg/dL   Nitrite NEGATIVE NEGATIVE   Leukocytes,Ua MODERATE (A) NEGATIVE   RBC / HPF 0-5 0 - 5 RBC/hpf   WBC, UA 21-50 0 - 5 WBC/hpf   Bacteria, UA MANY (A) NONE SEEN   Squamous  Epithelial / LPF 0-5 0 - 5   Mucus PRESENT     Comment: Performed at [facility], [address], [place], [place] [address]   Dg Chest Portable 1 View  Result Date: 12/13/2018 CLINICAL DATA:  Shortness of breath. EXAM: PORTABLE CHEST 1 VIEW COMPARISON:  Chest x-ray dated July 26, 2016. FINDINGS: The patient is rotated to the right. Stable cardiomegaly. New pulmonary vascular congestion and mild diffuse interstitial opacities. Central peribronchial thickening. No consolidation, pneumothorax, or large pleural effusion. No acute osseous abnormality. IMPRESSION: 1. Mild interstitial pulmonary edema. Electronically Signed   By: Titus Dubin M.D.   On: 12/13/2018 11:36    Pending Labs Unresulted Labs (From admission, onward)    Start     Ordered   12/14/18 [id number]  Basic metabolic panel  Daily,   STAT     12/13/18 1415   12/14/18 0500  CBC  Tomorrow morning,   STAT     12/13/18 1421   12/14/18 0500  Magnesium  Tomorrow morning,   STAT     12/13/18 1421   12/14/18 0500  Phosphorus  Tomorrow morning,   STAT     12/13/18 1421   12/14/18 0500  Hemoglobin A1c  Tomorrow morning,   STAT    Comments: To  assess prior glycemic control    12/13/18 1422   12/14/18 0500  TSH  Tomorrow morning,   STAT     12/13/18 1429   12/14/18 0500  Protime-INR  Tomorrow morning,   STAT     12/13/18 1520   12/13/18 1410  HIV antibody (Routine Testing)  Once,   STAT     12/13/18 1415          Vitals/Pain Today's Vitals   12/13/18 1200 12/13/18 1203 12/13/18 1215 12/13/18 1230  BP: (!) 143/96   (!) 133/95  Pulse: 60  62 67  Resp: 14  14 13   Temp:      TempSrc:      SpO2: 99%  99% 98%  PainSc:  0-No pain      Isolation Precautions No active isolations  Medications Medications  amiodarone (PACERONE) tablet 100 mg (has no administration in time range)  atorvastatin (LIPITOR) tablet 40 mg (has no administration in time range)  carvedilol (COREG) tablet 6.25 mg (has no administration in time  range)  glipiZIDE (GLUCOTROL) tablet 10 mg (has no administration in time range)  Insulin Degludec SOPN 28 Units (has no administration in time range)  levothyroxine (SYNTHROID) tablet 25 mcg (has no administration in time range)  ferrous sulfate EC tablet 325 mg (has no administration in time range)  triamcinolone cream (KENALOG) 0.5 % 1 application (has no administration in time range)  sodium chloride flush (NS) 0.9 % injection 3 mL (has no administration in time range)  sodium chloride flush (NS) 0.9 % injection 3 mL (has no administration in time range)  0.9 %  sodium chloride infusion (has no administration in time range)  acetaminophen (TYLENOL) tablet 650 mg (has no administration in time range)  ondansetron (ZOFRAN) injection 4 mg (has no administration in time range)  furosemide (LASIX) injection 40 mg (has no administration in time range)  insulin aspart (novoLOG) injection 0-9 Units (has no administration in time range)  insulin aspart (novoLOG) injection 0-5 Units (has no administration in time range)  potassium chloride SA (K-DUR) CR tablet 20 mEq (has no administration in time range)  sodium chloride flush (NS) 0.9 % injection 3 mL (3 mLs Intravenous Given by Other 12/13/18 1050)  furosemide (LASIX) injection 40 mg (40 mg Intravenous Given 12/13/18 1246)    Mobility UTA, pt has not walked Low fall risk   Focused Assessments See focused assessments   R Recommendations: See Admitting Provider Note  Report given to:   Additional Notes:  Pt with hx of psoriasis, with significant rash to buttocks, lower legs, and to genital area.

## 2018-12-13 NOTE — Consult Note (Signed)
ANTICOAGULATION CONSULT NOTE - Initial Consult  Pharmacy Consult for Warfarin Dosing and Monitoring  Indication: atrial fibrillation  No Known Allergies   Vital Signs: Temp: 98.2 F (36.8 C) (09/04 1031) Temp Source: Oral (09/04 1031) BP: 133/95 (09/04 1230) Pulse Rate: 67 (09/04 1230)  Labs: Recent Labs    12/11/18 0922 12/13/18 1030 12/13/18 1136 12/13/18 1321  HGB  --  11.3*  --   --   HCT  --  36.9  --   --   PLT  --  169  --   --   APTT  --   --  47*  --   LABPROT  --   --  23.0*  --   INR 4.8*  --  2.1*  --   CREATININE  --  1.80*  --   --   TROPONINIHS  --   --  12 12    CrCl cannot be calculated (Unknown ideal weight.).   Medical History: Past Medical History:  Diagnosis Date  . Carotid arterial disease ([place])    a. 02/2012 s/p L CEA.  . Coronary artery disease    a. 02/2012 Lexi MV: Anterior, anteroseptal, septal, apical infarct with mild peri-infarct ischemia, EF 41%- medically managed due to anemia; b. 03/2017 Cath: LM nl, LAD 99ost/p/m - faint L->L collaterals, RI 20ost, LCX 74m, RCA 20p, 64m-->Med Rx for chronic subtotal LAD dzs. Avoid R Radial access in future.  . 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 Hyperlipidemia   . Hypertension   . Ischemic cardiomyopathy    a. 06/2013 Echo: EF 35-40%, glob HK, mildly dil LA, mild LVH, mild TR, mild to mod MR; b. 07/2016 Echo: EF 25-30%, sev mid-apicalanteroseptal, ant, apical HK, mild MR, sev dil LA, mod dil RA, mod TR, PASP 66mmHg; c. 11/2016 Echo: EF 20-25%, antsept DK, Gr2 DD, mild MR, mod dil LA/RV/RA, mod red RV fxn, mod-sev TR, PASP 40-51mmHg.  . Morbid obesity ([place])   . Normocytic anemia   . Persistent atrial fibrillation    a. CHA2DS2VASc = 7-->coumadin; maintaining sinus on amio.  . Stroke [facility]) 2013    Assessment: Pharmacy consulted for warfarin dosing and monitoring in 68 yo female with PMH of A. Fib. Patient was taking warfarin PTA. Last dose reported on 9/3 AM. INR therapeutic  on admission.   Home Regimen: Warfarin 5mg : Sun, Tues, Wed, Thurs, Fri                              Warfarin 7.5mg :  Mon, Fri   DATE INR DOSE 9/4 2.1  Goal of Therapy:  INR 2-3 Monitor platelets by anticoagulation protocol: Yes   Plan:  9/4 INR therapeutic @ 2.1. Will order patient's home dose of 7.5mg  x 1 dose tonight.   Pharmacy will F/U with INR in AM and continue to order warfarin based on levels.   Plan for CBC at least every 3 days per protocol.   Pernell Dupre, PharmD, BCPS Clinical Pharmacist 12/13/2018 3:19 PM

## 2018-12-13 NOTE — ED Notes (Signed)
EDP at bedside to update patient and family.

## 2018-12-14 ENCOUNTER — Encounter: Payer: Self-pay | Admitting: Internal Medicine

## 2018-12-14 ENCOUNTER — Inpatient Hospital Stay (HOSPITAL_COMMUNITY)
Admit: 2018-12-14 | Discharge: 2018-12-14 | Disposition: A | Discharge status: Home / Self Care | Payer: Medicare Other | Attending: Internal Medicine | Admitting: Internal Medicine

## 2018-12-14 DIAGNOSIS — N179 Acute kidney failure, unspecified: Secondary | ICD-10-CM

## 2018-12-14 DIAGNOSIS — I420 Dilated cardiomyopathy: Secondary | ICD-10-CM

## 2018-12-14 DIAGNOSIS — R0600 Dyspnea, unspecified: Secondary | ICD-10-CM

## 2018-12-14 DIAGNOSIS — I340 Nonrheumatic mitral (valve) insufficiency: Secondary | ICD-10-CM

## 2018-12-14 DIAGNOSIS — I361 Nonrheumatic tricuspid (valve) insufficiency: Secondary | ICD-10-CM

## 2018-12-14 LAB — TSH: TSH: 3.975 u[IU]/mL (ref 0.350–4.500)

## 2018-12-14 LAB — GLUCOSE, CAPILLARY
Glucose-Capillary: 111 mg/dL — ABNORMAL HIGH (ref 70–99)
Glucose-Capillary: 125 mg/dL — ABNORMAL HIGH (ref 70–99)
Glucose-Capillary: 218 mg/dL — ABNORMAL HIGH (ref 70–99)
Glucose-Capillary: 269 mg/dL — ABNORMAL HIGH (ref 70–99)

## 2018-12-14 LAB — HEMOGLOBIN A1C
Hgb A1c MFr Bld: 7.4 % — ABNORMAL HIGH (ref 4.8–5.6)
Mean Plasma Glucose: 165.68 mg/dL

## 2018-12-14 LAB — BASIC METABOLIC PANEL
Anion gap: 9 (ref 5–15)
BUN: 29 mg/dL — ABNORMAL HIGH (ref 8–23)
CO2: 29 mmol/L (ref 22–32)
Calcium: 8.5 mg/dL — ABNORMAL LOW (ref 8.9–10.3)
Chloride: 104 mmol/L (ref 98–111)
Creatinine, Ser: 1.85 mg/dL — ABNORMAL HIGH (ref 0.44–1.00)
GFR calc Af Amer: 31 mL/min — ABNORMAL LOW (ref >60)
GFR calc non Af Amer: 27 mL/min — ABNORMAL LOW (ref >60)
Glucose, Bld: 102 mg/dL — ABNORMAL HIGH (ref 70–99)
Potassium: 4.4 mmol/L (ref 3.5–5.1)
Sodium: 142 mmol/L (ref 135–145)

## 2018-12-14 LAB — CBC
HCT: 36.3 % (ref 36.0–46.0)
Hemoglobin: 10.9 g/dL — ABNORMAL LOW (ref 12.0–15.0)
MCH: 26 pg (ref 26.0–34.0)
MCHC: 30 g/dL (ref 30.0–36.0)
MCV: 86.6 fL (ref 80.0–100.0)
Platelets: 187 10*3/uL (ref 150–400)
RBC: 4.19 MIL/uL (ref 3.87–5.11)
RDW: 17.2 % — ABNORMAL HIGH (ref 11.5–15.5)
WBC: 5.7 10*3/uL (ref 4.0–10.5)
nRBC: 0 % (ref 0.0–0.2)

## 2018-12-14 LAB — MAGNESIUM: Magnesium: 2.1 mg/dL (ref 1.7–2.4)

## 2018-12-14 LAB — PROTIME-INR
INR: 1.9 — ABNORMAL HIGH (ref 0.8–1.2)
Prothrombin Time: 21.8 seconds — ABNORMAL HIGH (ref 11.4–15.2)

## 2018-12-14 LAB — ECHOCARDIOGRAM COMPLETE: Weight: 4715.2 oz

## 2018-12-14 LAB — PHOSPHORUS: Phosphorus: 3.9 mg/dL (ref 2.5–4.6)

## 2018-12-14 MED ORDER — ENOXAPARIN SODIUM 150 MG/ML ~~LOC~~ SOLN
130.0000 mg | Freq: Once | SUBCUTANEOUS | Status: AC
  Administered 2018-12-14: 130 mg via SUBCUTANEOUS
  Filled 2018-12-14: qty 0.87

## 2018-12-14 MED ORDER — PERFLUTREN LIPID MICROSPHERE
1.0000 mL | INTRAVENOUS | Status: AC | PRN
  Administered 2018-12-14: 13:00:00 3 mL via INTRAVENOUS
  Filled 2018-12-14: qty 10

## 2018-12-14 MED ORDER — WARFARIN SODIUM 5 MG PO TABS
5.0000 mg | ORAL_TABLET | Freq: Once | ORAL | Status: AC
  Administered 2018-12-14: 5 mg via ORAL
  Filled 2018-12-14: qty 1

## 2018-12-14 MED ORDER — AMIODARONE HCL 200 MG PO TABS
400.0000 mg | ORAL_TABLET | Freq: Two times a day (BID) | ORAL | Status: DC
  Administered 2018-12-14 – 2018-12-15 (×2): 400 mg via ORAL
  Filled 2018-12-14 (×2): qty 2

## 2018-12-14 NOTE — TOC Initial Note (Signed)
Transition of Care [facility]) - Initial/Assessment Note    Patient Details  Name: Meghan [name] MRN: [id number] Date of Birth: 09/28/1950  Transition of Care [facility]) CM/SW Contact:    Latanya Maudlin, RN Phone Number: 12/14/2018, 9:55 AM  Clinical Narrative:  TOC consulted to assist with disposition. Patient lives with her daughter and uses a wheelchair and rolling walker at baseline. Patient has used home health for PT in the past but feels she does not need it at this time. Also, mentioned home health nursing for blood sugar management and HF. Patient adamantly refuses home health and states she feels confident managing her conditions. She has scales and checks weights. She is unsure if she follows at HF clinic specifically but tells me :she already has enough Doctors" and she prefers just to see her Cardiologist. PCP is Otilio Miu. Obtains medications without issues.                Expected Discharge Plan: Home/Self Care Barriers to Discharge: Continued Medical Work up   Patient Goals and CMS Choice Patient states their goals for this hospitalization and ongoing recovery are:: to go home CMS Medicare.gov Compare Post Acute Care list provided to:: Patient Choice offered to / list presented to : Patient  Expected Discharge Plan and Services Expected Discharge Plan: Home/Self Care   Discharge Planning Services: CM Consult Post Acute Care Choice: [place] arrangements for the past 2 months: Single Family Home Expected Discharge Date: 12/15/18                                    Prior Living Arrangements/Services Living arrangements for the past 2 months: Single Family Home Lives with:: Adult Children Patient language and need for interpreter reviewed:: Yes Do you feel safe going back to the place where you live?: Yes      Need for Family Participation in Patient Care: No (Comment)   Current home services: DME Criminal Activity/Legal Involvement Pertinent to Current  Situation/Hospitalization: No - Comment as needed  Activities of Daily Living Home Assistive Devices/Equipment: Gilford Rile (specify type) ADL Screening (condition at time of admission) Patient's cognitive ability adequate to safely complete daily activities?: Yes Is the patient deaf or have difficulty hearing?: No Does the patient have difficulty seeing, even when wearing glasses/contacts?: No Does the patient have difficulty concentrating, remembering, or making decisions?: No Patient able to express need for assistance with ADLs?: Yes Does the patient have difficulty dressing or bathing?: No Independently performs ADLs?: Yes (appropriate for developmental age) Does the patient have difficulty walking or climbing stairs?: Yes Weakness of Legs: Both Weakness of Arms/Hands: None  Permission Sought/Granted Permission sought to share information with : Case Manager                Emotional Assessment Appearance:: Appears stated age Attitude/Demeanor/Rapport: Gracious Affect (typically observed): Withdrawn Orientation: : Oriented to Place, Oriented to Self, Oriented to  Time, Oriented to Situation      Admission diagnosis:  Hypoglycemia [E16.2] Acute respiratory failure with hypoxia ([place]) [J96.01] AKI (acute kidney injury) ([place]) [N17.9] Patient Active Problem List   Diagnosis Date Noted  . Acute on chronic systolic CHF (congestive heart failure) ([place]) 12/13/2018  . Sepsis ([place]) 03/30/2017  . Elevated TSH 12/29/2016  . Psoriasis 12/26/2016  . Obesity, Class III, BMI 40-49.9 (morbid obesity) ([place]) 12/26/2016  . Toe osteomyelitis, left ([place]) 09/21/2016  . CKD (chronic kidney disease),  stage III ([place]) 07/26/2016  . History of CVA (cerebrovascular accident) without residual deficits 09/03/2013  . Hyperlipidemia 09/03/2013  . Type 2 diabetes mellitus with peripheral neuropathy ([place]) 09/03/2013  . Hyperlipidemia due to type 2 diabetes mellitus ([place]) 09/03/2013  . Paroxysmal atrial  fibrillation ([place]) 07/16/2013  . Ischemic cardiomyopathy 07/16/2013  . Hypertension 07/16/2013  . Chronic systolic heart failure ([place]) 04/12/2012  . Coronary atherosclerosis   . Dyspnea 03/01/2012   PCP:  [name], MD Pharmacy:   [facility] [address] [place] [address] Phone: [phone] Fax: [phone]     Social Determinants of Health (SDOH) Interventions    Readmission Risk Interventions Readmission Risk Prevention Plan 12/14/2018  Transportation Screening Complete  PCP or Specialist Appt within 5-7 Days Complete  Home Care Screening Complete  Medication Review (RN CM) Complete  Some recent data might be hidden

## 2018-12-14 NOTE — Consult Note (Signed)
ANTICOAGULATION CONSULT NOTE - Initial Consult  Pharmacy Consult for Warfarin Dosing and Monitoring  Indication: atrial fibrillation  No Known Allergies   Vital Signs: Temp: 98.4 F (36.9 C) (09/05 0330) Temp Source: Oral (09/05 0330) BP: 110/83 (09/05 0330) Pulse Rate: 71 (09/05 0330)  Labs: Recent Labs    12/11/18 0922 12/13/18 1030 12/13/18 1136 12/13/18 1321 12/14/18 0529  HGB  --  11.3*  --   --  10.9*  HCT  --  36.9  --   --  36.3  PLT  --  169  --   --  187  APTT  --   --  47*  --   --   LABPROT  --   --  23.0*  --  21.8*  INR 4.8*  --  2.1*  --  1.9*  CREATININE  --  1.80*  --   --  1.85*  TROPONINIHS  --   --  12 12  --     Estimated Creatinine Clearance: 40.4 mL/min (A) (by C-G formula based on SCr of 1.85 mg/dL (H)).   Medical History: Past Medical History:  Diagnosis Date  . Carotid arterial disease ([place])    a. 02/2012 s/p L CEA.  . CKD stage G3b/A1, GFR 30-44 and albumin creatinine ratio <30 mg/g (HCC)   . Coronary artery disease    a. 02/2012 [facility] MV: Anterior, anteroseptal, septal, apical infarct with mild peri-infarct ischemia, EF 41%- medically managed due to anemia; b. 03/2017 Cath: LM nl, LAD 99ost/p/m - faint L->L collaterals, RI 20ost, LCX 89m, RCA 20p, 70m-->Med Rx for chronic subtotal LAD dzs. Avoid R Radial access in future.  . 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 Hyperlipidemia   . Hypertension   . Ischemic cardiomyopathy    a. 06/2013 Echo: EF 35-40%, glob HK, mildly dil LA, mild LVH, mild TR, mild to mod MR; b. 07/2016 Echo: EF 25-30%, sev mid-apicalanteroseptal, ant, apical HK, mild MR, sev dil LA, mod dil RA, mod TR, PASP 77mmHg; c. 11/2016 Echo: EF 20-25%, antsept DK, Gr2 DD, mild MR, mod dil LA/RV/RA, mod red RV fxn, mod-sev TR, PASP 40-73mmHg.  . Morbid obesity ([place])   . Normocytic anemia   . Persistent atrial fibrillation    a. CHA2DS2VASc = 7-->coumadin; maintaining sinus on amio.  . Stroke [facility]) 2013     Assessment: Pharmacy consulted for warfarin dosing and monitoring in 68 yo female with PMH of A. Fib. Patient was taking warfarin PTA. Last dose reported on 9/3 AM. INR therapeutic on admission.   Home Regimen: Warfarin 5mg : Sun, Tues, Wed, Thurs, Sat                              Warfarin 7.5mg :  Mon, Fri   DATE INR DOSE 9/4 2.1        7.5 mg 9/5       1.9         5 mg   Goal of Therapy:  INR 2-3 Monitor platelets by anticoagulation protocol: Yes   Plan:  INR slightly subtherapeutic. Will order patient's home dose of 5mg  x 1 dose tonight.   Pharmacy will F/U with INR in AM and continue to order warfarin based on levels.   Plan for CBC at least every 3 days per protocol. CBC stable.   Oswald Hillock, PharmD, BCPS Clinical Pharmacist 12/14/2018 7:54 AM

## 2018-12-14 NOTE — Progress Notes (Addendum)
Progress Note  Patient Name: Meghan [name] Date of Encounter: 12/14/2018  Primary Cardiologist: Kathlyn Sacramento, MD   Subjective   No complaints, reports her breathing is slowly improving Does not feel at her baseline, still shortness of breath at rest Leg edema is slowly improving still not back to baseline Long discussion with her concerning atrial fibrillation Reports that she is asymptomatic with in terms of palpitations and tachycardia.  Does not know when she converted back from normal sinus rhythm to atrial fibrillation   Inpatient Medications    Scheduled Meds: . amiodarone  100 mg Oral Daily  . atorvastatin  40 mg Oral Daily  . carvedilol  6.25 mg Oral BID WC  . ferrous sulfate  325 mg Oral Q breakfast  . furosemide  40 mg Intravenous Q12H  . insulin aspart  0-5 Units Subcutaneous QHS  . insulin aspart  0-9 Units Subcutaneous TID WC  . levothyroxine  25 mcg Oral QAC breakfast  . potassium chloride  20 mEq Oral Daily  . sodium chloride flush  3 mL Intravenous Q12H  . triamcinolone cream  1 application Topical BID  . warfarin  5 mg Oral ONCE-1800  . Warfarin - Pharmacist Dosing Inpatient   Does not apply q1800   Continuous Infusions: . sodium chloride     PRN Meds: sodium chloride, acetaminophen, ondansetron (ZOFRAN) IV, sodium chloride flush   Vital Signs    Vitals:   12/13/18 1933 12/13/18 2242 12/14/18 0330 12/14/18 0833  BP: 119/82  110/83 127/81  Pulse: 73 93 71 75  Resp: 20  20 19   Temp: (!) 97.5 F (36.4 C)  98.4 F (36.9 C)   TempSrc: Oral  Oral   SpO2: 98% 92% 92% 90%  Weight:   133.7 kg     Intake/Output Summary (Last 24 hours) at 12/14/2018 1545 Last data filed at 12/14/2018 1126 Gross per 24 hour  Intake 340 ml  Output 200 ml  Net 140 ml   Last 3 Weights 12/14/2018 09/24/2018 06/27/2018  Weight (lbs) 294 lb 11.2 oz 269 lb 269 lb  Weight (kg) 133.675 kg 122.018 kg 122.018 kg      Telemetry    Atrial fibrillation rate 90- Personally Reviewed   ECG     - Personally Reviewed  Physical Exam   GEN: No acute distress.  Obese Neck: No JVD Cardiac:  Irregularly irregular, , no murmurs, rubs, or gallops.  Respiratory: Clear to auscultation bilaterally. GI: Soft, nontender, non-distended  MS: No edema; No deformity. Neuro:  Nonfocal  Psych: Normal affect   Labs    High Sensitivity Troponin:   Recent Labs  Lab 12/13/18 1136 12/13/18 1321  TROPONINIHS 12 12      Chemistry Recent Labs  Lab 12/13/18 1030 12/14/18 0529  NA 139 142  K 3.3* 4.4  CL 102 104  CO2 26 29  GLUCOSE 106* 102*  BUN 27* 29*  CREATININE 1.80* 1.85*  CALCIUM 8.2* 8.5*  GFRNONAA 28* 27*  GFRAA 32* 31*  ANIONGAP 11 9     Hematology Recent Labs  Lab 12/13/18 1030 12/14/18 0529  WBC 6.1 5.7  RBC 4.23 4.19  HGB 11.3* 10.9*  HCT 36.9 36.3  MCV 87.2 86.6  MCH 26.7 26.0  MCHC 30.6 30.0  RDW 17.4* 17.2*  PLT 169 187    BNP Recent Labs  Lab 12/13/18 1136  BNP 1,269.0*     DDimer No results for input(s): DDIMER in the last 168 hours.   Radiology  Dg Chest Portable 1 View  Result Date: 12/13/2018 CLINICAL DATA:  Shortness of breath. EXAM: PORTABLE CHEST 1 VIEW COMPARISON:  Chest x-ray dated July 26, 2016. FINDINGS: The patient is rotated to the right. Stable cardiomegaly. New pulmonary vascular congestion and mild diffuse interstitial opacities. Central peribronchial thickening. No consolidation, pneumothorax, or large pleural effusion. No acute osseous abnormality. IMPRESSION: 1. Mild interstitial pulmonary edema. Electronically Signed   By: Titus Dubin M.D.   On: 12/13/2018 11:36    Cardiac Studies   Echocardiogram personally reviewed by myself Severely depressed ejection fraction 25 to 30%, at least moderately elevated right heart pressures Mildly reduced RV function Left atrium severely dilated  Patient Profile     Meghan [name] is a 68 y.o. female with a hx of chronic systolic heart failure, coronary artery disease,  persistent atrial fibrillation on warfarin maintained in sinus rhythm with amiodarone, hypertension, hld, DM2, anemia, hyperlipidemia, and carotid artery disease s/p left carotid endarterectomy with previous CVA who is being seen today for the evaluation of acute on chronic systolic heart failure, presenting to the hospital with hypoglycemia  Assessment & Plan    Acute on chronic respiratory distress Acute on chronic systolic CHF , pulmonary hypertension Ejection fraction likely worse in the setting of recurrence of her atrial fibrillation Would continue Lasix IV twice daily given moderate to severely elevated right heart pressures  Diabetes type 2 on insulin Hypoglycemic in the field and in the hospital Will defer medication adjustment to medicine service She denies  Coronary artery disease stable angina Prior heart catheterization 2018 Denies any anginal symptoms  Persistent Afib On warfarin, rate well controlled February 2020 was normal sinus rhythm, now atrial fibrillation --Severely reduced ejection fraction and atrial fibrillation 2018 and again this admission --Long discussion with patient and daughter at the bedside, we have recommended trying to restore normal sinus rhythm. -We will increase amiodarone up to 400 twice daily oral dosing She would likely need several more days of diuresis -Could arrange for cardioversion prior to discharge or at a later date in 1 to 2 weeks --- INR subtherapeutic today In effort to avoid a transesophageal echo, will give Lovenox for INR 1.9 Yesterday INR 2.1.  We will follow closely tomorrow, if INR continues to run low may need to re-dose Lovenox tomorrow morning   s/p L carotid endarterectomy with previous CVA  Long discussion with patient concerning her various treatment options for her atrial fibrillation, discussed with nursing  Total encounter time more than 35 minutes  Greater than 50% was spent in counseling and coordination of care  with the patient   For questions or updates, please contact [place] Please consult [email or web address] for contact info under        Signed, Ida Rogue, MD  12/14/2018, 3:45 PM

## 2018-12-14 NOTE — Progress Notes (Signed)
[facility] NAME: Meghan [name]    MR#:  [id number]  DATE OF BIRTH:  1950-04-11  SUBJECTIVE:   SOB improved   REVIEW OF SYSTEMS:    Review of Systems  Constitutional: Negative for fever, chills weight loss HENT: Negative for ear pain, nosebleeds, congestion, facial swelling, rhinorrhea, neck pain, neck stiffness and ear discharge.   Respiratory: Negative for cough, ++shortness of breath, NO wheezing  Cardiovascular: Negative for chest pain, palpitations and+  leg swelling.  Gastrointestinal: Negative for heartburn, abdominal pain, vomiting, diarrhea or consitpation Genitourinary: Negative for dysuria, urgency, frequency, hematuria Musculoskeletal: Negative for back pain or joint pain Neurological: Negative for dizziness, seizures, syncope, focal weakness,  numbness and headaches.  Hematological: Does not bruise/bleed easily.  Psychiatric/Behavioral: Negative for hallucinations, confusion, dysphoric mood    Tolerating Diet: yes      DRUG ALLERGIES:  No Known Allergies  VITALS:  Blood pressure 127/81, pulse 75, temperature 98.4 F (36.9 C), temperature source Oral, resp. rate 19, weight 133.7 kg, SpO2 90 %.  PHYSICAL EXAMINATION:  Constitutional: Appears obese. No distress. HENT: Normocephalic. Marland Kitchen Oropharynx is clear and moist.  Eyes: Conjunctivae and EOM are normal. PERRLA, no scleral icterus.  Neck: Normal ROM. Neck supple. No JVD. No tracheal deviation. CVS: RRR, S1/S2 +, no murmurs, no gallops, no carotid bruit.  Pulmonary: Effort and breath sounds normal, no stridor, rhonchi, wheezes, rales.  Abdominal: Soft. BS +,  no distension, tenderness, rebound or guarding.  Musculoskeletal: Normal range of motion. 1+ LEE and no tenderness.  Neuro: Alert. CN 2-12 grossly intact. No focal deficits. Skin: Skin is warm and dry. No rash noted. Psychiatric: Normal mood and affect.      LABORATORY PANEL:   CBC Recent Labs   Lab 12/14/18 0529  WBC 5.7  HGB 10.9*  HCT 36.3  PLT 187   ------------------------------------------------------------------------------------------------------------------  Chemistries  Recent Labs  Lab 12/14/18 0529  NA 142  K 4.4  CL 104  CO2 29  GLUCOSE 102*  BUN 29*  CREATININE 1.85*  CALCIUM 8.5*  MG 2.1   ------------------------------------------------------------------------------------------------------------------  Cardiac Enzymes No results for input(s): TROPONINI in the last 168 hours. ------------------------------------------------------------------------------------------------------------------  RADIOLOGY:  Dg Chest Portable 1 View  Result Date: 12/13/2018 CLINICAL DATA:  Shortness of breath. EXAM: PORTABLE CHEST 1 VIEW COMPARISON:  Chest x-ray dated July 26, 2016. FINDINGS: The patient is rotated to the right. Stable cardiomegaly. New pulmonary vascular congestion and mild diffuse interstitial opacities. Central peribronchial thickening. No consolidation, pneumothorax, or large pleural effusion. No acute osseous abnormality. IMPRESSION: 1. Mild interstitial pulmonary edema. Electronically Signed   By: Titus Dubin M.D.   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   1.  Acute on chronic hypoxic respiratory failure due to recurrence of atrial fibrillation, CHF exacerbation Wean to baseline as tolerated COVID testing negative.   2.  Hyperglycemia with acute metabolic encephalopathy from hypoglycemia: Patient is at her baseline.  Continue current management for diabetes. A1c 7.4 fairly controlled.  3.  Persistent atrial fibrillation:Contine Amiodarone and Coreg for heart rate control Continue Coumadin as per pharmacy  dosing We will cardioversion outpatient 3 to 4 weeks. Follow up on [facility] Cardiology consult appreciated  4.  Acute kidney  injury stage III: Holding nephrotoxic medications including ENTRESTO. Nephrology consultation for further evaluation.   5.  Hypothyroidism: Continue Synthroid  6.  Acute on chronic combined systolic and diastolic heart failure ejection fraction 20 to 25%: Continue Lasix 40 mg IV every 12 hours as per cardiology. Follow intake and output with daily weight Follow-up on echocardiogram CHF clinic upon discharge     Management plans discussed with the patient and she is in agreement.  CODE STATUS: full  TOTAL TIME TAKING CARE OF THIS PATIENT: 30 minutes.     POSSIBLE D/C 2 days, DEPENDING ON CLINICAL CONDITION.   Bettey Costa M.D on 12/14/2018 at 10:33 AM  Between 7am to 6pm - Pager - [phone] After 6pm go to [email or web address] - password [email or web address] [facility]  Office  [phone]  CC: Primary care physician; [name], MD  Note: This dictation was prepared with Dragon dictation along with smaller phrase technology. Any transcriptional errors that result from this process are unintentional.

## 2018-12-14 NOTE — Evaluation (Signed)
Physical Therapy Evaluation Patient Details Name: Meghan [name] MRN: [id number] DOB: 1950-06-30 Today's Date: 12/14/2018   History of Present Illness  presented to ER secondary to dizziness, weakness and fall in home environment, noted with hypoglycemia, hypoxia and AMS; admitted for management of acute/chronic respiratory failure related to CHF exacerbation, acute metabolic encephalopathy.  Clinical Impression  Upon evaluation, patient alert and oriented; follows commands and demonstrates good effort with session.  Bilat UE/LE strength and ROM grossly symmetrical; WFL for basic transfers and mobility.  Demonstrates ability to complete bed mobility with mod indep; sit/stand and SPT (bed/recliner) without assist device, cga/min assist. Poor standing balance with limited overall activity tolerance noted.  Does require bilat UE support on armrest of chair throughout transfer and prefers (to maintain) "long way around" during transfer for optimal UE use/support.  Gait deferred, as patient non-ambulatory at baseline. Of note, patient on RA upon arrival to session.  Resting sats noted 84-85%.  Reapplied 2L O2 via [place] for O2 >92% at rest and with exertion.  Will continue to monitor, but may require O2 upon discharge. Would benefit from skilled PT to address above deficits and promote optimal return to PLOF; Recommend transition to [place] upon discharge from acute hospitalization.      Follow Up Recommendations Home health PT    Equipment Recommendations       Recommendations for Other Services       Precautions / Restrictions Precautions Precautions: Fall Restrictions Weight Bearing Restrictions: No      Mobility  Bed Mobility Overal bed mobility: Modified Independent                Transfers Overall transfer level: Needs assistance   Transfers: Stand Pivot Transfers   Stand pivot transfers: Min guard       General transfer comment: requires bilat UE support on armrests  throughout; prefers "long way around" during transfer to maximize UE support (does this at baseline)  Ambulation/Gait             General Gait Details: non-ambulatory at baseline (since L foot surgery/toe amp years prior)  Stairs            Wheelchair Mobility    Modified Rankin (Stroke Patients Only)       Balance Overall balance assessment: Needs assistance Sitting-balance support: No upper extremity supported;Feet supported Sitting balance-Leahy Scale: Good     Standing balance support: Bilateral upper extremity supported Standing balance-Leahy Scale: Poor                               Pertinent Vitals/Pain Pain Assessment: No/denies pain    Home Living Family/patient expects to be discharged to:: Private residence Living Arrangements: Children Available Help at Discharge: Family;Available 24 hours/day Type of Home: House Home Access: Ramped entrance     Home Layout: One level Home Equipment: Walker - 2 wheels;Wheelchair - manual      Prior Function Level of Independence: Independent with assistive device(s)         Comments: WC level (manual) as primary mobility, performing SPT to/from (reaching for armrest of chair for external support).  Daughter assists with ADLs, household chores as needed.  No home O2.     Hand Dominance        Extremity/Trunk Assessment   Upper Extremity Assessment Upper Extremity Assessment: Overall WFL for tasks assessed    Lower Extremity Assessment Lower Extremity Assessment: Overall WFL for tasks assessed(grossly at least  4/5 throughout; generally edematous)       Communication   Communication: No difficulties  Cognition Arousal/Alertness: Awake/alert Behavior During Therapy: WFL for tasks assessed/performed Overall Cognitive Status: Within Functional Limits for tasks assessed                                        General Comments      Exercises Other Exercises Other  Exercises: Educated in pursed lip breathing for O2 support. Other Exercises: Discussed transfer technique-patient prefers to continue "long way around" with SPT, as this is baseline and most comfortable for her.   Assessment/Plan    PT Assessment Patient needs continued PT services  PT Problem List Decreased balance;Decreased activity tolerance;Decreased mobility;Cardiopulmonary status limiting activity;Obesity       PT Treatment Interventions DME instruction;Therapeutic activities;Functional mobility training;Therapeutic exercise;Balance training;Patient/family education    PT Goals (Current goals can be found in the Care Plan section)  Acute Rehab PT Goals Patient Stated Goal: to return home with daughter PT Goal Formulation: With patient/family Time For Goal Achievement: 12/28/18 Potential to Achieve Goals: Good Additional Goals Additional Goal #1: Indep understanding and use of energy conservation strategies, pursed lip breathing/O2 management.    Frequency Min 2X/week   Barriers to discharge        Co-evaluation               AM-PAC PT "6 Clicks" Mobility  Outcome Measure Help needed turning from your back to your side while in a flat bed without using bedrails?: None Help needed moving from lying on your back to sitting on the side of a flat bed without using bedrails?: None Help needed moving to and from a bed to a chair (including a wheelchair)?: A Little Help needed standing up from a chair using your arms (e.g., wheelchair or bedside chair)?: A Little Help needed to walk in hospital room?: Total Help needed climbing 3-5 steps with a railing? : Total 6 Click Score: 16    End of Session Equipment Utilized During Treatment: Gait belt;Oxygen Activity Tolerance: Patient tolerated treatment well Patient left: in chair;with call bell/phone within reach;with chair alarm set;with family/visitor present Nurse Communication: Mobility status PT Visit Diagnosis: Muscle  weakness (generalized) (M62.81);Difficulty in walking, not elsewhere classified (R26.2)    Time: [id number] PT Time Calculation (min) (ACUTE ONLY): 24 min   Charges:   PT Evaluation $PT Eval Moderate Complexity: 1 Mod PT Treatments $Therapeutic Activity: 8-22 mins        Lori-Ann Lindfors H. Owens Shark, PT, DPT, NCS 12/14/18, 4:30 PM [phone]

## 2018-12-14 NOTE — Progress Notes (Signed)
*  PRELIMINARY RESULTS* Echocardiogram 2D Echocardiogram has been performed. Definity IV Contrast used on this study.  Meghan [name] [name] 12/14/2018, 12:47 PM

## 2018-12-15 DIAGNOSIS — I420 Dilated cardiomyopathy: Secondary | ICD-10-CM

## 2018-12-15 DIAGNOSIS — E11649 Type 2 diabetes mellitus with hypoglycemia without coma: Secondary | ICD-10-CM

## 2018-12-15 LAB — PROTIME-INR
INR: 2.2 — ABNORMAL HIGH (ref 0.8–1.2)
Prothrombin Time: 24.2 seconds — ABNORMAL HIGH (ref 11.4–15.2)

## 2018-12-15 LAB — BASIC METABOLIC PANEL
Anion gap: 10 (ref 5–15)
BUN: 31 mg/dL — ABNORMAL HIGH (ref 8–23)
CO2: 28 mmol/L (ref 22–32)
Calcium: 8.2 mg/dL — ABNORMAL LOW (ref 8.9–10.3)
Chloride: 100 mmol/L (ref 98–111)
Creatinine, Ser: 1.7 mg/dL — ABNORMAL HIGH (ref 0.44–1.00)
GFR calc Af Amer: 35 mL/min — ABNORMAL LOW (ref >60)
GFR calc non Af Amer: 30 mL/min — ABNORMAL LOW (ref >60)
Glucose, Bld: 125 mg/dL — ABNORMAL HIGH (ref 70–99)
Potassium: 3.7 mmol/L (ref 3.5–5.1)
Sodium: 138 mmol/L (ref 135–145)

## 2018-12-15 LAB — GLUCOSE, CAPILLARY
Glucose-Capillary: 107 mg/dL — ABNORMAL HIGH (ref 70–99)
Glucose-Capillary: 161 mg/dL — ABNORMAL HIGH (ref 70–99)

## 2018-12-15 MED ORDER — AMIODARONE HCL 400 MG PO TABS: 400.0000 mg | ORAL_TABLET | Freq: Two times a day (BID) | ORAL | 0 refills | Status: DC

## 2018-12-15 MED ORDER — WARFARIN SODIUM 5 MG PO TABS
5.0000 mg | ORAL_TABLET | Freq: Once | ORAL | Status: DC
  Filled 2018-12-15: qty 1

## 2018-12-15 MED ORDER — FUROSEMIDE 40 MG PO TABS: 40.0000 mg | ORAL_TABLET | Freq: Two times a day (BID) | ORAL | 0 refills | Status: DC

## 2018-12-15 MED ORDER — LOSARTAN POTASSIUM 25 MG PO TABS: 25.0000 mg | ORAL_TABLET | Freq: Every day | ORAL | 11 refills | Status: DC

## 2018-12-15 NOTE — Progress Notes (Signed)
Progress Note  Patient Name: Meghan [name] Date of Encounter: 12/15/2018  Primary Cardiologist: Kathlyn Sacramento, MD   Subjective   Feels that her breathing is close to baseline Long discussion with daughter at the bedside, patient does not do much walking at home Used to be on oxygen, does not have this set up at home Patient does not feel like she needs oxygen   Inpatient Medications    Scheduled Meds: . amiodarone  400 mg Oral BID  . atorvastatin  40 mg Oral Daily  . carvedilol  6.25 mg Oral BID WC  . ferrous sulfate  325 mg Oral Q breakfast  . furosemide  40 mg Intravenous Q12H  . insulin aspart  0-5 Units Subcutaneous QHS  . insulin aspart  0-9 Units Subcutaneous TID WC  . levothyroxine  25 mcg Oral QAC breakfast  . potassium chloride  20 mEq Oral Daily  . sodium chloride flush  3 mL Intravenous Q12H  . triamcinolone cream  1 application Topical BID  . warfarin  5 mg Oral ONCE-1800  . Warfarin - Pharmacist Dosing Inpatient   Does not apply q1800   Continuous Infusions: . sodium chloride     PRN Meds: sodium chloride, acetaminophen, ondansetron (ZOFRAN) IV, sodium chloride flush   Vital Signs    Vitals:   12/15/18 0818 12/15/18 1505 12/15/18 1506 12/15/18 1543  BP: 110/68   114/78  Pulse: 65 95  76  Resp: 16   20  Temp: 97.8 F (36.6 C)   98.4 F (36.9 C)  TempSrc: Oral   Oral  SpO2: 90% 90%  95%  Weight:   133.8 kg   Height:        Intake/Output Summary (Last 24 hours) at 12/15/2018 1544 Last data filed at 12/15/2018 0948 Gross per 24 hour  Intake 240 ml  Output 1250 ml  Net -1010 ml   Last 3 Weights 12/15/2018 12/15/2018 12/14/2018  Weight (lbs) 294 lb 14.4 oz 290 lb 3.8 oz 294 lb 11.2 oz  Weight (kg) 133.766 kg 131.65 kg 133.675 kg      Telemetry    Atrial fibrillation rate 90- Personally Reviewed  ECG     - Personally Reviewed  Physical Exam   Constitutional:  oriented to person, place, and time. No distress.  HENT:  Head: Normocephalic and  atraumatic.  Eyes:  no discharge. No scleral icterus.  Neck: Normal range of motion. Neck supple. No JVD present.  Cardiovascular: Irregularly irregular normal heart sounds and intact distal pulses. Exam reveals no gallop and no friction rub. No edema No murmur heard. Pulmonary/Chest: Clear, scattered wheezes Abdominal: Soft.  no distension.  no tenderness.  Musculoskeletal: Normal range of motion.  no  tenderness or deformity.  Neurological:  normal muscle tone. Coordination normal. No atrophy Skin: Skin is warm and dry. No rash noted. not diaphoretic.  Psychiatric:  normal mood and affect. behavior is normal. Thought content normal.      Labs    High Sensitivity Troponin:   Recent Labs  Lab 12/13/18 1136 12/13/18 1321  TROPONINIHS 12 12      Chemistry Recent Labs  Lab 12/13/18 1030 12/14/18 0529 12/15/18 0559  NA 139 142 138  K 3.3* 4.4 3.7  CL 102 104 100  CO2 26 29 28   GLUCOSE 106* 102* 125*  BUN 27* 29* 31*  CREATININE 1.80* 1.85* 1.70*  CALCIUM 8.2* 8.5* 8.2*  GFRNONAA 28* 27* 30*  GFRAA 32* 31* 35*  ANIONGAP 11 9 10  Hematology Recent Labs  Lab 12/13/18 1030 12/14/18 0529  WBC 6.1 5.7  RBC 4.23 4.19  HGB 11.3* 10.9*  HCT 36.9 36.3  MCV 87.2 86.6  MCH 26.7 26.0  MCHC 30.6 30.0  RDW 17.4* 17.2*  PLT 169 187    BNP Recent Labs  Lab 12/13/18 1136  BNP 1,269.0*     DDimer No results for input(s): DDIMER in the last 168 hours.   Radiology    No results found.  Cardiac Studies   Echocardiogram personally reviewed by myself Severely depressed ejection fraction 25 to 30%, at least moderately elevated right heart pressures Mildly reduced RV function Left atrium severely dilated  Patient Profile     Tirsa Sabir is a 68 y.o. female with a hx of chronic systolic heart failure, coronary artery disease, persistent atrial fibrillation on warfarin maintained in sinus rhythm with amiodarone, hypertension, hld, DM2, anemia, hyperlipidemia, and  carotid artery disease s/p left carotid endarterectomy with previous CVA who is being seen today for the evaluation of acute on chronic systolic heart failure, presenting to the hospital with hypoglycemia  Assessment & Plan    Acute on chronic respiratory distress Acute on chronic systolic CHF , pulmonary hypertension by history As seen in 2018, ejection fraction has dropped in the setting of atrial fibrillation recurrence -Likely euvolemic today, Would discharge on Lasix 40 twice daily, previously was taking 40 daily  Diabetes type 2 on insulin Hypoglycemic in the field and in the hospital Will defer medication adjustment to medicine service  Coronary artery disease stable angina Prior heart catheterization 2018 Denies any anginal symptoms No further ischemic work-up needed at this time Would restore normal sinus rhythm, reevaluate ejection fraction with limited echocardiogram If ejection fraction remains low may need ischemic work-up  Persistent Afib On warfarin, rate well controlled February 2020 was normal sinus rhythm, now atrial fibrillation --Severely reduced ejection fraction in atrial fibrillation 2018 and again this admission -Discussed yesterday, would continue amiodarone 400 twice daily for 5 days then down to 200 twice daily Outpatient cardioversion in 2 weeks if she remains in atrial fibrillation INR therapeutic Was given Lovenox therapeutic dose bridge yesterday for INR 1.9 -She is in agreement to restore normal sinus rhythm   s/p L carotid endarterectomy with previous CVA Goal LDL less than 70 On warfarin  Discussed discharge planning potentially later today Discussed with nursing, needs ambulatory saturations, needs to work with PT Ideally needs home PT given her debility  Total encounter time more than 35 minutes  Greater than 50% was spent in counseling and coordination of care with the patient    For questions or updates, please contact [facility]  Please consult [email or web address] for contact info under        Signed, Ida Rogue, MD  12/15/2018, 3:44 PM

## 2018-12-15 NOTE — Plan of Care (Signed)
  Problem: Education: Goal: Knowledge of General Education information will improve Description: Including pain rating scale, medication(s)/side effects and non-pharmacologic comfort measures Outcome: Progressing   Problem: Health Behavior/Discharge Planning: Goal: Ability to manage health-related needs will improve Outcome: Progressing   Problem: Clinical Measurements: Goal: Ability to maintain clinical measurements within normal limits will improve Outcome: Progressing Goal: Will remain free from infection Outcome: Progressing Goal: Diagnostic test results will improve Outcome: Progressing Goal: Respiratory complications will improve Outcome: Progressing Goal: Cardiovascular complication will be avoided Outcome: Progressing   Problem: Activity: Goal: Risk for activity intolerance will decrease Outcome: Progressing   Problem: Nutrition: Goal: Adequate nutrition will be maintained Outcome: Progressing   Problem: Coping: Goal: Level of anxiety will decrease Outcome: Progressing   Problem: Elimination: Goal: Will not experience complications related to bowel motility Outcome: Progressing Goal: Will not experience complications related to urinary retention Outcome: Progressing   Problem: Pain Managment: Goal: General experience of comfort will improve Outcome: Progressing   Problem: Safety: Goal: Ability to remain free from injury will improve Outcome: Progressing   Problem: Skin Integrity: Goal: Risk for impaired skin integrity will decrease Outcome: Progressing   Problem: Education: Goal: Ability to describe self-care measures that may prevent or decrease complications (Diabetes Survival Skills Education) will improve Outcome: Progressing Goal: Individualized Educational Video(s) Outcome: Progressing   Problem: Coping: Goal: Ability to adjust to condition or change in health will improve Outcome: Progressing   Problem: Fluid Volume: Goal: Ability to  maintain a balanced intake and output will improve Outcome: Progressing   Problem: Health Behavior/Discharge Planning: Goal: Ability to identify and utilize available resources and services will improve Outcome: Progressing Goal: Ability to manage health-related needs will improve Outcome: Progressing   Problem: Metabolic: Goal: Ability to maintain appropriate glucose levels will improve Outcome: Progressing   Problem: Nutritional: Goal: Maintenance of adequate nutrition will improve Outcome: Progressing Goal: Progress toward achieving an optimal weight will improve Outcome: Progressing   Problem: Skin Integrity: Goal: Risk for impaired skin integrity will decrease Outcome: Progressing   Problem: Tissue Perfusion: Goal: Adequacy of tissue perfusion will improve Outcome: Progressing   Problem: Education: Goal: Ability to demonstrate management of disease process will improve Outcome: Progressing Goal: Ability to verbalize understanding of medication therapies will improve Outcome: Progressing Goal: Individualized Educational Video(s) Outcome: Progressing   Problem: Activity: Goal: Capacity to carry out activities will improve Outcome: Progressing   Problem: Cardiac: Goal: Ability to achieve and maintain adequate cardiopulmonary perfusion will improve Outcome: Progressing   

## 2018-12-15 NOTE — Discharge Instructions (Signed)
Heart Failure, Diagnosis ° °Heart failure means that your heart is not able to pump blood in the right way. This makes it hard for your body to work well. Heart failure is usually a long-term (chronic) condition. You must take good care of yourself and follow your treatment plan from your doctor. °What are the causes? °This condition may be caused by: °· High blood pressure. °· Build up of cholesterol and fat in the arteries. °· Heart attack. This injures the heart muscle. °· Heart valves that do not open and close properly. °· Damage of the heart muscle. This is also called cardiomyopathy. °· Lung disease. °· Abnormal heart rhythms. °What increases the risk? °The risk of heart failure goes up as a person ages. This condition is also more likely to develop in people who: °· Are overweight. °· Are female. °· Smoke or chew tobacco. °· Abuse alcohol or illegal drugs. °· Have taken medicines that can damage the heart. °· Have diabetes. °· Have abnormal heart rhythms. °· Have thyroid problems. °· Have low blood counts (anemia). °What are the signs or symptoms? °Symptoms of this condition include: °· Shortness of breath. °· Coughing. °· Swelling of the feet, ankles, legs, or belly. °· Losing weight for no reason. °· Trouble breathing. °· Waking from sleep because of the need to sit up and get more air. °· Rapid heartbeat. °· Being very tired. °· Feeling dizzy, or feeling like you may pass out (faint). °· Having no desire to eat. °· Feeling like you may vomit (nauseous). °· Peeing (urinating) more at night. °· Feeling confused. °How is this treated? ° °  ° °This condition may be treated with: °· Medicines. These can be given to treat blood pressure and to make the heart muscles stronger. °· Changes in your daily life. These may include eating a healthy diet, staying at a healthy body weight, quitting tobacco and illegal drug use, or doing exercises. °· Surgery. Surgery can be done to open blocked valves, or to put devices in  the heart, such as pacemakers. °· A donor heart (heart transplant). You will receive a healthy heart from a donor. °Follow these instructions at home: °· Treat other conditions as told by your doctor. These may include high blood pressure, diabetes, thyroid disease, or abnormal heart rhythms. °· Learn as much as you can about heart failure. °· Get support as you need it. °· Keep all follow-up visits as told by your doctor. This is important. °Summary °· Heart failure means that your heart is not able to pump blood in the right way. °· This condition is caused by high blood pressure, heart attack, or damage of the heart muscle. °· Symptoms of this condition include shortness of breath and swelling of the feet, ankles, legs, or belly. You may also feel very tired or feel like you may vomit. °· You may be treated with medicines, surgery, or changes in your daily life. °· Treat other health conditions as told by your doctor. °This information is not intended to replace advice given to you by your health care provider. Make sure you discuss any questions you have with your health care provider. °Document Released: 01/04/2008 Document Revised: 06/14/2018 Document Reviewed: 06/14/2018 °Elsevier Patient Education © 2020 [employer] ° °

## 2018-12-15 NOTE — Plan of Care (Signed)
Problem: Education: Goal: Knowledge of General Education information will improve Description: Including pain rating scale, medication(s)/side effects and non-pharmacologic comfort measures 12/15/2018 1515 by Aubery Lapping, RN Outcome: Completed/Met 12/15/2018 1154 by Aubery Lapping, RN Outcome: Progressing   Problem: Health Behavior/Discharge Planning: Goal: Ability to manage health-related needs will improve 12/15/2018 1515 by Aubery Lapping, RN Outcome: Completed/Met 12/15/2018 1154 by Aubery Lapping, RN Outcome: Progressing   Problem: Clinical Measurements: Goal: Ability to maintain clinical measurements within normal limits will improve 12/15/2018 1515 by Aubery Lapping, RN Outcome: Completed/Met 12/15/2018 1154 by Aubery Lapping, RN Outcome: Progressing Goal: Will remain free from infection 12/15/2018 1515 by Aubery Lapping, RN Outcome: Completed/Met 12/15/2018 1154 by Aubery Lapping, RN Outcome: Progressing Goal: Diagnostic test results will improve 12/15/2018 1515 by Aubery Lapping, RN Outcome: Completed/Met 12/15/2018 1154 by Aubery Lapping, RN Outcome: Progressing Goal: Respiratory complications will improve 12/15/2018 1515 by Aubery Lapping, RN Outcome: Completed/Met 12/15/2018 1154 by Aubery Lapping, RN Outcome: Progressing Goal: Cardiovascular complication will be avoided 12/15/2018 1515 by Aubery Lapping, RN Outcome: Completed/Met 12/15/2018 1154 by Aubery Lapping, RN Outcome: Progressing   Problem: Activity: Goal: Risk for activity intolerance will decrease 12/15/2018 1515 by Aubery Lapping, RN Outcome: Completed/Met 12/15/2018 1154 by Aubery Lapping, RN Outcome: Progressing   Problem: Nutrition: Goal: Adequate nutrition will be maintained 12/15/2018 1515 by Aubery Lapping, RN Outcome: Completed/Met 12/15/2018 1154 by Aubery Lapping, RN Outcome: Progressing   Problem: Coping: Goal: Level  of anxiety will decrease 12/15/2018 1515 by Aubery Lapping, RN Outcome: Completed/Met 12/15/2018 1154 by Aubery Lapping, RN Outcome: Progressing   Problem: Elimination: Goal: Will not experience complications related to bowel motility 12/15/2018 1515 by Aubery Lapping, RN Outcome: Completed/Met 12/15/2018 1154 by Aubery Lapping, RN Outcome: Progressing Goal: Will not experience complications related to urinary retention 12/15/2018 1515 by Aubery Lapping, RN Outcome: Completed/Met 12/15/2018 1154 by Aubery Lapping, RN Outcome: Progressing   Problem: Pain Managment: Goal: General experience of comfort will improve 12/15/2018 1515 by Aubery Lapping, RN Outcome: Completed/Met 12/15/2018 1154 by Aubery Lapping, RN Outcome: Progressing   Problem: Safety: Goal: Ability to remain free from injury will improve 12/15/2018 1515 by Aubery Lapping, RN Outcome: Completed/Met 12/15/2018 1154 by Aubery Lapping, RN Outcome: Progressing   Problem: Skin Integrity: Goal: Risk for impaired skin integrity will decrease 12/15/2018 1515 by Aubery Lapping, RN Outcome: Completed/Met 12/15/2018 1154 by Aubery Lapping, RN Outcome: Progressing   Problem: Education: Goal: Ability to describe self-care measures that may prevent or decrease complications (Diabetes Survival Skills Education) will improve 12/15/2018 1515 by Aubery Lapping, RN Outcome: Completed/Met 12/15/2018 1154 by Aubery Lapping, RN Outcome: Progressing Goal: Individualized Educational Video(s) 12/15/2018 1515 by Aubery Lapping, RN Outcome: Completed/Met 12/15/2018 1154 by Aubery Lapping, RN Outcome: Progressing   Problem: Coping: Goal: Ability to adjust to condition or change in health will improve 12/15/2018 1515 by Aubery Lapping, RN Outcome: Completed/Met 12/15/2018 1154 by Aubery Lapping, RN Outcome: Progressing   Problem: Fluid Volume: Goal: Ability to maintain a  balanced intake and output will improve 12/15/2018 1515 by Aubery Lapping, RN Outcome: Completed/Met 12/15/2018 1154 by Aubery Lapping, RN Outcome: Progressing   Problem: Health Behavior/Discharge Planning: Goal: Ability to identify and utilize available resources and services will improve 12/15/2018 1515 by Aubery Lapping, RN Outcome: Completed/Met 12/15/2018 1154 by Aubery Lapping,  RN Outcome: Progressing Goal: Ability to manage health-related needs will improve 12/15/2018 1515 by Aubery Lapping, RN Outcome: Completed/Met 12/15/2018 1154 by Aubery Lapping, RN Outcome: Progressing   Problem: Metabolic: Goal: Ability to maintain appropriate glucose levels will improve 12/15/2018 1515 by Aubery Lapping, RN Outcome: Completed/Met 12/15/2018 1154 by Aubery Lapping, RN Outcome: Progressing   Problem: Nutritional: Goal: Maintenance of adequate nutrition will improve 12/15/2018 1515 by Aubery Lapping, RN Outcome: Completed/Met 12/15/2018 1154 by Aubery Lapping, RN Outcome: Progressing Goal: Progress toward achieving an optimal weight will improve 12/15/2018 1515 by Aubery Lapping, RN Outcome: Completed/Met 12/15/2018 1154 by Aubery Lapping, RN Outcome: Progressing   Problem: Skin Integrity: Goal: Risk for impaired skin integrity will decrease 12/15/2018 1515 by Aubery Lapping, RN Outcome: Completed/Met 12/15/2018 1154 by Aubery Lapping, RN Outcome: Progressing   Problem: Tissue Perfusion: Goal: Adequacy of tissue perfusion will improve 12/15/2018 1515 by Aubery Lapping, RN Outcome: Completed/Met 12/15/2018 1154 by Aubery Lapping, RN Outcome: Progressing   Problem: Education: Goal: Ability to demonstrate management of disease process will improve 12/15/2018 1515 by Aubery Lapping, RN Outcome: Completed/Met 12/15/2018 1154 by Aubery Lapping, RN Outcome: Progressing Goal: Ability to verbalize understanding of  medication therapies will improve 12/15/2018 1515 by Aubery Lapping, RN Outcome: Completed/Met 12/15/2018 1154 by Aubery Lapping, RN Outcome: Progressing Goal: Individualized Educational Video(s) 12/15/2018 1515 by Aubery Lapping, RN Outcome: Completed/Met 12/15/2018 1154 by Aubery Lapping, RN Outcome: Progressing   Problem: Activity: Goal: Capacity to carry out activities will improve 12/15/2018 1515 by Aubery Lapping, RN Outcome: Completed/Met 12/15/2018 1154 by Aubery Lapping, RN Outcome: Progressing   Problem: Cardiac: Goal: Ability to achieve and maintain adequate cardiopulmonary perfusion will improve 12/15/2018 1515 by Aubery Lapping, RN Outcome: Completed/Met 12/15/2018 1154 by Aubery Lapping, RN Outcome: Progressing   Problem: Acute Rehab PT Goals(only PT should resolve) Goal: Pt Will Transfer Bed To Chair/Chair To Bed Outcome: Completed/Met Goal: PT Additional Goal #1 Outcome: Completed/Met

## 2018-12-15 NOTE — Consult Note (Signed)
ANTICOAGULATION CONSULT NOTE - Initial Consult  Pharmacy Consult for Warfarin Dosing and Monitoring  Indication: atrial fibrillation  No Known Allergies   Vital Signs: Temp: 97.8 F (36.6 C) (09/06 0818) Temp Source: Oral (09/06 0818) BP: 110/68 (09/06 0818) Pulse Rate: 65 (09/06 0818)  Labs: Recent Labs    12/13/18 1030 12/13/18 1136 12/13/18 1321 12/14/18 0529 12/15/18 0559 12/15/18 1035  HGB 11.3*  --   --  10.9*  --   --   HCT 36.9  --   --  36.3  --   --   PLT 169  --   --  187  --   --   APTT  --  47*  --   --   --   --   LABPROT  --  23.0*  --  21.8*  --  24.2*  INR  --  2.1*  --  1.9*  --  2.2*  CREATININE 1.80*  --   --  1.85* 1.70*  --   TROPONINIHS  --  12 12  --   --   --     Estimated Creatinine Clearance: 40.2 mL/min (A) (by C-G formula based on SCr of 1.7 mg/dL (H)).   Medical History: Past Medical History:  Diagnosis Date  . Carotid arterial disease ([place])    a. 02/2012 s/p L CEA.  . CKD stage G3b/A1, GFR 30-44 and albumin creatinine ratio <30 mg/g (HCC)   . Coronary artery disease    a. 02/2012 Lexi MV: Anterior, anteroseptal, septal, apical infarct with mild peri-infarct ischemia, EF 41%- medically managed due to anemia; b. 03/2017 Cath: LM nl, LAD 99ost/p/m - faint L->L collaterals, RI 20ost, LCX 70m, RCA 20p, 16m-->Med Rx for chronic subtotal LAD dzs. Avoid R Radial access in future.  . 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 Hyperlipidemia   . Hypertension   . Ischemic cardiomyopathy    a. 06/2013 Echo: EF 35-40%, glob HK, mildly dil LA, mild LVH, mild TR, mild to mod MR; b. 07/2016 Echo: EF 25-30%, sev mid-apicalanteroseptal, ant, apical HK, mild MR, sev dil LA, mod dil RA, mod TR, PASP 61mmHg; c. 11/2016 Echo: EF 20-25%, antsept DK, Gr2 DD, mild MR, mod dil LA/RV/RA, mod red RV fxn, mod-sev TR, PASP 40-56mmHg.  . Morbid obesity ([place])   . Normocytic anemia   . Persistent atrial fibrillation    a. CHA2DS2VASc = 7-->coumadin;  maintaining sinus on amio.  . Stroke [facility]) 2013    Assessment: Pharmacy consulted for warfarin dosing and monitoring in 68 yo female with PMH of A. Fib. Patient was taking warfarin PTA. Last dose reported on 9/3 AM. INR therapeutic on admission.   Home Regimen: Warfarin 5mg : Sun, Tues, Wed, Thurs, Sat                              Warfarin 7.5mg :  Mon, Fri   DATE INR DOSE 9/4 2.1        7.5 mg 9/5       1.9         5 mg  9/6       2.2         5 mg  Goal of Therapy:  INR 2-3 Monitor platelets by anticoagulation protocol: Yes   Plan:  INR therapeutic. Will order patient's home dose of 5mg  x 1 dose tonight.   Pharmacy will F/U with INR in AM  and continue to order warfarin based on levels.   Plan for CBC at least every 3 days per protocol. CBC stable.   Oswald Hillock, PharmD, BCPS Clinical Pharmacist 12/15/2018 11:17 AM

## 2018-12-15 NOTE — Progress Notes (Signed)
[facility] [place] NAME: Meghan [name]    MR#:  [id number]  DATE OF BIRTH:  [birth year]  SUBJECTIVE:   SOB, LEE improved No chest pain  REVIEW OF SYSTEMS:    Review of Systems  Constitutional: Negative for fever, chills weight loss HENT: Negative for ear pain, nosebleeds, congestion, facial swelling, rhinorrhea, neck pain, neck stiffness and ear discharge.   Respiratory: Negative for cough, ++IMPROVED shortness of breath, NO wheezing  Cardiovascular: Negative for chest pain, palpitations and+  leg swelling.  Gastrointestinal: Negative for heartburn, abdominal pain, vomiting, diarrhea or consitpation Genitourinary: Negative for dysuria, urgency, frequency, hematuria Musculoskeletal: Negative for back pain or joint pain Neurological: Negative for dizziness, seizures, syncope, focal weakness,  numbness and headaches.  Hematological: Does not bruise/bleed easily.  Psychiatric/Behavioral: Negative for hallucinations, confusion, dysphoric mood    Tolerating Diet: yes      DRUG ALLERGIES:  No Known Allergies  VITALS:  Blood pressure 110/68, pulse 65, temperature 97.8 F (36.6 C), temperature source Oral, resp. rate 16, height 5\' 2"  (1.575 m), weight 131.7 kg, SpO2 90 %.  PHYSICAL EXAMINATION:  Constitutional: Appears obese. No distress. HENT: Normocephalic. Marland Kitchen Oropharynx is clear and moist.  Eyes: Conjunctivae and EOM are normal. PERRLA, no scleral icterus.  Neck: Normal ROM. Neck supple. No JVD. No tracheal deviation. CVS: RRR, S1/S2 +, no murmurs, no gallops, no carotid bruit.  Pulmonary: Effort and breath sounds normal, no stridor, rhonchi, wheezes, rales.  Abdominal: Soft. BS +,  no distension, tenderness, rebound or guarding.  Musculoskeletal: Normal range of motion. 1+ LEE and no tenderness.  Neuro: Alert. CN 2-12 grossly intact. No focal deficits. Skin: Skin is warm and dry. No rash noted. Psychiatric: Normal mood and affect.       LABORATORY PANEL:   CBC Recent Labs  Lab 12/14/18 0529  WBC 5.7  HGB 10.9*  HCT 36.3  PLT 187   ------------------------------------------------------------------------------------------------------------------  Chemistries  Recent Labs  Lab 12/14/18 0529 12/15/18 0559  NA 142 138  K 4.4 3.7  CL 104 100  CO2 29 28  GLUCOSE 102* 125*  BUN 29* 31*  CREATININE 1.85* 1.70*  CALCIUM 8.5* 8.2*  MG 2.1  --    ------------------------------------------------------------------------------------------------------------------  Cardiac Enzymes No results for input(s): TROPONINI in the last 168 hours. ------------------------------------------------------------------------------------------------------------------  RADIOLOGY:  Dg Chest Portable 1 View  Result Date: 12/13/2018 CLINICAL DATA:  Shortness of breath. EXAM: PORTABLE CHEST 1 VIEW COMPARISON:  Chest x-ray dated July 26, 2016. FINDINGS: The patient is rotated to the right. Stable cardiomegaly. New pulmonary vascular congestion and mild diffuse interstitial opacities. Central peribronchial thickening. No consolidation, pneumothorax, or large pleural effusion. No acute osseous abnormality. IMPRESSION: 1. Mild interstitial pulmonary edema. Electronically Signed   By: Titus Dubin M.D.   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   1.  Acute on chronic hypoxic respiratory failure due to recurrence of atrial fibrillation and acute on chronic combined CHF exacerbation Wean to baseline as tolerated COVID testing negative.   2.  Hyperglycemia with acute metabolic encephalopathy from hypoglycemia: Patient is at her baseline.  Continue current management for diabetes. A1c 7.4 fairly  controlled.  3.  Persistent atrial fibrillation:Contine Amiodarone (Increased dose) and Coreg for heart  rate control Continue Coumadin as per pharmacy dosing Considering cardioversion prior to d/c or outpatient  [facility] consult appreciated  4.  Acute kidney injury stage III in the setting of CHF exacerbation: Creatinine is improving with diuresis 5.  Hypothyroidism: Continue Synthroid  6.  Acute on chronic combined systolic and diastolic heart failure ejection fraction 20 to 25%:   ECHO IMPRESSIONS    1. The left ventricle has severely reduced systolic function, with an ejection fraction of 25-30%. The cavity size was mildly dilated. Left ventricular diastolic Doppler parameters are indeterminate. Left ventricular diffuse hypokinesis.  2. The right ventricle has mildly reduced systolic function. The cavity was moderately enlarged. There is no increase in right ventricular wall thickness. Right ventricular systolic pressure is moderately elevated with an estimated pressure of 56.2  mmHg.  3. Left atrial size was severely dilated.  4. Right atrial size was moderately dilated.  5. Tricuspid valve regurgitation is moderate.  6. Rhythm is atrial fibrillation.  Continue Lasix 40 mg IV every 12 hours as per cardiology. Follow intake and output with daily weight CHF clinic upon discharge     Management plans discussed with the patient and she is in agreement.  CODE STATUS: full  TOTAL TIME TAKING CARE OF THIS PATIENT: 27 minutes.     POSSIBLE D/C 2 days with HHC, DEPENDING ON CLINICAL CONDITION.   Bettey Costa M.D on 12/15/2018 at 10:58 AM  Between 7am to 6pm - Pager - [phone] After 6pm go to [email or web address] - password [email or web address] [facility]  Office  [phone]  CC: Primary care physician; [name], MD  Note: This dictation was prepared with Dragon dictation along with smaller phrase technology. Any transcriptional errors that result from  this process are unintentional.

## 2018-12-15 NOTE — Discharge Summary (Signed)
[facility] at [place] NAME: Meghan [name]    MR#:  [id number]  DATE OF BIRTH:  [birth year]  DATE OF ADMISSION:  12/13/2018 ADMITTING PHYSICIAN: Jude Ojie, MD  DATE OF DISCHARGE: 12/15/2018  PRIMARY CARE PHYSICIAN: [name], MD    ADMISSION DIAGNOSIS:  Hypoglycemia [E16.2] Acute respiratory failure with hypoxia ([place]) [J96.01] AKI (acute kidney injury) ([place]) [N17.9]  DISCHARGE DIAGNOSIS:  Active Problems:   Acute on chronic systolic CHF (congestive heart failure) ([place])   SECONDARY DIAGNOSIS:   Past Medical History:  Diagnosis Date  . Carotid arterial disease ([place])    a. 02/2012 s/p L CEA.  . CKD stage G3b/A1, GFR 30-44 and albumin creatinine ratio <30 mg/g (HCC)   . Coronary artery disease    a. 02/2012 [facility] MV: Anterior, anteroseptal, septal, apical infarct with mild peri-infarct ischemia, EF 41%- medically managed due to anemia; b. 03/2017 Cath: LM nl, LAD 99ost/p/m - faint L->L collaterals, RI 20ost, LCX 77m, RCA 20p, 83m-->Med Rx for chronic subtotal LAD dzs. Avoid R Radial access in future.  . 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 Hyperlipidemia   . Hypertension   . Ischemic cardiomyopathy    a. 06/2013 Echo: EF 35-40%, glob HK, mildly dil LA, mild LVH, mild TR, mild to mod MR; b. 07/2016 Echo: EF 25-30%, sev mid-apicalanteroseptal, ant, apical HK, mild MR, sev dil LA, mod dil RA, mod TR, PASP 61mmHg; c. 11/2016 Echo: EF 20-25%, antsept DK, Gr2 DD, mild MR, mod dil LA/RV/RA, mod red RV fxn, mod-sev TR, PASP 40-18mmHg.  . Morbid obesity ([place])   . Normocytic anemia   . Persistent atrial fibrillation    a. CHA2DS2VASc = 7-->coumadin; maintaining sinus on amio.  . 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   1.  Acute on chronic hypoxic respiratory failure due to recurrence of atrial fibrillation and acute on chronic combined CHF exacerbation  COVID testing negative.   2.  Hyperglycemia with acute metabolic encephalopathy from hypoglycemia: Patient is at her baseline.  Continue current management for diabetes. A1c 7.4 fairly controlled.  3.  Persistent atrial fibrillation:Contine Amiodarone (Increased dose) and Coreg for heart rate control Continue Coumadinfor CVA prevention.Consider outpatient cardioversion. 4.  Acute kidney injury on CKD stage III in the setting of CHF exacerbation: Creatinine has improved with diuresis.   5.  Hypothyroidism: Continue Synthroid  6.  Acute on chronic combined systolic and diastolic heart failure ejection fraction 20 to 25%:   ECHO IMPRESSIONS   1. The left ventricle has severely reduced systolic function, with an ejection fraction of 25-30%. The cavity size was mildly dilated. Left ventricular diastolic Doppler parameters are indeterminate. Left ventricular diffuse hypokinesis. 2. The right ventricle has mildly reduced systolic function. The cavity was moderately enlarged. There is no increase in right ventricular wall thickness. Right ventricular systolic pressure is moderately elevated with an estimated pressure of 56.2  mmHg. 3. Left atrial size was severely dilated. 4. Right atrial size was moderately dilated. 5. Tricuspid valve regurgitation is moderate. 6. Rhythm is atrial fibrillation.  She will Continue Lasix 40 mg PO BID as per cardiology.Marlana Salvage has been referred to CHF clinic upon discharge She will continue Coreg, however due to low/nml pressure  ENTRESTO was discontinued and she will start LOW dose ARB as per Cardiology recommendations.    DISCHARGE CONDITIONS AND DIET:  Stable Diabetic cardiac diet  CONSULTS OBTAINED:  Treatment Team:  Minna Merritts, MD  DRUG ALLERGIES:  No Known  Allergies  DISCHARGE MEDICATIONS:   Allergies as of 12/15/2018   No Known Allergies     Medication List    STOP taking these medications   Entresto 97-103 MG Generic drug: sacubitril-valsartan     TAKE these medications   amiodarone 400 MG tablet Commonly known as: PACERONE Take 1 tablet (400 mg total) by mouth 2 (two) times daily. Take 400 mg twice a day for 5 days then 400 mg daily What changed:   medication strength  See the new instructions.   atorvastatin 40 MG tablet Commonly known as: LIPITOR Take 1 tablet by mouth once daily   carvedilol 6.25 MG tablet Commonly known as: COREG TAKE 1 TABLET BY MOUTH TWICE DAILY WITH A MEAL   ferrous sulfate 325 (65 FE) MG EC tablet Take 325 mg by mouth daily with breakfast.   furosemide 40 MG tablet Commonly known as: Lasix Take 1 tablet (40 mg total) by mouth 2 (two) times daily. What changed: when to take this   glipiZIDE 10 MG tablet Commonly known as: GLUCOTROL Take 10 mg by mouth 2 (two) times daily. Dr Honor Junes   Insulin Degludec 200 UNIT/ML Sopn Inject 28 Units into the skin daily. Dr Honor Junes   levothyroxine 25 MCG tablet Commonly known as: SYNTHROID Take 1 tablet (25 mcg total) by mouth daily before breakfast. What changed: additional instructions   losartan 25 MG tablet Commonly known as: Cozaar Take 1 tablet (25 mg total) by mouth daily.   triamcinolone cream 0.5 % Commonly known as: KENALOG Apply 1 application topically 2 (two) times daily.   warfarin 5 MG tablet Commonly known as: COUMADIN Take as directed. If you are unsure how to take this medication, talk to your nurse or doctor. Original instructions: USE AS DIRECTED BY COUMADIN CLINIC FOR 30 DAYS What changed: See the new instructions.         Today   CHIEF COMPLAINT:  Doing well  Daughter at bedside Shelly to go home today   VITAL SIGNS:  Blood pressure 110/68, pulse 65, temperature 97.8 F (36.6 C), temperature source Oral,  resp. rate 16, height 5\' 2"  (1.575 m), weight 131.7 kg, SpO2 90 %.   REVIEW OF SYSTEMS:  Review of Systems  Constitutional: Negative.  Negative for chills, fever and malaise/fatigue.  HENT: Negative.  Negative for ear discharge, ear pain, hearing loss, nosebleeds and sore throat.   Eyes: Negative.  Negative for blurred vision and pain.  Respiratory: Negative.  Negative for cough, hemoptysis, shortness of breath and wheezing.   Cardiovascular: Negative.  Negative for chest pain, palpitations and leg swelling.  Gastrointestinal: Negative.  Negative for abdominal pain, blood in stool, diarrhea, nausea and vomiting.  Genitourinary: Negative.  Negative for dysuria.  Musculoskeletal: Negative.  Negative for back pain.  Skin: Negative.   Neurological: Negative for dizziness, tremors, speech change, focal weakness, seizures and headaches.  Endo/Heme/Allergies: Negative.  Does not bruise/bleed easily.  Psychiatric/Behavioral: Negative.  Negative for depression, hallucinations and suicidal ideas.     PHYSICAL EXAMINATION:  GENERAL:  68 y.o.-year-old patient lying in the bed with no acute distress. obese NECK:  Supple, no jugular venous distention. No thyroid enlargement, no tenderness.  LUNGS: Normal breath sounds bilaterally, no wheezing, rales,rhonchi  No  use of accessory muscles of respiration.  CARDIOVASCULAR: IRR, IRR,. No murmurs, rubs, or gallops.  ABDOMEN: Soft, non-tender, non-distended. Bowel sounds present. No organomegaly or mass.  EXTREMITIES: No pedal edema, cyanosis, or clubbing.  PSYCHIATRIC: The patient is alert and oriented x 3.  SKIN: No obvious rash, lesion, or ulcer.   DATA REVIEW:   CBC Recent Labs  Lab 12/14/18 0529  WBC 5.7  HGB 10.9*  HCT 36.3  PLT 187    Chemistries  Recent Labs  Lab 12/14/18 0529 12/15/18 0559  NA 142 138  K 4.4 3.7  CL 104 100  CO2 29 28  GLUCOSE 102* 125*  BUN 29* 31*  CREATININE 1.85* 1.70*  CALCIUM 8.5* 8.2*  MG 2.1  --      Cardiac Enzymes No results for input(s): TROPONINI in the last 168 hours.  Microbiology Results  @MICRORSLT48 @  RADIOLOGY:  No results found.    Allergies as of 12/15/2018   No Known Allergies     Medication List    STOP taking these medications   Entresto 97-103 MG Generic drug: sacubitril-valsartan     TAKE these medications   amiodarone 400 MG tablet Commonly known as: PACERONE Take 1 tablet (400 mg total) by mouth 2 (two) times daily. Take 400 mg twice a day for 5 days then 400 mg daily What changed:   medication strength  See the new instructions.   atorvastatin 40 MG tablet Commonly known as: LIPITOR Take 1 tablet by mouth once daily   carvedilol 6.25 MG tablet Commonly known as: COREG TAKE 1 TABLET BY MOUTH TWICE DAILY WITH A MEAL   ferrous sulfate 325 (65 FE) MG EC tablet Take 325 mg by mouth daily with breakfast.   furosemide 40 MG tablet Commonly known as: Lasix Take 1 tablet (40 mg total) by mouth 2 (two) times daily. What changed: when to take this   glipiZIDE 10 MG tablet Commonly known as: GLUCOTROL Take 10 mg by mouth 2 (two) times daily. Dr Honor Junes   Insulin Degludec 200 UNIT/ML Sopn Inject 28 Units into the skin daily. Dr Honor Junes   levothyroxine 25 MCG tablet Commonly known as: SYNTHROID Take 1 tablet (25 mcg total) by mouth daily before breakfast. What changed: additional instructions   losartan 25 MG tablet Commonly known as: Cozaar Take 1 tablet (25 mg total) by mouth daily.   triamcinolone cream 0.5 % Commonly known as: KENALOG Apply 1 application topically 2 (two) times daily.   warfarin 5 MG tablet Commonly known as: COUMADIN Take as directed. If you are unsure how to take this medication, talk to your nurse or doctor. Original instructions: USE AS DIRECTED BY COUMADIN CLINIC FOR 30 DAYS What changed: See the new instructions.          Management plans discussed with the patient and daughter and they are  in  agreement. Stable for discharge home she refused [place]  Patient should follow up with cardiology  CODE STATUS:     Code Status Orders  (From admission, onward)         Start     Ordered   12/13/18 1411  Full code  Continuous     12/13/18 1415        Code Status History    Date Active Date Inactive Code Status Order ID Comments User Context   03/30/2017 2002 04/01/2017 1940 DNR [id number]  Demetrios Loll, MD Inpatient   03/12/2017 1236 03/12/2017 1720 Full Code [id number]  Wellington Hampshire,  MD Inpatient   09/21/2016 2056 09/24/2016 1922 Full Code [id number]  Theodoro Grist, MD Inpatient   07/26/2016 1530 07/28/2016 1812 Full Code [id number]  Theodoro Grist, MD Inpatient   Advance Care Planning Activity      TOTAL TIME TAKING CARE OF THIS PATIENT: 39 minutes.    Note: This dictation was prepared with Dragon dictation along with smaller phrase technology. Any transcriptional errors that result from this process are unintentional.  Bettey Costa M.D on 12/15/2018 at 1:45 PM  Between 7am to 6pm - Pager - [phone] After 6pm go to [email or web address] - password [email or web address] [facility]  Office  [phone]  CC: Primary care physician; [name], MD

## 2018-12-17 ENCOUNTER — Telehealth: Payer: Self-pay

## 2018-12-17 ENCOUNTER — Encounter (INDEPENDENT_AMBULATORY_CARE_PROVIDER_SITE_OTHER): Payer: Medicare Other

## 2018-12-17 LAB — GLUCOSE, CAPILLARY: Glucose-Capillary: 26 mg/dL — CL (ref 70–99)

## 2018-12-17 LAB — HIV ANTIBODY (ROUTINE TESTING W REFLEX): HIV Screen 4th Generation wRfx: NONREACTIVE

## 2018-12-17 NOTE — Telephone Encounter (Signed)
Left message for patient for TCM call and need to schedule hospital follow up appt. Pt may contact me directly at [phone] or call the office at [phone].

## 2018-12-20 NOTE — Progress Notes (Signed)
Cardiology Office Note    Date:  12/24/2018   ID:  Meghan [name], DOB [birth year], MRN [id number]  PCP:  [name], MD  Cardiologist:  Kathlyn Sacramento, MD  Electrophysiologist:  None   Chief Complaint: Hospital follow-up  History of Present Illness:   Meghan [name] is a 68 y.o. female with history of CAD outlined below, HFrEF secondary to ICM, persistent A. fib on amiodarone and Coumadin, CVA, significant hyperkalemia in the setting of renal failure and spironolactone, CKD stage II, carotid artery disease status post left-sided carotid endarterectomy, chronic lower extremity swelling, insulin-dependent diabetes mellitus, anemia, hypothyroidism, and hypertension who presents for hospital follow-up as detailed below.  Prior [employer] from 2013 showed anterior, anteroseptal, septal, apical infarct with mild peri-infarct ischemia with an EF of 41%.  Patient was medically managed in setting of underlying anemia.  Echo in 11/2016 showed a severely reduced LV systolic function with an EF of 20 to 25%, mild mitral regurgitation, moderate to severe tricuspid regurgitation, and moderate pulmonary hypertension with a PASP around 50 mmHg.  Most recent cath from 03/2017 showed significant one-vessel CAD with subtotal occlusion of the ostial and mid segment of the LAD with faint left to left collaterals.  There was mild to moderate LCx and RCA disease.  Right heart cath showed moderately elevated filling pressure with a pulmonary capillary wedge pressure of 23 mmHg, moderate pulmonary hypertension and mildly reduced cardiac output.  Medical management was recommended.  She was seen virtually in 09/2018 and doing well from a cardiac perspective.  Patient was recently admitted to the hospital in early 12/2018 with lethargy with glucose noted to be in the 30s to 40s upon arrival.  She was found to be mildly volume overloaded, possibly in the setting of recurrent A. fib with duration of arrhythmia uncertain.   Echo during the admission showed an EF of 25 to 30%, mildly dilated LV cavity, diffuse hypokinesis, mildly reduced RV systolic function with moderately dilated RV cavity, PASP 56.2 mmHg, severely dilated left atrium, moderately dilated right atrium, moderate tricuspid regurgitation, mild mitral regurgitation, rhythm of A. fib.  Breathing improved with IV diuresis.  High-sensitivity troponin negative.  With bump in renal function during admission Entresto was held at discharge.  Documented discharge weight: 131.7 kg  Discharge labs: INR 2.2 (patient was bridged with Lovenox for an INR of 1.91-day prior to discharge), potassium 3.7, serum creatinine 1.7, TSH normal, A1c 7.4, magnesium 2.1, Hgb 10.9, PLT 187   In follow-up today, she continues to note significant fluctuations in her blood glucose levels with a reading around 2 AM this morning of 30 prompting the patient to drink a 2 L of regular Coca-Cola.  Since then, she has not checked her blood sugar or eaten anything.  She also notes readings as low as 19 in the past several weeks.  She has not discussed this with her endocrinologist or PCP.  She continues to note some shortness of breath though this is stable.  Lower extremity swelling is improving.  She denies any chest pain or palpitations.  She is uncertain what her weight has been running at home.  Compliant with all medications.  No falls, BRBPR, or melena.  Past Medical History:  Diagnosis Date  . Carotid arterial disease ([place])    a. 02/2012 s/p L CEA.  . CKD stage G3b/A1, GFR 30-44 and albumin creatinine ratio <30 mg/g (HCC)   . Coronary artery disease    a. 02/2012 [facility] MV: Anterior, anteroseptal, septal,  apical infarct with mild peri-infarct ischemia, EF 41%- medically managed due to anemia; b. 03/2017 Cath: LM nl, LAD 99ost/p/m - faint L->L collaterals, RI 20ost, LCX 21m, RCA 20p, 42m-->Med Rx for chronic subtotal LAD dzs. Avoid R Radial access in future.  . Diabetes mellitus without  complication (HCC)    Type II  . Hyperlipidemia   . Hypertension   . Ischemic cardiomyopathy    a. 06/2013 Echo: EF 35-40%, glob HK, mildly dil LA, mild LVH, mild TR, mild to mod MR; b. 07/2016 Echo: EF 25-30%, sev mid-apicalanteroseptal, ant, apical HK, mild MR, sev dil LA, mod dil RA, mod TR, PASP 72mmHg; c. 11/2016 Echo: EF 20-25%, antsept DK, Gr2 DD, mild MR, mod dil LA/RV/RA, mod red RV fxn, mod-sev TR, PASP 40-59mmHg.  . Morbid obesity ([place])   . Normocytic anemia   . Persistent atrial fibrillation    a. CHA2DS2VASc = 7-->coumadin; maintaining sinus on amio.  . Stroke [facility]) 2013    Past Surgical History:  Procedure Laterality Date  . AMPUTATION TOE Left 09/22/2016   Procedure: AMPUTATION TOE;  Surgeon: Albertine Patricia, DPM;  Location: [facility] ORS;  Service: Podiatry;  Laterality: Left;  . CARDIOVERSION N/A 07/28/2016   Procedure: CARDIOVERSION;  Surgeon: Wellington Hampshire, MD;  Location: [facility] ORS;  Service: Cardiovascular;  Laterality: N/A;  . CARDIOVERSION N/A 08/07/2016   Procedure: CARDIOVERSION;  Surgeon: Wellington Hampshire, MD;  Location: [facility] ORS;  Service: Cardiovascular;  Laterality: N/A;  . CAROTID ENDARTERECTOMY     [facility]; Dr. Lucky Cowboy  . CHOLECYSTECTOMY    . foot infection Right   . LEG SURGERY     right;infection  . RIGHT/LEFT HEART CATH AND CORONARY ANGIOGRAPHY Bilateral 03/12/2017   Procedure: RIGHT/LEFT HEART CATH AND CORONARY ANGIOGRAPHY;  Surgeon: Wellington Hampshire, MD;  Location: [place] [facility];  Service: Cardiovascular;  Laterality: Bilateral;  . TONSILLECTOMY      Current Medications: Current Meds  Medication Sig  . amiodarone (PACERONE) 400 MG tablet Take 1 tablet (400 mg total) by mouth 2 (two) times daily. Take 400 mg twice a day for 5 days then 400 mg daily  . atorvastatin (LIPITOR) 40 MG tablet Take 1 tablet by mouth once daily  . carvedilol (COREG) 6.25 MG tablet TAKE 1 TABLET BY MOUTH TWICE DAILY WITH A MEAL  . furosemide (LASIX) 40 MG tablet Take 1 tablet  (40 mg total) by mouth 2 (two) times daily.  Marland Kitchen glipiZIDE (GLUCOTROL) 10 MG tablet Take 10 mg by mouth 2 (two) times daily. Dr Honor Junes  . Insulin Degludec 200 UNIT/ML SOPN Inject 28 Units into the skin daily. Dr Honor Junes  . levothyroxine (SYNTHROID, LEVOTHROID) 25 MCG tablet Take 1 tablet (25 mcg total) by mouth daily before breakfast. (Patient taking differently: Take 25 mcg by mouth daily before breakfast. Dr Fletcher Anon)  . losartan (COZAAR) 25 MG tablet Take 1 tablet (25 mg total) by mouth daily.  Marland Kitchen triamcinolone cream (KENALOG) 0.5 % Apply 1 application topically 2 (two) times daily.  Marland Kitchen warfarin (COUMADIN) 5 MG tablet USE AS DIRECTED BY COUMADIN CLINIC FOR 30 DAYS (Patient taking differently: Take 5-7.5 mg by mouth daily at 6 PM. Take one 5 mg tablet Sunday, Tuesday, Wednesday, Thursday, and Saturday. And one and one-half tablets 7.5 mg on Monday and Friday)    Allergies:   Patient has no known allergies.   Social History   Socioeconomic History  . Marital status: Widowed    Spouse name: Not on file  . Number of  children: Not on file  . Years of education: Not on file  . Highest education level: Not on file  Occupational History  . Not on file  Social Needs  . Financial resource strain: Not on file  . Food insecurity    Worry: Not on file    Inability: Not on file  . Transportation needs    Medical: Not on file    Non-medical: Not on file  Tobacco Use  . Smoking status: Former Smoker    Packs/day: 0.50    Years: 5.00    Pack years: 2.50    Types: Cigarettes  . Smokeless tobacco: Never Used  Substance and Sexual Activity  . Alcohol use: No  . Drug use: No  . Sexual activity: Not Currently  Lifestyle  . Physical activity    Days per week: Not on file    Minutes per session: Not on file  . Stress: Not on file  Relationships  . Social Herbalist on phone: Not on file    Gets together: Not on file    Attends religious service: Not on file    Active member of  club or organization: Not on file    Attends meetings of clubs or organizations: Not on file    Relationship status: Not on file  Other Topics Concern  . Not on file  Social History Narrative  . Not on file     Family History:  The patient's family history includes Diabetes in her maternal grandmother and mother; Heart disease in her father and mother; Hypertension in her father.  ROS:   Review of Systems  Constitutional: Positive for malaise/fatigue. Negative for chills, diaphoresis, fever and weight loss.  HENT: Negative for congestion.   Eyes: Negative for discharge and redness.  Respiratory: Positive for shortness of breath. Negative for cough, hemoptysis, sputum production and wheezing.   Cardiovascular: Positive for leg swelling. Negative for chest pain, palpitations, orthopnea, claudication and PND.  Gastrointestinal: Negative for abdominal pain, blood in stool, heartburn, melena, nausea and vomiting.  Genitourinary: Negative for hematuria.  Musculoskeletal: Negative for falls and myalgias.  Skin: Negative for rash.  Neurological: Positive for dizziness and weakness. Negative for tingling, tremors, sensory change, speech change, focal weakness and loss of consciousness.  Endo/Heme/Allergies: Does not bruise/bleed easily.  Psychiatric/Behavioral: Negative for substance abuse. The patient is not nervous/anxious.   All other systems reviewed and are negative.    EKGs/Labs/Other Studies Reviewed:    Studies reviewed were summarized above. The additional studies were reviewed today:  2D echo 12/14/2018: 1. The left ventricle has severely reduced systolic function, with an ejection fraction of 25-30%. The cavity size was mildly dilated. Left ventricular diastolic Doppler parameters are indeterminate. Left ventricular diffuse hypokinesis.  2. The right ventricle has mildly reduced systolic function. The cavity was moderately enlarged. There is no increase in right ventricular wall  thickness. Right ventricular systolic pressure is moderately elevated with an estimated pressure of 56.2  mmHg.  3. Left atrial size was severely dilated.  4. Right atrial size was moderately dilated.  5. Tricuspid valve regurgitation is moderate.  6. Rhythm is atrial fibrillation   EKG:  EKG is ordered today.  The EKG ordered today demonstrates A. fib, 63 bpm, possible prior septal infarct, lateral T wave inversion unchanged from prior  Recent Labs: 12/13/2018: B Natriuretic Peptide 1,269.0 12/14/2018: Hemoglobin 10.9; Magnesium 2.1; Platelets 187; TSH 3.975 12/15/2018: BUN 31; Creatinine, Ser 1.70; Potassium 3.7; Sodium 138  Recent  Lipid Panel    Component Value Date/Time   CHOL 67 (L) 12/27/2016 0826   CHOL 165 02/14/2012 0410   TRIG 45 12/27/2016 0826   TRIG 168 02/14/2012 0410   HDL 23 (L) 12/27/2016 0826   HDL 31 (L) 02/14/2012 0410   CHOLHDL 2.9 12/27/2016 0826   VLDL 34 02/14/2012 0410   LDLCALC 35 12/27/2016 0826   LDLCALC 100 02/14/2012 0410    PHYSICAL EXAM:    VS:  BP 120/80 (BP Location: Right Arm, Patient Position: Sitting, Cuff Size: Normal)   Pulse 63   Ht 5\' 7"  (1.702 m)   BMI 46.19 kg/m   BMI: Body mass index is 46.19 kg/m.  Physical Exam  Constitutional: She is oriented to person, place, and time. She appears well-developed and well-nourished.  HENT:  Head: Normocephalic and atraumatic.  Eyes: Right eye exhibits no discharge. Left eye exhibits no discharge.  Neck: Normal range of motion. No JVD present.  JVD difficult to assess secondary to body habitus  Cardiovascular: Normal rate, S1 normal, S2 normal and normal heart sounds. An irregularly irregular rhythm present. Exam reveals no distant heart sounds, no friction rub, no midsystolic click and no opening snap.  No murmur heard. Pulses:      Posterior tibial pulses are 2+ on the right side and 2+ on the left side.  Pulmonary/Chest: Effort normal. No respiratory distress. She has no decreased breath  sounds. She has no wheezes. She has rales in the right lower field and the left lower field. She exhibits no tenderness.  Abdominal: Soft. She exhibits no distension. There is no abdominal tenderness.  Musculoskeletal:        General: Edema present.     Comments: 1+ bilateral lower extremity pitting edema with compression stockings in place  Neurological: She is alert and oriented to person, place, and time.  Skin: Skin is warm and dry. No cyanosis. Nails show no clubbing.  Psychiatric: She has a normal mood and affect. Her speech is normal and behavior is normal. Judgment and thought content normal.    Wt Readings from Last 3 Encounters:  12/15/18 294 lb 14.4 oz (133.8 kg)  09/24/18 269 lb (122 kg)  06/27/18 269 lb (122 kg)     ASSESSMENT & PLAN:   1. CAD involving the native coronary arteries without angina: She denies any symptoms of chest pain.  Most recent cardiac cath from 2018 with continued medical therapy recommended as outlined above.  Continue secondary prevention with Coumadin in place of aspirin, Lipitor, and Coreg.  Following restoration of sinus rhythm she will need updated echo as outlined below.  Further ischemic evaluation pending repeat limited echo.  2. 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  Volume status is somewhat difficult to assess secondary to her body habitus and inability to stand for weight today.  However, she is noted to have bilateral crackles along the bases and remains in rate controlled A. fib with known cardiomyopathy with an EF of 20 to 25%.  Given loss of atrial kick and in the setting of her cardiomyopathy she is certainly more prone to hold on to fluid at a much quicker pace.  We will increase her Lasix to 80 mg twice daily x2 days followed by resumption of 40 mg twice daily thereafter.  We will check a BMP today and again in 1 week.  If renal function remains stable would recommend discontinuing losartan and  transitioning her  back to [place] given her cardiomyopathy.  She remains off spironolactone given known history of CKD with prior hyperkalemia.  Following restoration of sinus rhythm we will need to repeat limited echo to reevaluate her LV systolic function.  If this remains reduced we will need to pursue further ischemic evaluation.  3. Persistent A. Fib: She remains in A. fib with controlled ventricular response.  Most recent INR of 2.2 from 12/15/2018.  Of note, she did have an INR of 1.9 from 9/5 though inpatient note indicates she was bridged with Lovenox for this.  Continue warfarin per Coumadin clinic.  Decrease amiodarone to 200 mg daily.  Recent TSH and liver function normal.  Schedule cardioversion for next week.  4. CKD stage II with prior hyperkalemia: Most recent serum creatinine stable at 1.7.  She remains off spironolactone given underlying chronic kidney disease and history of hyperkalemia with this medication.  5. Carotid artery disease: Status post left-sided CEA.  Update carotid artery ultrasound at next visit.  6. History of CVA: No residual deficits or new strokelike symptoms.  Remains on Coumadin in place of aspirin.  LDL at goal as outlined below.  7. HTN: Blood pressure is well controlled at 120/80.  Continue current therapy as outlined above.  8. HLD: Most recent LDL of 33 from 12/03/2018 with goal LDL less than 70.  Remains on atorvastatin 40 mg daily.  9. Hypothyroidism: Most recent TSH normal on low-dose levothyroxine.  Possibly in the setting of amiodarone usage.  10. Insulin-dependent diabetes: She continues to struggle with extreme fluctuations in her blood glucose with readings dipping down into the 30s with the patient reported value of 19 several weeks prior.  She indicates she woke up this morning around 2 AM and checked her blood sugar with a reading of 30 and drink a 2 L of coke.  She has not checked her blood sugar since then nor has she eaten anything.  Office  blood sugar this morning of 74.  Patient was given peanut butter crackers in the office and advised to go home and eat as well as to contact her PCP and endocrinologist today for further instructions.  Any further management of this issue is deferred to them.  11. Lower extremity swelling: Likely multifactorial including dependent edema in the setting of morbid obesity, venous insufficiency, and acute on chronic HFrEF.  Diuresis as above.  Continue compression stockings.  Follow-up with vascular surgery as directed.   Disposition: F/u with Dr. Fletcher Anon or an APP in 1 week.   Medication Adjustments/Labs and Tests Ordered: Current medicines are reviewed at length with the patient today.  Concerns regarding medicines are outlined above. Medication changes, Labs and Tests ordered today are summarized above and listed in the Patient Instructions accessible in Encounters.   Signed, Christell Faith, PA-C 12/24/2018 9:23 AM     [facility] [address] [place], [place] [address] [phone]

## 2018-12-24 ENCOUNTER — Encounter (INDEPENDENT_AMBULATORY_CARE_PROVIDER_SITE_OTHER): Payer: Medicare Other

## 2018-12-24 ENCOUNTER — Other Ambulatory Visit: Payer: Self-pay

## 2018-12-24 ENCOUNTER — Encounter: Payer: Self-pay | Admitting: Physician Assistant

## 2018-12-24 ENCOUNTER — Ambulatory Visit (INDEPENDENT_AMBULATORY_CARE_PROVIDER_SITE_OTHER): Payer: Medicare Other | Admitting: Physician Assistant

## 2018-12-24 VITALS — BP 120/80 | HR 63 | Ht 67.0 in

## 2018-12-24 DIAGNOSIS — I6522 Occlusion and stenosis of left carotid artery: Secondary | ICD-10-CM

## 2018-12-24 DIAGNOSIS — E785 Hyperlipidemia, unspecified: Secondary | ICD-10-CM | POA: Diagnosis not present

## 2018-12-24 DIAGNOSIS — I4891 Unspecified atrial fibrillation: Secondary | ICD-10-CM

## 2018-12-24 DIAGNOSIS — I10 Essential (primary) hypertension: Secondary | ICD-10-CM | POA: Diagnosis not present

## 2018-12-24 DIAGNOSIS — E119 Type 2 diabetes mellitus without complications: Secondary | ICD-10-CM | POA: Diagnosis not present

## 2018-12-24 DIAGNOSIS — Z794 Long term (current) use of insulin: Secondary | ICD-10-CM

## 2018-12-24 DIAGNOSIS — E039 Hypothyroidism, unspecified: Secondary | ICD-10-CM | POA: Diagnosis not present

## 2018-12-24 DIAGNOSIS — I4819 Other persistent atrial fibrillation: Secondary | ICD-10-CM | POA: Diagnosis not present

## 2018-12-24 DIAGNOSIS — I5023 Acute on chronic systolic (congestive) heart failure: Secondary | ICD-10-CM | POA: Diagnosis not present

## 2018-12-24 DIAGNOSIS — Z8673 Personal history of transient ischemic attack (TIA), and cerebral infarction without residual deficits: Secondary | ICD-10-CM | POA: Diagnosis not present

## 2018-12-24 DIAGNOSIS — I2510 Atherosclerotic heart disease of native coronary artery without angina pectoris: Secondary | ICD-10-CM

## 2018-12-24 DIAGNOSIS — IMO0001 Reserved for inherently not codable concepts without codable children: Secondary | ICD-10-CM

## 2018-12-24 DIAGNOSIS — N182 Chronic kidney disease, stage 2 (mild): Secondary | ICD-10-CM

## 2018-12-24 MED ORDER — AMIODARONE HCL 400 MG PO TABS: 200.0000 mg | ORAL_TABLET | Freq: Every day | ORAL | 11 refills | Status: DC

## 2018-12-24 MED ORDER — FUROSEMIDE 40 MG PO TABS: 40.0000 mg | ORAL_TABLET | Freq: Two times a day (BID) | ORAL | 11 refills | Status: DC

## 2018-12-24 NOTE — Addendum Note (Signed)
Addended by: Verlon Au on: 12/24/2018 11:35 AM   Modules accepted: Orders

## 2018-12-24 NOTE — Patient Instructions (Signed)
Medication Instructions:  1- DECREASE Amiodarone Take 0.5 tablets (200 mg total) by mouth daily. 2- INCREASE Lasix to Take 2 tablets (80 mg total) twice daily for 2 days, then resume 1 tablet (40 mg total) twice daily thereafter. If you need a refill on your cardiac medications before your next appointment, please call your pharmacy.   Lab work: 1- Your physician recommends that you have lab work today(BMET, CBC)  2- Your physician recommends that you return for lab work 9/[facility] at the [facility]. (pt/inr/bmet/cbc) No appt is needed. Hours are M-F 7AM- 6 PM.   3- CV19 Pre admit testing DRIVE THRU  Please report to the PAT testing site ([facility]) on _____9/22____ date ______12:30-2:30PM_____ time for your DRIVE THRU covid testing that is required prior to your procedure.  Following covid testing, please remain in quarantine. If you must be around others, please wash hands, avoid touching face and wear your mask.   If you have labs (blood work) drawn today and your tests are completely normal, you will receive your results only by: Marland Kitchen MyChart Message (if you have MyChart) OR . A paper copy in the mail If you have any lab test that is abnormal or we need to change your treatment, we will call you to review the results.  Testing/Procedures: 1-  You are scheduled for a Cardioversion on 9/25 with Dr. Rockey Situ.  Please arrive at the [place] [facility] at 6:30 am. (1 hour prior to procedure unless lab work is needed; if lab work is needed arrive 1.5 hours ahead)  DIET: Nothing to eat or drink after midnight except a sip of water with medications (see medication instructions below)  Medication Instructions: Hold (fluid pill/DM meds) Glipizide, Lasix  Continue your anticoagulant: Warfarin You will need to continue your anticoagulant after your procedure until you are told by your  Provider that it is safe to stop You must have a responsible person to drive you home and stay in the  waiting area during your procedure. Failure to do so could result in cancellation.  Bring your insurance cards.  *Special Note: Every effort is made to have your procedure done on time. Occasionally there are emergencies that occur at the hospital that may cause delays. Please be patient if a delay does occur.     Follow-Up: At [facility], you and your health needs are our priority.  As part of our continuing mission to provide you with exceptional heart care, we have created designated Provider Care Teams.  These Care Teams include your primary Cardiologist (physician) and Advanced Practice Providers (APPs -  Physician Assistants and Nurse Practitioners) who all work together to provide you with the care you need, when you need it. You will need a follow up appointment in 2 weeks. You may see Kathlyn Sacramento, MD or Christell Faith, PA-C.

## 2018-12-25 ENCOUNTER — Telehealth: Payer: Self-pay

## 2018-12-25 LAB — CBC
Hematocrit: 35.2 % (ref 34.0–46.6)
Hemoglobin: 10.8 g/dL — ABNORMAL LOW (ref 11.1–15.9)
MCH: 26 pg — ABNORMAL LOW (ref 26.6–33.0)
MCHC: 30.7 g/dL — ABNORMAL LOW (ref 31.5–35.7)
MCV: 85 fL (ref 79–97)
Platelets: 176 10*3/uL (ref 150–450)
RBC: 4.16 x10E6/uL (ref 3.77–5.28)
RDW: 16.3 % — ABNORMAL HIGH (ref 11.7–15.4)
WBC: 5.6 10*3/uL (ref 3.4–10.8)

## 2018-12-25 LAB — BASIC METABOLIC PANEL
BUN/Creatinine Ratio: 17 (ref 12–28)
BUN: 41 mg/dL — ABNORMAL HIGH (ref 8–27)
CO2: 23 mmol/L (ref 20–29)
Calcium: 8.6 mg/dL — ABNORMAL LOW (ref 8.7–10.3)
Chloride: 95 mmol/L — ABNORMAL LOW (ref 96–106)
Creatinine, Ser: 2.44 mg/dL — ABNORMAL HIGH (ref 0.57–1.00)
GFR calc Af Amer: 22 mL/min/{1.73_m2} — ABNORMAL LOW (ref >59)
GFR calc non Af Amer: 19 mL/min/{1.73_m2} — ABNORMAL LOW (ref >59)
Glucose: 58 mg/dL — ABNORMAL LOW (ref 65–99)
Potassium: 4.9 mmol/L (ref 3.5–5.2)
Sodium: 135 mmol/L (ref 134–144)

## 2018-12-25 NOTE — Telephone Encounter (Signed)
Attempted to call the patient. No answer- I left a message to please call back.  

## 2018-12-25 NOTE — Telephone Encounter (Signed)
-----   Message from Rise Mu, PA-C sent at 12/25/2018  8:56 AM EDT ----- Blood count remains low, though stable.  Renal function is worse than prior indicating she is dehydrated.  Do not increase Lasix like was discussed at her office visit.  Hold Lasix for 2 days then resume at 40 mg daily.  Hold losartan, will look to resume after recheck BMP next week.  Check BMP in 1 week.

## 2018-12-25 NOTE — Telephone Encounter (Signed)
Attempted to call patient. LMTCB 12/25/2018

## 2018-12-26 DIAGNOSIS — E11649 Type 2 diabetes mellitus with hypoglycemia without coma: Secondary | ICD-10-CM | POA: Diagnosis not present

## 2018-12-26 DIAGNOSIS — Z794 Long term (current) use of insulin: Secondary | ICD-10-CM | POA: Diagnosis not present

## 2018-12-26 NOTE — Progress Notes (Deleted)
   Patient ID: Meghan [name], female    DOB: 1951/03/22, 68 y.o.   MRN: [id number]  HPI  Meghan [name] is a 68 y/o female with a history of  Echo report from 12/14/2018 reviewed and showed an EF of 25-30% along with moderate TR, severely dilated left atrium and an elevated PA pressure of 56.2 mmHg.   RHC/LHC done 03/12/17 and showed:  Ost LAD to Prox LAD lesion is 99% stenosed.  Prox LAD to Mid LAD lesion is 99% stenosed.  Ost Ramus lesion is 20% stenosed.  Mid Cx lesion is 40% stenosed.  Prox RCA to Mid RCA lesion is 20% stenosed.  Mid RCA to Dist RCA lesion is 40% stenosed.   1.  Significant severe one-vessel coronary artery disease with subtotal occlusion of the LAD at the ostium and mid segment with faint left to left collaterals.  Mild to moderate left circumflex and RCA disease. 2.  Left ventricular angiography was not performed.  Aortic valve could not be crossed due to severe tortuosity of the innominate artery 3.  Right heart catheterization showed moderately elevated filling pressures with a wedge pressure of 23 mmHg, moderate pulmonary hypertension with a pressure of 48 of 19 mmHg and mild reduced cardiac output at 4.71 with a cardiac index of 2.25.  Admitted 12/13/2018 due to acute on chronic HF along with atrial fibrillation. Cardiology consult obtained. COVID test negative. Discharged after 2 days.   Patient presents today for her initial visit with a chief complaint of   Review of Systems    Physical Exam    Assessment & Plan:  1: Chronic heart failure with reduced ejection fraction- - NYHA class - saw cardiology ([place]) 12/24/2018 - BNP 12/13/2018 was 1269.0  2: HTN- - BP - saw PCP Ronnald Ramp) 06/27/2018 - BMP 12/24/2018 reviewed and showed sodium 135, potassium 4.9, creatinine 2.44 and GFR 19  3: Atrial fibrillation-  - scheduled for cardioversion on 01/03/2019  4: DM- - saw endocrinology Honor Junes) 12/03/2018 - A1c 12/14/2018 was 7.4%

## 2018-12-27 ENCOUNTER — Ambulatory Visit: Payer: Medicare Other | Admitting: Family

## 2018-12-27 ENCOUNTER — Telehealth: Payer: Self-pay | Admitting: Family

## 2018-12-27 NOTE — Telephone Encounter (Signed)
Attempted to call the patient. No answer- unable to leave a message as the mailbox if full.

## 2018-12-27 NOTE — Telephone Encounter (Signed)
Patient did not show for her Heart Failure Clinic appointment on 12/27/2018. Will attempt to reschedule.

## 2018-12-30 ENCOUNTER — Other Ambulatory Visit
Admission: RE | Admit: 2018-12-30 | Discharge: 2018-12-30 | Disposition: A | Discharge status: Home / Self Care | Payer: Medicare Other | Attending: Cardiovascular Disease | Admitting: Cardiovascular Disease

## 2018-12-30 ENCOUNTER — Ambulatory Visit (INDEPENDENT_AMBULATORY_CARE_PROVIDER_SITE_OTHER): Payer: Medicare Other

## 2018-12-30 ENCOUNTER — Other Ambulatory Visit: Payer: Self-pay

## 2018-12-30 DIAGNOSIS — Z5181 Encounter for therapeutic drug level monitoring: Secondary | ICD-10-CM

## 2018-12-30 DIAGNOSIS — Z20828 Contact with and (suspected) exposure to other viral communicable diseases: Secondary | ICD-10-CM | POA: Insufficient documentation

## 2018-12-30 DIAGNOSIS — I4891 Unspecified atrial fibrillation: Secondary | ICD-10-CM

## 2018-12-30 DIAGNOSIS — I4819 Other persistent atrial fibrillation: Secondary | ICD-10-CM

## 2018-12-30 LAB — PROTIME-INR
INR: 11.7 (ref 0.8–1.2)
Prothrombin Time: 88.2 seconds — ABNORMAL HIGH (ref 11.4–15.2)

## 2018-12-30 LAB — CBC
HCT: 33.4 % — ABNORMAL LOW (ref 36.0–46.0)
Hemoglobin: 10.3 g/dL — ABNORMAL LOW (ref 12.0–15.0)
MCH: 25.7 pg — ABNORMAL LOW (ref 26.0–34.0)
MCHC: 30.8 g/dL (ref 30.0–36.0)
MCV: 83.3 fL (ref 80.0–100.0)
Platelets: 193 10*3/uL (ref 150–400)
RBC: 4.01 MIL/uL (ref 3.87–5.11)
RDW: 17.6 % — ABNORMAL HIGH (ref 11.5–15.5)
WBC: 5.4 10*3/uL (ref 4.0–10.5)
nRBC: 0 % (ref 0.0–0.2)

## 2018-12-30 LAB — BASIC METABOLIC PANEL
Anion gap: 8 (ref 5–15)
BUN: 55 mg/dL — ABNORMAL HIGH (ref 8–23)
CO2: 28 mmol/L (ref 22–32)
Calcium: 8.7 mg/dL — ABNORMAL LOW (ref 8.9–10.3)
Chloride: 99 mmol/L (ref 98–111)
Creatinine, Ser: 2.39 mg/dL — ABNORMAL HIGH (ref 0.44–1.00)
GFR calc Af Amer: 23 mL/min — ABNORMAL LOW (ref >60)
GFR calc non Af Amer: 20 mL/min — ABNORMAL LOW (ref >60)
Glucose, Bld: 163 mg/dL — ABNORMAL HIGH (ref 70–99)
Potassium: 4.3 mmol/L (ref 3.5–5.1)
Sodium: 135 mmol/L (ref 135–145)

## 2018-12-30 NOTE — Progress Notes (Signed)
Received results of STAT labs - INR is critical @ 11.7. Spoke w/ Marcelle Overlie, pharmacist who recommends Vit K 2.5 mg PO today. Advised her that I am unsure if pt will be able to get this due to transportation and financial issues. Spoke w/ Dr. Fletcher Anon and he agrees that pt should ideally have Vit K. Spoke w/ pt and advised her of their recommendations.  She states that her daughter works 3rd shift and just went home to get some sleep before she has to be at work, so she does not want to bother her.   She asks if [facility] in [place] carries Vit K, as that is the only pharmacy she will use (she is a former employee & daughter works there now). Spoke w/ [facility] pharmacist and he reports that they do not carry prescription Vit K, that they can order it, but it is very pricey. He placed me on hold and came back and advised that they have OTC Vit K, bottle of #60 for $10 that pt should take 25 of the 200 tablets. Spoke w/ our pharmacy regarding the math and was advised that pt should take 10 tablets instead of 25. Called pt and advised her to hold warfarin until advised after recheck, have a large serving of greens (she reports that someone is on their way to her home w/ a large salad) and proceed to [facility] and tell the pharmacist that she is there to pick up OTC Vit K, then will take 10 of these tablets. She verbalizes understanding, repeats instructions back to me and is agreeable.  She will come in for INR recheck on Wed, 9/23 @ 3:00 and Dr. Fletcher Anon will determine whether to proceed w/ DCCV on Friday or reschedule.

## 2018-12-30 NOTE — Patient Instructions (Signed)
Spoke w/ pt.  Advised her to hold warfarin until recheck, have a large serving of greens (she reports that someone is on their way to her home w/ a large salad) and proceed to [facility] and tell the pharmacist that she is there to pick up OTC Vit K, then will take 10 of these tablets. Recheck INR on Wed - Dr. Fletcher Anon will decide whether to proceed w/ scheduled DCCV at that time.

## 2018-12-31 ENCOUNTER — Telehealth: Payer: Self-pay

## 2018-12-31 ENCOUNTER — Other Ambulatory Visit
Admission: RE | Admit: 2018-12-31 | Discharge: 2018-12-31 | Disposition: A | Discharge status: Home / Self Care | Payer: Medicare Other | Source: Ambulatory Visit | Attending: Cardiovascular Disease | Admitting: Cardiovascular Disease

## 2018-12-31 DIAGNOSIS — I4819 Other persistent atrial fibrillation: Secondary | ICD-10-CM | POA: Diagnosis not present

## 2018-12-31 DIAGNOSIS — I4891 Unspecified atrial fibrillation: Secondary | ICD-10-CM

## 2018-12-31 DIAGNOSIS — Z20828 Contact with and (suspected) exposure to other viral communicable diseases: Secondary | ICD-10-CM | POA: Diagnosis not present

## 2018-12-31 DIAGNOSIS — Z5181 Encounter for therapeutic drug level monitoring: Secondary | ICD-10-CM | POA: Diagnosis not present

## 2018-12-31 MED ORDER — FUROSEMIDE 40 MG PO TABS: 40.0000 mg | ORAL_TABLET | Freq: Every day | ORAL | 11 refills | Status: DC

## 2018-12-31 NOTE — Telephone Encounter (Signed)
-----   Message from Rise Mu, PA-C sent at 12/31/2018  9:23 AM EDT ----- Renal function is stable, though remains above her baseline.  Potassium is at goal.  Blood count is low, though stable.  She has already been evaluated by the [facility] for her INR of 11.7.  Given renal function, we will defer transition to [facility] at this time.  Please have patient decrease Lasix to 40 mg daily.  Recheck BMP at her appt on 10/1.

## 2018-12-31 NOTE — Telephone Encounter (Signed)
Call to patient to review lab work.   She verbalized understanding and will continue POC for cv test and DCCV this Friday.   Pt will dec lasix effective immediately and [facility].   Orders placed.   Advised pt to call for any further questions or concerns.

## 2018-12-31 NOTE — Telephone Encounter (Signed)
No answer. Left message to call back.  It has been almost a week now. Routing to [place] for further advice if necessary or to stick with plan.

## 2019-01-01 ENCOUNTER — Ambulatory Visit (INDEPENDENT_AMBULATORY_CARE_PROVIDER_SITE_OTHER): Payer: Medicare Other

## 2019-01-01 ENCOUNTER — Ambulatory Visit (INDEPENDENT_AMBULATORY_CARE_PROVIDER_SITE_OTHER): Payer: Medicare Other | Admitting: Nurse Practitioner

## 2019-01-01 ENCOUNTER — Encounter (INDEPENDENT_AMBULATORY_CARE_PROVIDER_SITE_OTHER): Payer: Medicare Other

## 2019-01-01 ENCOUNTER — Other Ambulatory Visit: Payer: Self-pay

## 2019-01-01 DIAGNOSIS — I4891 Unspecified atrial fibrillation: Secondary | ICD-10-CM

## 2019-01-01 DIAGNOSIS — Z5181 Encounter for therapeutic drug level monitoring: Secondary | ICD-10-CM | POA: Diagnosis not present

## 2019-01-01 LAB — POCT INR: INR: 3 (ref 2.0–3.0)

## 2019-01-01 LAB — SARS CORONAVIRUS 2 (TAT 6-24 HRS): SARS Coronavirus 2: NEGATIVE

## 2019-01-01 NOTE — Patient Instructions (Signed)
Please resume warfarin, take 1.5 tablets tonight, then START NEW DOSAGE of 1 tablet every day. Recheck on Monday, 9/28.

## 2019-01-01 NOTE — Progress Notes (Signed)
Pt's INR elevated on Monday @ 11.7, pt was advised at that time to hold warfarin and take 10 of the 200 mcg OTC Vit K tablets. Pt misunderstood instructions and took 10 tablets on Monday night and then again on Tuesday night.  She also had a large salad on both days.  Pt's INR reading today is 3.0. Pt is sched for DCCV this Friday, 9/25 - she would like to proceed if possible since she had her COVID test and has made arrangements for transportation. Dr. Fletcher Anon is pt's cardiologist and was going to make decision on whether to proceed w/ DCCV, but he is out of the office today. Dr. Rockey Situ will be performing MD on Friday, but he is out of the office, as well. Spoke w/ Dr. Saunders Revel, DOD for today and he is agreeable to proceeding w/ DCCV if pt's INR is checked prior to procedure that am. Spoke w/ pharmacist, Marcelle Overlie and she is agreeable to having pt take 1.5 tablets today and then reducing dosage by 26% to 1 tablet every day since she is on amiodarone.  Pt is agreeable to this and appreciative that she may get to proceed w/ DCCV.

## 2019-01-02 NOTE — Telephone Encounter (Signed)
Please send unable to reach letter

## 2019-01-03 ENCOUNTER — Encounter: Payer: Self-pay | Admitting: *Deleted

## 2019-01-03 ENCOUNTER — Other Ambulatory Visit: Payer: Self-pay | Admitting: Cardiovascular Disease

## 2019-01-03 ENCOUNTER — Ambulatory Visit: Payer: Medicare Other | Admitting: Anesthesiology

## 2019-01-03 ENCOUNTER — Encounter
Admission: RE | Disposition: A | Discharge status: Home / Self Care | Payer: Self-pay | Source: Home / Self Care | Attending: Cardiovascular Disease

## 2019-01-03 ENCOUNTER — Other Ambulatory Visit: Payer: Self-pay

## 2019-01-03 ENCOUNTER — Ambulatory Visit
Admission: RE | Admit: 2019-01-03 | Discharge: 2019-01-03 | Disposition: A | Discharge status: Home / Self Care | Payer: Medicare Other | Attending: Cardiovascular Disease | Admitting: Cardiovascular Disease

## 2019-01-03 DIAGNOSIS — I2510 Atherosclerotic heart disease of native coronary artery without angina pectoris: Secondary | ICD-10-CM | POA: Diagnosis not present

## 2019-01-03 DIAGNOSIS — Z8673 Personal history of transient ischemic attack (TIA), and cerebral infarction without residual deficits: Secondary | ICD-10-CM | POA: Diagnosis not present

## 2019-01-03 DIAGNOSIS — E039 Hypothyroidism, unspecified: Secondary | ICD-10-CM | POA: Diagnosis not present

## 2019-01-03 DIAGNOSIS — Z79890 Hormone replacement therapy (postmenopausal): Secondary | ICD-10-CM | POA: Insufficient documentation

## 2019-01-03 DIAGNOSIS — I4819 Other persistent atrial fibrillation: Secondary | ICD-10-CM | POA: Insufficient documentation

## 2019-01-03 DIAGNOSIS — I130 Hypertensive heart and chronic kidney disease with heart failure and stage 1 through stage 4 chronic kidney disease, or unspecified chronic kidney disease: Secondary | ICD-10-CM | POA: Insufficient documentation

## 2019-01-03 DIAGNOSIS — Z7982 Long term (current) use of aspirin: Secondary | ICD-10-CM | POA: Insufficient documentation

## 2019-01-03 DIAGNOSIS — N182 Chronic kidney disease, stage 2 (mild): Secondary | ICD-10-CM | POA: Insufficient documentation

## 2019-01-03 DIAGNOSIS — Z6839 Body mass index (BMI) 39.0-39.9, adult: Secondary | ICD-10-CM | POA: Insufficient documentation

## 2019-01-03 DIAGNOSIS — E785 Hyperlipidemia, unspecified: Secondary | ICD-10-CM | POA: Diagnosis not present

## 2019-01-03 DIAGNOSIS — Z87891 Personal history of nicotine dependence: Secondary | ICD-10-CM | POA: Insufficient documentation

## 2019-01-03 DIAGNOSIS — I255 Ischemic cardiomyopathy: Secondary | ICD-10-CM | POA: Insufficient documentation

## 2019-01-03 DIAGNOSIS — I480 Paroxysmal atrial fibrillation: Secondary | ICD-10-CM | POA: Diagnosis not present

## 2019-01-03 DIAGNOSIS — Z7901 Long term (current) use of anticoagulants: Secondary | ICD-10-CM | POA: Diagnosis not present

## 2019-01-03 DIAGNOSIS — Z79899 Other long term (current) drug therapy: Secondary | ICD-10-CM | POA: Insufficient documentation

## 2019-01-03 DIAGNOSIS — N183 Chronic kidney disease, stage 3 (moderate): Secondary | ICD-10-CM | POA: Diagnosis not present

## 2019-01-03 DIAGNOSIS — E1122 Type 2 diabetes mellitus with diabetic chronic kidney disease: Secondary | ICD-10-CM | POA: Insufficient documentation

## 2019-01-03 DIAGNOSIS — R0602 Shortness of breath: Secondary | ICD-10-CM | POA: Diagnosis not present

## 2019-01-03 DIAGNOSIS — I5023 Acute on chronic systolic (congestive) heart failure: Secondary | ICD-10-CM | POA: Insufficient documentation

## 2019-01-03 HISTORY — PX: CARDIOVERSION: SHX1299

## 2019-01-03 LAB — GLUCOSE, CAPILLARY: Glucose-Capillary: 62 mg/dL — ABNORMAL LOW (ref 70–99)

## 2019-01-03 SURGERY — CARDIOVERSION
Anesthesia: General

## 2019-01-03 MED ORDER — PROPOFOL 10 MG/ML IV BOLUS
INTRAVENOUS | Status: DC | PRN
  Administered 2019-01-03: 40 mg via INTRAVENOUS
  Administered 2019-01-03: 10 mg via INTRAVENOUS

## 2019-01-03 MED ORDER — SODIUM CHLORIDE 0.9 % IV SOLN
INTRAVENOUS | Status: DC | PRN
  Administered 2019-01-03: 07:00:00 via INTRAVENOUS

## 2019-01-03 NOTE — CV Procedure (Signed)
Cardioversion procedure note For atrial fibrillation, persistent.  Procedure Details:  Consent: Risks of procedure as well as the alternatives and risks of each were explained to the (patient/caregiver). Consent for procedure obtained.  Time Out: Verified patient identification, verified procedure, site/side was marked, verified correct patient position, special equipment/implants available, medications/allergies/relevent history reviewed, required imaging and test results available. Performed  Patient placed on cardiac monitor, pulse oximetry, supplemental oxygen as necessary.  Sedation given: propofol IV, Dr. Kephart Pacer pads placed anterior and posterior chest.   Cardioverted 1 time(s).  Cardioverted at  150 J. Synchronized biphasic Converted to NSR   Evaluation: Findings: Post procedure EKG shows: NSR Complications: None Patient did tolerate procedure well.  Time Spent Directly with the Patient:  45 minutes   Tim Gollan, M.D., Ph.D. 

## 2019-01-03 NOTE — Anesthesia Post-op Follow-up Note (Signed)
Anesthesia QCDR form completed.        

## 2019-01-03 NOTE — Anesthesia Procedure Notes (Signed)
Date/Time: 01/03/2019 7:35 AM Performed by: Allean Found, CRNA Pre-anesthesia Checklist: Patient identified, Emergency Drugs available, Patient being monitored, Timeout performed and Suction available Oxygen Delivery Method: Nasal cannula Placement Confirmation: positive ETCO2

## 2019-01-03 NOTE — Progress Notes (Signed)
Spoke with Dr Rockey Situ on behalf of patient's medication questions and vital signs. Ordered to continue current medications.

## 2019-01-03 NOTE — Anesthesia Preprocedure Evaluation (Addendum)
Anesthesia Evaluation  Patient identified by MRN, date of birth, ID band Patient awake    Reviewed: Allergy & Precautions, NPO status , Patient's Chart, lab work & pertinent test results  History of Anesthesia Complications Negative for: history of anesthetic complications  Airway Mallampati: III       Dental   Pulmonary neg sleep apnea, neg COPD, Not current smoker, former smoker,           Cardiovascular hypertension, Pt. on medications +CHF  + dysrhythmias Atrial Fibrillation (-) Valvular Problems/Murmurs     Neuro/Psych neg Seizures CVA (R sided weakness, resolved)    GI/Hepatic Neg liver ROS, neg GERD  ,  Endo/Other  diabetes, Type 2, Oral Hypoglycemic Agents  Renal/GU Renal InsufficiencyRenal disease     Musculoskeletal   Abdominal   Peds  Hematology   Anesthesia Other Findings   Reproductive/Obstetrics                            Anesthesia Physical Anesthesia Plan  ASA: III  Anesthesia Plan: General   Post-op Pain Management:    Induction: Intravenous  PONV Risk Score and Plan: Propofol infusion, TIVA and Treatment may vary due to age or medical condition  Airway Management Planned: Nasal Cannula  Additional Equipment:   Intra-op Plan:   Post-operative Plan:   Informed Consent: I have reviewed the patients History and Physical, chart, labs and discussed the procedure including the risks, benefits and alternatives for the proposed anesthesia with the patient or authorized representative who has indicated his/her understanding and acceptance.       Plan Discussed with:   Anesthesia Plan Comments:         Anesthesia Quick Evaluation

## 2019-01-03 NOTE — H&P (Signed)
H&P Addendum, pre-cardioversion for atrial fibrillation  Patient was seen and evaluated prior to -cardioversion procedure Symptoms, prior testing details again confirmed with the patient Patient examined, no significant change from prior exam Lab work reviewed in detail personally by myself Patient understands risk and benefit of the procedure, willing to proceed  Signed, Esmond Plants, MD, Ph.D [facility]

## 2019-01-03 NOTE — Anesthesia Postprocedure Evaluation (Signed)
Anesthesia Post Note  Patient: Meghan [name]  Procedure(s) Performed: CARDIOVERSION (N/A )  Patient location during evaluation: Specials Recovery Anesthesia Type: General Level of consciousness: awake and alert Pain management: pain level controlled Vital Signs Assessment: post-procedure vital signs reviewed and stable Respiratory status: spontaneous breathing and respiratory function stable Cardiovascular status: stable Anesthetic complications: no     Last Vitals:  Vitals:   01/03/19 0749 01/03/19 0750  BP:    Pulse: (!) 52 (!) 53  Resp: (!) 22 19  Temp:    SpO2: 98% 97%    Last Pain:  Vitals:   01/03/19 0648  PainSc: 0-No pain                 Autry Droege K

## 2019-01-03 NOTE — Transfer of Care (Signed)
Immediate Anesthesia Transfer of Care Note  Patient: Meghan [name]  Procedure(s) Performed: CARDIOVERSION (N/A )  Patient Location: PACU  Anesthesia Type:General  Level of Consciousness: awake and alert   Airway & Oxygen Therapy: Patient Spontanous Breathing and Patient connected to nasal cannula oxygen  Post-op Assessment: Report given to RN and Post -op Vital signs reviewed and stable  Post vital signs: Reviewed and stable  Last Vitals:  Vitals Value Taken Time  BP    Temp    Pulse    Resp    SpO2      Last Pain:  Vitals:   01/03/19 0648  PainSc: 0-No pain         Complications: No apparent anesthesia complications

## 2019-01-03 NOTE — Telephone Encounter (Signed)
See 9/22 phone note. Patient had DCCV today.

## 2019-01-04 ENCOUNTER — Other Ambulatory Visit: Payer: Self-pay | Admitting: Cardiovascular Disease

## 2019-01-06 ENCOUNTER — Other Ambulatory Visit: Payer: Self-pay | Admitting: *Deleted

## 2019-01-06 MED ORDER — CARVEDILOL 6.25 MG PO TABS: ORAL_TABLET | ORAL | 0 refills | Status: DC

## 2019-01-08 ENCOUNTER — Ambulatory Visit (INDEPENDENT_AMBULATORY_CARE_PROVIDER_SITE_OTHER): Payer: Medicare Other

## 2019-01-08 ENCOUNTER — Other Ambulatory Visit: Payer: Self-pay

## 2019-01-08 DIAGNOSIS — Z5181 Encounter for therapeutic drug level monitoring: Secondary | ICD-10-CM | POA: Diagnosis not present

## 2019-01-08 DIAGNOSIS — I4891 Unspecified atrial fibrillation: Secondary | ICD-10-CM

## 2019-01-08 LAB — POCT INR: INR: 6.3 — AB (ref 2.0–3.0)

## 2019-01-08 NOTE — Patient Instructions (Signed)
Please skip warfarin for 4 days, then on Sunday START NEW DOSAGE of 1/2 tablet every day EXCEPT for 1 whole tablet on MONDAYS, [place].  Recheck in 12 days.

## 2019-01-09 ENCOUNTER — Ambulatory Visit (INDEPENDENT_AMBULATORY_CARE_PROVIDER_SITE_OTHER): Payer: Medicare Other | Admitting: Cardiovascular Disease

## 2019-01-09 ENCOUNTER — Encounter: Payer: Self-pay | Admitting: Cardiovascular Disease

## 2019-01-09 VITALS — BP 100/60 | HR 91 | Ht 67.0 in

## 2019-01-09 DIAGNOSIS — E785 Hyperlipidemia, unspecified: Secondary | ICD-10-CM

## 2019-01-09 DIAGNOSIS — I2510 Atherosclerotic heart disease of native coronary artery without angina pectoris: Secondary | ICD-10-CM

## 2019-01-09 DIAGNOSIS — I10 Essential (primary) hypertension: Secondary | ICD-10-CM | POA: Diagnosis not present

## 2019-01-09 DIAGNOSIS — I4819 Other persistent atrial fibrillation: Secondary | ICD-10-CM | POA: Diagnosis not present

## 2019-01-09 DIAGNOSIS — I5023 Acute on chronic systolic (congestive) heart failure: Secondary | ICD-10-CM

## 2019-01-09 DIAGNOSIS — I6522 Occlusion and stenosis of left carotid artery: Secondary | ICD-10-CM

## 2019-01-09 MED ORDER — TORSEMIDE 20 MG PO TABS: 40.0000 mg | ORAL_TABLET | Freq: Two times a day (BID) | ORAL | 4 refills | Status: DC

## 2019-01-09 NOTE — Progress Notes (Signed)
Cardiology Office Note   Date:  01/09/2019   ID:  Meghan [name], DOB [birth year], MRN [id number]  PCP:  [name], MD  Cardiologist:   Kathlyn Sacramento, MD  Endocrinologist: Dr. Atha Starks  Chief Complaint  Patient presents with  . other    2 week s/p cardioversion. Meds reviewed by the pt. verbally. Pt. c/o shortness of breath.       History of Present Illness: Meghan [name] is a 68 y.o. female who presents for a followup visit regarding chronic systolic heart failure, coronary artery disease and persistent atrial fibrillation on amiodarone. Other medical problems include hypertension, diabetes, anemia, chronic kidney disease, obesity, hyperlipidemia and carotid artery disease status post left carotid endarterectomy with previous CVA. She had worsening heart failure in 2018 in the setting of atrial fibrillation with rapid ventricular response.  She was treated with amiodarone with restoration of sinus rhythm. Most recent echocardiogram in August, 2018 showed severely reduced LV systolic function with an EF of 20-25%, mild mitral regurgitation, moderate to severe tricuspid regurgitation and moderate pulmonary hypertension with systolic pulmonary pressure around 50 mmHg. Right and left cardiac catheterization done in December 2018 showed significant one-vessel coronary artery disease with subtotal occlusion of the ostial and mid segment of the LAD with faint left to left collaterals.  There was mild to moderate left circumflex and RCA disease.  Right heart catheterization showed moderately elevated filling pressure with a wedge pressure of 23 mmHg, moderate pulmonary hypertension and mildly reduced cardiac output. She had significant hyperkalemia in the setting of renal failure and spironolactone was discontinued.    She was hospitalized earlier this month with mental status change and hypoglycemia.  She was noted to be in A. fib with RVR.  Echo showed an EF of 25 to 30%, moderate  pulmonary hypertension with mildly reduced RV function with severely dilated left atrium.  She had worsening renal function with diuresis.  Delene Loll was held at discharge.  She had recent issues with elevated INR and difficulty regulating her anticoagulation.  She underwent recent cardioversion to sinus rhythm but she is noted to be back in atrial fibrillation today.  She reports continuous weight gain and severe leg edema and abdominal swelling in addition to worsening shortness of breath.  She has been taking furosemide 40 mg twice daily but reports no good response to this.  Past Medical History:  Diagnosis Date  . Carotid arterial disease ([place])    a. 02/2012 s/p L CEA.  . CKD stage G3b/A1, GFR 30-44 and albumin creatinine ratio <30 mg/g   . Coronary artery disease    a. 02/2012 Lexi MV: Anterior, anteroseptal, septal, apical infarct with mild peri-infarct ischemia, EF 41%- medically managed due to anemia; b. 03/2017 Cath: LM nl, LAD 99ost/p/m - faint L->L collaterals, RI 20ost, LCX 61m, RCA 20p, 69m-->Med Rx for chronic subtotal LAD dzs. Avoid R Radial access in future.  . 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 Hyperlipidemia   . Hypertension   . Ischemic cardiomyopathy    a. 06/2013 Echo: EF 35-40%, glob HK, mildly dil LA, mild LVH, mild TR, mild to mod MR; b. 07/2016 Echo: EF 25-30%, sev mid-apicalanteroseptal, ant, apical HK, mild MR, sev dil LA, mod dil RA, mod TR, PASP 55mmHg; c. 11/2016 Echo: EF 20-25%, antsept DK, Gr2 DD, mild MR, mod dil LA/RV/RA, mod red RV fxn, mod-sev TR, PASP 40-56mmHg.  . Morbid obesity ([place])   . Normocytic anemia   .  Persistent atrial fibrillation (HCC)    a. CHA2DS2VASc = 7-->coumadin; maintaining sinus on amio.  . Stroke [facility]) 2013    Past Surgical History:  Procedure Laterality Date  . AMPUTATION TOE Left 09/22/2016   Procedure: AMPUTATION TOE;  Surgeon: Albertine Patricia, DPM;  Location: [facility] ORS;  Service: Podiatry;  Laterality: Left;   . CARDIOVERSION N/A 07/28/2016   Procedure: CARDIOVERSION;  Surgeon: Wellington Hampshire, MD;  Location: [facility] ORS;  Service: Cardiovascular;  Laterality: N/A;  . CARDIOVERSION N/A 08/07/2016   Procedure: CARDIOVERSION;  Surgeon: Wellington Hampshire, MD;  Location: [facility] ORS;  Service: Cardiovascular;  Laterality: N/A;  . CARDIOVERSION N/A 01/03/2019   Procedure: CARDIOVERSION;  Surgeon: Minna Merritts, MD;  Location: [facility] ORS;  Service: Cardiovascular;  Laterality: N/A;  . CAROTID ENDARTERECTOMY     [facility]; Dr. Lucky Cowboy  . CHOLECYSTECTOMY    . foot infection Right   . LEG SURGERY     right;infection  . RIGHT/LEFT HEART CATH AND CORONARY ANGIOGRAPHY Bilateral 03/12/2017   Procedure: RIGHT/LEFT HEART CATH AND CORONARY ANGIOGRAPHY;  Surgeon: Wellington Hampshire, MD;  Location: [facility];  Service: Cardiovascular;  Laterality: Bilateral;  . TONSILLECTOMY       Current Outpatient Medications  Medication Sig Dispense Refill  . amiodarone (PACERONE) 200 MG tablet Take 200 mg by mouth 2 (two) times daily.    Marland Kitchen atorvastatin (LIPITOR) 40 MG tablet Take 1 tablet by mouth once daily (Patient taking differently: Take 40 mg by mouth daily. ) 90 tablet 0  . carvedilol (COREG) 6.25 MG tablet TAKE 1 TABLET BY MOUTH TWICE DAILY WITH A MEAL 180 tablet 0  . furosemide (LASIX) 40 MG tablet Take 1 tablet (40 mg total) by mouth daily. (Patient taking differently: Take 40 mg by mouth 2 (two) times daily. ) 30 tablet 11  . glipiZIDE (GLUCOTROL) 10 MG tablet Take 10 mg by mouth 2 (two) times daily. Dr Honor Junes    . Insulin Degludec 200 UNIT/ML SOPN Inject 25 Units into the skin daily at 12 noon. Dr Honor Junes (1300)    . levothyroxine (SYNTHROID) 50 MCG tablet Take 50 mcg by mouth daily before breakfast.    . losartan (COZAAR) 25 MG tablet Take 1 tablet (25 mg total) by mouth daily. 30 tablet 11  . triamcinolone cream (KENALOG) 0.5 % Apply 1 application topically 2 (two) times daily as needed (psoriasis).     .  warfarin (COUMADIN) 5 MG tablet USE AS DIRECTED BY COUMADIN CLINIC FOR 30 DAYS (Patient not taking: Take one 5 mg tablet Sunday, Tuesday, Wednesday, Thursday, and Saturday. And one and one-half tablets 7.5 mg on Monday and Friday) 45 tablet 3   No current facility-administered medications for this visit.     Allergies:   Patient has no known allergies.    Social History:  The patient  reports that she has quit smoking. Her smoking use included cigarettes. She has a 2.50 pack-year smoking history. She has never used smokeless tobacco. She reports that she does not drink alcohol or use drugs.   Family History:  The patient's family history includes Diabetes in her maternal grandmother and mother; Heart disease in her father and mother; Hypertension in her father.    ROS:  Please see the history of present illness.   Otherwise, review of systems are positive for none.   All other systems are reviewed and negative.    PHYSICAL EXAM: VS:  BP 100/60 (BP Location: Left Arm, Patient Position: Sitting, Cuff Size:  Normal)   Pulse 91   Ht 5\' 7"  (1.702 m)   BMI 39.16 kg/m  , BMI Body mass index is 39.16 kg/m. GEN: Well nourished, well developed, in no acute distress  HEENT: normal  Neck: Jugular venous pressure is not well visualized, carotid bruits, or masses Cardiac: Irregularly irregular; no murmurs, rubs, or gallops, severe bilateral leg edema Respiratory: Few bibasilar crackles, normal work of breathing GI: soft, nontender, severe abdominal distention likely due to fluids, + BS MS: no deformity or atrophy  Skin: warm and dry, no rash Neuro:  Strength and sensation are intact Psych: euthymic mood, full affect   EKG:  EKG is ordered today. The ekg ordered today demonstrates atrial fibrillation with ventricular rate of 91 bpm.  Old anteroseptal infarct.  Recent Labs: 12/13/2018: B Natriuretic Peptide 1,269.0 12/14/2018: Magnesium 2.1; TSH 3.975 12/30/2018: BUN 55; Creatinine, Ser 2.39;  Hemoglobin 10.3; Platelets 193; Potassium 4.3; Sodium 135    Lipid Panel    Component Value Date/Time   CHOL 67 (L) 12/27/2016 0826   CHOL 165 02/14/2012 0410   TRIG 45 12/27/2016 0826   TRIG 168 02/14/2012 0410   HDL 23 (L) 12/27/2016 0826   HDL 31 (L) 02/14/2012 0410   CHOLHDL 2.9 12/27/2016 0826   VLDL 34 02/14/2012 0410   LDLCALC 35 12/27/2016 0826   LDLCALC 100 02/14/2012 0410      Wt Readings from Last 3 Encounters:  01/03/19 250 lb (113.4 kg)  12/15/18 294 lb 14.4 oz (133.8 kg)  09/24/18 269 lb (122 kg)       ASSESSMENT AND PLAN:  1.  Acute on chronic systolic heart failure: She seems to be severely volume overloaded mostly with right-sided heart failure.  She has not been responding very well to furosemide.  Underlying chronic kidney disease is likely contributing to poor response to diuresis.  I elected to switch her from furosemide to torsemide 40 mg twice daily.  Check basic metabolic profile in 1 week.  Continue small dose carvedilol and losartan.  If renal function worsens with diuresis or if she does not respond to oral diuretics, we might have to admit her and consider short-term inotropic therapy.  I suspect that diuresis is going to be difficult in the future given underlying chronic kidney disease.  2. Persistent atrial fibrillation: She had recent cardioversion but she is noted to be back in atrial fibrillation.  I suspect that the significant volume overload is contributing to difficulty maintaining in sinus rhythm in spite of amiodarone.  In addition, her left atrium was severely dilated and she might not maintain sinus rhythm long-term.  For now, continue amiodarone and will reconsider cardioversion after volume overload is improved.   3. Essential hypertension: Blood pressure is running low.  Continue losartan with no plans to switch to [facility] at the present time for that reason.  4. Coronary artery disease involving native coronary arteries with other  forms of angina: Subtotal occlusion of the LAD with collaterals.  Continue medical therapy.  5. Hyperlipidemia: Continue atorvastatin 40 mg daily.  6.  Recent hypothyroidism.  Currently on levothyroxine.  Most recent TSH was normal.     Disposition:   FU with me in 2 weeks.  Signed,  Kathlyn Sacramento, MD  01/09/2019 1:30 PM    [facility]

## 2019-01-09 NOTE — Patient Instructions (Signed)
Medication Instructions:  Your physician has recommended you make the following change in your medication:  1- STOP Furosemide. 2- START Torsemide 40 mg (2 tablets) by mouth two times a day.  If you need a refill on your cardiac medications before your next appointment, please call your pharmacy.   Lab work: Your physician recommends that you return for lab work in: 1 week at the [facility] (01/17/2019). BMET - Please go to the [facility]. You will check in at the front desk to the right as you walk into the atrium. Valet Parking is offered if needed.   If you have labs (blood work) drawn today and your tests are completely normal, you will receive your results only by: Marland Kitchen MyChart Message (if you have MyChart) OR . A paper copy in the mail If you have any lab test that is abnormal or we need to change your treatment, we will call you to review the results.  Testing/Procedures: NONE  Follow-Up: At [facility], you and your health needs are our priority.  As part of our continuing mission to provide you with exceptional heart care, we have created designated Provider Care Teams.  These Care Teams include your primary Cardiologist (physician) and Advanced Practice Providers (APPs -  Physician Assistants and Nurse Practitioners) who all work together to provide you with the care you need, when you need it. You will need a follow up appointment in 2 weeks.  Please call our office 2 months in advance to schedule this appointment.  You may see Kathlyn Sacramento, MD or one of the following Advanced Practice Providers on your designated Care Team:   Murray Hodgkins, NP Christell Faith, PA-C . Marrianne Mood, PA-C

## 2019-01-16 DIAGNOSIS — E039 Hypothyroidism, unspecified: Secondary | ICD-10-CM | POA: Diagnosis present

## 2019-01-16 DIAGNOSIS — N179 Acute kidney failure, unspecified: Secondary | ICD-10-CM | POA: Diagnosis present

## 2019-01-16 DIAGNOSIS — Z87891 Personal history of nicotine dependence: Secondary | ICD-10-CM

## 2019-01-16 DIAGNOSIS — K5900 Constipation, unspecified: Secondary | ICD-10-CM | POA: Diagnosis present

## 2019-01-16 DIAGNOSIS — R0602 Shortness of breath: Secondary | ICD-10-CM | POA: Diagnosis not present

## 2019-01-16 DIAGNOSIS — Z7901 Long term (current) use of anticoagulants: Secondary | ICD-10-CM

## 2019-01-16 DIAGNOSIS — Z8249 Family history of ischemic heart disease and other diseases of the circulatory system: Secondary | ICD-10-CM

## 2019-01-16 DIAGNOSIS — N189 Chronic kidney disease, unspecified: Secondary | ICD-10-CM | POA: Diagnosis not present

## 2019-01-16 DIAGNOSIS — J9601 Acute respiratory failure with hypoxia: Secondary | ICD-10-CM | POA: Diagnosis not present

## 2019-01-16 DIAGNOSIS — Z794 Long term (current) use of insulin: Secondary | ICD-10-CM

## 2019-01-16 DIAGNOSIS — I4819 Other persistent atrial fibrillation: Secondary | ICD-10-CM | POA: Diagnosis present

## 2019-01-16 DIAGNOSIS — Z9089 Acquired absence of other organs: Secondary | ICD-10-CM

## 2019-01-16 DIAGNOSIS — Z79890 Hormone replacement therapy (postmenopausal): Secondary | ICD-10-CM

## 2019-01-16 DIAGNOSIS — I2510 Atherosclerotic heart disease of native coronary artery without angina pectoris: Secondary | ICD-10-CM | POA: Diagnosis present

## 2019-01-16 DIAGNOSIS — Z79899 Other long term (current) drug therapy: Secondary | ICD-10-CM

## 2019-01-16 DIAGNOSIS — E1142 Type 2 diabetes mellitus with diabetic polyneuropathy: Secondary | ICD-10-CM | POA: Diagnosis present

## 2019-01-16 DIAGNOSIS — D62 Acute posthemorrhagic anemia: Secondary | ICD-10-CM | POA: Diagnosis present

## 2019-01-16 DIAGNOSIS — E785 Hyperlipidemia, unspecified: Secondary | ICD-10-CM | POA: Diagnosis present

## 2019-01-16 DIAGNOSIS — Z888 Allergy status to other drugs, medicaments and biological substances status: Secondary | ICD-10-CM

## 2019-01-16 DIAGNOSIS — Z5309 Procedure and treatment not carried out because of other contraindication: Secondary | ICD-10-CM | POA: Diagnosis not present

## 2019-01-16 DIAGNOSIS — I872 Venous insufficiency (chronic) (peripheral): Secondary | ICD-10-CM | POA: Diagnosis present

## 2019-01-16 DIAGNOSIS — E876 Hypokalemia: Secondary | ICD-10-CM | POA: Diagnosis not present

## 2019-01-16 DIAGNOSIS — N390 Urinary tract infection, site not specified: Secondary | ICD-10-CM | POA: Diagnosis present

## 2019-01-16 DIAGNOSIS — I071 Rheumatic tricuspid insufficiency: Secondary | ICD-10-CM | POA: Diagnosis present

## 2019-01-16 DIAGNOSIS — I255 Ischemic cardiomyopathy: Secondary | ICD-10-CM | POA: Diagnosis present

## 2019-01-16 DIAGNOSIS — I130 Hypertensive heart and chronic kidney disease with heart failure and stage 1 through stage 4 chronic kidney disease, or unspecified chronic kidney disease: Secondary | ICD-10-CM | POA: Diagnosis not present

## 2019-01-16 DIAGNOSIS — Z8673 Personal history of transient ischemic attack (TIA), and cerebral infarction without residual deficits: Secondary | ICD-10-CM

## 2019-01-16 DIAGNOSIS — I5043 Acute on chronic combined systolic (congestive) and diastolic (congestive) heart failure: Secondary | ICD-10-CM | POA: Diagnosis present

## 2019-01-16 DIAGNOSIS — E1165 Type 2 diabetes mellitus with hyperglycemia: Secondary | ICD-10-CM | POA: Diagnosis not present

## 2019-01-16 DIAGNOSIS — E871 Hypo-osmolality and hyponatremia: Secondary | ICD-10-CM | POA: Diagnosis present

## 2019-01-16 DIAGNOSIS — Z9114 Patient's other noncompliance with medication regimen: Secondary | ICD-10-CM

## 2019-01-16 DIAGNOSIS — Z6841 Body Mass Index (BMI) 40.0 and over, adult: Secondary | ICD-10-CM

## 2019-01-16 DIAGNOSIS — Z833 Family history of diabetes mellitus: Secondary | ICD-10-CM

## 2019-01-16 DIAGNOSIS — Z20828 Contact with and (suspected) exposure to other viral communicable diseases: Secondary | ICD-10-CM | POA: Diagnosis present

## 2019-01-16 DIAGNOSIS — Z9111 Patient's noncompliance with dietary regimen: Secondary | ICD-10-CM

## 2019-01-16 DIAGNOSIS — R188 Other ascites: Secondary | ICD-10-CM | POA: Diagnosis present

## 2019-01-16 DIAGNOSIS — Z9049 Acquired absence of other specified parts of digestive tract: Secondary | ICD-10-CM

## 2019-01-16 DIAGNOSIS — Z7401 Bed confinement status: Secondary | ICD-10-CM

## 2019-01-16 DIAGNOSIS — N1832 Chronic kidney disease, stage 3b: Secondary | ICD-10-CM | POA: Diagnosis present

## 2019-01-16 DIAGNOSIS — R570 Cardiogenic shock: Secondary | ICD-10-CM | POA: Diagnosis not present

## 2019-01-16 DIAGNOSIS — B9620 Unspecified Escherichia coli [E. coli] as the cause of diseases classified elsewhere: Secondary | ICD-10-CM | POA: Diagnosis present

## 2019-01-16 DIAGNOSIS — Z89412 Acquired absence of left great toe: Secondary | ICD-10-CM

## 2019-01-16 DIAGNOSIS — E11649 Type 2 diabetes mellitus with hypoglycemia without coma: Secondary | ICD-10-CM | POA: Diagnosis not present

## 2019-01-16 DIAGNOSIS — I4892 Unspecified atrial flutter: Secondary | ICD-10-CM | POA: Diagnosis not present

## 2019-01-16 DIAGNOSIS — I272 Pulmonary hypertension, unspecified: Secondary | ICD-10-CM | POA: Diagnosis present

## 2019-01-16 DIAGNOSIS — E1169 Type 2 diabetes mellitus with other specified complication: Secondary | ICD-10-CM | POA: Diagnosis present

## 2019-01-16 DIAGNOSIS — I509 Heart failure, unspecified: Secondary | ICD-10-CM | POA: Diagnosis not present

## 2019-01-16 DIAGNOSIS — R791 Abnormal coagulation profile: Secondary | ICD-10-CM | POA: Diagnosis present

## 2019-01-16 DIAGNOSIS — B850 Pediculosis due to Pediculus humanus capitis: Secondary | ICD-10-CM | POA: Diagnosis present

## 2019-01-16 DIAGNOSIS — M5136 Other intervertebral disc degeneration, lumbar region: Secondary | ICD-10-CM | POA: Diagnosis present

## 2019-01-16 DIAGNOSIS — E1122 Type 2 diabetes mellitus with diabetic chronic kidney disease: Secondary | ICD-10-CM | POA: Diagnosis present

## 2019-01-16 DIAGNOSIS — K921 Melena: Secondary | ICD-10-CM | POA: Diagnosis present

## 2019-01-17 ENCOUNTER — Inpatient Hospital Stay: Payer: Medicare Other

## 2019-01-17 ENCOUNTER — Emergency Department: Payer: Medicare Other

## 2019-01-17 ENCOUNTER — Other Ambulatory Visit: Payer: Self-pay

## 2019-01-17 ENCOUNTER — Inpatient Hospital Stay
Admission: EM | Admit: 2019-01-17 | Discharge: 2019-02-08 | DRG: 291 | Disposition: A | Discharge status: Home Health | Payer: Medicare Other | Attending: Internal Medicine | Admitting: Internal Medicine

## 2019-01-17 ENCOUNTER — Encounter: Payer: Self-pay | Admitting: Emergency Medicine

## 2019-01-17 DIAGNOSIS — I4819 Other persistent atrial fibrillation: Secondary | ICD-10-CM

## 2019-01-17 DIAGNOSIS — E1142 Type 2 diabetes mellitus with diabetic polyneuropathy: Secondary | ICD-10-CM | POA: Diagnosis present

## 2019-01-17 DIAGNOSIS — D62 Acute posthemorrhagic anemia: Secondary | ICD-10-CM | POA: Diagnosis present

## 2019-01-17 DIAGNOSIS — N189 Chronic kidney disease, unspecified: Secondary | ICD-10-CM | POA: Diagnosis not present

## 2019-01-17 DIAGNOSIS — R109 Unspecified abdominal pain: Secondary | ICD-10-CM | POA: Diagnosis not present

## 2019-01-17 DIAGNOSIS — I509 Heart failure, unspecified: Secondary | ICD-10-CM | POA: Diagnosis not present

## 2019-01-17 DIAGNOSIS — I5043 Acute on chronic combined systolic (congestive) and diastolic (congestive) heart failure: Secondary | ICD-10-CM

## 2019-01-17 DIAGNOSIS — N183 Chronic kidney disease, stage 3 unspecified: Secondary | ICD-10-CM | POA: Diagnosis present

## 2019-01-17 DIAGNOSIS — I2510 Atherosclerotic heart disease of native coronary artery without angina pectoris: Secondary | ICD-10-CM | POA: Diagnosis present

## 2019-01-17 DIAGNOSIS — J811 Chronic pulmonary edema: Secondary | ICD-10-CM | POA: Diagnosis not present

## 2019-01-17 DIAGNOSIS — J984 Other disorders of lung: Secondary | ICD-10-CM

## 2019-01-17 DIAGNOSIS — N390 Urinary tract infection, site not specified: Secondary | ICD-10-CM | POA: Diagnosis present

## 2019-01-17 DIAGNOSIS — I959 Hypotension, unspecified: Secondary | ICD-10-CM | POA: Diagnosis not present

## 2019-01-17 DIAGNOSIS — I482 Chronic atrial fibrillation, unspecified: Secondary | ICD-10-CM

## 2019-01-17 DIAGNOSIS — Z7901 Long term (current) use of anticoagulants: Secondary | ICD-10-CM | POA: Diagnosis not present

## 2019-01-17 DIAGNOSIS — K921 Melena: Secondary | ICD-10-CM | POA: Diagnosis present

## 2019-01-17 DIAGNOSIS — I255 Ischemic cardiomyopathy: Secondary | ICD-10-CM | POA: Diagnosis present

## 2019-01-17 DIAGNOSIS — I480 Paroxysmal atrial fibrillation: Secondary | ICD-10-CM | POA: Diagnosis not present

## 2019-01-17 DIAGNOSIS — E876 Hypokalemia: Secondary | ICD-10-CM | POA: Diagnosis not present

## 2019-01-17 DIAGNOSIS — I071 Rheumatic tricuspid insufficiency: Secondary | ICD-10-CM | POA: Diagnosis present

## 2019-01-17 DIAGNOSIS — Z539 Procedure and treatment not carried out, unspecified reason: Secondary | ICD-10-CM | POA: Diagnosis not present

## 2019-01-17 DIAGNOSIS — E1122 Type 2 diabetes mellitus with diabetic chronic kidney disease: Secondary | ICD-10-CM | POA: Diagnosis present

## 2019-01-17 DIAGNOSIS — I130 Hypertensive heart and chronic kidney disease with heart failure and stage 1 through stage 4 chronic kidney disease, or unspecified chronic kidney disease: Secondary | ICD-10-CM | POA: Diagnosis present

## 2019-01-17 DIAGNOSIS — R188 Other ascites: Secondary | ICD-10-CM | POA: Diagnosis present

## 2019-01-17 DIAGNOSIS — I5022 Chronic systolic (congestive) heart failure: Secondary | ICD-10-CM | POA: Diagnosis not present

## 2019-01-17 DIAGNOSIS — N179 Acute kidney failure, unspecified: Secondary | ICD-10-CM | POA: Diagnosis present

## 2019-01-17 DIAGNOSIS — R609 Edema, unspecified: Secondary | ICD-10-CM

## 2019-01-17 DIAGNOSIS — R570 Cardiogenic shock: Secondary | ICD-10-CM | POA: Diagnosis present

## 2019-01-17 DIAGNOSIS — R809 Proteinuria, unspecified: Secondary | ICD-10-CM | POA: Diagnosis not present

## 2019-01-17 DIAGNOSIS — I272 Pulmonary hypertension, unspecified: Secondary | ICD-10-CM | POA: Diagnosis present

## 2019-01-17 DIAGNOSIS — R0602 Shortness of breath: Secondary | ICD-10-CM | POA: Diagnosis not present

## 2019-01-17 DIAGNOSIS — N1832 Chronic kidney disease, stage 3b: Secondary | ICD-10-CM | POA: Diagnosis not present

## 2019-01-17 DIAGNOSIS — D649 Anemia, unspecified: Secondary | ICD-10-CM | POA: Diagnosis not present

## 2019-01-17 DIAGNOSIS — Z20828 Contact with and (suspected) exposure to other viral communicable diseases: Secondary | ICD-10-CM | POA: Diagnosis present

## 2019-01-17 DIAGNOSIS — I5023 Acute on chronic systolic (congestive) heart failure: Secondary | ICD-10-CM

## 2019-01-17 DIAGNOSIS — I5021 Acute systolic (congestive) heart failure: Secondary | ICD-10-CM | POA: Diagnosis not present

## 2019-01-17 DIAGNOSIS — K625 Hemorrhage of anus and rectum: Secondary | ICD-10-CM

## 2019-01-17 DIAGNOSIS — R601 Generalized edema: Secondary | ICD-10-CM | POA: Diagnosis not present

## 2019-01-17 DIAGNOSIS — R0989 Other specified symptoms and signs involving the circulatory and respiratory systems: Secondary | ICD-10-CM | POA: Diagnosis not present

## 2019-01-17 DIAGNOSIS — R890 Abnormal level of enzymes in specimens from other organs, systems and tissues: Secondary | ICD-10-CM | POA: Diagnosis not present

## 2019-01-17 DIAGNOSIS — N049 Nephrotic syndrome with unspecified morphologic changes: Secondary | ICD-10-CM | POA: Diagnosis not present

## 2019-01-17 DIAGNOSIS — R918 Other nonspecific abnormal finding of lung field: Secondary | ICD-10-CM | POA: Diagnosis not present

## 2019-01-17 DIAGNOSIS — M549 Dorsalgia, unspecified: Secondary | ICD-10-CM

## 2019-01-17 DIAGNOSIS — D631 Anemia in chronic kidney disease: Secondary | ICD-10-CM | POA: Diagnosis not present

## 2019-01-17 DIAGNOSIS — I4821 Permanent atrial fibrillation: Secondary | ICD-10-CM | POA: Diagnosis not present

## 2019-01-17 DIAGNOSIS — I25118 Atherosclerotic heart disease of native coronary artery with other forms of angina pectoris: Secondary | ICD-10-CM | POA: Diagnosis not present

## 2019-01-17 DIAGNOSIS — J9601 Acute respiratory failure with hypoxia: Secondary | ICD-10-CM | POA: Diagnosis present

## 2019-01-17 DIAGNOSIS — K922 Gastrointestinal hemorrhage, unspecified: Secondary | ICD-10-CM | POA: Diagnosis not present

## 2019-01-17 DIAGNOSIS — I4892 Unspecified atrial flutter: Secondary | ICD-10-CM | POA: Diagnosis not present

## 2019-01-17 DIAGNOSIS — I129 Hypertensive chronic kidney disease with stage 1 through stage 4 chronic kidney disease, or unspecified chronic kidney disease: Secondary | ICD-10-CM | POA: Diagnosis not present

## 2019-01-17 DIAGNOSIS — E870 Hyperosmolality and hypernatremia: Secondary | ICD-10-CM | POA: Diagnosis not present

## 2019-01-17 DIAGNOSIS — Z5309 Procedure and treatment not carried out because of other contraindication: Secondary | ICD-10-CM | POA: Diagnosis not present

## 2019-01-17 DIAGNOSIS — E11649 Type 2 diabetes mellitus with hypoglycemia without coma: Secondary | ICD-10-CM | POA: Diagnosis not present

## 2019-01-17 DIAGNOSIS — R600 Localized edema: Secondary | ICD-10-CM | POA: Diagnosis not present

## 2019-01-17 DIAGNOSIS — E871 Hypo-osmolality and hyponatremia: Secondary | ICD-10-CM | POA: Diagnosis present

## 2019-01-17 DIAGNOSIS — I4891 Unspecified atrial fibrillation: Secondary | ICD-10-CM | POA: Diagnosis not present

## 2019-01-17 DIAGNOSIS — E1169 Type 2 diabetes mellitus with other specified complication: Secondary | ICD-10-CM | POA: Diagnosis present

## 2019-01-17 DIAGNOSIS — N184 Chronic kidney disease, stage 4 (severe): Secondary | ICD-10-CM | POA: Diagnosis present

## 2019-01-17 DIAGNOSIS — N1831 Chronic kidney disease, stage 3a: Secondary | ICD-10-CM | POA: Diagnosis not present

## 2019-01-17 DIAGNOSIS — Z6841 Body Mass Index (BMI) 40.0 and over, adult: Secondary | ICD-10-CM | POA: Diagnosis not present

## 2019-01-17 LAB — CBC WITH DIFFERENTIAL/PLATELET
Abs Immature Granulocytes: 0.1 10*3/uL — ABNORMAL HIGH (ref 0.00–0.07)
Basophils Absolute: 0.1 10*3/uL (ref 0.0–0.1)
Basophils Relative: 1 %
Eosinophils Absolute: 0.4 10*3/uL (ref 0.0–0.5)
Eosinophils Relative: 6 %
HCT: 21.7 % — ABNORMAL LOW (ref 36.0–46.0)
Hemoglobin: 6.7 g/dL — ABNORMAL LOW (ref 12.0–15.0)
Immature Granulocytes: 2 %
Lymphocytes Relative: 7 %
Lymphs Abs: 0.5 10*3/uL — ABNORMAL LOW (ref 0.7–4.0)
MCH: 25.3 pg — ABNORMAL LOW (ref 26.0–34.0)
MCHC: 30.9 g/dL (ref 30.0–36.0)
MCV: 81.9 fL (ref 80.0–100.0)
Monocytes Absolute: 0.9 10*3/uL (ref 0.1–1.0)
Monocytes Relative: 14 %
Neutro Abs: 4.7 10*3/uL (ref 1.7–7.7)
Neutrophils Relative %: 70 %
Platelets: 193 10*3/uL (ref 150–400)
RBC: 2.65 MIL/uL — ABNORMAL LOW (ref 3.87–5.11)
RDW: 18.9 % — ABNORMAL HIGH (ref 11.5–15.5)
WBC: 6.6 10*3/uL (ref 4.0–10.5)
nRBC: 0 % (ref 0.0–0.2)

## 2019-01-17 LAB — URINALYSIS, COMPLETE (UACMP) WITH MICROSCOPIC
Bilirubin Urine: NEGATIVE
Glucose, UA: NEGATIVE mg/dL
Ketones, ur: NEGATIVE mg/dL
Nitrite: NEGATIVE
Protein, ur: NEGATIVE mg/dL
Specific Gravity, Urine: 1.009 (ref 1.005–1.030)
pH: 5 (ref 5.0–8.0)

## 2019-01-17 LAB — COMPREHENSIVE METABOLIC PANEL
ALT: 23 U/L (ref 0–44)
AST: 42 U/L — ABNORMAL HIGH (ref 15–41)
Albumin: 2.5 g/dL — ABNORMAL LOW (ref 3.5–5.0)
Alkaline Phosphatase: 103 U/L (ref 38–126)
Anion gap: 9 (ref 5–15)
BUN: 74 mg/dL — ABNORMAL HIGH (ref 8–23)
CO2: 24 mmol/L (ref 22–32)
Calcium: 7.7 mg/dL — ABNORMAL LOW (ref 8.9–10.3)
Chloride: 100 mmol/L (ref 98–111)
Creatinine, Ser: 3.82 mg/dL — ABNORMAL HIGH (ref 0.44–1.00)
GFR calc Af Amer: 13 mL/min — ABNORMAL LOW (ref >60)
GFR calc non Af Amer: 11 mL/min — ABNORMAL LOW (ref >60)
Glucose, Bld: 119 mg/dL — ABNORMAL HIGH (ref 70–99)
Potassium: 5 mmol/L (ref 3.5–5.1)
Sodium: 133 mmol/L — ABNORMAL LOW (ref 135–145)
Total Bilirubin: 1.3 mg/dL — ABNORMAL HIGH (ref 0.3–1.2)
Total Protein: 7.7 g/dL (ref 6.5–8.1)

## 2019-01-17 LAB — HEPATIC FUNCTION PANEL
ALT: 23 U/L (ref 0–44)
AST: 42 U/L — ABNORMAL HIGH (ref 15–41)
Albumin: 2.2 g/dL — ABNORMAL LOW (ref 3.5–5.0)
Alkaline Phosphatase: 79 U/L (ref 38–126)
Bilirubin, Direct: 0.4 mg/dL — ABNORMAL HIGH (ref 0.0–0.2)
Indirect Bilirubin: 0.8 mg/dL (ref 0.3–0.9)
Total Bilirubin: 1.2 mg/dL (ref 0.3–1.2)
Total Protein: 7.1 g/dL (ref 6.5–8.1)

## 2019-01-17 LAB — SARS CORONAVIRUS 2 BY RT PCR (HOSPITAL ORDER, PERFORMED IN ~~LOC~~ HOSPITAL LAB): SARS Coronavirus 2: NEGATIVE

## 2019-01-17 LAB — IRON AND TIBC
Iron: 27 ug/dL — ABNORMAL LOW (ref 28–170)
Saturation Ratios: 8 % — ABNORMAL LOW (ref 10.4–31.8)
TIBC: 362 ug/dL (ref 250–450)
UIBC: 335 ug/dL

## 2019-01-17 LAB — HEMOGLOBIN AND HEMATOCRIT, BLOOD
HCT: 22.1 % — ABNORMAL LOW (ref 36.0–46.0)
HCT: 22.8 % — ABNORMAL LOW (ref 36.0–46.0)
Hemoglobin: 6.8 g/dL — ABNORMAL LOW (ref 12.0–15.0)
Hemoglobin: 7.3 g/dL — ABNORMAL LOW (ref 12.0–15.0)

## 2019-01-17 LAB — PROTIME-INR
INR: 2.2 — ABNORMAL HIGH (ref 0.8–1.2)
INR: 2.2 — ABNORMAL HIGH (ref 0.8–1.2)
Prothrombin Time: 24.3 seconds — ABNORMAL HIGH (ref 11.4–15.2)
Prothrombin Time: 24.4 seconds — ABNORMAL HIGH (ref 11.4–15.2)

## 2019-01-17 LAB — PROTEIN / CREATININE RATIO, URINE
Creatinine, Urine: 40 mg/dL
Protein Creatinine Ratio: 0.23 mg/mg{Cre} — ABNORMAL HIGH (ref 0.00–0.15)
Total Protein, Urine: 9 mg/dL

## 2019-01-17 LAB — LIPASE, BLOOD: Lipase: 15 U/L (ref 11–51)

## 2019-01-17 LAB — PREPARE RBC (CROSSMATCH)

## 2019-01-17 LAB — BRAIN NATRIURETIC PEPTIDE: B Natriuretic Peptide: 790 pg/mL — ABNORMAL HIGH (ref 0.0–100.0)

## 2019-01-17 LAB — GLUCOSE, CAPILLARY
Glucose-Capillary: 121 mg/dL — ABNORMAL HIGH (ref 70–99)
Glucose-Capillary: 38 mg/dL — CL (ref 70–99)
Glucose-Capillary: 53 mg/dL — ABNORMAL LOW (ref 70–99)
Glucose-Capillary: 62 mg/dL — ABNORMAL LOW (ref 70–99)
Glucose-Capillary: 69 mg/dL — ABNORMAL LOW (ref 70–99)
Glucose-Capillary: 71 mg/dL (ref 70–99)
Glucose-Capillary: 78 mg/dL (ref 70–99)

## 2019-01-17 LAB — FERRITIN: Ferritin: 44 ng/mL (ref 11–307)

## 2019-01-17 LAB — ABO/RH: ABO/RH(D): O POS

## 2019-01-17 LAB — AMYLASE: Amylase: 30 U/L (ref 28–100)

## 2019-01-17 LAB — MRSA PCR SCREENING: MRSA by PCR: NEGATIVE

## 2019-01-17 LAB — TRANSFERRIN: Transferrin: 275 mg/dL (ref 192–382)

## 2019-01-17 LAB — TROPONIN I (HIGH SENSITIVITY): Troponin I (High Sensitivity): 13 ng/L (ref <18)

## 2019-01-17 MED ORDER — SODIUM CHLORIDE 0.9 % IV SOLN
1.0000 g | INTRAVENOUS | Status: DC
  Administered 2019-01-17 – 2019-01-21 (×5): 1 g via INTRAVENOUS
  Filled 2019-01-17 (×3): qty 1
  Filled 2019-01-17: qty 10
  Filled 2019-01-17: qty 1

## 2019-01-17 MED ORDER — LEVOTHYROXINE SODIUM 50 MCG PO TABS
50.0000 ug | ORAL_TABLET | Freq: Every day | ORAL | Status: DC
  Administered 2019-01-17 – 2019-02-08 (×22): 50 ug via ORAL
  Filled 2019-01-17 (×20): qty 1

## 2019-01-17 MED ORDER — DEXTROSE 50 % IV SOLN
25.0000 g | INTRAVENOUS | Status: AC
  Administered 2019-01-17: 16:00:00 25 g via INTRAVENOUS

## 2019-01-17 MED ORDER — MORPHINE SULFATE (PF) 4 MG/ML IV SOLN: 4.0000 mg | Freq: Once | INTRAVENOUS | Status: DC

## 2019-01-17 MED ORDER — CHLORHEXIDINE GLUCONATE CLOTH 2 % EX PADS: 6.0000 | MEDICATED_PAD | Freq: Every day | CUTANEOUS | Status: DC

## 2019-01-17 MED ORDER — PANTOPRAZOLE SODIUM 40 MG IV SOLR
40.0000 mg | Freq: Two times a day (BID) | INTRAVENOUS | Status: DC
  Administered 2019-01-20 – 2019-02-08 (×37): 40 mg via INTRAVENOUS
  Filled 2019-01-17 (×38): qty 40

## 2019-01-17 MED ORDER — SODIUM CHLORIDE 0.9% FLUSH
3.0000 mL | INTRAVENOUS | Status: DC | PRN
  Administered 2019-02-05: 23:00:00 3 mL via INTRAVENOUS
  Filled 2019-01-17: qty 3

## 2019-01-17 MED ORDER — SODIUM CHLORIDE 0.9% IV SOLUTION: Freq: Once | INTRAVENOUS | Status: DC

## 2019-01-17 MED ORDER — SODIUM CHLORIDE 0.9 % IV SOLN
10.0000 mL/h | Freq: Once | INTRAVENOUS | Status: AC
  Administered 2019-01-17: 10 mL/h via INTRAVENOUS

## 2019-01-17 MED ORDER — SODIUM CHLORIDE 0.9 % IV SOLN
80.0000 mg | Freq: Once | INTRAVENOUS | Status: AC
  Administered 2019-01-17: 80 mg via INTRAVENOUS
  Filled 2019-01-17: qty 80

## 2019-01-17 MED ORDER — MORPHINE SULFATE (PF) 2 MG/ML IV SOLN
2.0000 mg | Freq: Once | INTRAVENOUS | Status: AC
  Administered 2019-01-17: 2 mg via INTRAVENOUS
  Filled 2019-01-17: qty 1

## 2019-01-17 MED ORDER — FUROSEMIDE 10 MG/ML IJ SOLN
40.0000 mg | Freq: Two times a day (BID) | INTRAMUSCULAR | Status: DC
  Administered 2019-01-17 – 2019-01-19 (×4): 40 mg via INTRAVENOUS
  Filled 2019-01-17 (×4): qty 4

## 2019-01-17 MED ORDER — SODIUM CHLORIDE 0.9 % IV SOLN
250.0000 mL | INTRAVENOUS | Status: DC | PRN
  Administered 2019-01-25: 08:00:00 via INTRAVENOUS

## 2019-01-17 MED ORDER — INSULIN ASPART 100 UNIT/ML ~~LOC~~ SOLN
0.0000 [IU] | Freq: Every day | SUBCUTANEOUS | Status: DC
  Administered 2019-01-19 – 2019-01-20 (×3): 2 [IU] via SUBCUTANEOUS
  Administered 2019-01-21: 22:00:00 4 [IU] via SUBCUTANEOUS
  Filled 2019-01-17 (×4): qty 1

## 2019-01-17 MED ORDER — ALBUMIN HUMAN 25 % IV SOLN
12.5000 g | Freq: Every day | INTRAVENOUS | Status: DC
  Administered 2019-01-18 – 2019-02-03 (×16): 12.5 g via INTRAVENOUS
  Filled 2019-01-17 (×18): qty 50

## 2019-01-17 MED ORDER — ONDANSETRON HCL 4 MG/2ML IJ SOLN: 4.0000 mg | Freq: Four times a day (QID) | INTRAMUSCULAR | Status: DC | PRN

## 2019-01-17 MED ORDER — ONDANSETRON HCL 4 MG/2ML IJ SOLN
4.0000 mg | INTRAMUSCULAR | Status: AC
  Administered 2019-01-17: 4 mg via INTRAVENOUS
  Filled 2019-01-17: qty 2

## 2019-01-17 MED ORDER — PHYTONADIONE 5 MG PO TABS
2.5000 mg | ORAL_TABLET | Freq: Once | ORAL | Status: DC
  Filled 2019-01-17: qty 1

## 2019-01-17 MED ORDER — ACETAMINOPHEN 325 MG PO TABS
650.0000 mg | ORAL_TABLET | ORAL | Status: DC | PRN
  Administered 2019-01-17 – 2019-02-02 (×4): 650 mg via ORAL
  Filled 2019-01-17 (×5): qty 2

## 2019-01-17 MED ORDER — ATORVASTATIN CALCIUM 20 MG PO TABS
40.0000 mg | ORAL_TABLET | Freq: Every day | ORAL | Status: DC
  Administered 2019-01-17 – 2019-02-07 (×22): 40 mg via ORAL
  Filled 2019-01-17 (×23): qty 2

## 2019-01-17 MED ORDER — MIDODRINE HCL 5 MG PO TABS: 10.0000 mg | ORAL_TABLET | Freq: Three times a day (TID) | ORAL | Status: DC

## 2019-01-17 MED ORDER — FUROSEMIDE 10 MG/ML IJ SOLN
40.0000 mg | Freq: Once | INTRAMUSCULAR | Status: AC
  Administered 2019-01-17: 40 mg via INTRAVENOUS
  Filled 2019-01-17: qty 4

## 2019-01-17 MED ORDER — INSULIN ASPART 100 UNIT/ML ~~LOC~~ SOLN
0.0000 [IU] | Freq: Three times a day (TID) | SUBCUTANEOUS | Status: DC
  Administered 2019-01-18 (×2): 2 [IU] via SUBCUTANEOUS
  Administered 2019-01-19 (×2): 3 [IU] via SUBCUTANEOUS
  Administered 2019-01-19: 1 [IU] via SUBCUTANEOUS
  Administered 2019-01-20: 17:00:00 3 [IU] via SUBCUTANEOUS
  Administered 2019-01-20: 08:00:00 1 [IU] via SUBCUTANEOUS
  Administered 2019-01-20: 5 [IU] via SUBCUTANEOUS
  Administered 2019-01-21: 2 [IU] via SUBCUTANEOUS
  Administered 2019-01-21 (×2): 3 [IU] via SUBCUTANEOUS
  Administered 2019-01-22: 7 [IU] via SUBCUTANEOUS
  Filled 2019-01-17 (×12): qty 1

## 2019-01-17 MED ORDER — MILRINONE LACTATE IN DEXTROSE 20-5 MG/100ML-% IV SOLN
0.1250 ug/kg/min | INTRAVENOUS | Status: DC
  Administered 2019-01-17 – 2019-01-22 (×6): 0.125 ug/kg/min via INTRAVENOUS
  Filled 2019-01-17 (×7): qty 100

## 2019-01-17 MED ORDER — DEXTROSE 50 % IV SOLN
INTRAVENOUS | Status: AC
  Administered 2019-01-17: 12:00:00 50 mL
  Filled 2019-01-17: qty 50

## 2019-01-17 MED ORDER — SODIUM CHLORIDE 0.9 % IV SOLN
8.0000 mg/h | INTRAVENOUS | Status: AC
  Administered 2019-01-17 – 2019-01-20 (×6): 8 mg/h via INTRAVENOUS
  Filled 2019-01-17 (×7): qty 80

## 2019-01-17 MED ORDER — AMIODARONE HCL 200 MG PO TABS
200.0000 mg | ORAL_TABLET | Freq: Two times a day (BID) | ORAL | Status: DC
  Administered 2019-01-17 – 2019-01-20 (×7): 200 mg via ORAL
  Filled 2019-01-17 (×7): qty 1

## 2019-01-17 MED ORDER — SODIUM CHLORIDE 0.9% FLUSH
3.0000 mL | Freq: Two times a day (BID) | INTRAVENOUS | Status: DC
  Administered 2019-01-17 – 2019-02-08 (×39): 3 mL via INTRAVENOUS

## 2019-01-17 MED ORDER — DEXTROSE 50 % IV SOLN
INTRAVENOUS | Status: AC
  Administered 2019-01-17: 25 g via INTRAVENOUS
  Filled 2019-01-17: qty 50

## 2019-01-17 NOTE — Consult Note (Signed)
Cardiology Consultation:   Patient ID: Meghan [name]; [id number]; April 30, 1950   Admit date: 01/17/2019 Date of Consult: 01/17/2019  Primary Care Provider: [name], MD Primary Cardiologist: Fletcher Anon   Patient Profile:   Meghan [name] is a 68 y.o. female with a hx of CAD as detailed below, HFrEF secondary to ICM with prior hyperkalemia with spironolactone in the setting of renal failure, persistent A. fib s/p recent DCCV on 01/03/2019 that was briefly successful on amiodarone and Coumadin, CVA, anemia, carotid artery disease s/p left-sided CEA, DM, HTN, HLD who is being seen today for the evaluation of CHF at the request of Rufina Falco, NP.  History of Present Illness:   Ms. Stepien had worsening heart failure in 2018 in the setting of Afib with RVR. With amiodarone she converted back to sinus rhythm. Echo from 11/2016 showed an EF of 20-25%, mild MR, moderate to severe TR, moderate pulmonary hypertension with a PASP around 50 mmHg. R/LHC in 03/2017 showed significant one-vessel CAD with subtotal occlusion of the ostial and mid segment of the LAD with faint left-to-left collaterals. There was mild to moderate LCx and RCA stenosis. RHC showed moderately elevated filling pressure with a wedge pressure of 23 mmHg, moderate pulmonary hypertension and mildly reduced cardiac output.   More recently, she was admitted in 12/2018 with AMS and hypoglycemia. She was noted to be in Afib with RVR. Echo showed an EF of 30-35%, moderate pulmonary hypertension with mildly reduced RV systolic function, and a severely dilated left atrium. With diuresis, she had worsening renal function leading to the holding of Entresto at discharge.  She is also recently had issues and difficulty regulating her anticoagulation with Coumadin with elevated INRs.  She underwent cardioversion on 01/03/2019 which was briefly successful though was noted to be back in A. fib at her follow-up visit on 01/09/2019.  At that visit, she noted  continuous weight gain and severe leg edema with shortness of breath.  She had been taking Lasix 40 mg twice daily though did not notice significant response to this.  At that visit, she was changed to torsemide 40 mg twice daily.  She was continued on low-dose carvedilol and losartan.  Patient's daughter indicates over the past week the patient has been more somnolent with continued lower extremity swelling, abdominal surgeon, and significant shortness of breath prompting her to be brought him to the ED earlier this morning where she was found to be significantly volume overloaded and anemic with a hemoglobin of 6.7.  BNP 790, INR 2.2, COVID-19 negative, initial high-sensitivity troponin 13.  BUN 74, serum creatinine 3.8 with a baseline approximately 1.7.  Albumin 2.5, potassium 5.0.  Due to hypotension and acute on chronic kidney disease her Coreg and losartan were held upon admission.  She was given 40 of IV Lasix in the ED with no documented urine output though RN has stated urine output has been vigorous.  In the setting of her hemoglobin dropping to 6.7 from a prior of 10.3 she received vitamin K as well as 1 unit of packed red blood cells.  Coumadin has been held.  BP remained soft in the 90s over 60s.  She has continued to complain of back pain requiring IV narcotics.    Past Medical History:  Diagnosis Date   Carotid arterial disease ([place])    a. 02/2012 s/p L CEA.   CKD stage G3b/A1, GFR 30-44 and albumin creatinine ratio <30 mg/g    Coronary artery disease  a. 02/2012 [facility] MV: Anterior, anteroseptal, septal, apical infarct with mild peri-infarct ischemia, EF 41%- medically managed due to anemia; b. 03/2017 Cath: LM nl, LAD 99ost/p/m - faint L->L collaterals, RI 20ost, LCX 22m, RCA 20p, 33m-->Med Rx for chronic subtotal LAD dzs. Avoid R Radial access in future.   Diabetes mellitus without complication ([place])    Type II   Hyperlipidemia    Hypertension    Ischemic cardiomyopathy     a. 06/2013 Echo: EF 35-40%, glob HK, mildly dil LA, mild LVH, mild TR, mild to mod MR; b. 07/2016 Echo: EF 25-30%, sev mid-apicalanteroseptal, ant, apical HK, mild MR, sev dil LA, mod dil RA, mod TR, PASP 60mmHg; c. 11/2016 Echo: EF 20-25%, antsept DK, Gr2 DD, mild MR, mod dil LA/RV/RA, mod red RV fxn, mod-sev TR, PASP 40-42mmHg.   Morbid obesity ([place])    Normocytic anemia    Persistent atrial fibrillation (HCC)    a. CHA2DS2VASc = 7-->coumadin; maintaining sinus on amio.   Stroke [facility]) 2013    Past Surgical History:  Procedure Laterality Date   AMPUTATION TOE Left 09/22/2016   Procedure: AMPUTATION TOE;  Surgeon: Albertine Patricia, DPM;  Location: [facility] ORS;  Service: Podiatry;  Laterality: Left;   CARDIOVERSION N/A 07/28/2016   Procedure: CARDIOVERSION;  Surgeon: Wellington Hampshire, MD;  Location: [facility] ORS;  Service: Cardiovascular;  Laterality: N/A;   CARDIOVERSION N/A 08/07/2016   Procedure: CARDIOVERSION;  Surgeon: Wellington Hampshire, MD;  Location: [facility] ORS;  Service: Cardiovascular;  Laterality: N/A;   CARDIOVERSION N/A 01/03/2019   Procedure: CARDIOVERSION;  Surgeon: Minna Merritts, MD;  Location: [facility] ORS;  Service: Cardiovascular;  Laterality: N/A;   CAROTID ENDARTERECTOMY     [facility]; Dr. Lucky Cowboy   CHOLECYSTECTOMY     foot infection Right    LEG SURGERY     right;infection   RIGHT/LEFT HEART CATH AND CORONARY ANGIOGRAPHY Bilateral 03/12/2017   Procedure: RIGHT/LEFT HEART CATH AND CORONARY ANGIOGRAPHY;  Surgeon: Wellington Hampshire, MD;  Location: [facility];  Service: Cardiovascular;  Laterality: Bilateral;   TONSILLECTOMY       Home Meds: Prior to Admission medications   Medication Sig Start Date End Date Taking? Authorizing Provider  amiodarone (PACERONE) 200 MG tablet Take 200 mg by mouth 2 (two) times daily.   Yes [provider]  atorvastatin (LIPITOR) 40 MG tablet Take 1 tablet by mouth once daily Patient taking differently: Take 40 mg by mouth daily.   11/25/18  Yes Wellington Hampshire, MD  carvedilol (COREG) 6.25 MG tablet TAKE 1 TABLET BY MOUTH TWICE DAILY WITH A MEAL 01/06/19  Yes Wellington Hampshire, MD  glipiZIDE (GLUCOTROL) 10 MG tablet Take 10 mg by mouth 2 (two) times daily. Dr Honor Junes 08/13/15  Yes [provider]  Insulin Degludec 200 UNIT/ML SOPN Inject 25 Units into the skin daily at 12 noon. Dr Honor Junes (1300) 02/18/18  Yes [provider]  levothyroxine (SYNTHROID) 50 MCG tablet Take 50 mcg by mouth daily before breakfast.   Yes [provider]  losartan (COZAAR) 25 MG tablet Take 1 tablet (25 mg total) by mouth daily. 12/15/18 12/15/19 Yes Mody, Ulice Bold, MD  torsemide (DEMADEX) 20 MG tablet Take 2 tablets (40 mg total) by mouth 2 (two) times daily. 01/09/19  Yes Wellington Hampshire, MD  triamcinolone cream (KENALOG) 0.5 % Apply 1 application topically 2 (two) times daily as needed (psoriasis).    Yes [provider]  warfarin (COUMADIN) 5 MG tablet USE AS DIRECTED  BY COUMADIN CLINIC FOR 30 DAYS Patient not taking: Take one 5 mg tablet Sunday, Tuesday, Wednesday, Thursday, and Saturday. And one and one-half tablets 7.5 mg on Monday and Friday 11/25/18   Wellington Hampshire, MD    Inpatient Medications: Scheduled Meds:  sodium chloride   Intravenous Once   amiodarone  200 mg Oral BID   atorvastatin  40 mg Oral Daily   Chlorhexidine Gluconate Cloth  6 each Topical Q0600   insulin aspart  0-5 Units Subcutaneous QHS   insulin aspart  0-9 Units Subcutaneous TID WC   levothyroxine  50 mcg Oral QAC breakfast   midodrine  10 mg Oral TID WC    morphine injection  4 mg Intravenous Once   [START ON 01/20/2019] pantoprazole  40 mg Intravenous Q12H   sodium chloride flush  3 mL Intravenous Q12H   Continuous Infusions:  sodium chloride Stopped (01/17/19 1010)   [START ON 01/18/2019] albumin human     cefTRIAXone (ROCEPHIN)  IV 1 g (01/17/19 0854)   pantoprozole (PROTONIX) infusion 8 mg/hr (01/17/19  0909)   PRN Meds: sodium chloride, acetaminophen, ondansetron (ZOFRAN) IV, sodium chloride flush  Allergies:  No Known Allergies  Social History:   Social History   Socioeconomic History   Marital status: Widowed    Spouse name: Not on file   Number of children: Not on file   Years of education: Not on file   Highest education level: Not on file  Occupational History   Not on file  Social Needs   Financial resource strain: Not on file   Food insecurity    Worry: Not on file    Inability: Not on file   Transportation needs    Medical: Not on file    Non-medical: Not on file  Tobacco Use   Smoking status: Former Smoker    Packs/day: 0.50    Years: 5.00    Pack years: 2.50    Types: Cigarettes   Smokeless tobacco: Never Used  Substance and Sexual Activity   Alcohol use: No   Drug use: No   Sexual activity: Not Currently  Lifestyle   Physical activity    Days per week: 0 days    Minutes per session: Not on file   Stress: Not on file  Relationships   Social connections    Talks on phone: Three times a week    Gets together: Not on file    Attends religious service: Not on file    Active member of club or organization: Not on file    Attends meetings of clubs or organizations: Not on file    Relationship status: Not on file   Intimate partner violence    Fear of current or ex partner: Not on file    Emotionally abused: Not on file    Physically abused: Not on file    Forced sexual activity: Not on file  Other Topics Concern   Not on file  Social History Narrative   Not on file     Family History:   Family History  Problem Relation Age of Onset   Heart disease Mother    Diabetes Mother    Heart disease Father    Hypertension Father    Diabetes Maternal Grandmother     ROS:  Review of Systems  Unable to perform ROS: Medical condition      Physical Exam/Data:   Vitals:   01/17/19 0833 01/17/19 0900 01/17/19 1000 01/17/19  1030  BP:  Marland Kitchen)  83/70 (!) 85/68 93/66  Pulse: 90 93 75 67  Resp: 14 16 14 13   Temp:      TempSrc:      SpO2: 96% 97% 97% 98%  Weight:      Height:        Intake/Output Summary (Last 24 hours) at 01/17/2019 1042 Last data filed at 01/17/2019 0800 Gross per 24 hour  Intake 360 ml  Output --  Net 360 ml   Filed Weights   01/17/19 0001 01/17/19 0439  Weight: 120.2 kg (!) 139 kg   Body mass index is 48 kg/m.   Physical Exam: General: Well developed, well nourished, in no acute distress, sleepy. Head: Normocephalic, atraumatic, sclera non-icteric, no xanthomas, nares without discharge.  Neck: Negative for carotid bruits. JVD not elevated. Lungs: Diminished breath sounds bilaterally with crackles along the bilateral bases. Breathing is unlabored. Heart: Irregularly irregular with S1 S2. No murmurs, rubs, or gallops appreciated. Abdomen: Soft, non-tender, distended with normoactive bowel sounds. No hepatomegaly. No rebound/guarding. No obvious abdominal masses. Msk:  Strength and tone appear normal for age. Extremities: No clubbing or cyanosis.  2-3+ lower extremity pitting edema extending up into the abdomen, with the left lower extremity being greater than the right. Distal pedal pulses are 2+ and equal bilaterally. Neuro: Somnolent. Psych: Somnolent.   EKG:  The EKG was personally reviewed and demonstrates: Afib, 75 bpm, low voltage QRS, poor R wave progression along the precordial leads, possible prior anterior infarct, nonspecific st/t changes Telemetry:  Telemetry was personally reviewed and demonstrates: A. fib, 60s bpm  Weights: Filed Weights   01/17/19 0001 01/17/19 0439  Weight: 120.2 kg (!) 139 kg    Relevant CV Studies: 2D Echo 12/2018: 1. The left ventricle has severely reduced systolic function, with an ejection fraction of 25-30%. The cavity size was mildly dilated. Left ventricular diastolic Doppler parameters are indeterminate. Left ventricular diffuse  hypokinesis.  2. The right ventricle has mildly reduced systolic function. The cavity was moderately enlarged. There is no increase in right ventricular wall thickness. Right ventricular systolic pressure is moderately elevated with an estimated pressure of 56.2  mmHg.  3. Left atrial size was severely dilated.  4. Right atrial size was moderately dilated.  5. Tricuspid valve regurgitation is moderate.  6. Rhythm is atrial fibrillation __________  [facility] 03/2017:  Ost LAD to Prox LAD lesion is 99% stenosed.  Prox LAD to Mid LAD lesion is 99% stenosed.  Ost Ramus lesion is 20% stenosed.  Mid Cx lesion is 40% stenosed.  Prox RCA to Mid RCA lesion is 20% stenosed.  Mid RCA to Dist RCA lesion is 40% stenosed.   1.  Significant severe one-vessel coronary artery disease with subtotal occlusion of the LAD at the ostium and mid segment with faint left to left collaterals.  Mild to moderate left circumflex and RCA disease. 2.  Left ventricular angiography was not performed.  Aortic valve could not be crossed due to severe tortuosity of the innominate artery 3.  Right heart catheterization showed moderately elevated filling pressures with a wedge pressure of 23 mmHg, moderate pulmonary hypertension with a pressure of 48 of 19 mmHg and mild reduced cardiac output at 4.71 with a cardiac index of 2.25.  Recommendations: The LAD territory has been infarcted for many years.  No revascularization is advised. Continue medical therapy for ischemic cardiomyopathy and chronic systolic heart failure. Resume warfarin tonight. Recheck renal function in few days given underlying chronic kidney disease. Avoid metformin due to  heart failure and chronic kidney disease.  I will forward this to primary care physician.   Avoid future catheterization via the right radial artery Due to severe tortuosity of the innominate artery.   Laboratory Data:  Chemistry Recent Labs  Lab 01/17/19 0011  NA 133*  K 5.0    CL 100  CO2 24  GLUCOSE 119*  BUN 74*  CREATININE 3.82*  CALCIUM 7.7*  GFRNONAA 11*  GFRAA 13*  ANIONGAP 9    Recent Labs  Lab 01/17/19 0011  PROT 7.7  ALBUMIN 2.5*  AST 42*  ALT 23  ALKPHOS 103  BILITOT 1.3*   Hematology Recent Labs  Lab 01/17/19 0011  WBC 6.6  RBC 2.65*  HGB 6.7*  HCT 21.7*  MCV 81.9  MCH 25.3*  MCHC 30.9  RDW 18.9*  PLT 193   Cardiac EnzymesNo results for input(s): TROPONINI in the last 168 hours. No results for input(s): TROPIPOC in the last 168 hours.  BNP Recent Labs  Lab 01/17/19 0011  BNP 790.0*    DDimer No results for input(s): DDIMER in the last 168 hours.  Radiology/Studies:  Ct Abdomen Pelvis Wo Contrast  Result Date: 01/17/2019 IMPRESSION: 1. Mild fat stranding around the pancreas, likely from generalized volume overload but please correlate with serum enzymes. 2. Small volume ascites accumulated around the liver. 3. Cardiomegaly and atherosclerosis. Electronically Signed   By: Monte Fantasia M.D.   On: 01/17/2019 04:38   Dg Chest 1 View  Result Date: 01/17/2019 IMPRESSION: Cardiomegaly with pulmonary vascular congestion and interstitial edema. Electronically Signed   By: Prudencio Pair M.D.   On: 01/17/2019 00:55    Assessment and Plan:   1.  Acute on chronic HFrEF secondary to ICM with cardiorenal syndrome: -Patient is massively volume overloaded -Clinically her picture is consistent with cardiorenal syndrome -After discussing case amongst cardiology, PCCM, and nephrology it has been decided to start the patient on low-dose milrinone to augment diuresis -Discontinue midodrine -Given her hypotension we will need to start at lower dose and may need to consider addition of norepinephrine as well -Start IV Lasix 40 mg twice daily -KCl repletion as indicated -Recent echo obtained 1 month prior, will repeat given clinical picture -In the setting of hypotension and acute on chronic kidney injury evidence-based heart failure  therapy has been held and will be resumed as tolerated based on clinical picture -Down the road following IV diuresis could consider right heart cath to better assess her volume status prior to discharge -In the setting of her somnolence recommend ABG to assess if BiPAP is needed.  However, nephrology and RN indicate patient was alert and oriented this morning eating breakfast.  She had recently been given IV morphine prior to cardiology evaluating which may be playing a role in her somnolence  2.  Persistent A. Fib: -Ventricular rate well controlled -Carvedilol on hold as above -Coumadin on hold secondary to anemia -Discontinue amiodarone in the setting of anticoagulation being held in an effort to prevent pharmacological cardioversion with downtrending INR  3.  GI bleed/acute blood loss anemia: -Coumadin on hold -Status post vitamin K and 1 unit packed red blood cell -Maintain hemoglobin greater than 8.0 -GI has been consulted  4.  Acute on CKD stage III: -Likely in setting of cardiorenal syndrome -Nephrology on board -Starting milrinone as above  5.  CAD: -No complaints of angina -Initial high-sensitivity troponin negative -Update echo as above -Currently, no plans for ischemic evaluation  6.  Lower extremity edema: -  Likely multifactorial including massive volume overload as well as third spacing from hypoalbuminemia and worsening anemia -Replete albumin as indicated -Transfuse as needed -Lower extremity Doppler is pending, low likelihood of DVT in the setting of chronic anticoagulation with prior therapeutic INR   For questions or updates, please contact [facility] Please consult [email or web address] for contact info under Cardiology/STEMI.   Signed, Christell Faith, PA-C [facility] Pager: [phone] 01/17/2019, 10:42 AM

## 2019-01-17 NOTE — Consult Note (Signed)
Jonathon Bellows , MD [address] [place], [place], [place], [address] [address], [place], [place], [place], [address] Phone: [phone]  Fax: [phone]  Consultation  Referring Provider:   Dr Darvin Neighbours Primary Care Physician:  [name], MD Primary Gastroenterologist:  None          Reason for Consultation:     GI bleed   Date of Admission:  01/17/2019 Date of Consultation:  01/17/2019         HPI:   Meghan [name] is a 68 y.o. female  Presented early this morning to the hospital with shortness of breath . Some history of rectal bleeding a few days prior.   Recent admision on 12/13/2018 for CHF. On admission noted to be in heart failure. She is on coumadin. INR was 6.3 9 days back and 11.7 2 weeks back . On admission was 2.2 .  BNP 790 , AKI with creatinine 3.82 . Hb 2 weeks back was 10.3 grams with MCV 83 and on admission was 6.7 with MCV of 81.   Patient is very sick in the ICU at this point of time.  Not able to get a good history.  Daughter was in the room and she mentioned that the patient has had rectal bleeding for a few days.  Discussed with Dr. Mortimer Fries no further rectal bleeding after hospitalization.  Patient family denies any NSAID use per the patient.   Past Medical History:  Diagnosis Date  . Carotid arterial disease ([place])    a. 02/2012 s/p L CEA.  . CKD stage G3b/A1, GFR 30-44 and albumin creatinine ratio <30 mg/g   . Coronary artery disease    a. 02/2012 [facility] MV: Anterior, anteroseptal, septal, apical infarct with mild peri-infarct ischemia, EF 41%- medically managed due to anemia; b. 03/2017 Cath: LM nl, LAD 99ost/p/m - faint L->L collaterals, RI 20ost, LCX 19m, RCA 20p, 11m-->Med Rx for chronic subtotal LAD dzs. Avoid R Radial access in future.  . 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 Hyperlipidemia   . Hypertension   . Ischemic cardiomyopathy    a. 06/2013 Echo: EF 35-40%, glob HK, mildly dil LA, mild LVH, mild TR, mild to mod MR; b.  07/2016 Echo: EF 25-30%, sev mid-apicalanteroseptal, ant, apical HK, mild MR, sev dil LA, mod dil RA, mod TR, PASP 80mmHg; c. 11/2016 Echo: EF 20-25%, antsept DK, Gr2 DD, mild MR, mod dil LA/RV/RA, mod red RV fxn, mod-sev TR, PASP 40-35mmHg.  . Morbid obesity ([facility])   . Normocytic anemia   . Persistent atrial fibrillation (HCC)    a. CHA2DS2VASc = 7-->coumadin; maintaining sinus on amio.  . Stroke [facility]) 2013    Past Surgical History:  Procedure Laterality Date  . AMPUTATION TOE Left 09/22/2016   Procedure: AMPUTATION TOE;  Surgeon: Albertine Patricia, DPM;  Location: [facility] ORS;  Service: Podiatry;  Laterality: Left;  . CARDIOVERSION N/A 07/28/2016   Procedure: CARDIOVERSION;  Surgeon: Wellington Hampshire, MD;  Location: [facility] ORS;  Service: Cardiovascular;  Laterality: N/A;  . CARDIOVERSION N/A 08/07/2016   Procedure: CARDIOVERSION;  Surgeon: Wellington Hampshire, MD;  Location: [facility] ORS;  Service: Cardiovascular;  Laterality: N/A;  . CARDIOVERSION N/A 01/03/2019   Procedure: CARDIOVERSION;  Surgeon: Minna Merritts, MD;  Location: [facility] ORS;  Service: Cardiovascular;  Laterality: N/A;  . CAROTID ENDARTERECTOMY     [facility]; Dr. Lucky Cowboy  . CHOLECYSTECTOMY    . foot infection Right   . LEG SURGERY  right;infection  . RIGHT/LEFT HEART CATH AND CORONARY ANGIOGRAPHY Bilateral 03/12/2017   Procedure: RIGHT/LEFT HEART CATH AND CORONARY ANGIOGRAPHY;  Surgeon: Wellington Hampshire, MD;  Location: [facility];  Service: Cardiovascular;  Laterality: Bilateral;  . TONSILLECTOMY      Prior to Admission medications   Medication Sig Start Date End Date Taking? Authorizing Provider  amiodarone (PACERONE) 200 MG tablet Take 200 mg by mouth 2 (two) times daily.   Yes [provider]  atorvastatin (LIPITOR) 40 MG tablet Take 1 tablet by mouth once daily Patient taking differently: Take 40 mg by mouth daily.  11/25/18  Yes Wellington Hampshire, MD  carvedilol (COREG) 6.25 MG tablet TAKE 1 TABLET BY MOUTH TWICE  DAILY WITH A MEAL 01/06/19  Yes Wellington Hampshire, MD  glipiZIDE (GLUCOTROL) 10 MG tablet Take 10 mg by mouth 2 (two) times daily. Dr Honor Junes 08/13/15  Yes [provider]  Insulin Degludec 200 UNIT/ML SOPN Inject 25 Units into the skin daily at 12 noon. Dr Honor Junes (1300) 02/18/18  Yes [provider]  levothyroxine (SYNTHROID) 50 MCG tablet Take 50 mcg by mouth daily before breakfast.   Yes [provider]  losartan (COZAAR) 25 MG tablet Take 1 tablet (25 mg total) by mouth daily. 12/15/18 12/15/19 Yes Mody, Ulice Bold, MD  torsemide (DEMADEX) 20 MG tablet Take 2 tablets (40 mg total) by mouth 2 (two) times daily. 01/09/19  Yes Wellington Hampshire, MD  triamcinolone cream (KENALOG) 0.5 % Apply 1 application topically 2 (two) times daily as needed (psoriasis).    Yes [provider]  warfarin (COUMADIN) 5 MG tablet USE AS DIRECTED BY COUMADIN CLINIC FOR 30 DAYS Patient not taking: Take one 5 mg tablet Sunday, Tuesday, Wednesday, Thursday, and Saturday. And one and one-half tablets 7.5 mg on Monday and Friday 11/25/18   Wellington Hampshire, MD    Family History  Problem Relation Age of Onset  . Heart disease Mother   . Diabetes Mother   . Heart disease Father   . Hypertension Father   . Diabetes Maternal Grandmother      Social History   Tobacco Use  . Smoking status: Former Smoker    Packs/day: 0.50    Years: 5.00    Pack years: 2.50    Types: Cigarettes  . Smokeless tobacco: Never Used  Substance Use Topics  . Alcohol use: No  . Drug use: No    Allergies as of 01/16/2019  . (No Known Allergies)    Review of Systems:    Unable to obtain   Physical Exam:  Vital signs in last 24 hours: Temp:  [96.4 F (35.8 C)-98 F (36.7 C)] 98 F (36.7 C) (10/09 0800) Pulse Rate:  [67-93] 67 (10/09 1030) Resp:  [13-23] 13 (10/09 1030) BP: (83-132)/(49-83) 93/66 (10/09 1030) SpO2:  [84 %-100 %] 98 % (10/09 1030) Weight:  [120.2 kg-139 kg] 139 kg (10/09 0439)  Last BM Date: 01/16/19 General:   Laying comfortably in bed arousable but unable to continue a conversation. Head:  Normocephalic and atraumatic. Eyes:   No icterus.   Conjunctiva pink. PERRLA. Ears: Not possible to assess Neck:  Supple; no masses or thyroidomegaly Lungs: Decreased air entry bilaterally Heart: JVD raised to the right earlobe regular rate and rhythm;  Without murmur, clicks, rubs or gallops Abdomen:  Soft, nondistended, nontender. Normal bowel sounds. No appreciable masses or hepatomegaly.  No rebound or guarding.  Neurologic:  Alert and oriented x0;   Skin:  Intact without significant lesions or rashes. Cervical Nodes:  No significant cervical adenopathy. Psych: Cannot assess Extremities bilateral pitting pedal edema  LAB RESULTS: Recent Labs    01/17/19 0011  WBC 6.6  HGB 6.7*  HCT 21.7*  PLT 193   BMET Recent Labs    01/17/19 0011  NA 133*  K 5.0  CL 100  CO2 24  GLUCOSE 119*  BUN 74*  CREATININE 3.82*  CALCIUM 7.7*   LFT Recent Labs    01/17/19 0011  PROT 7.7  ALBUMIN 2.5*  AST 42*  ALT 23  ALKPHOS 103  BILITOT 1.3*   PT/INR Recent Labs    01/17/19 0011  LABPROT 24.3*  INR 2.2*    STUDIES: Ct Abdomen Pelvis Wo Contrast  Result Date: 01/17/2019 CLINICAL DATA:  Right flank pain and leg weakness. EXAM: CT ABDOMEN AND PELVIS WITHOUT CONTRAST TECHNIQUE: Multidetector CT imaging of the abdomen and pelvis was performed following the standard protocol without IV contrast. COMPARISON:  None. FINDINGS: Lower chest:  Cardiomegaly with multifocal coronary atherosclerosis. Hepatobiliary: No focal liver abnormality.Cholecystectomy. No bile duct dilatation. Pancreas: Mild fat stranding around the head of pancreas and mid duodenum, but no duodenal wall thickening or clear pancreatic septal edema. Spleen: Unremarkable. Adrenals/Urinary Tract: Negative adrenals. No hydronephrosis or stone. Unremarkable bladder. Stomach/Bowel:  No obstruction. No evidence  of bowel inflammation. Vascular/Lymphatic: No acute vascular abnormality. Scattered atherosclerotic calcifications. No mass or adenopathy. Reproductive:Pelvic floor laxity.  Question pelvic sling surgery. Other: Small volume perihepatic ascites.  Prominent anasarca Musculoskeletal: Severe and diffuse lumbar spine degeneration with L4-5 anterolisthesis. Bilateral degenerative hip narrowing. IMPRESSION: 1. Mild fat stranding around the pancreas, likely from generalized volume overload but please correlate with serum enzymes. 2. Small volume ascites accumulated around the liver. 3. Cardiomegaly and atherosclerosis. Electronically Signed   By: Monte Fantasia M.D.   On: 01/17/2019 04:38   Dg Chest 1 View  Result Date: 01/17/2019 CLINICAL DATA:  Shortness of breath EXAM: CHEST  1 VIEW COMPARISON:  December 13, 2018 FINDINGS: There is stable cardiomegaly. Pulmonary vascular congestion and mild increased interstitial markings throughout both lungs. No focal airspace consolidation. Bilateral shoulder arthritis is seen. IMPRESSION: Cardiomegaly with pulmonary vascular congestion and interstitial edema. Electronically Signed   By: Prudencio Pair M.D.   On: 01/17/2019 00:55      Impression / Plan:   Meghan [name] is a 68 y.o. y/o female admitted with CHF, on coumadin , supratherapeutic INR last 2 weeks , some history of rectal bleeding last few days but none since admission.. I have been consulted for a GI bleed. She has a history of microcytic anemia which is worse on this admission .  Likely longstanding chronic blood loss with acute exacerbation secondary to supratherapeutic INR.   Plan  1. Iron studies , b12, folate 2. Monitor CBC and transfuse as needed 3. When INR < 1.5 and CHF is treated and safer for anesthesia can consider EGD+colonoscopy . At this time unsafe for any sedation . Suggest tagged scan if has active bleeding .  If stable during hospitalization endoscopy can also be done as an outpatient.      Thank you for involving me in the care of this patient.      LOS: 0 days   Jonathon Bellows, MD  01/17/2019, 1:01 PM

## 2019-01-17 NOTE — Progress Notes (Signed)
PT Cancellation Note  Patient Details Name: Meghan [name] MRN: [id number] DOB: 1950/12/26   Cancelled Treatment:    Reason Eval/Treat Not Completed: Medical issues which prohibited therapy(Consult received, chart reviewed. Pt noted with Hgb of 6.7, pending blood transfusion this am. Also with pending venous ultrasound of LLE. Will hold PT evaluation at this time and re-attempt at later date/time as pt is medically appropriate.)   Everlean Alstrom. Graylon Good, PT, DPT 01/17/19, 8:20 AM

## 2019-01-17 NOTE — Progress Notes (Signed)
OT Cancellation Note  Patient Details Name: Meghan [name] MRN: [id number] DOB: December 14, 1950   Cancelled Treatment:    Reason Eval/Treat Not Completed: Medical issues which prohibited therapy. Consult received, chart reviewed. Pt noted with Hgb of 6.7, pending blood transfusion this am. Also with pending venous ultrasound of LLE. Will hold OT evaluation at this time and re-attempt at later date/time as pt is medically appropriate.   Jeni Salles, MPH, MS, OTR/L ascom [phone] 01/17/19, 8:17 AM

## 2019-01-17 NOTE — ED Triage Notes (Addendum)
Pt to triage via w/c, mask in place with no distress noted; st [place] with rt sided back pain and leg weakness but denies any recent illness; st [facility] CHF

## 2019-01-17 NOTE — H&P (Addendum)
[facility] NAME: Meghan [name]    MR#:  [id number]  DATE OF BIRTH:  [birth year]  DATE OF ADMISSION:  01/17/2019  PRIMARY CARE PHYSICIAN: [name], MD   REQUESTING/REFERRING PHYSICIAN: Hinda Kehr, MD  CHIEF COMPLAINT:   Chief Complaint  Patient presents with   Shortness of Breath    HISTORY OF PRESENT ILLNESS:  68 y.o. female with pertinent past medical history of chronic systolic heart failure CAD, persistent atrial fibrillation s/p cardioversion on Coumadin, diabetes mellitus, chronic anemia, CKD, hyperlipidemia, carotid artery disease s/p left carotid endarterectomy with previous CVA, and hypertension into the ED with chief complaints of right flank pain, shortness of breath and rectal bleed.  Patient report onset of right flank pain since yesterday evening and has been progressive without associated symptoms.  Prior to her symptom, she noted mild to moderate episodes of rectal bleed.  She reports onset of bleeding x3 days associated with nausea and vomiting.  Denies abdominal pain, diarrhea, fevers or chills, chest pain, diaphoresis, or any other GI related symptoms.   On review of her chart, she was recently hospitalized on 12/13/2018 following admission for acute metabolic encephalopathy secondary to hypoglycemia and hypoxia from CHF.  During that admission she was noted to be in A. fib with RVR.  Echo showed EF of 25 to 30%.  She underwent recent cardioversion to sinus rhythm but noted to be back in atrial fibrillation. Per patient's daughter who is currently at the bedside, patient's " has not been the same since discharge".  She reported patient is continued to gain weight with severe leg edema and abdominal swelling worsening her breathing.  Delene Loll was held at discharge and she was continued on furosemide 40 mg twice daily but reports no good response to this.  Patient was recently seen in the clinic by Dr. Fletcher Anon for  CHF/A. fib follow-up.  She was switched from furosemide to torsemide 40 mg twice daily.  Patient states that this medication has not helped with her swelling.  On arrival to the ED today, she was afebrile with blood pressure 100/49 mm Hg and pulse rate 67 beats/min. There were no focal neurological deficits; she was alert and oriented x4 but in notable discomfort from back pain.  Patient labs revealed hemoglobin 6.7, sodium 133, BUN 74, creatinine 3.8, troponin 13, BNP 790, PT 24.3, INR 2.2, COVID negative.  Chest x-ray showed cardiomegaly with pulmonary vascular congestion and interstitial edema.  Given that she has dropped her hemoglobin by 4 points in 2 days due to rectal bleeding patient was given vitamin K 2.5 mg p.o. to reverse INR in the ED.  She also received 40 mg of IV Lasix in the ED as well pending blood transfusion for drop in hemoglobin.  Given the complex nature of her presenting symptoms requiring close monitoring patient will be admitted to stepdown unit.  PAST MEDICAL HISTORY:   Past Medical History:  Diagnosis Date   Carotid arterial disease ([place])    a. 02/2012 s/p L CEA.   CKD stage G3b/A1, GFR 30-44 and albumin creatinine ratio <30 mg/g    Coronary artery disease    a. 02/2012 [facility] MV: Anterior, anteroseptal, septal, apical infarct with mild peri-infarct ischemia, EF 41%- medically managed due to anemia; b. 03/2017 Cath: LM nl, LAD 99ost/p/m - faint L->L collaterals, RI 20ost, LCX 30m, RCA 20p, 78m-->Med Rx for chronic subtotal LAD dzs. Avoid R Radial access in future.   Diabetes  mellitus without complication ([place])    Type II   Hyperlipidemia    Hypertension    Ischemic cardiomyopathy    a. 06/2013 Echo: EF 35-40%, glob HK, mildly dil LA, mild LVH, mild TR, mild to mod MR; b. 07/2016 Echo: EF 25-30%, sev mid-apicalanteroseptal, ant, apical HK, mild MR, sev dil LA, mod dil RA, mod TR, PASP 99mmHg; c. 11/2016 Echo: EF 20-25%, antsept DK, Gr2 DD, mild MR, mod dil LA/RV/RA, mod  red RV fxn, mod-sev TR, PASP 40-14mmHg.   Morbid obesity ([place])    Normocytic anemia    Persistent atrial fibrillation ([facility])    a. CHA2DS2VASc = 7-->coumadin; maintaining sinus on amio.   Stroke [facility]) 2013    PAST SURGICAL HISTORY:   Past Surgical History:  Procedure Laterality Date   AMPUTATION TOE Left 09/22/2016   Procedure: AMPUTATION TOE;  Surgeon: Albertine Patricia, DPM;  Location: [facility];  Service: Podiatry;  Laterality: Left;   CARDIOVERSION N/A 07/28/2016   Procedure: CARDIOVERSION;  Surgeon: Wellington Hampshire, MD;  Location: [facility];  Service: Cardiovascular;  Laterality: N/A;   CARDIOVERSION N/A 08/07/2016   Procedure: CARDIOVERSION;  Surgeon: Wellington Hampshire, MD;  Location: [facility];  Service: Cardiovascular;  Laterality: N/A;   CARDIOVERSION N/A 01/03/2019   Procedure: CARDIOVERSION;  Surgeon: Minna Merritts, MD;  Location: [facility];  Service: Cardiovascular;  Laterality: N/A;   CAROTID ENDARTERECTOMY     [facility]; Dr. Lucky Cowboy   CHOLECYSTECTOMY     foot infection Right    LEG SURGERY     right;infection   RIGHT/LEFT HEART CATH AND CORONARY ANGIOGRAPHY Bilateral 03/12/2017   Procedure: RIGHT/LEFT HEART CATH AND CORONARY ANGIOGRAPHY;  Surgeon: Wellington Hampshire, MD;  Location: [facility];  Service: Cardiovascular;  Laterality: Bilateral;   TONSILLECTOMY      SOCIAL HISTORY:   Social History   Tobacco Use   Smoking status: Former Smoker    Packs/day: 0.50    Years: 5.00    Pack years: 2.50    Types: Cigarettes   Smokeless tobacco: Never Used  Substance Use Topics   Alcohol use: No    FAMILY HISTORY:   Family History  Problem Relation Age of Onset   Heart disease Mother    Diabetes Mother    Heart disease Father    Hypertension Father    Diabetes Maternal Grandmother     DRUG ALLERGIES:  No Known Allergies  REVIEW OF SYSTEMS:   Review of Systems  Constitutional: Negative for chills, fever, malaise/fatigue and weight loss.    HENT: Negative for congestion, hearing loss and sore throat.   Eyes: Negative for blurred vision and double vision.  Respiratory: Positive for shortness of breath. Negative for cough and wheezing.   Cardiovascular: Positive for orthopnea, leg swelling and PND. Negative for chest pain and palpitations.  Gastrointestinal: Positive for blood in stool, nausea and vomiting. Negative for abdominal pain and diarrhea.  Genitourinary: Positive for flank pain. Negative for dysuria and urgency.  Musculoskeletal: Positive for back pain. Negative for myalgias.  Skin: Negative for rash.  Neurological: Positive for weakness. Negative for dizziness, sensory change, speech change, focal weakness and headaches.  Psychiatric/Behavioral: Negative for depression.   MEDICATIONS AT HOME:   Prior to Admission medications   Medication Sig Start Date End Date Taking? Authorizing Provider  amiodarone (PACERONE) 200 MG tablet Take 200 mg by mouth 2 (two) times daily.    [provider]  atorvastatin (LIPITOR) 40 MG tablet Take 1 tablet by  mouth once daily Patient taking differently: Take 40 mg by mouth daily.  11/25/18   Wellington Hampshire, MD  carvedilol (COREG) 6.25 MG tablet TAKE 1 TABLET BY MOUTH TWICE DAILY WITH A MEAL 01/06/19   Wellington Hampshire, MD  glipiZIDE (GLUCOTROL) 10 MG tablet Take 10 mg by mouth 2 (two) times daily. Dr Honor Junes 08/13/15   [provider]  Insulin Degludec 200 UNIT/ML SOPN Inject 25 Units into the skin daily at 12 noon. Dr Honor Junes (1300) 02/18/18   [provider]  levothyroxine (SYNTHROID) 50 MCG tablet Take 50 mcg by mouth daily before breakfast.    [provider]  losartan (COZAAR) 25 MG tablet Take 1 tablet (25 mg total) by mouth daily. 12/15/18 12/15/19  Bettey Costa, MD  torsemide (DEMADEX) 20 MG tablet Take 2 tablets (40 mg total) by mouth 2 (two) times daily. 01/09/19   Wellington Hampshire, MD  triamcinolone cream (KENALOG) 0.5 % Apply 1 application  topically 2 (two) times daily as needed (psoriasis).     [provider]  warfarin (COUMADIN) 5 MG tablet USE AS DIRECTED BY COUMADIN CLINIC FOR 30 DAYS Patient not taking: Take one 5 mg tablet Sunday, Tuesday, Wednesday, Thursday, and Saturday. And one and one-half tablets 7.5 mg on Monday and Friday 11/25/18   Wellington Hampshire, MD      VITAL SIGNS:  Blood pressure (!) 116/50, pulse 73, temperature (!) 97.5 F (36.4 C), temperature source Oral, resp. rate 14, height 5\' 7"  (1.702 m), weight 120.2 kg, SpO2 98 %.  PHYSICAL EXAMINATION:   Physical Exam  GENERAL:  68 y.o.-year-old patient lying in the bed with no acute distress.  EYES: Pupils equal, round, reactive to light and accommodation. No scleral icterus. Extraocular muscles intact.  HEENT: Head atraumatic, normocephalic. Oropharynx and nasopharynx clear.  NECK:  Supple, no jugular venous distention. No thyroid enlargement, no tenderness.  LUNGS: Normal breath sounds bilaterally, no wheezing, rales,rhonchi or crepitation. No use of accessory muscles of respiration.  CARDIOVASCULAR: S1, S2 normal. No murmurs, rubs, or gallops.  ABDOMEN: Soft, nontender, distended. Bowel sounds present. No organomegaly or mass. Right flank tenderness EXTREMITIES: Bilateral pitting edema of lower legs left>right, cyanosis, or clubbing.  NEUROLOGIC: Cranial nerves II through XII are intact. Muscle strength 5/5 in all extremities. Sensation intact. Gait not checked.  PSYCHIATRIC: The patient is alert and oriented x 3.  SKIN: Multiple bruising. Left great toe amputation  DATA REVIEWED:  LABORATORY PANEL:   CBC Recent Labs  Lab 01/17/19 0011  WBC 6.6  HGB 6.7*  HCT 21.7*  PLT 193   ------------------------------------------------------------------------------------------------------------------  Chemistries  Recent Labs  Lab 01/17/19 0011  NA 133*  K 5.0  CL 100  CO2 24  GLUCOSE 119*  BUN 74*  CREATININE 3.82*  CALCIUM 7.7*    AST 42*  ALT 23  ALKPHOS 103  BILITOT 1.3*   ------------------------------------------------------------------------------------------------------------------  Cardiac Enzymes No results for input(s): TROPONINI in the last 168 hours. ------------------------------------------------------------------------------------------------------------------  RADIOLOGY:  Dg Chest 1 View  Result Date: 01/17/2019 CLINICAL DATA:  Shortness of breath EXAM: CHEST  1 VIEW COMPARISON:  December 13, 2018 FINDINGS: There is stable cardiomegaly. Pulmonary vascular congestion and mild increased interstitial markings throughout both lungs. No focal airspace consolidation. Bilateral shoulder arthritis is seen. IMPRESSION: Cardiomegaly with pulmonary vascular congestion and interstitial edema. Electronically Signed   By: Prudencio Pair M.D.   On: 01/17/2019 00:55    EKG:  EKG: unchanged from previous tracings, atrial fibrillation, rate 67.  IMPRESSION AND PLAN:   68 y.o. female with pertinent past medical history of chronic systolic heart failure CAD, persistent atrial fibrillation s/p cardioversion on Coumadin, diabetes mellitus, chronic anemia, CKD, hyperlipidemia, carotid artery disease s/p left carotid endarterectomy with previous CVA, and hypertension into the ED with chief complaints of right flank pain, shortness of breath and rectal bleed.  1. Acute on chronic systolic Congestive Heart Failure: Acute presentation likely due to volume overload with associated symptoms of SOB, BLE edema and. BNP mildly elevated at 790 -Admit to stepdown unit - Chest x-ray shows pulmonary vascular congestion with interstitial edema   Last Echo 12/2018 with severely reduced LV EF 20-35% - Hold Coreg due to hypotension on presentation - Hold losartan in the setting of worsening renal function - Received Lasix 40 mg x 1 in the ED, holding scheduled Lasix for now given worsening renal function - Low salt diet  - Strict  I&Os - Cardiology Consult placed to Dr. Rockey Situ  2. GI Bleed-patient presenting with rectal bleed found to have hemoglobin 6.7 dropped from 10.3 - Status post vitamin K to reverse Coumadin - Pending transfusion of 1 unit PRBCs over 2 hours, will monitor for volume overload - H&H monitoring q6h -Transfuse PRN Hgb<8 - Pantoprazole gtt 8mg /hr - NPO for now - Hold NSAIDs, steroids, ASA - GI Consult placed to Dr. Vicente Males  3. Acute renal failure superimposed on CKD -BUN/creatinine slightly elevated above baseline - Hold nephrotoxins - Nephrology consult placed to Dr. Candiss Norse  4. Acute right flank pain - CT abdomen/pelvis without contrast for further evaluation  5. Persistent atrial fibrillation - s/p cardioversion still in A. Fib - Continue amiodarone per cardiology - Hold Coumadin for now - We will re-dose per pharmacy - Continue to monitor INR  6. HTN  + Goal BP <130/80 -Hold Antihypertensive due to hypotension  7. HLD  + Goal LDL<100 - Atorvastatin 40mg  PO qhs  8. Diabetes mellitus - Recent hgb A1c 7.2 - Hold Glipizide - SSI  9. Hypothyroidism -continue levothyroxine  10. History of CVA without residual deficit  -Holding Coumadin for now in the setting of active GI bleed.   Hold anticoagulation for DVT prophylaxis due to GI bleed.  Cannot tolerate SCDs due to bilateral lower extremity edema with weeping.  All the records are reviewed and case discussed with ED provider. Management plans discussed with the patient, family and they are in agreement.  CODE STATUS: FULL  TOTAL TIME TAKING CARE OF THIS PATIENT: 50 minutes.    on 01/17/2019 at 2:47 AM   Rufina Falco, DNP, FNP-BC [facility] Nurse Practitioner Between 7am to 6pm - Pager [phone]  After 6pm go to [email or web address] - password [email or web address] [facility]  Office  [phone]  CC: Primary care physician; [name], MD

## 2019-01-17 NOTE — Progress Notes (Signed)
Hypoglycemic Event  CBG: 53   Treatment: 1 amp d50  Symptoms:ams  Follow-up CBG: Time:1406 CBG Result:121  Possible Reasons for Event:   Comments/MD notified:dr kasa notified    Meghan [name]

## 2019-01-17 NOTE — ED Notes (Addendum)
ED TO INPATIENT HANDOFF REPORT  ED Nurse Name and Phone #: Karena Addison [phone]  S Name/Age/Gender Meghan [name] 68 y.o. female Room/Bed: ED14A/ED14A  Code Status   Code Status: Full Code  Home/SNF/Other Home Patient oriented to: self, place, time and situation Is this baseline? Yes   Triage Complete: Triage complete  Chief Complaint Difficulty breathing, retaining fluid  Triage Note Pt to triage via w/c, mask in place with no distress noted; st [place] with rt sided back pain and leg weakness but denies any recent illness; st hx CHF   Allergies No Known Allergies  Level of Care/Admitting Diagnosis ED Disposition    ED Disposition Condition [place]: Combine [100120]  Level of Care: Stepdown [14]  Covid Evaluation: Confirmed COVID Negative  Diagnosis: Acute on chronic systolic CHF (congestive heart failure) [id number]  Admitting Physician: Eula Flax  Attending Physician: Rufina Falco ACHIENG [phone]  Estimated length of stay: past midnight tomorrow  Certification:: I certify this patient will need inpatient services for at least 2 midnights  PT Class (Do Not Modify): Inpatient [101]  PT Acc Code (Do Not Modify): Private [1]       B Medical/Surgery History Past Medical History:  Diagnosis Date  . Carotid arterial disease ([place])    a. 02/2012 s/p L CEA.  . CKD stage G3b/A1, GFR 30-44 and albumin creatinine ratio <30 mg/g   . Coronary artery disease    a. 02/2012 [facility] MV: Anterior, anteroseptal, septal, apical infarct with mild peri-infarct ischemia, EF 41%- medically managed due to anemia; b. 03/2017 Cath: LM nl, LAD 99ost/p/m - faint L->L collaterals, RI 20ost, LCX 24m, RCA 20p, 82m-->Med Rx for chronic subtotal LAD dzs. Avoid R Radial access in future.  . 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 Hyperlipidemia   . Hypertension   . Ischemic cardiomyopathy    a. 06/2013 Echo: EF  35-40%, glob HK, mildly dil LA, mild LVH, mild TR, mild to mod MR; b. 07/2016 Echo: EF 25-30%, sev mid-apicalanteroseptal, ant, apical HK, mild MR, sev dil LA, mod dil RA, mod TR, PASP 43mmHg; c. 11/2016 Echo: EF 20-25%, antsept DK, Gr2 DD, mild MR, mod dil LA/RV/RA, mod red RV fxn, mod-sev TR, PASP 40-3mmHg.  . Morbid obesity ([place])   . Normocytic anemia   . Persistent atrial fibrillation (HCC)    a. CHA2DS2VASc = 7-->coumadin; maintaining sinus on amio.  . Stroke [facility]) 2013   Past Surgical History:  Procedure Laterality Date  . AMPUTATION TOE Left 09/22/2016   Procedure: AMPUTATION TOE;  Surgeon: Albertine Patricia, DPM;  Location: [facility] ORS;  Service: Podiatry;  Laterality: Left;  . CARDIOVERSION N/A 07/28/2016   Procedure: CARDIOVERSION;  Surgeon: Wellington Hampshire, MD;  Location: [facility] ORS;  Service: Cardiovascular;  Laterality: N/A;  . CARDIOVERSION N/A 08/07/2016   Procedure: CARDIOVERSION;  Surgeon: Wellington Hampshire, MD;  Location: [facility] ORS;  Service: Cardiovascular;  Laterality: N/A;  . CARDIOVERSION N/A 01/03/2019   Procedure: CARDIOVERSION;  Surgeon: Minna Merritts, MD;  Location: [facility] ORS;  Service: Cardiovascular;  Laterality: N/A;  . CAROTID ENDARTERECTOMY     [facility]; Dr. Lucky Cowboy  . CHOLECYSTECTOMY    . foot infection Right   . LEG SURGERY     right;infection  . RIGHT/LEFT HEART CATH AND CORONARY ANGIOGRAPHY Bilateral 03/12/2017   Procedure: RIGHT/LEFT HEART CATH AND CORONARY ANGIOGRAPHY;  Surgeon: Wellington Hampshire, MD;  Location: [facility];  Service: Cardiovascular;  Laterality: Bilateral;  . TONSILLECTOMY       A IV Location/Drains/Wounds Patient Lines/Drains/Airways Status   Active Line/Drains/Airways    Name:   Placement date:   Placement time:   Site:   Days:   Peripheral IV Left Antecubital   -    -    Antecubital      Peripheral IV 01/17/19 Left Antecubital   01/17/19    0136    Antecubital   less than 1   Incision (Closed) 09/22/16 Foot Left   09/22/16    1235      847   Wound / Incision (Open or Dehisced) 07/26/16 Diabetic ulcer Foot Right;Left   07/26/16    1620    Foot   905   Wound / Incision (Open or Dehisced) 09/21/16 Diabetic ulcer Foot Left red swollen abrasion to top, dry. weeping   09/21/16    2107    Foot   848   Wound / Incision (Open or Dehisced) 09/21/16 Diabetic ulcer Foot Right eschar 1x1 cm   09/21/16    2108    Foot   848   Wound / Incision (Open or Dehisced) 03/30/17 Diabetic ulcer Toe (Comment  which one) Left   03/30/17    2010    Toe (Comment  which one)   658          Intake/Output Last 24 hours No intake or output data in the 24 hours ending 01/17/19 0340  Labs/Imaging Results for orders placed or performed during the hospital encounter of 01/17/19 (from the past 48 hour(s))  CBC with Differential     Status: Abnormal   Collection Time: 01/17/19 12:11 AM  Result Value Ref Range   WBC 6.6 4.0 - 10.5 K/uL   RBC 2.65 (L) 3.87 - 5.11 MIL/uL   Hemoglobin 6.7 (L) 12.0 - 15.0 g/dL   HCT 21.7 (L) 36.0 - 46.0 %   MCV 81.9 80.0 - 100.0 fL   MCH 25.3 (L) 26.0 - 34.0 pg   MCHC 30.9 30.0 - 36.0 g/dL   RDW 18.9 (H) 11.5 - 15.5 %   Platelets 193 150 - 400 K/uL   nRBC 0.0 0.0 - 0.2 %   Neutrophils Relative % 70 %   Neutro Abs 4.7 1.7 - 7.7 K/uL   Lymphocytes Relative 7 %   Lymphs Abs 0.5 (L) 0.7 - 4.0 K/uL   Monocytes Relative 14 %   Monocytes Absolute 0.9 0.1 - 1.0 K/uL   Eosinophils Relative 6 %   Eosinophils Absolute 0.4 0.0 - 0.5 K/uL   Basophils Relative 1 %   Basophils Absolute 0.1 0.0 - 0.1 K/uL   Immature Granulocytes 2 %   Abs Immature Granulocytes 0.10 (H) 0.00 - 0.07 K/uL    Comment: Performed at [facility], [address] [place], [place] [address]  Comprehensive metabolic panel     Status: Abnormal   Collection Time: 01/17/19 12:11 AM  Result Value Ref Range   Sodium 133 (L) 135 - 145 mmol/L   Potassium 5.0 3.5 - 5.1 mmol/L   Chloride 100 98 - 111 mmol/L   CO2 24 22 - 32 mmol/L   Glucose, Bld  119 (H) 70 - 99 mg/dL   BUN 74 (H) 8 - 23 mg/dL   Creatinine, Ser 3.82 (H) 0.44 - 1.00 mg/dL   Calcium 7.7 (L) 8.9 - 10.3 mg/dL   Total Protein 7.7 6.5 - 8.1 g/dL   Albumin 2.5 (L)  3.5 - 5.0 g/dL   AST 42 (H) 15 - 41 U/L   ALT 23 0 - 44 U/L   Alkaline Phosphatase 103 38 - 126 U/L   Total Bilirubin 1.3 (H) 0.3 - 1.2 mg/dL   GFR calc non Af Amer 11 (L) >60 mL/min   GFR calc Af Amer 13 (L) >60 mL/min   Anion gap 9 5 - 15    Comment: Performed at [facility], [place], [place] [address]  Troponin I (High Sensitivity)     Status: None   Collection Time: 01/17/19 12:11 AM  Result Value Ref Range   Troponin I (High Sensitivity) 13 <18 ng/L    Comment: (NOTE) Elevated high sensitivity troponin I (hsTnI) values and significant  changes across serial measurements may suggest ACS but many other  chronic and acute conditions are known to elevate hsTnI results.  Refer to the "Links" section for chest pain algorithms and additional  guidance. Performed at [facility], [address] [place], [place] [address]   Brain natriuretic peptide     Status: Abnormal   Collection Time: 01/17/19 12:11 AM  Result Value Ref Range   B Natriuretic Peptide 790.0 (H) 0.0 - 100.0 pg/mL    Comment: Performed at [facility], [address] [place], [place] [address]  Protime-INR     Status: Abnormal   Collection Time: 01/17/19 12:11 AM  Result Value Ref Range   Prothrombin Time 24.3 (H) 11.4 - 15.2 seconds   INR 2.2 (H) 0.8 - 1.2    Comment: (NOTE) INR goal varies based on device and disease states. Performed at [facility], [address] [place], [place] [address]   Type and screen [place]     Status: None   Collection Time: 01/17/19  1:41 AM  Result Value Ref Range   ABO/RH(D) O POS    Antibody Screen NEG    Sample Expiration      01/20/2019,2359 Performed at [facility], [address]  [place], [place] [address]   SARS Coronavirus 2 by RT PCR (hospital order, performed in [facility] hospital lab) Nasopharyngeal Nasopharyngeal Swab     Status: None   Collection Time: 01/17/19  1:41 AM   Specimen: Nasopharyngeal Swab  Result Value Ref Range   SARS Coronavirus 2 NEGATIVE NEGATIVE    Comment: (NOTE) If result is NEGATIVE SARS-CoV-2 target nucleic acids are NOT DETECTED. The SARS-CoV-2 RNA is generally detectable in upper and lower  respiratory specimens during the acute phase of infection. The lowest  concentration of SARS-CoV-2 viral copies this assay can detect is 250  copies / mL. A negative result does not preclude SARS-CoV-2 infection  and should not be used as the sole basis for treatment or other  patient management decisions.  A negative result may occur with  improper specimen collection / handling, submission of specimen other  than nasopharyngeal swab, presence of viral mutation(s) within the  areas targeted by this assay, and inadequate number of viral copies  (<250 copies / mL). A negative result must be combined with clinical  observations, patient history, and epidemiological information. If result is POSITIVE SARS-CoV-2 target nucleic acids are DETECTED. The SARS-CoV-2 RNA is generally detectable in upper and lower  respiratory specimens dur ing the acute phase of infection.  Positive  results are indicative of active infection with SARS-CoV-2.  Clinical  correlation with patient history and other diagnostic information is  necessary to  determine patient infection status.  Positive results do  not rule out bacterial infection or co-infection with other viruses. If result is PRESUMPTIVE POSTIVE SARS-CoV-2 nucleic acids MAY BE PRESENT.   A presumptive positive result was obtained on the submitted specimen  and confirmed on repeat testing.  While 2019 novel coronavirus  (SARS-CoV-2) nucleic acids may be present in the submitted sample  additional confirmatory  testing may be necessary for epidemiological  and / or clinical management purposes  to differentiate between  SARS-CoV-2 and other Sarbecovirus currently known to infect humans.  If clinically indicated additional testing with an alternate test  methodology [phone]) is advised. The SARS-CoV-2 RNA is generally  detectable in upper and lower respiratory sp ecimens during the acute  phase of infection. The expected result is Negative. Fact Sheet for Patients:  StrictlyIdeas.no Fact Sheet for Healthcare Providers: BankingDealers.co.za This test is not yet approved or cleared by the [place] FDA and has been authorized for detection and/or diagnosis of SARS-CoV-2 by FDA under an Emergency Use Authorization (EUA).  This EUA will remain in effect (meaning this test can be used) for the duration of the COVID-19 declaration under Section 564(b)(1) of the Act, 21 U.S.C. section 360bbb-3(b)(1), unless the authorization is terminated or revoked sooner. Performed at [facility], [address] [place], [place] [address]    Dg Chest 1 View  Result Date: 01/17/2019 CLINICAL DATA:  Shortness of breath EXAM: CHEST  1 VIEW COMPARISON:  December 13, 2018 FINDINGS: There is stable cardiomegaly. Pulmonary vascular congestion and mild increased interstitial markings throughout both lungs. No focal airspace consolidation. Bilateral shoulder arthritis is seen. IMPRESSION: Cardiomegaly with pulmonary vascular congestion and interstitial edema. Electronically Signed   By: Prudencio Pair M.D.   On: 01/17/2019 00:55    Pending Labs Unresulted Labs (From admission, onward)    Start     Ordered   01/17/19 0250  ABO/Rh  Once,   STAT     01/17/19 0250   01/17/19 0246  Urinalysis, Complete w Microscopic  Once,   STAT     01/17/19 0245   01/17/19 0246  Urine Culture  Add-on,   AD    Question:  Patient immune status  Answer:  Normal   01/17/19 0245    01/17/19 0202  Prepare RBC  (Adult Blood Administration - PRBC)  ONCE - STAT,   STAT    Question Answer Comment  # of Units 1 unit   Transfusion Indications Actively Bleeding / GI Bleed   If emergent release call blood bank Not emergent release      01/17/19 0202          Vitals/Pain Today's Vitals   01/17/19 0001 01/17/19 0007 01/17/19 0030 01/17/19 0200  BP:   101/70 (!) 116/50  Pulse:   76 73  Resp:   (!) 22 14  Temp:      TempSrc:      SpO2:   100% 98%  Weight: 120.2 kg     Height: 5\' 7"  (1.702 m)     PainSc:  6       Isolation Precautions No active isolations  Medications Medications  0.9 %  sodium chloride infusion (has no administration in time range)  morphine 4 MG/ML injection 4 mg (has no administration in time range)  phytonadione (VITAMIN K) tablet 2.5 mg (has no administration in time range)  sodium chloride flush (NS) 0.9 % injection 3 mL (has no administration in time range)  sodium chloride  flush (NS) 0.9 % injection 3 mL (has no administration in time range)  0.9 %  sodium chloride infusion (has no administration in time range)  acetaminophen (TYLENOL) tablet 650 mg (has no administration in time range)  ondansetron (ZOFRAN) injection 4 mg (has no administration in time range)  insulin aspart (novoLOG) injection 0-9 Units (has no administration in time range)  insulin aspart (novoLOG) injection 0-5 Units (has no administration in time range)  morphine 2 MG/ML injection 2 mg (2 mg Intravenous Given 01/17/19 0137)  ondansetron (ZOFRAN) injection 4 mg (4 mg Intravenous Given 01/17/19 0137)  furosemide (LASIX) injection 40 mg (40 mg Intravenous Given 01/17/19 0137)    Mobility walks with device Low fall risk   Focused Assessments    R Recommendations: See Admitting Provider Note  Report given to: Adonis Brook, RN

## 2019-01-17 NOTE — Consult Note (Signed)
[address] [place], [place] [address] Phone [phone]. Fax [phone]  Date: 01/17/2019                  Patient Name:  Meghan [name]  MRN: [id number]  DOB: [birth year]  Age / Sex: 68 y.o., female         PCP: [name], MD                 Service Requesting Consult: IM/ Hillary Bow, MD                 Reason for Consult: ARF            History of Present Illness: Patient is a 68 y.o. Caucasian female with medical problems of chronic systolic congestive heart failure, coronary disease, atrial fibrillation requiring anticoagulation, diabetes, anemia, carotid artery disease with history of left carotid endarterectomy, previous stroke, hypertension, chronic kidney disease, who was admitted to [facility] on 01/17/2019 for shortness of breath and right flank pain. She reported in the emergency room that her shortness of that has been getting worse lately and she has developed edema of both legs which is weeping clear fluid.  She also reported bright red blood from rectum 2 days prior to admission.  Overall reports generalized weakness She is now admitted to the ICU and being monitored there Renal consult for acute renal failure Baseline creatinine appears to be 1.70/GFR 30/35 from December 15, 2018 Admission creatinine 2.39 which has increased to 3.82 today Albumin is low at 2.5 Potassium is borderline elevated at 5.0  Currently blood sugar is low. Patient is sitting up eating breakfast when seen  Medications: Outpatient medications: Medications Prior to Admission  Medication Sig Dispense Refill Last Dose  . amiodarone (PACERONE) 200 MG tablet Take 200 mg by mouth 2 (two) times daily.   Unknown at Unknown  . atorvastatin (LIPITOR) 40 MG tablet Take 1 tablet by mouth once daily (Patient taking differently: Take 40 mg by mouth daily. ) 90 tablet 0 Unknown at Unknown  . carvedilol (COREG) 6.25 MG tablet TAKE 1 TABLET BY MOUTH TWICE DAILY WITH A MEAL 180 tablet 0 Unknown  at Unknown  . glipiZIDE (GLUCOTROL) 10 MG tablet Take 10 mg by mouth 2 (two) times daily. Dr Honor Junes   Unknown at Unknown  . Insulin Degludec 200 UNIT/ML SOPN Inject 25 Units into the skin daily at 12 noon. Dr Honor Junes (1300)   Unknown at Unknown  . levothyroxine (SYNTHROID) 50 MCG tablet Take 50 mcg by mouth daily before breakfast.   Unknown at Unknown  . losartan (COZAAR) 25 MG tablet Take 1 tablet (25 mg total) by mouth daily. 30 tablet 11 Unknown at Unknown  . torsemide (DEMADEX) 20 MG tablet Take 2 tablets (40 mg total) by mouth 2 (two) times daily. 120 tablet 4 Unknown at Unknown  . triamcinolone cream (KENALOG) 0.5 % Apply 1 application topically 2 (two) times daily as needed (psoriasis).    prn at prn  . warfarin (COUMADIN) 5 MG tablet USE AS DIRECTED BY COUMADIN CLINIC FOR 30 DAYS (Patient not taking: Take one 5 mg tablet Sunday, Tuesday, Wednesday, Thursday, and Saturday. And one and one-half tablets 7.5 mg on Monday and Friday) 45 tablet 3 Not Taking at Unknown time    Current medications: Current Facility-Administered Medications  Medication Dose Route Frequency Provider Last Rate Last Dose  . 0.9 %  sodium chloride infusion (Manually program via Guardrails IV Fluids)   Intravenous Once Darel Hong  D, NP      . 0.9 %  sodium chloride infusion  10 mL/hr Intravenous Once Hinda Kehr, MD      . 0.9 %  sodium chloride infusion  250 mL Intravenous PRN Lang Snow, NP      . acetaminophen (TYLENOL) tablet 650 mg  650 mg Oral Q4H PRN Lang Snow, NP      . amiodarone (PACERONE) tablet 200 mg  200 mg Oral BID Lang Snow, NP      . atorvastatin (LIPITOR) tablet 40 mg  40 mg Oral Daily Ouma, Bing Neighbors, NP      . cefTRIAXone (ROCEPHIN) 1 g in sodium chloride 0.9 % 100 mL IVPB  1 g Intravenous Q24H Bradly Bienenstock, NP      . Chlorhexidine Gluconate Cloth 2 % PADS 6 each  6 each Topical Q0600 Ouma, Bing Neighbors, NP      . insulin aspart  (novoLOG) injection 0-5 Units  0-5 Units Subcutaneous QHS Ouma, Bing Neighbors, NP      . insulin aspart (novoLOG) injection 0-9 Units  0-9 Units Subcutaneous TID WC Ouma, Bing Neighbors, NP      . levothyroxine (SYNTHROID) tablet 50 mcg  50 mcg Oral QAC breakfast Lang Snow, NP      . morphine 2 MG/ML injection 2 mg  2 mg Intravenous Once Darel Hong D, NP      . morphine 4 MG/ML injection 4 mg  4 mg Intravenous Once Hinda Kehr, MD      . ondansetron [facility]) injection 4 mg  4 mg Intravenous Q6H PRN Lang Snow, NP      . pantoprazole (PROTONIX) 80 mg in sodium chloride 0.9 % 100 mL IVPB  80 mg Intravenous Once Darel Hong D, NP      . pantoprazole (PROTONIX) 80 mg in sodium chloride 0.9 % 250 mL (0.32 mg/mL) infusion  8 mg/hr Intravenous Continuous Bradly Bienenstock, NP      . Derrill Memo ON 01/20/2019] pantoprazole (PROTONIX) injection 40 mg  40 mg Intravenous Q12H Darel Hong D, NP      . phytonadione (VITAMIN K) tablet 2.5 mg  2.5 mg Oral Once Hinda Kehr, MD      . sodium chloride flush (NS) 0.9 % injection 3 mL  3 mL Intravenous Q12H Ouma, Bing Neighbors, NP      . sodium chloride flush (NS) 0.9 % injection 3 mL  3 mL Intravenous PRN Lang Snow, NP          Allergies: No Known Allergies    Past Medical History: Past Medical History:  Diagnosis Date  . Carotid arterial disease ([place])    a. 02/2012 s/p L CEA.  . CKD stage G3b/A1, GFR 30-44 and albumin creatinine ratio <30 mg/g   . Coronary artery disease    a. 02/2012 [facility]i MV: Anterior, anteroseptal, septal, apical infarct with mild peri-infarct ischemia, EF 41%- medically managed due to anemia; b. 03/2017 Cath: LM nl, LAD 99ost/p/m - faint L->L collaterals, RI 20ost, LCX 62m, RCA 20p, 52m-->Med Rx for chronic subtotal LAD dzs. Avoid R Radial access in future.  . 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 Hyperlipidemia   . Hypertension   . Ischemic  cardiomyopathy    a. 06/2013 Echo: EF 35-40%, glob HK, mildly dil LA, mild LVH, mild TR, mild to mod MR; b. 07/2016 Echo: EF 25-30%, sev mid-apicalanteroseptal, ant, apical HK, mild MR, sev dil LA,  mod dil RA, mod TR, PASP 54mmHg; c. 11/2016 Echo: EF 20-25%, antsept DK, Gr2 DD, mild MR, mod dil LA/RV/RA, mod red RV fxn, mod-sev TR, PASP 40-50mmHg.  . Morbid obesity ([place])   . Normocytic anemia   . Persistent atrial fibrillation (HCC)    a. CHA2DS2VASc = 7-->coumadin; maintaining sinus on amio.  . Stroke [facility]) 2013     Past Surgical History: Past Surgical History:  Procedure Laterality Date  . AMPUTATION TOE Left 09/22/2016   Procedure: AMPUTATION TOE;  Surgeon: Albertine Patricia, DPM;  Location: [facility] ORS;  Service: Podiatry;  Laterality: Left;  . CARDIOVERSION N/A 07/28/2016   Procedure: CARDIOVERSION;  Surgeon: Wellington Hampshire, MD;  Location: [facility] ORS;  Service: Cardiovascular;  Laterality: N/A;  . CARDIOVERSION N/A 08/07/2016   Procedure: CARDIOVERSION;  Surgeon: Wellington Hampshire, MD;  Location: [facility] ORS;  Service: Cardiovascular;  Laterality: N/A;  . CARDIOVERSION N/A 01/03/2019   Procedure: CARDIOVERSION;  Surgeon: Minna Merritts, MD;  Location: [facility] ORS;  Service: Cardiovascular;  Laterality: N/A;  . CAROTID ENDARTERECTOMY     [facility]; Dr. Lucky Cowboy  . CHOLECYSTECTOMY    . foot infection Right   . LEG SURGERY     right;infection  . RIGHT/LEFT HEART CATH AND CORONARY ANGIOGRAPHY Bilateral 03/12/2017   Procedure: RIGHT/LEFT HEART CATH AND CORONARY ANGIOGRAPHY;  Surgeon: Wellington Hampshire, MD;  Location: [facility];  Service: Cardiovascular;  Laterality: Bilateral;  . TONSILLECTOMY       Family History: Family History  Problem Relation Age of Onset  . Heart disease Mother   . Diabetes Mother   . Heart disease Father   . Hypertension Father   . Diabetes Maternal Grandmother      Social History: Social History   Socioeconomic History  . Marital status: Widowed    Spouse  name: Not on file  . Number of children: Not on file  . Years of education: Not on file  . Highest education level: Not on file  Occupational History  . Not on file  Social Needs  . Financial resource strain: Not on file  . Food insecurity    Worry: Not on file    Inability: Not on file  . Transportation needs    Medical: Not on file    Non-medical: Not on file  Tobacco Use  . Smoking status: Former Smoker    Packs/day: 0.50    Years: 5.00    Pack years: 2.50    Types: Cigarettes  . Smokeless tobacco: Never Used  Substance and Sexual Activity  . Alcohol use: No  . Drug use: No  . Sexual activity: Not Currently  Lifestyle  . Physical activity    Days per week: 0 days    Minutes per session: Not on file  . Stress: Not on file  Relationships  . Social Herbalist on phone: Three times a week    Gets together: Not on file    Attends religious service: Not on file    Active member of club or organization: Not on file    Attends meetings of clubs or organizations: Not on file    Relationship status: Not on file  . Intimate partner violence    Fear of current or ex partner: Not on file    Emotionally abused: Not on file    Physically abused: Not on file    Forced sexual activity: Not on file  Other Topics Concern  . Not on file  Social History Narrative  . Not on file    Review of Systems: Gen: Denies any fevers or chills HEENT: No vision or hearing problems CV: No chest pain.  Does report shortness of breath with exertion Resp: No cough or sputum production GI: No nausea, vomiting or diarrhea.  No blood in the stool GU : No problems with voiding.  No hematuria.  MS: Ambulatory.  Denies any acute joint pain or swelling Derm:   No complaints Psych: No complaints Heme: No complaints Neuro: No complaints Endocrine: No complaints    Vital Signs: Blood pressure 102/79, pulse 81, temperature (!) 97.4 F (36.3 C), temperature source Oral, resp. rate 15,  height 5\' 7"  (1.702 m), weight (!) 139 kg, SpO2 98 %.  No intake or output data in the 24 hours ending 01/17/19 0804  Weight trends: Filed Weights   01/17/19 0001 01/17/19 0439  Weight: 120.2 kg (!) 139 kg    Physical Exam: General:  No acute distress, obese female, laying in the bed  HEENT  moist oral mucous membranes  Neck:  Short  Lungs:  Mild bilateral crackles, [place] O2  Heart::  Regular rhythm   Abdomen:  Soft, nontender  Extremities:  2-3+ pitting edema  Neurologic:  Alert, able to answer questions  Skin:  Warm, dry    Lab results: Basic Metabolic Panel: Recent Labs  Lab 01/17/19 0011  NA 133*  K 5.0  CL 100  CO2 24  GLUCOSE 119*  BUN 74*  CREATININE 3.82*  CALCIUM 7.7*    Liver Function Tests: Recent Labs  Lab 01/17/19 0011  AST 42*  ALT 23  ALKPHOS 103  BILITOT 1.3*  PROT 7.7  ALBUMIN 2.5*   No results for input(s): LIPASE, AMYLASE in the last 168 hours. No results for input(s): AMMONIA in the last 168 hours.  CBC: Recent Labs  Lab 01/17/19 0011  WBC 6.6  NEUTROABS 4.7  HGB 6.7*  HCT 21.7*  MCV 81.9  PLT 193    Cardiac Enzymes: No results for input(s): CKTOTAL, TROPONINI in the last 168 hours.  BNP: Invalid input(s): POCBNP  CBG: Recent Labs  Lab 01/17/19 0438  GLUCAP 54*    Microbiology: Recent Results (from the past 720 hour(s))  SARS CORONAVIRUS 2 (TAT 6-24 HRS) Nasopharyngeal Nasopharyngeal Swab     Status: None   Collection Time: 12/31/18 12:52 PM   Specimen: Nasopharyngeal Swab  Result Value Ref Range Status   SARS Coronavirus 2 NEGATIVE NEGATIVE Final    Comment: (NOTE) SARS-CoV-2 target nucleic acids are NOT DETECTED. The SARS-CoV-2 RNA is generally detectable in upper and lower respiratory specimens during the acute phase of infection. Negative results do not preclude SARS-CoV-2 infection, do not rule out co-infections with other pathogens, and should not be used as the sole basis for treatment or other patient  management decisions. Negative results must be combined with clinical observations, patient history, and epidemiological information. The expected result is Negative. Fact Sheet for Patients: SugarRoll.be Fact Sheet for Healthcare Providers: https://www.woods-mathews.com/ This test is not yet approved or cleared by the [place] FDA and  has been authorized for detection and/or diagnosis of SARS-CoV-2 by FDA under an Emergency Use Authorization (EUA). This EUA will remain  in effect (meaning this test can be used) for the duration of the COVID-19 declaration under Section 56 4(b)(1) of the Act, 21 U.S.C. section 360bbb-3(b)(1), unless the authorization is terminated or revoked sooner. Performed at [facility], [place] [address], [place], [place]  [address]   SARS Coronavirus 2 by RT PCR (hospital order, performed in [facility] hospital lab) Nasopharyngeal Nasopharyngeal Swab     Status: None   Collection Time: 01/17/19  1:41 AM   Specimen: Nasopharyngeal Swab  Result Value Ref Range Status   SARS Coronavirus 2 NEGATIVE NEGATIVE Final    Comment: (NOTE) If result is NEGATIVE SARS-CoV-2 target nucleic acids are NOT DETECTED. The SARS-CoV-2 RNA is generally detectable in upper and lower  respiratory specimens during the acute phase of infection. The lowest  concentration of SARS-CoV-2 viral copies this assay can detect is 250  copies / mL. A negative result does not preclude SARS-CoV-2 infection  and should not be used as the sole basis for treatment or other  patient management decisions.  A negative result may occur with  improper specimen collection / handling, submission of specimen other  than nasopharyngeal swab, presence of viral mutation(s) within the  areas targeted by this assay, and inadequate number of viral copies  (<250 copies / mL). A negative result must be combined with clinical  observations, patient history, and  epidemiological information. If result is POSITIVE SARS-CoV-2 target nucleic acids are DETECTED. The SARS-CoV-2 RNA is generally detectable in upper and lower  respiratory specimens dur ing the acute phase of infection.  Positive  results are indicative of active infection with SARS-CoV-2.  Clinical  correlation with patient history and other diagnostic information is  necessary to determine patient infection status.  Positive results do  not rule out bacterial infection or co-infection with other viruses. If result is PRESUMPTIVE POSTIVE SARS-CoV-2 nucleic acids MAY BE PRESENT.   A presumptive positive result was obtained on the submitted specimen  and confirmed on repeat testing.  While 2019 novel coronavirus  (SARS-CoV-2) nucleic acids may be present in the submitted sample  additional confirmatory testing may be necessary for epidemiological  and / or clinical management purposes  to differentiate between  SARS-CoV-2 and other Sarbecovirus currently known to infect humans.  If clinically indicated additional testing with an alternate test  methodology [phone]) is advised. The SARS-CoV-2 RNA is generally  detectable in upper and lower respiratory sp ecimens during the acute  phase of infection. The expected result is Negative. Fact Sheet for Patients:  StrictlyIdeas.no Fact Sheet for Healthcare Providers: BankingDealers.co.za This test is not yet approved or cleared by the [place] FDA and has been authorized for detection and/or diagnosis of SARS-CoV-2 by FDA under an Emergency Use Authorization (EUA).  This EUA will remain in effect (meaning this test can be used) for the duration of the COVID-19 declaration under Section 564(b)(1) of the Act, 21 U.S.C. section 360bbb-3(b)(1), unless the authorization is terminated or revoked sooner. Performed at [facility], [address] [place], [place] [address]   MRSA PCR  Screening     Status: None   Collection Time: 01/17/19  4:44 AM   Specimen: Nasal Mucosa; Nasopharyngeal  Result Value Ref Range Status   MRSA by PCR NEGATIVE NEGATIVE Final    Comment:        The GeneXpert MRSA Assay (FDA approved for NASAL specimens only), is one component of a comprehensive MRSA colonization surveillance program. It is not intended to diagnose MRSA infection nor to guide or monitor treatment for MRSA infections. Performed at [facility], [address], [place], [place] [address]      Coagulation Studies: Recent Labs    01/17/19 0011  LABPROT 24.3*  INR 2.2*    Urinalysis:  No results for input(s): COLORURINE, LABSPEC, [place], GLUCOSEU, HGBUR, BILIRUBINUR, KETONESUR, PROTEINUR, UROBILINOGEN, NITRITE, LEUKOCYTESUR in the last 72 hours.  Invalid input(s): APPERANCEUR      Imaging: Ct Abdomen Pelvis Wo Contrast  Result Date: 01/17/2019 CLINICAL DATA:  Right flank pain and leg weakness. EXAM: CT ABDOMEN AND PELVIS WITHOUT CONTRAST TECHNIQUE: Multidetector CT imaging of the abdomen and pelvis was performed following the standard protocol without IV contrast. COMPARISON:  None. FINDINGS: Lower chest:  Cardiomegaly with multifocal coronary atherosclerosis. Hepatobiliary: No focal liver abnormality.Cholecystectomy. No bile duct dilatation. Pancreas: Mild fat stranding around the head of pancreas and mid duodenum, but no duodenal wall thickening or clear pancreatic septal edema. Spleen: Unremarkable. Adrenals/Urinary Tract: Negative adrenals. No hydronephrosis or stone. Unremarkable bladder. Stomach/Bowel:  No obstruction. No evidence of bowel inflammation. Vascular/Lymphatic: No acute vascular abnormality. Scattered atherosclerotic calcifications. No mass or adenopathy. Reproductive:Pelvic floor laxity.  Question pelvic sling surgery. Other: Small volume perihepatic ascites.  Prominent anasarca Musculoskeletal: Severe and diffuse lumbar spine degeneration  with L4-5 anterolisthesis. Bilateral degenerative hip narrowing. IMPRESSION: 1. Mild fat stranding around the pancreas, likely from generalized volume overload but please correlate with serum enzymes. 2. Small volume ascites accumulated around the liver. 3. Cardiomegaly and atherosclerosis. Electronically Signed   By: Monte Fantasia M.D.   On: 01/17/2019 04:38   Dg Chest 1 View  Result Date: 01/17/2019 CLINICAL DATA:  Shortness of breath EXAM: CHEST  1 VIEW COMPARISON:  December 13, 2018 FINDINGS: There is stable cardiomegaly. Pulmonary vascular congestion and mild increased interstitial markings throughout both lungs. No focal airspace consolidation. Bilateral shoulder arthritis is seen. IMPRESSION: Cardiomegaly with pulmonary vascular congestion and interstitial edema. Electronically Signed   By: Prudencio Pair M.D.   On: 01/17/2019 00:55      Assessment & Plan: Pt is a 68 y.o.   female with  medical problems of chronic systolic congestive heart failure, coronary disease, atrial fibrillation requiring anticoagulation, diabetes, anemia, carotid artery disease with history of left carotid endarterectomy, previous stroke, hypertension, chronic kidney disease, was admitted on 01/17/2019 with shortness of breath worsening edema, acute kidney injury.   1.  Acute kidney injury 2.  Chronic kidney disease stage III 3.  Shortness of breath 4.  Generalized edema 5.  Severe anemia 6.  Diabetes, insulin-dependent.  Hemoglobin A1c 7.4% December 14, 2018 7.  Hematuria 8.  Proteinuria 9.  Severe chronic systolic congestive heart failure LVEF 25 to 30% 10.  Pulmonary hypertension 11.  Atrial fibrillation requiring anticoagulation 12.  Hyponatremia  Abdominal imaging so far shows diffuse lumbar spine degeneration, fat stranding around pancreas from generalized volume overload, ascites, cardiomegaly and atherosclerosis Chest x-ray consistent with pulmonary vascular congestion 2D echo from September 5 shows  severely reduced LVEF 20 to 30% mildly dilated left ventricular cavity, diffuse hypokinesis, moderately enlarged right ventricular cavity with elevated pulmonary pressures of 56 mm, severely dilated left atrium, moderately dilated right atrium, moderate tricuspid regurgitation  Acute kidney injury is likely secondary to cardiorenal syndrome.  Patient has relative hypotension which may be leading to renal hypoperfusion.  She has underlying chronic kidney disease secondary to diabetes and hypertension.  Patient is also noted to have severe chronic congestive heart failure and hypoalbuminemia.  Recommend: -Avoid hypotension, institute pressors or midodrine as necessary -Supplement IV albumin 12.5 g daily -Once blood pressure is better, consider IV furosemide infusion -We will obtain iron studies, SPEP, UPEP, ANA    LOS: 0 Miguelina Fore 10/9/20208:04 AM    Note: This note was prepared  with Sales executive. Any transcription errors are unintentional

## 2019-01-17 NOTE — Consult Note (Signed)
Name: Meghan [name] MRN: [id number] DOB: 11/12/1950    ADMISSION DATE:  01/17/2019 CONSULTATION DATE:  01/17/2019  REFERRING MD :  Rufina Falco, NP  CHIEF COMPLAINT:  Shortness of Breath  BRIEF PATIENT DESCRIPTION:  68 year old female admitted with acute hypoxic respiratory failure secondary to acute on chronic systolic CHF, acute blood loss anemia secondary to GI bleed, AKI on CKD, and right sided flank/back pain.   SIGNIFICANT EVENTS  10/9>>Admission to Stepdown 10/9>> GI, Nephrology, Cardiology consulted  STUDIES:  10/9 - CT Abdomen & Pelvis:1. Mild fat stranding around the pancreas, likely from generalized volume overload but please correlate with serum enzymes. 2. Small volume ascites accumulated around the liver. 3. Cardiomegaly and atherosclerosis.  10/9 - Venous Ultrasound LLE>>  CULTURES: SARS-CoV-2 PCR 10/9>> negative  ANTIBIOTICS: Ceftriaxone 10/9>>  HISTORY OF PRESENT ILLNESS:   Meghan [name] is a 68 year old female with a past medical history notable for chronic systolic CHF, CAD, persistent atrial fibrillation on Coumadin therapy, diabetes mellitus, chronic anemia, CKD, hyperlipidemia, carotid artery disease status post left carotid endarterectomy, previous CVA, and hypertension who presents to [facility] ED on 01/17/2019 with complaints of shortness of breath, rectal bleeding, and right flank pain.  She reports mild to moderate episodes of rectal bleeding for approximately 3 days with associated nausea and vomiting, and reports onset of right sided flank pain since yesterday evening.  She also endorses orthopnea, progressive lower extremity edema and weight gain.  She denies abdominal pain, diarrhea, fever, chills, chest pain, diaphoresis.  Upon presentation to the ED she was noted to be hypotensive with blood pressure 100/49, pulse 67 bpm, and afebrile.  Initial work-up in the ED reveals hemoglobin 6.7, BNP 790, high-sensitivity troponin 13, INR 2.2, creatinine 3.82, sodium 133.   Chest x-ray reveals pulmonary edema and cardiomegaly.  CT abdomen and pelvis reveals mild fat stranding around the pancreas and small volume ascites accumulated around the liver.  Urinalysis is currently pending.  She was given vitamin K and 40 mg IV Lasix, and is to receive 1 unit of packed red blood cells.  She is being admitted to stepdown unit by the hospitalist for further work-up and treatment of acute hypoxic respiratory failure in the setting of acute on chronic systolic CHF, acute blood loss anemia secondary to GI bleed, and right sided flank pain.  PCCM is consulted for further management.  PAST MEDICAL HISTORY :   has a past medical history of Carotid arterial disease ([place]), CKD stage G3b/A1, GFR 30-44 and albumin creatinine ratio <30 mg/g, Coronary artery disease, Diabetes mellitus without complication ([place]), Hyperlipidemia, Hypertension, Ischemic cardiomyopathy, Morbid obesity ([place]), Normocytic anemia, Persistent atrial fibrillation ([place]), and Stroke ([place]) (2013).  has a past surgical history that includes Leg Surgery; Cholecystectomy; CARDIOVERSION (N/A, 07/28/2016); CARDIOVERSION (N/A, 08/07/2016); Carotid endarterectomy; Tonsillectomy; Amputation toe (Left, 09/22/2016); RIGHT/LEFT HEART CATH AND CORONARY ANGIOGRAPHY (Bilateral, 03/12/2017); foot infection (Right); and Cardioversion (N/A, 01/03/2019). Prior to Admission medications   Medication Sig Start Date End Date Taking? Authorizing Provider  amiodarone (PACERONE) 200 MG tablet Take 200 mg by mouth 2 (two) times daily.   Yes [provider]  atorvastatin (LIPITOR) 40 MG tablet Take 1 tablet by mouth once daily Patient taking differently: Take 40 mg by mouth daily.  11/25/18  Yes Wellington Hampshire, MD  carvedilol (COREG) 6.25 MG tablet TAKE 1 TABLET BY MOUTH TWICE DAILY WITH A MEAL 01/06/19  Yes Wellington Hampshire, MD  glipiZIDE (GLUCOTROL) 10 MG tablet Take 10 mg by mouth 2 (two) times daily.  Dr Honor Junes 08/13/15  Yes [provider]  Insulin Degludec 200 UNIT/ML SOPN Inject 25 Units into the skin daily at 12 noon. Dr Honor Junes (1300) 02/18/18  Yes [provider]  levothyroxine (SYNTHROID) 50 MCG tablet Take 50 mcg by mouth daily before breakfast.   Yes [provider]  losartan (COZAAR) 25 MG tablet Take 1 tablet (25 mg total) by mouth daily. 12/15/18 12/15/19 Yes Mody, Ulice Bold, MD  torsemide (DEMADEX) 20 MG tablet Take 2 tablets (40 mg total) by mouth 2 (two) times daily. 01/09/19  Yes Wellington Hampshire, MD  triamcinolone cream (KENALOG) 0.5 % Apply 1 application topically 2 (two) times daily as needed (psoriasis).    Yes [provider]  warfarin (COUMADIN) 5 MG tablet USE AS DIRECTED BY COUMADIN CLINIC FOR 30 DAYS Patient not taking: Take one 5 mg tablet Sunday, Tuesday, Wednesday, Thursday, and Saturday. And one and one-half tablets 7.5 mg on Monday and Friday 11/25/18   Wellington Hampshire, MD   No Known Allergies  FAMILY HISTORY:  family history includes Diabetes in her maternal grandmother and mother; Heart disease in her father and mother; Hypertension in her father. SOCIAL HISTORY:  reports that she has quit smoking. Her smoking use included cigarettes. She has a 2.50 pack-year smoking history. She has never used smokeless tobacco. She reports that she does not drink alcohol or use drugs.   COVID-19 DISASTER DECLARATION:  FULL CONTACT PHYSICAL EXAMINATION WAS NOT POSSIBLE DUE TO TREATMENT OF COVID-19 AND  CONSERVATION OF PERSONAL PROTECTIVE EQUIPMENT, LIMITED EXAM FINDINGS INCLUDE-  Patient assessed or the symptoms described in the history of present illness.  In the context of the Global COVID-19 pandemic, which necessitated consideration that the patient might be at risk for infection with the SARS-CoV-2 virus that causes COVID-19, Institutional protocols and algorithms that pertain to the evaluation of patients at risk for COVID-19 are in a state of rapid change based on  information released by regulatory bodies including the CDC and federal and state organizations. These policies and algorithms were followed during the patient's care while in hospital.  REVIEW OF SYSTEMS: Positives in BOLD Constitutional: Negative for fever, chills, weight loss, +malaise/fatigue and diaphoresis.  HENT: Negative for hearing loss, ear pain, nosebleeds, congestion, sore throat, neck pain, tinnitus and ear discharge.   Eyes: Negative for blurred vision, double vision, photophobia, pain, discharge and redness.  Respiratory: Negative for cough, hemoptysis, sputum production, +shortness of breath, wheezing and stridor.   Cardiovascular: Negative for chest pain, palpitations, orthopnea, claudication, +leg swelling and PND.  Gastrointestinal: Negative for heartburn, nausea, vomiting, abdominal pain, diarrhea, constipation, +blood in stool and melena.  Genitourinary: Negative for dysuria, urgency, frequency, hematuria and +flank pain.  Musculoskeletal: Negative for myalgias, +back pain, joint pain and falls.  Skin: Negative for itching and rash.  Neurological: Negative for dizziness, tingling, tremors, sensory change, speech change, focal weakness, seizures, loss of consciousness, weakness and headaches.  Endo/Heme/Allergies: Negative for environmental allergies and polydipsia. Does not bruise/bleed easily.  SUBJECTIVE:  Patient reports generalized weakness and fatigue, right-sided flank pain Reports shortness of breath is improved Wants to drink water On 3 L nasal cannula  VITAL SIGNS: Temp:  [96.4 F (35.8 C)-97.5 F (36.4 C)] 96.4 F (35.8 C) (10/09 0439) Pulse Rate:  [67-80] 74 (10/09 0500) Resp:  [13-23] 13 (10/09 0500) BP: (100-132)/(49-77) 103/71 (10/09 0500) SpO2:  [84 %-100 %] 99 % (10/09 0500) Weight:  [120.2 kg-139 kg] 139 kg (10/09 0439)  PHYSICAL EXAMINATION: General: Acute  on chronically ill-appearing female, sitting in bed, on 3 L nasal cannula, complains of  right-sided flank pain, in no acute distress Neuro: Awake, alert and oriented x4, follows commands, no focal deficits, speech clear, pupils PERRLA HEENT: Atraumatic, normocephalic, neck supple, no JVD Cardiovascular: Irregularly irregular rhythm, rate controlled.  No murmurs rubs or gallops Lungs: Diminished breath sounds bilaterally with slight expiratory wheezing, even, nonlabored, normal effort, no accessory muscle use Abdomen: Obese, soft, nontender, no guarding or rebound tenderness, bowel sounds positive x4, right flank tenderness Musculoskeletal: Bilateral lower extremity edema with left greater than right Skin: Warm and dry, scattered ecchymosis throughout  Recent Labs  Lab 01/17/19 0011  NA 133*  K 5.0  CL 100  CO2 24  BUN 74*  CREATININE 3.82*  GLUCOSE 119*   Recent Labs  Lab 01/17/19 0011  HGB 6.7*  HCT 21.7*  WBC 6.6  PLT 193   Ct Abdomen Pelvis Wo Contrast  Result Date: 01/17/2019 CLINICAL DATA:  Right flank pain and leg weakness. EXAM: CT ABDOMEN AND PELVIS WITHOUT CONTRAST TECHNIQUE: Multidetector CT imaging of the abdomen and pelvis was performed following the standard protocol without IV contrast. COMPARISON:  None. FINDINGS: Lower chest:  Cardiomegaly with multifocal coronary atherosclerosis. Hepatobiliary: No focal liver abnormality.Cholecystectomy. No bile duct dilatation. Pancreas: Mild fat stranding around the head of pancreas and mid duodenum, but no duodenal wall thickening or clear pancreatic septal edema. Spleen: Unremarkable. Adrenals/Urinary Tract: Negative adrenals. No hydronephrosis or stone. Unremarkable bladder. Stomach/Bowel:  No obstruction. No evidence of bowel inflammation. Vascular/Lymphatic: No acute vascular abnormality. Scattered atherosclerotic calcifications. No mass or adenopathy. Reproductive:Pelvic floor laxity.  Question pelvic sling surgery. Other: Small volume perihepatic ascites.  Prominent anasarca Musculoskeletal: Severe and diffuse  lumbar spine degeneration with L4-5 anterolisthesis. Bilateral degenerative hip narrowing. IMPRESSION: 1. Mild fat stranding around the pancreas, likely from generalized volume overload but please correlate with serum enzymes. 2. Small volume ascites accumulated around the liver. 3. Cardiomegaly and atherosclerosis. Electronically Signed   By: Monte Fantasia M.D.   On: 01/17/2019 04:38   Dg Chest 1 View  Result Date: 01/17/2019 CLINICAL DATA:  Shortness of breath EXAM: CHEST  1 VIEW COMPARISON:  December 13, 2018 FINDINGS: There is stable cardiomegaly. Pulmonary vascular congestion and mild increased interstitial markings throughout both lungs. No focal airspace consolidation. Bilateral shoulder arthritis is seen. IMPRESSION: Cardiomegaly with pulmonary vascular congestion and interstitial edema. Electronically Signed   By: Prudencio Pair M.D.   On: 01/17/2019 00:55    ASSESSMENT / PLAN:  Acute hypoxic respiratory failure secondary to Decompensated HFrEF and severe anemia -Supplemental O2 as needed to maintain O2 sats greater than 92% -Follow intermittent ABG and chest x-ray as needed -IV Lasix as blood pressure and renal function permits -Transfuse as indicated -Cardiology consulted, appreciate input -LV EF 25-30% on Echo 12/2018  Acute on chronic systolic CHF Persistent atrial fibrillation LE edema, Left >right -Continuous cardiac monitoring -Maintain map greater than 65  -Levophed if needed to maintain MAP goal -IV Lasix as blood pressure and renal function permits -Cardiology consulted, appreciate input -Continue amiodarone -Coumadin on hold due to GI bleed -Obtain Venous Ultrasound of LLE  Acute GI bleed Acute blood loss anemia -Monitor for S/Sx of bleeding -Trend CBC -Follow H&H q6h -Transfuse for Hgb <8 -Protonix drip -NPO -GI consulted, appreciate input -Received vitamin K in ED to reverse Coumadin  AKI superimposed on CKD -Monitor I&O's / urinary output -Follow  BMP -Ensure adequate renal perfusion -Avoid nephrotoxic agents as able -Replace  electrolytes as indicated -Nephrology consulted, appreciate input  Acute Right Flank Pain -CT Abdomen/Pelvis with mild stranding around pancreas (likely from generalized volume overload) and with small volume ascites and cardiomegaly -Obtain STAT Lipase & Amylase -Check LFT's -UA pending -Will place on empiric Rocephin for now to cover for SBT & UTI until UA resulted and LFT's resulted  Diabetes mellitus -CBGs -Sliding scale insulin -Follow ICU hypo-hyperglycemia protocol -Hold home glipizide  Hypothyroidism -Continue home Synthroid      Disposition: Stepdown Goals of care: Full code VTE prophylaxis: SCDs (no chemical prophylaxis due to GI bleed) Updates: Updated patient at bedside 01/17/2019   Darel Hong, [facility] Pager: [phone] Cell: [phone]  01/17/2019, 5:49 AM

## 2019-01-17 NOTE — Consult Note (Signed)
MEDICATION RELATED CONSULT NOTE - INITIAL   Pharmacy Consult for Milrinone Indication: inotropic support  No Known Allergies  Patient Measurements: Height: 5\' 7"  (170.2 cm) Weight: (!) 306 lb 7 oz (139 kg) IBW/kg (Calculated) : 61.6  Vital Signs: Temp: 98 F (36.7 C) (10/09 0800) Temp Source: Oral (10/09 0800) BP: 93/66 (10/09 1030) Pulse Rate: 67 (10/09 1030)  Estimated Creatinine Clearance: 20 mL/min (A) (by C-G formula based on SCr of 3.82 mg/dL (H)).  Medical History: Past Medical History:  Diagnosis Date  . Carotid arterial disease ([address])    a. 02/2012 s/p L CEA.  . CKD stage G3b/A1, GFR 30-44 and albumin creatinine ratio <30 mg/g   . Coronary artery disease    a. 02/2012 Lexi MV: Anterior, anteroseptal, septal, apical infarct with mild peri-infarct ischemia, EF 41%- medically managed due to anemia; b. 03/2017 Cath: LM nl, LAD 99ost/p/m - faint L->L collaterals, RI 20ost, LCX 52m, RCA 20p, 60m-->Med Rx for chronic subtotal LAD dzs. Avoid R Radial access in future.  . 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 Hyperlipidemia   . Hypertension   . Ischemic cardiomyopathy    a. 06/2013 Echo: EF 35-40%, glob HK, mildly dil LA, mild LVH, mild TR, mild to mod MR; b. 07/2016 Echo: EF 25-30%, sev mid-apicalanteroseptal, ant, apical HK, mild MR, sev dil LA, mod dil RA, mod TR, PASP 80mmHg; c. 11/2016 Echo: EF 20-25%, antsept DK, Gr2 DD, mild MR, mod dil LA/RV/RA, mod red RV fxn, mod-sev TR, PASP 40-56mmHg.  . Morbid obesity ([place])   . Normocytic anemia   . Persistent atrial fibrillation (HCC)    a. CHA2DS2VASc = 7-->coumadin; maintaining sinus on amio.  . Stroke [facility]) 2013   Assessment: Meghan [name] is a 32 YOF who presented for evaluation of CHF. Patient has been in and out of atrial fibrillation since 2018, with a recent hospitalization in 12/2018 which she was found to have an EF of 30-35%, moderate pulmonary HTN with mildly reduced RV systolic function, and a severely  dilated left atrium.  She underwent cardioversion on 01/03/2019 which was briefly successful, but returned to atrial fibrillation at follow-up on 01/09/2019.  At this visit, she noted continuous weight gain, severe leg edema, and dyspnea. Her Lasix 40mg  PO BID was changed to torsemide 40mg  PO BID and remained on low-dose carvedilol and losartan.  Over the past week, patient has been more somnolent with continued lower extremity swelling and shortness of breath, prompting an ED visit this morning. She was found to be significantly volume overloaded and anemic. Due to presenting with hypotension and acute on chronic kidney disease, carvedilol and losartan were held. Patient was given 40mg  IV Lasic in ED with no document UOP. BP has remained soft in the 90s over 60s. Per cardiology, patient to be on milrinone for inotropic support for suspected cardiorenal syndrome.  Plan:  - Defer to CCM and cardiology for titration to achieve goals of therapy - Start on the low end of dosing (0.155mcg/kg/min), may be up-titrated to a recommended maximum dose of 0.33mcg/kg/min based on CrCl of 10-82mL/min.    Raiford Simmonds, PharmD Candidate 01/17/2019,2:17 PM

## 2019-01-17 NOTE — ED Provider Notes (Signed)
[facility] Emergency Department Provider Note  ____________________________________________   First MD Initiated Contact with Patient 01/17/19 0033     (approximate)  I have reviewed the triage vital signs and the nursing notes.   HISTORY  Chief Complaint Shortness of Breath  Level 5 caveat:  history/ROS limited by acute/critical illness  HPI Meghan [name] is a 68 y.o. female with extensive chronic medical history as listed below who presents for evaluation of gradually worsening shortness of breath over about a week.  She sees Dr. Fletcher Anon who recently switched her from furosemide to torsemide.  Since that time she has been putting on weight and her shortness of breath is been getting worse and worse.   She has had swelling of both legs and now they are weeping clear fluid.  Additionally over the last 2 days she has developed bright red blood from her rectum and is feeling generally weak and lightheaded.  She takes warfarin given a history of chronic atrial fibrillation and says that she had a small stroke in the past.  She denies fever/chills, sore throat, chest pain, shortness of breath, and contact with COVID-19 patients.  Overall her symptoms are severe including the pain in the middle of her back that is sharp and stabbing.  No prior history of kidney stones.  No dysuria.  Lying flat makes her breathing much worse and nothing in particular makes it better.        Past Medical History:  Diagnosis Date   Carotid arterial disease ([place])    a. 02/2012 s/p L CEA.   CKD stage G3b/A1, GFR 30-44 and albumin creatinine ratio <30 mg/g    Coronary artery disease    a. 02/2012 Lexi MV: Anterior, anteroseptal, septal, apical infarct with mild peri-infarct ischemia, EF 41%- medically managed due to anemia; b. 03/2017 Cath: LM nl, LAD 99ost/p/m - faint L->L collaterals, RI 20ost, LCX 63m, RCA 20p, 22m-->Med Rx for chronic subtotal LAD dzs. Avoid R Radial access in future.    Diabetes mellitus without complication ([place])    Type II   Hyperlipidemia    Hypertension    Ischemic cardiomyopathy    a. 06/2013 Echo: EF 35-40%, glob HK, mildly dil LA, mild LVH, mild TR, mild to mod MR; b. 07/2016 Echo: EF 25-30%, sev mid-apicalanteroseptal, ant, apical HK, mild MR, sev dil LA, mod dil RA, mod TR, PASP 41mmHg; c. 11/2016 Echo: EF 20-25%, antsept DK, Gr2 DD, mild MR, mod dil LA/RV/RA, mod red RV fxn, mod-sev TR, PASP 40-36mmHg.   Morbid obesity ([place])    Normocytic anemia    Persistent atrial fibrillation (HCC)    a. CHA2DS2VASc = 7-->coumadin; maintaining sinus on amio.   Stroke [facility]) 2013    Patient Active Problem List   Diagnosis Date Noted   Dilated cardiomyopathy ([place]) 12/15/2018   Hypoglycemia due to type 2 diabetes mellitus ([place]) 12/15/2018   Acute on chronic systolic CHF (congestive heart failure) ([place]) 12/13/2018   Sepsis ([place]) 03/30/2017   Elevated TSH 12/29/2016   Psoriasis 12/26/2016   Obesity, Class III, BMI 40-49.9 (morbid obesity) ([place]) 12/26/2016   Toe osteomyelitis, left ([place]) 09/21/2016   CKD (chronic kidney disease), stage III 07/26/2016   History of CVA (cerebrovascular accident) without residual deficits 09/03/2013   Hyperlipidemia 09/03/2013   Type 2 diabetes mellitus with peripheral neuropathy ([place]) 09/03/2013   Hyperlipidemia due to type 2 diabetes mellitus ([place]) 09/03/2013   Persistent atrial fibrillation 07/16/2013   Paroxysmal atrial fibrillation ([place]) 07/16/2013  Ischemic cardiomyopathy 07/16/2013   Hypertension [id number]   Chronic systolic heart failure ([place]) 04/12/2012   Coronary atherosclerosis    Dyspnea 03/01/2012    Past Surgical History:  Procedure Laterality Date   AMPUTATION TOE Left 09/22/2016   Procedure: AMPUTATION TOE;  Surgeon: Albertine Patricia, DPM;  Location: [facility] ORS;  Service: Podiatry;  Laterality: Left;   CARDIOVERSION N/A 07/28/2016   Procedure: CARDIOVERSION;  Surgeon: Wellington Hampshire, MD;  Location: [facility] ORS;  Service: Cardiovascular;  Laterality: N/A;   CARDIOVERSION N/A 08/07/2016   Procedure: CARDIOVERSION;  Surgeon: Wellington Hampshire, MD;  Location: [facility] ORS;  Service: Cardiovascular;  Laterality: N/A;   CARDIOVERSION N/A 01/03/2019   Procedure: CARDIOVERSION;  Surgeon: Minna Merritts, MD;  Location: [facility] ORS;  Service: Cardiovascular;  Laterality: N/A;   CAROTID ENDARTERECTOMY     [facility]; Dr. Lucky Cowboy   CHOLECYSTECTOMY     foot infection Right    LEG SURGERY     right;infection   RIGHT/LEFT HEART CATH AND CORONARY ANGIOGRAPHY Bilateral 03/12/2017   Procedure: RIGHT/LEFT HEART CATH AND CORONARY ANGIOGRAPHY;  Surgeon: Wellington Hampshire, MD;  Location: [place] [facility];  Service: Cardiovascular;  Laterality: Bilateral;   TONSILLECTOMY      Prior to Admission medications   Medication Sig Start Date End Date Taking? Authorizing Provider  amiodarone (PACERONE) 200 MG tablet Take 200 mg by mouth 2 (two) times daily.   Yes [provider]  atorvastatin (LIPITOR) 40 MG tablet Take 1 tablet by mouth once daily Patient taking differently: Take 40 mg by mouth daily.  11/25/18   Wellington Hampshire, MD  carvedilol (COREG) 6.25 MG tablet TAKE 1 TABLET BY MOUTH TWICE DAILY WITH A MEAL 01/06/19   Wellington Hampshire, MD  glipiZIDE (GLUCOTROL) 10 MG tablet Take 10 mg by mouth 2 (two) times daily. Dr Honor Junes 08/13/15   [provider]  Insulin Degludec 200 UNIT/ML SOPN Inject 25 Units into the skin daily at 12 noon. Dr Honor Junes (1300) 02/18/18   [provider]  levothyroxine (SYNTHROID) 50 MCG tablet Take 50 mcg by mouth daily before breakfast.    [provider]  losartan (COZAAR) 25 MG tablet Take 1 tablet (25 mg total) by mouth daily. 12/15/18 12/15/19  Bettey Costa, MD  torsemide (DEMADEX) 20 MG tablet Take 2 tablets (40 mg total) by mouth 2 (two) times daily. 01/09/19   Wellington Hampshire, MD  triamcinolone cream (KENALOG) 0.5 % Apply 1  application topically 2 (two) times daily as needed (psoriasis).     [provider]  warfarin (COUMADIN) 5 MG tablet USE AS DIRECTED BY COUMADIN CLINIC FOR 30 DAYS Patient not taking: Take one 5 mg tablet Sunday, Tuesday, Wednesday, Thursday, and Saturday. And one and one-half tablets 7.5 mg on Monday and Friday 11/25/18   Wellington Hampshire, MD    Allergies Patient has no known allergies.  Family History  Problem Relation Age of Onset   Heart disease Mother    Diabetes Mother    Heart disease Father    Hypertension Father    Diabetes Maternal Grandmother     Social History Social History   Tobacco Use   Smoking status: Former Smoker    Packs/day: 0.50    Years: 5.00    Pack years: 2.50    Types: Cigarettes   Smokeless tobacco: Never Used  Substance Use Topics   Alcohol use: No   Drug use: No    Review of Systems Level 5 caveat:  history/ROS limited by acute/critical illness  Constitutional: No fever/chills Eyes: No visual changes. ENT: No sore throat. Cardiovascular: Denies chest pain. Respiratory: +shortness of breath, + orthopnea Gastrointestinal: No abdominal pain.  No nausea, no vomiting.  No diarrhea.  No constipation. Genitourinary: Negative for dysuria. Musculoskeletal: Swelling in bilateral lower extremities with weeping fluid.  Negative for neck pain.  Severe pain in the middle of her back. Integumentary: Negative for rash. Neurological: Negative for headaches, focal weakness or numbness.   ____________________________________________   PHYSICAL EXAM:  VITAL SIGNS: ED Triage Vitals  Enc Vitals Group     BP 01/17/19 0000 (!) 100/49     Pulse Rate 01/17/19 0000 67     Resp 01/17/19 0000 (!) 22     Temp 01/17/19 0000 (!) 97.5 F (36.4 C)     Temp Source 01/17/19 0000 Oral     SpO2 01/17/19 0000 93 %     Weight 01/17/19 0001 120.2 kg (265 lb)     Height 01/17/19 0001 1.702 m (5\' 7" )     Head Circumference --      Peak Flow --       Pain Score 01/17/19 0007 6     Pain Loc --      Pain Edu? --      Excl. in [place]? --     Constitutional: Alert and oriented.  Ill-appearing but fully awake and alert and interactive with me. Eyes: Conjunctivae are normal.  Head: Atraumatic. Nose: No congestion/rhinnorhea. Mouth/Throat: Mucous membranes are dry. Neck: No stridor.  No meningeal signs.   Cardiovascular: Normal rate, regular rhythm. Good peripheral circulation. Grossly normal heart sounds. Respiratory: Increased respiratory effort.  Accessory muscle usage and intercostal muscle retractions.  Sitting straight up in bed, unable to tolerate and even partially recumbent position.  Coarse breath sounds but limited by body habitus. Gastrointestinal: Morbidly obese.  Soft and nontender. No distention.  Musculoskeletal: 2+ pitting edema bilateral lower extremities with chronic skin changes.  No evidence of cellulitis. No gross deformities of extremities. Neurologic:  Normal speech and language. No gross focal neurologic deficits are appreciated.  Skin:  Skin is warm, dry and intact except for some chronic skin changes in her lower extremities associated with what appears to be chronic peripheral edema, now with some weeping of clear fluid. Psychiatric: Mood and affect are normal. Speech and behavior are normal.  ____________________________________________   LABS (all labs ordered are listed, but only abnormal results are displayed)  Labs Reviewed  CBC WITH DIFFERENTIAL/PLATELET - Abnormal; Notable for the following components:      Result Value   RBC 2.65 (*)    Hemoglobin 6.7 (*)    HCT 21.7 (*)    MCH 25.3 (*)    RDW 18.9 (*)    Lymphs Abs 0.5 (*)    Abs Immature Granulocytes 0.10 (*)    All other components within normal limits  COMPREHENSIVE METABOLIC PANEL - Abnormal; Notable for the following components:   Sodium 133 (*)    Glucose, Bld 119 (*)    BUN 74 (*)    Creatinine, Ser 3.82 (*)    Calcium 7.7 (*)    Albumin  2.5 (*)    AST 42 (*)    Total Bilirubin 1.3 (*)    GFR calc non Af Amer 11 (*)    GFR calc Af Amer 13 (*)    All other components within normal limits  BRAIN NATRIURETIC PEPTIDE - Abnormal; Notable for the following components:  B Natriuretic Peptide 790.0 (*)    All other components within normal limits  PROTIME-INR - Abnormal; Notable for the following components:   Prothrombin Time 24.3 (*)    INR 2.2 (*)    All other components within normal limits  SARS CORONAVIRUS 2 BY RT PCR (HOSPITAL ORDER, [place])  URINE CULTURE  URINALYSIS, COMPLETE (UACMP) WITH MICROSCOPIC  TYPE AND SCREEN  PREPARE RBC (CROSSMATCH)  ABO/RH  TROPONIN I (HIGH SENSITIVITY)   ____________________________________________  EKG  ED ECG REPORT I, Hinda Kehr, the attending physician, personally viewed and interpreted this ECG.  Date: 01/16/2019 EKG Time: 23: 59 Rate: 75 Rhythm: Atrial fibrillation QRS Axis: normal Intervals: normal ST/T Wave abnormalities: Non-specific ST segment / T-wave changes, but no clear evidence of acute ischemia. Narrative Interpretation: no definitive evidence of acute ischemia; does not meet STEMI criteria.   ____________________________________________  RADIOLOGY I, Hinda Kehr, personally viewed and evaluated these images (plain radiographs) as part of my medical decision making, as well as reviewing the written report by the radiologist.  ED MD interpretation: Interstitial edema and cardiomegaly  Official radiology report(s): Dg Chest 1 View  Result Date: 01/17/2019 CLINICAL DATA:  Shortness of breath EXAM: CHEST  1 VIEW COMPARISON:  December 13, 2018 FINDINGS: There is stable cardiomegaly. Pulmonary vascular congestion and mild increased interstitial markings throughout both lungs. No focal airspace consolidation. Bilateral shoulder arthritis is seen. IMPRESSION: Cardiomegaly with pulmonary vascular congestion and interstitial edema.  Electronically Signed   By: Prudencio Pair M.D.   On: 01/17/2019 00:55    ____________________________________________   PROCEDURES   Procedure(s) performed (including Critical Care):  .Critical Care Performed by: Hinda Kehr, MD Authorized by: Hinda Kehr, MD   Critical care provider statement:    Critical care time (minutes):  40   Critical care time was exclusive of:  Separately billable procedures and treating other patients   Critical care was necessary to treat or prevent imminent or life-threatening deterioration of the following conditions:  Cardiac failure, respiratory failure and renal failure   Critical care was time spent personally by me on the following activities:  Development of treatment plan with patient or surrogate, discussions with consultants, evaluation of patient's response to treatment, examination of patient, obtaining history from patient or surrogate, ordering and performing treatments and interventions, ordering and review of laboratory studies, ordering and review of radiographic studies, pulse oximetry, re-evaluation of patient's condition and review of old charts     ____________________________________________   INITIAL IMPRESSION / MDM / [place] / ED COURSE  As part of my medical decision making, I reviewed the following data within the electronic MEDICAL RECORD NUMBER History obtained from family, Nursing notes reviewed and incorporated, Labs reviewed , EKG interpreted , Old chart reviewed, Radiograph reviewed , Discussed with admitting physician  and Notes from prior ED visits   Differential diagnosis includes, but is not limited to, acute CHF exacerbation, acute electrolyte or metabolic abnormality, acute infection of anyone of a number of sources, renal stones, much less likely acute issue of her spine such as osteomyelitis or discitis.  The patient is obviously volume overloaded which is her primary issue tonight.  BNP is elevated at  greater than 700.  She is volume overloaded on x-ray and on physical exam and this is likely due to the recent medication change.  I have ordered furosemide 40 mg IV to begin the diuresis.  However her metabolic panel is not back yet and she appears  dry based on her mucous membranes; I suspect her intravascular volume is depleted and she may have a degree of renal failure.  She is on warfarin but she is having acute rectal bleeding and her hemoglobin has dropped down to below 7 which is significantly abnormal for her.  Reversing her anticoagulation is a risk given her chronic atrial fibrillation but she is having abnormal bleeding.  Once I have the full picture with her labs I will discuss this with her but I suspect she may benefit from some vitamin K and will likely need blood products in spite of her volume overload.  She is quite critically ill with a number of somewhat contradictory and conflicting conditions that will make treatment difficult and she will need inpatient treatment.  Her respiratory issues are almost certainly due to CHF but given the complexity of the case I am sending the rapid COVID-19 swab so we have this much information as possible about how to treat her.  Daughter is in agreement with the plan as is the patient.  I am also giving her morphine 2 mg IV and Zofran 4 mg IV to help with her discomfort.      Clinical Course as of Jan 17 455  Fri Jan 17, 2019  0200 Troponin I (High Sensitivity): 13 [CF]  [phone] Labs are notable for acute on chronic renal failure with a creatinine of 3.8 which is up from her baseline of something around 2.  INR is 2.2.   [CF]  0242 After careful consideration, I had an extended conversation with the patient and her daughter about the risk of stroke versus the risk of life-threatening bleed.  Given that she has dropped her hemoglobin by about 4 points and only about 2 days of rectal bleeding, I think that she would benefit from vitamin K 2.5 mg p.o.  to  reverse her INR because I think the current risk of bleeding is greater than the possible risk of thrombosis.  I explained all this to the daughter and the patient explained that I cannot promise she will not have a stroke but the bleeding is been more critical issue at this time.  They understand and agree.  I had my usual and customary risks and benefits about blood transfusions with her and she agreed with the plan for a unit of blood administration to be given once she is upstairs.   [CF]  [id number] I ordered the vitamin K 2.5 mg by mouth.  I discussed the case in person with Rufina Falco with the hospitalist service.  She understands the complexity of the case and the number of complicating issues such as the patient's CHF volume overload together with the acute on chronic renal failure, GI bleeding on warfarin, chronic A. fib and prior CVA.  The patient will be admitted to stepdown for careful monitoring.   [CF]  0244 Of note, the patient is still having severe back pain.  I suspect this is related to her volume overload and pulmonary edema in the position in which she must sit given her morbid obesity and orthopnea.  I would get a CT scan to look for any other acute abnormalities but she cannot even begin to tolerate lying flat at this point.  I am giving her another morphine 4 mg IV and she may benefit from additional imaging once her more acute issues have been addressed.   [CF]  0248 SARS Coronavirus 2: NEGATIVE [CF]    Clinical Course User Index [CF]  Hinda Kehr, MD     ____________________________________________  FINAL CLINICAL IMPRESSION(S) / ED DIAGNOSES  Final diagnoses:  Acute on chronic congestive heart failure, unspecified heart failure type ([place])  Anemia, unspecified type  Rectal bleeding  Acute renal failure superimposed on chronic kidney disease, unspecified CKD stage, unspecified acute renal failure type ([place])  Chronic atrial fibrillation (HCC)  Warfarin anticoagulation    Severe back pain  Anasarca associated with disorder of kidney     MEDICATIONS GIVEN DURING THIS VISIT:  Medications  0.9 %  sodium chloride infusion (has no administration in time range)  morphine 4 MG/ML injection 4 mg (has no administration in time range)  phytonadione (VITAMIN K) tablet 2.5 mg (has no administration in time range)  morphine 2 MG/ML injection 2 mg (2 mg Intravenous Given 01/17/19 0137)  ondansetron (ZOFRAN) injection 4 mg (4 mg Intravenous Given 01/17/19 0137)  furosemide (LASIX) injection 40 mg (40 mg Intravenous Given 01/17/19 [id number])     ED Discharge Orders    None      *Please note:  Meghan [name] was evaluated in Emergency Department on 01/17/2019 for the symptoms described in the history of present illness. She was evaluated in the context of the global COVID-19 pandemic, which necessitated consideration that the patient might be at risk for infection with the SARS-CoV-2 virus that causes COVID-19. Institutional protocols and algorithms that pertain to the evaluation of patients at risk for COVID-19 are in a state of rapid change based on information released by regulatory bodies including the CDC and federal and state organizations. These policies and algorithms were followed during the patient's care in the ED.  Some ED evaluations and interventions may be delayed as a result of limited staffing during the pandemic.*  Note:  This document was prepared using Dragon voice recognition software and may include unintentional dictation errors.   Hinda Kehr, MD 01/17/19 [phone]

## 2019-01-17 NOTE — Progress Notes (Signed)
Inpatient Diabetes Program Recommendations  AACE/ADA: New Consensus Statement on Inpatient Glycemic Control (2015)  Target Ranges:  Prepandial:   less than 140 mg/dL      Peak postprandial:   less than 180 mg/dL (1-2 hours)      Critically ill patients:  140 - 180 mg/dL   Lab Results  Component Value Date   GLUCAP 62 (L) 01/17/2019   HGBA1C 7.4 (H) 12/14/2018    Review of Glycemic Control Results for DINETTA, REDDIG (MRN [id number]) as of 01/17/2019 12:26  Ref. Range 01/17/2019 04:38 01/17/2019 08:39 01/17/2019 11:37  Glucose-Capillary Latest Ref Range: 70 - 99 mg/dL 69 (L) 38 (LL) 62 (L)   Diabetes history: DM2 Outpatient Diabetes medications: Glucotrol 10 mg bid + Tresiba 25 units daily Current orders for Inpatient glycemic control: Novolog sensitive correction tid + hs 0-5 units  Inpatient Diabetes Program Recommendations:   Noted patient had hypoglycemia after admission without additional insulin administered. Please evaluate Glucotrol patient is on @ discharge and may need to decrease on discontinue along with adjustment of Tresiba. Will follow during hospitalization.  Thank you, Nani Gasser. Shanan Mcmiller, RN, MSN, CDE  Diabetes Coordinator Inpatient Glycemic Control Team Team Pager [phone] (8am-5pm) 01/17/2019 12:30 PM

## 2019-01-18 DIAGNOSIS — K625 Hemorrhage of anus and rectum: Secondary | ICD-10-CM | POA: Diagnosis not present

## 2019-01-18 DIAGNOSIS — I5023 Acute on chronic systolic (congestive) heart failure: Secondary | ICD-10-CM | POA: Diagnosis not present

## 2019-01-18 LAB — CBC
HCT: 21.9 % — ABNORMAL LOW (ref 36.0–46.0)
HCT: 23.1 % — ABNORMAL LOW (ref 36.0–46.0)
Hemoglobin: 7 g/dL — ABNORMAL LOW (ref 12.0–15.0)
Hemoglobin: 7.2 g/dL — ABNORMAL LOW (ref 12.0–15.0)
MCH: 25.9 pg — ABNORMAL LOW (ref 26.0–34.0)
MCH: 26.3 pg (ref 26.0–34.0)
MCHC: 31.2 g/dL (ref 30.0–36.0)
MCHC: 32 g/dL (ref 30.0–36.0)
MCV: 82.3 fL (ref 80.0–100.0)
MCV: 83.1 fL (ref 80.0–100.0)
Platelets: 185 10*3/uL (ref 150–400)
Platelets: 196 10*3/uL (ref 150–400)
RBC: 2.66 MIL/uL — ABNORMAL LOW (ref 3.87–5.11)
RBC: 2.78 MIL/uL — ABNORMAL LOW (ref 3.87–5.11)
RDW: 18.5 % — ABNORMAL HIGH (ref 11.5–15.5)
RDW: 18.6 % — ABNORMAL HIGH (ref 11.5–15.5)
WBC: 6.2 10*3/uL (ref 4.0–10.5)
WBC: 6.9 10*3/uL (ref 4.0–10.5)
nRBC: 0 % (ref 0.0–0.2)
nRBC: 0.3 % — ABNORMAL HIGH (ref 0.0–0.2)

## 2019-01-18 LAB — GLUCOSE, CAPILLARY
Glucose-Capillary: 64 mg/dL — ABNORMAL LOW (ref 70–99)
Glucose-Capillary: 128 mg/dL — ABNORMAL HIGH (ref 70–99)
Glucose-Capillary: 68 mg/dL — ABNORMAL LOW (ref 70–99)
Glucose-Capillary: 152 mg/dL — ABNORMAL HIGH (ref 70–99)
Glucose-Capillary: 183 mg/dL — ABNORMAL HIGH (ref 70–99)
Glucose-Capillary: 193 mg/dL — ABNORMAL HIGH (ref 70–99)

## 2019-01-18 LAB — HEMOGLOBIN AND HEMATOCRIT, BLOOD
HCT: 21.5 % — ABNORMAL LOW (ref 36.0–46.0)
Hemoglobin: 6.7 g/dL — ABNORMAL LOW (ref 12.0–15.0)

## 2019-01-18 LAB — PHOSPHORUS: Phosphorus: 5.2 mg/dL — ABNORMAL HIGH (ref 2.5–4.6)

## 2019-01-18 LAB — BASIC METABOLIC PANEL
Anion gap: 11 (ref 5–15)
BUN: 71 mg/dL — ABNORMAL HIGH (ref 8–23)
CO2: 24 mmol/L (ref 22–32)
Calcium: 7.8 mg/dL — ABNORMAL LOW (ref 8.9–10.3)
Chloride: 101 mmol/L (ref 98–111)
Creatinine, Ser: 3.49 mg/dL — ABNORMAL HIGH (ref 0.44–1.00)
GFR calc Af Amer: 15 mL/min — ABNORMAL LOW (ref >60)
GFR calc non Af Amer: 13 mL/min — ABNORMAL LOW (ref >60)
Glucose, Bld: 70 mg/dL (ref 70–99)
Potassium: 4.6 mmol/L (ref 3.5–5.1)
Sodium: 136 mmol/L (ref 135–145)

## 2019-01-18 LAB — C3 COMPLEMENT: C3 Complement: 98 mg/dL (ref 82–167)

## 2019-01-18 LAB — C4 COMPLEMENT: Complement C4, Body Fluid: 23 mg/dL (ref 14–44)

## 2019-01-18 LAB — MAGNESIUM: Magnesium: 2.3 mg/dL (ref 1.7–2.4)

## 2019-01-18 LAB — FOLATE: Folate: 9.8 ng/mL (ref >5.9)

## 2019-01-18 MED ORDER — POLYETHYLENE GLYCOL 3350 17 G PO PACK
17.0000 g | PACK | Freq: Every day | ORAL | Status: DC | PRN
  Administered 2019-01-28: 04:00:00 17 g via ORAL
  Filled 2019-01-18: qty 1

## 2019-01-18 MED ORDER — CHLORHEXIDINE GLUCONATE CLOTH 2 % EX PADS
6.0000 | MEDICATED_PAD | Freq: Every day | CUTANEOUS | Status: DC
  Administered 2019-01-19 – 2019-02-07 (×18): 6 via TOPICAL

## 2019-01-18 MED ORDER — FENTANYL CITRATE (PF) 100 MCG/2ML IJ SOLN
12.5000 ug | Freq: Once | INTRAMUSCULAR | Status: AC
  Administered 2019-01-18: 12.5 ug via INTRAVENOUS
  Filled 2019-01-18: qty 2

## 2019-01-18 MED ORDER — HYDROCODONE-ACETAMINOPHEN 5-325 MG PO TABS
1.0000 | ORAL_TABLET | Freq: Once | ORAL | Status: AC
  Administered 2019-01-18: 1 via ORAL
  Filled 2019-01-18: qty 1

## 2019-01-18 MED ORDER — HYDROCODONE-ACETAMINOPHEN 5-325 MG PO TABS
1.0000 | ORAL_TABLET | Freq: Four times a day (QID) | ORAL | Status: DC | PRN
  Administered 2019-01-18: 1 via ORAL
  Administered 2019-01-18 – 2019-01-20 (×4): 2 via ORAL
  Administered 2019-01-22 – 2019-01-23 (×2): 1 via ORAL
  Administered 2019-01-24 – 2019-02-07 (×9): 2 via ORAL
  Filled 2019-01-18 (×6): qty 2
  Filled 2019-01-18: qty 1
  Filled 2019-01-18 (×3): qty 2
  Filled 2019-01-18: qty 1
  Filled 2019-01-18 (×4): qty 2
  Filled 2019-01-18: qty 1
  Filled 2019-01-18: qty 2

## 2019-01-18 NOTE — Progress Notes (Addendum)
[facility] [place] NAME: Meghan [name]    MR#:  [id number]  DATE OF BIRTH:  December 23, 1950  SUBJECTIVE:  CHIEF COMPLAINT:   Chief Complaint  Patient presents with  . Shortness of Breath   Still has SOB and weakness.  REVIEW OF SYSTEMS:    Review of Systems  Constitutional: Positive for malaise/fatigue. Negative for chills and fever.  HENT: Negative for sore throat.   Eyes: Negative for blurred vision, double vision and pain.  Respiratory: Positive for shortness of breath. Negative for cough, hemoptysis and wheezing.   Cardiovascular: Positive for leg swelling. Negative for chest pain, palpitations and orthopnea.  Gastrointestinal: Negative for abdominal pain, constipation, diarrhea, heartburn, nausea and vomiting.  Genitourinary: Negative for dysuria and hematuria.  Musculoskeletal: Negative for back pain and joint pain.  Skin: Negative for rash.  Neurological: Negative for sensory change, speech change, focal weakness and headaches.  Endo/Heme/Allergies: Does not bruise/bleed easily.  Psychiatric/Behavioral: Negative for depression. The patient is not nervous/anxious.     DRUG ALLERGIES:  No Known Allergies  VITALS:  Blood pressure 99/60, pulse 88, temperature 97.9 F (36.6 C), temperature source Oral, resp. rate 15, height 5\' 7"  (1.702 m), weight (!) 139 kg, SpO2 99 %.  PHYSICAL EXAMINATION:   Physical Exam  GENERAL:  68 y.o.-year-old patient lying in the bed with no acute distress.  EYES: Pupils equal, round, reactive to light and accommodation. No scleral icterus. Extraocular muscles intact.  HEENT: Head atraumatic, normocephalic. Oropharynx and nasopharynx clear.  NECK:  Supple, no jugular venous distention. No thyroid enlargement, no tenderness.  LUNGS: Normal breath sounds bilaterally, no wheezing, rales, rhonchi. No use of accessory muscles of respiration.  CARDIOVASCULAR: S1, S2 normal. No murmurs, rubs, or gallops.   ABDOMEN: Soft, nontender, nondistended. Bowel sounds present. No organomegaly or mass.  EXTREMITIES: No cyanosis, clubbing . LE edema NEUROLOGIC: Cranial nerves II through XII are intact. No focal Motor or sensory deficits b/l.   PSYCHIATRIC: The patient is alert and awake SKIN: No obvious rash, lesion, or ulcer.   LABORATORY PANEL:   CBC Recent Labs  Lab 01/18/19 0654 01/18/19 1025  WBC 6.9  --   HGB 7.0* 6.7*  HCT 21.9* 21.5*  PLT 185  --    ------------------------------------------------------------------------------------------------------------------ Chemistries  Recent Labs  Lab 01/17/19 1305 01/18/19 0654  NA  --  136  K  --  4.6  CL  --  101  CO2  --  24  GLUCOSE  --  70  BUN  --  71*  CREATININE  --  3.49*  CALCIUM  --  7.8*  MG  --  2.3  AST 42*  --   ALT 23  --   ALKPHOS 79  --   BILITOT 1.2  --    ------------------------------------------------------------------------------------------------------------------  Cardiac Enzymes No results for input(s): TROPONINI in the last 168 hours. ------------------------------------------------------------------------------------------------------------------  RADIOLOGY:  Ct Abdomen Pelvis Wo Contrast  Result Date: 01/17/2019 CLINICAL DATA:  Right flank pain and leg weakness. EXAM: CT ABDOMEN AND PELVIS WITHOUT CONTRAST TECHNIQUE: Multidetector CT imaging of the abdomen and pelvis was performed following the standard protocol without IV contrast. COMPARISON:  None. FINDINGS: Lower chest:  Cardiomegaly with multifocal coronary atherosclerosis. Hepatobiliary: No focal liver abnormality.Cholecystectomy. No bile duct dilatation. Pancreas: Mild fat stranding around the head of pancreas and mid duodenum, but no duodenal wall thickening or clear pancreatic septal edema. Spleen: Unremarkable. Adrenals/Urinary Tract: Negative adrenals. No hydronephrosis or stone. Unremarkable bladder. Stomach/Bowel:  No obstruction. No  evidence of bowel inflammation. Vascular/Lymphatic: No acute vascular abnormality. Scattered atherosclerotic calcifications. No mass or adenopathy. Reproductive:Pelvic floor laxity.  Question pelvic sling surgery. Other: Small volume perihepatic ascites.  Prominent anasarca Musculoskeletal: Severe and diffuse lumbar spine degeneration with L4-5 anterolisthesis. Bilateral degenerative hip narrowing. IMPRESSION: 1. Mild fat stranding around the pancreas, likely from generalized volume overload but please correlate with serum enzymes. 2. Small volume ascites accumulated around the liver. 3. Cardiomegaly and atherosclerosis. Electronically Signed   By: Monte Fantasia M.D.   On: 01/17/2019 04:38   Dg Chest 1 View  Result Date: 01/17/2019 CLINICAL DATA:  Shortness of breath EXAM: CHEST  1 VIEW COMPARISON:  December 13, 2018 FINDINGS: There is stable cardiomegaly. Pulmonary vascular congestion and mild increased interstitial markings throughout both lungs. No focal airspace consolidation. Bilateral shoulder arthritis is seen. IMPRESSION: Cardiomegaly with pulmonary vascular congestion and interstitial edema. Electronically Signed   By: Prudencio Pair M.D.   On: 01/17/2019 00:55   US Venous Img Lower Unilateral Left  Result Date: 01/17/2019 CLINICAL DATA:  Left lower extremity edema for the past month. Former smoker. Patient is on anticoagulation. Evaluate for DVT. EXAM: LEFT LOWER EXTREMITY VENOUS DOPPLER ULTRASOUND TECHNIQUE: Gray-scale sonography with graded compression, as well as color Doppler and duplex ultrasound were performed to evaluate the lower extremity deep venous systems from the level of the common femoral vein and including the common femoral, femoral, profunda femoral, popliteal and calf veins including the posterior tibial, peroneal and gastrocnemius veins when visible. The superficial great saphenous vein was also interrogated. Spectral Doppler was utilized to evaluate flow at rest and with distal  augmentation maneuvers in the common femoral, femoral and popliteal veins. COMPARISON:  None. FINDINGS: Examination is degraded due to patient body habitus and poor sonographic window. Contralateral Common Femoral Vein: Respiratory phasicity is normal and symmetric with the symptomatic side. No evidence of thrombus. Normal compressibility. Common Femoral Vein: No evidence of thrombus. Normal compressibility, respiratory phasicity and response to augmentation. Saphenofemoral Junction: No evidence of thrombus. Normal compressibility and flow on color Doppler imaging. Profunda Femoral Vein: No evidence of thrombus. Normal compressibility and flow on color Doppler imaging. Femoral Vein: No evidence of thrombus. Normal compressibility, respiratory phasicity and response to augmentation. Popliteal Vein: No evidence of thrombus. Normal compressibility, respiratory phasicity and response to augmentation. Calf Veins: No evidence of thrombus. Normal compressibility and flow on color Doppler imaging. Superficial Great Saphenous Vein: No evidence of thrombus. Normal compressibility. Venous Reflux:  None. Other Findings: Note is made of an approximately 5.9 x 2.6 x 1.5 cm serpiginous fluid collection with left popliteal fossa compatible with a Baker cyst, similar to the 09/2018 examination, previously, 6.5 x 2.4 x 1.6 cm. Minimal subcutaneous edema is seen at the level the left calf. IMPRESSION: 1. No evidence of DVT within left lower extremity. 2. 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 Electronically Signed   By: Sandi Mariscal M.D.   On: 01/17/2019 16:41     ASSESSMENT AND PLAN:   68 y.o. female with pertinent past medical history of chronic systolic heart failure CAD, persistent atrial fibrillation s/p cardioversion on Coumadin, diabetes mellitus, chronic anemia, CKD, hyperlipidemia, carotid artery disease s/p left carotid endarterectomy with previous CVA, and hypertension into the ED with chief  complaints of right flank pain, shortness of breath and rectal bleed  * Acute on chronicsystolicCongestive Heart Failure Last Echo 12/2018 with severely reduced LV EF 20-35% - Hold Coreg and losartan due to hypotension  on presentation - IV lasix -CardiologyConsulted  * GI Bleed-patient presenting with rectal bleed found to have hemoglobin 6.7 dropped from 10.3 - Status post vitamin K to reverse Coumadin Transfused but Hb trending down again GI consulted and following  * AKI on CKD 3  Likely cardiorenal syndrome Nephrology consulted Monitor  * Persistent atrial fibrillation - s/p cardioversion still in A. Fib - Continue amiodarone per cardiology - Hold Coumadin for now - We will re-dose per pharmacy - Continue to monitor INR  * HTN  -Hold Antihypertensive due to hypotension   * Diabetes mellitus - Recent hgb A1c 7.2 - Hold Glipizide - SSI  * Hypothyroidism -continue levothyroxine  * History of CVA without residual deficit  -Holding Coumadin for now in the setting of active GI bleed.  All the records are reviewed and case discussed with Care Management/Social Worker Management plans discussed with the patient, family and they are in agreement.  CODE STATUS: FULL CODE  TOTAL TIME TAKING CARE OF THIS PATIENT: 35 minutes.   POSSIBLE D/C IN 1-2 DAYS, DEPENDING ON CLINICAL CONDITION.  Meghan [name] M.D on 01/18/2019 at 1:08 PM  Between 7am to 6pm - Pager - [phone]  After 6pm go to [email or web address] - password [email or web address] [facility]  Office  [phone]  CC: Primary care physician; [name], MD  Note: This dictation was prepared with Dragon dictation along with smaller phrase technology. Any transcriptional errors that result from this process are unintentional.

## 2019-01-18 NOTE — Progress Notes (Signed)
[facility] [place] NAME: Leniyah Beto    MR#:  [id number]  DATE OF BIRTH:  [birth year]  SUBJECTIVE:  CHIEF COMPLAINT:   Chief Complaint  Patient presents with  . Shortness of Breath   No frank bleeding Daughter at bedside. Patient tolerating diet  REVIEW OF SYSTEMS:    Review of Systems  Constitutional: Positive for malaise/fatigue. Negative for chills and fever.  HENT: Negative for sore throat.   Eyes: Negative for blurred vision, double vision and pain.  Respiratory: Positive for shortness of breath. Negative for cough, hemoptysis and wheezing.   Cardiovascular: Positive for leg swelling. Negative for chest pain, palpitations and orthopnea.  Gastrointestinal: Negative for abdominal pain, constipation, diarrhea, heartburn, nausea and vomiting.  Genitourinary: Negative for dysuria and hematuria.  Musculoskeletal: Negative for back pain and joint pain.  Skin: Negative for rash.  Neurological: Negative for sensory change, speech change, focal weakness and headaches.  Endo/Heme/Allergies: Does not bruise/bleed easily.  Psychiatric/Behavioral: Negative for depression. The patient is not nervous/anxious.     DRUG ALLERGIES:  No Known Allergies  VITALS:  Blood pressure 99/60, pulse 88, temperature 97.9 F (36.6 C), temperature source Oral, resp. rate 15, height 5\' 7"  (1.702 m), weight (!) 139 kg, SpO2 99 %.  PHYSICAL EXAMINATION:   Physical Exam  GENERAL:  68 y.o.-year-old patient lying in the bed with no acute distress.  EYES: Pupils equal, round, reactive to light and accommodation. No scleral icterus. Extraocular muscles intact.  HEENT: Head atraumatic, normocephalic. Oropharynx and nasopharynx clear.  NECK:  Supple, no jugular venous distention. No thyroid enlargement, no tenderness.  LUNGS: Normal breath sounds bilaterally, no wheezing, rales, rhonchi. No use of accessory muscles of respiration.  CARDIOVASCULAR: S1, S2 normal.  No murmurs, rubs, or gallops.  ABDOMEN: Soft, nontender, nondistended. Bowel sounds present. No organomegaly or mass.  EXTREMITIES: No cyanosis, clubbing . LE edema NEUROLOGIC: Cranial nerves II through XII are intact. No focal Motor or sensory deficits b/l.   PSYCHIATRIC: The patient is alert and awake SKIN: No obvious rash, lesion, or ulcer.   LABORATORY PANEL:   CBC Recent Labs  Lab 01/18/19 0654 01/18/19 1025  WBC 6.9  --   HGB 7.0* 6.7*  HCT 21.9* 21.5*  PLT 185  --    ------------------------------------------------------------------------------------------------------------------ Chemistries  Recent Labs  Lab 01/17/19 1305 01/18/19 0654  NA  --  136  K  --  4.6  CL  --  101  CO2  --  24  GLUCOSE  --  70  BUN  --  71*  CREATININE  --  3.49*  CALCIUM  --  7.8*  MG  --  2.3  AST 42*  --   ALT 23  --   ALKPHOS 79  --   BILITOT 1.2  --    ------------------------------------------------------------------------------------------------------------------  Cardiac Enzymes No results for input(s): TROPONINI in the last 168 hours. ------------------------------------------------------------------------------------------------------------------  RADIOLOGY:  Ct Abdomen Pelvis Wo Contrast  Result Date: 01/17/2019 CLINICAL DATA:  Right flank pain and leg weakness. EXAM: CT ABDOMEN AND PELVIS WITHOUT CONTRAST TECHNIQUE: Multidetector CT imaging of the abdomen and pelvis was performed following the standard protocol without IV contrast. COMPARISON:  None. FINDINGS: Lower chest:  Cardiomegaly with multifocal coronary atherosclerosis. Hepatobiliary: No focal liver abnormality.Cholecystectomy. No bile duct dilatation. Pancreas: Mild fat stranding around the head of pancreas and mid duodenum, but no duodenal wall thickening or clear pancreatic septal edema. Spleen: Unremarkable. Adrenals/Urinary Tract: Negative adrenals. No hydronephrosis or  stone. Unremarkable bladder.  Stomach/Bowel:  No obstruction. No evidence of bowel inflammation. Vascular/Lymphatic: No acute vascular abnormality. Scattered atherosclerotic calcifications. No mass or adenopathy. Reproductive:Pelvic floor laxity.  Question pelvic sling surgery. Other: Small volume perihepatic ascites.  Prominent anasarca Musculoskeletal: Severe and diffuse lumbar spine degeneration with L4-5 anterolisthesis. Bilateral degenerative hip narrowing. IMPRESSION: 1. Mild fat stranding around the pancreas, likely from generalized volume overload but please correlate with serum enzymes. 2. Small volume ascites accumulated around the liver. 3. Cardiomegaly and atherosclerosis. Electronically Signed   By: Monte Fantasia M.D.   On: 01/17/2019 04:38   Dg Chest 1 View  Result Date: 01/17/2019 CLINICAL DATA:  Shortness of breath EXAM: CHEST  1 VIEW COMPARISON:  December 13, 2018 FINDINGS: There is stable cardiomegaly. Pulmonary vascular congestion and mild increased interstitial markings throughout both lungs. No focal airspace consolidation. Bilateral shoulder arthritis is seen. IMPRESSION: Cardiomegaly with pulmonary vascular congestion and interstitial edema. Electronically Signed   By: Prudencio Pair M.D.   On: 01/17/2019 00:55   US Venous Img Lower Unilateral Left  Result Date: 01/17/2019 CLINICAL DATA:  Left lower extremity edema for the past month. Former smoker. Patient is on anticoagulation. Evaluate for DVT. EXAM: LEFT LOWER EXTREMITY VENOUS DOPPLER ULTRASOUND TECHNIQUE: Gray-scale sonography with graded compression, as well as color Doppler and duplex ultrasound were performed to evaluate the lower extremity deep venous systems from the level of the common femoral vein and including the common femoral, femoral, profunda femoral, popliteal and calf veins including the posterior tibial, peroneal and gastrocnemius veins when visible. The superficial great saphenous vein was also interrogated. Spectral Doppler was utilized to  evaluate flow at rest and with distal augmentation maneuvers in the common femoral, femoral and popliteal veins. COMPARISON:  None. FINDINGS: Examination is degraded due to patient body habitus and poor sonographic window. Contralateral Common Femoral Vein: Respiratory phasicity is normal and symmetric with the symptomatic side. No evidence of thrombus. Normal compressibility. Common Femoral Vein: No evidence of thrombus. Normal compressibility, respiratory phasicity and response to augmentation. Saphenofemoral Junction: No evidence of thrombus. Normal compressibility and flow on color Doppler imaging. Profunda Femoral Vein: No evidence of thrombus. Normal compressibility and flow on color Doppler imaging. Femoral Vein: No evidence of thrombus. Normal compressibility, respiratory phasicity and response to augmentation. Popliteal Vein: No evidence of thrombus. Normal compressibility, respiratory phasicity and response to augmentation. Calf Veins: No evidence of thrombus. Normal compressibility and flow on color Doppler imaging. Superficial Great Saphenous Vein: No evidence of thrombus. Normal compressibility. Venous Reflux:  None. Other Findings: Note is made of an approximately 5.9 x 2.6 x 1.5 cm serpiginous fluid collection with left popliteal fossa compatible with a Baker cyst, similar to the 09/2018 examination, previously, 6.5 x 2.4 x 1.6 cm. Minimal subcutaneous edema is seen at the level the left calf. IMPRESSION: 1. No evidence of DVT within left lower extremity. 2. 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 Electronically Signed   By: Sandi Mariscal M.D.   On: 01/17/2019 16:41     ASSESSMENT AND PLAN:   68 y.o. female with pertinent past medical history of chronic systolic heart failure CAD, persistent atrial fibrillation s/p cardioversion on Coumadin, diabetes mellitus, chronic anemia, CKD, hyperlipidemia, carotid artery disease s/p left carotid endarterectomy with previous CVA, and  hypertension into the ED with chief complaints of right flank pain, shortness of breath and rectal bleed  * Acute on chronicsystolicCongestive Heart Failure Last Echo 12/2018 with severely reduced LV EF 20-35% - Hold Coreg  and losartan due to hypotension on presentation - IV lasix -CardiologyConsulted  * GI Bleed-patient presenting with rectal bleed found to have hemoglobin 6.7 dropped from 10.3 - Status post vitamin K to reverse Coumadin Transfused but Hb trending down again and will need transfusion GI consulted and following  * AKI on CKD 3  Likely cardiorenal syndrome Nephrology consulted Monitor  * Persistent atrial fibrillation - s/p cardioversion still in A. Fib - Continue amiodarone per cardiology - Hold Coumadin for now - We will re-dose per pharmacy - Continue to monitor INR  * HTN  -Hold Antihypertensive due to hypotension   * Diabetes mellitus - Recent hgb A1c 7.2 - Hold Glipizide - SSI  * Hypothyroidism -continue levothyroxine  * History of CVA without residual deficit  -Holding Coumadin for now in the setting of active GI bleed.  All the records are reviewed and case discussed with Care Management/Social Worker Management plans discussed with the patient, family and they are in agreement.  CODE STATUS: FULL CODE  TOTAL TIME TAKING CARE OF THIS PATIENT: 35 minutes.   POSSIBLE D/C IN 1-2 DAYS, DEPENDING ON CLINICAL CONDITION.  Leia Alf Grissel Tyrell M.D on 01/18/2019 at 1:16 PM  Between 7am to 6pm - Pager - [phone]  After 6pm go to [email or web address] - password [email or web address] [facility]  Office  [phone]  CC: Primary care physician; [name], MD  Note: This dictation was prepared with Dragon dictation along with smaller phrase technology. Any transcriptional errors that result from this process are unintentional.

## 2019-01-18 NOTE — Consult Note (Signed)
MEDICATION RELATED CONSULT NOTE - INITIAL   Pharmacy Consult for Milrinone Indication: inotropic support  No Known Allergies  Patient Measurements: Height: 5\' 7"  (170.2 cm) Weight: (!) 306 lb 7 oz (139 kg) IBW/kg (Calculated) : 61.6  Vital Signs: Temp: 98.1 F (36.7 C) (10/10 0838) Temp Source: Oral (10/10 0838) BP: 95/57 (10/10 1000) Pulse Rate: 88 (10/10 1000)  Estimated Creatinine Clearance: 21.9 mL/min (A) (by C-G formula based on SCr of 3.49 mg/dL (H)).  Medical History: Past Medical History:  Diagnosis Date  . Carotid arterial disease ([place])    a. 02/2012 s/p L CEA.  . CKD stage G3b/A1, GFR 30-44 and albumin creatinine ratio <30 mg/g   . Coronary artery disease    a. 02/2012 Lexi MV: Anterior, anteroseptal, septal, apical infarct with mild peri-infarct ischemia, EF 41%- medically managed due to anemia; b. 03/2017 Cath: LM nl, LAD 99ost/p/m - faint L->L collaterals, RI 20ost, LCX 24m, RCA 20p, 73m-->Med Rx for chronic subtotal LAD dzs. Avoid R Radial access in future.  . 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 Hyperlipidemia   . Hypertension   . Ischemic cardiomyopathy    a. 06/2013 Echo: EF 35-40%, glob HK, mildly dil LA, mild LVH, mild TR, mild to mod MR; b. 07/2016 Echo: EF 25-30%, sev mid-apicalanteroseptal, ant, apical HK, mild MR, sev dil LA, mod dil RA, mod TR, PASP 21mmHg; c. 11/2016 Echo: EF 20-25%, antsept DK, Gr2 DD, mild MR, mod dil LA/RV/RA, mod red RV fxn, mod-sev TR, PASP 40-71mmHg.  . Morbid obesity ([place])   . Normocytic anemia   . Persistent atrial fibrillation (HCC)    a. CHA2DS2VASc = 7-->coumadin; maintaining sinus on amio.  . Stroke [facility]) 2013   Assessment: CK is a 57 YOF who presented for evaluation of CHF. Patient has been in and out of atrial fibrillation since 2018, with a recent hospitalization in 12/2018 which she was found to have an EF of 30-35%, moderate pulmonary HTN with mildly reduced RV systolic function, and a severely  dilated left atrium.  She underwent cardioversion on 01/03/2019 which was briefly successful, but returned to atrial fibrillation at follow-up on 01/09/2019.  At this visit, she noted continuous weight gain, severe leg edema, and dyspnea. Her Lasix 40mg  PO BID was changed to torsemide 40mg  PO BID and remained on low-dose carvedilol and losartan.  Over the past week, patient has been more somnolent with continued lower extremity swelling and shortness of breath, prompting an ED visit this morning. She was found to be significantly volume overloaded and anemic. Due to presenting with hypotension and acute on chronic kidney disease, carvedilol and losartan were held. Patient was given 40mg  IV Lasic in ED with no document UOP. BP has remained soft in the 90s over 60s. Per cardiology, patient to be on milrinone for inotropic support for suspected cardiorenal syndrome.  Plan:  - Defer to CCM and cardiology for titration to achieve goals of therapy - Start on the low end of dosing (0.142mcg/kg/min), may be up-titrated to a recommended maximum dose of 0.375mcg/kg/min based on CrCl of 10-50mL/min. Titrating to >0.375 mcg/kg/minute in significant renal impairment is generally not recommended due to likelihood for accumulation  - MAP @ 69.   Meghan [name], PharmD, BCPS 01/18/2019,11:11 AM

## 2019-01-18 NOTE — Consult Note (Signed)
Name: Meghan [name] MRN: [id number] DOB: Mar 09, 1951    ADMISSION DATE:  01/17/2019 CONSULTATION DATE:  01/17/2019  REFERRING MD :  Rufina Falco, NP  CHIEF COMPLAINT:  Follow up SOB  BRIEF PATIENT DESCRIPTION:  68 year old female admitted with acute hypoxic respiratory failure secondary to acute on chronic systolic CHF, acute blood loss anemia secondary to GI bleed, AKI on CKD, and right sided flank/back pain.   SIGNIFICANT EVENTS  10/9>>Admission to Stepdown 10/9>> GI, Nephrology, Cardiology consulted, started Milrinone infusion   STUDIES:  10/9 - CT Abdomen & Pelvis:1. Mild fat stranding around the pancreas, likely from generalized volume overload but please correlate with serum enzymes. 2. Small volume ascites accumulated around the liver. 3. Cardiomegaly and atherosclerosis.  10/9 - Venous Ultrasound LLE>>  CULTURES: SARS-CoV-2 PCR 10/9>> negative  ANTIBIOTICS: Ceftriaxone 10/9>>  HISTORY OF PRESENT ILLNESS:   Alert and awake Following commands Eating breakfast SOB improving slowly  No Known Allergies     Review of Systems:  Gen:  Denies  fever, sweats, chills weight loss  HEENT: Denies blurred vision, double vision, ear pain, eye pain, hearing loss, nose bleeds, sore throat Cardiac:  No dizziness, chest pain or heaviness, chest tightness,edema, No JVD Resp:   No cough, -sputum production, -shortness of breath,-wheezing, -hemoptysis,  Gi: Denies swallowing difficulty, stomach pain, nausea or vomiting, diarrhea, constipation, bowel incontinence Gu:  Denies bladder incontinence, burning urine Ext:   Denies Joint pain, stiffness or swelling Skin: Denies  skin rash, easy bruising or bleeding or hives Endoc:  Denies polyuria, polydipsia , polyphagia or weight change Psych:   Denies depression, insomnia or hallucinations  Other:  All other systems negative   VITAL SIGNS: Temp:  [97.4 F (36.3 C)-98.4 F (36.9 C)] 98.1 F (36.7 C) (10/10 0200) Pulse Rate:   [62-96] 96 (10/10 0200) Resp:  [10-20] 16 (10/10 0200) BP: (83-118)/(46-91) 115/61 (10/10 0200) SpO2:  [89 %-98 %] 92 % (10/10 0200)  Physical Examination:   GENERAL:NAD, no fevers, chills, no weakness no fatigue HEAD: Normocephalic, atraumatic.  EYES: PERLA, EOMI No scleral icterus.  MOUTH: Moist mucosal membrane.  EAR, NOSE, THROAT: Clear without exudates. No external lesions.  NECK: Supple. No thyromegaly.  No JVD.  PULMONARY: CTA B/L+crackles CARDIOVASCULAR: S1 and S2. Regular rate and rhythm. No murmurs GASTROINTESTINAL: Soft, nontender, nondistended. Positive bowel sounds.  MUSCULOSKELETAL: + swelling, + edema.  NEUROLOGIC: No gross focal neurological deficits. 5/5 strength all extremities SKIN: No ulceration, lesions, rashes, or cyanosis.  PSYCHIATRIC: Insight, judgment intact. -depression -anxiety ALL OTHER ROS ARE NEGATIVE      Recent Labs  Lab 01/17/19 0011 01/18/19 0654  NA 133* 136  K 5.0 4.6  CL 100 101  CO2 24 24  BUN 74* 71*  CREATININE 3.82* 3.49*  GLUCOSE 119* 70   Recent Labs  Lab 01/17/19 0011 01/17/19 1305 01/17/19 2156 01/18/19 0654  HGB 6.7* 6.8* 7.3* 7.0*  HCT 21.7* 22.1* 22.8* 21.9*  WBC 6.6  --   --  6.9  PLT 193  --   --  185    ASSESSMENT / PLAN:  Progressive cardio-renal syndrome in setting of morbid obesity and OSA/OHS Follow up cardiology recs Continue milrinone infusion per cardiology  ACUTE SYSTOLIC CARDIAC FAILURE- EF 25% -oxygen as needed -Lasix as tolerated -follow up cardiac enzymes as indicated -follow up cardiology recs  Acute GIB Watch H/H  ACUTE KIDNEY INJURY/Renal Failure -follow chem 7 -follow UO -continue Foley Catheter-assess need -Avoid nephrotoxic agents  Severe Hypoglycemia On 210 infusion  Overall prognosis is very poor Continue therapy as prescribed   Corrin Parker, M.D.  [facility]  Medical Director [place] Director [facility]  Cardio-Pulmonary Department

## 2019-01-18 NOTE — Evaluation (Signed)
Physical Therapy Evaluation Patient Details Name: Meghan [name] MRN: [id number] DOB: January 06, 1951 Today's Date: 01/18/2019   History of Present Illness  From MD H&P: Pt is a 68 y.o. female with pertinent past medical history of chronic systolic heart failure CAD, persistent atrial fibrillation s/p cardioversion on Coumadin, diabetes mellitus, chronic anemia, CKD, hyperlipidemia, carotid artery disease s/p left carotid endarterectomy with previous CVA, and hypertension into the ED with chief complaints of right flank pain, shortness of breath and rectal bleed.  MD assessment includes: Progressive cardio-renal syndrome in setting of morbid obesity, systolic cardiac failure with EF 25%, GI bleed, AKI/renal failure, and severe hypoglycemia.    Clinical Impression  Pt presented with deficits in strength, transfers, mobility, gait, balance, and activity tolerance.  Pt required +2 mod A with bed mobility tasks and +2 min A with transfers from an elevated EOB.  Once in standing pt was able to take several small side-steps with min A to guide the RW before requiring to return to sitting secondary to fatigue.  After a short therapeutic seated rest break the pt was able to repeat the above process taking several more small side steps.  Prior to this admission the pt was Ind with bed mobility tasks and transfers but was functionally non-ambulatory for "years" and will benefit from HHPT services upon discharge to safely address above deficits for decreased caregiver assistance and eventual return to PLOF.        Follow Up Recommendations Home health PT;Supervision for mobility/OOB    Equipment Recommendations  None recommended by PT    Recommendations for Other Services       Precautions / Restrictions Precautions Precautions: Fall Precaution Comments: Keep O2 >/= 92% Restrictions Weight Bearing Restrictions: No      Mobility  Bed Mobility Overal bed mobility: Needs Assistance Bed Mobility: Supine to  Sit;Sit to Supine     Supine to sit: Mod assist;+2 for safety/equipment Sit to supine: Mod assist;+2 for safety/equipment   General bed mobility comments: mod A for BLE translation to EOB (R>L 2/2 pain) as well as squaring hips to EOB  Transfers Overall transfer level: Needs assistance Equipment used: Rolling walker (2 wheeled) Transfers: Sit to/from Stand Sit to Stand: Min assist;+2 physical assistance;+2 safety/equipment         General transfer comment: min A +2 to rise and steady, VC's needed for safe hand placement. Pt fatigues easily.  Ambulation/Gait Ambulation/Gait assistance: Min assist Gait Distance (Feet): 2 Feet Assistive device: Rolling walker (2 wheeled) Gait Pattern/deviations: Step-to pattern;Trunk flexed;Decreased step length - right;Decreased step length - left Gait velocity: decreased   General Gait Details: Min A to guide the RW with pt able to take several very small side-steps at the EOB x 2 with seated therapeutic rest break between walks  Stairs            Wheelchair Mobility    Modified Rankin (Stroke Patients Only)       Balance Overall balance assessment: Needs assistance Sitting-balance support: Bilateral upper extremity supported;Feet supported Sitting balance-Leahy Scale: Fair     Standing balance support: Bilateral upper extremity supported;During functional activity Standing balance-Leahy Scale: Fair Standing balance comment: Mod lean on the RW in standing but no assistance required to prevent LOB                             Pertinent Vitals/Pain Pain Assessment: Faces Faces Pain Scale: Hurts little more Pain Location: RLE; generalized with  mobility, no pain at rest Pain Descriptors / Indicators: Aching;Moaning;Grimacing Pain Intervention(s): Monitored during session;Limited activity within patient's tolerance    Home Living Family/patient expects to be discharged to:: Private residence Living Arrangements:  Children;Other relatives Available Help at Discharge: Family;Available 24 hours/day Type of Home: House Home Access: Ramped entrance     Home Layout: One level Home Equipment: Walker - 2 wheels;Wheelchair - manual;Bedside commode Additional Comments: Pt does not go into BR, w/c does not fit. uses BSC Outside of bathroom and sponge bathes at baseline    Prior Function Level of Independence: Needs assistance   Gait / Transfers Assistance Needed: Mostly uses w/c for mobility, but able to SPT from bed <> w/c, w/c <> BSC independently; one fall in the last 6 months just prior to this admission  ADL's / Homemaking Assistance Needed: Assist to sponge bathe and LB dressing from dtr, dtr also assists with IADL management        Hand Dominance   Dominant Hand: Left    Extremity/Trunk Assessment   Upper Extremity Assessment Upper Extremity Assessment: Generalized weakness    Lower Extremity Assessment Lower Extremity Assessment: Generalized weakness       Communication   Communication: No difficulties  Cognition Arousal/Alertness: Awake/alert Behavior During Therapy: WFL for tasks assessed/performed Overall Cognitive Status: Within Functional Limits for tasks assessed                                        General Comments General comments (skin integrity, edema, etc.): edematous BLES (L>R). Hematoma on L foot    Exercises Other Exercises Other Exercises: Lateral scooting in sitting at the EOB Other Exercises: Multiple sit to/from stand transfers with static standing for increased activity tolerance   Assessment/Plan    PT Assessment Patient needs continued PT services  PT Problem List Decreased strength;Decreased activity tolerance;Decreased balance;Decreased mobility;Decreased knowledge of use of DME       PT Treatment Interventions DME instruction;Gait training;Functional mobility training;Therapeutic activities;Therapeutic exercise;Balance  training;Patient/family education    PT Goals (Current goals can be found in the Care Plan section)  Acute Rehab PT Goals Patient Stated Goal: To return to PLOF PT Goal Formulation: With patient Time For Goal Achievement: 01/31/19 Potential to Achieve Goals: Good    Frequency Min 2X/week   Barriers to discharge        Co-evaluation PT/OT/SLP Co-Evaluation/Treatment: Yes Reason for Co-Treatment: For patient/therapist safety;To address functional/ADL transfers PT goals addressed during session: Mobility/safety with mobility;Proper use of DME OT goals addressed during session: ADL's and self-care       AM-PAC PT "6 Clicks" Mobility  Outcome Measure Help needed turning from your back to your side while in a flat bed without using bedrails?: A Lot Help needed moving from lying on your back to sitting on the side of a flat bed without using bedrails?: A Lot Help needed moving to and from a bed to a chair (including a wheelchair)?: A Lot Help needed standing up from a chair using your arms (e.g., wheelchair or bedside chair)?: A Lot Help needed to walk in hospital room?: Total Help needed climbing 3-5 steps with a railing? : Total 6 Click Score: 10    End of Session Equipment Utilized During Treatment: Gait belt Activity Tolerance: Patient limited by fatigue Patient left: in bed;with call bell/phone within reach;with family/visitor present Nurse Communication: Mobility status PT Visit Diagnosis: Muscle weakness (generalized) (  M62.81);Difficulty in walking, not elsewhere classified (R26.2)    Time: [id number] PT Time Calculation (min) (ACUTE ONLY): 37 min   Charges:   PT Evaluation $PT Eval Moderate Complexity: 1 Mod PT Treatments $Therapeutic Activity: 8-22 mins        D. Scott Maquita Sandoval PT, DPT 01/18/19, 1:36 PM

## 2019-01-18 NOTE — Evaluation (Signed)
Occupational Therapy Evaluation Patient Details Name: Meghan [name] MRN: [id number] DOB: [birth year] Today's Date: 01/18/2019    History of Present Illness 68 year old female admitted with acute hypoxic respiratory failure secondary to acute on chronic systolic CHF, acute blood loss anemia secondary to GI bleed, AKI on CKD, and right sided flank/back pain.   Clinical Impression   Pt admitted with above diagnoses, with cardiopulmonary compromises, generalized weakness, and decreased activity tolerance limiting ability to engage in BADL at desired level of ind. PTA pt living with dtr and grandchildren; requires assist to sponge bathe and for LB dressing. Her BR is not handicap accessible for her w/c and uses BSC outside of BR as well. At time of evaluation, pt is mod A +2 for line management to EOB 2/2 painful RLE and min A +2 for sit <> stand to side step up in bed. Pt fatigues easily, needing rest break and breathing cues with this amount of activity. Pt on 2L [place] and at times hovering in high 80s, recovers quickly with cues of deep breathing. At this time, recommending [place] with 24/7 supervision. Will continue to follow per POC  Listed below.     Follow Up Recommendations  Home health OT;Supervision/Assistance - 24 hour    Equipment Recommendations  Other (comment)(LB ADL equip)    Recommendations for Other Services       Precautions / Restrictions Precautions Precautions: Fall Precaution Comments: watch O2 sats Restrictions Weight Bearing Restrictions: No      Mobility Bed Mobility Overal bed mobility: Needs Assistance Bed Mobility: Supine to Sit;Sit to Supine     Supine to sit: Mod assist;+2 for safety/equipment Sit to supine: Mod assist;+2 for safety/equipment   General bed mobility comments: mod A for BLE translation to EOB (R>L 2/2 pain) as well as squaring hips to EOB  Transfers Overall transfer level: Needs assistance Equipment used: Rolling walker (2  wheeled) Transfers: Sit to/from Stand Sit to Stand: Min assist;+2 physical assistance;+2 safety/equipment         General transfer comment: min A +2 to rise and steady, VC's needed for safe hand placement. Pt fatigues easily    Balance Overall balance assessment: Needs assistance Sitting-balance support: Bilateral upper extremity supported;Feet supported Sitting balance-Leahy Scale: Fair     Standing balance support: Bilateral upper extremity supported;During functional activity Standing balance-Leahy Scale: Poor Standing balance comment: reliant on external support                           ADL either performed or assessed with clinical judgement   ADL Overall ADL's : Needs assistance/impaired Eating/Feeding: Set up;Sitting   Grooming: Set up;Sitting   Upper Body Bathing: Sitting;Minimal assistance   Lower Body Bathing: Total assistance;Sit to/from stand;Sitting/lateral leans   Upper Body Dressing : Minimal assistance;Sitting   Lower Body Dressing: Total assistance;Sitting/lateral leans;Sit to/from stand   Toilet Transfer: Minimal assistance;+2 for physical assistance;+2 for safety/equipment;BSC;Stand-pivot   Toileting- Clothing Manipulation and Hygiene: Minimal assistance;Sitting/lateral lean     Tub/Shower Transfer Details (indicate cue type and reason): does not t/f this way at baseline Functional mobility during ADLs: Minimal assistance;+2 for physical assistance;+2 for safety/equipment;Rolling walker General ADL Comments: pt ltd from cardiopulmonary compromise, decreased activity tolerance and gen weakness     Vision   Vision Assessment?: No apparent visual deficits     Perception     Praxis      Pertinent Vitals/Pain Pain Assessment: Faces Faces Pain Scale: Hurts little more Pain Location:  RLE; generalized with mobility Pain Descriptors / Indicators: Aching;Moaning;Grimacing Pain Intervention(s): Limited activity within patient's  tolerance;Monitored during session;Repositioned     Hand Dominance Left   Extremity/Trunk Assessment Upper Extremity Assessment Upper Extremity Assessment: Generalized weakness   Lower Extremity Assessment Lower Extremity Assessment: Defer to PT evaluation       Communication Communication Communication: No difficulties   Cognition Arousal/Alertness: Awake/alert Behavior During Therapy: WFL for tasks assessed/performed Overall Cognitive Status: Within Functional Limits for tasks assessed                                     General Comments  edematous BLES (L>R). Hematoma on L foot    Exercises     Shoulder Instructions      Home Living Family/patient expects to be discharged to:: Private residence Living Arrangements: Children;Other relatives(grandchildren) Available Help at Discharge: Family;Available 24 hours/day Type of Home: House Home Access: Ramped entrance     Home Layout: One level           Bathroom Accessibility: No   Home Equipment: Walker - 2 wheels;Wheelchair - manual;Bedside commode   Additional Comments: pt does not go into BR, w/c does not fit. uses BSC Outside of bathroom and sponge bathes at baseline      Prior Functioning/Environment Level of Independence: Needs assistance  Gait / Transfers Assistance Needed: mostly uses w/c for mobility, but able to transfer from bed <> w/c, w/c <> BSC ADL's / Homemaking Assistance Needed: assist to sponge bathe and LB dressing from dtr, dtr also assists with IADL management            OT Problem List: Decreased strength;Decreased knowledge of use of DME or AE;Increased edema;Obesity;Decreased activity tolerance;Cardiopulmonary status limiting activity;Impaired balance (sitting and/or standing);Pain      OT Treatment/Interventions: Self-care/ADL training;Therapeutic exercise;Patient/family education;Balance training;Energy conservation;Therapeutic activities;DME and/or AE instruction     OT Goals(Current goals can be found in the care plan section) Acute Rehab OT Goals Patient Stated Goal: return to ind OT Goal Formulation: With patient Time For Goal Achievement: 02/01/19 Potential to Achieve Goals: Good  OT Frequency: Min 2X/week   Barriers to D/C:            Co-evaluation PT/OT/SLP Co-Evaluation/Treatment: Yes Reason for Co-Treatment: For patient/therapist safety;To address functional/ADL transfers PT goals addressed during session: Mobility/safety with mobility OT goals addressed during session: ADL's and self-care      AM-PAC OT "6 Clicks" Daily Activity     Outcome Measure Help from another person eating meals?: A Little Help from another person taking care of personal grooming?: A Little Help from another person toileting, which includes using toliet, bedpan, or urinal?: A Lot Help from another person bathing (including washing, rinsing, drying)?: A Lot Help from another person to put on and taking off regular upper body clothing?: A Little Help from another person to put on and taking off regular lower body clothing?: A Lot 6 Click Score: 15   End of Session Equipment Utilized During Treatment: Gait belt;Rolling walker Nurse Communication: Mobility status  Activity Tolerance: Patient tolerated treatment well Patient left: in bed;with call bell/phone within reach;with bed alarm set;with family/visitor present  OT Visit Diagnosis: Other abnormalities of gait and mobility (R26.89);Unsteadiness on feet (R26.81);Muscle weakness (generalized) (M62.81);Pain Pain - Right/Left: Right Pain - part of body: Leg                Time: 1050-1120 OT  Time Calculation (min): 30 min Charges:  OT General Charges $OT Visit: 1 Visit OT Evaluation $OT Eval Moderate Complexity: 1 Mod  Zenovia Jarred, MSOT, OTR/L [place] OT/ Acute Relief OT [facility] [phone]   Zenovia Jarred 01/18/2019, 12:44 PM

## 2019-01-18 NOTE — Progress Notes (Signed)
Progress Note  Patient Name: Meghan [name] Date of Encounter: 01/18/2019  Primary Cardiologist: Kathlyn Sacramento, MD    Patient Profile     68 y.o. female admitted with acute respiratory failure, acute/chronic systolic heart failure atrial fibrillation-persistent with recent cardioversion with failure to maintain sinus, admitted with progressive weakness and found to have hemoglobin of 6.7.  Was 11.3  12/13/2018 and 10.3 12/30/2018 Acute kidney injury, felt to have incipient cardiorenal syndrome and was started on milrinone (1.15-3/20   2.44-9/15/2020  Subjective   Feeling better  Less swollen   Inpatient Medications    Scheduled Meds:  sodium chloride   Intravenous Once   sodium chloride   Intravenous Once   amiodarone  200 mg Oral BID   atorvastatin  40 mg Oral Daily   Chlorhexidine Gluconate Cloth  6 each Topical Q0600   furosemide  40 mg Intravenous Q12H   insulin aspart  0-5 Units Subcutaneous QHS   insulin aspart  0-9 Units Subcutaneous TID WC   levothyroxine  50 mcg Oral QAC breakfast    morphine injection  4 mg Intravenous Once   [START ON 01/20/2019] pantoprazole  40 mg Intravenous Q12H   sodium chloride flush  3 mL Intravenous Q12H   Continuous Infusions:  sodium chloride Stopped (01/17/19 1010)   albumin human 12.5 g (01/18/19 1011)   cefTRIAXone (ROCEPHIN)  IV Stopped (01/18/19 0913)   milrinone 0.125 mcg/kg/min (01/18/19 1000)   pantoprozole (PROTONIX) infusion 8 mg/hr (01/17/19 1953)   PRN Meds: sodium chloride, acetaminophen, ondansetron (ZOFRAN) IV, sodium chloride flush   Vital Signs    Vitals:   01/18/19 0900 01/18/19 1000 01/18/19 1100 01/18/19 1200  BP: 91/74 (!) 95/57 109/66 99/60  Pulse: 99 88 93 88  Resp: 18 16 16 15   Temp:    97.9 F (36.6 C)  TempSrc:    Oral  SpO2: 94% 94% 93% 99%  Weight:      Height:        Intake/Output Summary (Last 24 hours) at 01/18/2019 1309 Last data filed at 01/18/2019 1000 Gross per 24  hour  Intake 1521.36 ml  Output 2250 ml  Net -728.64 ml   Last 3 Weights 01/17/2019 01/17/2019 01/09/2019  Weight (lbs) 306 lb 7 oz 265 lb (No Data)  Weight (kg) 139 kg 120.203 kg (No Data)      Telemetry       Afib RVR - Personally Reviewed  ECG      Personally Reviewed  Physical Exam    GEN: No acute distress.   Neck: No JVD Cardiac:IRRR, no murmurs, rubs, or gallops.  Respiratory: Clear to auscultation bilaterally. GI: Soft, nontender, non-distended  MS tr edema; No deformity. Neuro:  Nonfocal  Psych: Normal affect   Labs    High Sensitivity Troponin:   Recent Labs  Lab 01/17/19 0011  TROPONINIHS 13      Chemistry Recent Labs  Lab 01/17/19 0011 01/17/19 1305 01/18/19 0654  NA 133*  --  136  K 5.0  --  4.6  CL 100  --  101  CO2 24  --  24  GLUCOSE 119*  --  70  BUN 74*  --  71*  CREATININE 3.82*  --  3.49*  CALCIUM 7.7*  --  7.8*  PROT 7.7 7.1  --   ALBUMIN 2.5* 2.2*  --   AST 42* 42*  --   ALT 23 23  --   ALKPHOS 103 79  --   BILITOT  1.3* 1.2  --   GFRNONAA 11*  --  13*  GFRAA 13*  --  15*  ANIONGAP 9  --  11     Hematology Recent Labs  Lab 01/17/19 0011  01/17/19 2156 01/18/19 0654 01/18/19 1025  WBC 6.6  --   --  6.9  --   RBC 2.65*  --   --  2.66*  --   HGB 6.7*   < > 7.3* 7.0* 6.7*  HCT 21.7*   < > 22.8* 21.9* 21.5*  MCV 81.9  --   --  82.3  --   MCH 25.3*  --   --  26.3  --   MCHC 30.9  --   --  32.0  --   RDW 18.9*  --   --  18.5*  --   PLT 193  --   --  185  --    < > = values in this interval not displayed.    BNP Recent Labs  Lab 01/17/19 0011  BNP 790.0*     DDimer No results for input(s): DDIMER in the last 168 hours.   Radiology    Ct Abdomen Pelvis Wo Contrast  Result Date: 01/17/2019 CLINICAL DATA:  Right flank pain and leg weakness. EXAM: CT ABDOMEN AND PELVIS WITHOUT CONTRAST TECHNIQUE: Multidetector CT imaging of the abdomen and pelvis was performed following the standard protocol without IV contrast.  COMPARISON:  None. FINDINGS: Lower chest:  Cardiomegaly with multifocal coronary atherosclerosis. Hepatobiliary: No focal liver abnormality.Cholecystectomy. No bile duct dilatation. Pancreas: Mild fat stranding around the head of pancreas and mid duodenum, but no duodenal wall thickening or clear pancreatic septal edema. Spleen: Unremarkable. Adrenals/Urinary Tract: Negative adrenals. No hydronephrosis or stone. Unremarkable bladder. Stomach/Bowel:  No obstruction. No evidence of bowel inflammation. Vascular/Lymphatic: No acute vascular abnormality. Scattered atherosclerotic calcifications. No mass or adenopathy. Reproductive:Pelvic floor laxity.  Question pelvic sling surgery. Other: Small volume perihepatic ascites.  Prominent anasarca Musculoskeletal: Severe and diffuse lumbar spine degeneration with L4-5 anterolisthesis. Bilateral degenerative hip narrowing. IMPRESSION: 1. Mild fat stranding around the pancreas, likely from generalized volume overload but please correlate with serum enzymes. 2. Small volume ascites accumulated around the liver. 3. Cardiomegaly and atherosclerosis. Electronically Signed   By: Monte Fantasia M.D.   On: 01/17/2019 04:38   Dg Chest 1 View  Result Date: 01/17/2019 CLINICAL DATA:  Shortness of breath EXAM: CHEST  1 VIEW COMPARISON:  December 13, 2018 FINDINGS: There is stable cardiomegaly. Pulmonary vascular congestion and mild increased interstitial markings throughout both lungs. No focal airspace consolidation. Bilateral shoulder arthritis is seen. IMPRESSION: Cardiomegaly with pulmonary vascular congestion and interstitial edema. Electronically Signed   By: Prudencio Pair M.D.   On: 01/17/2019 00:55   US Venous Img Lower Unilateral Left  Result Date: 01/17/2019 CLINICAL DATA:  Left lower extremity edema for the past month. Former smoker. Patient is on anticoagulation. Evaluate for DVT. EXAM: LEFT LOWER EXTREMITY VENOUS DOPPLER ULTRASOUND TECHNIQUE: Gray-scale sonography  with graded compression, as well as color Doppler and duplex ultrasound were performed to evaluate the lower extremity deep venous systems from the level of the common femoral vein and including the common femoral, femoral, profunda femoral, popliteal and calf veins including the posterior tibial, peroneal and gastrocnemius veins when visible. The superficial great saphenous vein was also interrogated. Spectral Doppler was utilized to evaluate flow at rest and with distal augmentation maneuvers in the common femoral, femoral and popliteal veins. COMPARISON:  None. FINDINGS: Examination is degraded  due to patient body habitus and poor sonographic window. Contralateral Common Femoral Vein: Respiratory phasicity is normal and symmetric with the symptomatic side. No evidence of thrombus. Normal compressibility. Common Femoral Vein: No evidence of thrombus. Normal compressibility, respiratory phasicity and response to augmentation. Saphenofemoral Junction: No evidence of thrombus. Normal compressibility and flow on color Doppler imaging. Profunda Femoral Vein: No evidence of thrombus. Normal compressibility and flow on color Doppler imaging. Femoral Vein: No evidence of thrombus. Normal compressibility, respiratory phasicity and response to augmentation. Popliteal Vein: No evidence of thrombus. Normal compressibility, respiratory phasicity and response to augmentation. Calf Veins: No evidence of thrombus. Normal compressibility and flow on color Doppler imaging. Superficial Great Saphenous Vein: No evidence of thrombus. Normal compressibility. Venous Reflux:  None. Other Findings: Note is made of an approximately 5.9 x 2.6 x 1.5 cm serpiginous fluid collection with left popliteal fossa compatible with a Baker cyst, similar to the 09/2018 examination, previously, 6.5 x 2.4 x 1.6 cm. Minimal subcutaneous edema is seen at the level the left calf. IMPRESSION: 1. No evidence of DVT within left lower extremity. 2. 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 Electronically Signed   By: Sandi Mariscal M.D.   On: 01/17/2019 16:41    Cardiac Studies   ECG 10/8 narrow QRS  Echo 9/20 EF 20-25% Echo 2018 EF 20-25%    Assessment & Plan    Atrial fibrillation-persistent failed cardioversion  Anemia-severe  Acute renal injury  Cardiomyopathy-ischemic  Hypotension  Renal insufficiency is mildly improved today.  With a modest diuresis.  Significant intravenous fluid infusions hopefully we can decrease this.  Would continue transfusion for hemoglobin of 8-9.  Hypotension probably the most fruitful he augmented by transfusion this will also help the tachycardia  Amio is our best option for pharmacological rate control    For questions or updates, please contact [facility] Please consult [email or web address] for contact info under        Signed, Virl Axe, MD  01/18/2019, 1:09 PM

## 2019-01-18 NOTE — Progress Notes (Addendum)
Meghan Darby, MD [address]  [place]  [place], [place] [address]  Main: [phone]  Fax: [phone] Pager: [phone]   Subjective: No further episodes of rectal bleeding.  However, hemoglobin slightly dropped to 6.7 today patient is able to answer questions appropriately, not short of breath, tolerating diet well.  Reports passing flatus   Objective: Vital signs in last 24 hours: Vitals:   01/18/19 1100 01/18/19 1200 01/18/19 1300 01/18/19 1400  BP: 109/66 99/60 (!) 103/59 100/64  Pulse: 93 88 100 85  Resp: 16 15 16 15   Temp:  97.9 F (36.6 C)    TempSrc:  Oral    SpO2: 93% 99% 94% 92%  Weight:      Height:       Weight change:   Intake/Output Summary (Last 24 hours) at 01/18/2019 1606 Last data filed at 01/18/2019 1400 Gross per 24 hour  Intake 1613.3 ml  Output 2000 ml  Net -386.7 ml     Exam: Heart:: Regular rate and rhythm, S1S2 present or without murmur or extra heart sounds Lungs: normal and clear to auscultation Abdomen: Morbidly obese, nontender   Lab Results: CBC Latest Ref Rng & Units 01/18/2019 01/18/2019 01/17/2019  WBC 4.0 - 10.5 K/uL - 6.9 -  Hemoglobin 12.0 - 15.0 g/dL 6.7(L) 7.0(L) 7.3(L)  Hematocrit 36.0 - 46.0 % 21.5(L) 21.9(L) 22.8(L)  Platelets 150 - 400 K/uL - 185 -   CMP Latest Ref Rng & Units 01/18/2019 01/17/2019 01/17/2019  Glucose 70 - 99 mg/dL 70 - 119(H)  BUN 8 - 23 mg/dL 71(H) - 74(H)  Creatinine 0.44 - 1.00 mg/dL 3.49(H) - 3.82(H)  Sodium 135 - 145 mmol/L 136 - 133(L)  Potassium 3.5 - 5.1 mmol/L 4.6 - 5.0  Chloride 98 - 111 mmol/L 101 - 100  CO2 22 - 32 mmol/L 24 - 24  Calcium 8.9 - 10.3 mg/dL 7.8(L) - 7.7(L)  Total Protein 6.5 - 8.1 g/dL - 7.1 7.7  Total Bilirubin 0.3 - 1.2 mg/dL - 1.2 1.3(H)  Alkaline Phos 38 - 126 U/L - 79 103  AST 15 - 41 U/L - 42(H) 42(H)  ALT 0 - 44 U/L - 23 23    Micro Results: Recent Results (from the past 240 hour(s))  SARS Coronavirus 2 by RT PCR (hospital order, performed  in [facility] hospital lab) Nasopharyngeal Nasopharyngeal Swab     Status: None   Collection Time: 01/17/19  1:41 AM   Specimen: Nasopharyngeal Swab  Result Value Ref Range Status   SARS Coronavirus 2 NEGATIVE NEGATIVE Final    Comment: (NOTE) If result is NEGATIVE SARS-CoV-2 target nucleic acids are NOT DETECTED. The SARS-CoV-2 RNA is generally detectable in upper and lower  respiratory specimens during the acute phase of infection. The lowest  concentration of SARS-CoV-2 viral copies this assay can detect is 250  copies / mL. A negative result does not preclude SARS-CoV-2 infection  and should not be used as the sole basis for treatment or other  patient management decisions.  A negative result may occur with  improper specimen collection / handling, submission of specimen other  than nasopharyngeal swab, presence of viral mutation(s) within the  areas targeted by this assay, and inadequate number of viral copies  (<250 copies / mL). A negative result must be combined with clinical  observations, patient history, and epidemiological information. If result is POSITIVE SARS-CoV-2 target nucleic acids are DETECTED. The SARS-CoV-2 RNA is generally detectable in upper and lower  respiratory specimens dur ing the acute phase of infection.  Positive  results are indicative of active infection with SARS-CoV-2.  Clinical  correlation with patient history and other diagnostic information is  necessary to determine patient infection status.  Positive results do  not rule out bacterial infection or co-infection with other viruses. If result is PRESUMPTIVE POSTIVE SARS-CoV-2 nucleic acids MAY BE PRESENT.   A presumptive positive result was obtained on the submitted specimen  and confirmed on repeat testing.  While 2019 novel coronavirus  (SARS-CoV-2) nucleic acids may be present in the submitted sample  additional confirmatory testing may be necessary for epidemiological  and / or clinical  management purposes  to differentiate between  SARS-CoV-2 and other Sarbecovirus currently known to infect humans.  If clinically indicated additional testing with an alternate test  methodology [id number]) is advised. The SARS-CoV-2 RNA is generally  detectable in upper and lower respiratory sp ecimens during the acute  phase of infection. The expected result is Negative. Fact Sheet for Patients:  StrictlyIdeas.no Fact Sheet for Healthcare Providers: BankingDealers.co.za This test is not yet approved or cleared by the [place] FDA and has been authorized for detection and/or diagnosis of SARS-CoV-2 by FDA under an Emergency Use Authorization (EUA).  This EUA will remain in effect (meaning this test can be used) for the duration of the COVID-19 declaration under Section 564(b)(1) of the Act, 21 U.S.C. section 360bbb-3(b)(1), unless the authorization is terminated or revoked sooner. Performed at [facility], [address] [place], [place] [address]   MRSA PCR Screening     Status: None   Collection Time: 01/17/19  4:44 AM   Specimen: Nasal Mucosa; Nasopharyngeal  Result Value Ref Range Status   MRSA by PCR NEGATIVE NEGATIVE Final    Comment:        The GeneXpert MRSA Assay (FDA approved for NASAL specimens only), is one component of a comprehensive MRSA colonization surveillance program. It is not intended to diagnose MRSA infection nor to guide or monitor treatment for MRSA infections. Performed at [facility], [address], [place], [place] [address]   Urine Culture     Status: Abnormal (Preliminary result)   Collection Time: 01/17/19 11:52 AM   Specimen: Urine, Clean Catch  Result Value Ref Range Status   Specimen Description   Final    URINE, CLEAN CATCH Performed at [facility], [address], [place], [place] [address]    Special Requests   Final    Normal Performed at [facility] [address] [place], [place] [address]    Culture (A)  Final    >=100,000 COLONIES/mL ESCHERICHIA COLI SUSCEPTIBILITIES TO FOLLOW Performed at [facility], [place] [address], [place], [place] [address]    Report Status PENDING  Incomplete   Studies/Results: Ct Abdomen Pelvis Wo Contrast  Result Date: 01/17/2019 CLINICAL DATA:  Right flank pain and leg weakness. EXAM: CT ABDOMEN AND PELVIS WITHOUT CONTRAST TECHNIQUE: Multidetector CT imaging of the abdomen and pelvis was performed following the standard protocol without IV contrast. COMPARISON:  None. FINDINGS: Lower chest:  Cardiomegaly with multifocal coronary atherosclerosis. Hepatobiliary: No focal liver abnormality.Cholecystectomy. No bile duct dilatation. Pancreas: Mild fat stranding around the head of pancreas and mid duodenum, but no duodenal wall thickening or clear pancreatic septal edema. Spleen: Unremarkable. Adrenals/Urinary Tract: Negative adrenals. No hydronephrosis or stone. Unremarkable bladder. Stomach/Bowel:  No obstruction. No evidence of bowel inflammation. Vascular/Lymphatic: No acute vascular abnormality. Scattered atherosclerotic calcifications.  No mass or adenopathy. Reproductive:Pelvic floor laxity.  Question pelvic sling surgery. Other: Small volume perihepatic ascites.  Prominent anasarca Musculoskeletal: Severe and diffuse lumbar spine degeneration with L4-5 anterolisthesis. Bilateral degenerative hip narrowing. IMPRESSION: 1. Mild fat stranding around the pancreas, likely from generalized volume overload but please correlate with serum enzymes. 2. Small volume ascites accumulated around the liver. 3. Cardiomegaly and atherosclerosis. Electronically Signed   By: Monte Fantasia M.D.   On: 01/17/2019 04:38   Dg Chest 1 View  Result Date: 01/17/2019 CLINICAL DATA:  Shortness of breath EXAM: CHEST  1 VIEW COMPARISON:  December 13, 2018 FINDINGS: There is stable cardiomegaly. Pulmonary vascular  congestion and mild increased interstitial markings throughout both lungs. No focal airspace consolidation. Bilateral shoulder arthritis is seen. IMPRESSION: Cardiomegaly with pulmonary vascular congestion and interstitial edema. Electronically Signed   By: Prudencio Pair M.D.   On: 01/17/2019 00:55   US Venous Img Lower Unilateral Left  Result Date: 01/17/2019 CLINICAL DATA:  Left lower extremity edema for the past month. Former smoker. Patient is on anticoagulation. Evaluate for DVT. EXAM: LEFT LOWER EXTREMITY VENOUS DOPPLER ULTRASOUND TECHNIQUE: Gray-scale sonography with graded compression, as well as color Doppler and duplex ultrasound were performed to evaluate the lower extremity deep venous systems from the level of the common femoral vein and including the common femoral, femoral, profunda femoral, popliteal and calf veins including the posterior tibial, peroneal and gastrocnemius veins when visible. The superficial great saphenous vein was also interrogated. Spectral Doppler was utilized to evaluate flow at rest and with distal augmentation maneuvers in the common femoral, femoral and popliteal veins. COMPARISON:  None. FINDINGS: Examination is degraded due to patient body habitus and poor sonographic window. Contralateral Common Femoral Vein: Respiratory phasicity is normal and symmetric with the symptomatic side. No evidence of thrombus. Normal compressibility. Common Femoral Vein: No evidence of thrombus. Normal compressibility, respiratory phasicity and response to augmentation. Saphenofemoral Junction: No evidence of thrombus. Normal compressibility and flow on color Doppler imaging. Profunda Femoral Vein: No evidence of thrombus. Normal compressibility and flow on color Doppler imaging. Femoral Vein: No evidence of thrombus. Normal compressibility, respiratory phasicity and response to augmentation. Popliteal Vein: No evidence of thrombus. Normal compressibility, respiratory phasicity and response  to augmentation. Calf Veins: No evidence of thrombus. Normal compressibility and flow on color Doppler imaging. Superficial Great Saphenous Vein: No evidence of thrombus. Normal compressibility. Venous Reflux:  None. Other Findings: Note is made of an approximately 5.9 x 2.6 x 1.5 cm serpiginous fluid collection with left popliteal fossa compatible with a Baker cyst, similar to the 09/2018 examination, previously, 6.5 x 2.4 x 1.6 cm. Minimal subcutaneous edema is seen at the level the left calf. IMPRESSION: 1. No evidence of DVT within left lower extremity. 2. 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 Electronically Signed   By: Sandi Mariscal M.D.   On: 01/17/2019 16:41   Medications:  I have reviewed the patient's current medications. Prior to Admission:  Medications Prior to Admission  Medication Sig Dispense Refill Last Dose   amiodarone (PACERONE) 200 MG tablet Take 200 mg by mouth 2 (two) times daily.   Unknown at Unknown   atorvastatin (LIPITOR) 40 MG tablet Take 1 tablet by mouth once daily (Patient taking differently: Take 40 mg by mouth daily. ) 90 tablet 0 Unknown at Unknown   carvedilol (COREG) 6.25 MG tablet TAKE 1 TABLET BY MOUTH TWICE DAILY WITH A MEAL 180 tablet 0 Unknown at Unknown   glipiZIDE (GLUCOTROL)  10 MG tablet Take 10 mg by mouth 2 (two) times daily. Dr Honor Junes   Unknown at Unknown   Insulin Degludec 200 UNIT/ML SOPN Inject 25 Units into the skin daily at 12 noon. Dr Honor Junes (1300)   Unknown at Unknown   levothyroxine (SYNTHROID) 50 MCG tablet Take 50 mcg by mouth daily before breakfast.   Unknown at Unknown   losartan (COZAAR) 25 MG tablet Take 1 tablet (25 mg total) by mouth daily. 30 tablet 11 Unknown at Unknown   torsemide (DEMADEX) 20 MG tablet Take 2 tablets (40 mg total) by mouth 2 (two) times daily. 120 tablet 4 Unknown at Unknown   triamcinolone cream (KENALOG) 0.5 % Apply 1 application topically 2 (two) times daily as needed  (psoriasis).    prn at prn   warfarin (COUMADIN) 5 MG tablet USE AS DIRECTED BY COUMADIN CLINIC FOR 30 DAYS (Patient not taking: Take one 5 mg tablet Sunday, Tuesday, Wednesday, Thursday, and Saturday. And one and one-half tablets 7.5 mg on Monday and Friday) 45 tablet 3 Not Taking at Unknown time   Scheduled:  sodium chloride   Intravenous Once   sodium chloride   Intravenous Once   amiodarone  200 mg Oral BID   atorvastatin  40 mg Oral Daily   [START ON 01/19/2019] Chlorhexidine Gluconate Cloth  6 each Topical Q0600   furosemide  40 mg Intravenous Q12H   insulin aspart  0-5 Units Subcutaneous QHS   insulin aspart  0-9 Units Subcutaneous TID WC   levothyroxine  50 mcg Oral QAC breakfast    morphine injection  4 mg Intravenous Once   [START ON 01/20/2019] pantoprazole  40 mg Intravenous Q12H   sodium chloride flush  3 mL Intravenous Q12H   Continuous:  sodium chloride Stopped (01/17/19 1010)   albumin human Stopped (01/18/19 1041)   cefTRIAXone (ROCEPHIN)  IV Stopped (01/18/19 0913)   milrinone 0.125 mcg/kg/min (01/18/19 1100)   pantoprozole (PROTONIX) infusion 8 mg/hr (01/17/19 1953)   [id number] chloride, acetaminophen, HYDROcodone-acetaminophen, ondansetron (ZOFRAN) IV, polyethylene glycol, sodium chloride flush Anti-infectives (From admission, onward)   Start     Dose/Rate Route Frequency Ordered Stop   01/17/19 0800  cefTRIAXone (ROCEPHIN) 1 g in sodium chloride 0.9 % 100 mL IVPB     1 g 200 mL/hr over 30 Minutes Intravenous Every 24 hours 01/17/19 0713       Scheduled Meds:  sodium chloride   Intravenous Once   sodium chloride   Intravenous Once   amiodarone  200 mg Oral BID   atorvastatin  40 mg Oral Daily   [START ON 01/19/2019] Chlorhexidine Gluconate Cloth  6 each Topical Q0600   furosemide  40 mg Intravenous Q12H   insulin aspart  0-5 Units Subcutaneous QHS   insulin aspart  0-9 Units Subcutaneous TID WC   levothyroxine  50 mcg Oral QAC  breakfast    morphine injection  4 mg Intravenous Once   [START ON 01/20/2019] pantoprazole  40 mg Intravenous Q12H   sodium chloride flush  3 mL Intravenous Q12H   Continuous Infusions:  sodium chloride Stopped (01/17/19 1010)   albumin human Stopped (01/18/19 1041)   cefTRIAXone (ROCEPHIN)  IV Stopped (01/18/19 0913)   milrinone 0.125 mcg/kg/min (01/18/19 1100)   pantoprozole (PROTONIX) infusion 8 mg/hr (01/17/19 1953)   PRN Meds:.sodium chloride, acetaminophen, HYDROcodone-acetaminophen, ondansetron (ZOFRAN) IV, polyethylene glycol, sodium chloride flush   Assessment: Active Problems:   Acute on chronic systolic CHF (congestive heart failure) (HCC)  Painless rectal  bleeding, stopped at present Differentials include ischemic colitis or diverticular bleed in setting of anticoagulation and severe cardiomyopathy, or colonic malignancy or hemorrhoidal bleed Less likely an upper GI bleed  Plan: Defer endoscopic intervention at this time Can switch to Protonix 40 mg p.o. twice daily tomorrow Monitor CBC once a day, maintain hemoglobin greater than 8 given history of CHF Please consult GI back when closer to discharge or follow-up as outpatient to discuss about endoscopic evaluation once she is medically optimized Can restart anticoagulation if absolutely indicated   LOS: 1 day   Adithya Difrancesco 01/18/2019, 4:06 PM

## 2019-01-18 NOTE — Progress Notes (Signed)
Hypoglycemic Event  CBG: 68  Treatment: Meal tray in front of patient, encouraged to eat  Symptoms: none  Follow-up CBG: Time: 0811 CBG Result: 128  Possible Reasons for Event: unknown, patient reports CBS chronically in 60s with no symptoms  Comments/MD notified: Dr. Lindalou Hose, [facility]

## 2019-01-18 NOTE — Progress Notes (Signed)
[facility], [place] 01/18/19  Subjective:   Patient was started on milrinone.  Has good response.  Urine output of 2800 cc noted yesterday.  States she feels 100% better.  Her daughter is in the room with her.  Patient is able to eat and drink without any nausea or vomiting  Objective:  Vital signs in last 24 hours:  Temp:  [97.4 F (36.3 C)-98.4 F (36.9 C)] 98.1 F (36.7 C) (10/10 0838) Pulse Rate:  [62-104] 88 (10/10 1000) Resp:  [10-21] 16 (10/10 1000) BP: (91-125)/(46-91) 95/57 (10/10 1000) SpO2:  [88 %-98 %] 94 % (10/10 1000)  Weight change:  Filed Weights   01/17/19 0001 01/17/19 0439  Weight: 120.2 kg (!) 139 kg    Intake/Output:    Intake/Output Summary (Last 24 hours) at 01/18/2019 1140 Last data filed at 01/18/2019 1000 Gross per 24 hour  Intake 1521.36 ml  Output 2850 ml  Net -1328.64 ml    Physical Exam: General:  No acute distress, obese female, laying in the bed  HEENT  moist oral mucous membranes  Neck:  Short  Lungs:  mild rhonchi, improved exam, [place] O2  Heart::  Regular rhythm   Abdomen:  Soft, nontender  Extremities:  2-3+ pitting edema  Neurologic:  Alert, able to answer questions  Skin:  Warm, dry      Basic Metabolic Panel:  Recent Labs  Lab 01/17/19 0011 01/18/19 0654  NA 133* 136  K 5.0 4.6  CL 100 101  CO2 24 24  GLUCOSE 119* 70  BUN 74* 71*  CREATININE 3.82* 3.49*  CALCIUM 7.7* 7.8*  MG  --  2.3  PHOS  --  5.2*     CBC: Recent Labs  Lab 01/17/19 0011 01/17/19 1305 01/17/19 2156 01/18/19 0654 01/18/19 1025  WBC 6.6  --   --  6.9  --   NEUTROABS 4.7  --   --   --   --   HGB 6.7* 6.8* 7.3* 7.0* 6.7*  HCT 21.7* 22.1* 22.8* 21.9* 21.5*  MCV 81.9  --   --  82.3  --   PLT 193  --   --  185  --      No results found for: HEPBSAG, HEPBSAB, HEPBIGM    Microbiology:  Recent Results (from the past 240 hour(s))  SARS Coronavirus 2 by RT PCR (hospital order, performed in [facility]  hospital lab) Nasopharyngeal Nasopharyngeal Swab     Status: None   Collection Time: 01/17/19  1:41 AM   Specimen: Nasopharyngeal Swab  Result Value Ref Range Status   SARS Coronavirus 2 NEGATIVE NEGATIVE Final    Comment: (NOTE) If result is NEGATIVE SARS-CoV-2 target nucleic acids are NOT DETECTED. The SARS-CoV-2 RNA is generally detectable in upper and lower  respiratory specimens during the acute phase of infection. The lowest  concentration of SARS-CoV-2 viral copies this assay can detect is 250  copies / mL. A negative result does not preclude SARS-CoV-2 infection  and should not be used as the sole basis for treatment or other  patient management decisions.  A negative result may occur with  improper specimen collection / handling, submission of specimen other  than nasopharyngeal swab, presence of viral mutation(s) within the  areas targeted by this assay, and inadequate number of viral copies  (<250 copies / mL). A negative result must be combined with clinical  observations, patient history, and epidemiological information. If result is POSITIVE SARS-CoV-2 target nucleic acids are  DETECTED. The SARS-CoV-2 RNA is generally detectable in upper and lower  respiratory specimens dur ing the acute phase of infection.  Positive  results are indicative of active infection with SARS-CoV-2.  Clinical  correlation with patient history and other diagnostic information is  necessary to determine patient infection status.  Positive results do  not rule out bacterial infection or co-infection with other viruses. If result is PRESUMPTIVE POSTIVE SARS-CoV-2 nucleic acids MAY BE PRESENT.   A presumptive positive result was obtained on the submitted specimen  and confirmed on repeat testing.  While 2019 novel coronavirus  (SARS-CoV-2) nucleic acids may be present in the submitted sample  additional confirmatory testing may be necessary for epidemiological  and / or clinical management  purposes  to differentiate between  SARS-CoV-2 and other Sarbecovirus currently known to infect humans.  If clinically indicated additional testing with an alternate test  methodology [phone]) is advised. The SARS-CoV-2 RNA is generally  detectable in upper and lower respiratory sp ecimens during the acute  phase of infection. The expected result is Negative. Fact Sheet for Patients:  StrictlyIdeas.no Fact Sheet for Healthcare Providers: BankingDealers.co.za This test is not yet approved or cleared by the [place] FDA and has been authorized for detection and/or diagnosis of SARS-CoV-2 by FDA under an Emergency Use Authorization (EUA).  This EUA will remain in effect (meaning this test can be used) for the duration of the COVID-19 declaration under Section 564(b)(1) of the Act, 21 U.S.C. section 360bbb-3(b)(1), unless the authorization is terminated or revoked sooner. Performed at [facility], [address] [place], [place] [address]   MRSA PCR Screening     Status: None   Collection Time: 01/17/19  4:44 AM   Specimen: Nasal Mucosa; Nasopharyngeal  Result Value Ref Range Status   MRSA by PCR NEGATIVE NEGATIVE Final    Comment:        The GeneXpert MRSA Assay (FDA approved for NASAL specimens only), is one component of a comprehensive MRSA colonization surveillance program. It is not intended to diagnose MRSA infection nor to guide or monitor treatment for MRSA infections. Performed at [facility], [address] [place],  [address]     Coagulation Studies: Recent Labs    01/17/19 0011 01/17/19 1518  LABPROT 24.3* 24.4*  INR 2.2* 2.2*    Urinalysis: Recent Labs    01/17/19 1152  COLORURINE YELLOW*  LABSPEC 1.009  PHURINE 5.0  GLUCOSEU NEGATIVE  HGBUR MODERATE*  BILIRUBINUR NEGATIVE  KETONESUR NEGATIVE  PROTEINUR NEGATIVE  NITRITE NEGATIVE  LEUKOCYTESUR LARGE*       Imaging: Ct Abdomen Pelvis Wo Contrast  Result Date: 01/17/2019 CLINICAL DATA:  Right flank pain and leg weakness. EXAM: CT ABDOMEN AND PELVIS WITHOUT CONTRAST TECHNIQUE: Multidetector CT imaging of the abdomen and pelvis was performed following the standard protocol without IV contrast. COMPARISON:  None. FINDINGS: Lower chest:  Cardiomegaly with multifocal coronary atherosclerosis. Hepatobiliary: No focal liver abnormality.Cholecystectomy. No bile duct dilatation. Pancreas: Mild fat stranding around the head of pancreas and mid duodenum, but no duodenal wall thickening or clear pancreatic septal edema. Spleen: Unremarkable. Adrenals/Urinary Tract: Negative adrenals. No hydronephrosis or stone. Unremarkable bladder. Stomach/Bowel:  No obstruction. No evidence of bowel inflammation. Vascular/Lymphatic: No acute vascular abnormality. Scattered atherosclerotic calcifications. No mass or adenopathy. Reproductive:Pelvic floor laxity.  Question pelvic sling surgery. Other: Small volume perihepatic ascites.  Prominent anasarca Musculoskeletal: Severe and diffuse lumbar spine degeneration with L4-5 anterolisthesis. Bilateral degenerative hip narrowing. IMPRESSION: 1. Mild fat stranding  around the pancreas, likely from generalized volume overload but please correlate with serum enzymes. 2. Small volume ascites accumulated around the liver. 3. Cardiomegaly and atherosclerosis. Electronically Signed   By: Monte Fantasia M.D.   On: 01/17/2019 04:38   Dg Chest 1 View  Result Date: 01/17/2019 CLINICAL DATA:  Shortness of breath EXAM: CHEST  1 VIEW COMPARISON:  December 13, 2018 FINDINGS: There is stable cardiomegaly. Pulmonary vascular congestion and mild increased interstitial markings throughout both lungs. No focal airspace consolidation. Bilateral shoulder arthritis is seen. IMPRESSION: Cardiomegaly with pulmonary vascular congestion and interstitial edema. Electronically Signed   By: Prudencio Pair M.D.   On:  01/17/2019 00:55   US Venous Img Lower Unilateral Left  Result Date: 01/17/2019 CLINICAL DATA:  Left lower extremity edema for the past month. Former smoker. Patient is on anticoagulation. Evaluate for DVT. EXAM: LEFT LOWER EXTREMITY VENOUS DOPPLER ULTRASOUND TECHNIQUE: Gray-scale sonography with graded compression, as well as color Doppler and duplex ultrasound were performed to evaluate the lower extremity deep venous systems from the level of the common femoral vein and including the common femoral, femoral, profunda femoral, popliteal and calf veins including the posterior tibial, peroneal and gastrocnemius veins when visible. The superficial great saphenous vein was also interrogated. Spectral Doppler was utilized to evaluate flow at rest and with distal augmentation maneuvers in the common femoral, femoral and popliteal veins. COMPARISON:  None. FINDINGS: Examination is degraded due to patient body habitus and poor sonographic window. Contralateral Common Femoral Vein: Respiratory phasicity is normal and symmetric with the symptomatic side. No evidence of thrombus. Normal compressibility. Common Femoral Vein: No evidence of thrombus. Normal compressibility, respiratory phasicity and response to augmentation. Saphenofemoral Junction: No evidence of thrombus. Normal compressibility and flow on color Doppler imaging. Profunda Femoral Vein: No evidence of thrombus. Normal compressibility and flow on color Doppler imaging. Femoral Vein: No evidence of thrombus. Normal compressibility, respiratory phasicity and response to augmentation. Popliteal Vein: No evidence of thrombus. Normal compressibility, respiratory phasicity and response to augmentation. Calf Veins: No evidence of thrombus. Normal compressibility and flow on color Doppler imaging. Superficial Great Saphenous Vein: No evidence of thrombus. Normal compressibility. Venous Reflux:  None. Other Findings: Note is made of an approximately 5.9 x 2.6 x 1.5  cm serpiginous fluid collection with left popliteal fossa compatible with a Baker cyst, similar to the 09/2018 examination, previously, 6.5 x 2.4 x 1.6 cm. Minimal subcutaneous edema is seen at the level the left calf. IMPRESSION: 1. No evidence of DVT within left lower extremity. 2. 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 Electronically Signed   By: Sandi Mariscal M.D.   On: 01/17/2019 16:41     Medications:    Current Facility-Administered Medications (Endocrine & Metabolic):  .  insulin aspart (novoLOG) injection 0-5 Units .  insulin aspart (novoLOG) injection 0-9 Units .  levothyroxine (SYNTHROID) tablet 50 mcg   Current Facility-Administered Medications (Cardiovascular):  .  amiodarone (PACERONE) tablet 200 mg .  atorvastatin (LIPITOR) tablet 40 mg .  furosemide (LASIX) injection 40 mg .  milrinone (PRIMACOR) 20 MG/100 ML (0.2 mg/mL) infusion     Current Facility-Administered Medications (Analgesics):  .  acetaminophen (TYLENOL) tablet 650 mg .  morphine 4 MG/ML injection 4 mg   Current Facility-Administered Medications (Hematological):  .  albumin human 25 % solution 12.5 g   Current Facility-Administered Medications (Other):  .  0.9 %  sodium chloride infusion (Manually program via Guardrails IV Fluids) .  0.9 %  sodium  chloride infusion (Manually program via Guardrails IV Fluids) .  0.9 %  sodium chloride infusion .  cefTRIAXone (ROCEPHIN) 1 g in sodium chloride 0.9 % 100 mL IVPB .  Chlorhexidine Gluconate Cloth 2 % PADS 6 each .  ondansetron (ZOFRAN) injection 4 mg .  pantoprazole (PROTONIX) 80 mg in sodium chloride 0.9 % 250 mL (0.32 mg/mL) infusion .  [START ON 01/20/2019] pantoprazole (PROTONIX) injection 40 mg .  sodium chloride flush (NS) 0.9 % injection 3 mL .  sodium chloride flush (NS) 0.9 % injection 3 mL  No current outpatient medications on file.    Assessment/ Plan:  67 y.o. female with  medical problems of chronic systolic  congestive heart failure, coronary disease, atrial fibrillation requiring anticoagulation, diabetes, anemia, carotid artery disease with history of left carotid endarterectomy, previous stroke, hypertension, chronic kidney disease, was admitted on 01/17/2019 with shortness of breath worsening edema, acute kidney injury.   1.  Acute kidney injury 2.  Chronic kidney disease stage III 3.  Shortness of breath 4.  Generalized edema 5.  Severe anemia 6.  Diabetes, insulin-dependent.  Hemoglobin A1c 7.4% December 14, 2018 7.  Hematuria 8.  Proteinuria 9.  Severe chronic systolic congestive heart failure LVEF 25 to 30% 10.  Pulmonary hypertension 11.  Atrial fibrillation requiring anticoagulation 12.  Hyponatremia  Abdominal imaging so far shows diffuse lumbar spine degeneration, fat stranding around pancreas from generalized volume overload, ascites, cardiomegaly and atherosclerosis Chest x-ray consistent with pulmonary vascular congestion 2D echo from September 5 shows severely reduced LVEF 20 to 30% mildly dilated left ventricular cavity, diffuse hypokinesis, moderately enlarged right ventricular cavity with elevated pulmonary pressures of 56 mm, severely dilated left atrium, moderately dilated right atrium, moderate tricuspid regurgitation  Acute kidney injury is likely secondary to cardiorenal syndrome.  Patient has relative hypotension which may be leading to renal hypoperfusion.  She has underlying chronic kidney disease secondary to diabetes and hypertension.  Patient is also noted to have severe chronic congestive heart failure and hypoalbuminemia.  Recommend: - Avoid hypotension, - improving with inotropic therapy as directed by cardiology team - Supplement IV albumin 12.5 g daily - furosemide 40 mg iv q 12 hrs at present -  Serum creatinine continues to be critically elevated but trend is improving today - Monitor electrolytes and renal function daily - serologies results  pending    LOS: [place] 10/10/202011:40 AM  [place], [place]  Note: This note was prepared with Dragon dictation. Any transcription errors are unintentional

## 2019-01-19 DIAGNOSIS — I5023 Acute on chronic systolic (congestive) heart failure: Secondary | ICD-10-CM | POA: Diagnosis not present

## 2019-01-19 LAB — BASIC METABOLIC PANEL
Anion gap: 10 (ref 5–15)
BUN: 72 mg/dL — ABNORMAL HIGH (ref 8–23)
CO2: 25 mmol/L (ref 22–32)
Calcium: 7.7 mg/dL — ABNORMAL LOW (ref 8.9–10.3)
Chloride: 101 mmol/L (ref 98–111)
Creatinine, Ser: 3.25 mg/dL — ABNORMAL HIGH (ref 0.44–1.00)
GFR calc Af Amer: 16 mL/min — ABNORMAL LOW (ref >60)
GFR calc non Af Amer: 14 mL/min — ABNORMAL LOW (ref >60)
Glucose, Bld: 158 mg/dL — ABNORMAL HIGH (ref 70–99)
Potassium: 4 mmol/L (ref 3.5–5.1)
Sodium: 136 mmol/L (ref 135–145)

## 2019-01-19 LAB — GLUCOSE, CAPILLARY
Glucose-Capillary: 127 mg/dL — ABNORMAL HIGH (ref 70–99)
Glucose-Capillary: 162 mg/dL — ABNORMAL HIGH (ref 70–99)
Glucose-Capillary: 209 mg/dL — ABNORMAL HIGH (ref 70–99)
Glucose-Capillary: 221 mg/dL — ABNORMAL HIGH (ref 70–99)
Glucose-Capillary: 226 mg/dL — ABNORMAL HIGH (ref 70–99)
Glucose-Capillary: 238 mg/dL — ABNORMAL HIGH (ref 70–99)

## 2019-01-19 LAB — CBC
HCT: 22.5 % — ABNORMAL LOW (ref 36.0–46.0)
HCT: 23.4 % — ABNORMAL LOW (ref 36.0–46.0)
Hemoglobin: 7 g/dL — ABNORMAL LOW (ref 12.0–15.0)
Hemoglobin: 7.1 g/dL — ABNORMAL LOW (ref 12.0–15.0)
MCH: 25.7 pg — ABNORMAL LOW (ref 26.0–34.0)
MCH: 25.7 pg — ABNORMAL LOW (ref 26.0–34.0)
MCHC: 31.1 g/dL (ref 30.0–36.0)
MCHC: 30.3 g/dL (ref 30.0–36.0)
MCV: 82.7 fL (ref 80.0–100.0)
MCV: 84.8 fL (ref 80.0–100.0)
Platelets: 185 10*3/uL (ref 150–400)
Platelets: 176 10*3/uL (ref 150–400)
RBC: 2.72 MIL/uL — ABNORMAL LOW (ref 3.87–5.11)
RBC: 2.76 MIL/uL — ABNORMAL LOW (ref 3.87–5.11)
RDW: 18.5 % — ABNORMAL HIGH (ref 11.5–15.5)
RDW: 19 % — ABNORMAL HIGH (ref 11.5–15.5)
WBC: 5.3 10*3/uL (ref 4.0–10.5)
WBC: 5.5 10*3/uL (ref 4.0–10.5)
nRBC: 0 % (ref 0.0–0.2)
nRBC: 0 % (ref 0.0–0.2)

## 2019-01-19 LAB — MPO/PR-3 (ANCA) ANTIBODIES
ANCA Proteinase 3: <3.5 U/mL (ref 0.0–3.5)
Myeloperoxidase Abs: <9 U/mL (ref 0.0–9.0)

## 2019-01-19 LAB — URINE CULTURE
Culture: >=100000 — AB
Special Requests: NORMAL

## 2019-01-19 LAB — VITAMIN B12: Vitamin B-12: 893 pg/mL (ref 180–914)

## 2019-01-19 MED ORDER — FUROSEMIDE 10 MG/ML IJ SOLN
40.0000 mg | Freq: Three times a day (TID) | INTRAMUSCULAR | Status: DC
  Administered 2019-01-19 – 2019-01-20 (×3): 40 mg via INTRAVENOUS
  Filled 2019-01-19 (×3): qty 4

## 2019-01-19 NOTE — Progress Notes (Signed)
Pt requested to sit up in the bedside recliner. Confirmed pt's readiness for mobility with PT and Dr. Mortimer Fries. Used a three assist and walker. PT tolerated well with active ROM of motion in all extremities. Pt does states have a limited ROM in right arm and hand.Dyspnea upon exertion. Pt had a CHG wipe down and Interdry was place under her breast bilaterally and lower mid region folds. Pt states improvement in comfort. Will continue to monitor.

## 2019-01-19 NOTE — Progress Notes (Signed)
Pt  Sleeping well. Daughter at the bedside. Has been update earlier. Will continue to monitor.

## 2019-01-19 NOTE — Consult Note (Signed)
MEDICATION RELATED CONSULT NOTE - INITIAL   Pharmacy Consult for Milrinone Indication: inotropic support  No Known Allergies  Patient Measurements: Height: 5\' 7"  (170.2 cm) Weight: (!) 306 lb 3.5 oz (138.9 kg) IBW/kg (Calculated) : 61.6  Vital Signs: Temp: 98.1 F (36.7 C) (10/11 0800) Temp Source: Axillary (10/11 0800) BP: 115/71 (10/11 0900) Pulse Rate: 111 (10/11 0900)  Estimated Creatinine Clearance: 23.5 mL/min (A) (by C-G formula based on SCr of 3.25 mg/dL (H)).  Medical History: Past Medical History:  Diagnosis Date  . Carotid arterial disease ([place])    a. 02/2012 s/p L CEA.  . CKD stage G3b/A1, GFR 30-44 and albumin creatinine ratio <30 mg/g   . Coronary artery disease    a. 02/2012 Lexi MV: Anterior, anteroseptal, septal, apical infarct with mild peri-infarct ischemia, EF 41%- medically managed due to anemia; b. 03/2017 Cath: LM nl, LAD 99ost/p/m - faint L->L collaterals, RI 20ost, LCX 32m, RCA 20p, 46m-->Med Rx for chronic subtotal LAD dzs. Avoid R Radial access in future.  . 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 Hyperlipidemia   . Hypertension   . Ischemic cardiomyopathy    a. 06/2013 Echo: EF 35-40%, glob HK, mildly dil LA, mild LVH, mild TR, mild to mod MR; b. 07/2016 Echo: EF 25-30%, sev mid-apicalanteroseptal, ant, apical HK, mild MR, sev dil LA, mod dil RA, mod TR, PASP 28mmHg; c. 11/2016 Echo: EF 20-25%, antsept DK, Gr2 DD, mild MR, mod dil LA/RV/RA, mod red RV fxn, mod-sev TR, PASP 40-45mmHg.  . Morbid obesity ([place])   . Normocytic anemia   . Persistent atrial fibrillation (HCC)    a. CHA2DS2VASc = 7-->coumadin; maintaining sinus on amio.  . Stroke [facility]) 2013   Assessment: CK is a 5 YOF who presented for evaluation of CHF. Patient has been in and out of atrial fibrillation since 2018, with a recent hospitalization in 12/2018 which she was found to have an EF of 30-35%, moderate pulmonary HTN with mildly reduced RV systolic function, and a  severely dilated left atrium.  She underwent cardioversion on 01/03/2019 which was briefly successful, but returned to atrial fibrillation at follow-up on 01/09/2019.  At this visit, she noted continuous weight gain, severe leg edema, and dyspnea. Her Lasix 40mg  PO BID was changed to torsemide 40mg  PO BID and remained on low-dose carvedilol and losartan.  Over the past week, patient has been more somnolent with continued lower extremity swelling and shortness of breath, prompting an ED visit this morning. She was found to be significantly volume overloaded and anemic. Due to presenting with hypotension and acute on chronic kidney disease, carvedilol and losartan were held. Patient was given 40mg  IV Lasic in ED with no document UOP. BP has remained soft in the 90s over 60s. Per cardiology, patient to be on milrinone for inotropic support for suspected cardiorenal syndrome.  Plan:  - Defer to CCM and cardiology for titration to achieve goals of therapy - Start on the low end of dosing (0.136mcg/kg/min), may be up-titrated to a recommended maximum dose of 0.381mcg/kg/min based on CrCl of 10-9mL/min. Titrating to >0.375 mcg/kg/minute in significant renal impairment is generally not recommended due to likelihood for accumulation  - MAP @ 86.   Meghan [name], PharmD, BCPS 01/19/2019,1:38 PM

## 2019-01-19 NOTE — Progress Notes (Signed)
[facility] [place] NAME: Morrigan Asti    MR#:  [id number]  DATE OF BIRTH:  Feb 02, 1951  SUBJECTIVE:  CHIEF COMPLAINT:   Chief Complaint  Patient presents with  . Shortness of Breath   NO bleeding Feels better   REVIEW OF SYSTEMS:    Review of Systems  Constitutional: Positive for malaise/fatigue. Negative for chills and fever.  HENT: Negative for sore throat.   Eyes: Negative for blurred vision, double vision and pain.  Respiratory: Positive for shortness of breath. Negative for cough, hemoptysis and wheezing.   Cardiovascular: Positive for leg swelling. Negative for chest pain, palpitations and orthopnea.  Gastrointestinal: Negative for abdominal pain, constipation, diarrhea, heartburn, nausea and vomiting.  Genitourinary: Negative for dysuria and hematuria.  Musculoskeletal: Negative for back pain and joint pain.  Skin: Negative for rash.  Neurological: Negative for sensory change, speech change, focal weakness and headaches.  Endo/Heme/Allergies: Does not bruise/bleed easily.  Psychiatric/Behavioral: Negative for depression. The patient is not nervous/anxious.     DRUG ALLERGIES:  No Known Allergies  VITALS:  Blood pressure 115/71, pulse (!) 111, temperature 98.1 F (36.7 C), temperature source Axillary, resp. rate 17, height 5\' 7"  (1.702 m), weight (!) 138.9 kg, SpO2 91 %.  PHYSICAL EXAMINATION:   Physical Exam  GENERAL:  68 y.o.-year-old patient lying in the bed with no acute distress.  EYES: Pupils equal, round, reactive to light and accommodation. No scleral icterus. Extraocular muscles intact.  HEENT: Head atraumatic, normocephalic. Oropharynx and nasopharynx clear.  NECK:  Supple, no jugular venous distention. No thyroid enlargement, no tenderness.  LUNGS: Normal breath sounds bilaterally, no wheezing, rales, rhonchi. No use of accessory muscles of respiration.  CARDIOVASCULAR: S1, S2 normal. No murmurs, rubs, or  gallops.  ABDOMEN: Soft, nontender, nondistended. Bowel sounds present. No organomegaly or mass.  EXTREMITIES: No cyanosis, clubbing . LE edema NEUROLOGIC: Cranial nerves II through XII are intact. No focal Motor or sensory deficits b/l.   PSYCHIATRIC: The patient is alert and awake SKIN: No obvious rash, lesion, or ulcer.   LABORATORY PANEL:   CBC Recent Labs  Lab 01/19/19 0526  WBC 5.3  HGB 7.0*  HCT 22.5*  PLT 185   ------------------------------------------------------------------------------------------------------------------ Chemistries  Recent Labs  Lab 01/17/19 1305 01/18/19 0654 01/19/19 0526  NA  --  136 136  K  --  4.6 4.0  CL  --  101 101  CO2  --  24 25  GLUCOSE  --  70 158*  BUN  --  71* 72*  CREATININE  --  3.49* 3.25*  CALCIUM  --  7.8* 7.7*  MG  --  2.3  --   AST 42*  --   --   ALT 23  --   --   ALKPHOS 79  --   --   BILITOT 1.2  --   --    ------------------------------------------------------------------------------------------------------------------  Cardiac Enzymes No results for input(s): TROPONINI in the last 168 hours. ------------------------------------------------------------------------------------------------------------------  RADIOLOGY:  No results found.   ASSESSMENT AND PLAN:   68 y.o. female with pertinent past medical history of chronic systolic heart failure CAD, persistent atrial fibrillation s/p cardioversion on Coumadin, diabetes mellitus, chronic anemia, CKD, hyperlipidemia, carotid artery disease s/p left carotid endarterectomy with previous CVA, and hypertension into the ED with chief complaints of right flank pain, shortness of breath and rectal bleed  * Acute on chronicsystolicCongestive Heart Failure Last Echo 12/2018 with severely reduced LV EF 20-35% - Hold  Coreg and losartan due to hypotension on presentation - IV lasix -CardiologyConsulted  * GI Bleed-patient presenting with rectal bleed found to have  hemoglobin 6.7 dropped from 10.3 - Status post vitamin K to reverse Coumadin Transfused but Hb trending down again and will need transfusion GI consulted and following  * AKI on CKD 3  Likely cardiorenal syndrome Nephrology consulted ON lasix Monitor   * Persistent atrial fibrillation - s/p cardioversion still in A. Fib - Continue amiodarone per cardiology - Hold Coumadin for now - We will re-dose per pharmacy - Continue to monitor INR  * HTN  -Hold Antihypertensive due to hypotension  * Diabetes mellitus - Recent hgb A1c 7.2 - Hold Glipizide - SSI  * Hypothyroidism -continue levothyroxine  * History of CVA without residual deficit  -Holding Coumadin for now in the setting of active GI bleed.  All the records are reviewed and case discussed with Care Management/Social Worker Management plans discussed with the patient, family and they are in agreement.  CODE STATUS: FULL CODE  TOTAL TIME TAKING CARE OF THIS PATIENT: 35 minutes.   POSSIBLE D/C IN 1-2 DAYS, DEPENDING ON CLINICAL CONDITION.  Leia Alf Surabhi Gadea M.D on 01/19/2019 at 1:06 PM  Between 7am to 6pm - Pager - [phone]  After 6pm go to [email or web address] - password [email or web address] [place] [facility]  Office  [phone]  CC: Primary care physician; [name], MD  Note: This dictation was prepared with Dragon dictation along with smaller phrase technology. Any transcriptional errors that result from this process are unintentional.

## 2019-01-19 NOTE — Progress Notes (Signed)
Progress Note  Patient Name: Meghan [name] Date of Encounter: 01/19/2019  Primary Cardiologist: Kathlyn Sacramento, MD    Patient Profile     68 y.o. female admitted with acute respiratory failure, acute/chronic systolic heart failure atrial fibrillation-persistent with recent cardioversion with failure to maintain sinus, admitted with progressive weakness and found to have hemoglobin of 6.7.  Was 11.3  12/13/2018 and 10.3 12/30/2018 Acute kidney injury, felt to have incipient cardiorenal syndrome and was started on milrinone (1.15-3/20   2.44-9/15/2020  Subjective   Feels better still diuresing some hip pain   Inpatient Medications    Scheduled Meds:  sodium chloride   Intravenous Once   sodium chloride   Intravenous Once   amiodarone  200 mg Oral BID   atorvastatin  40 mg Oral Daily   Chlorhexidine Gluconate Cloth  6 each Topical Q0600   furosemide  40 mg Intravenous Q12H   insulin aspart  0-5 Units Subcutaneous QHS   insulin aspart  0-9 Units Subcutaneous TID WC   levothyroxine  50 mcg Oral QAC breakfast    morphine injection  4 mg Intravenous Once   [START ON 01/20/2019] pantoprazole  40 mg Intravenous Q12H   sodium chloride flush  3 mL Intravenous Q12H   Continuous Infusions:  sodium chloride Stopped (01/17/19 1010)   albumin human Stopped (01/18/19 1041)   cefTRIAXone (ROCEPHIN)  IV 1 g (01/19/19 0852)   milrinone 0.125 mcg/kg/min (01/19/19 0038)   pantoprozole (PROTONIX) infusion 8 mg/hr (01/19/19 0533)   PRN Meds: sodium chloride, acetaminophen, HYDROcodone-acetaminophen, ondansetron (ZOFRAN) IV, polyethylene glycol, sodium chloride flush   Vital Signs    Vitals:   01/19/19 0500 01/19/19 0700 01/19/19 0800 01/19/19 0900  BP:  109/71 124/76 115/71  Pulse:  (!) 114 (!) 115 (!) 111  Resp:  13 16 17   Temp:   98.1 F (36.7 C)   TempSrc:   Axillary   SpO2:  96% 92% 91%  Weight: (!) 138.9 kg     Height:        Intake/Output Summary (Last 24  hours) at 01/19/2019 1005 Last data filed at 01/19/2019 0900 Gross per 24 hour  Intake 1306.61 ml  Output 2475 ml  Net -1168.39 ml   Last 3 Weights 01/19/2019 01/17/2019 01/17/2019  Weight (lbs) 306 lb 3.5 oz 306 lb 7 oz 265 lb  Weight (kg) 138.9 kg 139 kg 120.203 kg      Telemetry       *afib modest VR - Personally Reviewed  ECG     Physical Exam    Well developed and nourished in no acute distress HENT normal Neck supple with JVP-8 Carotids brisk and full without bruits Crackles  Irregularly irregular rate and rhythm withrapid ventricular response, no murmurs or gallops Abd-soft with active BS without hepatomegaly No Clubbing cyanosis 2+edema Skin-warm and dry A & Oriented  Grossly normal sensory and motor function   Labs    High Sensitivity Troponin:   Recent Labs  Lab 01/17/19 0011  TROPONINIHS 13      Chemistry Recent Labs  Lab 01/17/19 0011 01/17/19 1305 01/18/19 0654 01/19/19 0526  NA 133*  --  136 136  K 5.0  --  4.6 4.0  CL 100  --  101 101  CO2 24  --  24 25  GLUCOSE 119*  --  70 158*  BUN 74*  --  71* 72*  CREATININE 3.82*  --  3.49* 3.25*  CALCIUM 7.7*  --  7.8* 7.7*  PROT 7.7 7.1  --   --   ALBUMIN 2.5* 2.2*  --   --   AST 42* 42*  --   --   ALT 23 23  --   --   ALKPHOS 103 79  --   --   BILITOT 1.3* 1.2  --   --   GFRNONAA 11*  --  13* 14*  GFRAA 13*  --  15* 16*  ANIONGAP 9  --  11 10     Hematology Recent Labs  Lab 01/18/19 0654 01/18/19 1025 01/18/19 2339 01/19/19 0526  WBC 6.9  --  6.2 5.3  RBC 2.66*  --  2.78* 2.72*  HGB 7.0* 6.7* 7.2* 7.0*  HCT 21.9* 21.5* 23.1* 22.5*  MCV 82.3  --  83.1 82.7  MCH 26.3  --  25.9* 25.7*  MCHC 32.0  --  31.2 31.1  RDW 18.5*  --  18.6* 18.5*  PLT 185  --  196 185    BNP Recent Labs  Lab 01/17/19 0011  BNP 790.0*     DDimer No results for input(s): DDIMER in the last 168 hours.   Radiology    US Venous Img Lower Unilateral Left  Result Date: 01/17/2019 CLINICAL DATA:   Left lower extremity edema for the past month. Former smoker. Patient is on anticoagulation. Evaluate for DVT. EXAM: LEFT LOWER EXTREMITY VENOUS DOPPLER ULTRASOUND TECHNIQUE: Gray-scale sonography with graded compression, as well as color Doppler and duplex ultrasound were performed to evaluate the lower extremity deep venous systems from the level of the common femoral vein and including the common femoral, femoral, profunda femoral, popliteal and calf veins including the posterior tibial, peroneal and gastrocnemius veins when visible. The superficial great saphenous vein was also interrogated. Spectral Doppler was utilized to evaluate flow at rest and with distal augmentation maneuvers in the common femoral, femoral and popliteal veins. COMPARISON:  None. FINDINGS: Examination is degraded due to patient body habitus and poor sonographic window. Contralateral Common Femoral Vein: Respiratory phasicity is normal and symmetric with the symptomatic side. No evidence of thrombus. Normal compressibility. Common Femoral Vein: No evidence of thrombus. Normal compressibility, respiratory phasicity and response to augmentation. Saphenofemoral Junction: No evidence of thrombus. Normal compressibility and flow on color Doppler imaging. Profunda Femoral Vein: No evidence of thrombus. Normal compressibility and flow on color Doppler imaging. Femoral Vein: No evidence of thrombus. Normal compressibility, respiratory phasicity and response to augmentation. Popliteal Vein: No evidence of thrombus. Normal compressibility, respiratory phasicity and response to augmentation. Calf Veins: No evidence of thrombus. Normal compressibility and flow on color Doppler imaging. Superficial Great Saphenous Vein: No evidence of thrombus. Normal compressibility. Venous Reflux:  None. Other Findings: Note is made of an approximately 5.9 x 2.6 x 1.5 cm serpiginous fluid collection with left popliteal fossa compatible with a Baker cyst, similar to  the 09/2018 examination, previously, 6.5 x 2.4 x 1.6 cm. Minimal subcutaneous edema is seen at the level the left calf. IMPRESSION: 1. No evidence of DVT within left lower extremity. 2. 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 Electronically Signed   By: Sandi Mariscal M.D.   On: 01/17/2019 16:41    Cardiac Studies   ECG 10/8 narrow QRS  Echo 9/20 EF 20-25% Echo 2018 EF 20-25%    Assessment & Plan    Atrial fibrillation-persistent failed cardioversion  Anemia-severe  Acute renal injury  Cardiomyopathy-ischemic EF 20-25%   Hypotension  Renal insufficiency improved today-- ongoing modest diuresis  Will increase  frequency as Cr is continuing to come down   I would not invoke cardiorenal syndrome in this lady given her acute anemia which, with organ hypoperfusion, may aggravate much of what we are seeing Would transfuse to Hgb of 9 given acute decompensation   Continue amio for rate control until BP and renal function allow other options    For questions or updates, please contact [facility] Please consult [email or web address] for contact info under        Signed, Virl Axe, MD  01/19/2019, 10:05 AM

## 2019-01-19 NOTE — Progress Notes (Signed)
A&O X 4. Talkative. States that she has a back ache , but slept well. Pain level of 2 on a 0-10 pain scale.  Pt refuses pain medication at this time. Repositioned the patient and will continue to monitor.

## 2019-01-19 NOTE — Progress Notes (Signed)
Pt. Transferred  back to bed with 2 assist.Tolerated well, with dyspnea on exertion. Pt recovered well. Stated having pain of 6 on 0-10 pain scale. See MAR. Will continue to monitor.

## 2019-01-19 NOTE — Progress Notes (Signed)
[facility], [place] 01/19/19  Subjective:   continued on milrinone.  Has good response.    10/10 0701 - 10/11 0700 In: 1737.2 [P.O.:1020; I.V.:593.5; IV Piggyback:123.7] Out: 975 [Urine:975]  States she feels better everyday Got up out of bed yesterday Patient is able to eat and drink without any nausea or vomiting.  Objective:  Vital signs in last 24 hours:  Temp:  [96.9 F (36.1 C)-97.9 F (36.6 C)] 97.7 F (36.5 C) (10/11 0000) Pulse Rate:  [85-115] 111 (10/11 0900) Resp:  [10-17] 17 (10/11 0900) BP: (95-124)/(44-76) 115/71 (10/11 0900) SpO2:  [91 %-99 %] 91 % (10/11 0900) Weight:  [138.9 kg] 138.9 kg (10/11 0500)  Weight change:  Filed Weights   01/17/19 0001 01/17/19 0439 01/19/19 0500  Weight: 120.2 kg (!) 139 kg (!) 138.9 kg    Intake/Output:    Intake/Output Summary (Last 24 hours) at 01/19/2019 0930 Last data filed at 01/19/2019 0900 Gross per 24 hour  Intake 1436.82 ml  Output 2475 ml  Net -1038.18 ml    Physical Exam: General:  No acute distress, obese female, laying in the bed  HEENT  moist oral mucous membranes  Neck:  Short  Lungs:  mild crackles, [place] O2  Heart::  Regular rhythm   Abdomen:  Soft, nontender  Extremities:  2-3+ pitting edema  Neurologic:  Alert, able to answer questions  Skin:  Warm, dry  Foley in place    Basic Metabolic Panel:  Recent Labs  Lab 01/17/19 0011 01/18/19 0654 01/19/19 0526  NA 133* 136 136  K 5.0 4.6 4.0  CL 100 101 101  CO2 24 24 25   GLUCOSE 119* 70 158*  BUN 74* 71* 72*  CREATININE 3.82* 3.49* 3.25*  CALCIUM 7.7* 7.8* 7.7*  MG  --  2.3  --   PHOS  --  5.2*  --      CBC: Recent Labs  Lab 01/17/19 0011  01/17/19 2156 01/18/19 0654 01/18/19 1025 01/18/19 2339 01/19/19 0526  WBC 6.6  --   --  6.9  --  6.2 5.3  NEUTROABS 4.7  --   --   --   --   --   --   HGB 6.7*   < > 7.3* 7.0* 6.7* 7.2* 7.0*  HCT 21.7*   < > 22.8* 21.9* 21.5* 23.1* 22.5*  MCV 81.9  --   --   82.3  --  83.1 82.7  PLT 193  --   --  185  --  196 185   < > = values in this interval not displayed.     No results found for: HEPBSAG, HEPBSAB, HEPBIGM    Microbiology:  Recent Results (from the past 240 hour(s))  SARS Coronavirus 2 by RT PCR (hospital order, performed in [facility] hospital lab) Nasopharyngeal Nasopharyngeal Swab     Status: None   Collection Time: 01/17/19  1:41 AM   Specimen: Nasopharyngeal Swab  Result Value Ref Range Status   SARS Coronavirus 2 NEGATIVE NEGATIVE Final    Comment: (NOTE) If result is NEGATIVE SARS-CoV-2 target nucleic acids are NOT DETECTED. The SARS-CoV-2 RNA is generally detectable in upper and lower  respiratory specimens during the acute phase of infection. The lowest  concentration of SARS-CoV-2 viral copies this assay can detect is 250  copies / mL. A negative result does not preclude SARS-CoV-2 infection  and should not be used as the sole basis for treatment or other  patient management decisions.  A negative result may occur with  improper specimen collection / handling, submission of specimen other  than nasopharyngeal swab, presence of viral mutation(s) within the  areas targeted by this assay, and inadequate number of viral copies  (<250 copies / mL). A negative result must be combined with clinical  observations, patient history, and epidemiological information. If result is POSITIVE SARS-CoV-2 target nucleic acids are DETECTED. The SARS-CoV-2 RNA is generally detectable in upper and lower  respiratory specimens dur ing the acute phase of infection.  Positive  results are indicative of active infection with SARS-CoV-2.  Clinical  correlation with patient history and other diagnostic information is  necessary to determine patient infection status.  Positive results do  not rule out bacterial infection or co-infection with other viruses. If result is PRESUMPTIVE POSTIVE SARS-CoV-2 nucleic acids MAY BE PRESENT.   A  presumptive positive result was obtained on the submitted specimen  and confirmed on repeat testing.  While 2019 novel coronavirus  (SARS-CoV-2) nucleic acids may be present in the submitted sample  additional confirmatory testing may be necessary for epidemiological  and / or clinical management purposes  to differentiate between  SARS-CoV-2 and other Sarbecovirus currently known to infect humans.  If clinically indicated additional testing with an alternate test  methodology [phone]) is advised. The SARS-CoV-2 RNA is generally  detectable in upper and lower respiratory sp ecimens during the acute  phase of infection. The expected result is Negative. Fact Sheet for Patients:  StrictlyIdeas.no Fact Sheet for Healthcare Providers: BankingDealers.co.za This test is not yet approved or cleared by the [place] FDA and has been authorized for detection and/or diagnosis of SARS-CoV-2 by FDA under an Emergency Use Authorization (EUA).  This EUA will remain in effect (meaning this test can be used) for the duration of the COVID-19 declaration under Section 564(b)(1) of the Act, 21 U.S.C. section 360bbb-3(b)(1), unless the authorization is terminated or revoked sooner. Performed at [facility], [address] [place], [place] [address]   MRSA PCR Screening     Status: None   Collection Time: 01/17/19  4:44 AM   Specimen: Nasal Mucosa; Nasopharyngeal  Result Value Ref Range Status   MRSA by PCR NEGATIVE NEGATIVE Final    Comment:        The GeneXpert MRSA Assay (FDA approved for NASAL specimens only), is one component of a comprehensive MRSA colonization surveillance program. It is not intended to diagnose MRSA infection nor to guide or monitor treatment for MRSA infections. Performed at [facility], [address], [place], [place] [address]   Urine Culture     Status: Abnormal   Collection Time: 01/17/19  11:52 AM   Specimen: Urine, Clean Catch  Result Value Ref Range Status   Specimen Description   Final    URINE, CLEAN CATCH Performed at [facility], [address], [place], [place] [address]    Special Requests   Final    Normal Performed at [facility], [place], [place] [address]    Culture >=100,000 COLONIES/mL ESCHERICHIA COLI (A)  Final   Report Status 01/19/2019 FINAL  Final   Organism ID, Bacteria ESCHERICHIA COLI (A)  Final      Susceptibility   Escherichia coli - MIC*    AMPICILLIN >=32 RESISTANT Resistant     CEFAZOLIN <=4 SENSITIVE Sensitive     CEFTRIAXONE <=1 SENSITIVE Sensitive     CIPROFLOXACIN >=4 RESISTANT Resistant     GENTAMICIN <=1 SENSITIVE  Sensitive     IMIPENEM <=0.25 SENSITIVE Sensitive     NITROFURANTOIN <=16 SENSITIVE Sensitive     TRIMETH/SULFA <=20 SENSITIVE Sensitive     AMPICILLIN/SULBACTAM 8 SENSITIVE Sensitive     PIP/TAZO <=4 SENSITIVE Sensitive     Extended ESBL NEGATIVE Sensitive     * >=100,000 COLONIES/mL ESCHERICHIA COLI    Coagulation Studies: Recent Labs    01/17/19 0011 01/17/19 1518  LABPROT 24.3* 24.4*  INR 2.2* 2.2*    Urinalysis: Recent Labs    01/17/19 1152  COLORURINE YELLOW*  LABSPEC 1.009  PHURINE 5.0  GLUCOSEU NEGATIVE  HGBUR MODERATE*  BILIRUBINUR NEGATIVE  KETONESUR NEGATIVE  PROTEINUR NEGATIVE  NITRITE NEGATIVE  LEUKOCYTESUR LARGE*      Imaging: US Venous Img Lower Unilateral Left  Result Date: 01/17/2019 CLINICAL DATA:  Left lower extremity edema for the past month. Former smoker. Patient is on anticoagulation. Evaluate for DVT. EXAM: LEFT LOWER EXTREMITY VENOUS DOPPLER ULTRASOUND TECHNIQUE: Gray-scale sonography with graded compression, as well as color Doppler and duplex ultrasound were performed to evaluate the lower extremity deep venous systems from the level of the common femoral vein and including the common femoral, femoral, profunda femoral, popliteal and  calf veins including the posterior tibial, peroneal and gastrocnemius veins when visible. The superficial great saphenous vein was also interrogated. Spectral Doppler was utilized to evaluate flow at rest and with distal augmentation maneuvers in the common femoral, femoral and popliteal veins. COMPARISON:  None. FINDINGS: Examination is degraded due to patient body habitus and poor sonographic window. Contralateral Common Femoral Vein: Respiratory phasicity is normal and symmetric with the symptomatic side. No evidence of thrombus. Normal compressibility. Common Femoral Vein: No evidence of thrombus. Normal compressibility, respiratory phasicity and response to augmentation. Saphenofemoral Junction: No evidence of thrombus. Normal compressibility and flow on color Doppler imaging. Profunda Femoral Vein: No evidence of thrombus. Normal compressibility and flow on color Doppler imaging. Femoral Vein: No evidence of thrombus. Normal compressibility, respiratory phasicity and response to augmentation. Popliteal Vein: No evidence of thrombus. Normal compressibility, respiratory phasicity and response to augmentation. Calf Veins: No evidence of thrombus. Normal compressibility and flow on color Doppler imaging. Superficial Great Saphenous Vein: No evidence of thrombus. Normal compressibility. Venous Reflux:  None. Other Findings: Note is made of an approximately 5.9 x 2.6 x 1.5 cm serpiginous fluid collection with left popliteal fossa compatible with a Baker cyst, similar to the 09/2018 examination, previously, 6.5 x 2.4 x 1.6 cm. Minimal subcutaneous edema is seen at the level the left calf. IMPRESSION: 1. No evidence of DVT within left lower extremity. 2. 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 Electronically Signed   By: Sandi Mariscal M.D.   On: 01/17/2019 16:41     Medications:    Current Facility-Administered Medications (Endocrine & Metabolic):  .  insulin aspart (novoLOG) injection  0-5 Units .  insulin aspart (novoLOG) injection 0-9 Units .  levothyroxine (SYNTHROID) tablet 50 mcg   Current Facility-Administered Medications (Cardiovascular):  .  amiodarone (PACERONE) tablet 200 mg .  atorvastatin (LIPITOR) tablet 40 mg .  furosemide (LASIX) injection 40 mg .  milrinone (PRIMACOR) 20 MG/100 ML (0.2 mg/mL) infusion     Current Facility-Administered Medications (Analgesics):  .  acetaminophen (TYLENOL) tablet 650 mg .  HYDROcodone-acetaminophen (NORCO/VICODIN) 5-325 MG per tablet 1-2 tablet .  morphine 4 MG/ML injection 4 mg   Current Facility-Administered Medications (Hematological):  .  albumin human 25 % solution 12.5 g   Current Facility-Administered Medications (Other):  .  0.9 %  sodium chloride infusion (Manually program via Guardrails IV Fluids) .  0.9 %  sodium chloride infusion (Manually program via Guardrails IV Fluids) .  0.9 %  sodium chloride infusion .  cefTRIAXone (ROCEPHIN) 1 g in sodium chloride 0.9 % 100 mL IVPB .  Chlorhexidine Gluconate Cloth 2 % PADS 6 each .  ondansetron (ZOFRAN) injection 4 mg .  pantoprazole (PROTONIX) 80 mg in sodium chloride 0.9 % 250 mL (0.32 mg/mL) infusion .  [START ON 01/20/2019] pantoprazole (PROTONIX) injection 40 mg .  polyethylene glycol (MIRALAX / GLYCOLAX) packet 17 g .  sodium chloride flush (NS) 0.9 % injection 3 mL .  sodium chloride flush (NS) 0.9 % injection 3 mL  No current outpatient medications on file.    Assessment/ Plan:  68 y.o. female with  medical problems of chronic systolic congestive heart failure, coronary disease, atrial fibrillation requiring anticoagulation, diabetes, anemia, carotid artery disease with history of left carotid endarterectomy, previous stroke, hypertension, chronic kidney disease, was admitted on 01/17/2019 with shortness of breath worsening edema, acute kidney injury.   1.  Acute kidney injury 2.  Chronic kidney disease stage III 3.  Shortness of breath 4.   Generalized edema 5.  Severe anemia 6.  Diabetes, insulin-dependent.  Hemoglobin A1c 7.4% December 14, 2018 7.  Hematuria 8.  Proteinuria 9.  Severe chronic systolic congestive heart failure LVEF 25 to 30% 10.  Pulmonary hypertension 11.  Atrial fibrillation requiring anticoagulation 12.  Hyponatremia 13. UTI - E Coli 01/17/2019  Abdominal imaging so far shows diffuse lumbar spine degeneration, fat stranding around pancreas from generalized volume overload, ascites, cardiomegaly and atherosclerosis Chest x-ray consistent with pulmonary vascular congestion 2D echo from September 5 shows severely reduced LVEF 20 to 30% mildly dilated left ventricular cavity, diffuse hypokinesis, moderately enlarged right ventricular cavity with elevated pulmonary pressures of 56 mm, severely dilated left atrium, moderately dilated right atrium, moderate tricuspid regurgitation  Urine culture=E Coli  Acute kidney injury is likely secondary to cardiorenal syndrome.  Patient has relative hypotension which may be leading to renal hypoperfusion.  She has underlying chronic kidney disease secondary to diabetes and hypertension.  Patient is also noted to have severe chronic congestive heart failure and hypoalbuminemia.  Recommend: - Avoid hypotension, - UOP and Renal function is improving with inotropic therapy as directed by cardiology team - Supplement IV albumin 12.5 g daily - furosemide 40 mg iv q 12 hrs at present -  Serum creatinine continues to be critically elevated but trend is improving   - Monitor electrolytes and renal function daily - serologies results pending    LOS: [place] 10/11/20209:30 AM  [place], [place]  Note: This note was prepared with Dragon dictation. Any transcription errors are unintentional

## 2019-01-20 ENCOUNTER — Inpatient Hospital Stay: Payer: Medicare Other

## 2019-01-20 DIAGNOSIS — I5023 Acute on chronic systolic (congestive) heart failure: Secondary | ICD-10-CM | POA: Diagnosis not present

## 2019-01-20 LAB — CBC
HCT: 22.8 % — ABNORMAL LOW (ref 36.0–46.0)
Hemoglobin: 6.9 g/dL — ABNORMAL LOW (ref 12.0–15.0)
MCH: 25.6 pg — ABNORMAL LOW (ref 26.0–34.0)
MCHC: 30.3 g/dL (ref 30.0–36.0)
MCV: 84.4 fL (ref 80.0–100.0)
Platelets: 195 10*3/uL (ref 150–400)
RBC: 2.7 MIL/uL — ABNORMAL LOW (ref 3.87–5.11)
RDW: 18.8 % — ABNORMAL HIGH (ref 11.5–15.5)
WBC: 5.4 10*3/uL (ref 4.0–10.5)
nRBC: 0 % (ref 0.0–0.2)

## 2019-01-20 LAB — HEMOGLOBIN AND HEMATOCRIT, BLOOD
HCT: 23.8 % — ABNORMAL LOW (ref 36.0–46.0)
Hemoglobin: 7.5 g/dL — ABNORMAL LOW (ref 12.0–15.0)

## 2019-01-20 LAB — PROTEIN ELECTROPHORESIS, SERUM
A/G Ratio: 0.6 — ABNORMAL LOW (ref 0.7–1.7)
Albumin ELP: 2.5 g/dL — ABNORMAL LOW (ref 2.9–4.4)
Alpha-1-Globulin: 0.2 g/dL (ref 0.0–0.4)
Alpha-2-Globulin: 0.5 g/dL (ref 0.4–1.0)
Beta Globulin: 1.1 g/dL (ref 0.7–1.3)
Gamma Globulin: 2.5 g/dL — ABNORMAL HIGH (ref 0.4–1.8)
Globulin, Total: 4.4 g/dL — ABNORMAL HIGH (ref 2.2–3.9)
Total Protein ELP: 6.9 g/dL (ref 6.0–8.5)

## 2019-01-20 LAB — BASIC METABOLIC PANEL
Anion gap: 7 (ref 5–15)
BUN: 61 mg/dL — ABNORMAL HIGH (ref 8–23)
CO2: 26 mmol/L (ref 22–32)
Calcium: 7.8 mg/dL — ABNORMAL LOW (ref 8.9–10.3)
Chloride: 100 mmol/L (ref 98–111)
Creatinine, Ser: 2.86 mg/dL — ABNORMAL HIGH (ref 0.44–1.00)
GFR calc Af Amer: 19 mL/min — ABNORMAL LOW (ref >60)
GFR calc non Af Amer: 16 mL/min — ABNORMAL LOW (ref >60)
Glucose, Bld: 164 mg/dL — ABNORMAL HIGH (ref 70–99)
Potassium: 3.8 mmol/L (ref 3.5–5.1)
Sodium: 133 mmol/L — ABNORMAL LOW (ref 135–145)

## 2019-01-20 LAB — PROTEIN ELECTRO, RANDOM URINE
Albumin ELP, Urine: 41 %
Alpha-1-Globulin, U: 2.1 %
Alpha-2-Globulin, U: 5.6 %
Beta Globulin, U: 33.2 %
Gamma Globulin, U: 18 %
Total Protein, Urine: 14.4 mg/dL

## 2019-01-20 LAB — CORTISOL: Cortisol, Plasma: 13.4 ug/dL

## 2019-01-20 LAB — GLUCOSE, CAPILLARY
Glucose-Capillary: 159 mg/dL — ABNORMAL HIGH (ref 70–99)
Glucose-Capillary: 143 mg/dL — ABNORMAL HIGH (ref 70–99)
Glucose-Capillary: 266 mg/dL — ABNORMAL HIGH (ref 70–99)
Glucose-Capillary: 211 mg/dL — ABNORMAL HIGH (ref 70–99)
Glucose-Capillary: 223 mg/dL — ABNORMAL HIGH (ref 70–99)

## 2019-01-20 LAB — PREPARE RBC (CROSSMATCH)

## 2019-01-20 MED ORDER — FUROSEMIDE 10 MG/ML IJ SOLN
8.0000 mg/h | INTRAVENOUS | Status: DC
  Administered 2019-01-20 – 2019-01-23 (×3): 8 mg/h via INTRAVENOUS
  Filled 2019-01-20 (×4): qty 25

## 2019-01-20 MED ORDER — BISACODYL 10 MG RE SUPP
10.0000 mg | Freq: Once | RECTAL | Status: AC
  Administered 2019-01-20: 10 mg via RECTAL
  Filled 2019-01-20: qty 1

## 2019-01-20 MED ORDER — SODIUM CHLORIDE 0.9% IV SOLUTION
Freq: Once | INTRAVENOUS | Status: AC
  Administered 2019-01-20: 09:00:00 via INTRAVENOUS

## 2019-01-20 NOTE — Progress Notes (Signed)
PT Cancellation Note  Patient Details Name: Meghan [name] MRN: [id number] DOB: 10/19/1950   Cancelled Treatment:    Reason Eval/Treat Not Completed: Medical issues which prohibited therapy.  Chart reviewed.  Pt's Hgb noted to be decreased to 6.9 this morning with order in chart for RBC transfusion.  Per PT guidelines for low Hgb, will hold PT at this time and re-attempt PT treatment session at a later date/time as medically appropriate.  Leitha Bleak, PT 01/20/19, 8:29 AM [phone]

## 2019-01-20 NOTE — Plan of Care (Signed)
  Problem: Education: Goal: Knowledge of General Education information will improve Description: Including pain rating scale, medication(s)/side effects and non-pharmacologic comfort measures Outcome: Progressing   Problem: Health Behavior/Discharge Planning: Goal: Ability to manage health-related needs will improve Outcome: Progressing   Problem: Clinical Measurements: Goal: Ability to maintain clinical measurements within normal limits will improve Outcome: Progressing Goal: Will remain free from infection Outcome: Progressing Goal: Diagnostic test results will improve Outcome: Progressing Goal: Respiratory complications will improve Outcome: Progressing Goal: Cardiovascular complication will be avoided Outcome: Progressing   Problem: Activity: Goal: Risk for activity intolerance will decrease Outcome: Progressing   Problem: Elimination: Goal: Will not experience complications related to bowel motility Outcome: Progressing   Problem: Pain Managment: Goal: General experience of comfort will improve Outcome: Progressing   Problem: Safety: Goal: Ability to remain free from injury will improve Outcome: Progressing   Problem: Skin Integrity: Goal: Risk for impaired skin integrity will decrease Outcome: Progressing

## 2019-01-20 NOTE — Progress Notes (Signed)
CRITICAL CARE PROGRESS NOTE    Name: Meghan [name] MRN: [id number] DOB: April 19, 1950     LOS: 3   SUBJECTIVE FINDINGS & SIGNIFICANT EVENTS      BRIEF PATIENT DESCRIPTION:  68 year old female admitted with acute hypoxic respiratory failure secondary to acute on chronic systolic CHF, acute blood loss anemia secondary to GI bleed, AKI on CKD, and right sided flank/back pain.   SIGNIFICANT EVENTS  10/9>>Admission to Stepdown 10/9>> GI, Nephrology, Cardiology consulted, started Milrinone infusion   STUDIES:  10/9 - CT Abdomen & Pelvis:1. Mild fat stranding around the pancreas, likely from generalized volume overload but please correlate with serum enzymes. 2. Small volume ascites accumulated around the liver. 3. Cardiomegaly and atherosclerosis.  10/9 - Venous Ultrasound LLE>>negative   CULTURES: SARS-CoV-2 PCR 10/9>> negative  ANTIBIOTICS: Ceftriaxone 10/9>>   Overnight: Decrement in h/h - s/p 1 prbc transfusion today.     PAST MEDICAL HISTORY   Past Medical History:  Diagnosis Date  . Carotid arterial disease ([place])    a. 02/2012 s/p L CEA.  . CKD stage G3b/A1, GFR 30-44 and albumin creatinine ratio <30 mg/g   . Coronary artery disease    a. 02/2012 [facility] MV: Anterior, anteroseptal, septal, apical infarct with mild peri-infarct ischemia, EF 41%- medically managed due to anemia; b. 03/2017 Cath: LM nl, LAD 99ost/p/m - faint L->L collaterals, RI 20ost, LCX 43m, RCA 20p, 50m-->Med Rx for chronic subtotal LAD dzs. Avoid R Radial access in future.  . 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 Hyperlipidemia   . Hypertension   . Ischemic cardiomyopathy    a. 06/2013 Echo: EF 35-40%, glob HK, mildly dil LA, mild LVH, mild TR, mild to mod MR; b. 07/2016 Echo: EF 25-30%, sev  mid-apicalanteroseptal, ant, apical HK, mild MR, sev dil LA, mod dil RA, mod TR, PASP 56mmHg; c. 11/2016 Echo: EF 20-25%, antsept DK, Gr2 DD, mild MR, mod dil LA/RV/RA, mod red RV fxn, mod-sev TR, PASP 40-86mmHg.  . Morbid obesity ([place])   . Normocytic anemia   . Persistent atrial fibrillation (HCC)    a. CHA2DS2VASc = 7-->coumadin; maintaining sinus on amio.  . Stroke [facility]) 2013     SURGICAL HISTORY   Past Surgical History:  Procedure Laterality Date  . AMPUTATION TOE Left 09/22/2016   Procedure: AMPUTATION TOE;  Surgeon: Albertine Patricia, DPM;  Location: [facility] ORS;  Service: Podiatry;  Laterality: Left;  . CARDIOVERSION N/A 07/28/2016   Procedure: CARDIOVERSION;  Surgeon: Wellington Hampshire, MD;  Location: [facility] ORS;  Service: Cardiovascular;  Laterality: N/A;  . CARDIOVERSION N/A 08/07/2016   Procedure: CARDIOVERSION;  Surgeon: Wellington Hampshire, MD;  Location: [facility] ORS;  Service: Cardiovascular;  Laterality: N/A;  . CARDIOVERSION N/A 01/03/2019   Procedure: CARDIOVERSION;  Surgeon: Minna Merritts, MD;  Location: [facility] ORS;  Service: Cardiovascular;  Laterality: N/A;  . CAROTID ENDARTERECTOMY     [facility]; Dr. Lucky Cowboy  . CHOLECYSTECTOMY    . foot infection Right   . LEG SURGERY     right;infection  . RIGHT/LEFT HEART CATH AND CORONARY ANGIOGRAPHY Bilateral 03/12/2017   Procedure: RIGHT/LEFT HEART CATH AND CORONARY ANGIOGRAPHY;  Surgeon: Wellington Hampshire, MD;  Location: [facility];  Service: Cardiovascular;  Laterality: Bilateral;  . TONSILLECTOMY       FAMILY HISTORY   Family History  Problem Relation Age of Onset  . Heart disease Mother   . Diabetes Mother   . Heart disease Father   .  Hypertension Father   . Diabetes Maternal Grandmother      SOCIAL HISTORY   Social History   Tobacco Use  . Smoking status: Former Smoker    Packs/day: 0.50    Years: 5.00    Pack years: 2.50    Types: Cigarettes  . Smokeless tobacco: Never Used  Substance Use Topics  . Alcohol use:  No  . Drug use: No     MEDICATIONS   Current Medication:  Current Facility-Administered Medications:  .  0.9 %  sodium chloride infusion (Manually program via Guardrails IV Fluids), , Intravenous, Once, Bradly Bienenstock, NP, Stopped at 01/17/19 1010 .  0.9 %  sodium chloride infusion (Manually program via Guardrails IV Fluids), , Intravenous, Once, Blakeney, Dana G, NP .  0.9 %  sodium chloride infusion, 250 mL, Intravenous, PRN, Lang Snow, NP, Stopped at 01/17/19 1010 .  acetaminophen (TYLENOL) tablet 650 mg, 650 mg, Oral, Q4H PRN, Lang Snow, NP, 650 mg at 01/18/19 0318 .  albumin human 25 % solution 12.5 g, 12.5 g, Intravenous, Daily, Murlean Iba, MD, Stopped at 01/19/19 1202 .  amiodarone (PACERONE) tablet 200 mg, 200 mg, Oral, BID, Ouma, Bing Neighbors, NP, 200 mg at 01/20/19 0905 .  atorvastatin (LIPITOR) tablet 40 mg, 40 mg, Oral, Daily, Ouma, Bing Neighbors, NP, 40 mg at 01/19/19 1706 .  cefTRIAXone (ROCEPHIN) 1 g in sodium chloride 0.9 % 100 mL IVPB, 1 g, Intravenous, Q24H, Darel Hong D, NP, Last Rate: 200 mL/hr at 01/20/19 0839, 1 g at 01/20/19 0839 .  Chlorhexidine Gluconate Cloth 2 % PADS 6 each, 6 each, Topical, Q0600, Kasa, Kurian, MD, 6 each at 01/19/19 1000 .  furosemide (LASIX) injection 40 mg, 40 mg, Intravenous, Q8H, Deboraha Sprang, MD, 40 mg at 01/20/19 0333 .  HYDROcodone-acetaminophen (NORCO/VICODIN) 5-325 MG per tablet 1-2 tablet, 1-2 tablet, Oral, Q6H PRN, Flora Lipps, MD, 2 tablet at 01/20/19 0202 .  insulin aspart (novoLOG) injection 0-5 Units, 0-5 Units, Subcutaneous, QHS, Ouma, Bing Neighbors, NP, 2 Units at 01/19/19 2009 .  insulin aspart (novoLOG) injection 0-9 Units, 0-9 Units, Subcutaneous, TID WC, Ouma, Bing Neighbors, NP, 1 Units at 01/20/19 0827 .  levothyroxine (SYNTHROID) tablet 50 mcg, 50 mcg, Oral, QAC breakfast, Ouma, Bing Neighbors, NP, 50 mcg at 01/20/19 0827 .  milrinone (PRIMACOR) 20 MG/100 ML  (0.2 mg/mL) infusion, 0.125 mcg/kg/min, Intravenous, Continuous, Benita Gutter, RPH, Last Rate: 5.21 mL/hr at 01/20/19 0839, 0.125 mcg/kg/min at 01/20/19 0839 .  morphine 4 MG/ML injection 4 mg, 4 mg, Intravenous, Once, Hinda Kehr, MD .  ondansetron [facility]) injection 4 mg, 4 mg, Intravenous, Q6H PRN, Ouma, Bing Neighbors, NP .  pantoprazole (PROTONIX) injection 40 mg, 40 mg, Intravenous, Q12H, Darel Hong D, NP .  polyethylene glycol (MIRALAX / GLYCOLAX) packet 17 g, 17 g, Oral, Daily PRN, Vanga, Tally Due, MD .  sodium chloride flush (NS) 0.9 % injection 3 mL, 3 mL, Intravenous, Q12H, Ouma, Bing Neighbors, NP, 3 mL at 01/19/19 2006 .  sodium chloride flush (NS) 0.9 % injection 3 mL, 3 mL, Intravenous, PRN, Ouma, Bing Neighbors, NP    ALLERGIES   Patient has no known allergies.    REVIEW OF SYSTEMS    10 point ROS conducted is negative except for right flank pain and abdominal discomfort.  PHYSICAL EXAMINATION   Vital Signs: Temp:  [97.7 F (36.5 C)-98.5 F (36.9 C)] 97.7 F (36.5 C) (10/12 0900) Pulse Rate:  [87-115] 114 (10/12 0900) Resp:  [9-20]  13 (10/12 0900) BP: (85-110)/(53-91) 102/64 (10/12 0900) SpO2:  [92 %-100 %] 94 % (10/12 0900)  GENERAL: Mild distress due to acutely ill state, chronically ill-appearing HEAD: Normocephalic, atraumatic.  EYES: Pupils equal, round, reactive to light.  No scleral icterus.  MOUTH: Moist mucosal membrane. NECK: Supple. No thyromegaly. No nodules. No JVD.  PULMONARY: Crackles bilaterally with decreased breath sounds CARDIOVASCULAR: S1 and S2. Regular rate and rhythm. No murmurs, rubs, or gallops.  GASTROINTESTINAL: Soft, nontender, non-distended. No masses. Positive bowel sounds. No hepatosplenomegaly.  MUSCULOSKELETAL: 3+ lower extremity edema NEUROLOGIC: Mild distress due to acute illness SKIN:intact,warm,dry   PERTINENT DATA     Infusions: . sodium chloride Stopped (01/17/19 1010)  . albumin human  Stopped (01/19/19 1202)  . cefTRIAXone (ROCEPHIN)  IV 1 g (01/20/19 0839)  . milrinone 0.125 mcg/kg/min (01/20/19 0839)   Scheduled Medications: . sodium chloride   Intravenous Once  . sodium chloride   Intravenous Once  . amiodarone  200 mg Oral BID  . atorvastatin  40 mg Oral Daily  . Chlorhexidine Gluconate Cloth  6 each Topical Q0600  . furosemide  40 mg Intravenous Q8H  . insulin aspart  0-5 Units Subcutaneous QHS  . insulin aspart  0-9 Units Subcutaneous TID WC  . levothyroxine  50 mcg Oral QAC breakfast  .  morphine injection  4 mg Intravenous Once  . pantoprazole  40 mg Intravenous Q12H  . sodium chloride flush  3 mL Intravenous Q12H   PRN Medications: sodium chloride, acetaminophen, HYDROcodone-acetaminophen, ondansetron (ZOFRAN) IV, polyethylene glycol, sodium chloride flush Hemodynamic parameters:   Intake/Output: 10/11 0701 - 10/12 0700 In: 870.4 [I.V.:725; IV Piggyback:145.3] Out: 2800 [Urine:2800]  Ventilator  Settings:     LAB RESULTS:  Basic Metabolic Panel: Recent Labs  Lab 01/17/19 0011 01/18/19 0654 01/19/19 0526 01/20/19 0510  NA 133* 136 136 133*  K 5.0 4.6 4.0 3.8  CL 100 101 101 100  CO2 24 24 25 26   GLUCOSE 119* 70 158* 164*  BUN 74* 71* 72* 61*  CREATININE 3.82* 3.49* 3.25* 2.86*  CALCIUM 7.7* 7.8* 7.7* 7.8*  MG  --  2.3  --   --   PHOS  --  5.2*  --   --    Liver Function Tests: Recent Labs  Lab 01/17/19 0011 01/17/19 1305  AST 42* 42*  ALT 23 23  ALKPHOS 103 79  BILITOT 1.3* 1.2  PROT 7.7 7.1  ALBUMIN 2.5* 2.2*   Recent Labs  Lab 01/17/19 1305  LIPASE 15  AMYLASE 30   No results for input(s): AMMONIA in the last 168 hours. CBC: Recent Labs  Lab 01/17/19 0011  01/18/19 0654 01/18/19 1025 01/18/19 2339 01/19/19 0526 01/19/19 2053 01/20/19 0510  WBC 6.6  --  6.9  --  6.2 5.3 5.5 5.4  NEUTROABS 4.7  --   --   --   --   --   --   --   HGB 6.7*   < > 7.0* 6.7* 7.2* 7.0* 7.1* 6.9*  HCT 21.7*   < > 21.9* 21.5* 23.1*  22.5* 23.4* 22.8*  MCV 81.9  --  82.3  --  83.1 82.7 84.8 84.4  PLT 193  --  185  --  196 185 176 195   < > = values in this interval not displayed.   Cardiac Enzymes: No results for input(s): CKTOTAL, CKMB, CKMBINDEX, TROPONINI in the last 168 hours. BNP: Invalid input(s): POCBNP CBG: Recent Labs  Lab 01/19/19 1143 01/19/19  1711 01/19/19 1918 01/20/19 0358 01/20/19 0805  GLUCAP 226* 238* 221* 159* 143*     IMAGING RESULTS:  Imaging:    ASSESSMENT AND PLAN    -Multidisciplinary rounds held today  Acute Hypoxic Respiratory Failure   -Due to acute on chronic decompensated systolic CHF -continue BiPAP nocturnally -Chest x-ray in a.m. for interval changes   Acute on chronic decompensated systolic CHF with EF less than 25%    -Cardiology on case appreciate input-Dr. Fletcher Anon -Continue with Primacor gtt. as well as Lasix GTT and albumin daily -Strict I's and O's -1200 cc fluid restriction per 24 hours -Nocturnal BiPAP -follow up cardiac enzymes as indicated ICU monitoring   Circulatory shock    -Likely cardiogenic due to above    -Treat heart failure as above    -Stat random cortisol to evaluate adrenal component   Acute blood loss anemia  -Due to lower GI bleed with melanotic stools per rectum   -Patient again had a decrement in H&H and received 1 unit PRBC today-continue to monitor H&H closely -Protonix 40 mg IV twice daily -Status post GI evaluation-recommendation to refer back to GI post stabilization of decompensated CHF -continue Foley Catheter-assess need daily -Prophylactic Rocephin IV   Acute on chronic renal failure KDIGO IV   -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   -Nephrology on case-appreciate input   -Avoid hypotension and nephrotoxins   -Nonoliguric at this time-remains on Lasix GTT currently  Atrial fibrillation   -Chronic, cardiology on case-DC amiodarone appreciate input    Hypothyroidism  -Chronic Continue home levothyroxine   ID -continue IV abx as prescibed -follow up cultures  GI/Nutrition GI PROPHYLAXIS as indicated DIET-->TF's as tolerated Constipation protocol as indicated-Dulcolax suppository today due to constipation x3 days  ENDO - ICU hypoglycemic\Hyperglycemia protocol -check FSBS per protocol   ELECTROLYTES -follow labs as needed -replace as needed -pharmacy consultation   DVT/GI PRX ordered -SCDs  TRANSFUSIONS AS NEEDED MONITOR FSBS ASSESS the need for LABS as needed   Critical care provider statement:    Critical care time (minutes):  109   Critical care time was exclusive of:  Separately billable procedures and treating other patients   Critical care was necessary to treat or prevent imminent or life-threatening deterioration of the following conditions:   Acute on chronic decompensated systolic CHF, acute hypoxemic respiratory failure, acute blood loss anemia, multiple comorbid conditions   Critical care was time spent personally by me on the following activities:  Development of treatment plan with patient or surrogate, discussions with consultants, evaluation of patient's response to treatment, examination of patient, obtaining history from patient or surrogate, ordering and performing treatments and interventions, ordering and review of laboratory studies and re-evaluation of patient's condition.  I assumed direction of critical care for this patient from another provider in my specialty: no    This document was prepared using Dragon voice recognition software and may include unintentional dictation errors.    Ottie Glazier, M.D.  Division of [place]

## 2019-01-20 NOTE — Progress Notes (Signed)
Progress Note  Patient Name: Meghan [name] Date of Encounter: 01/20/2019  Primary Cardiologist: Fletcher Anon  Subjective   She reports improved SOB. No chest pain or palpitations. Changed to IV Lasix 40 mg q 8 hours on 10/11. Remains massively volume up. With milrinone gtt and IV Lasix renal function continues to improve. Documented UOP of 1.9 L for the past 24 hours with a net - 3.1 L for the admission. Documented weight of 138.9 kg from 10/11 with a baseline weight around 104 kg.   Inpatient Medications    Scheduled Meds: . sodium chloride   Intravenous Once  . sodium chloride   Intravenous Once  . amiodarone  200 mg Oral BID  . atorvastatin  40 mg Oral Daily  . Chlorhexidine Gluconate Cloth  6 each Topical Q0600  . furosemide  40 mg Intravenous Q8H  . insulin aspart  0-5 Units Subcutaneous QHS  . insulin aspart  0-9 Units Subcutaneous TID WC  . levothyroxine  50 mcg Oral QAC breakfast  .  morphine injection  4 mg Intravenous Once  . pantoprazole  40 mg Intravenous Q12H  . sodium chloride flush  3 mL Intravenous Q12H   Continuous Infusions: . sodium chloride Stopped (01/17/19 1010)  . albumin human 12.5 g (01/20/19 0921)  . cefTRIAXone (ROCEPHIN)  IV 1 g (01/20/19 0839)  . milrinone 0.125 mcg/kg/min (01/20/19 0839)   PRN Meds: sodium chloride, acetaminophen, HYDROcodone-acetaminophen, ondansetron (ZOFRAN) IV, polyethylene glycol, sodium chloride flush   Vital Signs    Vitals:   01/20/19 0700 01/20/19 0800 01/20/19 0839 01/20/19 0900  BP: (!) 110/91 107/64 99/87 102/64  Pulse: (!) 114 (!) 114 (!) 115 (!) 114  Resp: 15 12 15 13   Temp:   97.9 F (36.6 C) 97.7 F (36.5 C)  TempSrc:   Axillary Axillary  SpO2: 98% 96% 92% 94%  Weight:      Height:        Intake/Output Summary (Last 24 hours) at 01/20/2019 1011 Last data filed at 01/20/2019 0839 Gross per 24 hour  Intake 1001.33 ml  Output 2050 ml  Net -1048.67 ml   Filed Weights   01/17/19 0001 01/17/19 0439  01/19/19 0500  Weight: 120.2 kg (!) 139 kg (!) 138.9 kg    Telemetry    Afib with RVR, low 100s to 110s bpm, rare PVC - Personally Reviewed  ECG    No new tracings - Personally Reviewed  Physical Exam   GEN: No acute distress.   Neck: No JVD. Cardiac: Mildly tachycardic, irregularly irregular, no murmurs, rubs, or gallops.  Respiratory: Diminished breath sounds bilaterally with crackles along the bilateral bases on anterior exam.  GI: Obese, soft, nontender, distended with edema noted   MS: 2+ pitting lower extremity edema; No deformity. Neuro:  Alert and oriented x 3; Nonfocal.  Psych: Normal affect.  Labs    Chemistry Recent Labs  Lab 01/17/19 0011 01/17/19 1305 01/18/19 0654 01/19/19 0526 01/20/19 0510  NA 133*  --  136 136 133*  K 5.0  --  4.6 4.0 3.8  CL 100  --  101 101 100  CO2 24  --  24 25 26   GLUCOSE 119*  --  70 158* 164*  BUN 74*  --  71* 72* 61*  CREATININE 3.82*  --  3.49* 3.25* 2.86*  CALCIUM 7.7*  --  7.8* 7.7* 7.8*  PROT 7.7 7.1  --   --   --   ALBUMIN 2.5* 2.2*  --   --   --  AST 42* 42*  --   --   --   ALT 23 23  --   --   --   ALKPHOS 103 79  --   --   --   BILITOT 1.3* 1.2  --   --   --   GFRNONAA 11*  --  13* 14* 16*  GFRAA 13*  --  15* 16* 19*  ANIONGAP 9  --  11 10 7      Hematology Recent Labs  Lab 01/19/19 0526 01/19/19 2053 01/20/19 0510  WBC 5.3 5.5 5.4  RBC 2.72* 2.76* 2.70*  HGB 7.0* 7.1* 6.9*  HCT 22.5* 23.4* 22.8*  MCV 82.7 84.8 84.4  MCH 25.7* 25.7* 25.6*  MCHC 31.1 30.3 30.3  RDW 18.5* 19.0* 18.8*  PLT 185 176 195    Cardiac EnzymesNo results for input(s): TROPONINI in the last 168 hours. No results for input(s): TROPIPOC in the last 168 hours.   BNP Recent Labs  Lab 01/17/19 0011  BNP 790.0*     DDimer No results for input(s): DDIMER in the last 168 hours.   Radiology    No results found.  Cardiac Studies   2D echo 12/2018: 1. The left ventricle has severely reduced systolic function, with an  ejection fraction of 25-30%. The cavity size was mildly dilated. Left ventricular diastolic Doppler parameters are indeterminate. Left ventricular diffuse hypokinesis.  2. The right ventricle has mildly reduced systolic function. The cavity was moderately enlarged. There is no increase in right ventricular wall thickness. Right ventricular systolic pressure is moderately elevated with an estimated pressure of 56.2  mmHg.  3. Left atrial size was severely dilated.  4. Right atrial size was moderately dilated.  5. Tricuspid valve regurgitation is moderate.  6. Rhythm is atrial fibrillation  Patient Profile     68 y.o. female with history of CAD as detailed below, HFrEF secondary to ICM with prior hyperkalemia with spironolactone in the setting of renal failure, persistent A. fib s/p recent DCCV on 01/03/2019 that was briefly successful on amiodarone and Coumadin, CVA, anemia, carotid artery disease s/p left-sided CEA, DM, HTN, HLD who is being seen today for the evaluation of CHF.  Assessment & Plan    1. Acute on chronic HFrEF secondary to ICM with likely cardiorenal syndrome: -She remains massively volume overloaded -Change to IV Lasix gtt at 8 mg/hr for now (was receiving Lasix IV 40 mg q 8 hours as of 10/11) -Continue milrinone gtt at current dose  -She will require extensive inpatient IV diuresis   2. Persistent Afib: -Ventricular rates are reasonably well controlled -Anticoagulation on hold given bleed/anemia -Remains on amiodarone for now, will stop given she is no longer on anticoagulation in an effort to reduce risk of pharmacological conversion  3. GI bleed/acute blood loss anemia: -HGB remains low, though stable -Transfuse to goal HGB > 8.5  4. Acute on CKD stage III: -Continues to improve with milrinone and IV diuresis  -Monitor  5. Lower extremity edema: -Likely multifactorial including anemia, hypoalbuminemia   For questions or updates, please contact [place]  Please consult [email or web address] for contact info under Cardiology/STEMI.    Signed, Christell Faith, PA-C [facility] Pager: [phone] 01/20/2019, 10:11 AM

## 2019-01-20 NOTE — Progress Notes (Signed)
[facility] [place] NAME: Meghan [name]    MR#:  [id number]  DATE OF BIRTH:  [birth year]  SUBJECTIVE:   Patient states she is feeling better today.  Her breathing has improved.  She has been urinating a lot with the Lasix.  She denies any chest pain or abdominal pain.  REVIEW OF SYSTEMS:    Review of Systems  Constitutional: Positive for malaise/fatigue. Negative for chills and fever.  HENT: Negative for sore throat.   Eyes: Negative for blurred vision, double vision and pain.  Respiratory: Positive for shortness of breath. Negative for cough, hemoptysis and wheezing.   Cardiovascular: Positive for leg swelling. Negative for chest pain, palpitations and orthopnea.  Gastrointestinal: Negative for abdominal pain, constipation, diarrhea, heartburn, nausea and vomiting.  Genitourinary: Negative for dysuria and hematuria.  Musculoskeletal: Negative for back pain and joint pain.  Skin: Negative for rash.  Neurological: Negative for sensory change, speech change, focal weakness and headaches.  Endo/Heme/Allergies: Does not bruise/bleed easily.  Psychiatric/Behavioral: Negative for depression. The patient is not nervous/anxious.     DRUG ALLERGIES:  No Known Allergies  VITALS:  Blood pressure 107/66, pulse (!) 109, temperature (!) 97.5 F (36.4 C), temperature source Oral, resp. rate 18, height 5\' 7"  (1.702 m), weight (!) 138.9 kg, SpO2 95 %.  PHYSICAL EXAMINATION:   Physical Exam  GENERAL:  68 y.o.-year-old patient lying in the bed with no acute distress.  EYES: Pupils equal, round, reactive to light and accommodation. No scleral icterus. Extraocular muscles intact.  HEENT: Head atraumatic, normocephalic. Oropharynx and nasopharynx clear.  NECK:  Supple, no jugular venous distention. No thyroid enlargement, no tenderness.  LUNGS: Normal breath sounds bilaterally, no wheezing, rales, rhonchi. No use of accessory muscles of respiration.   CARDIOVASCULAR: Tachycardic, Irregularly irregular rhythm, S1, S2 normal. No murmurs, rubs, or gallops.  ABDOMEN: Soft, nontender, nondistended. Bowel sounds present. No organomegaly or mass.  EXTREMITIES: No cyanosis, clubbing. 3+ bilateral pitting edema. NEUROLOGIC: Cranial nerves II through XII are intact. No focal Motor or sensory deficits b/l.   PSYCHIATRIC: The patient is alert and awake SKIN: No obvious rash, lesion, or ulcer.   LABORATORY PANEL:   CBC Recent Labs  Lab 01/20/19 0510 01/20/19 1313  WBC 5.4  --   HGB 6.9* 7.5*  HCT 22.8* 23.8*  PLT 195  --    ------------------------------------------------------------------------------------------------------------------ Chemistries  Recent Labs  Lab 01/17/19 1305 01/18/19 0654  01/20/19 0510  NA  --  136   < > 133*  K  --  4.6   < > 3.8  CL  --  101   < > 100  CO2  --  24   < > 26  GLUCOSE  --  70   < > 164*  BUN  --  71*   < > 61*  CREATININE  --  3.49*   < > 2.86*  CALCIUM  --  7.8*   < > 7.8*  MG  --  2.3  --   --   AST 42*  --   --   --   ALT 23  --   --   --   ALKPHOS 79  --   --   --   BILITOT 1.2  --   --   --    < > = values in this interval not displayed.   ------------------------------------------------------------------------------------------------------------------  Cardiac Enzymes No results for input(s): TROPONINI in the last 168 hours. ------------------------------------------------------------------------------------------------------------------  RADIOLOGY:  No results found.   ASSESSMENT AND PLAN:   Acute on chronic systolic congestive heart failure-patient continues to be markedly volume overloaded -Last Echo 12/2018 with severely reduced LV EF 20-35% -Cardiology following and will place patient on Lasix drip today -Continue milrinone at current dose -Restart low-dose Coreg today -Holding losartan due to hypotension  GI bleed- hemoglobin is improving s/p blood  transfusions -s/p vitamin K to reverse Coumadin -GI following-will plan for endoscopy either closer to discharge once patient is more medically stable or as an outpatient -Continue IV Protonix-can consider switching to p.o.  Acute blood loss anemia- due to above -Serial H/H -Transfusion threshold > 9  AKI on CKD III- likely due to cardiorenal syndrome.  Creatinine is improving. -Switched to Lasix gtt today  Persistent atrial fibrillation - s/p cardioversion still in A. fib -Amiodarone discontinued per cardiology -Holding anticoagulation in the setting of GI bleed  Hypertension- BPs are low -Low-dose Coreg restarted today -Holding home losartan  Type 2 diabetes-recent A1c 7.2 -Continue SSI  Hypothyroidism-stable -Continue home Synthroid  History of CVA -Holding Coumadin  All the records are reviewed and case discussed with Care Management/Social Worker Management plans discussed with the patient, family and they are in agreement.  CODE STATUS: FULL CODE  TOTAL TIME TAKING CARE OF THIS PATIENT: 40 minutes.   POSSIBLE D/C IN 4-5 DAYS, DEPENDING ON CLINICAL CONDITION.  Berna Spare Randale Carvalho M.D on 01/20/2019 at 2:52 PM  Between 7am to 6pm - Pager - [phone]  After 6pm go to [email or web address] - password [email or web address] [facility]  Office  [phone]  CC: Primary care physician; [name], MD  Note: This dictation was prepared with Dragon dictation along with smaller phrase technology. Any transcriptional errors that result from this process are unintentional.

## 2019-01-20 NOTE — Progress Notes (Signed)
[facility], [place] 01/20/19  Subjective:  Patient transition to furosemide drip. Still having some shortness of breath. Creatinine remains up at 2.9.   Objective:  Vital signs in last 24 hours:  Temp:  [97.5 F (36.4 C)-98.5 F (36.9 C)] 97.6 F (36.4 C) (10/12 1600) Pulse Rate:  [87-115] 107 (10/12 1600) Resp:  [10-24] 17 (10/12 1600) BP: (85-110)/(53-91) 110/67 (10/12 1600) SpO2:  [92 %-100 %] 92 % (10/12 1600)  Weight change:  Filed Weights   01/17/19 0001 01/17/19 0439 01/19/19 0500  Weight: 120.2 kg (!) 139 kg (!) 138.9 kg    Intake/Output:    Intake/Output Summary (Last 24 hours) at 01/20/2019 1651 Last data filed at 01/20/2019 1600 Gross per 24 hour  Intake 1815.9 ml  Output 3150 ml  Net -1334.1 ml    Physical Exam: General:  No acute distress, obese female, laying in the bed  HEENT  moist oral mucous membranes  Neck:  Supple  Lungs:  mild crackles at bases, [place] O2  Heart::  S1S2 tachycaridc  Abdomen:  Soft, nontender  Extremities:  2-3+ pitting edema  Neurologic:  Alert, able to answer questions  Skin:  Warm, dry  Foley in place    Basic Metabolic Panel:  Recent Labs  Lab 01/17/19 0011 01/18/19 0654 01/19/19 0526 01/20/19 0510  NA 133* 136 136 133*  K 5.0 4.6 4.0 3.8  CL 100 101 101 100  CO2 24 24 25 26   GLUCOSE 119* 70 158* 164*  BUN 74* 71* 72* 61*  CREATININE 3.82* 3.49* 3.25* 2.86*  CALCIUM 7.7* 7.8* 7.7* 7.8*  MG  --  2.3  --   --   PHOS  --  5.2*  --   --      CBC: Recent Labs  Lab 01/17/19 0011  01/18/19 0654  01/18/19 2339 01/19/19 0526 01/19/19 2053 01/20/19 0510 01/20/19 1313  WBC 6.6  --  6.9  --  6.2 5.3 5.5 5.4  --   NEUTROABS 4.7  --   --   --   --   --   --   --   --   HGB 6.7*   < > 7.0*   < > 7.2* 7.0* 7.1* 6.9* 7.5*  HCT 21.7*   < > 21.9*   < > 23.1* 22.5* 23.4* 22.8* 23.8*  MCV 81.9  --  82.3  --  83.1 82.7 84.8 84.4  --   PLT 193  --  185  --  196 185 176 195  --    < > =  values in this interval not displayed.     No results found for: HEPBSAG, HEPBSAB, HEPBIGM    Microbiology:  Recent Results (from the past 240 hour(s))  SARS Coronavirus 2 by RT PCR (hospital order, performed in [facility] hospital lab) Nasopharyngeal Nasopharyngeal Swab     Status: None   Collection Time: 01/17/19  1:41 AM   Specimen: Nasopharyngeal Swab  Result Value Ref Range Status   SARS Coronavirus 2 NEGATIVE NEGATIVE Final    Comment: (NOTE) If result is NEGATIVE SARS-CoV-2 target nucleic acids are NOT DETECTED. The SARS-CoV-2 RNA is generally detectable in upper and lower  respiratory specimens during the acute phase of infection. The lowest  concentration of SARS-CoV-2 viral copies this assay can detect is 250  copies / mL. A negative result does not preclude SARS-CoV-2 infection  and should not be used as the sole basis for treatment or other  patient  management decisions.  A negative result may occur with  improper specimen collection / handling, submission of specimen other  than nasopharyngeal swab, presence of viral mutation(s) within the  areas targeted by this assay, and inadequate number of viral copies  (<250 copies / mL). A negative result must be combined with clinical  observations, patient history, and epidemiological information. If result is POSITIVE SARS-CoV-2 target nucleic acids are DETECTED. The SARS-CoV-2 RNA is generally detectable in upper and lower  respiratory specimens dur ing the acute phase of infection.  Positive  results are indicative of active infection with SARS-CoV-2.  Clinical  correlation with patient history and other diagnostic information is  necessary to determine patient infection status.  Positive results do  not rule out bacterial infection or co-infection with other viruses. If result is PRESUMPTIVE POSTIVE SARS-CoV-2 nucleic acids MAY BE PRESENT.   A presumptive positive result was obtained on the submitted specimen  and  confirmed on repeat testing.  While 2019 novel coronavirus  (SARS-CoV-2) nucleic acids may be present in the submitted sample  additional confirmatory testing may be necessary for epidemiological  and / or clinical management purposes  to differentiate between  SARS-CoV-2 and other Sarbecovirus currently known to infect humans.  If clinically indicated additional testing with an alternate test  methodology [phone]) is advised. The SARS-CoV-2 RNA is generally  detectable in upper and lower respiratory sp ecimens during the acute  phase of infection. The expected result is Negative. Fact Sheet for Patients:  StrictlyIdeas.no Fact Sheet for Healthcare Providers: BankingDealers.co.za This test is not yet approved or cleared by the [place] FDA and has been authorized for detection and/or diagnosis of SARS-CoV-2 by FDA under an Emergency Use Authorization (EUA).  This EUA will remain in effect (meaning this test can be used) for the duration of the COVID-19 declaration under Section 564(b)(1) of the Act, 21 U.S.C. section 360bbb-3(b)(1), unless the authorization is terminated or revoked sooner. Performed at [facility], [address] [place], [place] [address]   MRSA PCR Screening     Status: None   Collection Time: 01/17/19  4:44 AM   Specimen: Nasal Mucosa; Nasopharyngeal  Result Value Ref Range Status   MRSA by PCR NEGATIVE NEGATIVE Final    Comment:        The GeneXpert MRSA Assay (FDA approved for NASAL specimens only), is one component of a comprehensive MRSA colonization surveillance program. It is not intended to diagnose MRSA infection nor to guide or monitor treatment for MRSA infections. Performed at [facility], [address], [place], [place] [address]   Urine Culture     Status: Abnormal   Collection Time: 01/17/19 11:52 AM   Specimen: Urine, Clean Catch  Result Value Ref Range Status    Specimen Description   Final    URINE, CLEAN CATCH Performed at [facility], [address] [place], [place] [address]    Special Requests   Final    Normal Performed at [facility], [place], [place] [address]    Culture >=100,000 COLONIES/mL ESCHERICHIA COLI (A)  Final   Report Status 01/19/2019 FINAL  Final   Organism ID, Bacteria ESCHERICHIA COLI (A)  Final      Susceptibility   Escherichia coli - MIC*    AMPICILLIN >=32 RESISTANT Resistant     CEFAZOLIN <=4 SENSITIVE Sensitive     CEFTRIAXONE <=1 SENSITIVE Sensitive     CIPROFLOXACIN >=4 RESISTANT Resistant  GENTAMICIN <=1 SENSITIVE Sensitive     IMIPENEM <=0.25 SENSITIVE Sensitive     NITROFURANTOIN <=16 SENSITIVE Sensitive     TRIMETH/SULFA <=20 SENSITIVE Sensitive     AMPICILLIN/SULBACTAM 8 SENSITIVE Sensitive     PIP/TAZO <=4 SENSITIVE Sensitive     Extended ESBL NEGATIVE Sensitive     * >=100,000 COLONIES/mL ESCHERICHIA COLI    Coagulation Studies: No results for input(s): LABPROT, INR in the last 72 hours.  Urinalysis: No results for input(s): COLORURINE, LABSPEC, PHURINE, GLUCOSEU, HGBUR, BILIRUBINUR, KETONESUR, PROTEINUR, UROBILINOGEN, NITRITE, LEUKOCYTESUR in the last 72 hours.  Invalid input(s): APPERANCEUR    Imaging: Dg Chest Port 1 View  Result Date: 01/20/2019 CLINICAL DATA:  68 year old female with a history of pulmonary disease EXAM: PORTABLE CHEST 1 VIEW COMPARISON:  01/17/2019 12/13/2018, 07/26/2016 FINDINGS: Cardiomediastinal silhouette unchanged in size and contour. No pneumothorax.  No pleural effusion. Reticulonodular opacities of the lungs, similar pattern to the most recent plain film studies of 01/17/2019 and 12/13/2018. There has been progression from the prior plain film of 2018. No acute displaced fracture IMPRESSION: Similar appearance of reticulonodular lung opacities to the plain film dated 01/17/2019, 12/13/2018 though progressed from 07/26/2016.  this may reflect progression of chronic fibrosis/scarring, however, superimposed multifocal infection difficult to exclude. Electronically Signed   By: Corrie Mckusick D.O.   On: 01/20/2019 16:00     Medications:    Current Facility-Administered Medications (Endocrine & Metabolic):  .  insulin aspart (novoLOG) injection 0-5 Units .  insulin aspart (novoLOG) injection 0-9 Units .  levothyroxine (SYNTHROID) tablet 50 mcg   Current Facility-Administered Medications (Cardiovascular):  .  atorvastatin (LIPITOR) tablet 40 mg .  furosemide (LASIX) 250 mg in dextrose 5 % 250 mL (1 mg/mL) infusion .  milrinone (PRIMACOR) 20 MG/100 ML (0.2 mg/mL) infusion     Current Facility-Administered Medications (Analgesics):  .  acetaminophen (TYLENOL) tablet 650 mg .  HYDROcodone-acetaminophen (NORCO/VICODIN) 5-325 MG per tablet 1-2 tablet   Current Facility-Administered Medications (Hematological):  .  albumin human 25 % solution 12.5 g   Current Facility-Administered Medications (Other):  .  0.9 %  sodium chloride infusion .  cefTRIAXone (ROCEPHIN) 1 g in sodium chloride 0.9 % 100 mL IVPB .  Chlorhexidine Gluconate Cloth 2 % PADS 6 each .  ondansetron (ZOFRAN) injection 4 mg .  pantoprazole (PROTONIX) injection 40 mg .  polyethylene glycol (MIRALAX / GLYCOLAX) packet 17 g .  sodium chloride flush (NS) 0.9 % injection 3 mL .  sodium chloride flush (NS) 0.9 % injection 3 mL  No current outpatient medications on file.    Assessment/ Plan:  68 y.o. female with  medical problems of chronic systolic congestive heart failure, coronary disease, atrial fibrillation requiring anticoagulation, diabetes, anemia, carotid artery disease with history of left carotid endarterectomy, previous stroke, hypertension, chronic kidney disease, was admitted on 01/17/2019 with shortness of breath worsening edema, acute kidney injury.   1.  Acute kidney injury 2.  Chronic kidney disease stage III 3.  Shortness of  breath 4.  Generalized edema 5.  Severe anemia 6.  Diabetes, insulin-dependent.  Hemoglobin A1c 7.4% December 14, 2018 7.  Hematuria 8.  Proteinuria 9.  Severe chronic systolic congestive heart failure LVEF 25 to 30% 10.  Pulmonary hypertension 11.  Atrial fibrillation requiring anticoagulation 12.  Hyponatremia 13. UTI - E Coli 01/17/2019    Acute kidney injury is likely secondary to cardiorenal syndrome.  Patient has relative hypotension which may be leading to renal hypoperfusion.  She has  underlying chronic kidney disease secondary to diabetes and hypertension.  Patient is also noted to have severe chronic congestive heart failure and hypoalbuminemia.  Recommend: - Patient transitioned to lasix gtt, continue at 8mg /hr.  Monitor UOP closely.  Pt advised that she may end up requiring dialysis if urine outpt drops.  We will maintain the patient on albumin 12.5 g IV daily for now as well.  Consider increasing this tomorrow.  In addition continue to monitor serum sodium closely.  Further plan as patient progresses.   LOS: 3 Rodneshia Greenhouse 10/12/20204:51 PM  [place], [place]  Note: This note was prepared with Dragon dictation. Any transcription errors are unintentional

## 2019-01-20 NOTE — Progress Notes (Addendum)
Shift summary:  - 1 unit PRBC transfused this AM, infusion began at 0848 hrs, off at 1130.  - Lasix infusion began this AM. Remains on Lasix, Milrinone gtts.

## 2019-01-20 NOTE — Progress Notes (Signed)
OT Cancellation Note  Patient Details Name: Meghan [name] MRN: [id number] DOB: 1950/05/26   Cancelled Treatment:    Reason Eval/Treat Not Completed: Medical issues which prohibited therapy. Chart reviewed.  Pt's Hgb noted to be decreased to 6.9 this morning and currently receiving RBC transfusion.  Per OT guidelines for low Hgb, will hold OT at this time and re-attempt OT treatment session at a later date/time as medically appropriate.  Jeni Salles, MPH, MS, OTR/L ascom [phone] 01/20/19, 9:43 AM

## 2019-01-21 DIAGNOSIS — I5023 Acute on chronic systolic (congestive) heart failure: Secondary | ICD-10-CM | POA: Diagnosis not present

## 2019-01-21 DIAGNOSIS — I4819 Other persistent atrial fibrillation: Secondary | ICD-10-CM | POA: Diagnosis not present

## 2019-01-21 LAB — GLUCOSE, CAPILLARY
Glucose-Capillary: 152 mg/dL — ABNORMAL HIGH (ref 70–99)
Glucose-Capillary: 160 mg/dL — ABNORMAL HIGH (ref 70–99)
Glucose-Capillary: 194 mg/dL — ABNORMAL HIGH (ref 70–99)
Glucose-Capillary: 232 mg/dL — ABNORMAL HIGH (ref 70–99)
Glucose-Capillary: 239 mg/dL — ABNORMAL HIGH (ref 70–99)
Glucose-Capillary: 294 mg/dL — ABNORMAL HIGH (ref 70–99)
Glucose-Capillary: 329 mg/dL — ABNORMAL HIGH (ref 70–99)

## 2019-01-21 LAB — TYPE AND SCREEN
ABO/RH(D): O POS
Antibody Screen: NEGATIVE
Unit division: 0
Unit division: 0
Unit division: 0

## 2019-01-21 LAB — ANCA TITERS
Atypical P-ANCA titer: <1:20 {titer}
C-ANCA: <1:20 {titer}
P-ANCA: <1:20 {titer}

## 2019-01-21 LAB — MAGNESIUM: Magnesium: 2.2 mg/dL (ref 1.7–2.4)

## 2019-01-21 LAB — BASIC METABOLIC PANEL
Anion gap: 9 (ref 5–15)
BUN: 53 mg/dL — ABNORMAL HIGH (ref 8–23)
CO2: 28 mmol/L (ref 22–32)
Calcium: 7.8 mg/dL — ABNORMAL LOW (ref 8.9–10.3)
Chloride: 97 mmol/L — ABNORMAL LOW (ref 98–111)
Creatinine, Ser: 2.48 mg/dL — ABNORMAL HIGH (ref 0.44–1.00)
GFR calc Af Amer: 22 mL/min — ABNORMAL LOW (ref >60)
GFR calc non Af Amer: 19 mL/min — ABNORMAL LOW (ref >60)
Glucose, Bld: 166 mg/dL — ABNORMAL HIGH (ref 70–99)
Potassium: 3.5 mmol/L (ref 3.5–5.1)
Sodium: 134 mmol/L — ABNORMAL LOW (ref 135–145)

## 2019-01-21 LAB — CBC WITH DIFFERENTIAL/PLATELET
Abs Immature Granulocytes: 0.15 10*3/uL — ABNORMAL HIGH (ref 0.00–0.07)
Basophils Absolute: 0.1 10*3/uL (ref 0.0–0.1)
Basophils Relative: 2 %
Eosinophils Absolute: 0.5 10*3/uL (ref 0.0–0.5)
Eosinophils Relative: 10 %
HCT: 24.4 % — ABNORMAL LOW (ref 36.0–46.0)
Hemoglobin: 7.6 g/dL — ABNORMAL LOW (ref 12.0–15.0)
Immature Granulocytes: 3 %
Lymphocytes Relative: 8 %
Lymphs Abs: 0.4 10*3/uL — ABNORMAL LOW (ref 0.7–4.0)
MCH: 26.3 pg (ref 26.0–34.0)
MCHC: 31.1 g/dL (ref 30.0–36.0)
MCV: 84.4 fL (ref 80.0–100.0)
Monocytes Absolute: 0.8 10*3/uL (ref 0.1–1.0)
Monocytes Relative: 15 %
Neutro Abs: 3.3 10*3/uL (ref 1.7–7.7)
Neutrophils Relative %: 62 %
Platelets: 206 10*3/uL (ref 150–400)
RBC: 2.89 MIL/uL — ABNORMAL LOW (ref 3.87–5.11)
RDW: 19 % — ABNORMAL HIGH (ref 11.5–15.5)
WBC: 5.2 10*3/uL (ref 4.0–10.5)
nRBC: 0 % (ref 0.0–0.2)

## 2019-01-21 LAB — BPAM RBC
Blood Product Expiration Date: 202010092359
Blood Product Expiration Date: 202011082359
Blood Product Expiration Date: 202011152359
ISSUE DATE / TIME: 202010090545
ISSUE DATE / TIME: 202010091617
ISSUE DATE / TIME: 202010120842
Unit Type and Rh: 5100
Unit Type and Rh: 5100
Unit Type and Rh: 9500

## 2019-01-21 LAB — PHOSPHORUS: Phosphorus: 3.3 mg/dL (ref 2.5–4.6)

## 2019-01-21 MED ORDER — HYDROCORTISONE NA SUCCINATE PF 100 MG IJ SOLR
100.0000 mg | Freq: Two times a day (BID) | INTRAMUSCULAR | Status: DC
  Administered 2019-01-21 – 2019-01-23 (×5): 100 mg via INTRAVENOUS
  Filled 2019-01-21 (×5): qty 2

## 2019-01-21 MED ORDER — POTASSIUM CHLORIDE 20 MEQ PO PACK
40.0000 meq | PACK | Freq: Once | ORAL | Status: AC
  Administered 2019-01-21: 40 meq via ORAL
  Filled 2019-01-21: qty 2

## 2019-01-21 NOTE — Progress Notes (Signed)
[facility], [place] 01/21/19  Subjective:  Patient doing well on furosemide drip. Creatinine currently 2.48. Urine output was 4.2 L over the preceding 24 hours.   Objective:  Vital signs in last 24 hours:  Temp:  [97.5 F (36.4 C)-98 F (36.7 C)] 98 F (36.7 C) (10/13 0400) Pulse Rate:  [105-118] 115 (10/13 1000) Resp:  [12-24] 15 (10/13 1000) BP: (88-110)/(54-80) 88/54 (10/13 1000) SpO2:  [89 %-96 %] 93 % (10/13 1000) Weight:  [139.5 kg] 139.5 kg (10/13 0400)  Weight change:  Filed Weights   01/17/19 0439 01/19/19 0500 01/21/19 0400  Weight: (!) 139 kg (!) 138.9 kg (!) 139.5 kg    Intake/Output:    Intake/Output Summary (Last 24 hours) at 01/21/2019 1149 Last data filed at 01/21/2019 1003 Gross per 24 hour  Intake 1171.65 ml  Output 4855 ml  Net -3683.35 ml    Physical Exam: General:  No acute distress, obese female, laying in the bed  HEENT  moist oral mucous membranes  Neck:  Supple  Lungs:  mild crackles at bases, [place] O2  Heart::  S1S2 no rubs  Abdomen:  Soft, nontender  Extremities:  2-3+ pitting edema  Neurologic:  Alert, able to answer questions  Skin:  Warm, dry  Foley in place    Basic Metabolic Panel:  Recent Labs  Lab 01/17/19 0011 01/18/19 0654 01/19/19 0526 01/20/19 0510 01/21/19 0457  NA 133* 136 136 133* 134*  K 5.0 4.6 4.0 3.8 3.5  CL 100 101 101 100 97*  CO2 24 24 25 26 28   GLUCOSE 119* 70 158* 164* 166*  BUN 74* 71* 72* 61* 53*  CREATININE 3.82* 3.49* 3.25* 2.86* 2.48*  CALCIUM 7.7* 7.8* 7.7* 7.8* 7.8*  MG  --  2.3  --   --  2.2  PHOS  --  5.2*  --   --  3.3     CBC: Recent Labs  Lab 01/17/19 0011  01/18/19 2339 01/19/19 0526 01/19/19 2053 01/20/19 0510 01/20/19 1313 01/21/19 0457  WBC 6.6   < > 6.2 5.3 5.5 5.4  --  5.2  NEUTROABS 4.7  --   --   --   --   --   --  3.3  HGB 6.7*   < > 7.2* 7.0* 7.1* 6.9* 7.5* 7.6*  HCT 21.7*   < > 23.1* 22.5* 23.4* 22.8* 23.8* 24.4*  MCV 81.9   < >  83.1 82.7 84.8 84.4  --  84.4  PLT 193   < > 196 185 176 195  --  206   < > = values in this interval not displayed.     No results found for: HEPBSAG, HEPBSAB, HEPBIGM    Microbiology:  Recent Results (from the past 240 hour(s))  SARS Coronavirus 2 by RT PCR (hospital order, performed in [facility] hospital lab) Nasopharyngeal Nasopharyngeal Swab     Status: None   Collection Time: 01/17/19  1:41 AM   Specimen: Nasopharyngeal Swab  Result Value Ref Range Status   SARS Coronavirus 2 NEGATIVE NEGATIVE Final    Comment: (NOTE) If result is NEGATIVE SARS-CoV-2 target nucleic acids are NOT DETECTED. The SARS-CoV-2 RNA is generally detectable in upper and lower  respiratory specimens during the acute phase of infection. The lowest  concentration of SARS-CoV-2 viral copies this assay can detect is 250  copies / mL. A negative result does not preclude SARS-CoV-2 infection  and should not be used as the sole basis for  treatment or other  patient management decisions.  A negative result may occur with  improper specimen collection / handling, submission of specimen other  than nasopharyngeal swab, presence of viral mutation(s) within the  areas targeted by this assay, and inadequate number of viral copies  (<250 copies / mL). A negative result must be combined with clinical  observations, patient history, and epidemiological information. If result is POSITIVE SARS-CoV-2 target nucleic acids are DETECTED. The SARS-CoV-2 RNA is generally detectable in upper and lower  respiratory specimens dur ing the acute phase of infection.  Positive  results are indicative of active infection with SARS-CoV-2.  Clinical  correlation with patient history and other diagnostic information is  necessary to determine patient infection status.  Positive results do  not rule out bacterial infection or co-infection with other viruses. If result is PRESUMPTIVE POSTIVE SARS-CoV-2 nucleic acids MAY BE PRESENT.    A presumptive positive result was obtained on the submitted specimen  and confirmed on repeat testing.  While 2019 novel coronavirus  (SARS-CoV-2) nucleic acids may be present in the submitted sample  additional confirmatory testing may be necessary for epidemiological  and / or clinical management purposes  to differentiate between  SARS-CoV-2 and other Sarbecovirus currently known to infect humans.  If clinically indicated additional testing with an alternate test  methodology [phone]) is advised. The SARS-CoV-2 RNA is generally  detectable in upper and lower respiratory sp ecimens during the acute  phase of infection. The expected result is Negative. Fact Sheet for Patients:  StrictlyIdeas.no Fact Sheet for Healthcare Providers: BankingDealers.co.za This test is not yet approved or cleared by the [place] FDA and has been authorized for detection and/or diagnosis of SARS-CoV-2 by FDA under an Emergency Use Authorization (EUA).  This EUA will remain in effect (meaning this test can be used) for the duration of the COVID-19 declaration under Section 564(b)(1) of the Act, 21 U.S.C. section 360bbb-3(b)(1), unless the authorization is terminated or revoked sooner. Performed at [facility], [address] [place], [place] [address]   MRSA PCR Screening     Status: None   Collection Time: 01/17/19  4:44 AM   Specimen: Nasal Mucosa; Nasopharyngeal  Result Value Ref Range Status   MRSA by PCR NEGATIVE NEGATIVE Final    Comment:        The GeneXpert MRSA Assay (FDA approved for NASAL specimens only), is one component of a comprehensive MRSA colonization surveillance program. It is not intended to diagnose MRSA infection nor to guide or monitor treatment for MRSA infections. Performed at [facility], [address], [place], [place] [address]   Urine Culture     Status: Abnormal   Collection Time:  01/17/19 11:52 AM   Specimen: Urine, Clean Catch  Result Value Ref Range Status   Specimen Description   Final    URINE, CLEAN CATCH Performed at [facility], [address] [place], [place] [address]    Special Requests   Final    Normal Performed at [facility], [place], [place] [address]    Culture >=100,000 COLONIES/mL ESCHERICHIA COLI (A)  Final   Report Status 01/19/2019 FINAL  Final   Organism ID, Bacteria ESCHERICHIA COLI (A)  Final      Susceptibility   Escherichia coli - MIC*    AMPICILLIN >=32 RESISTANT Resistant     CEFAZOLIN <=4 SENSITIVE Sensitive     CEFTRIAXONE <=1 SENSITIVE Sensitive     CIPROFLOXACIN >=4 RESISTANT  Resistant     GENTAMICIN <=1 SENSITIVE Sensitive     IMIPENEM <=0.25 SENSITIVE Sensitive     NITROFURANTOIN <=16 SENSITIVE Sensitive     TRIMETH/SULFA <=20 SENSITIVE Sensitive     AMPICILLIN/SULBACTAM 8 SENSITIVE Sensitive     PIP/TAZO <=4 SENSITIVE Sensitive     Extended ESBL NEGATIVE Sensitive     * >=100,000 COLONIES/mL ESCHERICHIA COLI    Coagulation Studies: No results for input(s): LABPROT, INR in the last 72 hours.  Urinalysis: No results for input(s): COLORURINE, LABSPEC, PHURINE, GLUCOSEU, HGBUR, BILIRUBINUR, KETONESUR, PROTEINUR, UROBILINOGEN, NITRITE, LEUKOCYTESUR in the last 72 hours.  Invalid input(s): APPERANCEUR    Imaging: Dg Chest Port 1 View  Result Date: 01/20/2019 CLINICAL DATA:  68 year old female with a history of pulmonary disease EXAM: PORTABLE CHEST 1 VIEW COMPARISON:  01/17/2019 12/13/2018, 07/26/2016 FINDINGS: Cardiomediastinal silhouette unchanged in size and contour. No pneumothorax.  No pleural effusion. Reticulonodular opacities of the lungs, similar pattern to the most recent plain film studies of 01/17/2019 and 12/13/2018. There has been progression from the prior plain film of 2018. No acute displaced fracture IMPRESSION: Similar appearance of reticulonodular lung opacities  to the plain film dated 01/17/2019, 12/13/2018 though progressed from 07/26/2016. this may reflect progression of chronic fibrosis/scarring, however, superimposed multifocal infection difficult to exclude. Electronically Signed   By: Corrie Mckusick D.O.   On: 01/20/2019 16:00     Medications:    Current Facility-Administered Medications (Endocrine & Metabolic):  .  hydrocortisone sodium succinate (SOLU-CORTEF) 100 MG injection 100 mg .  insulin aspart (novoLOG) injection 0-5 Units .  insulin aspart (novoLOG) injection 0-9 Units .  levothyroxine (SYNTHROID) tablet 50 mcg   Current Facility-Administered Medications (Cardiovascular):  .  atorvastatin (LIPITOR) tablet 40 mg .  furosemide (LASIX) 250 mg in dextrose 5 % 250 mL (1 mg/mL) infusion .  milrinone (PRIMACOR) 20 MG/100 ML (0.2 mg/mL) infusion     Current Facility-Administered Medications (Analgesics):  .  acetaminophen (TYLENOL) tablet 650 mg .  HYDROcodone-acetaminophen (NORCO/VICODIN) 5-325 MG per tablet 1-2 tablet   Current Facility-Administered Medications (Hematological):  .  albumin human 25 % solution 12.5 g   Current Facility-Administered Medications (Other):  .  0.9 %  sodium chloride infusion .  Chlorhexidine Gluconate Cloth 2 % PADS 6 each .  ondansetron (ZOFRAN) injection 4 mg .  pantoprazole (PROTONIX) injection 40 mg .  polyethylene glycol (MIRALAX / GLYCOLAX) packet 17 g .  potassium chloride (KLOR-CON) packet 40 mEq .  sodium chloride flush (NS) 0.9 % injection 3 mL .  sodium chloride flush (NS) 0.9 % injection 3 mL  No current outpatient medications on file.    Assessment/ Plan:  68 y.o. female with  medical problems of chronic systolic congestive heart failure, coronary disease, atrial fibrillation requiring anticoagulation, diabetes, anemia, carotid artery disease with history of left carotid endarterectomy, previous stroke, hypertension, chronic kidney disease, was admitted on 01/17/2019 with  shortness of breath worsening edema, acute kidney injury.   1.  Acute kidney injury 2.  Chronic kidney disease stage III 3.  Shortness of breath 4.  Generalized edema 5.  Severe anemia 6.  Diabetes, insulin-dependent.  Hemoglobin A1c 7.4% December 14, 2018 7.  Hematuria 8.  Proteinuria 9.  Severe chronic systolic congestive heart failure LVEF 25 to 30% 10.  Pulmonary hypertension 11.  Atrial fibrillation requiring anticoagulation 12.  Hyponatremia 13. UTI - E Coli 01/17/2019    Acute kidney injury is likely secondary to cardiorenal syndrome.  Patient has relative hypotension  which may be leading to renal hypoperfusion.  She has underlying chronic kidney disease secondary to diabetes and hypertension.  Patient is also noted to have severe chronic congestive heart failure and hypoalbuminemia.  Recommend: -Patient with good urine output over the preceding 24 hours at 4.2 L.  Therefore recommend continuing Lasix drip at this time along with close monitoring of renal parameters.  She does appear to be breathing comfortably.  Continue to monitor hemoglobin closely as most recent hemoglobin was 7.6.  Otherwise continue additional supportive care and avoid nephrotoxins as possible.  LOS: 4 Sorren Vallier 10/13/202011:49 AM  [place], [place]  Note: This note was prepared with Dragon dictation. Any transcription errors are unintentional

## 2019-01-21 NOTE — Progress Notes (Signed)
[place] [facility] [place] NAME: Meghan [name]    MR#:  [id number]  DATE OF BIRTH:  1951-04-02  SUBJECTIVE:   Patient states that her shortness of breath has greatly improved.  She denies any chest pain, palpitations, cough, or abdominal pain.  No fevers or chills.  REVIEW OF SYSTEMS:    Review of Systems  Constitutional: Positive for malaise/fatigue. Negative for chills and fever.  HENT: Negative.  Negative for sore throat.   Eyes: Negative for blurred vision, double vision and pain.  Respiratory: Positive for shortness of breath. Negative for cough, hemoptysis and wheezing.   Cardiovascular: Positive for leg swelling. Negative for chest pain, palpitations and orthopnea.  Gastrointestinal: Negative for abdominal pain, constipation, diarrhea, heartburn, nausea and vomiting.  Genitourinary: Negative for dysuria and hematuria.  Musculoskeletal: Negative for back pain and joint pain.  Skin: Negative for rash.  Neurological: Negative for sensory change, speech change, focal weakness and headaches.  Endo/Heme/Allergies: Does not bruise/bleed easily.  Psychiatric/Behavioral: Negative for depression. The patient is not nervous/anxious.     DRUG ALLERGIES:  No Known Allergies  VITALS:  Blood pressure 113/72, pulse (!) 106, temperature 98 F (36.7 C), resp. rate 18, height 5\' 7"  (1.702 m), weight (!) 139.5 kg, SpO2 96 %.  PHYSICAL EXAMINATION:   Physical Exam  GENERAL:  68 y.o.-year-old patient lying in the bed with no acute distress.  EYES: Pupils equal, round, reactive to light and accommodation. No scleral icterus. Extraocular muscles intact.  HEENT: Head atraumatic, normocephalic. Oropharynx and nasopharynx clear.  NECK:  Supple, no jugular venous distention. No thyroid enlargement, no tenderness.  LUNGS: Normal breath sounds bilaterally, no wheezing, rales, rhonchi. No use of accessory muscles of respiration.  CARDIOVASCULAR: Tachycardic,  Irregularly irregular rhythm, S1, S2 normal. No murmurs, rubs, or gallops.  ABDOMEN: Soft, nontender, nondistended. Bowel sounds present. No organomegaly or mass.  EXTREMITIES: No cyanosis, clubbing. 2-3+ bilateral pitting edema. NEUROLOGIC: Cranial nerves II through XII are intact. No focal Motor or sensory deficits b/l.   PSYCHIATRIC: The patient is alert and awake SKIN: No obvious rash, lesion, or ulcer.   LABORATORY PANEL:   CBC Recent Labs  Lab 01/21/19 0457  WBC 5.2  HGB 7.6*  HCT 24.4*  PLT 206   ------------------------------------------------------------------------------------------------------------------ Chemistries  Recent Labs  Lab 01/17/19 1305  01/21/19 0457  NA  --    < > 134*  K  --    < > 3.5  CL  --    < > 97*  CO2  --    < > 28  GLUCOSE  --    < > 166*  BUN  --    < > 53*  CREATININE  --    < > 2.48*  CALCIUM  --    < > 7.8*  MG  --    < > 2.2  AST 42*  --   --   ALT 23  --   --   ALKPHOS 79  --   --   BILITOT 1.2  --   --    < > = values in this interval not displayed.   ------------------------------------------------------------------------------------------------------------------  Cardiac Enzymes No results for input(s): TROPONINI in the last 168 hours. ------------------------------------------------------------------------------------------------------------------  RADIOLOGY:  Dg Chest Port 1 View  Result Date: 01/20/2019 CLINICAL DATA:  68 year old female with a history of pulmonary disease EXAM: PORTABLE CHEST 1 VIEW COMPARISON:  01/17/2019 12/13/2018, 07/26/2016 FINDINGS: Cardiomediastinal silhouette unchanged in size and contour. No  pneumothorax.  No pleural effusion. Reticulonodular opacities of the lungs, similar pattern to the most recent plain film studies of 01/17/2019 and 12/13/2018. There has been progression from the prior plain film of 2018. No acute displaced fracture IMPRESSION: Similar appearance of reticulonodular lung  opacities to the plain film dated 01/17/2019, 12/13/2018 though progressed from 07/26/2016. this may reflect progression of chronic fibrosis/scarring, however, superimposed multifocal infection difficult to exclude. Electronically Signed   By: Corrie Mckusick D.O.   On: 01/20/2019 16:00     ASSESSMENT AND PLAN:   Acute on chronic systolic congestive heart failure-patient continues to be markedly volume overloaded. UOP 1.2L in the last 24 hours. -Last ECHO 12/2018 with severely reduced LV EF 20-35% -Continue Lasix gtt + albumin IV and milrinone gtt -Holding losartan and coreg due to hypotension -Cardiology following  Cardiogenic shock- due to above -Solucortef 100mg  bid -Holding BP meds  GI bleed- patient has been having melena -s/p vitamin K to reverse Coumadin -GI following-will plan for endoscopy either closer to discharge once patient is more medically stable or as an outpatient -Continue IV Protonix bid  Acute blood loss anemia- due to above -Ordered for another unit PRBC today -Serial H/H -Transfusion threshold > 9  AKI on CKD III- likely due to cardiorenal syndrome.  Creatinine is improving. -Nephrology following  Persistent atrial fibrillation - s/p cardioversion still in A. fib -Amiodarone discontinued per cardiology -Holding anticoagulation in the setting of GI bleed  Type 2 diabetes-recent A1c 7.2 -Continue SSI  Hypothyroidism-stable -Continue home Synthroid  History of CVA -Holding Coumadin  All the records are reviewed and case discussed with Care Management/Social Worker Management plans discussed with the patient, family and they are in agreement.  CODE STATUS: FULL CODE  TOTAL TIME TAKING CARE OF THIS PATIENT: 40 minutes.   POSSIBLE D/C unknown, DEPENDING ON CLINICAL CONDITION.  Berna Spare  M.D on 01/21/2019 at 3:44 PM  Between 7am to 6pm - Pager - [phone]  After 6pm go to [email or web address] - password [email or web address] [facility]   Office  [phone]  CC: Primary care physician; [name], MD  Note: This dictation was prepared with Dragon dictation along with smaller phrase technology. Any transcriptional errors that result from this process are unintentional.

## 2019-01-21 NOTE — Progress Notes (Signed)
CRITICAL CARE PROGRESS NOTE    Name: Meghan [name] MRN: [id number] DOB: [birth year]     LOS: 4   SUBJECTIVE FINDINGS & SIGNIFICANT EVENTS      BRIEF PATIENT DESCRIPTION:  68 year old female admitted with acute hypoxic respiratory failure secondary to acute on chronic systolic CHF, acute blood loss anemia secondary to GI bleed, AKI on CKD, and right sided flank/back pain.   SIGNIFICANT EVENTS  10/9>>Admission to Stepdown 10/9>> GI, Nephrology, Cardiology consulted, started Milrinone infusion   STUDIES:  10/9 - CT Abdomen & Pelvis:1. Mild fat stranding around the pancreas, likely from generalized volume overload but please correlate with serum enzymes. 2. Small volume ascites accumulated around the liver. 3. Cardiomegaly and atherosclerosis.  10/9 - Venous Ultrasound LLE>>negative   CULTURES: SARS-CoV-2 PCR 10/9>> negative  ANTIBIOTICS: Ceftriaxone 10/9>>10/13 stopped rocephin   Overnight: Decrement in h/h - s/p 1 prbc transfusion today.     PAST MEDICAL HISTORY   Past Medical History:  Diagnosis Date  . Carotid arterial disease ([place])    a. 02/2012 s/p L CEA.  . CKD stage G3b/A1, GFR 30-44 and albumin creatinine ratio <30 mg/g   . Coronary artery disease    a. 02/2012 Lex[facility] MV: Anterior, anteroseptal, septal, apical infarct with mild peri-infarct ischemia, EF 41%- medically managed due to anemia; b. 03/2017 Cath: LM nl, LAD 99ost/p/m - faint L->L collaterals, RI 20ost, LCX 71m, RCA 20p, 31m-->Med Rx for chronic subtotal LAD dzs. Avoid R Radial access in future.  . 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 Hyperlipidemia   . Hypertension   . Ischemic cardiomyopathy    a. 06/2013 Echo: EF 35-40%, glob HK, mildly dil LA, mild LVH, mild TR, mild to mod MR; b. 07/2016 Echo: EF  25-30%, sev mid-apicalanteroseptal, ant, apical HK, mild MR, sev dil LA, mod dil RA, mod TR, PASP 68mmHg; c. 11/2016 Echo: EF 20-25%, antsept DK, Gr2 DD, mild MR, mod dil LA/RV/RA, mod red RV fxn, mod-sev TR, PASP 40-32mmHg.  . Morbid obesity ([place])   . Normocytic anemia   . Persistent atrial fibrillation (HCC)    a. CHA2DS2VASc = 7-->coumadin; maintaining sinus on amio.  . Stroke [facility]) 2013     SURGICAL HISTORY   Past Surgical History:  Procedure Laterality Date  . AMPUTATION TOE Left 09/22/2016   Procedure: AMPUTATION TOE;  Surgeon: Albertine Patricia, DPM;  Location: [facility] ORS;  Service: Podiatry;  Laterality: Left;  . CARDIOVERSION N/A 07/28/2016   Procedure: CARDIOVERSION;  Surgeon: Wellington Hampshire, MD;  Location: [facility] ORS;  Service: Cardiovascular;  Laterality: N/A;  . CARDIOVERSION N/A 08/07/2016   Procedure: CARDIOVERSION;  Surgeon: Wellington Hampshire, MD;  Location: [facility] ORS;  Service: Cardiovascular;  Laterality: N/A;  . CARDIOVERSION N/A 01/03/2019   Procedure: CARDIOVERSION;  Surgeon: Minna Merritts, MD;  Location: [facility] ORS;  Service: Cardiovascular;  Laterality: N/A;  . CAROTID ENDARTERECTOMY     [facility]; Dr. Lucky Cowboy  . CHOLECYSTECTOMY    . foot infection Right   . LEG SURGERY     right;infection  . RIGHT/LEFT HEART CATH AND CORONARY ANGIOGRAPHY Bilateral 03/12/2017   Procedure: RIGHT/LEFT HEART CATH AND CORONARY ANGIOGRAPHY;  Surgeon: Wellington Hampshire, MD;  Location: [facility] LAB;  Service: Cardiovascular;  Laterality: Bilateral;  . TONSILLECTOMY       FAMILY HISTORY   Family History  Problem Relation Age of Onset  . Heart disease Mother   . Diabetes Mother   . Heart disease Father   .  Hypertension Father   . Diabetes Maternal Grandmother      SOCIAL HISTORY   Social History   Tobacco Use  . Smoking status: Former Smoker    Packs/day: 0.50    Years: 5.00    Pack years: 2.50    Types: Cigarettes  . Smokeless tobacco: Never Used  Substance Use Topics  .  Alcohol use: No  . Drug use: No     MEDICATIONS   Current Medication:  Current Facility-Administered Medications:  .  0.9 %  sodium chloride infusion, 250 mL, Intravenous, PRN, Lang Snow, NP, Stopped at 01/17/19 1010 .  acetaminophen (TYLENOL) tablet 650 mg, 650 mg, Oral, Q4H PRN, Lang Snow, NP, 650 mg at 01/18/19 0318 .  albumin human 25 % solution 12.5 g, 12.5 g, Intravenous, Daily, Murlean Iba, MD, Stopped at 01/20/19 1011 .  atorvastatin (LIPITOR) tablet 40 mg, 40 mg, Oral, Daily, Ouma, Bing Neighbors, NP, 40 mg at 01/20/19 1653 .  cefTRIAXone (ROCEPHIN) 1 g in sodium chloride 0.9 % 100 mL IVPB, 1 g, Intravenous, Q24H, Darel Hong D, NP, Last Rate: 200 mL/hr at 01/21/19 0823, 1 g at 01/21/19 0823 .  Chlorhexidine Gluconate Cloth 2 % PADS 6 each, 6 each, Topical, Q0600, Flora Lipps, MD, 6 each at 01/21/19 0808 .  furosemide (LASIX) 250 mg in dextrose 5 % 250 mL (1 mg/mL) infusion, 8 mg/hr, Intravenous, Continuous, Dunn, Ryan M, PA-C, Last Rate: 8 mL/hr at 01/20/19 1900, 8 mg/hr at 01/20/19 1900 .  HYDROcodone-acetaminophen (NORCO/VICODIN) 5-325 MG per tablet 1-2 tablet, 1-2 tablet, Oral, Q6H PRN, Flora Lipps, MD, 2 tablet at 01/20/19 1955 .  insulin aspart (novoLOG) injection 0-5 Units, 0-5 Units, Subcutaneous, QHS, Ouma, Bing Neighbors, NP, 2 Units at 01/20/19 1958 .  insulin aspart (novoLOG) injection 0-9 Units, 0-9 Units, Subcutaneous, TID WC, Lang Snow, NP, 2 Units at 01/21/19 [id number] .  levothyroxine (SYNTHROID) tablet 50 mcg, 50 mcg, Oral, QAC breakfast, Lang Snow, NP, 50 mcg at 01/21/19 [id number] .  milrinone (PRIMACOR) 20 MG/100 ML (0.2 mg/mL) infusion, 0.125 mcg/kg/min, Intravenous, Continuous, Benita Gutter, RPH, Last Rate: 5.21 mL/hr at 01/20/19 2308, 0.125 mcg/kg/min at 01/20/19 2308 .  ondansetron (ZOFRAN) injection 4 mg, 4 mg, Intravenous, Q6H PRN, Ouma, Bing Neighbors, NP .  pantoprazole (PROTONIX) injection  40 mg, 40 mg, Intravenous, Q12H, Darel Hong D, NP, 40 mg at 01/21/19 0807 .  polyethylene glycol (MIRALAX / GLYCOLAX) packet 17 g, 17 g, Oral, Daily PRN, Vanga, Tally Due, MD .  potassium chloride (KLOR-CON) packet 40 mEq, 40 mEq, Oral, Once, Dallie Piles, RPH .  sodium chloride flush (NS) 0.9 % injection 3 mL, 3 mL, Intravenous, Q12H, Ouma, Bing Neighbors, NP, 3 mL at 01/21/19 0808 .  sodium chloride flush (NS) 0.9 % injection 3 mL, 3 mL, Intravenous, PRN, Ouma, Bing Neighbors, NP    ALLERGIES   Patient has no known allergies.    REVIEW OF SYSTEMS    10 point ROS conducted is negative except for right flank pain and abdominal discomfort.  PHYSICAL EXAMINATION   Vital Signs: Temp:  [97.5 F (36.4 C)-98 F (36.7 C)] 98 F (36.7 C) (10/13 0400) Pulse Rate:  [105-118] 118 (10/13 0800) Resp:  [12-24] 23 (10/13 0800) BP: (92-110)/(55-80) 106/80 (10/13 0800) SpO2:  [89 %-96 %] 93 % (10/13 0800) Weight:  [139.5 kg] 139.5 kg (10/13 0400)  GENERAL:chronically ill-appearing HEAD: Normocephalic, atraumatic.  EYES: Pupils equal, round, reactive to light.  No scleral icterus.  MOUTH:  Moist mucosal membrane. NECK: Supple. No thyromegaly. No nodules. No JVD.  PULMONARY: Crackles bilaterally with decreased breath sounds CARDIOVASCULAR: S1 and S2. Regular rate and rhythm. No murmurs, rubs, or gallops.  GASTROINTESTINAL: Soft, nontender, non-distended. No masses. Positive bowel sounds. No hepatosplenomegaly.  MUSCULOSKELETAL: 3+ lower extremity edema NEUROLOGIC: Mild distress due to acute illness SKIN:intact,warm,dry   PERTINENT DATA     Infusions: . sodium chloride Stopped (01/17/19 1010)  . albumin human Stopped (01/20/19 1011)  . cefTRIAXone (ROCEPHIN)  IV 1 g (01/21/19 0823)  . furosemide (LASIX) infusion 8 mg/hr (01/20/19 1900)  . milrinone 0.125 mcg/kg/min (01/20/19 2308)   Scheduled Medications: . atorvastatin  40 mg Oral Daily  . Chlorhexidine  Gluconate Cloth  6 each Topical Q0600  . insulin aspart  0-5 Units Subcutaneous QHS  . insulin aspart  0-9 Units Subcutaneous TID WC  . levothyroxine  50 mcg Oral QAC breakfast  . pantoprazole  40 mg Intravenous Q12H  . potassium chloride  40 mEq Oral Once  . sodium chloride flush  3 mL Intravenous Q12H   PRN Medications: sodium chloride, acetaminophen, HYDROcodone-acetaminophen, ondansetron (ZOFRAN) IV, polyethylene glycol, sodium chloride flush Hemodynamic parameters:   Intake/Output: 10/12 0701 - 10/13 0700 In: 1224.8 [P.O.:840; I.V.:234.7; IV Piggyback:150.1] Out: 4255 [Urine:4255]  Ventilator  Settings:     LAB RESULTS:  Basic Metabolic Panel: Recent Labs  Lab 01/17/19 0011 01/18/19 0654 01/19/19 0526 01/20/19 0510 01/21/19 0457  NA 133* 136 136 133* 134*  K 5.0 4.6 4.0 3.8 3.5  CL 100 101 101 100 97*  CO2 24 24 25 26 28   GLUCOSE 119* 70 158* 164* 166*  BUN 74* 71* 72* 61* 53*  CREATININE 3.82* 3.49* 3.25* 2.86* 2.48*  CALCIUM 7.7* 7.8* 7.7* 7.8* 7.8*  MG  --  2.3  --   --  2.2  PHOS  --  5.2*  --   --  3.3   Liver Function Tests: Recent Labs  Lab 01/17/19 0011 01/17/19 1305  AST 42* 42*  ALT 23 23  ALKPHOS 103 79  BILITOT 1.3* 1.2  PROT 7.7 7.1  ALBUMIN 2.5* 2.2*   Recent Labs  Lab 01/17/19 1305  LIPASE 15  AMYLASE 30   No results for input(s): AMMONIA in the last 168 hours. CBC: Recent Labs  Lab 01/17/19 0011  01/18/19 2339 01/19/19 0526 01/19/19 2053 01/20/19 0510 01/20/19 1313 01/21/19 0457  WBC 6.6   < > 6.2 5.3 5.5 5.4  --  5.2  NEUTROABS 4.7  --   --   --   --   --   --  3.3  HGB 6.7*   < > 7.2* 7.0* 7.1* 6.9* 7.5* 7.6*  HCT 21.7*   < > 23.1* 22.5* 23.4* 22.8* 23.8* 24.4*  MCV 81.9   < > 83.1 82.7 84.8 84.4  --  84.4  PLT 193   < > 196 185 176 195  --  206   < > = values in this interval not displayed.   Cardiac Enzymes: No results for input(s): CKTOTAL, CKMB, CKMBINDEX, TROPONINI in the last 168 hours. BNP: Invalid  input(s): POCBNP CBG: Recent Labs  Lab 01/20/19 1629 01/20/19 1947 01/20/19 2359 01/21/19 0426 01/21/19 0731  GLUCAP 211* 223* 194* 160* 152*     IMAGING RESULTS:  Imaging:    ASSESSMENT AND PLAN    -Multidisciplinary rounds held today  Acute Hypoxic Respiratory Failure   -Due to acute on chronic decompensated systolic CHF -continue BiPAP nocturnally -Chest x-ray  reviewed - interval improvement of interstitial edema   Acute on chronic decompensated systolic CHF with EF less than 25%    -Cardiology on case appreciate input-Dr. Arida/Ryan Dunn PA -Continue with Primacor gtt. as well as Lasix GTT and albumin daily -Strict I's and O's -1200 cc fluid restriction per 24 hours -Nocturnal BiPAP ICU monitoring   Circulatory shock    -Likely cardiogenic due to above    -Treat heart failure as above    -Cortisol borderline low for acutely ill state - solucortef bid 100mg     Acute blood loss anemia  -Due to lower GI bleed with melanotic stools per rectum   -Patient again had a decrement in H&H and received 1 unit PRBC today-continue to monitor H&H closely -Protonix 40 mg IV twice daily -Status post GI evaluation-recommendation to refer back to GI post stabilization of decompensated CHF -continue Foley Catheter-assess need daily -Prophylactic Rocephin IV   Acute on chronic renal failure KDIGO IV   -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   -Nephrology on case-appreciate input   -Avoid hypotension and nephrotoxins   -Nonoliguric at this time-remains on Lasix GTT currently   Atrial fibrillation   -Chronic, cardiology on case-DC amiodarone appreciate input    Hypothyroidism -Chronic Continue home levothyroxine   ID -continue IV abx as prescibed -follow up cultures  GI/Nutrition GI PROPHYLAXIS as indicated DIET-->TF's as tolerated Constipation protocol as indicated-Dulcolax suppository today due to constipation  x3 days  ENDO - ICU hypoglycemic\Hyperglycemia protocol -check FSBS per protocol   ELECTROLYTES -follow labs as needed -replace as needed -pharmacy consultation   DVT/GI PRX ordered -SCDs  TRANSFUSIONS AS NEEDED MONITOR FSBS ASSESS the need for LABS as needed   Critical care provider statement:    Critical care time (minutes):  32   Critical care time was exclusive of:  Separately billable procedures and treating other patients   Critical care was necessary to treat or prevent imminent or life-threatening deterioration of the following conditions:   Acute on chronic decompensated systolic CHF, acute hypoxemic respiratory failure, acute blood loss anemia, multiple comorbid conditions   Critical care was time spent personally by me on the following activities:  Development of treatment plan with patient or surrogate, discussions with consultants, evaluation of patient's response to treatment, examination of patient, obtaining history from patient or surrogate, ordering and performing treatments and interventions, ordering and review of laboratory studies and re-evaluation of patient's condition.  I assumed direction of critical care for this patient from another provider in my specialty: no    This document was prepared using Dragon voice recognition software and may include unintentional dictation errors.    Ottie Glazier, M.D.  Division of [place]

## 2019-01-21 NOTE — Progress Notes (Addendum)
Progress Note  Patient Name: Meghan [name] Date of Encounter: 01/21/2019  Primary Cardiologist: Fletcher Anon  Subjective   She reports improved SOB. No chest pain or palpitations. Changed to Lasix gtt on 10/12 with continuation of milrinone infusion. Remains massively volume up. Documented UOP of 3.0 L for the past 24 hours with a net - 5.5 L for the admission. Documented weight of 139.5 kg from this morning with a baseline weight around 104 kg. Renal function continues to improve. Potassium 3.5. HGB low, though stable at 7.6.   Inpatient Medications    Scheduled Meds: . atorvastatin  40 mg Oral Daily  . Chlorhexidine Gluconate Cloth  6 each Topical Q0600  . insulin aspart  0-5 Units Subcutaneous QHS  . insulin aspart  0-9 Units Subcutaneous TID WC  . levothyroxine  50 mcg Oral QAC breakfast  . pantoprazole  40 mg Intravenous Q12H  . sodium chloride flush  3 mL Intravenous Q12H   Continuous Infusions: . sodium chloride Stopped (01/17/19 1010)  . albumin human Stopped (01/20/19 1011)  . cefTRIAXone (ROCEPHIN)  IV Stopped (01/20/19 0909)  . furosemide (LASIX) infusion 8 mg/hr (01/20/19 1900)  . milrinone 0.125 mcg/kg/min (01/20/19 2308)   PRN Meds: sodium chloride, acetaminophen, HYDROcodone-acetaminophen, ondansetron (ZOFRAN) IV, polyethylene glycol, sodium chloride flush   Vital Signs    Vitals:   01/20/19 1900 01/20/19 1950 01/21/19 0100 01/21/19 0400  BP: 101/69     Pulse: (!) 105     Resp: 12     Temp:  97.8 F (36.6 C) 97.7 F (36.5 C) 98 F (36.7 C)  TempSrc:  Oral Oral   SpO2: 96%     Weight:    (!) 139.5 kg  Height:        Intake/Output Summary (Last 24 hours) at 01/21/2019 0801 Last data filed at 01/21/2019 0400 Gross per 24 hour  Intake 1224.81 ml  Output 4255 ml  Net -3030.19 ml   Filed Weights   01/17/19 0439 01/19/19 0500 01/21/19 0400  Weight: (!) 139 kg (!) 138.9 kg (!) 139.5 kg    Telemetry    Afib with RVR, low 100s to 110s bpm, rare PVC -  Personally Reviewed  ECG    No new tracings - Personally Reviewed  Physical Exam   GEN: No acute distress.   Neck: No JVD. Cardiac: Mildly tachycardic, irregularly irregular, no murmurs, rubs, or gallops.  Respiratory: Diminished breath sounds bilaterally with crackles along the bilateral bases on anterior exam.  GI: Obese, soft, nontender, distended with edema noted   MS: 2+ pitting lower extremity edema; No deformity. Neuro:  Alert and oriented x 3; Nonfocal.  Psych: Normal affect.  Labs    Chemistry Recent Labs  Lab 01/17/19 0011 01/17/19 1305  01/19/19 0526 01/20/19 0510 01/21/19 0457  NA 133*  --    < > 136 133* 134*  K 5.0  --    < > 4.0 3.8 3.5  CL 100  --    < > 101 100 97*  CO2 24  --    < > 25 26 28   GLUCOSE 119*  --    < > 158* 164* 166*  BUN 74*  --    < > 72* 61* 53*  CREATININE 3.82*  --    < > 3.25* 2.86* 2.48*  CALCIUM 7.7*  --    < > 7.7* 7.8* 7.8*  PROT 7.7 7.1  --   --   --   --  ALBUMIN 2.5* 2.2*  --   --   --   --   AST 42* 42*  --   --   --   --   ALT 23 23  --   --   --   --   ALKPHOS 103 79  --   --   --   --   BILITOT 1.3* 1.2  --   --   --   --   GFRNONAA 11*  --    < > 14* 16* 19*  GFRAA 13*  --    < > 16* 19* 22*  ANIONGAP 9  --    < > 10 7 9    < > = values in this interval not displayed.     Hematology Recent Labs  Lab 01/19/19 2053 01/20/19 0510 01/20/19 1313 01/21/19 0457  WBC 5.5 5.4  --  5.2  RBC 2.76* 2.70*  --  2.89*  HGB 7.1* 6.9* 7.5* 7.6*  HCT 23.4* 22.8* 23.8* 24.4*  MCV 84.8 84.4  --  84.4  MCH 25.7* 25.6*  --  26.3  MCHC 30.3 30.3  --  31.1  RDW 19.0* 18.8*  --  19.0*  PLT 176 195  --  206    Cardiac EnzymesNo results for input(s): TROPONINI in the last 168 hours. No results for input(s): TROPIPOC in the last 168 hours.   BNP Recent Labs  Lab 01/17/19 0011  BNP 790.0*     DDimer No results for input(s): DDIMER in the last 168 hours.   Radiology    Dg Chest Port 1 View  Result Date: 01/20/2019  CLINICAL DATA:  68 year old female with a history of pulmonary disease EXAM: PORTABLE CHEST 1 VIEW COMPARISON:  01/17/2019 12/13/2018, 07/26/2016 FINDINGS: Cardiomediastinal silhouette unchanged in size and contour. No pneumothorax.  No pleural effusion. Reticulonodular opacities of the lungs, similar pattern to the most recent plain film studies of 01/17/2019 and 12/13/2018. There has been progression from the prior plain film of 2018. No acute displaced fracture IMPRESSION: Similar appearance of reticulonodular lung opacities to the plain film dated 01/17/2019, 12/13/2018 though progressed from 07/26/2016. this may reflect progression of chronic fibrosis/scarring, however, superimposed multifocal infection difficult to exclude. Electronically Signed   By: Corrie Mckusick D.O.   On: 01/20/2019 16:00    Cardiac Studies   2D echo 12/2018: 1. The left ventricle has severely reduced systolic function, with an ejection fraction of 25-30%. The cavity size was mildly dilated. Left ventricular diastolic Doppler parameters are indeterminate. Left ventricular diffuse hypokinesis.  2. The right ventricle has mildly reduced systolic function. The cavity was moderately enlarged. There is no increase in right ventricular wall thickness. Right ventricular systolic pressure is moderately elevated with an estimated pressure of 56.2  mmHg.  3. Left atrial size was severely dilated.  4. Right atrial size was moderately dilated.  5. Tricuspid valve regurgitation is moderate.  6. Rhythm is atrial fibrillation  Patient Profile     68 y.o. female with history of CAD as detailed below, HFrEF secondary to ICM with prior hyperkalemia with spironolactone in the setting of renal failure, persistent A. fib s/p recent DCCV on 01/03/2019 that was briefly successful on amiodarone and Coumadin, CVA, anemia, carotid artery disease s/p left-sided CEA, DM, HTN, HLD who is being seen today for the evaluation of CHF.  Assessment & Plan     1. Acute on chronic HFrEF secondary to ICM with likely cardiorenal syndrome: -She remains massively volume overloaded, though  is improving -Continue IV Lasix gtt at 8 mg/hr for now (was receiving Lasix IV 40 mg q 8 hours as of 10/11) -Continue milrinone gtt at current dose  -She will require extensive inpatient IV diuresis   2. Persistent Afib: -Ventricular rates are reasonably well controlled -Anticoagulation on hold given bleed/anemia -Amiodarone stopped given she is no longer on anticoagulation in an effort to reduce risk of pharmacological conversion  3. GI bleed/acute blood loss anemia: -HGB remains low, though stable -Transfuse to goal HGB > 9  4. Acute on CKD stage III: -Continues to improve with milrinone and IV diuresis  -Monitor  5. Lower extremity edema: -Likely multifactorial including anemia, hypoalbuminemia   For questions or updates, please contact [place] Please consult [email or web address] for contact info under Cardiology/STEMI.    Signed, Christell Faith, PA-C [place] Pager: [phone] 01/21/2019, 8:01 AM

## 2019-01-21 NOTE — Progress Notes (Signed)
OT Cancellation Note  Patient Details Name: Meghan [name] MRN: [id number] DOB: 04-20-1950   Cancelled Treatment:    Reason Eval/Treat Not Completed: Fatigue/lethargy limiting ability to participate. Pt recently finished working with PT. Per PT, pt nursing preparing to give pt a bath. Will hold OT treatment and re-attempt at later date/time as medically appropriate.   Jeni Salles, MPH, MS, OTR/L ascom [phone] 01/21/19, 4:54 PM

## 2019-01-21 NOTE — Progress Notes (Signed)
Physical Therapy Treatment Patient Details Name: Meghan [name] MRN: [id number] DOB: 08-29-1950 Today's Date: 01/21/2019    History of Present Illness From MD H&P: Pt is a 68 y.o. female with pertinent past medical history of chronic systolic heart failure CAD, persistent atrial fibrillation s/p cardioversion on Coumadin, diabetes mellitus, chronic anemia, CKD, hyperlipidemia, carotid artery disease s/p left carotid endarterectomy with previous CVA, and hypertension into the ED with chief complaints of right flank pain, shortness of breath and rectal bleed.  MD assessment includes: Progressive cardio-renal syndrome in setting of morbid obesity, systolic cardiac failure with EF 25%, GI bleed, AKI/renal failure, and severe hypoglycemia.    PT Comments    Per discussion with Christell Faith (cardiology) regarding milrinone infusion, focused on gentle bed ex's at this time (see below for details).  Pt tolerated LE ex's in bed fairly well (vitals stable); limited session d/t plan for pt to get bath later and pt wanted to conserve energy.  Will continue to focus on bed level ex's and progress functional mobility as medically appropriate.   Follow Up Recommendations  Home health PT;Supervision for mobility/OOB     Equipment Recommendations  None recommended by PT    Recommendations for Other Services       Precautions / Restrictions Precautions Precautions: Fall Precaution Comments: Keep O2 >/= 92%; milrinone infusion precautions Restrictions Weight Bearing Restrictions: No  Secure messaged Christell Faith (cardiology) 10/13-regarding pt on milrinone infusion (and also on Lasix gtt) and precautions/contraindications for therapy-who recommended gentle bed ex's at this time (concern for some associated hypotension and arrhythmias with milrinone which could be exacerbated by pt's underlying anemia).   Mobility  Bed Mobility                  Transfers                    Ambulation/Gait                  Stairs             Wheelchair Mobility    Modified Rankin (Stroke Patients Only)       Balance                                            Cognition Arousal/Alertness: Awake/alert Behavior During Therapy: WFL for tasks assessed/performed Overall Cognitive Status: Within Functional Limits for tasks assessed                                        Exercises Total Joint Exercises Ankle Circles/Pumps: AROM;Strengthening;Both;10 reps;Supine Quad Sets: AROM;Strengthening;Both;10 reps;Supine Short Arc Quad: AROM;Strengthening;Both;10 reps;Supine Heel Slides: AAROM;Strengthening;Both;10 reps;Supine Hip ABduction/ADduction: AAROM;Strengthening;Both;10 reps;Supine    General Comments   Nursing cleared pt for participation in physical therapy.  Pt agreeable to PT session.      Pertinent Vitals/Pain Pain Assessment: 0-10 Pain Score: 0-No pain Pain Location: R hip and low back pain with certain LE movements; no pain at rest Pain Descriptors / Indicators: Sore Pain Intervention(s): Limited activity within patient's tolerance;Monitored during session;Repositioned    Home Living                      Prior Function  PT Goals (current goals can now be found in the care plan section) Acute Rehab PT Goals Patient Stated Goal: To return to PLOF PT Goal Formulation: With patient Time For Goal Achievement: 01/31/19 Potential to Achieve Goals: Good Progress towards PT goals: Progressing toward goals(with LE strengthening)    Frequency    Min 2X/week      PT Plan Current plan remains appropriate    Co-evaluation              AM-PAC PT "6 Clicks" Mobility   Outcome Measure  Help needed turning from your back to your side while in a flat bed without using bedrails?: A Lot   Help needed moving to and from a bed to a chair (including a wheelchair)?: A Lot Help needed standing up from a  chair using your arms (e.g., wheelchair or bedside chair)?: A Lot Help needed to walk in hospital room?: Total Help needed climbing 3-5 steps with a railing? : Total 6 Click Score: 8    End of Session Equipment Utilized During Treatment: Oxygen Activity Tolerance: Patient tolerated treatment well Patient left: in bed;with call bell/phone within reach;with family/visitor present;with SCD's reapplied Nurse Communication: (Therapy session) PT Visit Diagnosis: Muscle weakness (generalized) (M62.81);Difficulty in walking, not elsewhere classified (R26.2)     Time: [id number] PT Time Calculation (min) (ACUTE ONLY): 17 min  Charges:  $Therapeutic Exercise: 8-22 mins                     Leitha Bleak, PT 01/21/19, 4:30 PM [phone]

## 2019-01-22 DIAGNOSIS — I4819 Other persistent atrial fibrillation: Secondary | ICD-10-CM | POA: Diagnosis not present

## 2019-01-22 DIAGNOSIS — I5023 Acute on chronic systolic (congestive) heart failure: Secondary | ICD-10-CM | POA: Diagnosis not present

## 2019-01-22 LAB — CBC
HCT: 23.6 % — ABNORMAL LOW (ref 36.0–46.0)
Hemoglobin: 7.4 g/dL — ABNORMAL LOW (ref 12.0–15.0)
MCH: 26.4 pg (ref 26.0–34.0)
MCHC: 31.4 g/dL (ref 30.0–36.0)
MCV: 84.3 fL (ref 80.0–100.0)
Platelets: 188 10*3/uL (ref 150–400)
RBC: 2.8 MIL/uL — ABNORMAL LOW (ref 3.87–5.11)
RDW: 19.3 % — ABNORMAL HIGH (ref 11.5–15.5)
WBC: 5.9 10*3/uL (ref 4.0–10.5)
nRBC: 0 % (ref 0.0–0.2)

## 2019-01-22 LAB — BASIC METABOLIC PANEL
Anion gap: 10 (ref 5–15)
BUN: 52 mg/dL — ABNORMAL HIGH (ref 8–23)
CO2: 29 mmol/L (ref 22–32)
Calcium: 7.8 mg/dL — ABNORMAL LOW (ref 8.9–10.3)
Chloride: 94 mmol/L — ABNORMAL LOW (ref 98–111)
Creatinine, Ser: 2.15 mg/dL — ABNORMAL HIGH (ref 0.44–1.00)
GFR calc Af Amer: 26 mL/min — ABNORMAL LOW (ref >60)
GFR calc non Af Amer: 23 mL/min — ABNORMAL LOW (ref >60)
Glucose, Bld: 342 mg/dL — ABNORMAL HIGH (ref 70–99)
Potassium: 3.4 mmol/L — ABNORMAL LOW (ref 3.5–5.1)
Sodium: 133 mmol/L — ABNORMAL LOW (ref 135–145)

## 2019-01-22 LAB — GLUCOSE, CAPILLARY
Glucose-Capillary: 191 mg/dL — ABNORMAL HIGH (ref 70–99)
Glucose-Capillary: 229 mg/dL — ABNORMAL HIGH (ref 70–99)
Glucose-Capillary: 267 mg/dL — ABNORMAL HIGH (ref 70–99)
Glucose-Capillary: 317 mg/dL — ABNORMAL HIGH (ref 70–99)
Glucose-Capillary: 325 mg/dL — ABNORMAL HIGH (ref 70–99)
Glucose-Capillary: 347 mg/dL — ABNORMAL HIGH (ref 70–99)
Glucose-Capillary: 354 mg/dL — ABNORMAL HIGH (ref 70–99)
Glucose-Capillary: 361 mg/dL — ABNORMAL HIGH (ref 70–99)
Glucose-Capillary: 394 mg/dL — ABNORMAL HIGH (ref 70–99)
Glucose-Capillary: 394 mg/dL — ABNORMAL HIGH (ref 70–99)
Glucose-Capillary: 427 mg/dL — ABNORMAL HIGH (ref 70–99)
Glucose-Capillary: 442 mg/dL — ABNORMAL HIGH (ref 70–99)

## 2019-01-22 MED ORDER — INSULIN REGULAR(HUMAN) IN NACL 100-0.9 UT/100ML-% IV SOLN
INTRAVENOUS | Status: DC
  Administered 2019-01-22: 3.3 [IU]/h via INTRAVENOUS
  Filled 2019-01-22 (×2): qty 100

## 2019-01-22 MED ORDER — INSULIN ASPART 100 UNIT/ML ~~LOC~~ SOLN
0.0000 [IU] | Freq: Three times a day (TID) | SUBCUTANEOUS | Status: DC
  Administered 2019-01-22: 20 [IU] via SUBCUTANEOUS
  Filled 2019-01-22: qty 1

## 2019-01-22 MED ORDER — POTASSIUM CHLORIDE 10 MEQ/100ML IV SOLN
10.0000 meq | INTRAVENOUS | Status: DC
  Filled 2019-01-22 (×3): qty 100

## 2019-01-22 MED ORDER — INSULIN GLARGINE 100 UNIT/ML ~~LOC~~ SOLN
10.0000 [IU] | Freq: Every day | SUBCUTANEOUS | Status: DC
  Administered 2019-01-22: 13:00:00 10 [IU] via SUBCUTANEOUS
  Filled 2019-01-22 (×2): qty 0.1

## 2019-01-22 MED ORDER — POTASSIUM CHLORIDE 20 MEQ PO PACK
40.0000 meq | PACK | Freq: Two times a day (BID) | ORAL | Status: AC
  Administered 2019-01-22 (×2): 40 meq via ORAL
  Filled 2019-01-22 (×2): qty 2

## 2019-01-22 NOTE — Progress Notes (Signed)
[place] [facility] [place] NAME: Meghan [name]    MR#:  [id number]  DATE OF BIRTH:  Apr 07, 1951  SUBJECTIVE:   Patient states she is feeling better today.  Her breathing has improved.  She states she is still having a lot of swelling in her legs.  She denies any chest pain or active bleeding.  REVIEW OF SYSTEMS:    Review of Systems  Constitutional: Positive for malaise/fatigue. Negative for chills and fever.  HENT: Negative.  Negative for sore throat.   Eyes: Negative for blurred vision, double vision and pain.  Respiratory: Positive for shortness of breath. Negative for cough, hemoptysis and wheezing.   Cardiovascular: Positive for leg swelling. Negative for chest pain, palpitations and orthopnea.  Gastrointestinal: Negative for abdominal pain, constipation, diarrhea, heartburn, nausea and vomiting.  Genitourinary: Negative for dysuria and hematuria.  Musculoskeletal: Negative for back pain and joint pain.  Skin: Negative for rash.  Neurological: Negative for sensory change, speech change, focal weakness and headaches.  Endo/Heme/Allergies: Does not bruise/bleed easily.  Psychiatric/Behavioral: Negative for depression. The patient is not nervous/anxious.     DRUG ALLERGIES:  No Known Allergies  VITALS:  Blood pressure 118/61, pulse (!) 115, temperature 98.3 F (36.8 C), temperature source Axillary, resp. rate 17, height 5\' 7"  (1.702 m), weight 133.8 kg, SpO2 94 %.  PHYSICAL EXAMINATION:   Physical Exam  GENERAL:  68 y.o.-year-old patient lying in the bed with no acute distress.  EYES: Pupils equal, round, reactive to light and accommodation. No scleral icterus. Extraocular muscles intact.  HEENT: Head atraumatic, normocephalic. Oropharynx and nasopharynx clear.  NECK:  Supple, no jugular venous distention. No thyroid enlargement, no tenderness.  LUNGS: Normal breath sounds bilaterally, no wheezing, rales, rhonchi. No use of accessory muscles  of respiration.  CARDIOVASCULAR: Tachycardic, Irregularly irregular rhythm, S1, S2 normal. No murmurs, rubs, or gallops.  ABDOMEN: Soft, nontender, nondistended. Bowel sounds present. No organomegaly or mass.  EXTREMITIES: No cyanosis, clubbing. 2+ bilateral pitting edema. NEUROLOGIC: Cranial nerves II through XII are intact. No focal Motor or sensory deficits b/l.   PSYCHIATRIC: The patient is alert and awake SKIN: No obvious rash, lesion, or ulcer.   LABORATORY PANEL:   CBC Recent Labs  Lab 01/22/19 0546  WBC 5.9  HGB 7.4*  HCT 23.6*  PLT 188   ------------------------------------------------------------------------------------------------------------------ Chemistries  Recent Labs  Lab 01/17/19 1305  01/21/19 0457 01/22/19 0546  NA  --    < > 134* 133*  K  --    < > 3.5 3.4*  CL  --    < > 97* 94*  CO2  --    < > 28 29  GLUCOSE  --    < > 166* 342*  BUN  --    < > 53* 52*  CREATININE  --    < > 2.48* 2.15*  CALCIUM  --    < > 7.8* 7.8*  MG  --    < > 2.2  --   AST 42*  --   --   --   ALT 23  --   --   --   ALKPHOS 79  --   --   --   BILITOT 1.2  --   --   --    < > = values in this interval not displayed.   ------------------------------------------------------------------------------------------------------------------  Cardiac Enzymes No results for input(s): TROPONINI in the last 168 hours. ------------------------------------------------------------------------------------------------------------------  RADIOLOGY:  No results found.  ASSESSMENT AND PLAN:   Acute on chronic systolic congestive heart failure-patient continues to be markedly volume overloaded. UOP 6.4L in the last 24 hours. -Last ECHO 12/2018 with severely reduced LV EF 20-25% -Continue Lasix gtt + albumin IV and milrinone gtt -Holding losartan and coreg due to hypotension -Cardiology following -BiPAP qhs  Cardiogenic shock- due to above -Solucortef 100mg  bid -Holding BP meds  GI  bleed- melena has resolved -s/p vitamin K to reverse Coumadin -GI following-will plan for endoscopy either closer to discharge once patient is more medically stable or as an outpatient -Continue IV Protonix bid  Acute blood loss anemia- due to above -Patient is s/p multiple transfusions -Serial H/H  AKI on CKD III- likely due to cardiorenal syndrome.  Creatinine is improving. -Nephrology following  Persistent atrial fibrillation - s/p cardioversion still in A. fib -Amiodarone discontinued per cardiology -Holding anticoagulation in the setting of GI bleed  Type 2 diabetes-recent A1c 7.2 -Continue SSI  Hypothyroidism-stable -Continue home Synthroid  History of CVA -Holding Coumadin  All the records are reviewed and case discussed with Care Management/Social Worker Management plans discussed with the patient, family and they are in agreement.  CODE STATUS: FULL CODE  TOTAL TIME TAKING CARE OF THIS PATIENT: 40 minutes.   POSSIBLE D/C unknown, DEPENDING ON CLINICAL CONDITION.  Berna Spare Elsie Sakuma M.D on 01/22/2019 at 3:22 PM  Between 7am to 6pm - Pager [phone]  After 6pm go to [email or web address] - password [email or web address] [facility]  Office  [phone]  CC: Primary care physician; [name], MD  Note: This dictation was prepared with Dragon dictation along with smaller phrase technology. Any transcriptional errors that result from this process are unintentional.

## 2019-01-22 NOTE — Progress Notes (Signed)
CRITICAL CARE PROGRESS NOTE    Name: Meghan [name] MRN: [id number] DOB: [birth year]     LOS: 5   SUBJECTIVE FINDINGS & SIGNIFICANT EVENTS      BRIEF PATIENT DESCRIPTION:  68 year old female admitted with acute hypoxic respiratory failure secondary to acute on chronic systolic CHF, acute blood loss anemia secondary to GI bleed, AKI on CKD, and right sided flank/back pain.   SIGNIFICANT EVENTS  10/9>>Admission to Stepdown 10/9>> GI, Nephrology, Cardiology consulted, started Milrinone infusion   STUDIES:  10/9 - CT Abdomen & Pelvis:1. Mild fat stranding around the pancreas, likely from generalized volume overload but please correlate with serum enzymes. 2. Small volume ascites accumulated around the liver. 3. Cardiomegaly and atherosclerosis.  10/9 - Venous Ultrasound LLE>>negative   CULTURES: SARS-CoV-2 PCR 10/9>> negative  ANTIBIOTICS: Ceftriaxone 10/9>>10/13 stopped rocephin    PAST MEDICAL HISTORY   Past Medical History:  Diagnosis Date   Carotid arterial disease ([place])    a. 02/2012 s/p L CEA.   CKD stage G3b/A1, GFR 30-44 and albumin creatinine ratio <30 mg/g    Coronary artery disease    a. 02/2012 Lex[facility] MV: Anterior, anteroseptal, septal, apical infarct with mild peri-infarct ischemia, EF 41%- medically managed due to anemia; b. 03/2017 Cath: LM nl, LAD 99ost/p/m - faint L->L collaterals, RI 20ost, LCX 77m, RCA 20p, 72m-->Med Rx for chronic subtotal LAD dzs. Avoid R Radial access in future.   Diabetes mellitus without complication ([place])    Type II   Hyperlipidemia    Hypertension    Ischemic cardiomyopathy    a. 06/2013 Echo: EF 35-40%, glob HK, mildly dil LA, mild LVH, mild TR, mild to mod MR; b. 07/2016 Echo: EF 25-30%, sev mid-apicalanteroseptal, ant, apical HK, mild MR, sev dil LA,  mod dil RA, mod TR, PASP 72mmHg; c. 11/2016 Echo: EF 20-25%, antsept DK, Gr2 DD, mild MR, mod dil LA/RV/RA, mod red RV fxn, mod-sev TR, PASP 40-75mmHg.   Morbid obesity ([place])    Normocytic anemia    Persistent atrial fibrillation ([facility])    a. CHA2DS2VASc = 7-->coumadin; maintaining sinus on amio.   Stroke [facility]) 2013     SURGICAL HISTORY   Past Surgical History:  Procedure Laterality Date   AMPUTATION TOE Left 09/22/2016   Procedure: AMPUTATION TOE;  Surgeon: Albertine Patricia, DPM;  Location: [facility] ORS;  Service: Podiatry;  Laterality: Left;   CARDIOVERSION N/A 07/28/2016   Procedure: CARDIOVERSION;  Surgeon: Wellington Hampshire, MD;  Location: [facility] ORS;  Service: Cardiovascular;  Laterality: N/A;   CARDIOVERSION N/A 08/07/2016   Procedure: CARDIOVERSION;  Surgeon: Wellington Hampshire, MD;  Location: [facility] ORS;  Service: Cardiovascular;  Laterality: N/A;   CARDIOVERSION N/A 01/03/2019   Procedure: CARDIOVERSION;  Surgeon: Minna Merritts, MD;  Location: [facility] ORS;  Service: Cardiovascular;  Laterality: N/A;   CAROTID ENDARTERECTOMY     [facility]; Dr. Lucky Cowboy   CHOLECYSTECTOMY     foot infection Right    LEG SURGERY     right;infection   RIGHT/LEFT HEART CATH AND CORONARY ANGIOGRAPHY Bilateral 03/12/2017   Procedure: RIGHT/LEFT HEART CATH AND CORONARY ANGIOGRAPHY;  Surgeon: Wellington Hampshire, MD;  Location: [place] CV LAB;  Service: Cardiovascular;  Laterality: Bilateral;   TONSILLECTOMY       FAMILY HISTORY   Family History  Problem Relation Age of Onset   Heart disease Mother    Diabetes Mother    Heart disease Father    Hypertension Father    Diabetes Maternal Grandmother  SOCIAL HISTORY   Social History   Tobacco Use   Smoking status: Former Smoker    Packs/day: 0.50    Years: 5.00    Pack years: 2.50    Types: Cigarettes   Smokeless tobacco: Never Used  Substance Use Topics   Alcohol use: No   Drug use: No     MEDICATIONS   Current  Medication:  Current Facility-Administered Medications:    0.9 %  sodium chloride infusion, 250 mL, Intravenous, PRN, Lang Snow, NP, Stopped at 01/17/19 1010   acetaminophen (TYLENOL) tablet 650 mg, 650 mg, Oral, Q4H PRN, Lang Snow, NP, 650 mg at 01/18/19 0318   albumin human 25 % solution 12.5 g, 12.5 g, Intravenous, Daily, Murlean Iba, MD, Last Rate: 60 mL/hr at 01/22/19 1001, 12.5 g at 01/22/19 1001   atorvastatin (LIPITOR) tablet 40 mg, 40 mg, Oral, Daily, Ouma, Bing Neighbors, NP, 40 mg at 01/21/19 1719   Chlorhexidine Gluconate Cloth 2 % PADS 6 each, 6 each, Topical, Q0600, Flora Lipps, MD, 6 each at 01/21/19 0808   furosemide (LASIX) 250 mg in dextrose 5 % 250 mL (1 mg/mL) infusion, 8 mg/hr, Intravenous, Continuous, Dunn, Ryan M, PA-C, Last Rate: 8 mL/hr at 01/22/19 0600, 8 mg/hr at 01/22/19 0600   HYDROcodone-acetaminophen (NORCO/VICODIN) 5-325 MG per tablet 1-2 tablet, 1-2 tablet, Oral, Q6H PRN, Flora Lipps, MD, 1 tablet at 01/22/19 0145   hydrocortisone sodium succinate (SOLU-CORTEF) 100 MG injection 100 mg, 100 mg, Intravenous, Q12H, Chamia Schmutz, MD, 100 mg at 01/22/19 1000   insulin aspart (novoLOG) injection 0-5 Units, 0-5 Units, Subcutaneous, QHS, Ouma, Bing Neighbors, NP, 4 Units at 01/21/19 2229   insulin aspart (novoLOG) injection 0-9 Units, 0-9 Units, Subcutaneous, TID WC, Ouma, Bing Neighbors, NP, 7 Units at 01/22/19 0746   insulin glargine (LANTUS) injection 10 Units, 10 Units, Subcutaneous, Daily, Dallie Piles, RPH   levothyroxine (SYNTHROID) tablet 50 mcg, 50 mcg, Oral, QAC breakfast, Ouma, Bing Neighbors, NP, 50 mcg at 01/22/19 0747   milrinone (PRIMACOR) 20 MG/100 ML (0.2 mg/mL) infusion, 0.125 mcg/kg/min, Intravenous, Continuous, Benita Gutter, RPH, Last Rate: 5.21 mL/hr at 01/22/19 0600, 0.125 mcg/kg/min at 01/22/19 0600   ondansetron (ZOFRAN) injection 4 mg, 4 mg, Intravenous, Q6H PRN, Stark Klein, Bing Neighbors, NP   pantoprazole (PROTONIX) injection 40 mg, 40 mg, Intravenous, Q12H, Darel Hong D, NP, 40 mg at 01/22/19 1000   polyethylene glycol (MIRALAX / GLYCOLAX) packet 17 g, 17 g, Oral, Daily PRN, Vanga, Tally Due, MD   potassium chloride (KLOR-CON) packet 40 mEq, 40 mEq, Oral, BID, Vallery Sa D, RPH   sodium chloride flush (NS) 0.9 % injection 3 mL, 3 mL, Intravenous, Q12H, Ouma, Bing Neighbors, NP, 3 mL at 01/22/19 1000   sodium chloride flush (NS) 0.9 % injection 3 mL, 3 mL, Intravenous, PRN, Ouma, Bing Neighbors, NP    ALLERGIES   Patient has no known allergies.    REVIEW OF SYSTEMS    10 point ROS conducted is negative except for right flank pain and abdominal discomfort.  PHYSICAL EXAMINATION   Vital Signs: Temp:  [98.1 F (36.7 C)-98.5 F (36.9 C)] 98.1 F (36.7 C) (10/14 0200) Pulse Rate:  [98-118] 117 (10/14 0600) Resp:  [12-19] 13 (10/14 0600) BP: (84-141)/(60-98) 141/81 (10/14 0600) SpO2:  [92 %-97 %] 96 % (10/14 0600) Weight:  [133.8 kg] 133.8 kg (10/14 0500)  GENERAL:chronically ill-appearing HEAD: Normocephalic, atraumatic.  EYES: Pupils equal, round, reactive to light.  No scleral icterus.  MOUTH:  Moist mucosal membrane. NECK: Supple. No thyromegaly. No nodules. No JVD.  PULMONARY: Crackles bilaterally with decreased breath sounds-improved CARDIOVASCULAR: S1 and S2. Regular rate and rhythm. No murmurs, rubs, or gallops.  GASTROINTESTINAL: Soft, nontender, non-distended. No masses. Positive bowel sounds. No hepatosplenomegaly.  MUSCULOSKELETAL: 3+ lower extremity edema NEUROLOGIC: Mild distress due to acute illness SKIN:intact,warm,dry   PERTINENT DATA     Infusions:  sodium chloride Stopped (01/17/19 1010)   albumin human 12.5 g (01/22/19 1001)   furosemide (LASIX) infusion 8 mg/hr (01/22/19 0600)   milrinone 0.125 mcg/kg/min (01/22/19 0600)   Scheduled Medications:  atorvastatin  40 mg Oral Daily   Chlorhexidine  Gluconate Cloth  6 each Topical Q0600   hydrocortisone sod succinate (SOLU-CORTEF) inj  100 mg Intravenous Q12H   insulin aspart  0-5 Units Subcutaneous QHS   insulin aspart  0-9 Units Subcutaneous TID WC   insulin glargine  10 Units Subcutaneous Daily   levothyroxine  50 mcg Oral QAC breakfast   pantoprazole  40 mg Intravenous Q12H   potassium chloride  40 mEq Oral BID   sodium chloride flush  3 mL Intravenous Q12H   PRN Medications: sodium chloride, acetaminophen, HYDROcodone-acetaminophen, ondansetron (ZOFRAN) IV, polyethylene glycol, sodium chloride flush Hemodynamic parameters:   Intake/Output: 10/13 0701 - 10/14 0700 In: 1657.4 [P.O.:1320; I.V.:302.3; IV Piggyback:35.1] Out: 6450 [id number]  Ventilator  Settings:     LAB RESULTS:  Basic Metabolic Panel: Recent Labs  Lab 01/18/19 0654 01/19/19 0526 01/20/19 0510 01/21/19 0457 01/22/19 0546  NA 136 136 133* 134* 133*  K 4.6 4.0 3.8 3.5 3.4*  CL 101 101 100 97* 94*  CO2 24 25 26 28 29   GLUCOSE 70 158* 164* 166* 342*  BUN 71* 72* 61* 53* 52*  CREATININE 3.49* 3.25* 2.86* 2.48* 2.15*  CALCIUM 7.8* 7.7* 7.8* 7.8* 7.8*  MG 2.3  --   --  2.2  --   PHOS 5.2*  --   --  3.3  --    Liver Function Tests: Recent Labs  Lab 01/17/19 0011 01/17/19 1305  AST 42* 42*  ALT 23 23  ALKPHOS 103 79  BILITOT 1.3* 1.2  PROT 7.7 7.1  ALBUMIN 2.5* 2.2*   Recent Labs  Lab 01/17/19 1305  LIPASE 15  AMYLASE 30   No results for input(s): AMMONIA in the last 168 hours. CBC: Recent Labs  Lab 01/17/19 0011  01/19/19 0526 01/19/19 2053 01/20/19 0510 01/20/19 1313 01/21/19 0457 01/22/19 0546  WBC 6.6   < > 5.3 5.5 5.4  --  5.2 5.9  NEUTROABS 4.7  --   --   --   --   --  3.3  --   HGB 6.7*   < > 7.0* 7.1* 6.9* 7.5* 7.6* 7.4*  HCT 21.7*   < > 22.5* 23.4* 22.8* 23.8* 24.4* 23.6*  MCV 81.9   < > 82.7 84.8 84.4  --  84.4 84.3  PLT 193   < > 185 176 195  --  206 188   < > = values in this interval not displayed.    Cardiac Enzymes: No results for input(s): CKTOTAL, CKMB, CKMBINDEX, TROPONINI in the last 168 hours. BNP: Invalid input(s): POCBNP CBG: Recent Labs  Lab 01/21/19 1954 01/21/19 2223 01/22/19 0002 01/22/19 0406 01/22/19 0716  GLUCAP 294* 329* 354* 347* 317*     IMAGING RESULTS:  Imaging:    ASSESSMENT AND PLAN    -Multidisciplinary rounds held today  Acute Hypoxic Respiratory Failure   -Due  to acute on chronic decompensated systolic CHF -continue BiPAP nocturnally -Chest x-ray reviewed - interval improvement of interstitial edema   Acute on chronic decompensated systolic CHF with EF less than 25%    -Cardiology on case appreciate input-Dr. Okey Regal PA -Wean Primacor gtt. Continue Lasix GTT and albumin daily -Strict I's and O's -1200 cc fluid restriction per 24 hours -Nocturnal BiPAP ICU monitoring   Circulatory shock    -Likely cardiogenic due to above    -Treat heart failure as above    -Cortisol borderline low for acutely ill state - solucortef bid 100mg     Acute blood loss anemia  -Due to lower GI bleed with melanotic stools per rectum   -Patient again had a decrement in H&H and received 1 unit PRBC today-continue to monitor H&H closely -Protonix 40 mg IV twice daily -Status post GI evaluation-recommendation to refer back to GI post stabilization of decompensated CHF -continue Foley Catheter-assess need daily -Prophylactic Rocephin IV-has been stopped    Acute on chronic renal failure KDIGO IV   -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   -Nephrology on case-appreciate input   -Avoid hypotension and nephrotoxins   -Nonoliguric at this time-remains on Lasix GTT currently   Atrial fibrillation   -Chronic, cardiology on case-DC amiodarone appreciate input    Hypothyroidism -Chronic Continue home levothyroxine   ID -continue IV abx as prescibed -follow up cultures  GI/Nutrition GI  PROPHYLAXIS as indicated DIET-->TF's as tolerated Constipation protocol as indicated-Dulcolax suppository today due to constipation x3 days  ENDO - ICU hypoglycemic\Hyperglycemia protocol-severe hyperglycemia - Diabetic coordinator and PharmD collaboration appreciated.  -check FSBS per protocol   ELECTROLYTES -follow labs as needed -replace as needed -pharmacy consultation   DVT/GI PRX ordered -SCDs  TRANSFUSIONS AS NEEDED MONITOR FSBS ASSESS the need for LABS as needed   Critical care provider statement:    Critical care time (minutes):  33   Critical care time was exclusive of:  Separately billable procedures and treating other patients   Critical care was necessary to treat or prevent imminent or life-threatening deterioration of the following conditions:   Acute on chronic decompensated systolic CHF, acute hypoxemic respiratory failure, acute blood loss anemia, multiple comorbid conditions   Critical care was time spent personally by me on the following activities:  Development of treatment plan with patient or surrogate, discussions with consultants, evaluation of patient's response to treatment, examination of patient, obtaining history from patient or surrogate, ordering and performing treatments and interventions, ordering and review of laboratory studies and re-evaluation of patient's condition.  I assumed direction of critical care for this patient from another provider in my specialty: no    This document was prepared using Dragon voice recognition software and may include unintentional dictation errors.    Ottie Glazier, M.D.  Division of [place]

## 2019-01-22 NOTE — Consult Note (Addendum)
PHARMACY CONSULT NOTE  Pharmacy Consult for Electrolyte Monitoring and Replacement   Recent Labs: Potassium (mmol/L)  Date Value  01/22/2019 3.4 (L)  07/10/2013 3.6   Magnesium (mg/dL)  Date Value  01/21/2019 2.2  07/10/2013 1.5 (L)   Calcium (mg/dL)  Date Value  01/22/2019 7.8 (L)   Calcium, Total (mg/dL)  Date Value  07/10/2013 8.1 (L)   Albumin (g/dL)  Date Value  01/17/2019 2.2 (L)  06/15/2017 3.5 (L)  07/08/2013 1.7 (L)   Phosphorus (mg/dL)  Date Value  01/21/2019 3.3   Sodium (mmol/L)  Date Value  01/22/2019 133 (L)  12/24/2018 135  07/10/2013 134 (L)   Corrected Ca: 9.24 mg/dL  Assessment: 68 y.o. female with medical problems of chronic systolic congestive heart failure, coronary disease, atrial fibrillation requiring anticoagulation, diabetes, anemia, carotid artery disease with history of left carotid endarterectomy, previous stroke, hypertension, chronic kidney disease, was admitted on10/9/2020with shortness of breath worsening edema, acute kidney injury. This patient was started on a Lasix drip 10/12  Goal of Therapy: due to afib and other cardiac PMH: Potassium: 4.0-5.1 mmol/L Magnesium: 2.0-2.4 mg/dL All other electrolytes wnl  Plan:   Replace potassium with 40 mEq KCl BID x 2  BMP, magnesium in am  Dallie Piles ,PharmD Clinical Pharmacist 01/22/2019 8:08 AM

## 2019-01-22 NOTE — Progress Notes (Signed)
[place] 01/22/19  Subjective:  Urine output good at 6.4 L over the preceding 24 hours. Creatinine remained stable at 2.2. Patient states that she feels her lower extremity edema is getting better.   Objective:  Vital signs in last 24 hours:  Temp:  [98.1 F (36.7 C)-98.5 F (36.9 C)] 98.3 F (36.8 C) (10/14 1200) Pulse Rate:  [98-120] 115 (10/14 1300) Resp:  [12-20] 17 (10/14 1300) BP: (93-141)/(61-98) 118/61 (10/14 1300) SpO2:  [93 %-97 %] 94 % (10/14 1300) Weight:  [133.8 kg] 133.8 kg (10/14 0500)  Weight change: -5.7 kg Filed Weights   01/19/19 0500 01/21/19 0400 01/22/19 0500  Weight: (!) 138.9 kg (!) 139.5 kg 133.8 kg    Intake/Output:    Intake/Output Summary (Last 24 hours) at 01/22/2019 1357 Last data filed at 01/22/2019 1200 Gross per 24 hour  Intake 1549.5 ml  Output 5600 ml  Net -4050.5 ml    Physical Exam: General:  No acute distress, obese female, laying in the bed  HEENT  moist oral mucous membranes  Neck:  Supple  Lungs:  mild crackles at bases, [place] O2  Heart::  S1S2 no rubs  Abdomen:  Soft, nontender  Extremities:  2+ pitting edema  Neurologic:  Alert, able to answer questions  Skin:  Warm, dry  Foley in place    Basic Metabolic Panel:  Recent Labs  Lab 01/18/19 0654 01/19/19 0526 01/20/19 0510 01/21/19 0457 01/22/19 0546  NA 136 136 133* 134* 133*  K 4.6 4.0 3.8 3.5 3.4*  CL 101 101 100 97* 94*  CO2 24 25 26 28 29   GLUCOSE 70 158* 164* 166* 342*  BUN 71* 72* 61* 53* 52*  CREATININE 3.49* 3.25* 2.86* 2.48* 2.15*  CALCIUM 7.8* 7.7* 7.8* 7.8* 7.8*  MG 2.3  --   --  2.2  --   PHOS 5.2*  --   --  3.3  --      CBC: Recent Labs  Lab 01/17/19 0011  01/19/19 0526 01/19/19 2053 01/20/19 0510 01/20/19 1313 01/21/19 0457 01/22/19 0546  WBC 6.6   < > 5.3 5.5 5.4  --  5.2 5.9  NEUTROABS 4.7  --   --   --   --   --  3.3  --   HGB 6.7*   < > 7.0* 7.1* 6.9* 7.5* 7.6* 7.4*  HCT 21.7*   < >  22.5* 23.4* 22.8* 23.8* 24.4* 23.6*  MCV 81.9   < > 82.7 84.8 84.4  --  84.4 84.3  PLT 193   < > 185 176 195  --  206 188   < > = values in this interval not displayed.     No results found for: HEPBSAG, HEPBSAB, HEPBIGM    Microbiology:  Recent Results (from the past 240 hour(s))  SARS Coronavirus 2 by RT PCR (hospital order, performed in [facility] hospital lab) Nasopharyngeal Nasopharyngeal Swab     Status: None   Collection Time: 01/17/19  1:41 AM   Specimen: Nasopharyngeal Swab  Result Value Ref Range Status   SARS Coronavirus 2 NEGATIVE NEGATIVE Final    Comment: (NOTE) If result is NEGATIVE SARS-CoV-2 target nucleic acids are NOT DETECTED. The SARS-CoV-2 RNA is generally detectable in upper and lower  respiratory specimens during the acute phase of infection. The lowest  concentration of SARS-CoV-2 viral copies this assay can detect is 250  copies / mL. A negative result does not preclude SARS-CoV-2 infection  and  should not be used as the sole basis for treatment or other  patient management decisions.  A negative result may occur with  improper specimen collection / handling, submission of specimen other  than nasopharyngeal swab, presence of viral mutation(s) within the  areas targeted by this assay, and inadequate number of viral copies  (<250 copies / mL). A negative result must be combined with clinical  observations, patient history, and epidemiological information. If result is POSITIVE SARS-CoV-2 target nucleic acids are DETECTED. The SARS-CoV-2 RNA is generally detectable in upper and lower  respiratory specimens dur ing the acute phase of infection.  Positive  results are indicative of active infection with SARS-CoV-2.  Clinical  correlation with patient history and other diagnostic information is  necessary to determine patient infection status.  Positive results do  not rule out bacterial infection or co-infection with other viruses. If result is  PRESUMPTIVE POSTIVE SARS-CoV-2 nucleic acids MAY BE PRESENT.   A presumptive positive result was obtained on the submitted specimen  and confirmed on repeat testing.  While 2019 novel coronavirus  (SARS-CoV-2) nucleic acids may be present in the submitted sample  additional confirmatory testing may be necessary for epidemiological  and / or clinical management purposes  to differentiate between  SARS-CoV-2 and other Sarbecovirus currently known to infect humans.  If clinically indicated additional testing with an alternate test  methodology [phone]) is advised. The SARS-CoV-2 RNA is generally  detectable in upper and lower respiratory sp ecimens during the acute  phase of infection. The expected result is Negative. Fact Sheet for Patients:  StrictlyIdeas.no Fact Sheet for Healthcare Providers: BankingDealers.co.za This test is not yet approved or cleared by the [place] FDA and has been authorized for detection and/or diagnosis of SARS-CoV-2 by FDA under an Emergency Use Authorization (EUA).  This EUA will remain in effect (meaning this test can be used) for the duration of the COVID-19 declaration under Section 564(b)(1) of the Act, 21 U.S.C. section 360bbb-3(b)(1), unless the authorization is terminated or revoked sooner. Performed at [facility], [address] [place], [place] [address]   MRSA PCR Screening     Status: None   Collection Time: 01/17/19  4:44 AM   Specimen: Nasal Mucosa; Nasopharyngeal  Result Value Ref Range Status   MRSA by PCR NEGATIVE NEGATIVE Final    Comment:        The GeneXpert MRSA Assay (FDA approved for NASAL specimens only), is one component of a comprehensive MRSA colonization surveillance program. It is not intended to diagnose MRSA infection nor to guide or monitor treatment for MRSA infections. Performed at [facility], [address], [place], [place] [address]    Urine Culture     Status: Abnormal   Collection Time: 01/17/19 11:52 AM   Specimen: Urine, Clean Catch  Result Value Ref Range Status   Specimen Description   Final    URINE, CLEAN CATCH Performed at [facility], [address], [place], [place] [address]    Special Requests   Final    Normal Performed at [facility], [place], [place] [address]    Culture >=100,000 COLONIES/mL ESCHERICHIA COLI (A)  Final   Report Status 01/19/2019 FINAL  Final   Organism ID, Bacteria ESCHERICHIA COLI (A)  Final      Susceptibility   Escherichia coli - MIC*    AMPICILLIN >=32 RESISTANT Resistant     CEFAZOLIN <=4 SENSITIVE Sensitive     CEFTRIAXONE <=1  SENSITIVE Sensitive     CIPROFLOXACIN >=4 RESISTANT Resistant     GENTAMICIN <=1 SENSITIVE Sensitive     IMIPENEM <=0.25 SENSITIVE Sensitive     NITROFURANTOIN <=16 SENSITIVE Sensitive     TRIMETH/SULFA <=20 SENSITIVE Sensitive     AMPICILLIN/SULBACTAM 8 SENSITIVE Sensitive     PIP/TAZO <=4 SENSITIVE Sensitive     Extended ESBL NEGATIVE Sensitive     * >=100,000 COLONIES/mL ESCHERICHIA COLI    Coagulation Studies: No results for input(s): LABPROT, INR in the last 72 hours.  Urinalysis: No results for input(s): COLORURINE, LABSPEC, PHURINE, GLUCOSEU, HGBUR, BILIRUBINUR, KETONESUR, PROTEINUR, UROBILINOGEN, NITRITE, LEUKOCYTESUR in the last 72 hours.  Invalid input(s): APPERANCEUR    Imaging: Dg Chest Port 1 View  Result Date: 01/20/2019 CLINICAL DATA:  68 year old female with a history of pulmonary disease EXAM: PORTABLE CHEST 1 VIEW COMPARISON:  01/17/2019 12/13/2018, 07/26/2016 FINDINGS: Cardiomediastinal silhouette unchanged in size and contour. No pneumothorax.  No pleural effusion. Reticulonodular opacities of the lungs, similar pattern to the most recent plain film studies of 01/17/2019 and 12/13/2018. There has been progression from the prior plain film of 2018. No acute displaced fracture  IMPRESSION: Similar appearance of reticulonodular lung opacities to the plain film dated 01/17/2019, 12/13/2018 though progressed from 07/26/2016. this may reflect progression of chronic fibrosis/scarring, however, superimposed multifocal infection difficult to exclude. Electronically Signed   By: Corrie Mckusick D.O.   On: 01/20/2019 16:00     Medications:    Current Facility-Administered Medications (Endocrine & Metabolic):  .  hydrocortisone sodium succinate (SOLU-CORTEF) 100 MG injection 100 mg .  insulin aspart (novoLOG) injection 0-20 Units .  insulin aspart (novoLOG) injection 0-5 Units .  insulin glargine (LANTUS) injection 10 Units .  levothyroxine (SYNTHROID) tablet 50 mcg   Current Facility-Administered Medications (Cardiovascular):  .  atorvastatin (LIPITOR) tablet 40 mg .  furosemide (LASIX) 250 mg in dextrose 5 % 250 mL (1 mg/mL) infusion .  milrinone (PRIMACOR) 20 MG/100 ML (0.2 mg/mL) infusion     Current Facility-Administered Medications (Analgesics):  .  acetaminophen (TYLENOL) tablet 650 mg .  HYDROcodone-acetaminophen (NORCO/VICODIN) 5-325 MG per tablet 1-2 tablet   Current Facility-Administered Medications (Hematological):  .  albumin human 25 % solution 12.5 g   Current Facility-Administered Medications (Other):  .  0.9 %  sodium chloride infusion .  Chlorhexidine Gluconate Cloth 2 % PADS 6 each .  ondansetron (ZOFRAN) injection 4 mg .  pantoprazole (PROTONIX) injection 40 mg .  polyethylene glycol (MIRALAX / GLYCOLAX) packet 17 g .  potassium chloride (KLOR-CON) packet 40 mEq .  sodium chloride flush (NS) 0.9 % injection 3 mL .  sodium chloride flush (NS) 0.9 % injection 3 mL  No current outpatient medications on file.    Assessment/ Plan:  68 y.o. female with  medical problems of chronic systolic congestive heart failure, coronary disease, atrial fibrillation requiring anticoagulation, diabetes, anemia, carotid artery disease with history of left  carotid endarterectomy, previous stroke, hypertension, chronic kidney disease, was admitted on 01/17/2019 with shortness of breath worsening edema, acute kidney injury.   1.  Acute kidney injury 2.  Chronic kidney disease stage III 3.  Shortness of breath 4.  Generalized edema 5.  Severe anemia 6.  Diabetes, insulin-dependent.  Hemoglobin A1c 7.4% December 14, 2018 7.  Hematuria 8.  Proteinuria 9.  Severe chronic systolic congestive heart failure LVEF 25 to 30% 10.  Pulmonary hypertension 11.  Atrial fibrillation requiring anticoagulation 12.  Hyponatremia 13. UTI - E Coli 01/17/2019  Acute kidney injury is likely secondary to cardiorenal syndrome.  Patient has relative hypotension which may be leading to renal hypoperfusion.  She has underlying chronic kidney disease secondary to diabetes and hypertension.  Patient is also noted to have severe chronic congestive heart failure and hypoalbuminemia.  Recommend: -Patient continues to have excellent diuretic response.  Urine output was 6.4 L over the preceding 24 hours.  Therefore we will maintain the patient on furosemide drip at this time.  She is also on milrinone drip.  Renal function has remained relatively stable despite diuresis.  Continue to monitor renal parameters daily for now.  LOS: 5 Chaden Doom 10/14/20201:57 PM  [place], [place]  Note: This note was prepared with Dragon dictation. Any transcription errors are unintentional

## 2019-01-22 NOTE — Progress Notes (Signed)
Inpatient Diabetes Program Recommendations  AACE/ADA: New Consensus Statement on Inpatient Glycemic Control   Target Ranges:  Prepandial:   less than 140 mg/dL      Peak postprandial:   less than 180 mg/dL (1-2 hours)      Critically ill patients:  140 - 180 mg/dL   Results for Meghan [name], Meghan [name] (MRN [id number]) as of 01/22/2019 08:19  Ref. Range 01/21/2019 07:31 01/21/2019 11:22 01/21/2019 16:50 01/21/2019 19:54 01/21/2019 22:23 01/22/2019 00:02 01/22/2019 04:06 01/22/2019 07:16  Glucose-Capillary Latest Ref Range: 70 - 99 mg/dL 152 (H) 232 (H) 239 (H) 294 (H) 329 (H) 354 (H) 347 (H) 317 (H)   Review of Glycemic Control  Current orders for Inpatient glycemic control: Novolog 0-9 units TID with meals, Novolog 0-5 units QHS; Solucortef 100 mg Q12H  Inpatient Diabetes Program Recommendations:   Insulin-Basal: If steroids are continued as ordered, please consider ordering Lantus 10 units Q24H. If Lantus is ordered, please keep in mind that it will need to be tapered as steroids are tapered.  Thanks, Barnie Alderman, RN, MSN, CDE Diabetes Coordinator Inpatient Diabetes Program [phone] (Team Pager from 8am to 5pm)

## 2019-01-22 NOTE — Progress Notes (Addendum)
Progress Note  Patient Name: Meghan [name] Date of Encounter: 01/22/2019  Primary Cardiologist: Fletcher Anon  Subjective   She reports improved SOB. No chest pain or palpitations. Changed to Lasix gtt on 10/12 with continuation of milrinone infusion. Remains massively volume up. Documented UOP of 4.8 L for the past 24 hours with a net - 10.3 L for the admission. Documented weight over the past 24 hours 139.5-->133.8 kg with a baseline weight around 104 kg. Renal function continues to improve. Potassium 3.4. HGB low, though stable at 7.4.  Inpatient Medications    Scheduled Meds: . atorvastatin  40 mg Oral Daily  . Chlorhexidine Gluconate Cloth  6 each Topical Q0600  . hydrocortisone sod succinate (SOLU-CORTEF) inj  100 mg Intravenous Q12H  . insulin aspart  0-5 Units Subcutaneous QHS  . insulin aspart  0-9 Units Subcutaneous TID WC  . levothyroxine  50 mcg Oral QAC breakfast  . pantoprazole  40 mg Intravenous Q12H  . sodium chloride flush  3 mL Intravenous Q12H   Continuous Infusions: . sodium chloride Stopped (01/17/19 1010)  . albumin human Stopped (01/21/19 1220)  . furosemide (LASIX) infusion 8 mg/hr (01/22/19 0600)  . milrinone 0.125 mcg/kg/min (01/22/19 0600)  . potassium chloride     PRN Meds: sodium chloride, acetaminophen, HYDROcodone-acetaminophen, ondansetron (ZOFRAN) IV, polyethylene glycol, sodium chloride flush   Vital Signs    Vitals:   01/22/19 0300 01/22/19 0400 01/22/19 0500 01/22/19 0600  BP: 109/71 114/73 107/69 (!) 141/81  Pulse: (!) 108 (!) 107 (!) 116 (!) 117  Resp: 12  14 13   Temp:      TempSrc:      SpO2: 96% 96% 95% 96%  Weight:   133.8 kg   Height:        Intake/Output Summary (Last 24 hours) at 01/22/2019 0827 Last data filed at 01/22/2019 0530 Gross per 24 hour  Intake 1657.37 ml  Output 6450 ml  Net -4792.63 ml   Filed Weights   01/19/19 0500 01/21/19 0400 01/22/19 0500  Weight: (!) 138.9 kg (!) 139.5 kg 133.8 kg    Telemetry     Afib/flutter with RVR, 110s bpm, occasional PVCs - Personally Reviewed  ECG    No new tracings - Personally Reviewed  Physical Exam   GEN: No acute distress.   Neck: JVD difficult to assess secondary to body habitus. Cardiac: Tachycardic, irregular, no murmurs, rubs, or gallops.  Respiratory: Diminished breath sounds bilaterally with crackles along the bilateral bases on anterior exam.  GI: Soft, nontender, non-distended.   MS: 2+ lower extremity pitting edema; No deformity. Neuro:  Alert and oriented x 3; Nonfocal.  Psych: Normal affect.  Labs    Chemistry Recent Labs  Lab 01/17/19 0011 01/17/19 1305  01/20/19 0510 01/21/19 0457 01/22/19 0546  NA 133*  --    < > 133* 134* 133*  K 5.0  --    < > 3.8 3.5 3.4*  CL 100  --    < > 100 97* 94*  CO2 24  --    < > 26 28 29   GLUCOSE 119*  --    < > 164* 166* 342*  BUN 74*  --    < > 61* 53* 52*  CREATININE 3.82*  --    < > 2.86* 2.48* 2.15*  CALCIUM 7.7*  --    < > 7.8* 7.8* 7.8*  PROT 7.7 7.1  --   --   --   --   ALBUMIN  2.5* 2.2*  --   --   --   --   AST 42* 42*  --   --   --   --   ALT 23 23  --   --   --   --   ALKPHOS 103 79  --   --   --   --   BILITOT 1.3* 1.2  --   --   --   --   GFRNONAA 11*  --    < > 16* 19* 23*  GFRAA 13*  --    < > 19* 22* 26*  ANIONGAP 9  --    < > 7 9 10    < > = values in this interval not displayed.     Hematology Recent Labs  Lab 01/20/19 0510 01/20/19 1313 01/21/19 0457 01/22/19 0546  WBC 5.4  --  5.2 5.9  RBC 2.70*  --  2.89* 2.80*  HGB 6.9* 7.5* 7.6* 7.4*  HCT 22.8* 23.8* 24.4* 23.6*  MCV 84.4  --  84.4 84.3  MCH 25.6*  --  26.3 26.4  MCHC 30.3  --  31.1 31.4  RDW 18.8*  --  19.0* 19.3*  PLT 195  --  206 188    Cardiac EnzymesNo results for input(s): TROPONINI in the last 168 hours. No results for input(s): TROPIPOC in the last 168 hours.   BNP Recent Labs  Lab 01/17/19 0011  BNP 790.0*     DDimer No results for input(s): DDIMER in the last 168 hours.    Radiology    Dg Chest Port 1 View  Result Date: 01/20/2019 IMPRESSION: Similar appearance of reticulonodular lung opacities to the plain film dated 01/17/2019, 12/13/2018 though progressed from 07/26/2016. this may reflect progression of chronic fibrosis/scarring, however, superimposed multifocal infection difficult to exclude. Electronically Signed   By: Corrie Mckusick D.O.   On: 01/20/2019 16:00    Cardiac Studies   2D echo 12/2018: 1. The left ventricle has severely reduced systolic function, with an ejection fraction of 25-30%. The cavity size was mildly dilated. Left ventricular diastolic Doppler parameters are indeterminate. Left ventricular diffuse hypokinesis. 2. The right ventricle has mildly reduced systolic function. The cavity was moderately enlarged. There is no increase in right ventricular wall thickness. Right ventricular systolic pressure is moderately elevated with an estimated pressure of 56.2  mmHg. 3. Left atrial size was severely dilated. 4. Right atrial size was moderately dilated. 5. Tricuspid valve regurgitation is moderate. 6. Rhythm is atrial fibrillation  Patient Profile     68 y.o. female with history of CAD as detailed below, HFrEF secondary to ICM with prior hyperkalemia with spironolactone in the setting of renal failure, persistentA. fibs/p recent DCCV on 01/03/2019 that was briefly successful on amiodarone and Coumadin, CVA, anemia, carotid artery disease s/p left-sided CEA, DM, HTN, HLDwho is being seen today for the evaluation of CHF.  Assessment & Plan    1. Acute on chronic HFrEF secondary to ICM with likely cardiorenal syndrome: -She remains massively volume overloaded, though is improving -Continue IV Lasix gtt at 8 mg/hr for now (was receiving Lasix IV 40 mg q 8 hours as of 10/11) -Continue milrinone gtt at current dose, look to taper off in the next 24 hours if BP remains consistent with reading noted this morning -She will require  extensive inpatient IV diuresis, days to weeks -Beta blocker precluded in the setting of low output heart failure -Renal dysfunction precludes ACEi/ARB/spironolactone/Entresto   2. Persistent Afib/flutter  with RVR: -Ventricular rates are mildly tachycardic this morning, likely exacerbated by her anemia -Recommend transfusion as below -Beta blocker therapy has been precluded in the setting of low output heart failure  -Unable to use digoxin in the setting of renal dysfunction  -Not a candidate for CCB in the setting of cardiomyopathy -Would avoid IV amiodarone given anticoagulation has been stopped in the setting of her anemia  -Anticoagulation on hold given bleed/anemia -Amiodarone stopped given she is no longer on anticoagulation in an effort to reduce risk of pharmacological conversion  3. GI bleed/acute blood loss anemia: -HGB remains low, though stable -Recommend transfuse to goal HGB > 9 -Endoscopy has been deferred in the setting of the above  4. Acute on CKD stage III: -Continues to improve with milrinone and IV diuresis  -Monitor  5. Lower extremity edema: -Likely multifactorial including anemia, hypoalbuminemia  For questions or updates, please contact [place] Please consult [email or web address] for contact info under Cardiology/STEMI.    Signed, Christell Faith, PA-C [facility] Pager: [phone] 01/22/2019, 8:27 AM

## 2019-01-23 ENCOUNTER — Ambulatory Visit: Payer: Medicare Other | Admitting: Nurse Practitioner

## 2019-01-23 DIAGNOSIS — J984 Other disorders of lung: Secondary | ICD-10-CM

## 2019-01-23 DIAGNOSIS — D649 Anemia, unspecified: Secondary | ICD-10-CM | POA: Diagnosis not present

## 2019-01-23 DIAGNOSIS — N179 Acute kidney failure, unspecified: Secondary | ICD-10-CM | POA: Diagnosis not present

## 2019-01-23 DIAGNOSIS — N049 Nephrotic syndrome with unspecified morphologic changes: Secondary | ICD-10-CM | POA: Diagnosis not present

## 2019-01-23 DIAGNOSIS — I5023 Acute on chronic systolic (congestive) heart failure: Secondary | ICD-10-CM | POA: Diagnosis not present

## 2019-01-23 LAB — GLUCOSE, CAPILLARY
Glucose-Capillary: 128 mg/dL — ABNORMAL HIGH (ref 70–99)
Glucose-Capillary: 133 mg/dL — ABNORMAL HIGH (ref 70–99)
Glucose-Capillary: 133 mg/dL — ABNORMAL HIGH (ref 70–99)
Glucose-Capillary: 133 mg/dL — ABNORMAL HIGH (ref 70–99)
Glucose-Capillary: 136 mg/dL — ABNORMAL HIGH (ref 70–99)
Glucose-Capillary: 139 mg/dL — ABNORMAL HIGH (ref 70–99)
Glucose-Capillary: 140 mg/dL — ABNORMAL HIGH (ref 70–99)
Glucose-Capillary: 147 mg/dL — ABNORMAL HIGH (ref 70–99)
Glucose-Capillary: 155 mg/dL — ABNORMAL HIGH (ref 70–99)
Glucose-Capillary: 160 mg/dL — ABNORMAL HIGH (ref 70–99)
Glucose-Capillary: 171 mg/dL — ABNORMAL HIGH (ref 70–99)
Glucose-Capillary: 254 mg/dL — ABNORMAL HIGH (ref 70–99)
Glucose-Capillary: 263 mg/dL — ABNORMAL HIGH (ref 70–99)
Glucose-Capillary: 304 mg/dL — ABNORMAL HIGH (ref 70–99)
Glucose-Capillary: 338 mg/dL — ABNORMAL HIGH (ref 70–99)

## 2019-01-23 LAB — CBC WITH DIFFERENTIAL/PLATELET
Abs Immature Granulocytes: 0.23 10*3/uL — ABNORMAL HIGH (ref 0.00–0.07)
Basophils Absolute: 0 10*3/uL (ref 0.0–0.1)
Basophils Relative: 0 %
Eosinophils Absolute: 0.1 10*3/uL (ref 0.0–0.5)
Eosinophils Relative: 1 %
HCT: 25.5 % — ABNORMAL LOW (ref 36.0–46.0)
Hemoglobin: 8 g/dL — ABNORMAL LOW (ref 12.0–15.0)
Immature Granulocytes: 2 %
Lymphocytes Relative: 5 %
Lymphs Abs: 0.5 10*3/uL — ABNORMAL LOW (ref 0.7–4.0)
MCH: 26.3 pg (ref 26.0–34.0)
MCHC: 31.4 g/dL (ref 30.0–36.0)
MCV: 83.9 fL (ref 80.0–100.0)
Monocytes Absolute: 0.6 10*3/uL (ref 0.1–1.0)
Monocytes Relative: 6 %
Neutro Abs: 8.7 10*3/uL — ABNORMAL HIGH (ref 1.7–7.7)
Neutrophils Relative %: 86 %
Platelets: 208 10*3/uL (ref 150–400)
RBC: 3.04 MIL/uL — ABNORMAL LOW (ref 3.87–5.11)
RDW: 19.6 % — ABNORMAL HIGH (ref 11.5–15.5)
WBC: 10.1 10*3/uL (ref 4.0–10.5)
nRBC: 0.2 % (ref 0.0–0.2)

## 2019-01-23 LAB — BASIC METABOLIC PANEL
Anion gap: 11 (ref 5–15)
BUN: 47 mg/dL — ABNORMAL HIGH (ref 8–23)
CO2: 31 mmol/L (ref 22–32)
Calcium: 8.4 mg/dL — ABNORMAL LOW (ref 8.9–10.3)
Chloride: 91 mmol/L — ABNORMAL LOW (ref 98–111)
Creatinine, Ser: 1.75 mg/dL — ABNORMAL HIGH (ref 0.44–1.00)
GFR calc Af Amer: 34 mL/min — ABNORMAL LOW (ref >60)
GFR calc non Af Amer: 29 mL/min — ABNORMAL LOW (ref >60)
Glucose, Bld: 132 mg/dL — ABNORMAL HIGH (ref 70–99)
Potassium: 3.3 mmol/L — ABNORMAL LOW (ref 3.5–5.1)
Sodium: 133 mmol/L — ABNORMAL LOW (ref 135–145)

## 2019-01-23 LAB — MAGNESIUM: Magnesium: 2 mg/dL (ref 1.7–2.4)

## 2019-01-23 MED ORDER — HYDROCORTISONE NA SUCCINATE PF 100 MG IJ SOLR
50.0000 mg | Freq: Two times a day (BID) | INTRAMUSCULAR | Status: DC
  Administered 2019-01-23 – 2019-01-24 (×2): 50 mg via INTRAVENOUS
  Filled 2019-01-23 (×2): qty 2

## 2019-01-23 MED ORDER — INSULIN ASPART PROT & ASPART (70-30 MIX) 100 UNIT/ML ~~LOC~~ SUSP
8.0000 [IU] | Freq: Three times a day (TID) | SUBCUTANEOUS | Status: DC
  Administered 2019-01-23 – 2019-01-27 (×11): 8 [IU] via SUBCUTANEOUS
  Filled 2019-01-23 (×11): qty 10

## 2019-01-23 MED ORDER — INSULIN GLARGINE 100 UNIT/ML ~~LOC~~ SOLN
12.0000 [IU] | Freq: Every day | SUBCUTANEOUS | Status: DC
  Administered 2019-01-23 – 2019-01-26 (×4): 12 [IU] via SUBCUTANEOUS
  Filled 2019-01-23 (×6): qty 0.12

## 2019-01-23 MED ORDER — FUROSEMIDE 10 MG/ML IJ SOLN
40.0000 mg | Freq: Two times a day (BID) | INTRAMUSCULAR | Status: DC
  Administered 2019-01-23 – 2019-01-24 (×4): 40 mg via INTRAVENOUS
  Filled 2019-01-23 (×4): qty 4

## 2019-01-23 MED ORDER — POTASSIUM CHLORIDE 10 MEQ/100ML IV SOLN
10.0000 meq | INTRAVENOUS | Status: AC
  Administered 2019-01-23 (×2): 10 meq via INTRAVENOUS
  Filled 2019-01-23 (×5): qty 100

## 2019-01-23 MED ORDER — INSULIN ASPART 100 UNIT/ML ~~LOC~~ SOLN
0.0000 [IU] | SUBCUTANEOUS | Status: DC
  Administered 2019-01-23: 8 [IU] via SUBCUTANEOUS
  Administered 2019-01-23 (×2): 3 [IU] via SUBCUTANEOUS
  Administered 2019-01-23: 11 [IU] via SUBCUTANEOUS
  Administered 2019-01-24 (×2): 3 [IU] via SUBCUTANEOUS
  Administered 2019-01-24: 12:00:00 8 [IU] via SUBCUTANEOUS
  Administered 2019-01-24: 5 [IU] via SUBCUTANEOUS
  Administered 2019-01-24: 2 [IU] via SUBCUTANEOUS
  Administered 2019-01-24: 5 [IU] via SUBCUTANEOUS
  Administered 2019-01-25: 3 [IU] via SUBCUTANEOUS
  Administered 2019-01-25: 2 [IU] via SUBCUTANEOUS
  Filled 2019-01-23 (×12): qty 1

## 2019-01-23 NOTE — Progress Notes (Signed)
Occupational Therapy Treatment Patient Details Name: Meghan [name] MRN: [id number] DOB: 10-30-1950 Today's Date: 01/23/2019    History of present illness From MD H&P: Pt is a 68 y.o. female with pertinent past medical history of chronic systolic heart failure CAD, persistent atrial fibrillation s/p cardioversion on Coumadin, diabetes mellitus, chronic anemia, CKD, hyperlipidemia, carotid artery disease s/p left carotid endarterectomy with previous CVA, and hypertension into the ED with chief complaints of right flank pain, shortness of breath and rectal bleed.  MD assessment includes: Progressive cardio-renal syndrome in setting of morbid obesity, systolic cardiac failure with EF 25%, GI bleed, AKI/renal failure, and severe hypoglycemia.   OT comments  Pt seen for OT tx this date. Daughter present. Pt just got done working with PT and up in recliner. Pt denies pain. Pt/dtr instructed in energy conservation strategies with handout provided to support recall and carryover. Pt politely denies HHOT needs despite education. Pt continues to benefit from skilled OT services and may benefit from [facility] services should pt change her mind.   Follow Up Recommendations  Home health OT;Supervision/Assistance - 24 hour    Equipment Recommendations  Other (comment)(LB ADL equip)    Recommendations for Other Services      Precautions / Restrictions Precautions Precautions: Fall Precaution Comments: Keep O2 >/= 92% Restrictions Weight Bearing Restrictions: No       Mobility Bed Mobility  Transfers    Balance Overall balance assessment: Needs assistance Sitting-balance support: No upper extremity supported;Feet supported Sitting balance-Leahy Scale: Good Sitting balance - Comments: steady sitting reaching within BOS   Standing balance support: Bilateral upper extremity supported Standing balance-Leahy Scale: Poor Standing balance comment: pt requiring B UE support for static standing balance                            ADL either performed or assessed with clinical judgement   ADL Overall ADL's : Needs assistance/impaired                                             Vision       Perception     Praxis      Cognition Arousal/Alertness: Awake/alert Behavior During Therapy: WFL for tasks assessed/performed Overall Cognitive Status: Within Functional Limits for tasks assessed                                          Exercises Other Exercises Other Exercises: Pt/dtr instructed in energy conservation strategies including a handout to support recall and carryover   Shoulder Instructions       General Comments L>R LE edema.  Firm "bump" noted L ankle (nurse notified and came to assess)--pt and pt's daughter report it is new    Pertinent Vitals/ Pain       Pain Assessment: No/denies pain Pain Score: (0/10 at rest; grimacing with R LE ex's in bed)) Pain Location: R hip pain with LE ex's (none at rest) Pain Descriptors / Indicators: Sore Pain Intervention(s): Limited activity within patient's tolerance;Monitored during session;Repositioned  Home Living  Prior Functioning/Environment              Frequency  Min 2X/week        Progress Toward Goals  OT Goals(current goals can now be found in the care plan section)  Progress towards OT goals: Progressing toward goals  Acute Rehab OT Goals Patient Stated Goal: To return to PLOF OT Goal Formulation: With patient/family Time For Goal Achievement: 02/01/19 Potential to Achieve Goals: Good  Plan Discharge plan remains appropriate;Frequency remains appropriate    Co-evaluation                 AM-PAC OT "6 Clicks" Daily Activity     Outcome Measure   Help from another person eating meals?: A Little Help from another person taking care of personal grooming?: A Little Help from another person  toileting, which includes using toliet, bedpan, or urinal?: A Lot Help from another person bathing (including washing, rinsing, drying)?: A Lot Help from another person to put on and taking off regular upper body clothing?: A Little Help from another person to put on and taking off regular lower body clothing?: A Lot 6 Click Score: 15    End of Session    OT Visit Diagnosis: Other abnormalities of gait and mobility (R26.89);Unsteadiness on feet (R26.81);Muscle weakness (generalized) (M62.81)   Activity Tolerance Patient tolerated treatment well   Patient Left in chair;with call bell/phone within reach;with chair alarm set;with family/visitor present   Nurse Communication          Time: [id number] OT Time Calculation (min): 16 min  Charges: OT General Charges $OT Visit: 1 Visit OT Treatments $Self Care/Home Management : 8-22 mins  Jeni Salles, MPH, MS, OTR/L ascom [phone] 01/23/19, 4:29 PM

## 2019-01-23 NOTE — Progress Notes (Signed)
[facility], [place] 01/23/19  Subjective:  Patient continues to diurese quite well. Urine output was 5.1 L yesterday and she was net -4.4 L. Feeling better.   Objective:  Vital signs in last 24 hours:  Temp:  [97.4 F (36.3 C)-97.8 F (36.6 C)] 97.4 F (36.3 C) (10/15 1137) Pulse Rate:  [90-117] 117 (10/15 1137) Resp:  [13-23] 20 (10/15 1137) BP: (92-155)/(63-126) 121/91 (10/15 1137) SpO2:  [92 %-99 %] 97 % (10/15 1137) Weight:  [132.5 kg] 132.5 kg (10/15 0500)  Weight change: -1.3 kg Filed Weights   01/21/19 0400 01/22/19 0500 01/23/19 0500  Weight: (!) 139.5 kg 133.8 kg 132.5 kg    Intake/Output:    Intake/Output Summary (Last 24 hours) at 01/23/2019 1404 Last data filed at 01/23/2019 1037 Gross per 24 hour  Intake 627.12 ml  Output 5025 ml  Net -4397.88 ml    Physical Exam: General:  No acute distress, obese female, laying in the bed  HEENT  moist oral mucous membranes  Neck:  Supple  Lungs:  mild crackles at bases  Heart::  S1S2 no rubs  Abdomen:  Soft, nontender  Extremities:  2+ pitting edema  Neurologic:  Alert, able to answer questions  Skin:  Warm, dry  Foley in place    Basic Metabolic Panel:  Recent Labs  Lab 01/18/19 0654 01/19/19 0526 01/20/19 0510 01/21/19 0457 01/22/19 0546 01/23/19 0523  NA 136 136 133* 134* 133* 133*  K 4.6 4.0 3.8 3.5 3.4* 3.3*  CL 101 101 100 97* 94* 91*  CO2 24 25 26 28 29 31   GLUCOSE 70 158* 164* 166* 342* 132*  BUN 71* 72* 61* 53* 52* 47*  CREATININE 3.49* 3.25* 2.86* 2.48* 2.15* 1.75*  CALCIUM 7.8* 7.7* 7.8* 7.8* 7.8* 8.4*  MG 2.3  --   --  2.2  --  2.0  PHOS 5.2*  --   --  3.3  --   --      CBC: Recent Labs  Lab 01/17/19 0011  01/19/19 2053 01/20/19 0510 01/20/19 1313 01/21/19 0457 01/22/19 0546 01/23/19 0523  WBC 6.6   < > 5.5 5.4  --  5.2 5.9 10.1  NEUTROABS 4.7  --   --   --   --  3.3  --  8.7*  HGB 6.7*   < > 7.1* 6.9* 7.5* 7.6* 7.4* 8.0*  HCT 21.7*   < >  23.4* 22.8* 23.8* 24.4* 23.6* 25.5*  MCV 81.9   < > 84.8 84.4  --  84.4 84.3 83.9  PLT 193   < > 176 195  --  206 188 208   < > = values in this interval not displayed.     No results found for: HEPBSAG, HEPBSAB, HEPBIGM    Microbiology:  Recent Results (from the past 240 hour(s))  SARS Coronavirus 2 by RT PCR (hospital order, performed in [facility] hospital lab) Nasopharyngeal Nasopharyngeal Swab     Status: None   Collection Time: 01/17/19  1:41 AM   Specimen: Nasopharyngeal Swab  Result Value Ref Range Status   SARS Coronavirus 2 NEGATIVE NEGATIVE Final    Comment: (NOTE) If result is NEGATIVE SARS-CoV-2 target nucleic acids are NOT DETECTED. The SARS-CoV-2 RNA is generally detectable in upper and lower  respiratory specimens during the acute phase of infection. The lowest  concentration of SARS-CoV-2 viral copies this assay can detect is 250  copies / mL. A negative result does not preclude SARS-CoV-2 infection  and should not be used as the sole basis for treatment or other  patient management decisions.  A negative result may occur with  improper specimen collection / handling, submission of specimen other  than nasopharyngeal swab, presence of viral mutation(s) within the  areas targeted by this assay, and inadequate number of viral copies  (<250 copies / mL). A negative result must be combined with clinical  observations, patient history, and epidemiological information. If result is POSITIVE SARS-CoV-2 target nucleic acids are DETECTED. The SARS-CoV-2 RNA is generally detectable in upper and lower  respiratory specimens dur ing the acute phase of infection.  Positive  results are indicative of active infection with SARS-CoV-2.  Clinical  correlation with patient history and other diagnostic information is  necessary to determine patient infection status.  Positive results do  not rule out bacterial infection or co-infection with other viruses. If result is  PRESUMPTIVE POSTIVE SARS-CoV-2 nucleic acids MAY BE PRESENT.   A presumptive positive result was obtained on the submitted specimen  and confirmed on repeat testing.  While 2019 novel coronavirus  (SARS-CoV-2) nucleic acids may be present in the submitted sample  additional confirmatory testing may be necessary for epidemiological  and / or clinical management purposes  to differentiate between  SARS-CoV-2 and other Sarbecovirus currently known to infect humans.  If clinically indicated additional testing with an alternate test  methodology [phone]) is advised. The SARS-CoV-2 RNA is generally  detectable in upper and lower respiratory sp ecimens during the acute  phase of infection. The expected result is Negative. Fact Sheet for Patients:  StrictlyIdeas.no Fact Sheet for Healthcare Providers: BankingDealers.co.za This test is not yet approved or cleared by the [place] FDA and has been authorized for detection and/or diagnosis of SARS-CoV-2 by FDA under an Emergency Use Authorization (EUA).  This EUA will remain in effect (meaning this test can be used) for the duration of the COVID-19 declaration under Section 564(b)(1) of the Act, 21 U.S.C. section 360bbb-3(b)(1), unless the authorization is terminated or revoked sooner. Performed at [facility], [address] [place], [place] [address]   MRSA PCR Screening     Status: None   Collection Time: 01/17/19  4:44 AM   Specimen: Nasal Mucosa; Nasopharyngeal  Result Value Ref Range Status   MRSA by PCR NEGATIVE NEGATIVE Final    Comment:        The GeneXpert MRSA Assay (FDA approved for NASAL specimens only), is one component of a comprehensive MRSA colonization surveillance program. It is not intended to diagnose MRSA infection nor to guide or monitor treatment for MRSA infections. Performed at [facility] [address], [place], [place] [address]    Urine Culture     Status: Abnormal   Collection Time: 01/17/19 11:52 AM   Specimen: Urine, Clean Catch  Result Value Ref Range Status   Specimen Description   Final    URINE, CLEAN CATCH Performed at [facility], [address] [place], [place] [address]    Special Requests   Final    Normal Performed at [facility], [place], [place] [address]    Culture >=100,000 COLONIES/mL ESCHERICHIA COLI (A)  Final   Report Status 01/19/2019 FINAL  Final   Organism ID, Bacteria ESCHERICHIA COLI (A)  Final      Susceptibility   Escherichia coli - MIC*    AMPICILLIN >=32 RESISTANT Resistant     CEFAZOLIN <=4 SENSITIVE Sensitive     CEFTRIAXONE <=  1 SENSITIVE Sensitive     CIPROFLOXACIN >=4 RESISTANT Resistant     GENTAMICIN <=1 SENSITIVE Sensitive     IMIPENEM <=0.25 SENSITIVE Sensitive     NITROFURANTOIN <=16 SENSITIVE Sensitive     TRIMETH/SULFA <=20 SENSITIVE Sensitive     AMPICILLIN/SULBACTAM 8 SENSITIVE Sensitive     PIP/TAZO <=4 SENSITIVE Sensitive     Extended ESBL NEGATIVE Sensitive     * >=100,000 COLONIES/mL ESCHERICHIA COLI    Coagulation Studies: No results for input(s): LABPROT, INR in the last 72 hours.  Urinalysis: No results for input(s): COLORURINE, LABSPEC, PHURINE, GLUCOSEU, HGBUR, BILIRUBINUR, KETONESUR, PROTEINUR, UROBILINOGEN, NITRITE, LEUKOCYTESUR in the last 72 hours.  Invalid input(s): APPERANCEUR    Imaging: No results found.   Medications:    Current Facility-Administered Medications (Endocrine & Metabolic):  .  hydrocortisone sodium succinate (SOLU-CORTEF) 100 MG injection 50 mg .  insulin aspart (novoLOG) injection 0-15 Units .  insulin aspart protamine- aspart (NOVOLOG MIX 70/30) injection 8 Units .  insulin glargine (LANTUS) injection 12 Units .  levothyroxine (SYNTHROID) tablet 50 mcg   Current Facility-Administered Medications (Cardiovascular):  .  atorvastatin (LIPITOR) tablet 40 mg .  furosemide  (LASIX) injection 40 mg     Current Facility-Administered Medications (Analgesics):  .  acetaminophen (TYLENOL) tablet 650 mg .  HYDROcodone-acetaminophen (NORCO/VICODIN) 5-325 MG per tablet 1-2 tablet   Current Facility-Administered Medications (Hematological):  .  albumin human 25 % solution 12.5 g   Current Facility-Administered Medications (Other):  .  0.9 %  sodium chloride infusion .  Chlorhexidine Gluconate Cloth 2 % PADS 6 each .  ondansetron (ZOFRAN) injection 4 mg .  pantoprazole (PROTONIX) injection 40 mg .  polyethylene glycol (MIRALAX / GLYCOLAX) packet 17 g .  sodium chloride flush (NS) 0.9 % injection 3 mL .  sodium chloride flush (NS) 0.9 % injection 3 mL  No current outpatient medications on file.    Assessment/ Plan:  68 y.o. female with  medical problems of chronic systolic congestive heart failure, coronary disease, atrial fibrillation requiring anticoagulation, diabetes, anemia, carotid artery disease with history of left carotid endarterectomy, previous stroke, hypertension, chronic kidney disease, was admitted on 01/17/2019 with shortness of breath worsening edema, acute kidney injury.   1.  Acute kidney injury 2.  Chronic kidney disease stage III 3.  Shortness of breath 4.  Generalized edema 5.  Severe anemia 6.  Diabetes, insulin-dependent.  Hemoglobin A1c 7.4% December 14, 2018 7.  Hematuria 8.  Proteinuria 9.  Severe chronic systolic congestive heart failure LVEF 25 to 30% 10.  Pulmonary hypertension 11.  Atrial fibrillation requiring anticoagulation 12.  Hyponatremia 13. UTI - E Coli 01/17/2019    Acute kidney injury is likely secondary to cardiorenal syndrome.  Patient has relative hypotension which may be leading to renal hypoperfusion.  She has underlying chronic kidney disease secondary to diabetes and hypertension.  Patient is also noted to have severe chronic congestive heart failure and hypoalbuminemia.  Recommend: -Creatinine  actually down to 1.75.  Good urine output at five-point liters over the recent 24 hours.  Patient transition to furosemide 40 mg IV twice daily.  We may need to adjust this up a bit tomorrow if urine output does drop as she was on Lasix drip yesterday.  Serum sodium currently 133.  Continue to monitor serum electrolytes closely..  LOS: 6 Nasirah Sachs 10/15/20202:04 PM  [place], [place]  Note: This note was prepared with Dragon dictation. Any transcription errors are unintentional

## 2019-01-23 NOTE — Progress Notes (Signed)
Patient ID: Meghan [name], female   DOB: 07/11/1950, 68 y.o.   MRN: [id number]  [employer] PROGRESS NOTE  Meghan [name] [id number] DOB: December 13, 1950 DOA: 01/17/2019 PCP: [name], MD  HPI/Subjective: Patient breathing better, some shortness of breath, out of the icu today.  Breathing better.  Objective: Vitals:   01/23/19 1000 01/23/19 1137  BP: 113/83 (!) 121/91  Pulse: (!) 103 (!) 117  Resp: 17 20  Temp:  (!) 97.4 F (36.3 C)  SpO2: 93% 97%    Intake/Output Summary (Last 24 hours) at 01/23/2019 1238 Last data filed at 01/23/2019 1037 Gross per 24 hour  Intake 643.12 ml  Output 5025 ml  Net -4381.88 ml   Filed Weights   01/21/19 0400 01/22/19 0500 01/23/19 0500  Weight: (!) 139.5 kg 133.8 kg 132.5 kg    ROS: Review of Systems  Constitutional: Negative for chills and fever.  Eyes: Negative for blurred vision.  Respiratory: Positive for shortness of breath. Negative for cough.   Cardiovascular: Negative for chest pain.  Gastrointestinal: Negative for abdominal pain, constipation, diarrhea, nausea and vomiting.  Genitourinary: Negative for dysuria.  Musculoskeletal: Negative for joint pain.  Neurological: Negative for dizziness and headaches.   Exam: Physical Exam  Constitutional: She is oriented to person, place, and time.  HENT:  Nose: No mucosal edema.  Mouth/Throat: No oropharyngeal exudate or posterior oropharyngeal edema.  Eyes: Pupils are equal, round, and reactive to light. Conjunctivae, EOM and lids are normal.  Neck: No JVD present. Carotid bruit is not present. No edema present. No thyroid mass and no thyromegaly present.  Cardiovascular: S1 normal and S2 normal. Exam reveals no gallop.  No murmur heard. Pulses:      Dorsalis pedis pulses are 2+ on the right side and 2+ on the left side.  Respiratory: No respiratory distress. She has decreased breath sounds in the right lower field and the left lower field. She has no wheezes. She has no rhonchi.  She has rales in the right lower field and the left lower field.  GI: Soft. Bowel sounds are normal. There is no abdominal tenderness.  Musculoskeletal:     Right ankle: She exhibits swelling.     Left ankle: She exhibits swelling.  Lymphadenopathy:    She has no cervical adenopathy.  Neurological: She is alert and oriented to person, place, and time. No cranial nerve deficit.  Skin: Skin is warm. Nails show no clubbing.  Chronic lower extremity discoloration  Psychiatric: She has a normal mood and affect.      Data Reviewed: Basic Metabolic Panel: Recent Labs  Lab 01/18/19 0654 01/19/19 0526 01/20/19 0510 01/21/19 0457 01/22/19 0546 01/23/19 0523  NA 136 136 133* 134* 133* 133*  K 4.6 4.0 3.8 3.5 3.4* 3.3*  CL 101 101 100 97* 94* 91*  CO2 24 25 26 28 29 31   GLUCOSE 70 158* 164* 166* 342* 132*  BUN 71* 72* 61* 53* 52* 47*  CREATININE 3.49* 3.25* 2.86* 2.48* 2.15* 1.75*  CALCIUM 7.8* 7.7* 7.8* 7.8* 7.8* 8.4*  MG 2.3  --   --  2.2  --  2.0  PHOS 5.2*  --   --  3.3  --   --    Liver Function Tests: Recent Labs  Lab 01/17/19 0011 01/17/19 1305  AST 42* 42*  ALT 23 23  ALKPHOS 103 79  BILITOT 1.3* 1.2  PROT 7.7 7.1  ALBUMIN 2.5* 2.2*   Recent Labs  Lab 01/17/19 1305  LIPASE 15  AMYLASE 30   No results for input(s): AMMONIA in the last 168 hours. CBC: Recent Labs  Lab 01/17/19 0011  01/19/19 2053 01/20/19 0510 01/20/19 1313 01/21/19 0457 01/22/19 0546 01/23/19 0523  WBC 6.6   < > 5.5 5.4  --  5.2 5.9 10.1  NEUTROABS 4.7  --   --   --   --  3.3  --  8.7*  HGB 6.7*   < > 7.1* 6.9* 7.5* 7.6* 7.4* 8.0*  HCT 21.7*   < > 23.4* 22.8* 23.8* 24.4* 23.6* 25.5*  MCV 81.9   < > 84.8 84.4  --  84.4 84.3 83.9  PLT 193   < > 176 195  --  206 188 208   < > = values in this interval not displayed.   BNP (last 3 results) Recent Labs    12/13/18 1136 01/17/19 0011  BNP 1,269.0* 790.0*     CBG: Recent Labs  Lab 01/23/19 0616 01/23/19 0733 01/23/19 0837  01/23/19 0947 01/23/19 1141  GLUCAP 136* 139* 155* 171* 160*    Recent Results (from the past 240 hour(s))  SARS Coronavirus 2 by RT PCR (hospital order, performed in [facility] hospital lab) Nasopharyngeal Nasopharyngeal Swab     Status: None   Collection Time: 01/17/19  1:41 AM   Specimen: Nasopharyngeal Swab  Result Value Ref Range Status   SARS Coronavirus 2 NEGATIVE NEGATIVE Final    Comment: (NOTE) If result is NEGATIVE SARS-CoV-2 target nucleic acids are NOT DETECTED. The SARS-CoV-2 RNA is generally detectable in upper and lower  respiratory specimens during the acute phase of infection. The lowest  concentration of SARS-CoV-2 viral copies this assay can detect is 250  copies / mL. A negative result does not preclude SARS-CoV-2 infection  and should not be used as the sole basis for treatment or other  patient management decisions.  A negative result may occur with  improper specimen collection / handling, submission of specimen other  than nasopharyngeal swab, presence of viral mutation(s) within the  areas targeted by this assay, and inadequate number of viral copies  (<250 copies / mL). A negative result must be combined with clinical  observations, patient history, and epidemiological information. If result is POSITIVE SARS-CoV-2 target nucleic acids are DETECTED. The SARS-CoV-2 RNA is generally detectable in upper and lower  respiratory specimens dur ing the acute phase of infection.  Positive  results are indicative of active infection with SARS-CoV-2.  Clinical  correlation with patient history and other diagnostic information is  necessary to determine patient infection status.  Positive results do  not rule out bacterial infection or co-infection with other viruses. If result is PRESUMPTIVE POSTIVE SARS-CoV-2 nucleic acids MAY BE PRESENT.   A presumptive positive result was obtained on the submitted specimen  and confirmed on repeat testing.  While 2019 novel  coronavirus  (SARS-CoV-2) nucleic acids may be present in the submitted sample  additional confirmatory testing may be necessary for epidemiological  and / or clinical management purposes  to differentiate between  SARS-CoV-2 and other Sarbecovirus currently known to infect humans.  If clinically indicated additional testing with an alternate test  methodology [phone]) is advised. The SARS-CoV-2 RNA is generally  detectable in upper and lower respiratory sp ecimens during the acute  phase of infection. The expected result is Negative. Fact Sheet for Patients:  StrictlyIdeas.no Fact Sheet for Healthcare Providers: BankingDealers.co.za This test is not yet approved or cleared by the [place] FDA and has  been authorized for detection and/or diagnosis of SARS-CoV-2 by FDA under an Emergency Use Authorization (EUA).  This EUA will remain in effect (meaning this test can be used) for the duration of the COVID-19 declaration under Section 564(b)(1) of the Act, 21 U.S.C. section 360bbb-3(b)(1), unless the authorization is terminated or revoked sooner. Performed at [facility], [address] [place], [place] [address]   MRSA PCR Screening     Status: None   Collection Time: 01/17/19  4:44 AM   Specimen: Nasal Mucosa; Nasopharyngeal  Result Value Ref Range Status   MRSA by PCR NEGATIVE NEGATIVE Final    Comment:        The GeneXpert MRSA Assay (FDA approved for NASAL specimens only), is one component of a comprehensive MRSA colonization surveillance program. It is not intended to diagnose MRSA infection nor to guide or monitor treatment for MRSA infections. Performed at [facility], [address] [place], [place] [address]   Urine Culture     Status: Abnormal   Collection Time: 01/17/19 11:52 AM   Specimen: Urine, Clean Catch  Result Value Ref Range Status   Specimen Description   Final    URINE, CLEAN  CATCH Performed at [facility], [address] [place], [place] [address]    Special Requests   Final    Normal Performed at [facility], [place], [place] [address]    Culture >=100,000 COLONIES/mL ESCHERICHIA COLI (A)  Final   Report Status 01/19/2019 FINAL  Final   Organism ID, Bacteria ESCHERICHIA COLI (A)  Final      Susceptibility   Escherichia coli - MIC*    AMPICILLIN >=32 RESISTANT Resistant     CEFAZOLIN <=4 SENSITIVE Sensitive     CEFTRIAXONE <=1 SENSITIVE Sensitive     CIPROFLOXACIN >=4 RESISTANT Resistant     GENTAMICIN <=1 SENSITIVE Sensitive     IMIPENEM <=0.25 SENSITIVE Sensitive     NITROFURANTOIN <=16 SENSITIVE Sensitive     TRIMETH/SULFA <=20 SENSITIVE Sensitive     AMPICILLIN/SULBACTAM 8 SENSITIVE Sensitive     PIP/TAZO <=4 SENSITIVE Sensitive     Extended ESBL NEGATIVE Sensitive     * >=100,000 COLONIES/mL ESCHERICHIA COLI     Studies: No results found.  Scheduled Meds: . atorvastatin  40 mg Oral Daily  . Chlorhexidine Gluconate Cloth  6 each Topical Q0600  . furosemide  40 mg Intravenous BID  . hydrocortisone sod succinate (SOLU-CORTEF) inj  100 mg Intravenous Q12H  . insulin aspart  0-15 Units Subcutaneous Q4H  . insulin aspart protamine- aspart  8 Units Subcutaneous TID WC  . insulin glargine  12 Units Subcutaneous QHS  . levothyroxine  50 mcg Oral QAC breakfast  . pantoprazole  40 mg Intravenous Q12H  . sodium chloride flush  3 mL Intravenous Q12H   Continuous Infusions: . sodium chloride Stopped (01/17/19 1010)  . albumin human Stopped (01/23/19 1018)  . potassium chloride 10 mEq (01/23/19 1205)    Assessment/Plan:  1. Acute on chronic systolic congestive heart failure.  Patient taken off milrinone and Lasix drip.  Now on Lasix 40 mg IV twice daily.  Holding losartan.  BiPAP nightly. 2. Cardiogenic shock.  Decrease Solu-Cortef to 50 mg twice daily.  Holding other antihypertensive medications. 3. GI  bleed and melena has resolved.  Vitamin K reverse Coumadin.  Endoscopy closer to the time of discharge.  Patient on IV Protonix. 4. Acute blood loss anemia.  Status post multiple transfusions. 5.  Acute kidney injury on chronic kidney disease 3 secondary to cardiorenal syndrome.  Creatinine improving with diuresis. 6. Persistent atrial fibrillation status post cardioversion and atrial fibrillation.  Amiodarone.  Holding anticoagulation 7. Type 2 diabetes mellitus on sliding scale insulin and long-acting insulin. 8. History of CVA on Coumadin 9. Weakness physical therapy evaluation 10. Urinary tract infection treated with 5 days of Rocephin.  Code Status:     Code Status Orders  (From admission, onward)         Start     Ordered   01/17/19 0302  Full code  Continuous     01/17/19 0306        Code Status History    Date Active Date Inactive Code Status Order ID Comments User Context   12/13/2018 1415 12/15/2018 1952 Full Code [id number]  Otila Back, MD ED   03/30/2017 2002 04/01/2017 1940 DNR [id number]  Demetrios Loll, MD Inpatient   03/12/2017 1236 03/12/2017 1720 Full Code [id number]  Wellington Hampshire, MD Inpatient   09/21/2016 2056 09/24/2016 1922 Full Code [id number]  Theodoro Grist, MD Inpatient   07/26/2016 1530 07/28/2016 1812 Full Code [id number]  Theodoro Grist, MD Inpatient   Advance Care Planning Activity     Family Communication: Spoke with daughter at the bedside Disposition Plan: To be determined  Consultants:  Cardiology  Nephrology  Time spent: 28 minutes, case discussed with cardiology  [place]

## 2019-01-23 NOTE — Progress Notes (Signed)
Physical Therapy Treatment Patient Details Name: Meghan [name] MRN: [id number] DOB: [birth year] Today's Date: 01/23/2019    History of Present Illness From MD H&P: Pt is a 68 y.o. female with pertinent past medical history of chronic systolic heart failure CAD, persistent atrial fibrillation s/p cardioversion on Coumadin, diabetes mellitus, chronic anemia, CKD, hyperlipidemia, carotid artery disease s/p left carotid endarterectomy with previous CVA, and hypertension into the ED with chief complaints of right flank pain, shortness of breath and rectal bleed.  MD assessment includes: Progressive cardio-renal syndrome in setting of morbid obesity, systolic cardiac failure with EF 25%, GI bleed, AKI/renal failure, and severe hypoglycemia.    PT Comments    Pt reporting being tired but agreeable to PT session with mild encouragement.  [name] present and supportive to pt.  Tolerated LE ex's in bed fairly well but reporting R hip pain with R LE ex's (increased assist given with R LE which decreased R hip pain).  Pt mod assist semi-supine to sit but able to perform stand step turn (>180 degree turn transfer set-up per pt baseline preference) with B UE support on recliner arms with CGA.  Pt and pt's daughter reports pt does not ambulate at baseline.  Will continue to focus on strengthening and progressive functional mobility per pt tolerance.    Follow Up Recommendations  Home health PT;Supervision for mobility/OOB     Equipment Recommendations  (pt has needed DME at home already)    Recommendations for Other Services       Precautions / Restrictions Precautions Precautions: Fall Precaution Comments: Keep O2 >/= 92% Restrictions Weight Bearing Restrictions: No    Mobility  Bed Mobility Overal bed mobility: Needs Assistance Bed Mobility: Supine to Sit     Supine to sit: Mod assist;HOB elevated     General bed mobility comments: pt able to move B LE's towards edge of bed; mod assist  for trunk semi-supine to sit  Transfers Overall transfer level: Needs assistance Equipment used: None Transfers: Stand Pivot Transfers   Stand pivot transfers: Min guard       General transfer comment: recliner angled towards pt's L side and pt perform stand step turn bed to recliner going towards R side (>180 degree turn) with B UE support on recliner arms  Ambulation/Gait             General Gait Details: Deferred (pt and pt's daughter reporting pt non-ambulatory baseline)   Marine scientist Rankin (Stroke Patients Only)       Balance Overall balance assessment: Needs assistance Sitting-balance support: No upper extremity supported;Feet supported Sitting balance-Leahy Scale: Good Sitting balance - Comments: steady sitting reaching within BOS   Standing balance support: Bilateral upper extremity supported Standing balance-Leahy Scale: Poor Standing balance comment: pt requiring B UE support for static standing balance                            Cognition Arousal/Alertness: Awake/alert Behavior During Therapy: WFL for tasks assessed/performed Overall Cognitive Status: Within Functional Limits for tasks assessed                                        Exercises Total Joint Exercises Ankle Circles/Pumps: AROM;Strengthening;Both;10 reps;Supine Quad Sets: AROM;Strengthening;Both;10 reps;Supine Heel Slides: AAROM;Strengthening;Both;10 reps;Supine Hip  ABduction/ADduction: AAROM;Strengthening;Both;10 reps;Supine    General Comments General comments (skin integrity, edema, etc.): L>R LE edema.  Firm "bump" noted L ankle (nurse notified and came to assess)--pt and pt's daughter report it is new.  Nursing cleared pt for participation in physical therapy and reports pt transferred to [facility] earlier with nursing staff.      Pertinent Vitals/Pain Pain Assessment: No/denies pain Pain Score: (0/10 at rest;  grimacing with R LE ex's in bed)) Pain Location: R hip pain with LE ex's (none at rest) Pain Descriptors / Indicators: Sore Pain Intervention(s): Limited activity within patient's tolerance;Monitored during session;Repositioned  O2 (on 3 L via nasal cannula) stable and WFL throughout treatment session.  HR fluctuating in 90's bpm at rest and increased up to 118 bpm with activity (decreased back to around 90's bpm with sitting rest).    Home Living                      Prior Function            PT Goals (current goals can now be found in the care plan section) Acute Rehab PT Goals Patient Stated Goal: To return to PLOF PT Goal Formulation: With patient Time For Goal Achievement: 01/31/19 Potential to Achieve Goals: Good Progress towards PT goals: Progressing toward goals    Frequency    Min 2X/week      PT Plan      Co-evaluation              AM-PAC PT "6 Clicks" Mobility   Outcome Measure  Help needed turning from your back to your side while in a flat bed without using bedrails?: A Lot Help needed moving from lying on your back to sitting on the side of a flat bed without using bedrails?: A Lot Help needed moving to and from a bed to a chair (including a wheelchair)?: A Little Help needed standing up from a chair using your arms (e.g., wheelchair or bedside chair)?: A Little Help needed to walk in hospital room?: Total Help needed climbing 3-5 steps with a railing? : Total 6 Click Score: 12    End of Session Equipment Utilized During Treatment: Oxygen;Gait belt Activity Tolerance: Patient tolerated treatment well Patient left: in chair;with call bell/phone within reach;with chair alarm set;with family/visitor present;Other (comment)(B heels floating via pillow) Nurse Communication: Mobility status;Precautions;Other (comment)(Firm bump L ankle) PT Visit Diagnosis: Muscle weakness (generalized) (M62.81);Difficulty in walking, not elsewhere classified  (R26.2)     Time: [id number] PT Time Calculation (min) (ACUTE ONLY): 43 min  Charges:  $Therapeutic Exercise: 8-22 mins $Therapeutic Activity: 23-37 mins                    Leitha Bleak, PT 01/23/19, 4:23 PM [phone]

## 2019-01-23 NOTE — Consult Note (Signed)
PHARMACY CONSULT NOTE  Pharmacy Consult for Electrolyte Monitoring and Replacement   Recent Labs: Potassium (mmol/L)  Date Value  01/23/2019 3.3 (L)  07/10/2013 3.6   Magnesium (mg/dL)  Date Value  01/23/2019 2.0  07/10/2013 1.5 (L)   Calcium (mg/dL)  Date Value  01/23/2019 8.4 (L)   Calcium, Total (mg/dL)  Date Value  07/10/2013 8.1 (L)   Albumin (g/dL)  Date Value  01/17/2019 2.2 (L)  06/15/2017 3.5 (L)  07/08/2013 1.7 (L)   Phosphorus (mg/dL)  Date Value  01/21/2019 3.3   Sodium (mmol/L)  Date Value  01/23/2019 133 (L)  12/24/2018 135  07/10/2013 134 (L)   Corrected Ca: 9.8 mg/dL  Assessment: 68 y.o. female with medical problems of chronic systolic congestive heart failure, coronary disease, atrial fibrillation requiring anticoagulation, diabetes, anemia, carotid artery disease with history of left carotid endarterectomy, previous stroke, hypertension, chronic kidney disease, was admitted on10/9/2020with shortness of breath worsening edema, acute kidney injury. This patient was started on a Lasix drip 10/12. Insulin infusion was started on 10/14. Both insulin infusion and lasix drip dropped. Patient currently on Lasix IV 40mg  BID. Patient K+ was 3.4 before administering 40 mEq K+ packets BID x2, and K+ level dropped to 3.3 this AM.   Goal of Therapy: due to afib and other cardiac PMH: Potassium: 4.0-5.1 mmol/L Magnesium: 2.0-2.4 mg/dL All other electrolytes wnl  Plan:   Replace potassium with 10 mEq IV q2h at 152mL/h x2 dose   Recheck BMP, magnesium in am   Sallye Lat, PharmD Candidate 01/23/2019 8:34 AM

## 2019-01-23 NOTE — Progress Notes (Deleted)
Office Visit    Patient Name: Meghan [name] Date of Encounter: 01/23/2019  Primary Care Provider:  [name], MD Primary Cardiologist:  Kathlyn Sacramento, MD  Chief Complaint    ***  Past Medical History    Past Medical History:  Diagnosis Date  . Carotid arterial disease ([place])    a. 02/2012 s/p L CEA.  . CKD stage G3b/A1, GFR 30-44 and albumin creatinine ratio <30 mg/g   . Coronary artery disease    a. 02/2012 Lexi MV: Anterior, anteroseptal, septal, apical infarct with mild peri-infarct ischemia, EF 41%- medically managed due to anemia; b. 03/2017 Cath: LM nl, LAD 99ost/p/m - faint L->L collaterals, RI 20ost, LCX 52m, RCA 20p, 72m-->Med Rx for chronic subtotal LAD dzs. Avoid R Radial access in future.  . 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 Hyperlipidemia   . Hypertension   . Ischemic cardiomyopathy    a. 06/2013 Echo: EF 35-40%, glob HK, mildly dil LA, mild LVH, mild TR, mild to mod MR; b. 07/2016 Echo: EF 25-30%, sev mid-apicalanteroseptal, ant, apical HK, mild MR, sev dil LA, mod dil RA, mod TR, PASP 2mmHg; c. 11/2016 Echo: EF 20-25%, antsept DK, Gr2 DD, mild MR, mod dil LA/RV/RA, mod red RV fxn, mod-sev TR, PASP 40-12mmHg.  . Morbid obesity ([place])   . Normocytic anemia   . Persistent atrial fibrillation (HCC)    a. CHA2DS2VASc = 7-->coumadin; maintaining sinus on amio.  . Stroke [facility]) 2013   Past Surgical History:  Procedure Laterality Date  . AMPUTATION TOE Left 09/22/2016   Procedure: AMPUTATION TOE;  Surgeon: Albertine Patricia, DPM;  Location: [facility] ORS;  Service: Podiatry;  Laterality: Left;  . CARDIOVERSION N/A 07/28/2016   Procedure: CARDIOVERSION;  Surgeon: Wellington Hampshire, MD;  Location: [facility] ORS;  Service: Cardiovascular;  Laterality: N/A;  . CARDIOVERSION N/A 08/07/2016   Procedure: CARDIOVERSION;  Surgeon: Wellington Hampshire, MD;  Location: [facility] ORS;  Service: Cardiovascular;  Laterality: N/A;  . CARDIOVERSION N/A 01/03/2019   Procedure:  CARDIOVERSION;  Surgeon: Minna Merritts, MD;  Location: [facility] ORS;  Service: Cardiovascular;  Laterality: N/A;  . CAROTID ENDARTERECTOMY     [facility]; Dr. Lucky Cowboy  . CHOLECYSTECTOMY    . foot infection Right   . LEG SURGERY     right;infection  . RIGHT/LEFT HEART CATH AND CORONARY ANGIOGRAPHY Bilateral 03/12/2017   Procedure: RIGHT/LEFT HEART CATH AND CORONARY ANGIOGRAPHY;  Surgeon: Wellington Hampshire, MD;  Location: [place] CV LAB;  Service: Cardiovascular;  Laterality: Bilateral;  . TONSILLECTOMY      Allergies  No Known Allergies  History of Present Illness    ***  Home Medications    Prior to Admission medications   Medication Sig Start Date End Date Taking? Authorizing Provider  amiodarone (PACERONE) 200 MG tablet Take 200 mg by mouth 2 (two) times daily.    [provider]  atorvastatin (LIPITOR) 40 MG tablet Take 1 tablet by mouth once daily Patient taking differently: Take 40 mg by mouth daily.  11/25/18   Wellington Hampshire, MD  carvedilol (COREG) 6.25 MG tablet TAKE 1 TABLET BY MOUTH TWICE DAILY WITH A MEAL 01/06/19   Wellington Hampshire, MD  glipiZIDE (GLUCOTROL) 10 MG tablet Take 10 mg by mouth 2 (two) times daily. Dr Honor Junes 08/13/15   [provider]  Insulin Degludec 200 UNIT/ML SOPN Inject 25 Units into the skin daily at 12 noon. Dr Honor Junes (1300) 02/18/18   [provider]  levothyroxine (SYNTHROID) 50 MCG tablet Take 50 mcg by mouth daily before breakfast.    [provider]  losartan (COZAAR) 25 MG tablet Take 1 tablet (25 mg total) by mouth daily. 12/15/18 12/15/19  Bettey Costa, MD  torsemide (DEMADEX) 20 MG tablet Take 2 tablets (40 mg total) by mouth 2 (two) times daily. 01/09/19   Wellington Hampshire, MD  triamcinolone cream (KENALOG) 0.5 % Apply 1 application topically 2 (two) times daily as needed (psoriasis).     [provider]  warfarin (COUMADIN) 5 MG tablet USE AS DIRECTED BY COUMADIN CLINIC FOR 30 DAYS Patient not  taking: Take one 5 mg tablet Sunday, Tuesday, Wednesday, Thursday, and Saturday. And one and one-half tablets 7.5 mg on Monday and Friday 11/25/18   Wellington Hampshire, MD    Review of Systems    ***.  All other systems reviewed and are otherwise negative except as noted above.  Physical Exam    VS:  There were no vitals taken for this visit. , BMI There is no height or weight on file to calculate BMI. GEN: Well nourished, well developed, in no acute distress. HEENT: normal. Neck: Supple, no JVD, carotid bruits, or masses. Cardiac: RRR, no murmurs, rubs, or gallops. No clubbing, cyanosis, edema.  Radials/DP/PT 2+ and equal bilaterally.  Respiratory:  Respirations regular and unlabored, clear to auscultation bilaterally. GI: Soft, nontender, nondistended, BS + x 4. MS: no deformity or atrophy. Skin: warm and dry, no rash. Neuro:  Strength and sensation are intact. Psych: Normal affect.  Accessory Clinical Findings    ECG personally reviewed by me today - *** - no acute changes.  Assessment & Plan    1.  ***   Arvil Chaco, PA-C 01/23/2019, 10:49 AM

## 2019-01-23 NOTE — Progress Notes (Signed)
Progress Note  Patient Name: Meghan [name] Date of Encounter: 01/23/2019  Primary Cardiologist: Fletcher Anon  Subjective   She reports improved SOB. No chest pain or palpitations. Changed toLasix gtt on 10/12 with discontinuation of milrinone infusion on 10/14.Remains massively volume up. Documented UOP of 4.4L for the past 24 hours with a net - 14.8L for the admission. Documented weight over the past 24 hours 139.5-->133.8-->132.5kg with a baseline weight around 104 kg. Renal function continues to improve at 47/1.75 today. Potassium 3.3. HGB low, though slightly improved at 8.0.  Inpatient Medications    Scheduled Meds: . atorvastatin  40 mg Oral Daily  . Chlorhexidine Gluconate Cloth  6 each Topical Q0600  . hydrocortisone sod succinate (SOLU-CORTEF) inj  100 mg Intravenous Q12H  . levothyroxine  50 mcg Oral QAC breakfast  . pantoprazole  40 mg Intravenous Q12H  . sodium chloride flush  3 mL Intravenous Q12H   Continuous Infusions: . sodium chloride Stopped (01/17/19 1010)  . albumin human Stopped (01/22/19 1059)  . furosemide (LASIX) infusion 8 mg/hr (01/23/19 0514)  . insulin 0.6 Units/hr (01/23/19 0617)  . milrinone Stopped (01/22/19 1156)   PRN Meds: sodium chloride, acetaminophen, HYDROcodone-acetaminophen, ondansetron (ZOFRAN) IV, polyethylene glycol, sodium chloride flush   Vital Signs    Vitals:   01/23/19 0200 01/23/19 0300 01/23/19 0400 01/23/19 0500  BP: (!) 116/104 99/75 117/72 118/89  Pulse: (!) 108 94 90 94  Resp: 14 14 13 17   Temp: 97.8 F (36.6 C)     TempSrc: Oral     SpO2: 98% 92% 99% 97%  Weight:    132.5 kg  Height:        Intake/Output Summary (Last 24 hours) at 01/23/2019 0718 Last data filed at 01/23/2019 0515 Gross per 24 hour  Intake 662.97 ml  Output 5125 ml  Net -4462.03 ml   Filed Weights   01/21/19 0400 01/22/19 0500 01/23/19 0500  Weight: (!) 139.5 kg 133.8 kg 132.5 kg    Telemetry    Atrial flutter with variable AV block,  rates 80s to 90s bpm - Personally Reviewed  ECG    No new tracings - Personally Reviewed  Physical Exam   GEN: No acute distress.   Neck: JVD difficult to assess secondary to body habitus. Cardiac: Irregular, no murmurs, rubs, or gallops.  Respiratory: Clear to auscultation bilaterally.  GI: Soft, nontender, non-distended.   MS: 2+ lower extremity pitting edema; No deformity. Neuro:  Alert and oriented x 3; Nonfocal.  Psych: Normal affect.  Labs    Chemistry Recent Labs  Lab 01/17/19 0011 01/17/19 1305  01/21/19 0457 01/22/19 0546 01/23/19 0523  NA 133*  --    < > 134* 133* 133*  K 5.0  --    < > 3.5 3.4* 3.3*  CL 100  --    < > 97* 94* 91*  CO2 24  --    < > 28 29 31   GLUCOSE 119*  --    < > 166* 342* 132*  BUN 74*  --    < > 53* 52* 47*  CREATININE 3.82*  --    < > 2.48* 2.15* 1.75*  CALCIUM 7.7*  --    < > 7.8* 7.8* 8.4*  PROT 7.7 7.1  --   --   --   --   ALBUMIN 2.5* 2.2*  --   --   --   --   AST 42* 42*  --   --   --   --  ALT 23 23  --   --   --   --   ALKPHOS 103 79  --   --   --   --   BILITOT 1.3* 1.2  --   --   --   --   GFRNONAA 11*  --    < > 19* 23* 29*  GFRAA 13*  --    < > 22* 26* 34*  ANIONGAP 9  --    < > 9 10 11    < > = values in this interval not displayed.     Hematology Recent Labs  Lab 01/21/19 0457 01/22/19 0546 01/23/19 0523  WBC 5.2 5.9 10.1  RBC 2.89* 2.80* 3.04*  HGB 7.6* 7.4* 8.0*  HCT 24.4* 23.6* 25.5*  MCV 84.4 84.3 83.9  MCH 26.3 26.4 26.3  MCHC 31.1 31.4 31.4  RDW 19.0* 19.3* 19.6*  PLT 206 188 208    Cardiac EnzymesNo results for input(s): TROPONINI in the last 168 hours. No results for input(s): TROPIPOC in the last 168 hours.   BNP Recent Labs  Lab 01/17/19 0011  BNP 790.0*     DDimer No results for input(s): DDIMER in the last 168 hours.   Radiology    Dg Chest Port 1 View  Result Date: 01/20/2019 IMPRESSION: Similar appearance of reticulonodular lung opacities to the plain film dated 01/17/2019,  12/13/2018 though progressed from 07/26/2016. this may reflect progression of chronic fibrosis/scarring, however, superimposed multifocal infection difficult to exclude. Electronically Signed   By: Corrie Mckusick D.O.   On: 01/20/2019 16:00   Cardiac Studies   2D echo 12/2018: 1. The left ventricle has severely reduced systolic function, with an ejection fraction of 25-30%. The cavity size was mildly dilated. Left ventricular diastolic Doppler parameters are indeterminate. Left ventricular diffuse hypokinesis. 2. The right ventricle has mildly reduced systolic function. The cavity was moderately enlarged. There is no increase in right ventricular wall thickness. Right ventricular systolic pressure is moderately elevated with an estimated pressure of 56.2  mmHg. 3. Left atrial size was severely dilated. 4. Right atrial size was moderately dilated. 5. Tricuspid valve regurgitation is moderate. 6. Rhythm is atrial fibrillation  Patient Profile     68 y.o. female with history of CAD as detailed below, HFrEF secondary to ICM with prior hyperkalemia with spironolactone in the setting of renal failure, persistentA. fibs/p recent DCCV on 01/03/2019 that was briefly successful on amiodarone and Coumadin, CVA, anemia, carotid artery disease s/p left-sided CEA, DM, HTN, HLDwho is being seen today for the evaluation of CHF.  Assessment & Plan    1. Acute on chronic HFrEF secondary to ICM with likely cardiorenal syndrome: -She remains massively volume overloaded, though is improving -ContinueIV Lasix gtt at 8 mg/hr for now (was receiving Lasix IV 40 mg q 8 hours as of 10/11) -Milrinone gtt discontinued on 10/14, monitor BP and renal function closely -She will continued to require extensive inpatient IV diuresis over the next several days -Beta blocker precluded in the setting of low output heart failure -Renal dysfunction precludes ACEi/ARB/spironolactone/Entresto   2. Persistent Afib/flutter  with RVR: -Ventricular rates are mildly tachycardic this morning, likely exacerbated by her anemia -Recommend maintaining HGB > 9 as below -Beta blocker therapy has been precluded in the setting of low output heart failure  -Unable to use digoxin in the setting of renal dysfunction  -Not a candidate for CCB in the setting of cardiomyopathy -Would avoid IV amiodarone given anticoagulation has been stopped in  the setting of her anemia  -Anticoagulation on hold given bleed/anemia -PO amiodaronestoppedgiven she is no longer on anticoagulation in an effort to reduce risk of pharmacological conversion  3. GI bleed/acute blood loss anemia: -HGB remains low, though stable -Recommend transfuse to goal HGB >9 -Endoscopy has been deferred in the setting of the above  4. Acute on CKD stage III: -Continues to improve with milrinone and IV diuresis  -Monitor  5. Lower extremity edema: -Likely multifactorial including anemia, hypoalbuminemia, volume overload   For questions or updates, please contact [place] Please consult [email or web address] for contact info under Cardiology/STEMI.    Signed, Christell Faith, PA-C [facility] Pager: [phone] 01/23/2019, 7:18 AM

## 2019-01-23 NOTE — Progress Notes (Addendum)
CRITICAL CARE PROGRESS NOTE    Name: Johanne Mcquerry MRN: [id number] DOB: Oct 20, 1950     LOS: 6   SUBJECTIVE FINDINGS & SIGNIFICANT EVENTS      BRIEF PATIENT DESCRIPTION:  68 year old female admitted with acute hypoxic respiratory failure secondary to acute on chronic systolic CHF, acute blood loss anemia secondary to GI bleed, AKI on CKD, and right sided flank/back pain.    10/15- pt signfificantly improved, states shes close to baseline.  Off of insulin drip, off of primacor drip, will continue diuresis with bid lasix and transfer to medical floor today  STUDIES:  10/9 - CT Abdomen & Pelvis:1. Mild fat stranding around the pancreas, likely from generalized volume overload but please correlate with serum enzymes. 2. Small volume ascites accumulated around the liver. 3. Cardiomegaly and atherosclerosis.  10/9 - Venous Ultrasound LLE>>negative   CULTURES: SARS-CoV-2 PCR 10/9>> negative  ANTIBIOTICS: Ceftriaxone 10/9>>10/13 stopped rocephin    PAST MEDICAL HISTORY   Past Medical History:  Diagnosis Date  . Carotid arterial disease ([place])    a. 02/2012 s/p L CEA.  . CKD stage G3b/A1, GFR 30-44 and albumin creatinine ratio <30 mg/g   . Coronary artery disease    a. 02/2012 Lex[facility] MV: Anterior, anteroseptal, septal, apical infarct with mild peri-infarct ischemia, EF 41%- medically managed due to anemia; b. 03/2017 Cath: LM nl, LAD 99ost/p/m - faint L->L collaterals, RI 20ost, LCX 51m, RCA 20p, 2m-->Med Rx for chronic subtotal LAD dzs. Avoid R Radial access in future.  . 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 Hyperlipidemia   . Hypertension   . Ischemic cardiomyopathy    a. 06/2013 Echo: EF 35-40%, glob HK, mildly dil LA, mild LVH, mild TR, mild to mod MR; b. 07/2016 Echo: EF 25-30%, sev  mid-apicalanteroseptal, ant, apical HK, mild MR, sev dil LA, mod dil RA, mod TR, PASP 76mmHg; c. 11/2016 Echo: EF 20-25%, antsept DK, Gr2 DD, mild MR, mod dil LA/RV/RA, mod red RV fxn, mod-sev TR, PASP 40-24mmHg.  . Morbid obesity ([place])   . Normocytic anemia   . Persistent atrial fibrillation (HCC)    a. CHA2DS2VASc = 7-->coumadin; maintaining sinus on amio.  . Stroke [facility]) 2013     SURGICAL HISTORY   Past Surgical History:  Procedure Laterality Date  . AMPUTATION TOE Left 09/22/2016   Procedure: AMPUTATION TOE;  Surgeon: Albertine Patricia, DPM;  Location: [facility] ORS;  Service: Podiatry;  Laterality: Left;  . CARDIOVERSION N/A 07/28/2016   Procedure: CARDIOVERSION;  Surgeon: Wellington Hampshire, MD;  Location: [facility] ORS;  Service: Cardiovascular;  Laterality: N/A;  . CARDIOVERSION N/A 08/07/2016   Procedure: CARDIOVERSION;  Surgeon: Wellington Hampshire, MD;  Location: [facility] ORS;  Service: Cardiovascular;  Laterality: N/A;  . CARDIOVERSION N/A 01/03/2019   Procedure: CARDIOVERSION;  Surgeon: Minna Merritts, MD;  Location: [facility] ORS;  Service: Cardiovascular;  Laterality: N/A;  . CAROTID ENDARTERECTOMY     [facility]; Dr. Lucky Cowboy  . CHOLECYSTECTOMY    . foot infection Right   . LEG SURGERY     right;infection  . RIGHT/LEFT HEART CATH AND CORONARY ANGIOGRAPHY Bilateral 03/12/2017   Procedure: RIGHT/LEFT HEART CATH AND CORONARY ANGIOGRAPHY;  Surgeon: Wellington Hampshire, MD;  Location: [facility];  Service: Cardiovascular;  Laterality: Bilateral;  . TONSILLECTOMY       FAMILY HISTORY   Family History  Problem Relation Age of Onset  . Heart disease Mother   . Diabetes Mother   . Heart  disease Father   . Hypertension Father   . Diabetes Maternal Grandmother      SOCIAL HISTORY   Social History   Tobacco Use  . Smoking status: Former Smoker    Packs/day: 0.50    Years: 5.00    Pack years: 2.50    Types: Cigarettes  . Smokeless tobacco: Never Used  Substance Use Topics  . Alcohol use:  No  . Drug use: No     MEDICATIONS   Current Medication:  Current Facility-Administered Medications:  .  0.9 %  sodium chloride infusion, 250 mL, Intravenous, PRN, Lang Snow, NP, Stopped at 01/17/19 1010 .  acetaminophen (TYLENOL) tablet 650 mg, 650 mg, Oral, Q4H PRN, Lang Snow, NP, 650 mg at 01/18/19 0318 .  albumin human 25 % solution 12.5 g, 12.5 g, Intravenous, Daily, Murlean Iba, MD, Stopped at 01/22/19 1059 .  atorvastatin (LIPITOR) tablet 40 mg, 40 mg, Oral, Daily, Ouma, Bing Neighbors, NP, 40 mg at 01/22/19 1739 .  Chlorhexidine Gluconate Cloth 2 % PADS 6 each, 6 each, Topical, Q0600, Flora Lipps, MD, 6 each at 01/21/19 0808 .  furosemide (LASIX) injection 40 mg, 40 mg, Intravenous, BID, Asberry Lascola, MD .  HYDROcodone-acetaminophen (NORCO/VICODIN) 5-325 MG per tablet 1-2 tablet, 1-2 tablet, Oral, Q6H PRN, Flora Lipps, MD, 1 tablet at 01/22/19 0145 .  hydrocortisone sodium succinate (SOLU-CORTEF) 100 MG injection 100 mg, 100 mg, Intravenous, Q12H, Jahmere Bramel, MD, 100 mg at 01/23/19 0930 .  insulin aspart (novoLOG) injection 0-15 Units, 0-15 Units, Subcutaneous, Q4H, Nichele Slawson, MD .  insulin aspart protamine- aspart (NOVOLOG MIX 70/30) injection 8 Units, 8 Units, Subcutaneous, TID WC, Chandel Zaun, MD .  insulin glargine (LANTUS) injection 12 Units, 12 Units, Subcutaneous, QHS, Tresea Heine, MD .  levothyroxine (SYNTHROID) tablet 50 mcg, 50 mcg, Oral, QAC breakfast, Lang Snow, NP, 50 mcg at 01/23/19 [id number] .  ondansetron (ZOFRAN) injection 4 mg, 4 mg, Intravenous, Q6H PRN, Ouma, Bing Neighbors, NP .  pantoprazole (PROTONIX) injection 40 mg, 40 mg, Intravenous, Q12H, Darel Hong D, NP, 40 mg at 01/23/19 0930 .  polyethylene glycol (MIRALAX / GLYCOLAX) packet 17 g, 17 g, Oral, Daily PRN, Vanga, Tally Due, MD .  potassium chloride 10 mEq in 100 mL IVPB, 10 mEq, Intravenous, Q2H, Dallie Piles, RPH .  sodium  chloride flush (NS) 0.9 % injection 3 mL, 3 mL, Intravenous, Q12H, Ouma, Bing Neighbors, NP, 3 mL at 01/23/19 0932 .  sodium chloride flush (NS) 0.9 % injection 3 mL, 3 mL, Intravenous, PRN, Ouma, Bing Neighbors, NP    ALLERGIES   Patient has no known allergies.    REVIEW OF SYSTEMS    10 point ROS conducted is negative except for right flank pain and abdominal discomfort.  PHYSICAL EXAMINATION   Vital Signs: Temp:  [97.7 F (36.5 C)-98.3 F (36.8 C)] 97.8 F (36.6 C) (10/15 0800) Pulse Rate:  [90-116] 96 (10/15 0900) Resp:  [13-23] 16 (10/15 0900) BP: (92-155)/(61-126) 99/63 (10/15 0900) SpO2:  [92 %-99 %] 93 % (10/15 0900) Weight:  [132.5 kg] 132.5 kg (10/15 0500)  GENERAL:NAD HEAD: Normocephalic, atraumatic.  EYES: Pupils equal, round, reactive to light.  No scleral icterus.  MOUTH: Moist mucosal membrane. NECK: Supple. No thyromegaly. No nodules. No JVD.  PULMONARY: decreased bs bilaterally  CARDIOVASCULAR: S1 and S2. Regular rate and rhythm. No murmurs, rubs, or gallops.  GASTROINTESTINAL: Soft, nontender, non-distended. No masses. Positive bowel sounds. No hepatosplenomegaly.  MUSCULOSKELETAL: Edema improved NEUROLOGIC: Mild distress  due to acute illness SKIN:intact,warm,dry   PERTINENT DATA     Infusions: . sodium chloride Stopped (01/17/19 1010)  . albumin human Stopped (01/22/19 1059)  . potassium chloride     Scheduled Medications: . atorvastatin  40 mg Oral Daily  . Chlorhexidine Gluconate Cloth  6 each Topical Q0600  . furosemide  40 mg Intravenous BID  . hydrocortisone sod succinate (SOLU-CORTEF) inj  100 mg Intravenous Q12H  . insulin aspart  0-15 Units Subcutaneous Q4H  . insulin aspart protamine- aspart  8 Units Subcutaneous TID WC  . insulin glargine  12 Units Subcutaneous QHS  . levothyroxine  50 mcg Oral QAC breakfast  . pantoprazole  40 mg Intravenous Q12H  . sodium chloride flush  3 mL Intravenous Q12H   PRN Medications:  sodium chloride, acetaminophen, HYDROcodone-acetaminophen, ondansetron (ZOFRAN) IV, polyethylene glycol, sodium chloride flush Hemodynamic parameters:   Intake/Output: 10/14 0701 - 10/15 0700 In: [id number] [P.O.:360; I.V.:250.5; IV Piggyback:52.5] Out: [id number]  Ventilator  Settings:     LAB RESULTS:  Basic Metabolic Panel: Recent Labs  Lab 01/18/19 0654 01/19/19 0526 01/20/19 0510 01/21/19 0457 01/22/19 0546 01/23/19 0523  NA 136 136 133* 134* 133* 133*  K 4.6 4.0 3.8 3.5 3.4* 3.3*  CL 101 101 100 97* 94* 91*  CO2 24 25 26 28 29 31   GLUCOSE 70 158* 164* 166* 342* 132*  BUN 71* 72* 61* 53* 52* 47*  CREATININE 3.49* 3.25* 2.86* 2.48* 2.15* 1.75*  CALCIUM 7.8* 7.7* 7.8* 7.8* 7.8* 8.4*  MG 2.3  --   --  2.2  --  2.0  PHOS 5.2*  --   --  3.3  --   --    Liver Function Tests: Recent Labs  Lab 01/17/19 0011 01/17/19 1305  AST 42* 42*  ALT 23 23  ALKPHOS 103 79  BILITOT 1.3* 1.2  PROT 7.7 7.1  ALBUMIN 2.5* 2.2*   Recent Labs  Lab 01/17/19 1305  LIPASE 15  AMYLASE 30   No results for input(s): AMMONIA in the last 168 hours. CBC: Recent Labs  Lab 01/17/19 0011  01/19/19 2053 01/20/19 0510 01/20/19 1313 01/21/19 0457 01/22/19 0546 01/23/19 0523  WBC 6.6   < > 5.5 5.4  --  5.2 5.9 10.1  NEUTROABS 4.7  --   --   --   --  3.3  --  8.7*  HGB 6.7*   < > 7.1* 6.9* 7.5* 7.6* 7.4* 8.0*  HCT 21.7*   < > 23.4* 22.8* 23.8* 24.4* 23.6* 25.5*  MCV 81.9   < > 84.8 84.4  --  84.4 84.3 83.9  PLT 193   < > 176 195  --  206 188 208   < > = values in this interval not displayed.   Cardiac Enzymes: No results for input(s): CKTOTAL, CKMB, CKMBINDEX, TROPONINI in the last 168 hours. BNP: Invalid input(s): POCBNP CBG: Recent Labs  Lab 01/23/19 0405 01/23/19 0512 01/23/19 0616 01/23/19 0733 01/23/19 0837  GLUCAP 133* 133* 136* 139* 155*     IMAGING RESULTS:  Imaging:    ASSESSMENT AND PLAN    -Multidisciplinary rounds held today  Acute Hypoxic  Respiratory Failure   -Due to acute on chronic decompensated systolic CHF -continue BiPAP nocturnally -Chest x-ray reviewed - interval improvement of interstitial edema   Acute on chronic decompensated systolic CHF with EF less than 25%    -Cardiology on case appreciate input-Dr. Arida/Ryan Dunn PA -Will wean off of all drip and optimize  for floor downgrade -Strict I's and O's -1200 cc fluid restriction per 24 hours -Nocturnal BiPAP ICU monitoring   Circulatory shock-resolved    -Likely cardiogenic due to above    -Treat heart failure as above    -Cortisol borderline low for acutely ill state - solucortef bid 100mg     Acute blood loss anemia  -Due to lower GI bleed with melanotic stools per rectum   -Patient again had a decrement in H&H and received 1 unit PRBC -continue to monitor H&H closely -Protonix 40 mg IV twice daily -Status post GI evaluation-recommendation to refer back to GI post stabilization of decompensated CHF -continue Foley Catheter-assess need daily -Prophylactic Rocephin IV-has been stopped    Acute on chronic renal failure KDIGO IV-improved renal function   -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   -Nephrology on case-appreciate input   -Avoid hypotension and nephrotoxins   -Nonoliguric at this time-remains on Lasix GTT currently   Atrial fibrillation   -Chronic, cardiology on case-DC amiodarone appreciate input    Hypothyroidism -Chronic Continue home levothyroxine   ID -continue IV abx as prescibed -follow up cultures  GI/Nutrition GI PROPHYLAXIS as indicated DIET-->TF's as tolerated Constipation protocol as indicated-Dulcolax suppository today due to constipation x3 days  ENDO - ICU hypoglycemic\Hyperglycemia protocol-severe hyperglycemia - Diabetic coordinator and PharmD collaboration appreciated.  -check FSBS per protocol   ELECTROLYTES -follow labs as needed -replace as needed  -pharmacy consultation   DVT/GI PRX ordered -SCDs  TRANSFUSIONS AS NEEDED MONITOR FSBS ASSESS the need for LABS as needed   Critical care provider statement:    Critical care time (minutes):  33   Critical care time was exclusive of:  Separately billable procedures and treating other patients   Critical care was necessary to treat or prevent imminent or life-threatening deterioration of the following conditions:   Acute on chronic decompensated systolic CHF, acute hypoxemic respiratory failure, acute blood loss anemia, multiple comorbid conditions   Critical care was time spent personally by me on the following activities:  Development of treatment plan with patient or surrogate, discussions with consultants, evaluation of patient's response to treatment, examination of patient, obtaining history from patient or surrogate, ordering and performing treatments and interventions, ordering and review of laboratory studies and re-evaluation of patient's condition.  I assumed direction of critical care for this patient from another provider in my specialty: no    This document was prepared using Dragon voice recognition software and may include unintentional dictation errors.    Ottie Glazier, M.D.  Division of [facility]

## 2019-01-24 DIAGNOSIS — D649 Anemia, unspecified: Secondary | ICD-10-CM | POA: Diagnosis not present

## 2019-01-24 DIAGNOSIS — K921 Melena: Secondary | ICD-10-CM | POA: Diagnosis not present

## 2019-01-24 DIAGNOSIS — N049 Nephrotic syndrome with unspecified morphologic changes: Secondary | ICD-10-CM | POA: Diagnosis not present

## 2019-01-24 DIAGNOSIS — I480 Paroxysmal atrial fibrillation: Secondary | ICD-10-CM

## 2019-01-24 DIAGNOSIS — I5023 Acute on chronic systolic (congestive) heart failure: Secondary | ICD-10-CM | POA: Diagnosis not present

## 2019-01-24 LAB — GLUCOSE, CAPILLARY
Glucose-Capillary: 128 mg/dL — ABNORMAL HIGH (ref 70–99)
Glucose-Capillary: 132 mg/dL — ABNORMAL HIGH (ref 70–99)
Glucose-Capillary: 148 mg/dL — ABNORMAL HIGH (ref 70–99)
Glucose-Capillary: 151 mg/dL — ABNORMAL HIGH (ref 70–99)
Glucose-Capillary: 156 mg/dL — ABNORMAL HIGH (ref 70–99)
Glucose-Capillary: 163 mg/dL — ABNORMAL HIGH (ref 70–99)
Glucose-Capillary: 173 mg/dL — ABNORMAL HIGH (ref 70–99)
Glucose-Capillary: 210 mg/dL — ABNORMAL HIGH (ref 70–99)
Glucose-Capillary: 226 mg/dL — ABNORMAL HIGH (ref 70–99)
Glucose-Capillary: 231 mg/dL — ABNORMAL HIGH (ref 70–99)
Glucose-Capillary: 247 mg/dL — ABNORMAL HIGH (ref 70–99)
Glucose-Capillary: 260 mg/dL — ABNORMAL HIGH (ref 70–99)
Glucose-Capillary: 282 mg/dL — ABNORMAL HIGH (ref 70–99)

## 2019-01-24 LAB — MAGNESIUM: Magnesium: 2.1 mg/dL (ref 1.7–2.4)

## 2019-01-24 LAB — BASIC METABOLIC PANEL
Anion gap: 10 (ref 5–15)
BUN: 55 mg/dL — ABNORMAL HIGH (ref 8–23)
CO2: 33 mmol/L — ABNORMAL HIGH (ref 22–32)
Calcium: 8.2 mg/dL — ABNORMAL LOW (ref 8.9–10.3)
Chloride: 92 mmol/L — ABNORMAL LOW (ref 98–111)
Creatinine, Ser: 1.83 mg/dL — ABNORMAL HIGH (ref 0.44–1.00)
GFR calc Af Amer: 32 mL/min — ABNORMAL LOW (ref >60)
GFR calc non Af Amer: 27 mL/min — ABNORMAL LOW (ref >60)
Glucose, Bld: 158 mg/dL — ABNORMAL HIGH (ref 70–99)
Potassium: 3.1 mmol/L — ABNORMAL LOW (ref 3.5–5.1)
Sodium: 135 mmol/L (ref 135–145)

## 2019-01-24 LAB — HEMOGLOBIN
Hemoglobin: 8.1 g/dL — ABNORMAL LOW (ref 12.0–15.0)
Hemoglobin: 8.3 g/dL — ABNORMAL LOW (ref 12.0–15.0)

## 2019-01-24 LAB — PROTIME-INR
INR: 1.2 (ref 0.8–1.2)
Prothrombin Time: 14.9 seconds (ref 11.4–15.2)

## 2019-01-24 MED ORDER — HYDROCORTISONE NA SUCCINATE PF 100 MG IJ SOLR
50.0000 mg | Freq: Every day | INTRAMUSCULAR | Status: DC
  Administered 2019-01-26 – 2019-01-28 (×3): 50 mg via INTRAVENOUS
  Filled 2019-01-24 (×3): qty 2

## 2019-01-24 MED ORDER — POTASSIUM CHLORIDE CRYS ER 20 MEQ PO TBCR
20.0000 meq | EXTENDED_RELEASE_TABLET | Freq: Two times a day (BID) | ORAL | Status: DC
  Administered 2019-01-24 – 2019-02-06 (×26): 20 meq via ORAL
  Filled 2019-01-24 (×25): qty 1

## 2019-01-24 MED ORDER — POTASSIUM CHLORIDE 10 MEQ/100ML IV SOLN
10.0000 meq | INTRAVENOUS | Status: AC
  Administered 2019-01-24 (×2): 10 meq via INTRAVENOUS
  Filled 2019-01-24 (×4): qty 100
  Filled 2019-01-24: qty 200

## 2019-01-24 MED ORDER — TORSEMIDE 20 MG PO TABS: 40.0000 mg | ORAL_TABLET | Freq: Two times a day (BID) | ORAL | Status: DC

## 2019-01-24 MED ORDER — TORSEMIDE 20 MG PO TABS: 40.0000 mg | ORAL_TABLET | Freq: Every day | ORAL | Status: DC

## 2019-01-24 MED ORDER — POTASSIUM CHLORIDE 10 MEQ/100ML IV SOLN
10.0000 meq | INTRAVENOUS | Status: DC
  Administered 2019-01-24: 10 meq via INTRAVENOUS
  Filled 2019-01-24: qty 100

## 2019-01-24 NOTE — Progress Notes (Signed)
Vonda Antigua, MD [address], [place], [place], [place], [address] [address], [place], [place], [place], [address] Phone: [phone]  Fax: [phone]   Subjective: GI had previously been consulted for rectal bleeding in the setting of supratherapeutic INR and CHF.  At that time endoscopy was deferred due to acute issues and GI is being reconsulted for melena noted today.   Objective: Exam: Vital signs in last 24 hours: Vitals:   01/24/19 0239 01/24/19 0538 01/24/19 0814 01/24/19 1138  BP:  111/90 124/84 99/65  Pulse:  100 91 92  Resp:  20 19 18   Temp:  (!) 97.4 F (36.3 C)  97.6 F (36.4 C)  TempSrc:  Oral  Oral  SpO2:  96% 96% 94%  Weight: 133.4 kg     Height:       Weight change: 0.9 kg  Intake/Output Summary (Last 24 hours) at 01/24/2019 1516 Last data filed at 01/24/2019 1300 Gross per 24 hour  Intake 1040 ml  Output 700 ml  Net 340 ml    General: No acute distress, AAO x3 Abd: Soft, NT/ND, No HSM Skin: Warm, no rashes Neck: Supple, Trachea midline   Lab Results: Lab Results  Component Value Date   WBC 10.1 01/23/2019   HGB 8.1 (L) 01/24/2019   HCT 25.5 (L) 01/23/2019   MCV 83.9 01/23/2019   PLT 208 01/23/2019   Micro Results: Recent Results (from the past 240 hour(s))  SARS Coronavirus 2 by RT PCR (hospital order, performed in [place] hospital lab) Nasopharyngeal Nasopharyngeal Swab     Status: None   Collection Time: 01/17/19  1:41 AM   Specimen: Nasopharyngeal Swab  Result Value Ref Range Status   SARS Coronavirus 2 NEGATIVE NEGATIVE Final    Comment: (NOTE) If result is NEGATIVE SARS-CoV-2 target nucleic acids are NOT DETECTED. The SARS-CoV-2 RNA is generally detectable in upper and lower  respiratory specimens during the acute phase of infection. The lowest  concentration of SARS-CoV-2 viral copies this assay can detect is 250  copies / mL. A negative result does not preclude SARS-CoV-2 infection  and should not be  used as the sole basis for treatment or other  patient management decisions.  A negative result may occur with  improper specimen collection / handling, submission of specimen other  than nasopharyngeal swab, presence of viral mutation(s) within the  areas targeted by this assay, and inadequate number of viral copies  (<250 copies / mL). A negative result must be combined with clinical  observations, patient history, and epidemiological information. If result is POSITIVE SARS-CoV-2 target nucleic acids are DETECTED. The SARS-CoV-2 RNA is generally detectable in upper and lower  respiratory specimens dur ing the acute phase of infection.  Positive  results are indicative of active infection with SARS-CoV-2.  Clinical  correlation with patient history and other diagnostic information is  necessary to determine patient infection status.  Positive results do  not rule out bacterial infection or co-infection with other viruses. If result is PRESUMPTIVE POSTIVE SARS-CoV-2 nucleic acids MAY BE PRESENT.   A presumptive positive result was obtained on the submitted specimen  and confirmed on repeat testing.  While 2019 novel coronavirus  (SARS-CoV-2) nucleic acids may be present in the submitted sample  additional confirmatory testing may be necessary for epidemiological  and / or clinical management purposes  to differentiate between  SARS-CoV-2 and other Sarbecovirus currently known to infect humans.  If clinically indicated additional testing with an alternate test  methodology [phone]) is advised. The SARS-CoV-2 RNA is generally  detectable in upper and lower respiratory sp ecimens during the acute  phase of infection. The expected result is Negative. Fact Sheet for Patients:  StrictlyIdeas.no Fact Sheet for Healthcare Providers: BankingDealers.co.za This test is not yet approved or cleared by the [place] FDA and has been authorized  for detection and/or diagnosis of SARS-CoV-2 by FDA under an Emergency Use Authorization (EUA).  This EUA will remain in effect (meaning this test can be used) for the duration of the COVID-19 declaration under Section 564(b)(1) of the Act, 21 U.S.C. section 360bbb-3(b)(1), unless the authorization is terminated or revoked sooner. Performed at [facility], [address] [place], [place] [address]   MRSA PCR Screening     Status: None   Collection Time: 01/17/19  4:44 AM   Specimen: Nasal Mucosa; Nasopharyngeal  Result Value Ref Range Status   MRSA by PCR NEGATIVE NEGATIVE Final    Comment:        The GeneXpert MRSA Assay (FDA approved for NASAL specimens only), is one component of a comprehensive MRSA colonization surveillance program. It is not intended to diagnose MRSA infection nor to guide or monitor treatment for MRSA infections. Performed at [facility], [address] [place], [place] [address]   Urine Culture     Status: Abnormal   Collection Time: 01/17/19 11:52 AM   Specimen: Urine, Clean Catch  Result Value Ref Range Status   Specimen Description   Final    URINE, CLEAN CATCH Performed at [facility], [address], [place], [place] [address]    Special Requests   Final    Normal Performed at [facility], [place], [place] [address]    Culture >=100,000 COLONIES/mL ESCHERICHIA COLI (A)  Final   Report Status 01/19/2019 FINAL  Final   Organism ID, Bacteria ESCHERICHIA COLI (A)  Final      Susceptibility   Escherichia coli - MIC*    AMPICILLIN >=32 RESISTANT Resistant     CEFAZOLIN <=4 SENSITIVE Sensitive     CEFTRIAXONE <=1 SENSITIVE Sensitive     CIPROFLOXACIN >=4 RESISTANT Resistant     GENTAMICIN <=1 SENSITIVE Sensitive     IMIPENEM <=0.25 SENSITIVE Sensitive     NITROFURANTOIN <=16 SENSITIVE Sensitive     TRIMETH/SULFA <=20 SENSITIVE Sensitive     AMPICILLIN/SULBACTAM 8 SENSITIVE Sensitive      PIP/TAZO <=4 SENSITIVE Sensitive     Extended ESBL NEGATIVE Sensitive     * >=100,000 COLONIES/mL ESCHERICHIA COLI   Studies/Results: No results found. Medications:  Scheduled Meds: . atorvastatin  40 mg Oral Daily  . Chlorhexidine Gluconate Cloth  6 each Topical Q0600  . furosemide  40 mg Intravenous BID  . [START ON 01/25/2019] hydrocortisone sod succinate (SOLU-CORTEF) inj  50 mg Intravenous Daily  . insulin aspart  0-15 Units Subcutaneous Q4H  . insulin aspart protamine- aspart  8 Units Subcutaneous TID WC  . insulin glargine  12 Units Subcutaneous QHS  . levothyroxine  50 mcg Oral QAC breakfast  . pantoprazole  40 mg Intravenous Q12H  . potassium chloride  20 mEq Oral BID  . sodium chloride flush  3 mL Intravenous Q12H   Continuous Infusions: . sodium chloride Stopped (01/17/19 1010)  . albumin human 12.5 g (01/24/19 1049)   PRN Meds:.sodium chloride, acetaminophen, HYDROcodone-acetaminophen, ondansetron (ZOFRAN) IV, polyethylene glycol, sodium chloride flush   Assessment: Active Problems:   Acute on chronic systolic CHF (  congestive heart failure) ([place]) Melena   Plan: Hemoglobin this morning was stable at 8.1, which was the same as yesterday, but this was done prior to the episode of melena Repeat CBC pending   Pt on oxygen via nasal cannula  Recheck INR, although I do not see that Coumadin has been given in days  Would recommend upper endoscopy. Will plan for tomorrow if anesthesia agrees  Pt ate solid food all day today, therefore unable to prep for colonoscopy today If EGD is negative, can plan for colonoscopy in 1-2 days  Continue monitoring CBC and transfuse as needed PPI twice daily Avoid NSAIDs  Please page GI with any signs of active GI bleeding   LOS: 7 days   Vonda Antigua, MD 01/24/2019, 3:16 PM

## 2019-01-24 NOTE — Progress Notes (Signed)
Patient ID: Meghan [name], female   DOB: February 24, 1951, 68 y.o.   MRN: [id number]  [facility] PROGRESS NOTE  Meghan [name] [id number] DOB: [birth year] DOA: 01/17/2019 PCP: [name], MD  HPI/Subjective: Patient breathing better.  Still has some shortness of breath.  Had an episode of melena today.  Objective: Vitals:   01/24/19 0814 01/24/19 1138  BP: 124/84 99/65  Pulse: 91 92  Resp: 19 18  Temp:  97.6 F (36.4 C)  SpO2: 96% 94%    Intake/Output Summary (Last 24 hours) at 01/24/2019 1417 Last data filed at 01/24/2019 1300 Gross per 24 hour  Intake 1040 ml  Output 700 ml  Net 340 ml   Filed Weights   01/22/19 0500 01/23/19 0500 01/24/19 0239  Weight: 133.8 kg 132.5 kg 133.4 kg    ROS: Review of Systems  Constitutional: Negative for chills and fever.  Eyes: Negative for blurred vision.  Respiratory: Positive for shortness of breath. Negative for cough.   Cardiovascular: Negative for chest pain.  Gastrointestinal: Positive for melena. Negative for abdominal pain, constipation, diarrhea, nausea and vomiting.  Genitourinary: Negative for dysuria.  Musculoskeletal: Negative for joint pain.  Neurological: Negative for dizziness and headaches.   Exam: Physical Exam  Constitutional: She is oriented to person, place, and time.  HENT:  Nose: No mucosal edema.  Mouth/Throat: No oropharyngeal exudate or posterior oropharyngeal edema.  Eyes: Pupils are equal, round, and reactive to light. Conjunctivae, EOM and lids are normal.  Neck: No JVD present. Carotid bruit is not present. No edema present. No thyroid mass and no thyromegaly present.  Cardiovascular: S1 normal and S2 normal. Exam reveals no gallop.  No murmur heard. Pulses:      Dorsalis pedis pulses are 2+ on the right side and 2+ on the left side.  Respiratory: No respiratory distress. She has decreased breath sounds in the right lower field and the left lower field. She has no wheezes. She has no rhonchi. She has  rales in the right lower field and the left lower field.  GI: Soft. Bowel sounds are normal. There is no abdominal tenderness.  Musculoskeletal:     Right ankle: She exhibits swelling.     Left ankle: She exhibits swelling.  Lymphadenopathy:    She has no cervical adenopathy.  Neurological: She is alert and oriented to person, place, and time. No cranial nerve deficit.  Skin: Skin is warm. Nails show no clubbing.  Chronic lower extremity discoloration  Psychiatric: She has a normal mood and affect.      Data Reviewed: Basic Metabolic Panel: Recent Labs  Lab 01/18/19 0654  01/20/19 0510 01/21/19 0457 01/22/19 0546 01/23/19 0523 01/24/19 0524  NA 136   < > 133* 134* 133* 133* 135  K 4.6   < > 3.8 3.5 3.4* 3.3* 3.1*  CL 101   < > 100 97* 94* 91* 92*  CO2 24   < > 26 28 29 31  33*  GLUCOSE 70   < > 164* 166* 342* 132* 158*  BUN 71*   < > 61* 53* 52* 47* 55*  CREATININE 3.49*   < > 2.86* 2.48* 2.15* 1.75* 1.83*  CALCIUM 7.8*   < > 7.8* 7.8* 7.8* 8.4* 8.2*  MG 2.3  --   --  2.2  --  2.0 2.1  PHOS 5.2*  --   --  3.3  --   --   --    < > = values in this interval not displayed.  CBC: Recent Labs  Lab 01/19/19 2053 01/20/19 0510 01/20/19 1313 01/21/19 0457 01/22/19 0546 01/23/19 0523 01/24/19 0524  WBC 5.5 5.4  --  5.2 5.9 10.1  --   NEUTROABS  --   --   --  3.3  --  8.7*  --   HGB 7.1* 6.9* 7.5* 7.6* 7.4* 8.0* 8.1*  HCT 23.4* 22.8* 23.8* 24.4* 23.6* 25.5*  --   MCV 84.8 84.4  --  84.4 84.3 83.9  --   PLT 176 195  --  206 188 208  --    BNP (last 3 results) Recent Labs    12/13/18 1136 01/17/19 0011  BNP 1,269.0* 790.0*     CBG: Recent Labs  Lab 01/24/19 0355 01/24/19 0624 01/24/19 0805 01/24/19 1008 01/24/19 1137  GLUCAP 156* 148* 132* 247* 282*    Recent Results (from the past 240 hour(s))  SARS Coronavirus 2 by RT PCR (hospital order, performed in [facility] hospital lab) Nasopharyngeal Nasopharyngeal Swab     Status: None   Collection Time:  01/17/19  1:41 AM   Specimen: Nasopharyngeal Swab  Result Value Ref Range Status   SARS Coronavirus 2 NEGATIVE NEGATIVE Final    Comment: (NOTE) If result is NEGATIVE SARS-CoV-2 target nucleic acids are NOT DETECTED. The SARS-CoV-2 RNA is generally detectable in upper and lower  respiratory specimens during the acute phase of infection. The lowest  concentration of SARS-CoV-2 viral copies this assay can detect is 250  copies / mL. A negative result does not preclude SARS-CoV-2 infection  and should not be used as the sole basis for treatment or other  patient management decisions.  A negative result may occur with  improper specimen collection / handling, submission of specimen other  than nasopharyngeal swab, presence of viral mutation(s) within the  areas targeted by this assay, and inadequate number of viral copies  (<250 copies / mL). A negative result must be combined with clinical  observations, patient history, and epidemiological information. If result is POSITIVE SARS-CoV-2 target nucleic acids are DETECTED. The SARS-CoV-2 RNA is generally detectable in upper and lower  respiratory specimens dur ing the acute phase of infection.  Positive  results are indicative of active infection with SARS-CoV-2.  Clinical  correlation with patient history and other diagnostic information is  necessary to determine patient infection status.  Positive results do  not rule out bacterial infection or co-infection with other viruses. If result is PRESUMPTIVE POSTIVE SARS-CoV-2 nucleic acids MAY BE PRESENT.   A presumptive positive result was obtained on the submitted specimen  and confirmed on repeat testing.  While 2019 novel coronavirus  (SARS-CoV-2) nucleic acids may be present in the submitted sample  additional confirmatory testing may be necessary for epidemiological  and / or clinical management purposes  to differentiate between  SARS-CoV-2 and other Sarbecovirus currently known to  infect humans.  If clinically indicated additional testing with an alternate test  methodology [phone]) is advised. The SARS-CoV-2 RNA is generally  detectable in upper and lower respiratory sp ecimens during the acute  phase of infection. The expected result is Negative. Fact Sheet for Patients:  StrictlyIdeas.no Fact Sheet for Healthcare Providers: BankingDealers.co.za This test is not yet approved or cleared by the [place] FDA and has been authorized for detection and/or diagnosis of SARS-CoV-2 by FDA under an Emergency Use Authorization (EUA).  This EUA will remain in effect (meaning this test can be used) for the duration of the COVID-19 declaration under Section 564(b)(1)  of the Act, 21 U.S.C. section 360bbb-3(b)(1), unless the authorization is terminated or revoked sooner. Performed at [facility], [address] [place], [place] [address]   MRSA PCR Screening     Status: None   Collection Time: 01/17/19  4:44 AM   Specimen: Nasal Mucosa; Nasopharyngeal  Result Value Ref Range Status   MRSA by PCR NEGATIVE NEGATIVE Final    Comment:        The GeneXpert MRSA Assay (FDA approved for NASAL specimens only), is one component of a comprehensive MRSA colonization surveillance program. It is not intended to diagnose MRSA infection nor to guide or monitor treatment for MRSA infections. Performed at [facility], [address] [place], [place] [address]   Urine Culture     Status: Abnormal   Collection Time: 01/17/19 11:52 AM   Specimen: Urine, Clean Catch  Result Value Ref Range Status   Specimen Description   Final    URINE, CLEAN CATCH Performed at [facility], [address] [place], [place] [address]    Special Requests   Final    Normal Performed at [facility], [place], [place] [address]    Culture >=100,000 COLONIES/mL ESCHERICHIA COLI (A)  Final    Report Status 01/19/2019 FINAL  Final   Organism ID, Bacteria ESCHERICHIA COLI (A)  Final      Susceptibility   Escherichia coli - MIC*    AMPICILLIN >=32 RESISTANT Resistant     CEFAZOLIN <=4 SENSITIVE Sensitive     CEFTRIAXONE <=1 SENSITIVE Sensitive     CIPROFLOXACIN >=4 RESISTANT Resistant     GENTAMICIN <=1 SENSITIVE Sensitive     IMIPENEM <=0.25 SENSITIVE Sensitive     NITROFURANTOIN <=16 SENSITIVE Sensitive     TRIMETH/SULFA <=20 SENSITIVE Sensitive     AMPICILLIN/SULBACTAM 8 SENSITIVE Sensitive     PIP/TAZO <=4 SENSITIVE Sensitive     Extended ESBL NEGATIVE Sensitive     * >=100,000 COLONIES/mL ESCHERICHIA COLI      Scheduled Meds: . atorvastatin  40 mg Oral Daily  . Chlorhexidine Gluconate Cloth  6 each Topical Q0600  . furosemide  40 mg Intravenous BID  . hydrocortisone sod succinate (SOLU-CORTEF) inj  50 mg Intravenous Q12H  . insulin aspart  0-15 Units Subcutaneous Q4H  . insulin aspart protamine- aspart  8 Units Subcutaneous TID WC  . insulin glargine  12 Units Subcutaneous QHS  . levothyroxine  50 mcg Oral QAC breakfast  . pantoprazole  40 mg Intravenous Q12H  . potassium chloride  20 mEq Oral BID  . sodium chloride flush  3 mL Intravenous Q12H   Continuous Infusions: . sodium chloride Stopped (01/17/19 1010)  . albumin human 12.5 g (01/24/19 1049)    Assessment/Plan:  1. Acute on chronic systolic congestive heart failure.  Patient taken off milrinone and Lasix drip.  Now on Lasix 40 mg IV twice daily.  Holding losartan.  BiPAP nightly. 2. Acute hypoxic respiratory failure.  Patient on 3 L of oxygen and normally she does not wear oxygen.  Taper oxygen. 3. Cardiogenic shock.  Decrease Solu-Cortef to 50 mg daily dose.  Holding other antihypertensive medications. 4. GI bleed and melena.  Vitamin K reverse Coumadin. Reconsulted GI.  Hemoglobin 8.1 today.  Patient on IV Protonix twice daily 5. Acute blood loss anemia.  Status post multiple  transfusions. 6. Acute kidney injury on chronic kidney disease 3 secondary to cardiorenal syndrome.  Creatinine improving with diuresis. 7. Persistent atrial fibrillation  status post cardioversion and atrial fibrillation.  Amiodarone.  Holding anticoagulation.  8. Type 2 diabetes mellitus on sliding scale insulin and long-acting insulin. 9. History of CVA on Coumadin 10. Weakness physical therapy evaluation appreciated.  PT recommending home with home health. 11. Urinary tract infection treated with 5 days of Rocephin.  Code Status:     Code Status Orders  (From admission, onward)         Start     Ordered   01/17/19 0302  Full code  Continuous     01/17/19 0306        Code Status History    Date Active Date Inactive Code Status Order ID Comments User Context   12/13/2018 1415 12/15/2018 1952 Full Code [id number]  Otila Back, MD ED   03/30/2017 2002 04/01/2017 1940 DNR [id number]  Demetrios Loll, MD Inpatient   03/12/2017 1236 03/12/2017 1720 Full Code [id number]  Wellington Hampshire, MD Inpatient   09/21/2016 2056 09/24/2016 1922 Full Code [id number]  Theodoro Grist, MD Inpatient   07/26/2016 1530 07/28/2016 1812 Full Code [id number]  Theodoro Grist, MD Inpatient   Advance Care Planning Activity     Family Communication: Spoke with daughter at the bedside Disposition Plan: To be determined  Consultants:  Cardiology  Nephrology  Gastroenterology  Time spent: 28 minutes, case discussed with gastroenterology  [facility]

## 2019-01-24 NOTE — Progress Notes (Signed)
[facility]eghan [name] [facility], [place] 01/24/19  Subjective:  Renal function continues to improve. Creatinine down to 1.8. However urine output was down to 1.1 L over the preceding 24 hours as she did come off of the Lasix drip.   Objective:  Vital signs in last 24 hours:  Temp:  [97.4 F (36.3 C)-97.6 F (36.4 C)] 97.6 F (36.4 C) (10/16 1138) Pulse Rate:  [91-116] 92 (10/16 1138) Resp:  [18-22] 18 (10/16 1138) BP: (99-124)/(65-93) 99/65 (10/16 1138) SpO2:  [94 %-99 %] 94 % (10/16 1138) Weight:  [133.4 kg] 133.4 kg (10/16 0239)  Weight change: 0.9 kg Filed Weights   01/22/19 0500 01/23/19 0500 01/24/19 0239  Weight: 133.8 kg 132.5 kg 133.4 kg    Intake/Output:    Intake/Output Summary (Last 24 hours) at 01/24/2019 1531 Last data filed at 01/24/2019 1300 Gross per 24 hour  Intake 1040 ml  Output 700 ml  Net 340 ml    Physical Exam: General:  No acute distress, obese female, laying in the bed  HEENT  moist oral mucous membranes  Neck:  Supple  Lungs:   Clear bilateral, normal effort  Heart::  S1S2 no rubs  Abdomen:  Soft, nontender  Extremities:  2+ pitting edema  Neurologic:  Alert, able to answer questions  Skin:  Warm, dry  Foley in place    Basic Metabolic Panel:  Recent Labs  Lab 01/18/19 0654  01/20/19 0510 01/21/19 0457 01/22/19 0546 01/23/19 0523 01/24/19 0524  NA 136   < > 133* 134* 133* 133* 135  K 4.6   < > 3.8 3.5 3.4* 3.3* 3.1*  CL 101   < > 100 97* 94* 91* 92*  CO2 24   < > 26 28 29 31  33*  GLUCOSE 70   < > 164* 166* 342* 132* 158*  BUN 71*   < > 61* 53* 52* 47* 55*  CREATININE 3.49*   < > 2.86* 2.48* 2.15* 1.75* 1.83*  CALCIUM 7.8*   < > 7.8* 7.8* 7.8* 8.4* 8.2*  MG 2.3  --   --  2.2  --  2.0 2.1  PHOS 5.2*  --   --  3.3  --   --   --    < > = values in this interval not displayed.     CBC: Recent Labs  Lab 01/19/19 2053 01/20/19 0510 01/20/19 1313 01/21/19 0457 01/22/19 0546 01/23/19 0523 01/24/19 0524  WBC  5.5 5.4  --  5.2 5.9 10.1  --   NEUTROABS  --   --   --  3.3  --  8.7*  --   HGB 7.1* 6.9* 7.5* 7.6* 7.4* 8.0* 8.1*  HCT 23.4* 22.8* 23.8* 24.4* 23.6* 25.5*  --   MCV 84.8 84.4  --  84.4 84.3 83.9  --   PLT 176 195  --  206 188 208  --      No results found for: HEPBSAG, HEPBSAB, HEPBIGM    Microbiology:  Recent Results (from the past 240 hour(s))  SARS Coronavirus 2 by RT PCR (hospital order, performed in [facility] hospital lab) Nasopharyngeal Nasopharyngeal Swab     Status: None   Collection Time: 01/17/19  1:41 AM   Specimen: Nasopharyngeal Swab  Result Value Ref Range Status   SARS Coronavirus 2 NEGATIVE NEGATIVE Final    Comment: (NOTE) If result is NEGATIVE SARS-CoV-2 target nucleic acids are NOT DETECTED. The SARS-CoV-2 RNA is generally detectable in upper and lower  respiratory  specimens during the acute phase of infection. The lowest  concentration of SARS-CoV-2 viral copies this assay can detect is 250  copies / mL. A negative result does not preclude SARS-CoV-2 infection  and should not be used as the sole basis for treatment or other  patient management decisions.  A negative result may occur with  improper specimen collection / handling, submission of specimen other  than nasopharyngeal swab, presence of viral mutation(s) within the  areas targeted by this assay, and inadequate number of viral copies  (<250 copies / mL). A negative result must be combined with clinical  observations, patient history, and epidemiological information. If result is POSITIVE SARS-CoV-2 target nucleic acids are DETECTED. The SARS-CoV-2 RNA is generally detectable in upper and lower  respiratory specimens dur ing the acute phase of infection.  Positive  results are indicative of active infection with SARS-CoV-2.  Clinical  correlation with patient history and other diagnostic information is  necessary to determine patient infection status.  Positive results do  not rule out  bacterial infection or co-infection with other viruses. If result is PRESUMPTIVE POSTIVE SARS-CoV-2 nucleic acids MAY BE PRESENT.   A presumptive positive result was obtained on the submitted specimen  and confirmed on repeat testing.  While 2019 novel coronavirus  (SARS-CoV-2) nucleic acids may be present in the submitted sample  additional confirmatory testing may be necessary for epidemiological  and / or clinical management purposes  to differentiate between  SARS-CoV-2 and other Sarbecovirus currently known to infect humans.  If clinically indicated additional testing with an alternate test  methodology [phone]) is advised. The SARS-CoV-2 RNA is generally  detectable in upper and lower respiratory sp ecimens during the acute  phase of infection. The expected result is Negative. Fact Sheet for Patients:  StrictlyIdeas.no Fact Sheet for Healthcare Providers: BankingDealers.co.za This test is not yet approved or cleared by the [place] FDA and has been authorized for detection and/or diagnosis of SARS-CoV-2 by FDA under an Emergency Use Authorization (EUA).  This EUA will remain in effect (meaning this test can be used) for the duration of the COVID-19 declaration under Section 564(b)(1) of the Act, 21 U.S.C. section 360bbb-3(b)(1), unless the authorization is terminated or revoked sooner. Performed at [facility], [address] [place], [place] [address]   MRSA PCR Screening     Status: None   Collection Time: 01/17/19  4:44 AM   Specimen: Nasal Mucosa; Nasopharyngeal  Result Value Ref Range Status   MRSA by PCR NEGATIVE NEGATIVE Final    Comment:        The GeneXpert MRSA Assay (FDA approved for NASAL specimens only), is one component of a comprehensive MRSA colonization surveillance program. It is not intended to diagnose MRSA infection nor to guide or monitor treatment for MRSA infections. Performed at  [facility], [address] [place], [place] [address]   Urine Culture     Status: Abnormal   Collection Time: 01/17/19 11:52 AM   Specimen: Urine, Clean Catch  Result Value Ref Range Status   Specimen Description   Final    URINE, CLEAN CATCH Performed at [facility], [address], [place], [place] [address]    Special Requests   Final    Normal Performed at [facility], [address] [place], [place] [address]    Culture >=100,000 COLONIES/mL ESCHERICHIA COLI (A)  Final   Report Status 01/19/2019 FINAL  Final   Organism ID, Bacteria ESCHERICHIA COLI (A)  Final      Susceptibility   Escherichia coli - MIC*    AMPICILLIN >=32 RESISTANT Resistant     CEFAZOLIN <=4 SENSITIVE Sensitive     CEFTRIAXONE <=1 SENSITIVE Sensitive     CIPROFLOXACIN >=4 RESISTANT Resistant     GENTAMICIN <=1 SENSITIVE Sensitive     IMIPENEM <=0.25 SENSITIVE Sensitive     NITROFURANTOIN <=16 SENSITIVE Sensitive     TRIMETH/SULFA <=20 SENSITIVE Sensitive     AMPICILLIN/SULBACTAM 8 SENSITIVE Sensitive     PIP/TAZO <=4 SENSITIVE Sensitive     Extended ESBL NEGATIVE Sensitive     * >=100,000 COLONIES/mL ESCHERICHIA COLI    Coagulation Studies: No results for input(s): LABPROT, INR in the last 72 hours.  Urinalysis: No results for input(s): COLORURINE, LABSPEC, PHURINE, GLUCOSEU, HGBUR, BILIRUBINUR, KETONESUR, PROTEINUR, UROBILINOGEN, NITRITE, LEUKOCYTESUR in the last 72 hours.  Invalid input(s): APPERANCEUR    Imaging: No results found.   Medications:    Current Facility-Administered Medications (Endocrine & Metabolic):  Derrill Memo ON 01/25/2019] hydrocortisone sodium succinate (SOLU-CORTEF) 100 MG injection 50 mg .  insulin aspart (novoLOG) injection 0-15 Units .  insulin aspart protamine- aspart (NOVOLOG MIX 70/30) injection 8 Units .  insulin glargine (LANTUS) injection 12 Units .  levothyroxine (SYNTHROID) tablet 50 mcg   Current Facility-Administered  Medications (Cardiovascular):  .  atorvastatin (LIPITOR) tablet 40 mg .  furosemide (LASIX) injection 40 mg     Current Facility-Administered Medications (Analgesics):  .  acetaminophen (TYLENOL) tablet 650 mg .  HYDROcodone-acetaminophen (NORCO/VICODIN) 5-325 MG per tablet 1-2 tablet   Current Facility-Administered Medications (Hematological):  .  albumin human 25 % solution 12.5 g   Current Facility-Administered Medications (Other):  .  0.9 %  sodium chloride infusion .  Chlorhexidine Gluconate Cloth 2 % PADS 6 each .  ondansetron (ZOFRAN) injection 4 mg .  pantoprazole (PROTONIX) injection 40 mg .  polyethylene glycol (MIRALAX / GLYCOLAX) packet 17 g .  potassium chloride SA (KLOR-CON) CR tablet 20 mEq .  sodium chloride flush (NS) 0.9 % injection 3 mL .  sodium chloride flush (NS) 0.9 % injection 3 mL  No current outpatient medications on file.    Assessment/ Plan:  68 y.o. female with  medical problems of chronic systolic congestive heart failure, coronary disease, atrial fibrillation requiring anticoagulation, diabetes, anemia, carotid artery disease with history of left carotid endarterectomy, previous stroke, hypertension, chronic kidney disease, was admitted on 01/17/2019 with shortness of breath worsening edema, acute kidney injury.   1.  Acute kidney injury 2.  Chronic kidney disease stage III 3.  Shortness of breath 4.  Generalized edema 5.  Severe anemia 6.  Diabetes, insulin-dependent.  Hemoglobin A1c 7.4% December 14, 2018 7.  Hematuria 8.  Proteinuria 9.  Severe chronic systolic congestive heart failure LVEF 25 to 30% 10.  Pulmonary hypertension 11.  Atrial fibrillation requiring anticoagulation 12.  Hyponatremia 13. UTI - E Coli 01/17/2019    Acute kidney injury is likely secondary to cardiorenal syndrome.  Patient has relative hypotension which may be leading to renal hypoperfusion.  She has underlying chronic kidney disease secondary to diabetes and  hypertension.  Patient is also noted to have severe chronic congestive heart failure and hypoalbuminemia.  Recommend: -Creatinine down to 1.8 at the moment.  Urine output was only 1.1 L over the preceding 24 hours however she is now off of Lasix drip.  However her edema and volume status have improved significantly.  Continue furosemide 40 mg IV twice daily  for now.  Further plan as patient progresses.  LOS: 7 Sidda Humm 10/16/20203:31 PM  [place], [place]  Note: This note was prepared with Dragon dictation. Any transcription errors are unintentional

## 2019-01-24 NOTE — Consult Note (Signed)
PHARMACY CONSULT NOTE  Pharmacy Consult for Electrolyte Monitoring and Replacement   Recent Labs: Potassium (mmol/L)  Date Value  01/24/2019 3.1 (L)  07/10/2013 3.6   Magnesium (mg/dL)  Date Value  01/24/2019 2.1  07/10/2013 1.5 (L)   Calcium (mg/dL)  Date Value  01/24/2019 8.2 (L)   Calcium, Total (mg/dL)  Date Value  07/10/2013 8.1 (L)   Albumin (g/dL)  Date Value  01/17/2019 2.2 (L)  06/15/2017 3.5 (L)  07/08/2013 1.7 (L)   Phosphorus (mg/dL)  Date Value  01/21/2019 3.3   Sodium (mmol/L)  Date Value  01/24/2019 135  12/24/2018 135  07/10/2013 134 (L)   Corrected Ca: 9.24 mg/dL  Assessment: 68 y.o. female with medical problems of chronic systolic congestive heart failure, coronary disease, atrial fibrillation requiring anticoagulation, diabetes, anemia, carotid artery disease with history of left carotid endarterectomy, previous stroke, hypertension, chronic kidney disease, was admitted on10/9/2020with shortness of breath worsening edema, acute kidney injury. This patient was started on a Lasix drip 10/12  Goal of Therapy:  Electrolytes WNL  Plan:   Replace potassium with KCl 52mEq IV x 3 doses  BMP, magnesium in am  Pearla Dubonnet ,PharmD Clinical Pharmacist 01/24/2019 9:43 AM

## 2019-01-24 NOTE — Progress Notes (Signed)
Progress Note  Patient Name: Meghan [name] Date of Encounter: 01/24/2019  Primary Cardiologist: Kathlyn Sacramento, MD   Subjective   Patient denies CP, palpitations, racing HR. She had a trial off of oxygen earlier today but is back on 2L [place] now. Her LEE is improved. She stated that GI intends to perform her EGD tomorrow.   Inpatient Medications    Scheduled Meds: . atorvastatin  40 mg Oral Daily  . Chlorhexidine Gluconate Cloth  6 each Topical Q0600  . furosemide  40 mg Intravenous BID  . [START ON 01/25/2019] hydrocortisone sod succinate (SOLU-CORTEF) inj  50 mg Intravenous Daily  . insulin aspart  0-15 Units Subcutaneous Q4H  . insulin aspart protamine- aspart  8 Units Subcutaneous TID WC  . insulin glargine  12 Units Subcutaneous QHS  . levothyroxine  50 mcg Oral QAC breakfast  . pantoprazole  40 mg Intravenous Q12H  . potassium chloride  20 mEq Oral BID  . sodium chloride flush  3 mL Intravenous Q12H   Continuous Infusions: . sodium chloride Stopped (01/17/19 1010)  . albumin human 12.5 g (01/24/19 1049)   PRN Meds: sodium chloride, acetaminophen, HYDROcodone-acetaminophen, ondansetron (ZOFRAN) IV, polyethylene glycol, sodium chloride flush   Vital Signs    Vitals:   01/24/19 0239 01/24/19 0538 01/24/19 0814 01/24/19 1138  BP:  111/90 124/84 99/65  Pulse:  100 91 92  Resp:  20 19 18   Temp:  (!) 97.4 F (36.3 C)  97.6 F (36.4 C)  TempSrc:  Oral  Oral  SpO2:  96% 96% 94%  Weight: 133.4 kg     Height:        Intake/Output Summary (Last 24 hours) at 01/24/2019 1557 Last data filed at 01/24/2019 1300 Gross per 24 hour  Intake 1040 ml  Output 700 ml  Net 340 ml   Last 3 Weights 01/24/2019 01/23/2019 01/22/2019  Weight (lbs) 294 lb 1.5 oz 292 lb 1.8 oz 294 lb 15.6 oz  Weight (kg) 133.4 kg 132.5 kg 133.8 kg      Telemetry    Atrial fibrillation with rates in the 80-low 100s, PVCs - Personally Reviewed  ECG    No new tracings- Personally Reviewed   Physical Exam   GEN: No acute distress.  Joined by daughter. Lying in the bed. Neck: No JVD though difficult to assess due to body habitus Cardiac:  irregular rhythm, regular rate, 2/6 systolic murmur, rubs, or gallops.  Respiratory: CTAB. GI: somewhat firm, nontender MS:1- 2+ b/l lower extremity edema; No deformity. Neuro:  Nonfocal  Psych: Normal affect   Labs    High Sensitivity Troponin:   Recent Labs  Lab 01/17/19 0011  TROPONINIHS 13      Cardiac EnzymesNo results for input(s): TROPONINI in the last 168 hours. No results for input(s): TROPIPOC in the last 168 hours.   Chemistry Recent Labs  Lab 01/22/19 0546 01/23/19 0523 01/24/19 0524  NA 133* 133* 135  K 3.4* 3.3* 3.1*  CL 94* 91* 92*  CO2 29 31 33*  GLUCOSE 342* 132* 158*  BUN 52* 47* 55*  CREATININE 2.15* 1.75* 1.83*  CALCIUM 7.8* 8.4* 8.2*  GFRNONAA 23* 29* 27*  GFRAA 26* 34* 32*  ANIONGAP 10 11 10      Hematology Recent Labs  Lab 01/21/19 0457 01/22/19 0546 01/23/19 0523 01/24/19 0524  WBC 5.2 5.9 10.1  --   RBC 2.89* 2.80* 3.04*  --   HGB 7.6* 7.4* 8.0* 8.1*  HCT 24.4* 23.6* 25.5*  --  MCV 84.4 84.3 83.9  --   MCH 26.3 26.4 26.3  --   MCHC 31.1 31.4 31.4  --   RDW 19.0* 19.3* 19.6*  --   PLT 206 188 208  --     BNPNo results for input(s): BNP, PROBNP in the last 168 hours.   DDimer No results for input(s): DDIMER in the last 168 hours.   Radiology    No results found.  Cardiac Studies   2D echo 12/2018: 1. The left ventricle has severely reduced systolic function, with an ejection fraction of 25-30%. The cavity size was mildly dilated. Left ventricular diastolic Doppler parameters are indeterminate. Left ventricular diffuse hypokinesis. 2. The right ventricle has mildly reduced systolic function. The cavity was moderately enlarged. There is no increase in right ventricular wall thickness. Right ventricular systolic pressure is moderately elevated with an estimated pressure of 56.2   mmHg. 3. Left atrial size was severely dilated. 4. Right atrial size was moderately dilated. 5. Tricuspid valve regurgitation is moderate. 6. Rhythm is atrial fibrillation  Patient Profile     68 y.o. female with a history of CAD, HFrEF secondary to ICM, prior hyperkalemia with spironolactone in the setting of renal failure, persistent atrial fibrillation s/p recent DCCV 01/03/2019 that was briefly successful on amiodarone and Coumadin, CVA, anemia, carotid artery disease s/p left-sided CEA, DM, hypertension, hyperlipidemia, and is being seen today for the evaluation of HFrEF  Assessment & Plan    Acute on chronic HFrEF secondary to ICM with likely cardiorenal syndrome --Plan to transition to oral diuretic given bump in renal function with most recent labs and as approaching her baseline weight.  --Given upcoming EGD tomorrow and NPO status overnight, will hold IV lasix for now and tentative plan to transition to torsemide 40 BID starting 10/17. --Monitor I/O. -14L for admission. Daily weights with baseline weight (per office notes) at 131.7kg and most recent admission weights stable at 133.4kg. --12/2018 Echo above with EF 25-30%.  --Beta-blocker precluded in the setting of low output heart failure. --Renal dysfunction precludes ACE/ARB/Entreso/Spironolactone.  Persistent atrial flutter/atrial fibrillation with RVR --Asymptomatic.S/p recent DCCV 01/03/2019, briefly held SR on amiodarone after cardioversion. Now back in Afib/flutter. --Rate control complicated by comorbid conditions. Beta-blocker therapy precluded in the setting of low output heart failure.  Unable to use digoxin due to current renal function.  Avoid CCB with low EF. Avoid IV amiodarone as she is not therapeutically anticoagulated with risk of thrombosis st ill associated with pharmacologic conversion. --Not currently on anticoagulation due to anemia / GIB. Continue to hold anticoagulation with close monitoring of CBC. --No  current plan for DCCV/TEE.  Not therapeutically anticoagulated.  CAD --Known severe CAD by catheterization 2018.  --Continue medical management as comorbid conditions allow.  GIB/blood loss anemia --Daily CBC. Transfusion recommended below 9. Per GI, EGD tentatively planned for tomorrow. NPO overnight.  AOCKDIII --Daily BMET. Bump overnight in Cr 1.75  1.83. BUN 47  55. Will transition to oral lasix as above and given NPO overnight. -Avoid nephrotoxins. Renally dose medications  Lower extremity edema --Likely multifactorial given anemia, hypoalbuminemia, improving volume status.  For questions or updates, please contact [facility] Please consult [email or web address] for contact info under        Signed, Arvil Chaco, PA-C  01/24/2019, 3:57 PM

## 2019-01-24 NOTE — Care Management Important Message (Signed)
Important Message  Patient Details  Name: Meghan [name] MRN: [id number] Date of Birth: Sep 03, 1950   Medicare Important Message Given:  Yes     Dannette Barbara 01/24/2019, 11:25 AM

## 2019-01-25 ENCOUNTER — Inpatient Hospital Stay: Payer: Medicare Other

## 2019-01-25 ENCOUNTER — Encounter
Admission: EM | Disposition: A | Discharge status: Home Health | Payer: Self-pay | Source: Home / Self Care | Attending: Internal Medicine

## 2019-01-25 ENCOUNTER — Inpatient Hospital Stay: Payer: Medicare Other | Admitting: Anesthesiology

## 2019-01-25 ENCOUNTER — Encounter: Payer: Self-pay | Admitting: Anesthesiology

## 2019-01-25 DIAGNOSIS — K921 Melena: Secondary | ICD-10-CM

## 2019-01-25 DIAGNOSIS — I5023 Acute on chronic systolic (congestive) heart failure: Secondary | ICD-10-CM | POA: Diagnosis not present

## 2019-01-25 DIAGNOSIS — R0602 Shortness of breath: Secondary | ICD-10-CM

## 2019-01-25 DIAGNOSIS — I482 Chronic atrial fibrillation, unspecified: Secondary | ICD-10-CM | POA: Diagnosis not present

## 2019-01-25 DIAGNOSIS — Z5309 Procedure and treatment not carried out because of other contraindication: Secondary | ICD-10-CM | POA: Diagnosis not present

## 2019-01-25 HISTORY — PX: ESOPHAGOGASTRODUODENOSCOPY (EGD) WITH PROPOFOL: SHX5813

## 2019-01-25 LAB — GLUCOSE, CAPILLARY
Glucose-Capillary: 101 mg/dL — ABNORMAL HIGH (ref 70–99)
Glucose-Capillary: 107 mg/dL — ABNORMAL HIGH (ref 70–99)
Glucose-Capillary: 87 mg/dL (ref 70–99)
Glucose-Capillary: 96 mg/dL (ref 70–99)
Glucose-Capillary: 95 mg/dL (ref 70–99)
Glucose-Capillary: 182 mg/dL — ABNORMAL HIGH (ref 70–99)
Glucose-Capillary: 184 mg/dL — ABNORMAL HIGH (ref 70–99)

## 2019-01-25 LAB — BASIC METABOLIC PANEL
Anion gap: 10 (ref 5–15)
BUN: 57 mg/dL — ABNORMAL HIGH (ref 8–23)
CO2: 33 mmol/L — ABNORMAL HIGH (ref 22–32)
Calcium: 8.3 mg/dL — ABNORMAL LOW (ref 8.9–10.3)
Chloride: 93 mmol/L — ABNORMAL LOW (ref 98–111)
Creatinine, Ser: 1.83 mg/dL — ABNORMAL HIGH (ref 0.44–1.00)
GFR calc Af Amer: 32 mL/min — ABNORMAL LOW (ref >60)
GFR calc non Af Amer: 27 mL/min — ABNORMAL LOW (ref >60)
Glucose, Bld: 95 mg/dL (ref 70–99)
Potassium: 3.3 mmol/L — ABNORMAL LOW (ref 3.5–5.1)
Sodium: 136 mmol/L (ref 135–145)

## 2019-01-25 LAB — HEMOGLOBIN AND HEMATOCRIT, BLOOD
HCT: 26.5 % — ABNORMAL LOW (ref 36.0–46.0)
HCT: 27.2 % — ABNORMAL LOW (ref 36.0–46.0)
Hemoglobin: 8.2 g/dL — ABNORMAL LOW (ref 12.0–15.0)
Hemoglobin: 8.3 g/dL — ABNORMAL LOW (ref 12.0–15.0)

## 2019-01-25 LAB — HEMOGLOBIN: Hemoglobin: 8 g/dL — ABNORMAL LOW (ref 12.0–15.0)

## 2019-01-25 SURGERY — EGD (ESOPHAGOGASTRODUODENOSCOPY)
Anesthesia: General

## 2019-01-25 SURGERY — ESOPHAGOGASTRODUODENOSCOPY (EGD) WITH PROPOFOL
Anesthesia: General

## 2019-01-25 MED ORDER — LIDOCAINE HCL (PF) 2 % IJ SOLN
INTRAMUSCULAR | Status: AC
  Filled 2019-01-25: qty 10

## 2019-01-25 MED ORDER — SODIUM CHLORIDE 0.9 % IV SOLN
INTRAVENOUS | Status: DC
  Administered 2019-01-25: 08:00:00 via INTRAVENOUS

## 2019-01-25 MED ORDER — FUROSEMIDE 10 MG/ML IJ SOLN: 40.0000 mg | Freq: Two times a day (BID) | INTRAMUSCULAR | Status: DC

## 2019-01-25 MED ORDER — INSULIN ASPART 100 UNIT/ML ~~LOC~~ SOLN
0.0000 [IU] | Freq: Every day | SUBCUTANEOUS | Status: DC
  Administered 2019-01-26: 2 [IU] via SUBCUTANEOUS
  Administered 2019-01-27 – 2019-01-28 (×2): 4 [IU] via SUBCUTANEOUS
  Administered 2019-01-29: 3 [IU] via SUBCUTANEOUS
  Administered 2019-01-30: 21:00:00 4 [IU] via SUBCUTANEOUS
  Administered 2019-01-31: 22:00:00 2 [IU] via SUBCUTANEOUS
  Administered 2019-02-01 – 2019-02-02 (×2): 4 [IU] via SUBCUTANEOUS
  Administered 2019-02-03: 5 [IU] via SUBCUTANEOUS
  Administered 2019-02-04: 3 [IU] via SUBCUTANEOUS
  Administered 2019-02-05: 5 [IU] via SUBCUTANEOUS
  Administered 2019-02-06: 21:00:00 3 [IU] via SUBCUTANEOUS
  Filled 2019-01-25 (×12): qty 1

## 2019-01-25 MED ORDER — EPHEDRINE SULFATE 50 MG/ML IJ SOLN
INTRAMUSCULAR | Status: DC | PRN
  Administered 2019-01-25: 10 mg via INTRAVENOUS

## 2019-01-25 MED ORDER — PROPOFOL 10 MG/ML IV BOLUS
INTRAVENOUS | Status: DC | PRN
  Administered 2019-01-25: 80 mg via INTRAVENOUS

## 2019-01-25 MED ORDER — POTASSIUM CHLORIDE CRYS ER 20 MEQ PO TBCR
20.0000 meq | EXTENDED_RELEASE_TABLET | Freq: Once | ORAL | Status: AC
  Administered 2019-01-25: 20 meq via ORAL
  Filled 2019-01-25: qty 1

## 2019-01-25 MED ORDER — PERMETHRIN 5 % EX CREA
TOPICAL_CREAM | Freq: Once | CUTANEOUS | Status: AC
  Administered 2019-01-25: 15:00:00 via TOPICAL
  Filled 2019-01-25: qty 60

## 2019-01-25 MED ORDER — LIDOCAINE HCL (CARDIAC) PF 100 MG/5ML IV SOSY
PREFILLED_SYRINGE | INTRAVENOUS | Status: DC | PRN
  Administered 2019-01-25: 100 mg via INTRAVENOUS

## 2019-01-25 MED ORDER — FUROSEMIDE 10 MG/ML IJ SOLN
INTRAMUSCULAR | Status: AC
  Filled 2019-01-25: qty 8

## 2019-01-25 MED ORDER — FUROSEMIDE 10 MG/ML IJ SOLN: 60.0000 mg | Freq: Two times a day (BID) | INTRAMUSCULAR | Status: DC

## 2019-01-25 MED ORDER — PROPOFOL 500 MG/50ML IV EMUL
INTRAVENOUS | Status: AC
  Filled 2019-01-25: qty 50

## 2019-01-25 MED ORDER — FUROSEMIDE 10 MG/ML IJ SOLN
60.0000 mg | Freq: Two times a day (BID) | INTRAMUSCULAR | Status: DC
  Administered 2019-01-25 – 2019-01-26 (×2): 60 mg via INTRAVENOUS
  Filled 2019-01-25 (×2): qty 8

## 2019-01-25 MED ORDER — INSULIN ASPART 100 UNIT/ML ~~LOC~~ SOLN
0.0000 [IU] | Freq: Three times a day (TID) | SUBCUTANEOUS | Status: DC
  Administered 2019-01-26: 2 [IU] via SUBCUTANEOUS
  Administered 2019-01-26: 5 [IU] via SUBCUTANEOUS
  Administered 2019-01-27: 1 [IU] via SUBCUTANEOUS
  Administered 2019-01-27 (×2): 3 [IU] via SUBCUTANEOUS
  Administered 2019-01-28: 2 [IU] via SUBCUTANEOUS
  Administered 2019-01-28: 3 [IU] via SUBCUTANEOUS
  Administered 2019-01-28: 7 [IU] via SUBCUTANEOUS
  Administered 2019-01-29 (×3): 2 [IU] via SUBCUTANEOUS
  Administered 2019-01-30 (×3): 3 [IU] via SUBCUTANEOUS
  Administered 2019-01-31: 2 [IU] via SUBCUTANEOUS
  Administered 2019-01-31 (×2): 5 [IU] via SUBCUTANEOUS
  Administered 2019-02-01: 08:00:00 2 [IU] via SUBCUTANEOUS
  Administered 2019-02-01: 17:00:00 7 [IU] via SUBCUTANEOUS
  Administered 2019-02-01: 12:00:00 3 [IU] via SUBCUTANEOUS
  Administered 2019-02-02: 2 [IU] via SUBCUTANEOUS
  Administered 2019-02-02: 5 [IU] via SUBCUTANEOUS
  Administered 2019-02-02: 2 [IU] via SUBCUTANEOUS
  Administered 2019-02-03: 1 [IU] via SUBCUTANEOUS
  Administered 2019-02-03: 7 [IU] via SUBCUTANEOUS
  Administered 2019-02-03: 12:00:00 3 [IU] via SUBCUTANEOUS
  Administered 2019-02-04: 9 [IU] via SUBCUTANEOUS
  Administered 2019-02-04: 5 [IU] via SUBCUTANEOUS
  Administered 2019-02-05: 16:00:00 9 [IU] via SUBCUTANEOUS
  Administered 2019-02-05: 3 [IU] via SUBCUTANEOUS
  Administered 2019-02-05: 2 [IU] via SUBCUTANEOUS
  Administered 2019-02-06 (×2): 5 [IU] via SUBCUTANEOUS
  Administered 2019-02-06: 08:00:00 3 [IU] via SUBCUTANEOUS
  Administered 2019-02-07: 5 [IU] via SUBCUTANEOUS
  Administered 2019-02-07: 18:00:00 9 [IU] via SUBCUTANEOUS
  Administered 2019-02-08: 1 [IU] via SUBCUTANEOUS
  Filled 2019-01-25 (×35): qty 1

## 2019-01-25 MED ORDER — WARFARIN SODIUM 4 MG PO TABS
4.0000 mg | ORAL_TABLET | Freq: Once | ORAL | Status: AC
  Administered 2019-01-25: 4 mg via ORAL
  Filled 2019-01-25: qty 1

## 2019-01-25 MED ORDER — WARFARIN - PHARMACIST DOSING INPATIENT
Freq: Every day | Status: DC
  Administered 2019-01-25 – 2019-01-26 (×2)

## 2019-01-25 NOTE — Progress Notes (Signed)
Meghan Antigua, MD [address] [place], [place], [place], [address] [address], [place], [place], [place], [address] Phone: [phone]  Fax: [phone]   Subjective: Patient denies any further episodes of melena since yesterday morning.  No abdominal pain   Objective: Exam: Vital signs in last 24 hours: Vitals:   01/24/19 0814 01/24/19 1138 01/24/19 2009 01/25/19 0525  BP: 124/84 99/65 126/78 104/65  Pulse: 91 92 77 75  Resp: 19 18 18 16   Temp:  97.6 F (36.4 C) 98.6 F (37 C) (!) 97.5 F (36.4 C)  TempSrc:  Oral Oral Axillary  SpO2: 96% 94% 99% 100%  Weight:    133.8 kg  Height:       Weight change: 0.4 kg  Intake/Output Summary (Last 24 hours) at 01/25/2019 0810 Last data filed at 01/25/2019 [id number] Gross per 24 hour  Intake 851.12 ml  Output 1350 ml  Net -498.88 ml    General: No acute distress, AAO x3 Abd: Soft, NT/ND, No HSM Skin: Warm, no rashes Neck: Supple, Trachea midline   Lab Results: Lab Results  Component Value Date   WBC 10.1 01/23/2019   HGB 8.0 (L) 01/25/2019   HCT 25.5 (L) 01/23/2019   MCV 83.9 01/23/2019   PLT 208 01/23/2019   Micro Results: Recent Results (from the past 240 hour(s))  SARS Coronavirus 2 by RT PCR (hospital order, performed in [place] hospital lab) Nasopharyngeal Nasopharyngeal Swab     Status: None   Collection Time: 01/17/19  1:41 AM   Specimen: Nasopharyngeal Swab  Result Value Ref Range Status   SARS Coronavirus 2 NEGATIVE NEGATIVE Final    Comment: (NOTE) If result is NEGATIVE SARS-CoV-2 target nucleic acids are NOT DETECTED. The SARS-CoV-2 RNA is generally detectable in upper and lower  respiratory specimens during the acute phase of infection. The lowest  concentration of SARS-CoV-2 viral copies this assay can detect is 250  copies / mL. A negative result does not preclude SARS-CoV-2 infection  and should not be used as the sole basis for treatment or other  patient management decisions.  A  negative result may occur with  improper specimen collection / handling, submission of specimen other  than nasopharyngeal swab, presence of viral mutation(s) within the  areas targeted by this assay, and inadequate number of viral copies  (<250 copies / mL). A negative result must be combined with clinical  observations, patient history, and epidemiological information. If result is POSITIVE SARS-CoV-2 target nucleic acids are DETECTED. The SARS-CoV-2 RNA is generally detectable in upper and lower  respiratory specimens dur ing the acute phase of infection.  Positive  results are indicative of active infection with SARS-CoV-2.  Clinical  correlation with patient history and other diagnostic information is  necessary to determine patient infection status.  Positive results do  not rule out bacterial infection or co-infection with other viruses. If result is PRESUMPTIVE POSTIVE SARS-CoV-2 nucleic acids MAY BE PRESENT.   A presumptive positive result was obtained on the submitted specimen  and confirmed on repeat testing.  While 2019 novel coronavirus  (SARS-CoV-2) nucleic acids may be present in the submitted sample  additional confirmatory testing may be necessary for epidemiological  and / or clinical management purposes  to differentiate between  SARS-CoV-2 and other Sarbecovirus currently known to infect humans.  If clinically indicated additional testing with an alternate test  methodology [phone]) is advised. The SARS-CoV-2 RNA is generally  detectable in upper and lower respiratory sp ecimens during  the acute  phase of infection. The expected result is Negative. Fact Sheet for Patients:  StrictlyIdeas.no Fact Sheet for Healthcare Providers: BankingDealers.co.za This test is not yet approved or cleared by the [place] FDA and has been authorized for detection and/or diagnosis of SARS-CoV-2 by FDA under an Emergency Use  Authorization (EUA).  This EUA will remain in effect (meaning this test can be used) for the duration of the COVID-19 declaration under Section 564(b)(1) of the Act, 21 U.S.C. section 360bbb-3(b)(1), unless the authorization is terminated or revoked sooner. Performed at [facility], [address] [place], [place] [address]   MRSA PCR Screening     Status: None   Collection Time: 01/17/19  4:44 AM   Specimen: Nasal Mucosa; Nasopharyngeal  Result Value Ref Range Status   MRSA by PCR NEGATIVE NEGATIVE Final    Comment:        The GeneXpert MRSA Assay (FDA approved for NASAL specimens only), is one component of a comprehensive MRSA colonization surveillance program. It is not intended to diagnose MRSA infection nor to guide or monitor treatment for MRSA infections. Performed at [facility], [address] [place], [place] [address]   Urine Culture     Status: Abnormal   Collection Time: 01/17/19 11:52 AM   Specimen: Urine, Clean Catch  Result Value Ref Range Status   Specimen Description   Final    URINE, CLEAN CATCH Performed at [facility], [address] [place], [place] [address]    Special Requests   Final    Normal Performed at [facility], [place],  [address]    Culture >=100,000 COLONIES/mL ESCHERICHIA COLI (A)  Final   Report Status 01/19/2019 FINAL  Final   Organism ID, Bacteria ESCHERICHIA COLI (A)  Final      Susceptibility   Escherichia coli - MIC*    AMPICILLIN >=32 RESISTANT Resistant     CEFAZOLIN <=4 SENSITIVE Sensitive     CEFTRIAXONE <=1 SENSITIVE Sensitive     CIPROFLOXACIN >=4 RESISTANT Resistant     GENTAMICIN <=1 SENSITIVE Sensitive     IMIPENEM <=0.25 SENSITIVE Sensitive     NITROFURANTOIN <=16 SENSITIVE Sensitive     TRIMETH/SULFA <=20 SENSITIVE Sensitive     AMPICILLIN/SULBACTAM 8 SENSITIVE Sensitive     PIP/TAZO <=4 SENSITIVE Sensitive     Extended ESBL NEGATIVE Sensitive      * >=100,000 COLONIES/mL ESCHERICHIA COLI   Studies/Results: No results found. Medications:  Scheduled Meds:  [MAR Hold] atorvastatin  40 mg Oral Daily   [MAR Hold] Chlorhexidine Gluconate Cloth  6 each Topical Q0600   [MAR Hold] hydrocortisone sod succinate (SOLU-CORTEF) inj  50 mg Intravenous Daily   [MAR Hold] insulin aspart  0-15 Units Subcutaneous Q4H   [MAR Hold] insulin aspart protamine- aspart  8 Units Subcutaneous TID WC   [MAR Hold] insulin glargine  12 Units Subcutaneous QHS   [MAR Hold] levothyroxine  50 mcg Oral QAC breakfast   [MAR Hold] pantoprazole  40 mg Intravenous Q12H   [MAR Hold] potassium chloride  20 mEq Oral BID   [MAR Hold] potassium chloride  20 mEq Oral Once   [MAR Hold] sodium chloride flush  3 mL Intravenous Q12H   [MAR Hold] torsemide  40 mg Oral BID   Continuous Infusions:  [MAR Hold] sodium chloride Stopped (01/17/19 1010)   sodium chloride 20 mL/hr at 01/25/19 0752   [MAR Hold] albumin human 12.5 g (01/24/19 1049)   PRN  Meds:.[MAR Hold] sodium chloride, [MAR Hold] acetaminophen, [MAR Hold] HYDROcodone-acetaminophen, [MAR Hold] ondansetron (ZOFRAN) IV, [MAR Hold] polyethylene glycol, [MAR Hold] sodium chloride flush   Assessment: Active Problems:   Acute on chronic systolic CHF (congestive heart failure) ([place]) Melena   Plan: Proceed with upper endoscopy today to evaluate etiology of melena that occurred yesterday PPI IV twice daily  Continue serial CBCs and transfuse PRN Avoid NSAIDs Maintain 2 large-bore IV lines Please page GI with any acute hemodynamic changes, or signs of active GI bleeding  I have discussed alternative options, risks & benefits,  which include, but are not limited to, bleeding, infection, perforation,respiratory complication & drug reaction.  The patient agrees with this plan & written consent will be obtained.       LOS: 8 days   Meghan Antigua, MD 01/25/2019, 8:10 AM

## 2019-01-25 NOTE — Progress Notes (Signed)
Pt noted to have head lice by this RN. MD aware and ordered on time treatment. Per policy,if bugs are not totally eradicated,to repeat treatment per manufacturers recommendations.

## 2019-01-25 NOTE — Progress Notes (Signed)
[facility], [place] 01/25/19  Subjective:  Patient resting comfortably in bed. She reports increasing edema after coming off of Lasix drip. Creatinine currently 1.8.  Objective:  Vital signs in last 24 hours:  Temp:  [97.2 F (36.2 C)-98.6 F (37 C)] 97.4 F (36.3 C) (10/17 1239) Pulse Rate:  [51-119] 51 (10/17 1239) Resp:  [14-22] 18 (10/17 0947) BP: (91-126)/(63-82) 108/75 (10/17 1239) SpO2:  [93 %-100 %] 96 % (10/17 1239) Weight:  [133.8 kg] 133.8 kg (10/17 0525)  Weight change: 0.4 kg Filed Weights   01/23/19 0500 01/24/19 0239 01/25/19 0525  Weight: 132.5 kg 133.4 kg 133.8 kg    Intake/Output:    Intake/Output Summary (Last 24 hours) at 01/25/2019 1616 Last data filed at 01/25/2019 [id number] Gross per 24 hour  Intake 401.12 ml  Output 1350 ml  Net -948.88 ml    Physical Exam: General:  No acute distress, obese female, laying in the bed  HEENT  moist oral mucous membranes  Neck:  Supple  Lungs:  Clear bilateral, normal effort  Heart::  S1S2 no rubs  Abdomen:  Soft, nontender  Extremities:  2+ pitting edema  Neurologic:  Alert, able to answer questions  Skin:  Warm, dry  Foley in place    Basic Metabolic Panel:  Recent Labs  Lab 01/21/19 0457 01/22/19 0546 01/23/19 0523 01/24/19 0524 01/25/19 0442  NA 134* 133* 133* 135 136  K 3.5 3.4* 3.3* 3.1* 3.3*  CL 97* 94* 91* 92* 93*  CO2 28 29 31  33* 33*  GLUCOSE 166* 342* 132* 158* 95  BUN 53* 52* 47* 55* 57*  CREATININE 2.48* 2.15* 1.75* 1.83* 1.83*  CALCIUM 7.8* 7.8* 8.4* 8.2* 8.3*  MG 2.2  --  2.0 2.1  --   PHOS 3.3  --   --   --   --      CBC: Recent Labs  Lab 01/19/19 2053 01/20/19 0510 01/20/19 1313 01/21/19 0457 01/22/19 0546 01/23/19 0523 01/24/19 0524 01/24/19 1719 01/25/19 0442 01/25/19 1405  WBC 5.5 5.4  --  5.2 5.9 10.1  --   --   --   --   NEUTROABS  --   --   --  3.3  --  8.7*  --   --   --   --   HGB 7.1* 6.9* 7.5* 7.6* 7.4* 8.0* 8.1* 8.3* 8.0*  8.2*  HCT 23.4* 22.8* 23.8* 24.4* 23.6* 25.5*  --   --   --  26.5*  MCV 84.8 84.4  --  84.4 84.3 83.9  --   --   --   --   PLT 176 195  --  206 188 208  --   --   --   --      No results found for: HEPBSAG, HEPBSAB, HEPBIGM    Microbiology:  Recent Results (from the past 240 hour(s))  SARS Coronavirus 2 by RT PCR (hospital order, performed in [place] hospital lab) Nasopharyngeal Nasopharyngeal Swab     Status: None   Collection Time: 01/17/19  1:41 AM   Specimen: Nasopharyngeal Swab  Result Value Ref Range Status   SARS Coronavirus 2 NEGATIVE NEGATIVE Final    Comment: (NOTE) If result is NEGATIVE SARS-CoV-2 target nucleic acids are NOT DETECTED. The SARS-CoV-2 RNA is generally detectable in upper and lower  respiratory specimens during the acute phase of infection. The lowest  concentration of SARS-CoV-2 viral copies this assay can detect is 250  copies / mL.  A negative result does not preclude SARS-CoV-2 infection  and should not be used as the sole basis for treatment or other  patient management decisions.  A negative result may occur with  improper specimen collection / handling, submission of specimen other  than nasopharyngeal swab, presence of viral mutation(s) within the  areas targeted by this assay, and inadequate number of viral copies  (<250 copies / mL). A negative result must be combined with clinical  observations, patient history, and epidemiological information. If result is POSITIVE SARS-CoV-2 target nucleic acids are DETECTED. The SARS-CoV-2 RNA is generally detectable in upper and lower  respiratory specimens dur ing the acute phase of infection.  Positive  results are indicative of active infection with SARS-CoV-2.  Clinical  correlation with patient history and other diagnostic information is  necessary to determine patient infection status.  Positive results do  not rule out bacterial infection or co-infection with other viruses. If result is  PRESUMPTIVE POSTIVE SARS-CoV-2 nucleic acids MAY BE PRESENT.   A presumptive positive result was obtained on the submitted specimen  and confirmed on repeat testing.  While 2019 novel coronavirus  (SARS-CoV-2) nucleic acids may be present in the submitted sample  additional confirmatory testing may be necessary for epidemiological  and / or clinical management purposes  to differentiate between  SARS-CoV-2 and other Sarbecovirus currently known to infect humans.  If clinically indicated additional testing with an alternate test  methodology [phone]) is advised. The SARS-CoV-2 RNA is generally  detectable in upper and lower respiratory sp ecimens during the acute  phase of infection. The expected result is Negative. Fact Sheet for Patients:  StrictlyIdeas.no Fact Sheet for Healthcare Providers: BankingDealers.co.za This test is not yet approved or cleared by the [place] FDA and has been authorized for detection and/or diagnosis of SARS-CoV-2 by FDA under an Emergency Use Authorization (EUA).  This EUA will remain in effect (meaning this test can be used) for the duration of the COVID-19 declaration under Section 564(b)(1) of the Act, 21 U.S.C. section 360bbb-3(b)(1), unless the authorization is terminated or revoked sooner. Performed at [facility], [address] [place], [place] [address]   MRSA PCR Screening     Status: None   Collection Time: 01/17/19  4:44 AM   Specimen: Nasal Mucosa; Nasopharyngeal  Result Value Ref Range Status   MRSA by PCR NEGATIVE NEGATIVE Final    Comment:        The GeneXpert MRSA Assay (FDA approved for NASAL specimens only), is one component of a comprehensive MRSA colonization surveillance program. It is not intended to diagnose MRSA infection nor to guide or monitor treatment for MRSA infections. Performed at [facility], [address] [place], [place] [address]    Urine Culture     Status: Abnormal   Collection Time: 01/17/19 11:52 AM   Specimen: Urine, Clean Catch  Result Value Ref Range Status   Specimen Description   Final    URINE, CLEAN CATCH Performed at [facility], [address] [place], [place] [address]    Special Requests   Final    Normal Performed at [facility], [address] [place], [place] [address]    Culture >=100,000 COLONIES/mL ESCHERICHIA COLI (A)  Final   Report Status 01/19/2019 FINAL  Final   Organism ID, Bacteria ESCHERICHIA COLI (A)  Final      Susceptibility   Escherichia coli - MIC*    AMPICILLIN >=32 RESISTANT Resistant  CEFAZOLIN <=4 SENSITIVE Sensitive     CEFTRIAXONE <=1 SENSITIVE Sensitive     CIPROFLOXACIN >=4 RESISTANT Resistant     GENTAMICIN <=1 SENSITIVE Sensitive     IMIPENEM <=0.25 SENSITIVE Sensitive     NITROFURANTOIN <=16 SENSITIVE Sensitive     TRIMETH/SULFA <=20 SENSITIVE Sensitive     AMPICILLIN/SULBACTAM 8 SENSITIVE Sensitive     PIP/TAZO <=4 SENSITIVE Sensitive     Extended ESBL NEGATIVE Sensitive     * >=100,000 COLONIES/mL ESCHERICHIA COLI    Coagulation Studies: Recent Labs    01/24/19 1719  LABPROT 14.9  INR 1.2    Urinalysis: No results for input(s): COLORURINE, LABSPEC, PHURINE, GLUCOSEU, HGBUR, BILIRUBINUR, KETONESUR, PROTEINUR, UROBILINOGEN, NITRITE, LEUKOCYTESUR in the last 72 hours.  Invalid input(s): APPERANCEUR    Imaging: Dg Chest Port 1 View  Result Date: 01/25/2019 CLINICAL DATA:  68 year old female with history of shortness of breath and hypoxia. EXAM: PORTABLE CHEST 1 VIEW COMPARISON:  Chest x-ray 01/20/2019. FINDINGS: Lung volumes are normal. No consolidative airspace disease. No pleural effusions. No pneumothorax. No definite suspicious appearing pulmonary nodules or masses are noted. Cephalization of the pulmonary vasculature. Moderate cardiomegaly. The patient is rotated to the left on today's exam, resulting in distortion of  the mediastinal contours and reduced diagnostic sensitivity and specificity for mediastinal pathology. Aortic atherosclerosis. IMPRESSION: 1. Cardiomegaly with pulmonary venous congestion, but no frank pulmonary edema. Electronically Signed   By: Vinnie Langton M.D.   On: 01/25/2019 15:29     Medications:    Current Facility-Administered Medications (Endocrine & Metabolic):  .  hydrocortisone sodium succinate (SOLU-CORTEF) 100 MG injection 50 mg .  insulin aspart (novoLOG) injection 0-15 Units .  insulin aspart protamine- aspart (NOVOLOG MIX 70/30) injection 8 Units .  insulin glargine (LANTUS) injection 12 Units .  levothyroxine (SYNTHROID) tablet 50 mcg   Current Facility-Administered Medications (Cardiovascular):  .  atorvastatin (LIPITOR) tablet 40 mg .  furosemide (LASIX) 10 MG/ML injection .  furosemide (LASIX) injection 60 mg     Current Facility-Administered Medications (Analgesics):  .  acetaminophen (TYLENOL) tablet 650 mg .  HYDROcodone-acetaminophen (NORCO/VICODIN) 5-325 MG per tablet 1-2 tablet   Current Facility-Administered Medications (Hematological):  .  albumin human 25 % solution 12.5 g .  warfarin (COUMADIN) tablet 4 mg   Current Facility-Administered Medications (Other):  .  0.9 %  sodium chloride infusion .  Chlorhexidine Gluconate Cloth 2 % PADS 6 each .  ondansetron (ZOFRAN) injection 4 mg .  pantoprazole (PROTONIX) injection 40 mg .  polyethylene glycol (MIRALAX / GLYCOLAX) packet 17 g .  potassium chloride SA (KLOR-CON) CR tablet 20 mEq .  sodium chloride flush (NS) 0.9 % injection 3 mL .  sodium chloride flush (NS) 0.9 % injection 3 mL .  Warfarin - Pharmacist Dosing Inpatient  No current outpatient medications on file.    Assessment/ Plan:  68 y.o. female with  medical problems of chronic systolic congestive heart failure, coronary disease, atrial fibrillation requiring anticoagulation, diabetes, anemia, carotid artery disease with  history of left carotid endarterectomy, previous stroke, hypertension, chronic kidney disease, was admitted on 01/17/2019 with shortness of breath worsening edema, acute kidney injury.   1.  Acute kidney injury 2.  Chronic kidney disease stage III 3.  Shortness of breath 4.  Generalized edema 5.  Severe anemia 6.  Diabetes, insulin-dependent.  Hemoglobin A1c 7.4% December 14, 2018 7.  Hematuria 8.  Proteinuria 9.  Severe chronic systolic congestive heart failure LVEF 25 to 30% 10.  Pulmonary hypertension 11.  Atrial fibrillation requiring anticoagulation 12.  Hyponatremia 13. UTI - E Coli 01/17/2019    Acute kidney injury is likely secondary to cardiorenal syndrome.  Patient has relative hypotension which may be leading to renal hypoperfusion.  She has underlying chronic kidney disease secondary to diabetes and hypertension.  Patient is also noted to have severe chronic congestive heart failure and hypoalbuminemia.  Recommend: -Patient reports increasing edema.  Increase furosemide to 60 mg IV twice daily.  No need for torsemide at the same time.  Continue potassium chloride 20 mEq twice daily as well.  Further plan as per hospitalist.  LOS: 8 Milus Fritze Blanford 10/17/20204:16 PM  [place], [place]  Note: This note was prepared with Dragon dictation. Any transcription errors are unintentional

## 2019-01-25 NOTE — Consult Note (Addendum)
PHARMACY CONSULT NOTE  Pharmacy Consult for Electrolyte Monitoring and Replacement   Recent Labs: Potassium (mmol/L)  Date Value  01/25/2019 3.3 (L)  07/10/2013 3.6   Magnesium (mg/dL)  Date Value  01/24/2019 2.1  07/10/2013 1.5 (L)   Calcium (mg/dL)  Date Value  01/25/2019 8.3 (L)   Calcium, Total (mg/dL)  Date Value  07/10/2013 8.1 (L)   Albumin (g/dL)  Date Value  01/17/2019 2.2 (L)  06/15/2017 3.5 (L)  07/08/2013 1.7 (L)   Phosphorus (mg/dL)  Date Value  01/21/2019 3.3   Sodium (mmol/L)  Date Value  01/25/2019 136  12/24/2018 135  07/10/2013 134 (L)   Corrected Ca: 9.24 mg/dL  Assessment: 68 y.o. female with medical problems of chronic systolic congestive heart failure, coronary disease, atrial fibrillation requiring anticoagulation, diabetes, anemia, carotid artery disease with history of left carotid endarterectomy, previous stroke, hypertension, chronic kidney disease, was admitted on10/9/2020with shortness of breath worsening edema, acute kidney injury. This patient was started on a Lasix drip 10/12  Goal of Therapy:  Electrolytes WNL  Plan:  Potassium increased from 3.1 to 3.3 yesterday despite 70 mEq potassium. Patient on potassium 20 mEq PO BID. Will add an additional 20 mEq PO x 1 at 1300 for a total of 60 mEq today.  Tawnya Crook ,PharmD Clinical Pharmacist 01/25/2019 7:22 AM

## 2019-01-25 NOTE — Op Note (Signed)
[facility] Patient Name: Meghan [name] Procedure Date: 01/25/2019 8:20 AM MRN: [id number] Account #: [id number] Date of Birth: 03-11-1951 Admit Type: Inpatient Age: 68 Room: [facility] ENDO ROOM 4 Gender: Female Note Status: Finalized Procedure:            Upper GI endoscopy Indications:          Melena Providers:            Layliana Devins B. Bonna Gains MD, MD Referring MD:         [name], MD (Referring MD) Medicines:            General Anesthesia Complications:        No immediate complications. Procedure:            Pre-Anesthesia Assessment:                       - Prior to the procedure, a History and Physical was                        performed, and patient medications and allergies were                        reviewed. The patient is competent. The risks and                        benefits of the procedure and the sedation options and                        risks were discussed with the patient. All questions                        were answered and informed consent was obtained.                        Patient identification and proposed procedure were                        verified by the physician, the nurse, the                        anesthesiologist, the anesthetist and the technician in                        the pre-procedure area in the procedure room in the                        endoscopy suite. Mental Status Examination: alert and                        oriented. Airway Examination: normal oropharyngeal                        airway and neck mobility. Respiratory Examination:                        clear to auscultation. CV Examination: normal.                        Prophylactic Antibiotics: The patient does not require  prophylactic antibiotics. Prior Anticoagulants: The                        patient has taken no previous anticoagulant or                        antiplatelet agents. ASA Grade Assessment: III -  A                        patient with severe systemic disease. After reviewing                        the risks and benefits, the patient was deemed in                        satisfactory condition to undergo the procedure. The                        anesthesia plan was to use general anesthesia.                        Immediately prior to administration of medications, the                        patient was re-assessed for adequacy to receive                        sedatives. The heart rate, respiratory rate, oxygen                        saturations, blood pressure, adequacy of pulmonary                        ventilation, and response to care were monitored                        throughout the procedure. The physical status of the                        patient was re-assessed after the procedure.                       After obtaining informed consent, the endoscope was                        passed under direct vision. Throughout the procedure,                        the patient's blood pressure, pulse, and oxygen                        saturations were monitored continuously. The procedure                        was aborted. The scope was not inserted into the                        esophagus. It was inserted into the mouth, but due to  hypoxia it was withdrawn. Medications were given. The                        procedure was aborted due to the patient's respiratory                        instability (hypoxia). Decreasing the dose of sedation                        medication, Anesthesia staff assisting with sedation,                        managing the patient's medical instability and                        performing chin lift did not allow for the successful                        completion of the procedure. The procedure was aborted                        due to hypoxia. Vitals stabilized with anesthesia                        maneuvers and stopping  sedation. Findings:      Scope was not able to be advanced into the esophagus due to hypoxia and       had to be aborted immediately after sedation was started. Anesthesia       staff ventilated the patient with mask and hypoxia resolved. Based on       discussion with anesthesia staff, it was not considered safe to continue       to proceed with the procedure. Impression:           - The procedure was aborted due to the patient's                        respiratory instability (hypoxia).                       - The procedure was aborted due to hypoxia. Vitals                        stabilized with anesthesia maneuvers and stopping                        sedation.                       - No specimens collected. Recommendation:       - Melena has not reoccured in 24 hours. Hemoglobin at                        baseline today and yesterday. In the absence of active                        GI bleeding at this time, and stable labs, and hypoxia                        with light sedation today, would not recommend repeat  endoscopy at this time. Risks of procedure outweigh                        benefits at this time                       -If rebleeding occurs, obtain CTA or RBC scan at the                        time of active bleeding                       -Continue PPI BID                       -Serial CBCs and transfuse PRN                       -Endoscopic procedures to be done at a later time as an                        inpatient or outpatient when patient is better                        medically optimized for procedures.                       - The findings and recommendations were discussed with                        the patient.                       - The findings and recommendations were discussed with                        the patient's family.                       - Return to GI clinic in 2 weeks.                       - Return to primary care physician  in 2 weeks.                       - Avoid NSAIDs except Aspirin if medically indicated by                        PCP Diagnosis Code(s):    --- Professional ---                       Z53.09, Procedure and treatment not carried out because                        of other contraindication                       K92.1, Melena (includes Hematochezia)  Vonda Antigua, MD Prakriti Carignan B. Bonna Gains MD, MD 01/25/2019 8:56:03 AM This report has been signed electronically. Number of Addenda: 0 Note Initiated On: 01/25/2019 8:20 AM      [facility]

## 2019-01-25 NOTE — Anesthesia Post-op Follow-up Note (Signed)
Anesthesia QCDR form completed.        

## 2019-01-25 NOTE — Anesthesia Preprocedure Evaluation (Signed)
Anesthesia Evaluation  Patient identified by MRN, date of birth, ID band Patient awake    Reviewed: Allergy & Precautions, NPO status , Patient's Chart, lab work & pertinent test results  History of Anesthesia Complications Negative for: history of anesthetic complications  Airway Mallampati: III       Dental  (+) Poor Dentition, Missing, Chipped, Dental Advidsory Given   Pulmonary shortness of breath (on O2 right now) and with exertion, neg sleep apnea, neg COPD, neg recent URI, Not current smoker, former smoker,           Cardiovascular hypertension, Pt. on medications (-) angina+ CAD and +CHF (EF 25-30%)  (-) Cardiac Stents + dysrhythmias Atrial Fibrillation (-) Valvular Problems/Murmurs     Neuro/Psych neg Seizures CVA (R sided weakness, resolved)    GI/Hepatic Neg liver ROS, neg GERD  ,  Endo/Other  diabetes, Type 2, Oral Hypoglycemic AgentsMorbid obesity  Renal/GU Renal InsufficiencyRenal disease     Musculoskeletal   Abdominal   Peds  Hematology   Anesthesia Other Findings Past Medical History: No date: Carotid arterial disease ([place])     Comment:  a. 02/2012 s/p L CEA. No date: CKD stage G3b/A1, GFR 30-44 and albumin creatinine ratio <30  mg/g No date: Coronary artery disease     Comment:  a. 02/2012 Lexi MV: Anterior, anteroseptal, septal,               apical infarct with mild peri-infarct ischemia, EF 41%-               medically managed due to anemia; b. 03/2017 Cath: LM nl,               LAD 99ost/p/m - faint L->L collaterals, RI 20ost, LCX               39m, RCA 20p, 55m-->Med Rx for chronic subtotal LAD dzs.               Avoid R Radial access in future. No date: Diabetes mellitus without complication (HCC)     Comment:  Type II No date: Hyperlipidemia No date: Hypertension No date: Ischemic cardiomyopathy     Comment:  a. 06/2013 Echo: EF 35-40%, glob HK, mildly dil LA, mild               LVH,  mild TR, mild to mod MR; b. 07/2016 Echo: EF 25-30%,               sev mid-apicalanteroseptal, ant, apical HK, mild MR, sev               dil LA, mod dil RA, mod TR, PASP 8mmHg; c. 11/2016 Echo:               EF 20-25%, antsept DK, Gr2 DD, mild MR, mod dil LA/RV/RA,              mod red RV fxn, mod-sev TR, PASP 40-71mmHg. No date: Morbid obesity ([place]) No date: Normocytic anemia No date: Persistent atrial fibrillation ([place])     Comment:  a. CHA2DS2VASc = 7-->coumadin; maintaining sinus on               amio. 2013: Stroke [facility])   Reproductive/Obstetrics                             Anesthesia Physical  Anesthesia Plan  ASA: III  Anesthesia Plan: General   Post-op Pain Management:  Induction: Intravenous  PONV Risk Score and Plan: Propofol infusion, TIVA and Treatment may vary due to age or medical condition  Airway Management Planned: Nasal Cannula and Natural Airway  Additional Equipment:   Intra-op Plan:   Post-operative Plan:   Informed Consent: I have reviewed the patients History and Physical, chart, labs and discussed the procedure including the risks, benefits and alternatives for the proposed anesthesia with the patient or authorized representative who has indicated his/her understanding and acceptance.       Plan Discussed with:   Anesthesia Plan Comments:         Anesthesia Quick Evaluation

## 2019-01-25 NOTE — Consult Note (Signed)
ANTICOAGULATION CONSULT NOTE - Follow Up Consult  Pharmacy Consult for Warfarin Indication: atrial fibrillation/hx CVA  No Known Allergies  Patient Measurements: Height: 5\' 7"  (170.2 cm) Weight: 294 lb 15.6 oz (133.8 kg) IBW/kg (Calculated) : 61.6  Vital Signs: Temp: 97.4 F (36.3 C) (10/17 1239) Temp Source: Oral (10/17 1239) BP: 108/75 (10/17 1239) Pulse Rate: 51 (10/17 1239)  Labs: Recent Labs    01/23/19 0523 01/24/19 0524 01/24/19 1719 01/25/19 0442 01/25/19 1405  HGB 8.0* 8.1* 8.3* 8.0* 8.2*  HCT 25.5*  --   --   --  26.5*  PLT 208  --   --   --   --   LABPROT  --   --  14.9  --   --   INR  --   --  1.2  --   --   CREATININE 1.75* 1.83*  --  1.83*  --     Estimated Creatinine Clearance: 40.9 mL/min (A) (by C-G formula based on SCr of 1.83 mg/dL (H)).   Medications:  Medications Prior to Admission  Medication Sig Dispense Refill Last Dose  . amiodarone (PACERONE) 200 MG tablet Take 200 mg by mouth 2 (two) times daily.   Unknown at Unknown  . atorvastatin (LIPITOR) 40 MG tablet Take 1 tablet by mouth once daily (Patient taking differently: Take 40 mg by mouth daily. ) 90 tablet 0 Unknown at Unknown  . carvedilol (COREG) 6.25 MG tablet TAKE 1 TABLET BY MOUTH TWICE DAILY WITH A MEAL 180 tablet 0 Unknown at Unknown  . glipiZIDE (GLUCOTROL) 10 MG tablet Take 10 mg by mouth 2 (two) times daily. Dr Honor Junes   Unknown at Unknown  . Insulin Degludec 200 UNIT/ML SOPN Inject 25 Units into the skin daily at 12 noon. Dr Honor Junes (1300)   Unknown at Unknown  . levothyroxine (SYNTHROID) 50 MCG tablet Take 50 mcg by mouth daily before breakfast.   Unknown at Unknown  . losartan (COZAAR) 25 MG tablet Take 1 tablet (25 mg total) by mouth daily. 30 tablet 11 Unknown at Unknown  . torsemide (DEMADEX) 20 MG tablet Take 2 tablets (40 mg total) by mouth 2 (two) times daily. 120 tablet 4 Unknown at Unknown  . triamcinolone cream (KENALOG) 0.5 % Apply 1 application topically 2 (two)  times daily as needed (psoriasis).    prn at prn  . warfarin (COUMADIN) 5 MG tablet USE AS DIRECTED BY COUMADIN CLINIC FOR 30 DAYS (Patient not taking: Take one 5 mg tablet Sunday, Tuesday, Wednesday, Thursday, and Saturday. And one and one-half tablets 7.5 mg on Monday and Friday) 45 tablet 3 Not Taking at Unknown time    Assessment: 68 year old female with no current active bleeding and normal other bowel movements with blood since admission and hemoglobin is stable.  Patients EGD canceled today as patient was hypoxemic with minimal sedation.  Pharmacy has been consulted to re-initiate and manage warfarin therapy post-bleed with the caveat to "start at low dose."     Goal of Therapy:  INR 2-3 Monitor platelets by anticoagulation protocol: Yes   Plan:  Will re-initiate warfarin with a 4mg  dose this evening (home dose 5mg  most days with 7.5mg  on M/F).   Lu Duffel, PharmD, BCPS Clinical Pharmacist 01/25/2019 2:27 PM

## 2019-01-25 NOTE — Progress Notes (Addendum)
Progress Note  Patient Name: Meghan [name] Date of Encounter: 01/25/2019  Primary Cardiologist: Kathlyn Sacramento, MD   Subjective   EGD this AM had to be aborted due to low O2 sats. Daughter at bedside, frustrated. This is my first day meeting them, but they report yesterday fluid levels were good, swelling in feet was gone. Daughter worried about what to do if fluid is going to continue to come back. We discussed heart failure management at length today.   Inpatient Medications    Scheduled Meds: . atorvastatin  40 mg Oral Daily  . Chlorhexidine Gluconate Cloth  6 each Topical Q0600  . hydrocortisone sod succinate (SOLU-CORTEF) inj  50 mg Intravenous Daily  . insulin aspart  0-15 Units Subcutaneous Q4H  . insulin aspart protamine- aspart  8 Units Subcutaneous TID WC  . insulin glargine  12 Units Subcutaneous QHS  . levothyroxine  50 mcg Oral QAC breakfast  . pantoprazole  40 mg Intravenous Q12H  . potassium chloride  20 mEq Oral BID  . potassium chloride  20 mEq Oral Once  . sodium chloride flush  3 mL Intravenous Q12H  . [START ON 01/26/2019] torsemide  40 mg Oral BID   Continuous Infusions: . sodium chloride 0 mL (01/17/19 1010)  . albumin human 12.5 g (01/24/19 1049)   PRN Meds: sodium chloride, acetaminophen, HYDROcodone-acetaminophen, ondansetron (ZOFRAN) IV, polyethylene glycol, sodium chloride flush   Vital Signs    Vitals:   01/25/19 0907 01/25/19 0917 01/25/19 0932 01/25/19 0947  BP: 124/82 106/73 104/75 91/63  Pulse:  89 97 60  Resp: (!) 22 16 14 18   Temp: 97.7 F (36.5 C)  97.7 F (36.5 C) 97.6 F (36.4 C)  TempSrc: Skin   Oral  SpO2: 98% 96% 96% 96%  Weight:      Height:        Intake/Output Summary (Last 24 hours) at 01/25/2019 1203 Last data filed at 01/25/2019 0921 Gross per 24 hour  Intake 641.12 ml  Output 1350 ml  Net -708.88 ml   Last 3 Weights 01/25/2019 01/24/2019 01/23/2019  Weight (lbs) 294 lb 15.6 oz 294 lb 1.5 oz 292 lb 1.8 oz   Weight (kg) 133.8 kg 133.4 kg 132.5 kg      Telemetry    Rate controlled afib. Had a dip to afib in the 40s during EGD per telemetry - Personally Reviewed  ECG    No new since 10/8 - Personally Reviewed  Physical Exam   GEN: No acute distress.  Lying in bed, [place] in place Neck: JVD difficult to assess due to body habitus Cardiac: irregularly irregular S1 and S2 with 2/6 SM, no rubs or gallops.  Respiratory: difficult to fully assess. Distant lung sounds, no appreciable crackles/wheezes GI: Soft, nontender, non-distended  MS: 2+ bilateral pitting edema to mid calf bilaterally Neuro:  Nonfocal  Psych: Normal affect   Labs    High Sensitivity Troponin:   Recent Labs  Lab 01/17/19 0011  TROPONINIHS 13      Chemistry Recent Labs  Lab 01/23/19 0523 01/24/19 0524 01/25/19 0442  NA 133* 135 136  K 3.3* 3.1* 3.3*  CL 91* 92* 93*  CO2 31 33* 33*  GLUCOSE 132* 158* 95  BUN 47* 55* 57*  CREATININE 1.75* 1.83* 1.83*  CALCIUM 8.4* 8.2* 8.3*  GFRNONAA 29* 27* 27*  GFRAA 34* 32* 32*  ANIONGAP 11 10 10      Hematology Recent Labs  Lab 01/21/19 0457 01/22/19 0546 01/23/19 0523 01/24/19  [id number] 01/24/19 1719 01/25/19 0442  WBC 5.2 5.9 10.1  --   --   --   RBC 2.89* 2.80* 3.04*  --   --   --   HGB 7.6* 7.4* 8.0* 8.1* 8.3* 8.0*  HCT 24.4* 23.6* 25.5*  --   --   --   MCV 84.4 84.3 83.9  --   --   --   MCH 26.3 26.4 26.3  --   --   --   MCHC 31.1 31.4 31.4  --   --   --   RDW 19.0* 19.3* 19.6*  --   --   --   PLT 206 188 208  --   --   --     BNPNo results for input(s): BNP, PROBNP in the last 168 hours.   DDimer No results for input(s): DDIMER in the last 168 hours.   Radiology    No results found.  Cardiac Studies   2D echo 12/2018: 1. The left ventricle has severely reduced systolic function, with an ejection fraction of 25-30%. The cavity size was mildly dilated. Left ventricular diastolic Doppler parameters are indeterminate. Left ventricular diffuse  hypokinesis. 2. The right ventricle has mildly reduced systolic function. The cavity was moderately enlarged. There is no increase in right ventricular wall thickness. Right ventricular systolic pressure is moderately elevated with an estimated pressure of 56.2  mmHg. 3. Left atrial size was severely dilated. 4. Right atrial size was moderately dilated. 5. Tricuspid valve regurgitation is moderate. 6. Rhythm is atrial fibrillation  Patient Profile     67 y.o. female with PMH CAD, HFrEF secondary to ICM, prior hyperkalemia with spironolactone in the setting of renal failure, persistent atrial fibrillation s/p recent DCCV 01/03/2019 that was briefly successful on amiodarone and Coumadin, CVA, anemia, carotid artery disease s/p left-sided CEA, DM, hypertension, hyperlipidemia, and is being followed for management of HFrEF  Assessment & Plan    Acute on chronic HFrEF secondary to ICM with likely cardiorenal syndrome -difficult situation. Lungs are distant and difficult to assess today, but with hypoxia on EGD and LE edema, I suspect there is still an active component of heart failure -however, Cr continues to rise with diurese. I spoke about this at length with daughter. She is asking about dialysis being better to control fluid. I discussed with her that we are not there yet, and dialysis comes with significant long term challenges -she is warm on exam, but if she clamps down/can't diurese, may need to consider inotropes. This will be a challenge with her afib. -I will restart IV lasix today. We need to attempt oral, potentially tomorrow, to make sure fluid status can be well controlled at home. -admission wt 139 kg, today 133.8 kg. Noted net negative 15L -12/2018 Echo above with EF 25-30%.  -If she tolerates diuresis and remains warm tomorrow, would restart a low dose beta blocker. If however doesn't diurese or Cr rises, may need to consider inotrope -Renal dysfunction precludes  ACE/ARB/[place]/Spironolactone.  Persistent atrial flutter/atrial fibrillation with RVR -again, complex situation. Did not maintain sinus rhythm after DCCV 9/25 despite amiodarone -sinus rhythm would ideally make HF management more successful, but cannot rhythm control without anticoagulation -unable to have EGD today. Discussion [id number] of coumadin. If this is fine by medicine/GI, and she remains stable, we may have more options for management -would also consider DOAC over coumadin to avoid supratherapeutic INRs (lower risk of GI bleed) with concomitant use of amiodarone. However, will defer this discussion  in the short term  CAD -Known severe CAD by catheterization 2018.  -Continue medical management as comorbid conditions allow.  GIB/blood loss anemia -per medicine/GI team  AOCKDIII -challenging given volume overload, see above  Lower extremity edema -Likely multifactorial given anemia, hypoalbuminemia -has venous stasis changes, will trial compression stockings  For questions or updates, please contact [place] Please consult [email or web address] for contact info under     Signed, Buford Dresser, MD  01/25/2019, 12:03 PM

## 2019-01-25 NOTE — Progress Notes (Addendum)
Patient ID: Meghan [name], female   DOB: [birth year], 68 y.o.   MRN: [id number]  [employer] [facility] PROGRESS NOTE  Meghan [name] [id number] DOB: 1950/04/28 DOA: 01/17/2019 PCP: [name], MD  HPI/Subjective: Patients EGD canceled today as patient was hypoxemic with minimal sedation.  During my examination she is resting comfortably on 2 L and daughter at bedside.  No active bleeding and normal other bowel movements with blood since admission and hemoglobin is stable.  Patient is reporting generalized weakness.  RN noticed head lice Objective: Vitals:   01/25/19 0947 01/25/19 1239  BP: 91/63 108/75  Pulse: 60 (!) 51  Resp: 18   Temp: 97.6 F (36.4 C) (!) 97.4 F (36.3 C)  SpO2: 96% 96%    Intake/Output Summary (Last 24 hours) at 01/25/2019 1331 Last data filed at 01/25/2019 0921 Gross per 24 hour  Intake 401.12 ml  Output 1350 ml  Net -948.88 ml   Filed Weights   01/23/19 0500 01/24/19 0239 01/25/19 0525  Weight: 132.5 kg 133.4 kg 133.8 kg    ROS: Review of Systems  Constitutional: Negative for chills and fever.  Eyes: Negative for blurred vision.  Respiratory: Negative for cough and shortness of breath.   Cardiovascular: Negative for chest pain.  Gastrointestinal: Positive for melena. Negative for abdominal pain, constipation, diarrhea, nausea and vomiting.  Genitourinary: Negative for dysuria.  Musculoskeletal: Negative for joint pain.  Neurological: Negative for dizziness and headaches.   Exam: Physical Exam  Constitutional: She is oriented to person, place, and time.  HENT:  Nose: No mucosal edema.  Mouth/Throat: No oropharyngeal exudate or posterior oropharyngeal edema.  Eyes: Pupils are equal, round, and reactive to light. Conjunctivae, EOM and lids are normal.  Neck: No JVD present. Carotid bruit is not present. No edema present. No thyroid mass and no thyromegaly present.  Cardiovascular: S1 normal and S2 normal. Exam reveals no gallop.  No murmur  heard. Pulses:      Dorsalis pedis pulses are 2+ on the right side and 2+ on the left side.  Respiratory: No respiratory distress. She has decreased breath sounds in the right lower field and the left lower field. She has no wheezes. She has no rhonchi. She has rales in the right lower field and the left lower field.  GI: Soft. Bowel sounds are normal. There is no abdominal tenderness.  Musculoskeletal:     Right ankle: She exhibits swelling.     Left ankle: She exhibits swelling.  Lymphadenopathy:    She has no cervical adenopathy.  Neurological: She is alert and oriented to person, place, and time. No cranial nerve deficit.  Skin: Skin is warm. Nails show no clubbing.  Chronic lower extremity discoloration  Psychiatric: She has a normal mood and affect.      Data Reviewed: Basic Metabolic Panel: Recent Labs  Lab 01/21/19 0457 01/22/19 0546 01/23/19 0523 01/24/19 0524 01/25/19 0442  NA 134* 133* 133* 135 136  K 3.5 3.4* 3.3* 3.1* 3.3*  CL 97* 94* 91* 92* 93*  CO2 28 29 31  33* 33*  GLUCOSE 166* 342* 132* 158* 95  BUN 53* 52* 47* 55* 57*  CREATININE 2.48* 2.15* 1.75* 1.83* 1.83*  CALCIUM 7.8* 7.8* 8.4* 8.2* 8.3*  MG 2.2  --  2.0 2.1  --   PHOS 3.3  --   --   --   --    CBC: Recent Labs  Lab 01/19/19 2053 01/20/19 0510 01/20/19 1313 01/21/19 0457 01/22/19 0546 01/23/19 0523 01/24/19 0524 01/24/19  1719 01/25/19 0442  WBC 5.5 5.4  --  5.2 5.9 10.1  --   --   --   NEUTROABS  --   --   --  3.3  --  8.7*  --   --   --   HGB 7.1* 6.9* 7.5* 7.6* 7.4* 8.0* 8.1* 8.3* 8.0*  HCT 23.4* 22.8* 23.8* 24.4* 23.6* 25.5*  --   --   --   MCV 84.8 84.4  --  84.4 84.3 83.9  --   --   --   PLT 176 195  --  206 188 208  --   --   --    BNP (last 3 results) Recent Labs    12/13/18 1136 01/17/19 0011  BNP 1,269.0* 790.0*     CBG: Recent Labs  Lab 01/25/19 0219 01/25/19 0229 01/25/19 0509 01/25/19 0730 01/25/19 1156  GLUCAP 101* 107* 87 96 95    Recent Results (from  the past 240 hour(s))  SARS Coronavirus 2 by RT PCR (hospital order, performed in [place] hospital lab) Nasopharyngeal Nasopharyngeal Swab     Status: None   Collection Time: 01/17/19  1:41 AM   Specimen: Nasopharyngeal Swab  Result Value Ref Range Status   SARS Coronavirus 2 NEGATIVE NEGATIVE Final    Comment: (NOTE) If result is NEGATIVE SARS-CoV-2 target nucleic acids are NOT DETECTED. The SARS-CoV-2 RNA is generally detectable in upper and lower  respiratory specimens during the acute phase of infection. The lowest  concentration of SARS-CoV-2 viral copies this assay can detect is 250  copies / mL. A negative result does not preclude SARS-CoV-2 infection  and should not be used as the sole basis for treatment or other  patient management decisions.  A negative result may occur with  improper specimen collection / handling, submission of specimen other  than nasopharyngeal swab, presence of viral mutation(s) within the  areas targeted by this assay, and inadequate number of viral copies  (<250 copies / mL). A negative result must be combined with clinical  observations, patient history, and epidemiological information. If result is POSITIVE SARS-CoV-2 target nucleic acids are DETECTED. The SARS-CoV-2 RNA is generally detectable in upper and lower  respiratory specimens dur ing the acute phase of infection.  Positive  results are indicative of active infection with SARS-CoV-2.  Clinical  correlation with patient history and other diagnostic information is  necessary to determine patient infection status.  Positive results do  not rule out bacterial infection or co-infection with other viruses. If result is PRESUMPTIVE POSTIVE SARS-CoV-2 nucleic acids MAY BE PRESENT.   A presumptive positive result was obtained on the submitted specimen  and confirmed on repeat testing.  While 2019 novel coronavirus  (SARS-CoV-2) nucleic acids may be present in the submitted sample  additional  confirmatory testing may be necessary for epidemiological  and / or clinical management purposes  to differentiate between  SARS-CoV-2 and other Sarbecovirus currently known to infect humans.  If clinically indicated additional testing with an alternate test  methodology [phone]) is advised. The SARS-CoV-2 RNA is generally  detectable in upper and lower respiratory sp ecimens during the acute  phase of infection. The expected result is Negative. Fact Sheet for Patients:  StrictlyIdeas.no Fact Sheet for Healthcare Providers: BankingDealers.co.za This test is not yet approved or cleared by the [place] FDA and has been authorized for detection and/or diagnosis of SARS-CoV-2 by FDA under an Emergency Use Authorization (EUA).  This EUA will remain in effect (  meaning this test can be used) for the duration of the COVID-19 declaration under Section 564(b)(1) of the Act, 21 U.S.C. section 360bbb-3(b)(1), unless the authorization is terminated or revoked sooner. Performed at [facility], [address] [place], [place] [address]   MRSA PCR Screening     Status: None   Collection Time: 01/17/19  4:44 AM   Specimen: Nasal Mucosa; Nasopharyngeal  Result Value Ref Range Status   MRSA by PCR NEGATIVE NEGATIVE Final    Comment:        The GeneXpert MRSA Assay (FDA approved for NASAL specimens only), is one component of a comprehensive MRSA colonization surveillance program. It is not intended to diagnose MRSA infection nor to guide or monitor treatment for MRSA infections. Performed at [facility], [address] [place], [place] [address]   Urine Culture     Status: Abnormal   Collection Time: 01/17/19 11:52 AM   Specimen: Urine, Clean Catch  Result Value Ref Range Status   Specimen Description   Final    URINE, CLEAN CATCH Performed at [facility], [address] [place], [place] [address]     Special Requests   Final    Normal Performed at [facility], [place], [place] [address]    Culture >=100,000 COLONIES/mL ESCHERICHIA COLI (A)  Final   Report Status 01/19/2019 FINAL  Final   Organism ID, Bacteria ESCHERICHIA COLI (A)  Final      Susceptibility   Escherichia coli - MIC*    AMPICILLIN >=32 RESISTANT Resistant     CEFAZOLIN <=4 SENSITIVE Sensitive     CEFTRIAXONE <=1 SENSITIVE Sensitive     CIPROFLOXACIN >=4 RESISTANT Resistant     GENTAMICIN <=1 SENSITIVE Sensitive     IMIPENEM <=0.25 SENSITIVE Sensitive     NITROFURANTOIN <=16 SENSITIVE Sensitive     TRIMETH/SULFA <=20 SENSITIVE Sensitive     AMPICILLIN/SULBACTAM 8 SENSITIVE Sensitive     PIP/TAZO <=4 SENSITIVE Sensitive     Extended ESBL NEGATIVE Sensitive     * >=100,000 COLONIES/mL ESCHERICHIA COLI      Scheduled Meds: . atorvastatin  40 mg Oral Daily  . Chlorhexidine Gluconate Cloth  6 each Topical Q0600  . furosemide      . furosemide  60 mg Intravenous BID  . hydrocortisone sod succinate (SOLU-CORTEF) inj  50 mg Intravenous Daily  . insulin aspart  0-15 Units Subcutaneous Q4H  . insulin aspart protamine- aspart  8 Units Subcutaneous TID WC  . insulin glargine  12 Units Subcutaneous QHS  . levothyroxine  50 mcg Oral QAC breakfast  . pantoprazole  40 mg Intravenous Q12H  . potassium chloride  20 mEq Oral BID  . sodium chloride flush  3 mL Intravenous Q12H   Continuous Infusions: . sodium chloride 0 mL (01/17/19 1010)  . albumin human 12.5 g (01/24/19 1049)    Assessment/Plan:  1. Acute on chronic systolic congestive heart failure.  Patient taken off milrinone and Lasix drip. Lasix dose increased to 60 mg IV from 40 mg holding losartan.  BiPAP nightly.  We will get repeat chest x-ray 2. Acute hypoxic respiratory failure.  Patient on 2 L of oxygen and normally she does not wear oxygen.  Wean down as tolerated 3. Cardiogenic shock.  Decrease Solu-Cortef to 50 mg daily dose.   Holding other antihypertensive medications. 4. GI bleed and melena.  Vitamin K given to reverse Coumadin. Reconsulted GI, seen by Dr. Bonna Gains, scheduled for EGD on  01/25/2019 but the procedure was canceled as patient became hypoxemic significantly with minimal sedation.  No active bleeding today hemoglobin 8.1 -8.0 , GI agreeable to resume Coumadin challenge if cardiology is agreeable patient on IV Protonix twice daily.  We will get tagged RBC scan if patient starts bleeding actively 5. Acute blood loss anemia.  Status post multiple transfusions.  GI is recommending bleeding scan if patient starts bleeding again 6. Acute kidney injury on chronic kidney disease 3 secondary to cardiorenal syndrome.  Creatinine improving with diuresis.  Nephrology is following 7. Persistent atrial fibrillation status post cardioversion and atrial fibrillation.  Amiodarone.  Holding anticoagulation.  Will check with cardiology to resume Coumadin 8. Type 2 diabetes mellitus on sliding scale insulin and long-acting insulin. 9. History of CVA -holding Coumadin 10. Weakness physical therapy evaluation appreciated.  PT recommending home with home health. 11. Urinary tract infection treated with 5 days of Rocephin.  DC Foley catheter and voiding trial 12.  Head lice-permethrin treatments 13.  Generalized weakness physical therapy consult  Code Status:     Code Status Orders  (From admission, onward)         Start     Ordered   01/17/19 0302  Full code  Continuous     01/17/19 0306        Code Status History    Date Active Date Inactive Code Status Order ID Comments User Context   12/13/2018 1415 12/15/2018 1952 Full Code [id number]  Otila Back, MD ED   03/30/2017 2002 04/01/2017 1940 DNR [id number]  Demetrios Loll, MD Inpatient   03/12/2017 1236 03/12/2017 1720 Full Code [id number]  Wellington Hampshire, MD Inpatient   09/21/2016 2056 09/24/2016 1922 Full Code [id number]  Theodoro Grist, MD Inpatient   07/26/2016 1530 07/28/2016  1812 Full Code [id number]  Theodoro Grist, MD Inpatient   Advance Care Planning Activity     Family Communication: Spoke with daughter at the bedside Disposition Plan: To be determined  Consultants:  Cardiology  Nephrology  Gastroenterology  Time spent: 33 minutes, case discussed with gastroenterology  Nicholes Mango  [employer]

## 2019-01-25 NOTE — Transfer of Care (Signed)
Immediate Anesthesia Transfer of Care Note  Patient: Meghan [name]  Procedure(s) Performed: ESOPHAGOGASTRODUODENOSCOPY (EGD) WITH PROPOFOL (N/A )  Patient Location: PACU  Anesthesia Type:General  Level of Consciousness: awake, alert  and oriented  Airway & Oxygen Therapy: Patient Spontanous Breathing and Patient connected to face mask oxygen  Post-op Assessment: Report given to RN and Post -op Vital signs reviewed and stable  Post vital signs: Reviewed and stable  Last Vitals:  Vitals Value Taken Time  BP 108/80 01/25/19 0902  Temp    Pulse 116 01/25/19 0903  Resp 16 01/25/19 0903  SpO2 94 % 01/25/19 0903  Vitals shown include unvalidated device data.  Last Pain:  Vitals:   01/25/19 0525  TempSrc: Axillary  PainSc:       Patients Stated Pain Goal: 0 ([id number])  Complications: No apparent anesthesia complications

## 2019-01-25 NOTE — Anesthesia Postprocedure Evaluation (Signed)
Anesthesia Post Note  Patient: Aitiana Cleveringa  Procedure(s) Performed: ESOPHAGOGASTRODUODENOSCOPY (EGD) WITH PROPOFOL (N/A )  Patient location during evaluation: PACU Anesthesia Type: General Level of consciousness: awake and alert and oriented Pain management: pain level controlled Vital Signs Assessment: post-procedure vital signs reviewed and stable Respiratory status: spontaneous breathing, nonlabored ventilation, respiratory function stable and patient connected to nasal cannula oxygen Cardiovascular status: blood pressure returned to baseline and stable Postop Assessment: no apparent nausea or vomiting Anesthetic complications: no     Last Vitals:  Vitals:   01/25/19 0932 01/25/19 0947  BP: 104/75 91/63  Pulse: 97 60  Resp: 14 18  Temp: 36.5 C 36.4 C  SpO2: 96% 96%    Last Pain:  Vitals:   01/25/19 0947  TempSrc: Oral  PainSc:                  Martha Clan

## 2019-01-26 DIAGNOSIS — D62 Acute posthemorrhagic anemia: Secondary | ICD-10-CM | POA: Diagnosis not present

## 2019-01-26 DIAGNOSIS — Z7901 Long term (current) use of anticoagulants: Secondary | ICD-10-CM

## 2019-01-26 DIAGNOSIS — I4891 Unspecified atrial fibrillation: Secondary | ICD-10-CM | POA: Diagnosis not present

## 2019-01-26 DIAGNOSIS — K921 Melena: Secondary | ICD-10-CM | POA: Diagnosis not present

## 2019-01-26 DIAGNOSIS — I5023 Acute on chronic systolic (congestive) heart failure: Secondary | ICD-10-CM | POA: Diagnosis not present

## 2019-01-26 LAB — CBC
HCT: 26.7 % — ABNORMAL LOW (ref 36.0–46.0)
Hemoglobin: 8.1 g/dL — ABNORMAL LOW (ref 12.0–15.0)
MCH: 26.1 pg (ref 26.0–34.0)
MCHC: 30.3 g/dL (ref 30.0–36.0)
MCV: 86.1 fL (ref 80.0–100.0)
Platelets: 196 10*3/uL (ref 150–400)
RBC: 3.1 MIL/uL — ABNORMAL LOW (ref 3.87–5.11)
RDW: 19.9 % — ABNORMAL HIGH (ref 11.5–15.5)
WBC: 6.7 10*3/uL (ref 4.0–10.5)
nRBC: 0.4 % — ABNORMAL HIGH (ref 0.0–0.2)

## 2019-01-26 LAB — BASIC METABOLIC PANEL
Anion gap: 9 (ref 5–15)
BUN: 56 mg/dL — ABNORMAL HIGH (ref 8–23)
CO2: 31 mmol/L (ref 22–32)
Calcium: 8.2 mg/dL — ABNORMAL LOW (ref 8.9–10.3)
Chloride: 94 mmol/L — ABNORMAL LOW (ref 98–111)
Creatinine, Ser: 1.93 mg/dL — ABNORMAL HIGH (ref 0.44–1.00)
GFR calc Af Amer: 30 mL/min — ABNORMAL LOW (ref >60)
GFR calc non Af Amer: 26 mL/min — ABNORMAL LOW (ref >60)
Glucose, Bld: 127 mg/dL — ABNORMAL HIGH (ref 70–99)
Potassium: 3.6 mmol/L (ref 3.5–5.1)
Sodium: 134 mmol/L — ABNORMAL LOW (ref 135–145)

## 2019-01-26 LAB — GLUCOSE, CAPILLARY
Glucose-Capillary: 116 mg/dL — ABNORMAL HIGH (ref 70–99)
Glucose-Capillary: 182 mg/dL — ABNORMAL HIGH (ref 70–99)
Glucose-Capillary: 258 mg/dL — ABNORMAL HIGH (ref 70–99)
Glucose-Capillary: 255 mg/dL — ABNORMAL HIGH (ref 70–99)
Glucose-Capillary: 225 mg/dL — ABNORMAL HIGH (ref 70–99)

## 2019-01-26 LAB — PROTIME-INR
INR: 1.2 (ref 0.8–1.2)
Prothrombin Time: 15.2 seconds (ref 11.4–15.2)

## 2019-01-26 MED ORDER — METOPROLOL TARTRATE 25 MG PO TABS
12.5000 mg | ORAL_TABLET | Freq: Two times a day (BID) | ORAL | Status: DC
  Administered 2019-01-26 (×2): 12.5 mg via ORAL
  Filled 2019-01-26 (×3): qty 1

## 2019-01-26 MED ORDER — TORSEMIDE 20 MG PO TABS
40.0000 mg | ORAL_TABLET | Freq: Every day | ORAL | Status: DC
  Administered 2019-01-26 – 2019-01-27 (×2): 40 mg via ORAL
  Filled 2019-01-26 (×2): qty 2

## 2019-01-26 MED ORDER — WARFARIN SODIUM 4 MG PO TABS
4.0000 mg | ORAL_TABLET | Freq: Once | ORAL | Status: AC
  Administered 2019-01-26: 18:00:00 4 mg via ORAL
  Filled 2019-01-26: qty 1

## 2019-01-26 NOTE — Progress Notes (Signed)
Cardiac MD aware of intermittent tachycardic. Pt experienced tachycardic with coughing, changing position, and when in pain. New order received.

## 2019-01-26 NOTE — Progress Notes (Signed)
Patient ID: Meghan [name], female   DOB: March 29, 1951, 68 y.o.   MRN: [id number]  [facility] PROGRESS NOTE  Meghan [name] [id number] DOB: October 04, 1950 DOA: 01/17/2019 PCP: [name], MD  HPI/Subjective: Patient is resting comfortably with no complaints no overnight incidents  Patients EGD canceled on 01/25/19  as patient was hypoxemic with minimal sedation.  Objective: Vitals:   01/25/19 1945 01/26/19 0423  BP: 101/63 121/81  Pulse: (!) 117 71  Resp: 20 18  Temp: 97.6 F (36.4 C) (!) 97.5 F (36.4 C)  SpO2: 94% 99%    Intake/Output Summary (Last 24 hours) at 01/26/2019 1400 Last data filed at 01/26/2019 1056 Gross per 24 hour  Intake -  Output 2000 ml  Net -2000 ml   Filed Weights   01/24/19 0239 01/25/19 0525 01/26/19 0423  Weight: 133.4 kg 133.8 kg 132.7 kg    ROS: Review of Systems  Constitutional: Negative for chills and fever.  Eyes: Negative for blurred vision.  Respiratory: Negative for cough and shortness of breath.   Cardiovascular: Negative for chest pain.  Gastrointestinal: Positive for melena. Negative for abdominal pain, constipation, diarrhea, nausea and vomiting.  Genitourinary: Negative for dysuria.  Musculoskeletal: Negative for joint pain.  Neurological: Negative for dizziness and headaches.   Exam: Physical Exam  Constitutional: She is oriented to person, place, and time.  HENT:  Nose: No mucosal edema.  Mouth/Throat: No oropharyngeal exudate or posterior oropharyngeal edema.  Eyes: Pupils are equal, round, and reactive to light. Conjunctivae, EOM and lids are normal.  Neck: No JVD present. Carotid bruit is not present. No edema present. No thyroid mass and no thyromegaly present.  Cardiovascular: S1 normal and S2 normal. Exam reveals no gallop.  No murmur heard. Pulses:      Dorsalis pedis pulses are 2+ on the right side and 2+ on the left side.  Respiratory: No respiratory distress. She has decreased breath sounds in the right lower  field and the left lower field. She has no wheezes. She has no rhonchi. She has rales in the right lower field and the left lower field.  GI: Soft. Bowel sounds are normal. There is no abdominal tenderness.  Musculoskeletal:     Right ankle: She exhibits swelling.     Left ankle: She exhibits swelling.  Lymphadenopathy:    She has no cervical adenopathy.  Neurological: She is alert and oriented to person, place, and time. No cranial nerve deficit.  Skin: Skin is warm. Nails show no clubbing.  Chronic lower extremity discoloration  Psychiatric: She has a normal mood and affect.      Data Reviewed: Basic Metabolic Panel: Recent Labs  Lab 01/21/19 0457 01/22/19 0546 01/23/19 0523 01/24/19 0524 01/25/19 0442 01/26/19 0616  NA 134* 133* 133* 135 136 134*  K 3.5 3.4* 3.3* 3.1* 3.3* 3.6  CL 97* 94* 91* 92* 93* 94*  CO2 28 29 31  33* 33* 31  GLUCOSE 166* 342* 132* 158* 95 127*  BUN 53* 52* 47* 55* 57* 56*  CREATININE 2.48* 2.15* 1.75* 1.83* 1.83* 1.93*  CALCIUM 7.8* 7.8* 8.4* 8.2* 8.3* 8.2*  MG 2.2  --  2.0 2.1  --   --   PHOS 3.3  --   --   --   --   --    CBC: Recent Labs  Lab 01/20/19 0510  01/21/19 0457 01/22/19 0546 01/23/19 0523  01/24/19 1719 01/25/19 0442 01/25/19 1405 01/25/19 1937 01/26/19 0616  WBC 5.4  --  5.2 5.9 10.1  --   --   --   --   --  6.7  NEUTROABS  --   --  3.3  --  8.7*  --   --   --   --   --   --   HGB 6.9*   < > 7.6* 7.4* 8.0*   < > 8.3* 8.0* 8.2* 8.3* 8.1*  HCT 22.8*   < > 24.4* 23.6* 25.5*  --   --   --  26.5* 27.2* 26.7*  MCV 84.4  --  84.4 84.3 83.9  --   --   --   --   --  86.1  PLT 195  --  206 188 208  --   --   --   --   --  196   < > = values in this interval not displayed.   BNP (last 3 results) Recent Labs    12/13/18 1136 01/17/19 0011  BNP 1,269.0* 790.0*     CBG: Recent Labs  Lab 01/25/19 1156 01/25/19 1715 01/25/19 2125 01/26/19 0812 01/26/19 1155  GLUCAP 95 182* 184* 116* 182*    Recent Results (from the  past 240 hour(s))  SARS Coronavirus 2 by RT PCR (hospital order, performed in [facility] hospital lab) Nasopharyngeal Nasopharyngeal Swab     Status: None   Collection Time: 01/17/19  1:41 AM   Specimen: Nasopharyngeal Swab  Result Value Ref Range Status   SARS Coronavirus 2 NEGATIVE NEGATIVE Final    Comment: (NOTE) If result is NEGATIVE SARS-CoV-2 target nucleic acids are NOT DETECTED. The SARS-CoV-2 RNA is generally detectable in upper and lower  respiratory specimens during the acute phase of infection. The lowest  concentration of SARS-CoV-2 viral copies this assay can detect is 250  copies / mL. A negative result does not preclude SARS-CoV-2 infection  and should not be used as the sole basis for treatment or other  patient management decisions.  A negative result may occur with  improper specimen collection / handling, submission of specimen other  than nasopharyngeal swab, presence of viral mutation(s) within the  areas targeted by this assay, and inadequate number of viral copies  (<250 copies / mL). A negative result must be combined with clinical  observations, patient history, and epidemiological information. If result is POSITIVE SARS-CoV-2 target nucleic acids are DETECTED. The SARS-CoV-2 RNA is generally detectable in upper and lower  respiratory specimens dur ing the acute phase of infection.  Positive  results are indicative of active infection with SARS-CoV-2.  Clinical  correlation with patient history and other diagnostic information is  necessary to determine patient infection status.  Positive results do  not rule out bacterial infection or co-infection with other viruses. If result is PRESUMPTIVE POSTIVE SARS-CoV-2 nucleic acids MAY BE PRESENT.   A presumptive positive result was obtained on the submitted specimen  and confirmed on repeat testing.  While 2019 novel coronavirus  (SARS-CoV-2) nucleic acids may be present in the submitted sample  additional  confirmatory testing may be necessary for epidemiological  and / or clinical management purposes  to differentiate between  SARS-CoV-2 and other Sarbecovirus currently known to infect humans.  If clinically indicated additional testing with an alternate test  methodology [phone]) is advised. The SARS-CoV-2 RNA is generally  detectable in upper and lower respiratory sp ecimens during the acute  phase of infection. The expected result is Negative. Fact Sheet for Patients:  StrictlyIdeas.no Fact Sheet for Healthcare Providers: BankingDealers.co.za This test is not yet approved or cleared by the [place] FDA and has been authorized  for detection and/or diagnosis of SARS-CoV-2 by FDA under an Emergency Use Authorization (EUA).  This EUA will remain in effect (meaning this test can be used) for the duration of the COVID-19 declaration under Section 564(b)(1) of the Act, 21 U.S.C. section 360bbb-3(b)(1), unless the authorization is terminated or revoked sooner. Performed at [facility], [address] [place], [place] [address]   MRSA PCR Screening     Status: None   Collection Time: 01/17/19  4:44 AM   Specimen: Nasal Mucosa; Nasopharyngeal  Result Value Ref Range Status   MRSA by PCR NEGATIVE NEGATIVE Final    Comment:        The GeneXpert MRSA Assay (FDA approved for NASAL specimens only), is one component of a comprehensive MRSA colonization surveillance program. It is not intended to diagnose MRSA infection nor to guide or monitor treatment for MRSA infections. Performed at [facility], [address] [place], [place] [address]   Urine Culture     Status: Abnormal   Collection Time: 01/17/19 11:52 AM   Specimen: Urine, Clean Catch  Result Value Ref Range Status   Specimen Description   Final    URINE, CLEAN CATCH Performed at [facility], [address] [place], [place] [address]     Special Requests   Final    Normal Performed at [facility], [place], [place] [address]    Culture >=100,000 COLONIES/mL ESCHERICHIA COLI (A)  Final   Report Status 01/19/2019 FINAL  Final   Organism ID, Bacteria ESCHERICHIA COLI (A)  Final      Susceptibility   Escherichia coli - MIC*    AMPICILLIN >=32 RESISTANT Resistant     CEFAZOLIN <=4 SENSITIVE Sensitive     CEFTRIAXONE <=1 SENSITIVE Sensitive     CIPROFLOXACIN >=4 RESISTANT Resistant     GENTAMICIN <=1 SENSITIVE Sensitive     IMIPENEM <=0.25 SENSITIVE Sensitive     NITROFURANTOIN <=16 SENSITIVE Sensitive     TRIMETH/SULFA <=20 SENSITIVE Sensitive     AMPICILLIN/SULBACTAM 8 SENSITIVE Sensitive     PIP/TAZO <=4 SENSITIVE Sensitive     Extended ESBL NEGATIVE Sensitive     * >=100,000 COLONIES/mL ESCHERICHIA COLI      Scheduled Meds: . atorvastatin  40 mg Oral Daily  . Chlorhexidine Gluconate Cloth  6 each Topical Q0600  . hydrocortisone sod succinate (SOLU-CORTEF) inj  50 mg Intravenous Daily  . insulin aspart  0-5 Units Subcutaneous QHS  . insulin aspart  0-9 Units Subcutaneous TID WC  . insulin aspart protamine- aspart  8 Units Subcutaneous TID WC  . insulin glargine  12 Units Subcutaneous QHS  . levothyroxine  50 mcg Oral QAC breakfast  . metoprolol tartrate  12.5 mg Oral BID  . pantoprazole  40 mg Intravenous Q12H  . potassium chloride  20 mEq Oral BID  . sodium chloride flush  3 mL Intravenous Q12H  . torsemide  40 mg Oral Daily  . warfarin  4 mg Oral ONCE-1800  . Warfarin - Pharmacist Dosing Inpatient   Does not apply q1800   Continuous Infusions: . sodium chloride 0 mL (01/17/19 1010)  . albumin human 12.5 g (01/26/19 1051)    Assessment/Plan:  1. Acute on chronic systolic congestive heart failure.  Patient taken off milrinone and Lasix drip. Lasix dose increased to 60 mg IV from 40 mg holding losartan.  But -17 L and the last 24 hours 400 cc negative output BiPAP nightly.   Pulmonary venous  congestion noticed with no pulmonary edema on repeat chest x-ray on 01/25/2019  2. Acute hypoxic respiratory failure.  Patient on 2 L of oxygen and normally she does not wear oxygen.  Wean down as tolerated 3. Cardiogenic shock.  Decrease Solu-Cortef to 50 mg daily dose.  Holding other antihypertensive medications. 4. GI bleed and melena.  Vitamin K given to reverse Coumadin. Reconsulted GI, seen by Dr. Bonna Gains, scheduled for EGD on 01/25/2019 but the procedure was canceled as patient became hypoxemic significantly with minimal sedation.  No active bleeding today hemoglobin 8.1 -8.0 -8.1, Coumadin resumed has GI and cardiology are agreeable to resume that patient on IV Protonix twice daily.  we will get tagged RBC scan if patient starts bleeding actively 5. Acute blood loss anemia.  Status post multiple transfusions.  GI is recommending bleeding scan if patient starts bleeding again 6. Acute kidney injury on chronic kidney disease 3 secondary to cardiorenal syndrome.  Creatinine improving with diuresis.  Nephrology is following 7. Persistent atrial fibrillation status post cardioversion and atrial fibrillation.  Amiodarone.  Coumadin resumed  8. type 2 diabetes mellitus on sliding scale insulin and long-acting insulin. 9. History of CVA -holding Coumadin 10. Weakness physical therapy evaluation appreciated.  PT recommending home with home health. 11. Urinary tract infection treated with 5 days of Rocephin.  DC Foley catheter and voiding trial 12.  Head lice-permethrin treatment given on 01/25/2019, will repeat if needed in 72 hours 13.  Generalized weakness physical therapy consult  Code Status:     Code Status Orders  (From admission, onward)         Start     Ordered   01/17/19 0302  Full code  Continuous     01/17/19 0306        Code Status History    Date Active Date Inactive Code Status Order ID Comments User Context   12/13/2018 1415 12/15/2018 1952 Full Code  [id number]  Otila Back, MD ED   03/30/2017 2002 04/01/2017 1940 DNR [id number]  Demetrios Loll, MD Inpatient   03/12/2017 1236 03/12/2017 1720 Full Code [id number]  Wellington Hampshire, MD Inpatient   09/21/2016 2056 09/24/2016 1922 Full Code [id number]  Theodoro Grist, MD Inpatient   07/26/2016 1530 07/28/2016 1812 Full Code [id number]  Theodoro Grist, MD Inpatient   Advance Care Planning Activity     Family Communication: Spoke with daughter at the bedside Disposition Plan: To be determined  Consultants:  Cardiology  Nephrology  Gastroenterology  Time spent: 33 minutes, case discussed with gastroenterology  Nicholes Mango  [facility]

## 2019-01-26 NOTE — Consult Note (Signed)
ANTICOAGULATION CONSULT NOTE - Follow Up Consult  Pharmacy Consult for Warfarin Indication: atrial fibrillation/hx CVA  No Known Allergies  Patient Measurements: Height: 5\' 7"  (170.2 cm) Weight: 292 lb 8.8 oz (132.7 kg) IBW/kg (Calculated) : 61.6  Vital Signs: Temp: 97.5 F (36.4 C) (10/18 0423) Temp Source: Oral (10/18 0423) BP: 121/81 (10/18 0423) Pulse Rate: 71 (10/18 0423)  Labs: Recent Labs    01/24/19 0524 01/24/19 1719 01/25/19 0442 01/25/19 1405 01/25/19 1937 01/26/19 0616  HGB 8.1* 8.3* 8.0* 8.2* 8.3* 8.1*  HCT  --   --   --  26.5* 27.2* 26.7*  PLT  --   --   --   --   --  196  LABPROT  --  14.9  --   --   --  15.2  INR  --  1.2  --   --   --  1.2  CREATININE 1.83*  --  1.83*  --   --  1.93*    Estimated Creatinine Clearance: 38.5 mL/min (A) (by C-G formula based on SCr of 1.93 mg/dL (H)).   Medications:  Medications Prior to Admission  Medication Sig Dispense Refill Last Dose  . amiodarone (PACERONE) 200 MG tablet Take 200 mg by mouth 2 (two) times daily.   Unknown at Unknown  . atorvastatin (LIPITOR) 40 MG tablet Take 1 tablet by mouth once daily (Patient taking differently: Take 40 mg by mouth daily. ) 90 tablet 0 Unknown at Unknown  . carvedilol (COREG) 6.25 MG tablet TAKE 1 TABLET BY MOUTH TWICE DAILY WITH A MEAL 180 tablet 0 Unknown at Unknown  . glipiZIDE (GLUCOTROL) 10 MG tablet Take 10 mg by mouth 2 (two) times daily. Dr Honor Junes   Unknown at Unknown  . Insulin Degludec 200 UNIT/ML SOPN Inject 25 Units into the skin daily at 12 noon. Dr Honor Junes (1300)   Unknown at Unknown  . levothyroxine (SYNTHROID) 50 MCG tablet Take 50 mcg by mouth daily before breakfast.   Unknown at Unknown  . losartan (COZAAR) 25 MG tablet Take 1 tablet (25 mg total) by mouth daily. 30 tablet 11 Unknown at Unknown  . torsemide (DEMADEX) 20 MG tablet Take 2 tablets (40 mg total) by mouth 2 (two) times daily. 120 tablet 4 Unknown at Unknown  . triamcinolone cream (KENALOG)  0.5 % Apply 1 application topically 2 (two) times daily as needed (psoriasis).    prn at prn  . warfarin (COUMADIN) 5 MG tablet USE AS DIRECTED BY COUMADIN CLINIC FOR 30 DAYS (Patient not taking: Take one 5 mg tablet Sunday, Tuesday, Wednesday, Thursday, and Saturday. And one and one-half tablets 7.5 mg on Monday and Friday) 45 tablet 3 Not Taking at Unknown time    Assessment: 68 year old female with no current active bleeding and normal other bowel movements with blood since admission and hemoglobin is stable.  Patients EGD canceled today as patient was hypoxemic with minimal sedation.  Pharmacy has been consulted to re-initiate and manage warfarin therapy post-bleed with the caveat to "start at low dose." Home regimen 5 mg all days except 7.5 mg on Monday and Friday.     Date INR Dose 10/18  4 mg 10/19 1.2   Goal of Therapy:  INR 2-3 Monitor platelets by anticoagulation protocol: Yes   Plan:  Will give warfarin 4 mg x 1 this evening. INR with morning labs.   Tawnya Crook, PharmD Clinical Pharmacist 01/26/2019 7:54 AM

## 2019-01-26 NOTE — Progress Notes (Signed)
[facility], [place] 01/26/19  Subjective:  Patient appears to be doing better. Creatinine down to 1.9. Transitioned now to torsemide.  Objective:  Vital signs in last 24 hours:  Temp:  [97.5 F (36.4 C)-97.7 F (36.5 C)] 97.7 F (36.5 C) (10/18 1154) Pulse Rate:  [71-118] 118 (10/18 1154) Resp:  [18-20] 20 (10/18 1154) BP: (101-121)/(63-82) 106/82 (10/18 1154) SpO2:  [94 %-100 %] 100 % (10/18 1154) Weight:  [132.7 kg] 132.7 kg (10/18 0423)  Weight change: -1.1 kg Filed Weights   01/24/19 0239 01/25/19 0525 01/26/19 0423  Weight: 133.4 kg 133.8 kg 132.7 kg    Intake/Output:    Intake/Output Summary (Last 24 hours) at 01/26/2019 1454 Last data filed at 01/26/2019 1056 Gross per 24 hour  Intake -  Output 2000 ml  Net -2000 ml    Physical Exam: General:  No acute distress, obese female, laying in the bed  HEENT  moist oral mucous membranes  Neck:  Supple  Lungs:  Clear bilateral, normal effort  Heart::  S1S2 no rubs  Abdomen:  Soft, nontender  Extremities:  1+ pitting edema  Neurologic:  Alert, able to answer questions  Skin:  Warm, dry  Foley in place    Basic Metabolic Panel:  Recent Labs  Lab 01/21/19 0457 01/22/19 0546 01/23/19 0523 01/24/19 0524 01/25/19 0442 01/26/19 0616  NA 134* 133* 133* 135 136 134*  K 3.5 3.4* 3.3* 3.1* 3.3* 3.6  CL 97* 94* 91* 92* 93* 94*  CO2 28 29 31  33* 33* 31  GLUCOSE 166* 342* 132* 158* 95 127*  BUN 53* 52* 47* 55* 57* 56*  CREATININE 2.48* 2.15* 1.75* 1.83* 1.83* 1.93*  CALCIUM 7.8* 7.8* 8.4* 8.2* 8.3* 8.2*  MG 2.2  --  2.0 2.1  --   --   PHOS 3.3  --   --   --   --   --      CBC: Recent Labs  Lab 01/20/19 0510  01/21/19 0457 01/22/19 0546 01/23/19 0523  01/24/19 1719 01/25/19 0442 01/25/19 1405 01/25/19 1937 01/26/19 0616  WBC 5.4  --  5.2 5.9 10.1  --   --   --   --   --  6.7  NEUTROABS  --   --  3.3  --  8.7*  --   --   --   --   --   --   HGB 6.9*   < > 7.6* 7.4*  8.0*   < > 8.3* 8.0* 8.2* 8.3* 8.1*  HCT 22.8*   < > 24.4* 23.6* 25.5*  --   --   --  26.5* 27.2* 26.7*  MCV 84.4  --  84.4 84.3 83.9  --   --   --   --   --  86.1  PLT 195  --  206 188 208  --   --   --   --   --  196   < > = values in this interval not displayed.     No results found for: HEPBSAG, HEPBSAB, HEPBIGM    Microbiology:  Recent Results (from the past 240 hour(s))  SARS Coronavirus 2 by RT PCR (hospital order, performed in [facility] hospital lab) Nasopharyngeal Nasopharyngeal Swab     Status: None   Collection Time: 01/17/19  1:41 AM   Specimen: Nasopharyngeal Swab  Result Value Ref Range Status   SARS Coronavirus 2 NEGATIVE NEGATIVE Final    Comment: (NOTE) If result is  NEGATIVE SARS-CoV-2 target nucleic acids are NOT DETECTED. The SARS-CoV-2 RNA is generally detectable in upper and lower  respiratory specimens during the acute phase of infection. The lowest  concentration of SARS-CoV-2 viral copies this assay can detect is 250  copies / mL. A negative result does not preclude SARS-CoV-2 infection  and should not be used as the sole basis for treatment or other  patient management decisions.  A negative result may occur with  improper specimen collection / handling, submission of specimen other  than nasopharyngeal swab, presence of viral mutation(s) within the  areas targeted by this assay, and inadequate number of viral copies  (<250 copies / mL). A negative result must be combined with clinical  observations, patient history, and epidemiological information. If result is POSITIVE SARS-CoV-2 target nucleic acids are DETECTED. The SARS-CoV-2 RNA is generally detectable in upper and lower  respiratory specimens dur ing the acute phase of infection.  Positive  results are indicative of active infection with SARS-CoV-2.  Clinical  correlation with patient history and other diagnostic information is  necessary to determine patient infection status.  Positive  results do  not rule out bacterial infection or co-infection with other viruses. If result is PRESUMPTIVE POSTIVE SARS-CoV-2 nucleic acids MAY BE PRESENT.   A presumptive positive result was obtained on the submitted specimen  and confirmed on repeat testing.  While 2019 novel coronavirus  (SARS-CoV-2) nucleic acids may be present in the submitted sample  additional confirmatory testing may be necessary for epidemiological  and / or clinical management purposes  to differentiate between  SARS-CoV-2 and other Sarbecovirus currently known to infect humans.  If clinically indicated additional testing with an alternate test  methodology [phone]) is advised. The SARS-CoV-2 RNA is generally  detectable in upper and lower respiratory sp ecimens during the acute  phase of infection. The expected result is Negative. Fact Sheet for Patients:  StrictlyIdeas.no Fact Sheet for Healthcare Providers: BankingDealers.co.za This test is not yet approved or cleared by the [place] FDA and has been authorized for detection and/or diagnosis of SARS-CoV-2 by FDA under an Emergency Use Authorization (EUA).  This EUA will remain in effect (meaning this test can be used) for the duration of the COVID-19 declaration under Section 564(b)(1) of the Act, 21 U.S.C. section 360bbb-3(b)(1), unless the authorization is terminated or revoked sooner. Performed at [facility], [address] [place], [place] [address]   MRSA PCR Screening     Status: None   Collection Time: 01/17/19  4:44 AM   Specimen: Nasal Mucosa; Nasopharyngeal  Result Value Ref Range Status   MRSA by PCR NEGATIVE NEGATIVE Final    Comment:        The GeneXpert MRSA Assay (FDA approved for NASAL specimens only), is one component of a comprehensive MRSA colonization surveillance program. It is not intended to diagnose MRSA infection nor to guide or monitor treatment for MRSA  infections. Performed at [facility], [address] [place], [place] [address]   Urine Culture     Status: Abnormal   Collection Time: 01/17/19 11:52 AM   Specimen: Urine, Clean Catch  Result Value Ref Range Status   Specimen Description   Final    URINE, CLEAN CATCH Performed at [facility], [address] [place], [place] [address]    Special Requests   Final    Normal Performed at [facility], [address] [place],  [address]    Culture >=100,000 COLONIES/mL ESCHERICHIA  COLI (A)  Final   Report Status 01/19/2019 FINAL  Final   Organism ID, Bacteria ESCHERICHIA COLI (A)  Final      Susceptibility   Escherichia coli - MIC*    AMPICILLIN >=32 RESISTANT Resistant     CEFAZOLIN <=4 SENSITIVE Sensitive     CEFTRIAXONE <=1 SENSITIVE Sensitive     CIPROFLOXACIN >=4 RESISTANT Resistant     GENTAMICIN <=1 SENSITIVE Sensitive     IMIPENEM <=0.25 SENSITIVE Sensitive     NITROFURANTOIN <=16 SENSITIVE Sensitive     TRIMETH/SULFA <=20 SENSITIVE Sensitive     AMPICILLIN/SULBACTAM 8 SENSITIVE Sensitive     PIP/TAZO <=4 SENSITIVE Sensitive     Extended ESBL NEGATIVE Sensitive     * >=100,000 COLONIES/mL ESCHERICHIA COLI    Coagulation Studies: Recent Labs    01/24/19 1719 01/26/19 0616  LABPROT 14.9 15.2  INR 1.2 1.2    Urinalysis: No results for input(s): COLORURINE, LABSPEC, PHURINE, GLUCOSEU, HGBUR, BILIRUBINUR, KETONESUR, PROTEINUR, UROBILINOGEN, NITRITE, LEUKOCYTESUR in the last 72 hours.  Invalid input(s): APPERANCEUR    Imaging: Dg Chest Port 1 View  Result Date: 01/25/2019 CLINICAL DATA:  68 year old female with history of shortness of breath and hypoxia. EXAM: PORTABLE CHEST 1 VIEW COMPARISON:  Chest x-ray 01/20/2019. FINDINGS: Lung volumes are normal. No consolidative airspace disease. No pleural effusions. No pneumothorax. No definite suspicious appearing pulmonary nodules or masses are noted. Cephalization of the  pulmonary vasculature. Moderate cardiomegaly. The patient is rotated to the left on today's exam, resulting in distortion of the mediastinal contours and reduced diagnostic sensitivity and specificity for mediastinal pathology. Aortic atherosclerosis. IMPRESSION: 1. Cardiomegaly with pulmonary venous congestion, but no frank pulmonary edema. Electronically Signed   By: Vinnie Langton M.D.   On: 01/25/2019 15:29     Medications:    Current Facility-Administered Medications (Endocrine & Metabolic):  .  hydrocortisone sodium succinate (SOLU-CORTEF) 100 MG injection 50 mg .  insulin aspart (novoLOG) injection 0-5 Units .  insulin aspart (novoLOG) injection 0-9 Units .  insulin aspart protamine- aspart (NOVOLOG MIX 70/30) injection 8 Units .  insulin glargine (LANTUS) injection 12 Units .  levothyroxine (SYNTHROID) tablet 50 mcg   Current Facility-Administered Medications (Cardiovascular):  .  atorvastatin (LIPITOR) tablet 40 mg .  metoprolol tartrate (LOPRESSOR) tablet 12.5 mg .  torsemide (DEMADEX) tablet 40 mg     Current Facility-Administered Medications (Analgesics):  .  acetaminophen (TYLENOL) tablet 650 mg .  HYDROcodone-acetaminophen (NORCO/VICODIN) 5-325 MG per tablet 1-2 tablet   Current Facility-Administered Medications (Hematological):  .  albumin human 25 % solution 12.5 g .  warfarin (COUMADIN) tablet 4 mg   Current Facility-Administered Medications (Other):  .  0.9 %  sodium chloride infusion .  Chlorhexidine Gluconate Cloth 2 % PADS 6 each .  ondansetron (ZOFRAN) injection 4 mg .  pantoprazole (PROTONIX) injection 40 mg .  polyethylene glycol (MIRALAX / GLYCOLAX) packet 17 g .  potassium chloride SA (KLOR-CON) CR tablet 20 mEq .  sodium chloride flush (NS) 0.9 % injection 3 mL .  sodium chloride flush (NS) 0.9 % injection 3 mL .  Warfarin - Pharmacist Dosing Inpatient  No current outpatient medications on file.    Assessment/ Plan:  68 y.o. female with   medical problems of chronic systolic congestive heart failure, coronary disease, atrial fibrillation requiring anticoagulation, diabetes, anemia, carotid artery disease with history of left carotid endarterectomy, previous stroke, hypertension, chronic kidney disease, was admitted on 01/17/2019 with shortness of breath worsening edema, acute kidney injury.  1.  Acute kidney injury 2.  Chronic kidney disease stage III (N18.30) 3.  Shortness of breath 4.  Generalized edema 5.  Severe anemia 6.  Diabetes, insulin-dependent.  Hemoglobin A1c 7.4% December 14, 2018 7.  Hematuria 8.  Proteinuria 9.  Severe chronic systolic congestive heart failure LVEF 25 to 30% 10.  Pulmonary hypertension 11.  Atrial fibrillation requiring anticoagulation 12.  Hyponatremia 13. UTI - E Coli 01/17/2019    Acute kidney injury is likely secondary to cardiorenal syndrome.  Patient had relative hypotension which may have  Led to renal hypoperfusion.  She has underlying chronic kidney disease secondary to diabetes and hypertension.  Patient is also noted to have severe chronic congestive heart failure and hypoalbuminemia.  Recommend: -Patient now taken off of furosemide and transitioned over to torsemide 40 mg daily which appears to be reasonable as her lower extremity edema has now improved.  Continue to monitor renal parameters closely as well as her overall volume status.  LOS: 9 Dariana Garbett 10/18/20202:54 PM  [place], [place]  Note: This note was prepared with Dragon dictation. Any transcription errors are unintentional

## 2019-01-26 NOTE — Progress Notes (Signed)
Meghan Antigua, MD [address], [place], [place], [place], [address] [address], [place], [place], [place], [address] Phone: [phone]  Fax: [phone]   Subjective: No further melena.  No abdominal pain.  No nausea or vomiting.   Objective: Exam: Vital signs in last 24 hours: Vitals:   01/25/19 0947 01/25/19 1239 01/25/19 1945 01/26/19 0423  BP: 91/63 108/75 101/63 121/81  Pulse: 60 (!) 51 (!) 117 71  Resp: 18  20 18   Temp: 97.6 F (36.4 C) (!) 97.4 F (36.3 C) 97.6 F (36.4 C) (!) 97.5 F (36.4 C)  TempSrc: Oral Oral Oral Oral  SpO2: 96% 96% 94% 99%  Weight:    132.7 kg  Height:       Weight change: -1.1 kg  Intake/Output Summary (Last 24 hours) at 01/26/2019 [id number] Last data filed at 01/26/2019 0516 Gross per 24 hour  Intake -  Output 1600 ml  Net -1600 ml    General: No acute distress, AAO x3 Abd: Soft, NT/ND, No HSM Skin: Warm, no rashes Neck: Supple, Trachea midline   Lab Results: Lab Results  Component Value Date   WBC 6.7 01/26/2019   HGB 8.1 (L) 01/26/2019   HCT 26.7 (L) 01/26/2019   MCV 86.1 01/26/2019   PLT 196 01/26/2019   Micro Results: Recent Results (from the past 240 hour(s))  SARS Coronavirus 2 by RT PCR (hospital order, performed in [facility] hospital lab) Nasopharyngeal Nasopharyngeal Swab     Status: None   Collection Time: 01/17/19  1:41 AM   Specimen: Nasopharyngeal Swab  Result Value Ref Range Status   SARS Coronavirus 2 NEGATIVE NEGATIVE Final    Comment: (NOTE) If result is NEGATIVE SARS-CoV-2 target nucleic acids are NOT DETECTED. The SARS-CoV-2 RNA is generally detectable in upper and lower  respiratory specimens during the acute phase of infection. The lowest  concentration of SARS-CoV-2 viral copies this assay can detect is 250  copies / mL. A negative result does not preclude SARS-CoV-2 infection  and should not be used as the sole basis for treatment or other  patient management decisions.  A  negative result may occur with  improper specimen collection / handling, submission of specimen other  than nasopharyngeal swab, presence of viral mutation(s) within the  areas targeted by this assay, and inadequate number of viral copies  (<250 copies / mL). A negative result must be combined with clinical  observations, patient history, and epidemiological information. If result is POSITIVE SARS-CoV-2 target nucleic acids are DETECTED. The SARS-CoV-2 RNA is generally detectable in upper and lower  respiratory specimens dur ing the acute phase of infection.  Positive  results are indicative of active infection with SARS-CoV-2.  Clinical  correlation with patient history and other diagnostic information is  necessary to determine patient infection status.  Positive results do  not rule out bacterial infection or co-infection with other viruses. If result is PRESUMPTIVE POSTIVE SARS-CoV-2 nucleic acids MAY BE PRESENT.   A presumptive positive result was obtained on the submitted specimen  and confirmed on repeat testing.  While 2019 novel coronavirus  (SARS-CoV-2) nucleic acids may be present in the submitted sample  additional confirmatory testing may be necessary for epidemiological  and / or clinical management purposes  to differentiate between  SARS-CoV-2 and other Sarbecovirus currently known to infect humans.  If clinically indicated additional testing with an alternate test  methodology [phone]) is advised. The SARS-CoV-2 RNA is generally  detectable in upper and lower respiratory  sp ecimens during the acute  phase of infection. The expected result is Negative. Fact Sheet for Patients:  StrictlyIdeas.no Fact Sheet for Healthcare Providers: BankingDealers.co.za This test is not yet approved or cleared by the [place] FDA and has been authorized for detection and/or diagnosis of SARS-CoV-2 by FDA under an Emergency Use  Authorization (EUA).  This EUA will remain in effect (meaning this test can be used) for the duration of the COVID-19 declaration under Section 564(b)(1) of the Act, 21 U.S.C. section 360bbb-3(b)(1), unless the authorization is terminated or revoked sooner. Performed at [facility], [address] [place], [place] [address]   MRSA PCR Screening     Status: None   Collection Time: 01/17/19  4:44 AM   Specimen: Nasal Mucosa; Nasopharyngeal  Result Value Ref Range Status   MRSA by PCR NEGATIVE NEGATIVE Final    Comment:        The GeneXpert MRSA Assay (FDA approved for NASAL specimens only), is one component of a comprehensive MRSA colonization surveillance program. It is not intended to diagnose MRSA infection nor to guide or monitor treatment for MRSA infections. Performed at [facility], [address] [place], [place] [address]   Urine Culture     Status: Abnormal   Collection Time: 01/17/19 11:52 AM   Specimen: Urine, Clean Catch  Result Value Ref Range Status   Specimen Description   Final    URINE, CLEAN CATCH Performed at [facility], [address] [place], [place] [address]    Special Requests   Final    Normal Performed at [facility], [place], [place] [address]    Culture >=100,000 COLONIES/mL ESCHERICHIA COLI (A)  Final   Report Status 01/19/2019 FINAL  Final   Organism ID, Bacteria ESCHERICHIA COLI (A)  Final      Susceptibility   Escherichia coli - MIC*    AMPICILLIN >=32 RESISTANT Resistant     CEFAZOLIN <=4 SENSITIVE Sensitive     CEFTRIAXONE <=1 SENSITIVE Sensitive     CIPROFLOXACIN >=4 RESISTANT Resistant     GENTAMICIN <=1 SENSITIVE Sensitive     IMIPENEM <=0.25 SENSITIVE Sensitive     NITROFURANTOIN <=16 SENSITIVE Sensitive     TRIMETH/SULFA <=20 SENSITIVE Sensitive     AMPICILLIN/SULBACTAM 8 SENSITIVE Sensitive     PIP/TAZO <=4 SENSITIVE Sensitive     Extended ESBL NEGATIVE Sensitive      * >=100,000 COLONIES/mL ESCHERICHIA COLI   Studies/Results: Dg Chest Port 1 View  Result Date: 01/25/2019 CLINICAL DATA:  68 year old female with history of shortness of breath and hypoxia. EXAM: PORTABLE CHEST 1 VIEW COMPARISON:  Chest x-ray 01/20/2019. FINDINGS: Lung volumes are normal. No consolidative airspace disease. No pleural effusions. No pneumothorax. No definite suspicious appearing pulmonary nodules or masses are noted. Cephalization of the pulmonary vasculature. Moderate cardiomegaly. The patient is rotated to the left on today's exam, resulting in distortion of the mediastinal contours and reduced diagnostic sensitivity and specificity for mediastinal pathology. Aortic atherosclerosis. IMPRESSION: 1. Cardiomegaly with pulmonary venous congestion, but no frank pulmonary edema. Electronically Signed   By: Vinnie Langton M.D.   On: 01/25/2019 15:29   Medications:  Scheduled Meds: . atorvastatin  40 mg Oral Daily  . Chlorhexidine Gluconate Cloth  6 each Topical Q0600  . furosemide  60 mg Intravenous BID  . hydrocortisone sod succinate (SOLU-CORTEF) inj  50 mg Intravenous Daily  . insulin aspart  0-5 Units Subcutaneous QHS  . insulin aspart  0-9 Units Subcutaneous TID WC  . insulin aspart protamine- aspart  8 Units Subcutaneous TID WC  . insulin glargine  12 Units Subcutaneous QHS  . levothyroxine  50 mcg Oral QAC breakfast  . pantoprazole  40 mg Intravenous Q12H  . potassium chloride  20 mEq Oral BID  . sodium chloride flush  3 mL Intravenous Q12H  . warfarin  4 mg Oral ONCE-1800  . Warfarin - Pharmacist Dosing Inpatient   Does not apply q1800   Continuous Infusions: . sodium chloride 0 mL (01/17/19 1010)  . albumin human 12.5 g (01/24/19 1049)   PRN Meds:.sodium chloride, acetaminophen, HYDROcodone-acetaminophen, ondansetron (ZOFRAN) IV, polyethylene glycol, sodium chloride flush   Assessment: Active Problems:   Acute on chronic systolic CHF (congestive heart  failure) ([place])   Procedure not carried out because of contraindication   Melena    Plan: Melena has resolved at this time Hemoglobin stable If Primary team restarts anticoagulation, would do so with very close monitoring as source of patient's anemia and symptoms is unknown.  If active bleeding reoccurs, would recommend RBC scan or CTA at the time of the bleed as endoscopic procedures remain high risk as evidenced by her hypoxia with minimal sedation yesterday leading to aborting the procedure  Endoscopic procedures can be done when patient is further medically optimized from oxygenation standpoint at a later time  Avoid NSAIDs Continue PPI twice daily  GI service will sign off at this time.  Please page with any signs of active GI bleeding or any concerns or questions   LOS: 9 days   Meghan Antigua, MD 01/26/2019, 9:33 AM

## 2019-01-26 NOTE — Consult Note (Signed)
PHARMACY CONSULT NOTE  Pharmacy Consult for Electrolyte Monitoring and Replacement   Recent Labs: Potassium (mmol/L)  Date Value  01/26/2019 3.6  07/10/2013 3.6   Magnesium (mg/dL)  Date Value  01/24/2019 2.1  07/10/2013 1.5 (L)   Calcium (mg/dL)  Date Value  01/26/2019 8.2 (L)   Calcium, Total (mg/dL)  Date Value  07/10/2013 8.1 (L)   Albumin (g/dL)  Date Value  01/17/2019 2.2 (L)  06/15/2017 3.5 (L)  07/08/2013 1.7 (L)   Phosphorus (mg/dL)  Date Value  01/21/2019 3.3   Sodium (mmol/L)  Date Value  01/26/2019 134 (L)  12/24/2018 135  07/10/2013 134 (L)   Corrected Ca: 9.24 mg/dL  Assessment: 68 y.o. female with medical problems of chronic systolic congestive heart failure, coronary disease, atrial fibrillation requiring anticoagulation, diabetes, anemia, carotid artery disease with history of left carotid endarterectomy, previous stroke, hypertension, chronic kidney disease, was admitted on10/9/2020with shortness of breath worsening edema, acute kidney injury. This patient was started on a Lasix drip 10/12  Goal of Therapy:  Electrolytes WNL  Plan:  K 3.6. Furosemide increased to 60 mg IV BID. Patient on potassium 20 mEq PO BID. F/u electrolytes with am labs.  Tawnya Crook ,PharmD Clinical Pharmacist 01/26/2019 7:50 AM

## 2019-01-26 NOTE — Progress Notes (Signed)
Progress Note  Patient Name: Aram Devincent Date of Encounter: 01/26/2019  Primary Cardiologist: Kathlyn Sacramento, MD   Subjective   Patient having hip pain this AM, uncomfortable on my interview. Feels breathing is somewhat improved. No chest pain. Compression stockings in place today. No bleeding noted.  Inpatient Medications    Scheduled Meds: . atorvastatin  40 mg Oral Daily  . Chlorhexidine Gluconate Cloth  6 each Topical Q0600  . furosemide  60 mg Intravenous BID  . hydrocortisone sod succinate (SOLU-CORTEF) inj  50 mg Intravenous Daily  . insulin aspart  0-5 Units Subcutaneous QHS  . insulin aspart  0-9 Units Subcutaneous TID WC  . insulin aspart protamine- aspart  8 Units Subcutaneous TID WC  . insulin glargine  12 Units Subcutaneous QHS  . levothyroxine  50 mcg Oral QAC breakfast  . pantoprazole  40 mg Intravenous Q12H  . potassium chloride  20 mEq Oral BID  . sodium chloride flush  3 mL Intravenous Q12H  . warfarin  4 mg Oral ONCE-1800  . Warfarin - Pharmacist Dosing Inpatient   Does not apply q1800   Continuous Infusions: . sodium chloride 0 mL (01/17/19 1010)  . albumin human 12.5 g (01/26/19 1051)   PRN Meds: sodium chloride, acetaminophen, HYDROcodone-acetaminophen, ondansetron (ZOFRAN) IV, polyethylene glycol, sodium chloride flush   Vital Signs    Vitals:   01/25/19 0947 01/25/19 1239 01/25/19 1945 01/26/19 0423  BP: 91/63 108/75 101/63 121/81  Pulse: 60 (!) 51 (!) 117 71  Resp: 18  20 18   Temp: 97.6 F (36.4 C) (!) 97.4 F (36.3 C) 97.6 F (36.4 C) (!) 97.5 F (36.4 C)  TempSrc: Oral Oral Oral Oral  SpO2: 96% 96% 94% 99%  Weight:    132.7 kg  Height:        Intake/Output Summary (Last 24 hours) at 01/26/2019 1201 Last data filed at 01/26/2019 1056 Gross per 24 hour  Intake -  Output 2000 ml  Net -2000 ml   Last 3 Weights 01/26/2019 01/25/2019 01/24/2019  Weight (lbs) 292 lb 8.8 oz 294 lb 15.6 oz 294 lb 1.5 oz  Weight (kg) 132.7 kg 133.8  kg 133.4 kg      Telemetry    Afib, largely rate controlled but in 110s currently while in pain - Personally Reviewed  ECG    No new since 10/8 - Personally Reviewed  Physical Exam   GEN: Well nourished, well developed, [place] in place.  HEENT: Normal, moist mucous membranes NECK: JVD difficult to assess 2/2 body habitus CARDIAC: irregularly iregular rhythm, normal S1 and S2, no rubs, gallops. 2/6 SM VASCULAR: Radial and DP pulses 2+ bilaterally.  RESPIRATORY:  Difficult to assess, in pain and cannot sit up/roll. Distant lung sounds anterior/laterally, no rales or wheezes appreciated. ABDOMEN: Soft, non-tender, non-distended MUSCULOSKELETAL:  Ambulates independently SKIN: Extremities warm. Light compression stockings in place. 1-2+ bilateral pitting LE edema NEUROLOGIC:  Alert and oriented x 3. No focal neuro deficits noted. PSYCHIATRIC:  Normal affect   Labs    High Sensitivity Troponin:   Recent Labs  Lab 01/17/19 0011  TROPONINIHS 13      Chemistry Recent Labs  Lab 01/24/19 0524 01/25/19 0442 01/26/19 0616  NA 135 136 134*  K 3.1* 3.3* 3.6  CL 92* 93* 94*  CO2 33* 33* 31  GLUCOSE 158* 95 127*  BUN 55* 57* 56*  CREATININE 1.83* 1.83* 1.93*  CALCIUM 8.2* 8.3* 8.2*  GFRNONAA 27* 27* 26*  GFRAA 32* 32* 30*  ANIONGAP 10 10 9      Hematology Recent Labs  Lab 01/22/19 0546 01/23/19 0523  01/25/19 1405 01/25/19 1937 01/26/19 0616  WBC 5.9 10.1  --   --   --  6.7  RBC 2.80* 3.04*  --   --   --  3.10*  HGB 7.4* 8.0*   < > 8.2* 8.3* 8.1*  HCT 23.6* 25.5*  --  26.5* 27.2* 26.7*  MCV 84.3 83.9  --   --   --  86.1  MCH 26.4 26.3  --   --   --  26.1  MCHC 31.4 31.4  --   --   --  30.3  RDW 19.3* 19.6*  --   --   --  19.9*  PLT 188 208  --   --   --  196   < > = values in this interval not displayed.    BNPNo results for input(s): BNP, PROBNP in the last 168 hours.   DDimer No results for input(s): DDIMER in the last 168 hours.   Radiology    Dg Chest Port  1 View  Result Date: 01/25/2019 CLINICAL DATA:  68 year old female with history of shortness of breath and hypoxia. EXAM: PORTABLE CHEST 1 VIEW COMPARISON:  Chest x-ray 01/20/2019. FINDINGS: Lung volumes are normal. No consolidative airspace disease. No pleural effusions. No pneumothorax. No definite suspicious appearing pulmonary nodules or masses are noted. Cephalization of the pulmonary vasculature. Moderate cardiomegaly. The patient is rotated to the left on today's exam, resulting in distortion of the mediastinal contours and reduced diagnostic sensitivity and specificity for mediastinal pathology. Aortic atherosclerosis. IMPRESSION: 1. Cardiomegaly with pulmonary venous congestion, but no frank pulmonary edema. Electronically Signed   By: Vinnie Langton M.D.   On: 01/25/2019 15:29    Cardiac Studies   2D echo 12/2018: 1. The left ventricle has severely reduced systolic function, with an ejection fraction of 25-30%. The cavity size was mildly dilated. Left ventricular diastolic Doppler parameters are indeterminate. Left ventricular diffuse hypokinesis. 2. The right ventricle has mildly reduced systolic function. The cavity was moderately enlarged. There is no increase in right ventricular wall thickness. Right ventricular systolic pressure is moderately elevated with an estimated pressure of 56.2  mmHg. 3. Left atrial size was severely dilated. 4. Right atrial size was moderately dilated. 5. Tricuspid valve regurgitation is moderate. 6. Rhythm is atrial fibrillation  Patient Profile     68 y.o. female with PMH CAD, HFrEF secondary to ICM, prior hyperkalemia with spironolactone in the setting of renal failure, persistent atrial fibrillation s/p recent DCCV 01/03/2019 that was briefly successful on amiodarone and Coumadin, CVA, anemia, carotid artery disease s/p left-sided CEA, DM, hypertension, hyperlipidemia, and is being followed for management of HFrEF  Assessment & Plan    Acute  on chronic HFrEF secondary to ICM with likely cardiorenal syndrome -difficult situation. Likely still has some volume, but Cr continues to rise. Will try to balance this by switching to oral medication. Need to make sure she has urine output on oral therapy before discharge.  -she is warm on exam, with rising HR will start low dose beta blocker. -admission wt 139 kg, today 132.7 kg. Noted net negative 12L -12/2018 Echo above with EF 25-30%.  -warm on exam today, making adequate changes. If this changes, may need to consider inotrope -Renal dysfunction precludes ACE/ARB/Entreso/Spironolactone.  Persistent atrial flutter/atrial fibrillation with RVR -again, complex situation. Did not maintain sinus rhythm after DCCV 9/25 despite amiodarone -sinus rhythm would  ideally make HF management more successful, but cannot rhythm control without anticoagulation. If she does not have further bleeding, may be able to consider repeat cardioversion, though I am not certain she would remain in sinus for long. -unable to have EGD 10/17. Recommendation was to reintroduce coumadin and watch H/H -would also consider DOAC over coumadin to avoid supratherapeutic INRs (lower risk of GI bleed) with concomitant use of amiodarone. However, will defer this discussion in the short term as coumadin easy to reverse if needed, has gradual climb to therapeutic INR  CAD -Known severe CAD by catheterization 2018.  -Continue medical management as comorbid conditions allow.  GIB/blood loss anemia -per medicine/GI team  AOCKDIII -challenging given volume overload, see above -Cr up to 1.93 today, peak 3.82, nadir 1.75 this admission  Lower extremity edema -Likely multifactorial given anemia, hypoalbuminemia -has venous stasis changes, will trial compression stockings  For questions or updates, please contact [facility] Please consult [email or web address] for contact info under     Signed, Buford Dresser, MD   01/26/2019, 12:01 PM

## 2019-01-26 NOTE — TOC Initial Note (Addendum)
Transition of Care [facility]) - Initial/Assessment Note    Patient Details  Name: Meghan [name] MRN: [id number] Date of Birth: [birth year]  Transition of Care [facility]) CM/SW Contact:    Dynesha Woolen, Lenice Llamas Phone Number: [phone]  01/26/2019, 4:03 PM  Clinical Narrative: Clinical Social Worker (CSW) contacted patient via telephone to complete assessment because patient is on isolation. CSW introduced self and explained role of CSW department. Patient reported that she lives in [place] with her daughter [name]. Patient is agreeable to home health and does not have a home health agency preference. Per [facility] agency representative they can accept patient. Patient is agreeable to [facility]. Per patient she has a walker at home. CSW will continue to follow and assist as needed.                 Expected Discharge Plan: [place] Barriers to Discharge: Continued Medical Work up   Patient Goals and CMS Choice Patient states their goals for this hospitalization and ongoing recovery are:: To go home.      Expected Discharge Plan and Services Expected Discharge Plan: [place] In-house Referral: Clinical Social Work   Post Acute Care Choice: [place] arrangements for the past 2 months: [place]: RN, PT, OT [place] Agency: [place] ([facility]) Date [place]: 01/26/19 Time [place]: 1602 Representative spoke with at [place] Arrangements/Services Living arrangements for the past 2 months: [place] Lives with:: Adult Children Patient language and need for interpreter reviewed:: No Do you feel safe going back to the place where you live?: Yes      Need for Family Participation in Patient Care: Yes (Comment) Care giver support system in place?: Yes (comment) Current home services: DME(Patient has a walker at  home.) Criminal Activity/Legal Involvement Pertinent to Current Situation/Hospitalization: No - Comment as needed  Activities of Daily Living Home Assistive Devices/Equipment: Wheelchair ADL Screening (condition at time of admission) Patient's cognitive ability adequate to safely complete daily activities?: Yes Is the patient deaf or have difficulty hearing?: No Does the patient have difficulty seeing, even when wearing glasses/contacts?: No Does the patient have difficulty concentrating, remembering, or making decisions?: No Patient able to express need for assistance with ADLs?: Yes Does the patient have difficulty dressing or bathing?: Yes Independently performs ADLs?: Yes (appropriate for developmental age) Does the patient have difficulty walking or climbing stairs?: Yes Weakness of Legs: Both Weakness of Arms/Hands: Both  Permission Sought/Granted Permission sought to share information with : Other (comment)(Home Health agency) Permission granted to share information with : Yes, Verbal Permission Granted              Emotional Assessment   Attitude/Demeanor/Rapport: Engaged Affect (typically observed): Calm Orientation: : Oriented to Self, Oriented to Place, Oriented to  Time, Fluctuating Orientation (Suspected and/or reported Sundowners) Alcohol / Substance Use: Not Applicable Psych Involvement: No (comment)  Admission diagnosis:  Rectal bleeding [K62.5] SOB (shortness of breath) [R06.02] Chronic atrial fibrillation (HCC) [I48.20] Warfarin anticoagulation [Z79.01] Severe back pain [M54.9] Anasarca associated with disorder of kidney [N04.9] Anemia, unspecified type [D64.9] Acute renal failure superimposed on chronic kidney disease, unspecified CKD stage, unspecified acute renal failure type ([place]) [N17.9, N18.9] Acute on chronic congestive heart failure, unspecified  heart failure type [facility]) [I50.9] Patient Active Problem List   Diagnosis Date Noted  . Procedure not  carried out because of contraindication   . Melena   . Dilated cardiomyopathy ([place]) 12/15/2018  . Hypoglycemia due to type 2 diabetes mellitus ([place]) 12/15/2018  . Acute on chronic systolic CHF (congestive heart failure) ([place]) 12/13/2018  . Sepsis ([place]) 03/30/2017  . Elevated TSH 12/29/2016  . Psoriasis 12/26/2016  . Obesity, Class III, BMI 40-49.9 (morbid obesity) ([place]) 12/26/2016  . Toe osteomyelitis, left ([place]) 09/21/2016  . CKD (chronic kidney disease), stage III 07/26/2016  . History of CVA (cerebrovascular accident) without residual deficits 09/03/2013  . Hyperlipidemia 09/03/2013  . Type 2 diabetes mellitus with peripheral neuropathy ([place]) 09/03/2013  . Hyperlipidemia due to type 2 diabetes mellitus ([place]) 09/03/2013  . Persistent atrial fibrillation 07/16/2013  . Paroxysmal atrial fibrillation ([place]) 07/16/2013  . Ischemic cardiomyopathy 07/16/2013  . Hypertension 07/16/2013  . Chronic systolic heart failure ([place]) 04/12/2012  . Coronary atherosclerosis   . Dyspnea 03/01/2012   PCP:  [name], MD Pharmacy:   [facility] [address], [place] [address] Phone: [phone] Fax: [phone]     Social Determinants of Health (SDOH) Interventions    Readmission Risk Interventions Readmission Risk Prevention Plan 12/14/2018  Transportation Screening Complete  PCP or Specialist Appt within 5-7 Days Complete  Home Care Screening Complete  Medication Review (RN CM) Complete  Some recent data might be hidden

## 2019-01-26 NOTE — Progress Notes (Signed)
Foley removed per MD order.  

## 2019-01-27 ENCOUNTER — Encounter: Payer: Self-pay | Admitting: Gastroenterology

## 2019-01-27 ENCOUNTER — Telehealth: Payer: Self-pay | Admitting: Gastroenterology

## 2019-01-27 DIAGNOSIS — I5023 Acute on chronic systolic (congestive) heart failure: Secondary | ICD-10-CM | POA: Diagnosis not present

## 2019-01-27 LAB — BASIC METABOLIC PANEL
Anion gap: 8 (ref 5–15)
BUN: 56 mg/dL — ABNORMAL HIGH (ref 8–23)
CO2: 32 mmol/L (ref 22–32)
Calcium: 8.3 mg/dL — ABNORMAL LOW (ref 8.9–10.3)
Chloride: 96 mmol/L — ABNORMAL LOW (ref 98–111)
Creatinine, Ser: 1.95 mg/dL — ABNORMAL HIGH (ref 0.44–1.00)
GFR calc Af Amer: 29 mL/min — ABNORMAL LOW (ref >60)
GFR calc non Af Amer: 25 mL/min — ABNORMAL LOW (ref >60)
Glucose, Bld: 140 mg/dL — ABNORMAL HIGH (ref 70–99)
Potassium: 4 mmol/L (ref 3.5–5.1)
Sodium: 136 mmol/L (ref 135–145)

## 2019-01-27 LAB — CBC
HCT: 25.8 % — ABNORMAL LOW (ref 36.0–46.0)
Hemoglobin: 7.7 g/dL — ABNORMAL LOW (ref 12.0–15.0)
MCH: 26 pg (ref 26.0–34.0)
MCHC: 29.8 g/dL — ABNORMAL LOW (ref 30.0–36.0)
MCV: 87.2 fL (ref 80.0–100.0)
Platelets: 191 10*3/uL (ref 150–400)
RBC: 2.96 MIL/uL — ABNORMAL LOW (ref 3.87–5.11)
RDW: 19.9 % — ABNORMAL HIGH (ref 11.5–15.5)
WBC: 8.7 10*3/uL (ref 4.0–10.5)
nRBC: 0.2 % (ref 0.0–0.2)

## 2019-01-27 LAB — PROTIME-INR
INR: 1.2 (ref 0.8–1.2)
Prothrombin Time: 15.2 seconds (ref 11.4–15.2)

## 2019-01-27 LAB — GLUCOSE, CAPILLARY
Glucose-Capillary: 95 mg/dL (ref 70–99)
Glucose-Capillary: 131 mg/dL — ABNORMAL HIGH (ref 70–99)
Glucose-Capillary: 221 mg/dL — ABNORMAL HIGH (ref 70–99)
Glucose-Capillary: 221 mg/dL — ABNORMAL HIGH (ref 70–99)
Glucose-Capillary: 335 mg/dL — ABNORMAL HIGH (ref 70–99)

## 2019-01-27 LAB — PREPARE RBC (CROSSMATCH)

## 2019-01-27 MED ORDER — WARFARIN SODIUM 6 MG PO TABS
6.0000 mg | ORAL_TABLET | Freq: Once | ORAL | Status: DC
  Filled 2019-01-27: qty 1

## 2019-01-27 MED ORDER — METOPROLOL TARTRATE 25 MG PO TABS
25.0000 mg | ORAL_TABLET | Freq: Two times a day (BID) | ORAL | Status: DC
  Administered 2019-01-27 – 2019-02-04 (×15): 25 mg via ORAL
  Filled 2019-01-27 (×17): qty 1

## 2019-01-27 MED ORDER — SODIUM CHLORIDE 0.9% IV SOLUTION: Freq: Once | INTRAVENOUS | Status: DC

## 2019-01-27 MED ORDER — INSULIN ASPART 100 UNIT/ML ~~LOC~~ SOLN
3.0000 [IU] | Freq: Three times a day (TID) | SUBCUTANEOUS | Status: DC
  Administered 2019-01-27 – 2019-01-28 (×3): 3 [IU] via SUBCUTANEOUS
  Filled 2019-01-27 (×3): qty 1

## 2019-01-27 MED ORDER — FUROSEMIDE 10 MG/ML IJ SOLN
20.0000 mg | Freq: Once | INTRAMUSCULAR | Status: AC
  Administered 2019-01-27: 20 mg via INTRAVENOUS
  Filled 2019-01-27: qty 4

## 2019-01-27 MED ORDER — FUROSEMIDE 10 MG/ML IJ SOLN
80.0000 mg | Freq: Two times a day (BID) | INTRAMUSCULAR | Status: DC
  Administered 2019-01-27 – 2019-02-04 (×17): 80 mg via INTRAVENOUS
  Filled 2019-01-27 (×18): qty 8

## 2019-01-27 MED ORDER — INSULIN GLARGINE 100 UNIT/ML ~~LOC~~ SOLN
20.0000 [IU] | Freq: Every day | SUBCUTANEOUS | Status: DC
  Administered 2019-01-27: 22:00:00 20 [IU] via SUBCUTANEOUS
  Filled 2019-01-27 (×2): qty 0.2

## 2019-01-27 NOTE — Progress Notes (Signed)
OT Cancellation Note  Patient Details Name: Meghan [name] MRN: [id number] DOB: 08-12-1950   Cancelled Treatment:    Reason Eval/Treat Not Completed: Medical issues which prohibited therapy. Chart reviewed. Per notes, pt underwent attempted EGD under general anesthesia on 10/17 but it was aborted 2/2 hypoxia. Due to going under general anesthesia, OT will require new OT orders to continue therapy when pt is medically appropriate. Will complete current order. Please re-consult if/when appropriate.   Jeni Salles, MPH, MS, OTR/L ascom [phone] 01/27/19, 1:43 PM

## 2019-01-27 NOTE — Telephone Encounter (Signed)
No vm to offer apt for Ed f/u apt with Dr. Bonna Gains

## 2019-01-27 NOTE — Progress Notes (Signed)
Patient ID: Meghan [name], female   DOB: 05-29-1950, 68 y.o.   MRN: [id number]  [employer] PROGRESS NOTE  Katrielle Monterosa [id number] DOB: 1950/12/18 DOA: 01/17/2019 PCP: [name], MD  HPI/Subjective: Patient is resting comfortably with no shortness of breath or active bleeding, she is almost been bedbound except for transfers   no overnight incidents  Patients EGD canceled on 01/25/19  as patient was hypoxemic with minimal sedation.  Objective: Vitals:   01/27/19 1146 01/27/19 1316  BP: 127/86   Pulse: (!) 113 (!) 112  Resp: 20   Temp: 97.6 F (36.4 C)   SpO2: 98% 95%    Intake/Output Summary (Last 24 hours) at 01/27/2019 1527 Last data filed at 01/27/2019 1101 Gross per 24 hour  Intake 3 ml  Output 701 ml  Net -698 ml   Filed Weights   01/25/19 0525 01/26/19 0423 01/27/19 0511  Weight: 133.8 kg 132.7 kg 133.1 kg    ROS: Review of Systems  Constitutional: Negative for chills and fever.  Eyes: Negative for blurred vision.  Respiratory: Negative for cough and shortness of breath.   Cardiovascular: Negative for chest pain.  Gastrointestinal: Positive for melena. Negative for abdominal pain, constipation, diarrhea, nausea and vomiting.  Genitourinary: Negative for dysuria.  Musculoskeletal: Negative for joint pain.  Neurological: Negative for dizziness and headaches.   Exam: Physical Exam  Constitutional: She is oriented to person, place, and time.  HENT:  Nose: No mucosal edema.  Mouth/Throat: No oropharyngeal exudate or posterior oropharyngeal edema.  Eyes: Pupils are equal, round, and reactive to light. Conjunctivae, EOM and lids are normal.  Neck: No JVD present. Carotid bruit is not present. No edema present. No thyroid mass and no thyromegaly present.  Cardiovascular: S1 normal and S2 normal. Exam reveals no gallop.  No murmur heard. Pulses:      Dorsalis pedis pulses are 2+ on the right side and 2+ on the left side.  Respiratory: No respiratory  distress. She has decreased breath sounds in the right lower field and the left lower field. She has no wheezes. She has no rhonchi. She has rales in the right lower field and the left lower field.  GI: Soft. Bowel sounds are normal. There is no abdominal tenderness.  Musculoskeletal:     Right ankle: She exhibits swelling.     Left ankle: She exhibits swelling.  Lymphadenopathy:    She has no cervical adenopathy.  Neurological: She is alert and oriented to person, place, and time. No cranial nerve deficit.  Skin: Skin is warm. Nails show no clubbing.  Chronic lower extremity discoloration  Psychiatric: She has a normal mood and affect.      Data Reviewed: Basic Metabolic Panel: Recent Labs  Lab 01/21/19 0457  01/23/19 0523 01/24/19 0524 01/25/19 0442 01/26/19 0616 01/27/19 0542  NA 134*   < > 133* 135 136 134* 136  K 3.5   < > 3.3* 3.1* 3.3* 3.6 4.0  CL 97*   < > 91* 92* 93* 94* 96*  CO2 28   < > 31 33* 33* 31 32  GLUCOSE 166*   < > 132* 158* 95 127* 140*  BUN 53*   < > 47* 55* 57* 56* 56*  CREATININE 2.48*   < > 1.75* 1.83* 1.83* 1.93* 1.95*  CALCIUM 7.8*   < > 8.4* 8.2* 8.3* 8.2* 8.3*  MG 2.2  --  2.0 2.1  --   --   --   PHOS 3.3  --   --   --   --   --   --    < > =  values in this interval not displayed.   CBC: Recent Labs  Lab 01/21/19 0457 01/22/19 0546 01/23/19 0523  01/25/19 0442 01/25/19 1405 01/25/19 1937 01/26/19 0616 01/27/19 0542  WBC 5.2 5.9 10.1  --   --   --   --  6.7 8.7  NEUTROABS 3.3  --  8.7*  --   --   --   --   --   --   HGB 7.6* 7.4* 8.0*   < > 8.0* 8.2* 8.3* 8.1* 7.7*  HCT 24.4* 23.6* 25.5*  --   --  26.5* 27.2* 26.7* 25.8*  MCV 84.4 84.3 83.9  --   --   --   --  86.1 87.2  PLT 206 188 208  --   --   --   --  196 191   < > = values in this interval not displayed.   BNP (last 3 results) Recent Labs    12/13/18 1136 01/17/19 0011  BNP 1,269.0* 790.0*     CBG: Recent Labs  Lab 01/26/19 1701 01/26/19 1935 01/26/19 2111  01/27/19 0734 01/27/19 1147  GLUCAP 258* 255* 225* 131* 221*    No results found for this or any previous visit (from the past 240 hour(s)).    Scheduled Meds: . sodium chloride   Intravenous Once  . atorvastatin  40 mg Oral Daily  . Chlorhexidine Gluconate Cloth  6 each Topical Q0600  . furosemide  20 mg Intravenous Once  . hydrocortisone sod succinate (SOLU-CORTEF) inj  50 mg Intravenous Daily  . insulin aspart  0-5 Units Subcutaneous QHS  . insulin aspart  0-9 Units Subcutaneous TID WC  . insulin aspart  3 Units Subcutaneous TID WC  . insulin glargine  20 Units Subcutaneous QHS  . levothyroxine  50 mcg Oral QAC breakfast  . metoprolol tartrate  12.5 mg Oral BID  . pantoprazole  40 mg Intravenous Q12H  . potassium chloride  20 mEq Oral BID  . sodium chloride flush  3 mL Intravenous Q12H  . torsemide  40 mg Oral Daily   Continuous Infusions: . sodium chloride 0 mL (01/17/19 1010)  . albumin human 12.5 g (01/27/19 1046)    Assessment/Plan:  1. Acute on chronic systolic congestive heart failure.  Patient taken off milrinone and Lasix drip. Lasix dose increased to 60 mg IV from 40 mg holding losartan.  But -17 L and the last 24 hours 400 cc negative output BiPAP nightly.  Pulmonary venous congestion noticed with no pulmonary edema on repeat chest x-ray on 01/25/2019  2. Acute hypoxic respiratory failure.  Patient on 2 L of oxygen and normally she does not wear oxygen.  Wean down as tolerated 3. Cardiogenic shock.  Decrease Solu-Cortef to 50 mg daily dose.  Holding other antihypertensive medications. 4. GI bleed and melena.  Vitamin K given to reverse Coumadin. Reconsulted GI, seen by Dr. Bonna Gains, scheduled for EGD on 01/25/2019 but the procedure was canceled as patient became hypoxemic significantly with minimal sedation.  No active bleeding ,hemoglobin 8.1 -8.0 -8.1, Coumadin resumed has GI and cardiology are agreeable to resume that patient on IV Protonix twice daily.  we will  get tagged RBC scan if patient starts bleeding actively.  01/27/2019 hemoglobin trended down to 7.7, cardiology has recommended to stop Coumadin as the patient failed the Coumadin challenge and also recommending to keep her hemoglobin greater than 8.  1 unit of blood transfusion ordered with IV Lasix prior to the transfusion 5. Acute blood  loss anemia.  Status post multiple transfusions.  GI is recommending bleeding scan if patient starts bleeding again.  GI signed off 6. Acute kidney injury on chronic kidney disease 3 secondary to cardiorenal syndrome.  Creatinine improving with diuresis.  Nephrology is following 7. Persistent atrial fibrillation status post cardioversion and atrial fibrillation.  Amiodarone.  Coumadin discontinued, as patient failed Coumadin challenge.  For frequent PVCs we will continue beta-blocker and cardiology notified 8. type 2 diabetes mellitus on sliding scale insulin and long-acting insulin. 9. History of CVA -discontinue Coumadin in view of GI bleed and failed Coumadin challenge 10. Weakness physical therapy evaluation appreciated.  PT recommending home with home health. 11. Urinary tract infection treated with 5 days of Rocephin.  DC Foley catheter and voiding trial 12.  Head lice-permethrin treatment given on 01/25/2019, will repeat if needed in 72 hours 13.  Generalized weakness physical therapy consult-patient is almost bedbound needs assistance with transfers at her baseline  Code Status:     Code Status Orders  (From admission, onward)         Start     Ordered   01/17/19 0302  Full code  Continuous     01/17/19 0306        Code Status History    Date Active Date Inactive Code Status Order ID Comments User Context   12/13/2018 1415 12/15/2018 1952 Full Code [id number]  Otila Back, MD ED   03/30/2017 2002 04/01/2017 1940 DNR [id number]  Demetrios Loll, MD Inpatient   03/12/2017 1236 03/12/2017 1720 Full Code [id number]  Wellington Hampshire, MD Inpatient   09/21/2016  2056 09/24/2016 1922 Full Code [id number]  Theodoro Grist, MD Inpatient   07/26/2016 1530 07/28/2016 1812 Full Code [id number]  Theodoro Grist, MD Inpatient   Ad[facility] Care Planning Activity     Family Communication: Spoke with daughter at the bedside Disposition Plan: To be determined  Consultants:  Cardiology  Nephrology  Gastroenterology  Time spent: 33 minutes, case discussed with gastroenterology  Nicholes Mango  [employer]

## 2019-01-27 NOTE — Progress Notes (Signed)
Progress Note  Patient Name: Meghan [name] Date of Encounter: 01/27/2019  Primary Cardiologist: Kathlyn Sacramento, MD   Subjective   Patient reports feeling at her baseline volume status.  She reports her breathing continues to improve.  She is eager to go home.  No chest pain. No feelings of racing HR or palpitations.   Denies melena, hematuria, hemoptysis, hematochezia, BRBPR.   Inpatient Medications    Scheduled Meds: . sodium chloride   Intravenous Once  . atorvastatin  40 mg Oral Daily  . Chlorhexidine Gluconate Cloth  6 each Topical Q0600  . furosemide  20 mg Intravenous Once  . hydrocortisone sod succinate (SOLU-CORTEF) inj  50 mg Intravenous Daily  . insulin aspart  0-5 Units Subcutaneous QHS  . insulin aspart  0-9 Units Subcutaneous TID WC  . insulin aspart  3 Units Subcutaneous TID WC  . insulin glargine  20 Units Subcutaneous QHS  . levothyroxine  50 mcg Oral QAC breakfast  . metoprolol tartrate  12.5 mg Oral BID  . pantoprazole  40 mg Intravenous Q12H  . potassium chloride  20 mEq Oral BID  . sodium chloride flush  3 mL Intravenous Q12H  . torsemide  40 mg Oral Daily   Continuous Infusions: . sodium chloride 0 mL (01/17/19 1010)  . albumin human 12.5 g (01/27/19 1046)   PRN Meds: sodium chloride, acetaminophen, HYDROcodone-acetaminophen, ondansetron (ZOFRAN) IV, polyethylene glycol, sodium chloride flush   Vital Signs    Vitals:   01/27/19 1054 01/27/19 1056 01/27/19 1146 01/27/19 1316  BP: 104/73  127/86   Pulse: (!) 102  (!) 113 (!) 112  Resp:   20   Temp:   97.6 F (36.4 C)   TempSrc:   Oral   SpO2:  93% 98% 95%  Weight:      Height:        Intake/Output Summary (Last 24 hours) at 01/27/2019 1528 Last data filed at 01/27/2019 1101 Gross per 24 hour  Intake 3 ml  Output 701 ml  Net -698 ml   Last 3 Weights 01/27/2019 01/26/2019 01/25/2019  Weight (lbs) 293 lb 6.9 oz 292 lb 8.8 oz 294 lb 15.6 oz  Weight (kg) 133.1 kg 132.7 kg 133.8 kg       Telemetry    2:1 Atrial flutter versus NSR with missed beats. Previously atrial fibrillation with rates in the 80-low 100s, PVCs - Personally Reviewed  ECG    No new tracings- Personally Reviewed  Physical Exam   GEN: No acute distress.  Joined by daughter. Lying in the bed. Neck: No JVD though difficult to assess due to body habitus Cardiac:  regular rhythm and rate, 2/6 systolic murmur, rubs, or gallops.  Respiratory: Coarse breath sounds bilaterally GI: soft, nontender MS: moderate to 1+ b/l lower extremity edema; No deformity. Neuro:  Nonfocal  Psych: Normal affect   Labs    High Sensitivity Troponin:   Recent Labs  Lab 01/17/19 0011  TROPONINIHS 13      Cardiac EnzymesNo results for input(s): TROPONINI in the last 168 hours. No results for input(s): TROPIPOC in the last 168 hours.   Chemistry Recent Labs  Lab 01/25/19 0442 01/26/19 0616 01/27/19 0542  NA 136 134* 136  K 3.3* 3.6 4.0  CL 93* 94* 96*  CO2 33* 31 32  GLUCOSE 95 127* 140*  BUN 57* 56* 56*  CREATININE 1.83* 1.93* 1.95*  CALCIUM 8.3* 8.2* 8.3*  GFRNONAA 27* 26* 25*  GFRAA 32* 30* 29*  ANIONGAP  10 9 8      Hematology Recent Labs  Lab 01/23/19 0523  01/25/19 1937 01/26/19 0616 01/27/19 0542  WBC 10.1  --   --  6.7 8.7  RBC 3.04*  --   --  3.10* 2.96*  HGB 8.0*   < > 8.3* 8.1* 7.7*  HCT 25.5*   < > 27.2* 26.7* 25.8*  MCV 83.9  --   --  86.1 87.2  MCH 26.3  --   --  26.1 26.0  MCHC 31.4  --   --  30.3 29.8*  RDW 19.6*  --   --  19.9* 19.9*  PLT 208  --   --  196 191   < > = values in this interval not displayed.    BNPNo results for input(s): BNP, PROBNP in the last 168 hours.   DDimer No results for input(s): DDIMER in the last 168 hours.   Radiology    No results found.  Cardiac Studies   2D echo 12/2018: 1. The left ventricle has severely reduced systolic function, with an ejection fraction of 25-30%. The cavity size was mildly dilated. Left ventricular diastolic Doppler  parameters are indeterminate. Left ventricular diffuse hypokinesis. 2. The right ventricle has mildly reduced systolic function. The cavity was moderately enlarged. There is no increase in right ventricular wall thickness. Right ventricular systolic pressure is moderately elevated with an estimated pressure of 56.2  mmHg. 3. Left atrial size was severely dilated. 4. Right atrial size was moderately dilated. 5. Tricuspid valve regurgitation is moderate. 6. Rhythm is atrial fibrillation  Patient Profile     68 y.o. female with a history of CAD, HFrEF secondary to ICM, prior hyperkalemia with spironolactone in the setting of renal failure, persistent atrial fibrillation s/p recent DCCV 01/03/2019 that was briefly successful on amiodarone and Coumadin, CVA, anemia, carotid artery disease s/p left-sided CEA, DM, hypertension, hyperlipidemia, and is being seen today for the evaluation of HFrEF  Assessment & Plan    Acute on chronic HFrEF secondary to ICM with likely cardiorenal syndrome --Continues to note improving shortness of breath.  Volume status continues to improve from last week with exam notable for less abdominal distention and improving yet still present LEE. Patient also reports she is doing well off of oxygen. Suspect she is nearing her baseline.  --Transitioned again from IV diuresis to oral over the weekend. 17.8L for admission with -1L over last 24h. Daily weights with baseline weight (per office notes) at 131.7kg and most recent admission weights stable at 133.4kg. Previous 2019 were recorded to be closer to 112kg of note. --Continue daily BMET. Bump in Scr and BUN noted form 10/15  10/19: Cr 1.75  1.95; BUN 47  56.  Continue to monitor.  --12/2018 Echo above with EF 25-30%.  --Beta-blocker has been held in the setting of low output heart failure.  --Renal dysfunction precludes ACE/ARB/Entreso/Spironolactone. --Continue oral diuretic at discharge.  Persistent atrial  flutter/atrial fibrillation with RVR --Asymptomatic.S/p recent DCCV 01/03/2019, briefly held SR on amiodarone after cardioversion. Telemetry reads as sinus rhythm but more consistent with 2:1 atrial flutter than NSR with skipped beats. --Rates currently 80s to low 100s with rate control complicated by comorbid conditions. Beta-blocker therapy precluded in the setting of low output heart failure.  Unable to use digoxin due to current renal function.  Avoid CCB with low EF. Avoid IV amiodarone as she is not therapeutically anticoagulated with risk of thrombosis st ill associated with pharmacologic conversion.  --Discontinued anticoagulation due to  anemia / GIB. Anticoagulation was restarted over the weekend per GI. Hgb today 7.7. Patient is currently pending transfusion with goal Hgb above 9.0. Would recommend further GIB workup before restart of anticoagulation, and until that time, current risks of anticoagulation outweigh those of benefits.  --No current plan for DCCV/TEE.  Not therapeutically anticoagulated.  CAD --Known severe CAD by catheterization 2018.  --Continue medical management as comorbid conditions allow.  GIB/blood loss anemia --Anticoagulation discontinued and patient pending transfusion after EGD aborted due to hypoxia.   AOCKDIII --Daily BMET. Bump overnight in Cr as above.  -Avoid nephrotoxins. Renally dose medications, if applicable. Avoid contrast.  Lower extremity edema --Likely multifactorial given anemia, hypoalbuminemia, improving volume status.  For questions or updates, please contact [place] Please consult [email or web address] for contact info under        Signed, Arvil Chaco, PA-C  01/27/2019, 3:28 PM

## 2019-01-27 NOTE — Progress Notes (Signed)
Inpatient Diabetes Program Recommendations  AACE/ADA: New Consensus Statement on Inpatient Glycemic Control   Target Ranges:  Prepandial:   less than 140 mg/dL      Peak postprandial:   less than 180 mg/dL (1-2 hours)      Critically ill patients:  140 - 180 mg/dL   Results for SUKHJIT, ERK (MRN [id number]) as of 01/27/2019 10:30  Ref. Range 01/26/2019 08:12 01/26/2019 11:55 01/26/2019 17:01 01/26/2019 19:35 01/26/2019 21:11 01/27/2019 07:34  Glucose-Capillary Latest Ref Range: 70 - 99 mg/dL 116 (H)     70/30 8 units 182 (H)  Novolog 2 untis  70/30 8 units 258 (H)  Novolog 5 units  70/30 8 units 255 (H) 225 (H)  Novolog 2 units  Lantus 12 units 131 (H)   Review of Glycemic Control  Diabetes history: DM2 Outpatient Diabetes medications: Tresiba 25 units daily, Glipizide 10 mg BID Current orders for Inpatient glycemic control: Lantus 12 units QHS, Novolog 0-9 units TID with meals, Novolog 0-5 units QHS; Solucortef 50 mg daily  Inpatient Diabetes Program Recommendations:   Insulin-Basal: Noted patient was ordered 70/30 8 units TID with meals which was discontinued today. If steroids are continued as ordered, please consider increasing Lantus to 20 units QHS.  Insulin-Meal Coverage: Please consider ordering Novolog 3 units TID with meals for meal coverage if patient eats at least 50% of meals.  Diet: Please consider discontinuing Regular diet and ordering Carb Modified Heart healthy diet.  Thanks, Barnie Alderman, RN, MSN, CDE Diabetes Coordinator Inpatient Diabetes Program [phone] (Team Pager from 8am to 5pm)

## 2019-01-27 NOTE — Telephone Encounter (Signed)
-----   Message from [place] sent at 01/27/2019  1:48 PM EDT ----- Please make a 2 week hospital follow up appointment

## 2019-01-27 NOTE — Plan of Care (Signed)
  Problem: Education: Goal: Knowledge of General Education information will improve Description: Including pain rating scale, medication(s)/side effects and non-pharmacologic comfort measures Outcome: Progressing   Problem: Health Behavior/Discharge Planning: Goal: Ability to manage health-related needs will improve Outcome: Progressing   Problem: Clinical Measurements: Goal: Ability to maintain clinical measurements within normal limits will improve Outcome: Progressing Goal: Will remain free from infection Outcome: Progressing Goal: Diagnostic test results will improve Outcome: Progressing Goal: Respiratory complications will improve Outcome: Progressing Goal: Cardiovascular complication will be avoided Outcome: Progressing   Problem: Activity: Goal: Risk for activity intolerance will decrease Outcome: Progressing   Problem: Elimination: Goal: Will not experience complications related to bowel motility Outcome: Progressing   Problem: Pain Managment: Goal: General experience of comfort will improve Outcome: Progressing   Problem: Safety: Goal: Ability to remain free from injury will improve Outcome: Progressing   Problem: Skin Integrity: Goal: Risk for impaired skin integrity will decrease Outcome: Progressing

## 2019-01-27 NOTE — Progress Notes (Signed)
[facility]  ROUNDING NOTE   Subjective:   UOP 1171mL.  IV albumin daily.   Objective:  Vital signs in last 24 hours:  Temp:  [97.6 F (36.4 C)-98.2 F (36.8 C)] 97.6 F (36.4 C) (10/19 1146) Pulse Rate:  [102-113] 112 (10/19 1316) Resp:  [20] 20 (10/19 1146) BP: (97-127)/(73-87) 127/86 (10/19 1146) SpO2:  [85 %-98 %] 95 % (10/19 1316) Weight:  [133.1 kg] 133.1 kg (10/19 0511)  Weight change: 0.4 kg Filed Weights   01/25/19 0525 01/26/19 0423 01/27/19 0511  Weight: 133.8 kg 132.7 kg 133.1 kg    Intake/Output: I/O last 3 completed shifts: In: 0  Out: 2201 [Urine:2201]   Intake/Output this shift:  Total I/O In: 3 [I.V.:3] Out: -   Physical Exam: General: NAD,   Head: Normocephalic, atraumatic. Moist oral mucosal membranes  Eyes: Anicteric, PERRL  Neck: Supple, trachea midline  Lungs:  Clear to auscultation  Heart: Regular rate and rhythm  Abdomen:  Soft, nontender,   Extremities:  + peripheral edema.  Neurologic: Nonfocal, moving all four extremities  Skin: No lesions        Basic Metabolic Panel: Recent Labs  Lab 01/21/19 0457  01/23/19 0523 01/24/19 0524 01/25/19 0442 01/26/19 0616 01/27/19 0542  NA 134*   < > 133* 135 136 134* 136  K 3.5   < > 3.3* 3.1* 3.3* 3.6 4.0  CL 97*   < > 91* 92* 93* 94* 96*  CO2 28   < > 31 33* 33* 31 32  GLUCOSE 166*   < > 132* 158* 95 127* 140*  BUN 53*   < > 47* 55* 57* 56* 56*  CREATININE 2.48*   < > 1.75* 1.83* 1.83* 1.93* 1.95*  CALCIUM 7.8*   < > 8.4* 8.2* 8.3* 8.2* 8.3*  MG 2.2  --  2.0 2.1  --   --   --   PHOS 3.3  --   --   --   --   --   --    < > = values in this interval not displayed.    Liver Function Tests: No results for input(s): AST, ALT, ALKPHOS, BILITOT, PROT, ALBUMIN in the last 168 hours. No results for input(s): LIPASE, AMYLASE in the last 168 hours. No results for input(s): AMMONIA in the last 168 hours.  CBC: Recent Labs  Lab 01/21/19 0457 01/22/19 0546 01/23/19 0523   01/25/19 0442 01/25/19 1405 01/25/19 1937 01/26/19 0616 01/27/19 0542  WBC 5.2 5.9 10.1  --   --   --   --  6.7 8.7  NEUTROABS 3.3  --  8.7*  --   --   --   --   --   --   HGB 7.6* 7.4* 8.0*   < > 8.0* 8.2* 8.3* 8.1* 7.7*  HCT 24.4* 23.6* 25.5*  --   --  26.5* 27.2* 26.7* 25.8*  MCV 84.4 84.3 83.9  --   --   --   --  86.1 87.2  PLT 206 188 208  --   --   --   --  196 191   < > = values in this interval not displayed.    Cardiac Enzymes: No results for input(s): CKTOTAL, CKMB, CKMBINDEX, TROPONINI in the last 168 hours.  BNP: Invalid input(s): POCBNP  CBG: Recent Labs  Lab 01/26/19 1701 01/26/19 1935 01/26/19 2111 01/27/19 0734 01/27/19 1147  GLUCAP 258* 255* 225* 131* 221*    Microbiology: Results for  orders placed or performed during the hospital encounter of 01/17/19  SARS Coronavirus 2 by RT PCR (hospital order, performed in [facility] hospital lab) Nasopharyngeal Nasopharyngeal Swab     Status: None   Collection Time: 01/17/19  1:41 AM   Specimen: Nasopharyngeal Swab  Result Value Ref Range Status   SARS Coronavirus 2 NEGATIVE NEGATIVE Final    Comment: (NOTE) If result is NEGATIVE SARS-CoV-2 target nucleic acids are NOT DETECTED. The SARS-CoV-2 RNA is generally detectable in upper and lower  respiratory specimens during the acute phase of infection. The lowest  concentration of SARS-CoV-2 viral copies this assay can detect is 250  copies / mL. A negative result does not preclude SARS-CoV-2 infection  and should not be used as the sole basis for treatment or other  patient management decisions.  A negative result may occur with  improper specimen collection / handling, submission of specimen other  than nasopharyngeal swab, presence of viral mutation(s) within the  areas targeted by this assay, and inadequate number of viral copies  (<250 copies / mL). A negative result must be combined with clinical  observations, patient history, and epidemiological  information. If result is POSITIVE SARS-CoV-2 target nucleic acids are DETECTED. The SARS-CoV-2 RNA is generally detectable in upper and lower  respiratory specimens dur ing the acute phase of infection.  Positive  results are indicative of active infection with SARS-CoV-2.  Clinical  correlation with patient history and other diagnostic information is  necessary to determine patient infection status.  Positive results do  not rule out bacterial infection or co-infection with other viruses. If result is PRESUMPTIVE POSTIVE SARS-CoV-2 nucleic acids MAY BE PRESENT.   A presumptive positive result was obtained on the submitted specimen  and confirmed on repeat testing.  While 2019 novel coronavirus  (SARS-CoV-2) nucleic acids may be present in the submitted sample  additional confirmatory testing may be necessary for epidemiological  and / or clinical management purposes  to differentiate between  SARS-CoV-2 and other Sarbecovirus currently known to infect humans.  If clinically indicated additional testing with an alternate test  methodology [phone]) is advised. The SARS-CoV-2 RNA is generally  detectable in upper and lower respiratory sp ecimens during the acute  phase of infection. The expected result is Negative. Fact Sheet for Patients:  StrictlyIdeas.no Fact Sheet for Healthcare Providers: BankingDealers.co.za This test is not yet approved or cleared by the [place] FDA and has been authorized for detection and/or diagnosis of SARS-CoV-2 by FDA under an Emergency Use Authorization (EUA).  This EUA will remain in effect (meaning this test can be used) for the duration of the COVID-19 declaration under Section 564(b)(1) of the Act, 21 U.S.C. section 360bbb-3(b)(1), unless the authorization is terminated or revoked sooner. Performed at [facility], [address] [place], [place] [address]   MRSA PCR Screening      Status: None   Collection Time: 01/17/19  4:44 AM   Specimen: Nasal Mucosa; Nasopharyngeal  Result Value Ref Range Status   MRSA by PCR NEGATIVE NEGATIVE Final    Comment:        The GeneXpert MRSA Assay (FDA approved for NASAL specimens only), is one component of a comprehensive MRSA colonization surveillance program. It is not intended to diagnose MRSA infection nor to guide or monitor treatment for MRSA infections. Performed at [facility], [address] [place], [place] [address]   Urine Culture     Status: Abnormal   Collection Time: 01/17/19 11:52  AM   Specimen: Urine, Clean Catch  Result Value Ref Range Status   Specimen Description   Final    URINE, CLEAN CATCH Performed at [facility], [address] [place], [place] [address]    Special Requests   Final    Normal Performed at [facility], [place], [place] [address]    Culture >=100,000 COLONIES/mL ESCHERICHIA COLI (A)  Final   Report Status 01/19/2019 FINAL  Final   Organism ID, Bacteria ESCHERICHIA COLI (A)  Final      Susceptibility   Escherichia coli - MIC*    AMPICILLIN >=32 RESISTANT Resistant     CEFAZOLIN <=4 SENSITIVE Sensitive     CEFTRIAXONE <=1 SENSITIVE Sensitive     CIPROFLOXACIN >=4 RESISTANT Resistant     GENTAMICIN <=1 SENSITIVE Sensitive     IMIPENEM <=0.25 SENSITIVE Sensitive     NITROFURANTOIN <=16 SENSITIVE Sensitive     TRIMETH/SULFA <=20 SENSITIVE Sensitive     AMPICILLIN/SULBACTAM 8 SENSITIVE Sensitive     PIP/TAZO <=4 SENSITIVE Sensitive     Extended ESBL NEGATIVE Sensitive     * >=100,000 COLONIES/mL ESCHERICHIA COLI    Coagulation Studies: Recent Labs    01/24/19 1719 01/26/19 0616 01/27/19 0542  LABPROT 14.9 15.2 15.2  INR 1.2 1.2 1.2    Urinalysis: No results for input(s): COLORURINE, LABSPEC, PHURINE, GLUCOSEU, HGBUR, BILIRUBINUR, KETONESUR, PROTEINUR, UROBILINOGEN, NITRITE, LEUKOCYTESUR in the last 72  hours.  Invalid input(s): APPERANCEUR    Imaging: No results found.   Medications:   . sodium chloride 0 mL (01/17/19 1010)  . albumin human 12.5 g (01/27/19 1046)   . sodium chloride   Intravenous Once  . atorvastatin  40 mg Oral Daily  . Chlorhexidine Gluconate Cloth  6 each Topical Q0600  . furosemide  20 mg Intravenous Once  . hydrocortisone sod succinate (SOLU-CORTEF) inj  50 mg Intravenous Daily  . insulin aspart  0-5 Units Subcutaneous QHS  . insulin aspart  0-9 Units Subcutaneous TID WC  . insulin aspart  3 Units Subcutaneous TID WC  . insulin glargine  20 Units Subcutaneous QHS  . levothyroxine  50 mcg Oral QAC breakfast  . metoprolol tartrate  12.5 mg Oral BID  . pantoprazole  40 mg Intravenous Q12H  . potassium chloride  20 mEq Oral BID  . sodium chloride flush  3 mL Intravenous Q12H  . torsemide  40 mg Oral Daily   sodium chloride, acetaminophen, HYDROcodone-acetaminophen, ondansetron (ZOFRAN) IV, polyethylene glycol, sodium chloride flush  Assessment/ Plan:  Ms. Meghan [name] is a 68 y.o. white female with chronic systolic congestive heart failure, coronary disease, atrial fibrillation on anticoagulation, diabetes mellitus type II, anemia, carotid artery disease with history of left carotid endarterectomy, stroke, hypertension, chronic kidney disease stage III, was admitted on10/9/2020with shortness of breath worsening edema, acute kidney injury.  1. Acute kidney injury on Chronic kidney disease stage III with proteinuria and hematuria Baseline creatinine of 1.15, GFR of 49 on 06/10/18.  Chronic kidney disease secondary to diabetic nephropathy Acute renal failure secondary to acute cardiorenal syndrome    2. Acute exacerbation of systolic congestive heart failure.   3. Hypertension  4. Urinary tract infection: E. Coli. Completed course of antibiotics.   Plan  - Continue torsemide PO   LOS: [place] 10/19/20203:32 PM

## 2019-01-27 NOTE — Consult Note (Signed)
ANTICOAGULATION CONSULT NOTE - Follow Up Consult  Pharmacy Consult for Warfarin Indication: atrial fibrillation/hx CVA  Patient Measurements: Height: 5\' 7"  (170.2 cm) Weight: 293 lb 6.9 oz (133.1 kg) IBW/kg (Calculated) : 61.6  Vital Signs: Temp: 98.2 F (36.8 C) (10/19 0331) Temp Source: Oral (10/19 0331) BP: 97/74 (10/19 0331) Pulse Rate: 110 (10/19 0331)  Labs: Recent Labs    01/24/19 1719 01/25/19 0442  01/25/19 1937 01/26/19 0616 01/27/19 0542  HGB 8.3* 8.0*   < > 8.3* 8.1* 7.7*  HCT  --   --    < > 27.2* 26.7* 25.8*  PLT  --   --   --   --  196 191  LABPROT 14.9  --   --   --  15.2 15.2  INR 1.2  --   --   --  1.2 1.2  CREATININE  --  1.83*  --   --  1.93* 1.95*   < > = values in this interval not displayed.    Estimated Creatinine Clearance: 38.2 mL/min (A) (by C-G formula based on SCr of 1.95 mg/dL (H)).   Medications:  Medications Prior to Admission  Medication Sig Dispense Refill Last Dose  . amiodarone (PACERONE) 200 MG tablet Take 200 mg by mouth 2 (two) times daily.   Unknown at Unknown  . atorvastatin (LIPITOR) 40 MG tablet Take 1 tablet by mouth once daily (Patient taking differently: Take 40 mg by mouth daily. ) 90 tablet 0 Unknown at Unknown  . carvedilol (COREG) 6.25 MG tablet TAKE 1 TABLET BY MOUTH TWICE DAILY WITH A MEAL 180 tablet 0 Unknown at Unknown  . glipiZIDE (GLUCOTROL) 10 MG tablet Take 10 mg by mouth 2 (two) times daily. Dr Honor Junes   Unknown at Unknown  . Insulin Degludec 200 UNIT/ML SOPN Inject 25 Units into the skin daily at 12 noon. Dr Honor Junes (1300)   Unknown at Unknown  . levothyroxine (SYNTHROID) 50 MCG tablet Take 50 mcg by mouth daily before breakfast.   Unknown at Unknown  . losartan (COZAAR) 25 MG tablet Take 1 tablet (25 mg total) by mouth daily. 30 tablet 11 Unknown at Unknown  . torsemide (DEMADEX) 20 MG tablet Take 2 tablets (40 mg total) by mouth 2 (two) times daily. 120 tablet 4 Unknown at Unknown  . triamcinolone cream  (KENALOG) 0.5 % Apply 1 application topically 2 (two) times daily as needed (psoriasis).    prn at prn  . warfarin (COUMADIN) 5 MG tablet USE AS DIRECTED BY COUMADIN CLINIC FOR 30 DAYS (Patient not taking: Take one 5 mg tablet Sunday, Tuesday, Wednesday, Thursday, and Saturday. And one and one-half tablets 7.5 mg on Monday and Friday) 45 tablet 3 Not Taking at Unknown time    Assessment: 68 year old female with no current active bleeding and normal other bowel movements with blood since admission and hemoglobin is stable.  Patients EGD canceled today as patient was hypoxemic with minimal sedation.  Pharmacy has been consulted to re-initiate and manage warfarin therapy post-bleed with the caveat to "start at low dose." Home regimen 5 mg all days except 7.5 mg on Monday and Friday.     DDI: no new DDI since admission  Date   INR   Dose 10/17   1.2   4 mg 10/18   1.2   4 mg 10/19   1.2     Goal of Therapy:  INR 2-3 Monitor platelets by anticoagulation protocol: Yes   Plan:   Will  give warfarin 6 mg x 1 this evening which is approximately her average home dose of warfarin  INR with morning labs.   CBC at least every 3 days per protocol  Dallie Piles, PharmD Clinical Pharmacist 01/27/2019 9:17 AM

## 2019-01-27 NOTE — Consult Note (Signed)
PHARMACY CONSULT NOTE  Pharmacy Consult for Electrolyte Monitoring and Replacement   Recent Labs: Potassium (mmol/L)  Date Value  01/27/2019 4.0  07/10/2013 3.6   Magnesium (mg/dL)  Date Value  01/24/2019 2.1  07/10/2013 1.5 (L)   Calcium (mg/dL)  Date Value  01/27/2019 8.3 (L)   Calcium, Total (mg/dL)  Date Value  07/10/2013 8.1 (L)   Albumin (g/dL)  Date Value  01/17/2019 2.2 (L)  06/15/2017 3.5 (L)  07/08/2013 1.7 (L)   Phosphorus (mg/dL)  Date Value  01/21/2019 3.3   Sodium (mmol/L)  Date Value  01/27/2019 136  12/24/2018 135  07/10/2013 134 (L)   Corrected Ca: 9.74 mg/dL  Assessment: 68 y.o. female with medical problems of chronic systolic congestive heart failure, coronary disease, atrial fibrillation requiring anticoagulation, diabetes, anemia, carotid artery disease with history of left carotid endarterectomy, previous stroke, hypertension, chronic kidney disease, was admitted on10/9/2020with shortness of breath worsening edema, acute kidney injury. She was on furosemide 60 mg IV BID which was d/c yesterday and changed to torsemide 40 mg daily and she remains on oral KCl 20 mEq BID  Goal of Therapy:  Given cardiac history: Potassium 4.0 - 5.1 mmol/L Magnesium: 2.0 - 2.4 mg/dL Other electrolytes WNL  Plan:   Continue potassium 20 mEq PO BID  No additional electrolyte supplementation warranted at this time  F/U electrolytes, including magnesium, with am labs.  Dallie Piles ,PharmD Clinical Pharmacist 01/27/2019 9:29 AM

## 2019-01-28 ENCOUNTER — Inpatient Hospital Stay: Payer: Self-pay

## 2019-01-28 DIAGNOSIS — I5023 Acute on chronic systolic (congestive) heart failure: Secondary | ICD-10-CM | POA: Diagnosis not present

## 2019-01-28 DIAGNOSIS — I4819 Other persistent atrial fibrillation: Secondary | ICD-10-CM | POA: Diagnosis not present

## 2019-01-28 LAB — BPAM RBC
Blood Product Expiration Date: 202011252359
ISSUE DATE / TIME: 202010192002
Unit Type and Rh: 5100

## 2019-01-28 LAB — TYPE AND SCREEN
ABO/RH(D): O POS
Antibody Screen: NEGATIVE
Unit division: 0

## 2019-01-28 LAB — HEMOGLOBIN AND HEMATOCRIT, BLOOD
HCT: 28 % — ABNORMAL LOW (ref 36.0–46.0)
HCT: 29.8 % — ABNORMAL LOW (ref 36.0–46.0)
Hemoglobin: 8.6 g/dL — ABNORMAL LOW (ref 12.0–15.0)
Hemoglobin: 9.2 g/dL — ABNORMAL LOW (ref 12.0–15.0)

## 2019-01-28 LAB — CBC
HCT: 28.2 % — ABNORMAL LOW (ref 36.0–46.0)
Hemoglobin: 8.8 g/dL — ABNORMAL LOW (ref 12.0–15.0)
MCH: 26.6 pg (ref 26.0–34.0)
MCHC: 31.2 g/dL (ref 30.0–36.0)
MCV: 85.2 fL (ref 80.0–100.0)
Platelets: 184 10*3/uL (ref 150–400)
RBC: 3.31 MIL/uL — ABNORMAL LOW (ref 3.87–5.11)
RDW: 19.5 % — ABNORMAL HIGH (ref 11.5–15.5)
WBC: 7.5 10*3/uL (ref 4.0–10.5)
nRBC: 0.3 % — ABNORMAL HIGH (ref 0.0–0.2)

## 2019-01-28 LAB — BASIC METABOLIC PANEL
Anion gap: 11 (ref 5–15)
BUN: 58 mg/dL — ABNORMAL HIGH (ref 8–23)
CO2: 29 mmol/L (ref 22–32)
Calcium: 8.4 mg/dL — ABNORMAL LOW (ref 8.9–10.3)
Chloride: 92 mmol/L — ABNORMAL LOW (ref 98–111)
Creatinine, Ser: 2.2 mg/dL — ABNORMAL HIGH (ref 0.44–1.00)
GFR calc Af Amer: 25 mL/min — ABNORMAL LOW (ref >60)
GFR calc non Af Amer: 22 mL/min — ABNORMAL LOW (ref >60)
Glucose, Bld: 235 mg/dL — ABNORMAL HIGH (ref 70–99)
Potassium: 4.1 mmol/L (ref 3.5–5.1)
Sodium: 132 mmol/L — ABNORMAL LOW (ref 135–145)

## 2019-01-28 LAB — GLUCOSE, CAPILLARY
Glucose-Capillary: 197 mg/dL — ABNORMAL HIGH (ref 70–99)
Glucose-Capillary: 229 mg/dL — ABNORMAL HIGH (ref 70–99)
Glucose-Capillary: 313 mg/dL — ABNORMAL HIGH (ref 70–99)
Glucose-Capillary: 436 mg/dL — ABNORMAL HIGH (ref 70–99)
Glucose-Capillary: 312 mg/dL — ABNORMAL HIGH (ref 70–99)

## 2019-01-28 LAB — PROTIME-INR
INR: 1.3 — ABNORMAL HIGH (ref 0.8–1.2)
Prothrombin Time: 15.7 seconds — ABNORMAL HIGH (ref 11.4–15.2)

## 2019-01-28 LAB — MAGNESIUM: Magnesium: 2.1 mg/dL (ref 1.7–2.4)

## 2019-01-28 MED ORDER — INSULIN ASPART 100 UNIT/ML ~~LOC~~ SOLN
5.0000 [IU] | Freq: Three times a day (TID) | SUBCUTANEOUS | Status: DC
  Administered 2019-01-28: 5 [IU] via SUBCUTANEOUS

## 2019-01-28 MED ORDER — INSULIN ASPART 100 UNIT/ML ~~LOC~~ SOLN
6.0000 [IU] | Freq: Three times a day (TID) | SUBCUTANEOUS | Status: DC
  Administered 2019-01-28 – 2019-02-01 (×13): 6 [IU] via SUBCUTANEOUS
  Filled 2019-01-28 (×11): qty 1

## 2019-01-28 MED ORDER — PREDNISONE 20 MG PO TABS
20.0000 mg | ORAL_TABLET | Freq: Every day | ORAL | Status: DC
  Administered 2019-01-29 – 2019-02-08 (×11): 20 mg via ORAL
  Filled 2019-01-28 (×8): qty 1
  Filled 2019-01-28: qty 2
  Filled 2019-01-28 (×2): qty 1

## 2019-01-28 MED ORDER — INSULIN GLARGINE 100 UNIT/ML ~~LOC~~ SOLN
22.0000 [IU] | Freq: Every day | SUBCUTANEOUS | Status: DC
  Administered 2019-01-28 – 2019-02-01 (×5): 22 [IU] via SUBCUTANEOUS
  Filled 2019-01-28 (×5): qty 0.22

## 2019-01-28 MED ORDER — ADULT MULTIVITAMIN W/MINERALS CH
1.0000 | ORAL_TABLET | Freq: Every day | ORAL | Status: DC
  Administered 2019-01-29 – 2019-02-08 (×11): 1 via ORAL
  Filled 2019-01-28 (×11): qty 1

## 2019-01-28 MED ORDER — MILRINONE LACTATE IN DEXTROSE 20-5 MG/100ML-% IV SOLN
0.2500 ug/kg/min | INTRAVENOUS | Status: DC
  Administered 2019-01-28 – 2019-02-06 (×20): 0.25 ug/kg/min via INTRAVENOUS
  Filled 2019-01-28 (×23): qty 100

## 2019-01-28 NOTE — Progress Notes (Addendum)
Per MD patient will transfer to ICU for management of Milrinone infusion. Patients oxygen saturations are maintaining in the upper 90's on 2L via [place]. Patient reports some shortness of breath on exertion but overall is breaching much better. Patient also concerned about urinary retention that occurred overnight. Patient has currently urinated over 1267ml this shift. Transfer orders to ICU in place. Report given to [place]. Per IV team they cannot start PICC line as they were instructed not to by Nephrologist. They request we discontinue order and start tunneled catheter.

## 2019-01-28 NOTE — Progress Notes (Signed)
Physical Therapy Treatment Patient Details Name: Meghan [name] MRN: [id number] DOB: 1950-08-20 Today's Date: 01/28/2019    History of Present Illness From MD H&P: Pt is a 68 y.o. female with pertinent past medical history of chronic systolic heart failure CAD, persistent atrial fibrillation s/p cardioversion on Coumadin, diabetes mellitus, chronic anemia, CKD, hyperlipidemia, carotid artery disease s/p left carotid endarterectomy with previous CVA, and hypertension into the ED with chief complaints of right flank pain, shortness of breath and rectal bleed.  MD assessment includes: Progressive cardio-renal syndrome in setting of morbid obesity, systolic cardiac failure with EF 25%, GI bleed, AKI/renal failure, and severe hypoglycemia.    PT Comments    Pt sleeping in recliner upon PT arrival and requesting to use BSC and then back to bed.  Pt performed 180 degree turn transfer recliner to [facility] and then 270 degree turn BSC to bed (transfer method per pt baseline preference and comfort).  Overall pt steady and safe with above transfers.  Trialed pt on room air per nursing request.  Pt's O2 sats 93% at rest on room air but decreased to 84-85% with transfer (improved back to 92% within a few minutes of sitting rest and pursed lip breathing)--MD and nurse notified.  Will continue to focus on strengthening and progressive functional mobility per pt tolerance.   Follow Up Recommendations  Home health PT;Supervision for mobility/OOB     Equipment Recommendations  (pt has needed DME at home already)    Recommendations for Other Services       Precautions / Restrictions Precautions Precautions: Fall Precaution Comments: Keep O2 >/= 92% Restrictions Weight Bearing Restrictions: No    Mobility  Bed Mobility Overal bed mobility: Needs Assistance Bed Mobility: Sit to Supine;Rolling Rolling: Supervision(use of bed rail to roll to R side to adjust sheets under pt)     Sit to supine: Min assist;HOB  elevated   General bed mobility comments: assist for R LE; pt using bedrails to assist  Transfers Overall transfer level: Needs assistance Equipment used: None Transfers: Stand Pivot Transfers   Stand pivot transfers: Min guard       General transfer comment: stand step turn recliner to BSC (180 degree turn with B UE support on recliner and BSC armrests) and then 270 degree turn BSC to bed (B UE support on recliner and BSC armrests for transfer); transfer technique per pt preference  Ambulation/Gait             General Gait Details: Deferred (pt and pt's daughter reporting pt non-ambulatory baseline)   Marine scientist Rankin (Stroke Patients Only)       Balance Overall balance assessment: Needs assistance Sitting-balance support: No upper extremity supported;Feet supported Sitting balance-Leahy Scale: Normal Sitting balance - Comments: steady sitting reaching outside BOS                                    Cognition Arousal/Alertness: Awake/alert Behavior During Therapy: WFL for tasks assessed/performed Overall Cognitive Status: Within Functional Limits for tasks assessed                                        Exercises      General Comments General comments (skin integrity, edema, etc.): B  LE edema  Nursing cleared pt for participation in physical therapy.  Pt agreeable to PT session.  [name] arrived beginning of session and MD Gouru present end of session.      Pertinent Vitals/Pain Pain Assessment: No/denies pain Pain Intervention(s): Limited activity within patient's tolerance;Monitored during session;Repositioned  HR WFL during session's activities.    Home Living                      Prior Function            PT Goals (current goals can now be found in the care plan section) Acute Rehab PT Goals Patient Stated Goal: To return to PLOF PT Goal Formulation:  With patient Time For Goal Achievement: 01/31/19 Potential to Achieve Goals: Good Progress towards PT goals: Progressing toward goals    Frequency    Min 2X/week      PT Plan Current plan remains appropriate    Co-evaluation              AM-PAC PT "6 Clicks" Mobility   Outcome Measure  Help needed turning from your back to your side while in a flat bed without using bedrails?: A Little Help needed moving from lying on your back to sitting on the side of a flat bed without using bedrails?: A Little Help needed moving to and from a bed to a chair (including a wheelchair)?: A Little Help needed standing up from a chair using your arms (e.g., wheelchair or bedside chair)?: A Little Help needed to walk in hospital room?: Total Help needed climbing 3-5 steps with a railing? : Total 6 Click Score: 14    End of Session Equipment Utilized During Treatment: Oxygen Activity Tolerance: Patient tolerated treatment well Patient left: in bed;with call bell/phone within reach;with bed alarm set;with family/visitor present;Other (comment)(B heels floating via pillow) Nurse Communication: Mobility status;Precautions;Other (comment)(Pt's O2 sats during session) PT Visit Diagnosis: Muscle weakness (generalized) (M62.81);Difficulty in walking, not elsewhere classified (R26.2)     Time: [id number] PT Time Calculation (min) (ACUTE ONLY): 38 min  Charges:  $Therapeutic Activity: 38-52 mins                     Leitha Bleak, PT 01/28/19, 11:46 AM [phone]

## 2019-01-28 NOTE — Progress Notes (Signed)
Progress Note  Patient Name: Meghan [name] Date of Encounter: 01/28/2019  Primary Cardiologist: Kathlyn Sacramento, MD   Subjective   Patient feels generally unwell but cannot elaborate further.  She is still short of breath in bed at times.  She denies chest pain and palpitations.  Her daughter feels like Meghan [name] is more edematous today.  She also did not have much urine output following IV furosemide yesterday.  Inpatient Medications    Scheduled Meds: . sodium chloride   Intravenous Once  . atorvastatin  40 mg Oral Daily  . Chlorhexidine Gluconate Cloth  6 each Topical Q0600  . furosemide  80 mg Intravenous BID  . insulin aspart  0-5 Units Subcutaneous QHS  . insulin aspart  0-9 Units Subcutaneous TID WC  . insulin aspart  6 Units Subcutaneous TID WC  . insulin glargine  22 Units Subcutaneous QHS  . levothyroxine  50 mcg Oral QAC breakfast  . metoprolol tartrate  25 mg Oral BID  . pantoprazole  40 mg Intravenous Q12H  . potassium chloride  20 mEq Oral BID  . [START ON 01/29/2019] predniSONE  20 mg Oral Q breakfast  . sodium chloride flush  3 mL Intravenous Q12H   Continuous Infusions: . sodium chloride 0 mL (01/17/19 1010)  . albumin human 12.5 g (01/28/19 0817)   PRN Meds: sodium chloride, acetaminophen, HYDROcodone-acetaminophen, ondansetron (ZOFRAN) IV, polyethylene glycol, sodium chloride flush   Vital Signs    Vitals:   01/27/19 2028 01/27/19 2302 01/28/19 0420 01/28/19 1211  BP: (!) 122/95 (!) 128/96 123/76 133/88  Pulse: (!) 107 (!) 113 84 (!) 110  Resp: 18 18 19    Temp: (!) 97.4 F (36.3 C) (!) 97.5 F (36.4 C) 97.7 F (36.5 C) (!) 97.4 F (36.3 C)  TempSrc:  Oral Oral Oral  SpO2: 98% 98% 99% 97%  Weight:   134.3 kg   Height:        Intake/Output Summary (Last 24 hours) at 01/28/2019 1343 Last data filed at 01/28/2019 0817 Gross per 24 hour  Intake 587.34 ml  Output 900 ml  Net -312.66 ml   Last 3 Weights 01/28/2019 01/27/2019 01/26/2019  Weight  (lbs) 296 lb 1.2 oz 293 lb 6.9 oz 292 lb 8.8 oz  Weight (kg) 134.3 kg 133.1 kg 132.7 kg      Telemetry    Not currently on telemetry.  ECG    No new tracing.  Physical Exam   GEN: No acute distress.   Neck: JVP > 12 cm. Cardiac: RRR with 1/6 systolic murmur. Respiratory: Breath sounds mildly diminished at both lung bases.  No wheezes. GI: Soft, nontender, non-distended  MS: 3+ pitting edema in both calves. Neuro:  Nonfocal  Psych: Normal affect   Labs    High Sensitivity Troponin:   Recent Labs  Lab 01/17/19 0011  TROPONINIHS 13      Chemistry Recent Labs  Lab 01/26/19 0616 01/27/19 0542 01/28/19 0523  NA 134* 136 132*  K 3.6 4.0 4.1  CL 94* 96* 92*  CO2 31 32 29  GLUCOSE 127* 140* 235*  BUN 56* 56* 58*  CREATININE 1.93* 1.95* 2.20*  CALCIUM 8.2* 8.3* 8.4*  GFRNONAA 26* 25* 22*  GFRAA 30* 29* 25*  ANIONGAP 9 8 11      Hematology Recent Labs  Lab 01/26/19 0616 01/27/19 0542 01/28/19 0105 01/28/19 0523  WBC 6.7 8.7  --  7.5  RBC 3.10* 2.96*  --  3.31*  HGB 8.1* 7.7* 8.6*  8.8*  HCT 26.7* 25.8* 28.0* 28.2*  MCV 86.1 87.2  --  85.2  MCH 26.1 26.0  --  26.6  MCHC 30.3 29.8*  --  31.2  RDW 19.9* 19.9*  --  19.5*  PLT 196 191  --  184    BNPNo results for input(s): BNP, PROBNP in the last 168 hours.   DDimer No results for input(s): DDIMER in the last 168 hours.   Radiology    No results found.  Cardiac Studies   TTE (12/14/2018): 1. The left ventricle has severely reduced systolic function, with an ejection fraction of 25-30%. The cavity size was mildly dilated. Left ventricular diastolic Doppler parameters are indeterminate. Left ventricular diffuse hypokinesis. 2. The right ventricle has mildly reduced systolic function. The cavity was moderately enlarged. There is no increase in right ventricular wall thickness. Right ventricular systolic pressure is moderately elevated with an estimated pressure of 56.2  mmHg. 3. Left atrial size was  severely dilated. 4. Right atrial size was moderately dilated. 5. Tricuspid valve regurgitation is moderate. 6. Rhythm is atrial fibrillation  Patient Profile     68 y.o. female  with a history of CAD, HFrEF secondary to ICM, prior hyperkalemia with spironolactone in the setting of renal failure, persistent atrial fibrillation s/p recent DCCV 01/03/2019 that was briefly successful on amiodarone and Coumadin, CVA, anemia, carotid artery disease s/p left-sided CEA, DM, hypertension, and hyperlipidemia, admitted with acute on chronic HFrEF complicated by atrial fibrillation with rapid ventricular response and acute blood loss anemia due to GI bleed.  Assessment & Plan    Acute on chronic HFrEF: Patient still appears significant volume overloaded with BLE edema and elevated JVP.  Urine output was scant yesterday and weight is up 1 pound.  Creatinine has also worsened overnight.  Recommend transferring to ICU and initiating milrinone 0.25 mcg/kg/min.  Continue furosemide 80 mg IV BID; if urine output does not respond adequately (neg negative fluid balance of at least 2L in 24 hours), transitioning to a furosemide infusion will need to be considered.  Continue metoprolol tartrate 25 mg BID for now; will need to transition carefully to evidence-based beta-blocker prior to discharge.  No ACEI/ARB/ARNI or AA in setting of acute on chronic kidney disease.  If BP tolerates, could consider hydralazine and long-acting nitrate in the future.  Persistent atrial fibrillation: Patient not on telemetry at this time; recorded heart rates are borderline.  Continue metoprolol tartrate 25 mg BID for now.  Anticoagulation on hold in the setting of recent acute blood loss anemia from GI bleed.  Acute on chronic kidney disease: I worry that worsening renal function is a manifestation of low-output heart failure.  Appreciate nephrology recommendations.  Initiate milrinone, as above, and continue  aggressive diuresis.  CAD:  Continue secondary prevention with atorvastatin.  Aspirin on hold in the setting of GI bleed during this admission.  For questions or updates, please contact [place] Please consult [email or web address] for contact info under [facility].     Signed, Nelva Bush, MD  01/28/2019, 1:43 PM

## 2019-01-28 NOTE — Progress Notes (Signed)
Patient ID: Meghan [name], female   DOB: 1950/05/18, 68 y.o.   MRN: [id number]  [employer] PROGRESS NOTE  Meghan [name] [id number] DOB: 08/30/1950 DOA: 01/17/2019 PCP: [name], MD  HPI/Subjective: Patient with no shortness of breath or active bleeding, she is almost been bedbound except for transfers  Urine output not documented accurately, but according to RN patient had approximately 1000 mL of urine output in the past 2 shifts.  Daughter at bedside and thinks patient is fluid overloaded  Patients EGD canceled on 01/25/19  as patient was hypoxemic with minimal sedation.  Objective: Vitals:   01/28/19 0420 01/28/19 1211  BP: 123/76 133/88  Pulse: 84 (!) 110  Resp: 19   Temp: 97.7 F (36.5 C) (!) 97.4 F (36.3 C)  SpO2: 99% 97%    Intake/Output Summary (Last 24 hours) at 01/28/2019 1358 Last data filed at 01/28/2019 0817 Gross per 24 hour  Intake 587.34 ml  Output 900 ml  Net -312.66 ml   Filed Weights   01/26/19 0423 01/27/19 0511 01/28/19 0420  Weight: 132.7 kg 133.1 kg 134.3 kg    ROS: Review of Systems  Constitutional: Negative for chills and fever.  Eyes: Negative for blurred vision.  Respiratory: Negative for cough and shortness of breath.   Cardiovascular: Negative for chest pain.  Gastrointestinal: Positive for melena. Negative for abdominal pain, constipation, diarrhea, nausea and vomiting.  Genitourinary: Negative for dysuria.  Musculoskeletal: Negative for joint pain.  Neurological: Negative for dizziness and headaches.   Exam: Physical Exam  Constitutional: She is oriented to person, place, and time.  HENT:  Nose: No mucosal edema.  Mouth/Throat: No oropharyngeal exudate or posterior oropharyngeal edema.  Eyes: Pupils are equal, round, and reactive to light. Conjunctivae, EOM and lids are normal.  Neck: No JVD present. Carotid bruit is not present. No edema present. No thyroid mass and no thyromegaly present.  Cardiovascular: S1 normal and  S2 normal. Exam reveals no gallop.  No murmur heard. Pulses:      Dorsalis pedis pulses are 2+ on the right side and 2+ on the left side.  Respiratory: No respiratory distress. She has decreased breath sounds in the right lower field and the left lower field. She has no wheezes. She has no rhonchi. She has rales in the right lower field and the left lower field.  GI: Soft. Bowel sounds are normal. There is no abdominal tenderness.  Musculoskeletal:     Right ankle: She exhibits swelling.     Left ankle: She exhibits swelling.  Lymphadenopathy:    She has no cervical adenopathy.  Neurological: She is alert and oriented to person, place, and time. No cranial nerve deficit.  Skin: Skin is warm. Nails show no clubbing.  Chronic lower extremity discoloration  Psychiatric: She has a normal mood and affect.      Data Reviewed: Basic Metabolic Panel: Recent Labs  Lab 01/23/19 0523 01/24/19 0524 01/25/19 0442 01/26/19 0616 01/27/19 0542 01/28/19 0523  NA 133* 135 136 134* 136 132*  K 3.3* 3.1* 3.3* 3.6 4.0 4.1  CL 91* 92* 93* 94* 96* 92*  CO2 31 33* 33* 31 32 29  GLUCOSE 132* 158* 95 127* 140* 235*  BUN 47* 55* 57* 56* 56* 58*  CREATININE 1.75* 1.83* 1.83* 1.93* 1.95* 2.20*  CALCIUM 8.4* 8.2* 8.3* 8.2* 8.3* 8.4*  MG 2.0 2.1  --   --   --  2.1   CBC: Recent Labs  Lab 01/22/19 0546 01/23/19 0523  01/25/19  1937 01/26/19 0616 01/27/19 0542 01/28/19 0105 01/28/19 0523  WBC 5.9 10.1  --   --  6.7 8.7  --  7.5  NEUTROABS  --  8.7*  --   --   --   --   --   --   HGB 7.4* 8.0*   < > 8.3* 8.1* 7.7* 8.6* 8.8*  HCT 23.6* 25.5*   < > 27.2* 26.7* 25.8* 28.0* 28.2*  MCV 84.3 83.9  --   --  86.1 87.2  --  85.2  PLT 188 208  --   --  196 191  --  184   < > = values in this interval not displayed.   BNP (last 3 results) Recent Labs    12/13/18 1136 01/17/19 0011  BNP 1,269.0* 790.0*     CBG: Recent Labs  Lab 01/27/19 1147 01/27/19 1652 01/27/19 2137 01/28/19 0753  01/28/19 1207  GLUCAP 221* 221* 335* 197* 229*    No results found for this or any previous visit (from the past 240 hour(s)).    Scheduled Meds: . sodium chloride   Intravenous Once  . atorvastatin  40 mg Oral Daily  . Chlorhexidine Gluconate Cloth  6 each Topical Q0600  . furosemide  80 mg Intravenous BID  . insulin aspart  0-5 Units Subcutaneous QHS  . insulin aspart  0-9 Units Subcutaneous TID WC  . insulin aspart  6 Units Subcutaneous TID WC  . insulin glargine  22 Units Subcutaneous QHS  . levothyroxine  50 mcg Oral QAC breakfast  . metoprolol tartrate  25 mg Oral BID  . pantoprazole  40 mg Intravenous Q12H  . potassium chloride  20 mEq Oral BID  . [START ON 01/29/2019] predniSONE  20 mg Oral Q breakfast  . sodium chloride flush  3 mL Intravenous Q12H   Continuous Infusions: . sodium chloride 0 mL (01/17/19 1010)  . albumin human 12.5 g (01/28/19 0817)  . milrinone      Assessment/Plan:  1. Acute on chronic systolic congestive heart failure.  Patient taken off milrinone and Lasix drip. Lasix dose increased to 60 mg IV from 40 mg holding losartan.  But -17 L and the last 24 hours 400 cc negative output BiPAP nightly.  Pulmonary venous congestion noticed with no pulmonary edema on repeat chest x-ray on 01/25/2019 .  On 01/28/2019 patient is still fluid overloaded and not making good urine output on Lasix.  Cardiology is recommending to transfer back to intensive care unit for milrinone drip.  Intensivist notified he is agreeable 2. Acute hypoxic respiratory failure.  Patient on 2 L of oxygen and normally she does not wear oxygen.  Wean down as tolerated 3. Cardiogenic shock.  Decrease Solu-Cortef to 50 mg daily dose, changed to p.o. prednisone.  Holding other antihypertensive medications. 4. GI bleed and melena.  Vitamin K given to reverse Coumadin. Reconsulted GI, seen by Dr. Bonna Gains, scheduled for EGD on 01/25/2019 but the procedure was canceled as patient became hypoxemic  significantly with minimal sedation.  No active bleeding ,hemoglobin 8.1 -8.0 -8.1, Coumadin resumed has GI and cardiology are agreeable to resume that patient on IV Protonix twice daily.  we will get tagged RBC scan if patient starts bleeding actively.  01/27/2019 hemoglobin trended down to 7.7, cardiology has recommended to stop Coumadin as the patient failed the Coumadin challenge and also recommending to keep her hemoglobin greater than 8.  1 unit of blood transfusion ordered with IV Lasix prior to the transfusion.  01/28/2019-repeat hemoglobin is at 8.8, will closely monitor hemoglobin hematocrit twice daily and transfuse as needed 5. Acute blood loss anemia.  Status post multiple transfusions.  GI is recommending bleeding scan if patient starts bleeding again.  GI signed off 6. Acute kidney injury on chronic kidney disease 3 secondary to cardiorenal syndrome.  Creatinine improving with diuresis.  Nephrology is following, agreeable to continue IV Lasix 7. Persistent atrial fibrillation status post cardioversion and atrial fibrillation.  Amiodarone.  Coumadin discontinued, as patient failed Coumadin challenge.  For frequent PVCs we will continue beta-blocker and cardiology notified 8. type 2 diabetes mellitus on sliding scale insulin and long-acting insulin. 9. History of CVA -discontinue Coumadin in view of GI bleed and failed Coumadin challenge.   10. Weakness physical therapy evaluation appreciated.  PT recommending home with home health. 11. Urinary tract infection treated with 5 days of Rocephin.  DC Foley catheter and voiding trial 12.  Head lice-permethrin treatment given on 01/25/2019, will repeat if needed in 72 hours 13.  Generalized weakness physical therapy consult-patient is almost bedbound needs assistance with transfers at her baseline  Code Status:     Code Status Orders  (From admission, onward)         Start     Ordered   01/17/19 0302  Full code  Continuous     01/17/19  0306        Code Status History    Date Active Date Inactive Code Status Order ID Comments User Context   12/13/2018 1415 12/15/2018 1952 Full Code [id number]  Otila Back, MD ED   03/30/2017 2002 04/01/2017 1940 DNR [id number]  Demetrios Loll, MD Inpatient   03/12/2017 1236 03/12/2017 1720 Full Code [id number]  Wellington Hampshire, MD Inpatient   09/21/2016 2056 09/24/2016 1922 Full Code [id number]  Theodoro Grist, MD Inpatient   07/26/2016 1530 07/28/2016 1812 Full Code [id number]  Theodoro Grist, MD Inpatient   Advance Care Planning Activity     Family Communication: Spoke with daughter at the bedside Disposition Plan: To be determined  Consultants:  Cardiology  Nephrology  Gastroenterology  Time spent: 36 minutes, case discussed with cardiology, nephrology and intensivist.  Nicholes Mango  [employer]

## 2019-01-28 NOTE — Progress Notes (Signed)
Initial Nutrition Assessment  DOCUMENTATION CODES:   Morbid obesity  INTERVENTION:   Double protein with lunch and dinner  MVI daily   3 gram sodium diet via Health Touch  Low sodium diet education   NUTRITION DIAGNOSIS:   Increased nutrient needs related to chronic illness(CHF, morbid obesity) as evidenced by increased estimated needs.  GOAL:   Patient will meet greater than or equal to 90% of their needs  MONITOR:   PO intake, Supplement acceptance, Weight trends, Labs, Skin, I & O's  REASON FOR ASSESSMENT:   LOS    ASSESSMENT:   68 y.o. white female with chronic systolic congestive heart failure, coronary disease, atrial fibrillation on anticoagulation, diabetes mellitus type II, anemia, carotid artery disease with history of left carotid endarterectomy, stroke, hypertension, chronic kidney disease stage III, was admitted on 01/17/2019 with shortness of breath worsening edema, acute kidney injury.   Spoke with patient via phone. Pt is a poor historian but reports good appetite and oral intake at baseline and reports eating 50-100% of meals in hospital. Pt does not like supplements and declines all ONS. RD briefly discussed basic low sodium diet education with patient focusing mainly on limiting use of the salt shaker. RD will add 3 gram sodium diet in Health Touch and will request double protein with meals to help pt meet her estimated protein needs.   Per chart, pt with weight gain pta but has lost ~10lbs since admit. Pt is still ~20-30lbs above her UBW. UOP low; 927ml x 24 hrs if documented correctly.   Medications reviewed and include: lasix, insulin, protonix, KCl, prednisone, albumin  Labs reviewed: Na 132(L), BUN 58(H), creat 2.20(H), Mg 2.1 wnl Hgb 9.2(L), Hct 29.8(L) cbgs- 197, 229 x 24 hrs AIC 7.4(H)- 9/5  Unable to complete Nutrition-Focused physical exam at this time as patient on contact precautions.   Diet Order:   Diet Order            Diet Carb  Modified Fluid consistency: Thin; Room service appropriate? Yes  Diet effective now             EDUCATION NEEDS:   Education needs have been addressed  Skin:  Skin Assessment: Reviewed RN Assessment(ecchymosis, MASD)  Last BM:  10/20- type 3  Height:   Ht Readings from Last 1 Encounters:  01/17/19 5\' 7"  (1.702 m)    Weight:   Wt Readings from Last 1 Encounters:  01/28/19 134.3 kg    Ideal Body Weight:  61.36 kg  BMI:  Body mass index is 46.37 kg/m.  Estimated Nutritional Needs:   Kcal:  2200-2500kcal/day  Protein:  110-125g/day  Fluid:  1.5-1.8L/day  Koleen Distance MS, RD, LDN Pager #- [phone] Office#- [phone] After Hours Pager: [phone]

## 2019-01-28 NOTE — Progress Notes (Signed)
Inpatient Diabetes Program Recommendations  AACE/ADA: New Consensus Statement on Inpatient Glycemic Control   Target Ranges:  Prepandial:   less than 140 mg/dL      Peak postprandial:   less than 180 mg/dL (1-2 hours)      Critically ill patients:  140 - 180 mg/dL   Results for Meghan [name], Meghan [name] (MRN [id number]) as of 01/28/2019 09:13  Ref. Range 01/27/2019 07:34 01/27/2019 11:47 01/27/2019 16:52 01/27/2019 21:37 01/28/2019 07:53  Glucose-Capillary Latest Ref Range: 70 - 99 mg/dL 131 (H) 221 (H) 221 (H) 335 (H) 197 (H)   Review of Glycemic Control  Diabetes history: DM2 Outpatient Diabetes medications: Tresiba 25 units daily, Glipizide 10 mg BID Current orders for Inpatient glycemic control: Lantus 20 units QHS, Novolog 0-9 units TID with meals, Novolog 0-5 units QHS, Novolog 3 units TID with meals for meal coverage; Solucortef 50 mg daily   Inpatient Diabetes Program Recommendations:   Insulin-Basal: If steroids are continued as ordered, please consider increasing Lantus to 22 units QHS.  Insulin-Meal Coverage: If steroids are continued, please consider increasing meal coverage to Novolog 6 units TID with meals if patient eats at least 50% of meals.  Diet: Please consider discontinuing Regular diet and ordering Carb Modified Heart healthy diet.  Thanks, Barnie Alderman, RN, MSN, CDE Diabetes Coordinator Inpatient Diabetes Program [phone] (Team Pager from 8am to 5pm)

## 2019-01-28 NOTE — Progress Notes (Signed)
SATURATION QUALIFICATIONS: (This note is used to comply with regulatory documentation for home oxygen)  Patient Saturations on Room Air at Rest = 86%  Patient Saturations on Room Air while Ambulating = 84%  Patient Saturations on 2 Liters of oxygen while Ambulating = 97%  Please briefly explain why patient needs home oxygen: Patient has shortness of breath on exertion and saturations drop with activity.

## 2019-01-28 NOTE — Consult Note (Signed)
PHARMACY CONSULT NOTE  Pharmacy Consult for Electrolyte Monitoring and Replacement   Recent Labs: Potassium (mmol/L)  Date Value  01/28/2019 4.1  07/10/2013 3.6   Magnesium (mg/dL)  Date Value  01/28/2019 2.1  07/10/2013 1.5 (L)   Calcium (mg/dL)  Date Value  01/28/2019 8.4 (L)   Calcium, Total (mg/dL)  Date Value  07/10/2013 8.1 (L)   Albumin (g/dL)  Date Value  01/17/2019 2.2 (L)  06/15/2017 3.5 (L)  07/08/2013 1.7 (L)   Phosphorus (mg/dL)  Date Value  01/21/2019 3.3   Sodium (mmol/L)  Date Value  01/28/2019 132 (L)  12/24/2018 135  07/10/2013 134 (L)   Corrected Ca: 9.74 mg/dL  Assessment: 68 y.o. female with medical problems of chronic systolic congestive heart failure, coronary disease, atrial fibrillation requiring anticoagulation, diabetes, anemia, carotid artery disease with history of left carotid endarterectomy, previous stroke, hypertension, chronic kidney disease, was admitted on10/9/2020with shortness of breath worsening edema, acute kidney injury. She was on furosemide 60 mg IV BID which was d/c yesterday and changed to torsemide 40 mg daily and she remains on oral KCl 20 mEq BID.  Torsemide 40mg  daily Dcd 10/19. Furosemide 80mg  BID ordered   Scr:  1.93>> 1.95 >> 2.20   Goal of Therapy:  Given cardiac history: Potassium 4.0 - 5.1 mmol/L Magnesium: 2.0 - 2.4 mg/dL Other electrolytes WNL  Plan:   Continue potassium 20 mEq PO BID  No additional electrolyte supplementation warranted at this time  F/U electrolytes, including magnesium, with am labs.  Pernell Dupre, PharmD, BCPS Clinical Pharmacist 01/28/2019 8:04 AM

## 2019-01-28 NOTE — Progress Notes (Signed)
[facility]  ROUNDING NOTE   Subjective:   UOP 931mL  Creatinine 2.2 (1.95)  PRBC transfusion yesterday.   Objective:  Vital signs in last 24 hours:  Temp:  [97.4 F (36.3 C)-97.7 F (36.5 C)] 97.7 F (36.5 C) (10/20 0420) Pulse Rate:  [84-113] 84 (10/20 0420) Resp:  [18-20] 19 (10/20 0420) BP: (94-128)/(73-96) 123/76 (10/20 0420) SpO2:  [93 %-99 %] 99 % (10/20 0420) Weight:  [134.3 kg] 134.3 kg (10/20 0420)  Weight change: 1.2 kg Filed Weights   01/26/19 0423 01/27/19 0511 01/28/19 0420  Weight: 132.7 kg 133.1 kg 134.3 kg    Intake/Output: I/O last 3 completed shifts: In: 568.8 [P.O.:100; I.V.:3; Blood:390; IV Piggyback:75.8] Out: 1101 [Urine:1101]   Intake/Output this shift:  Total I/O In: 21.5 [I.V.:21.5] Out: -   Physical Exam: General: NAD,   Head: Normocephalic, atraumatic. Moist oral mucosal membranes  Eyes: Anicteric, PERRL  Neck: Supple, trachea midline  Lungs:  Clear to auscultation  Heart: Regular rate and rhythm  Abdomen:  Soft, nontender,   Extremities:  + peripheral edema.  Neurologic: Nonfocal, moving all four extremities  Skin: No lesions        Basic Metabolic Panel: Recent Labs  Lab 01/23/19 0523 01/24/19 0524 01/25/19 0442 01/26/19 0616 01/27/19 0542 01/28/19 0523  NA 133* 135 136 134* 136 132*  K 3.3* 3.1* 3.3* 3.6 4.0 4.1  CL 91* 92* 93* 94* 96* 92*  CO2 31 33* 33* 31 32 29  GLUCOSE 132* 158* 95 127* 140* 235*  BUN 47* 55* 57* 56* 56* 58*  CREATININE 1.75* 1.83* 1.83* 1.93* 1.95* 2.20*  CALCIUM 8.4* 8.2* 8.3* 8.2* 8.3* 8.4*  MG 2.0 2.1  --   --   --  2.1    Liver Function Tests: No results for input(s): AST, ALT, ALKPHOS, BILITOT, PROT, ALBUMIN in the last 168 hours. No results for input(s): LIPASE, AMYLASE in the last 168 hours. No results for input(s): AMMONIA in the last 168 hours.  CBC: Recent Labs  Lab 01/22/19 0546 01/23/19 0523  01/25/19 1937 01/26/19 0616 01/27/19 0542 01/28/19 0105  01/28/19 0523  WBC 5.9 10.1  --   --  6.7 8.7  --  7.5  NEUTROABS  --  8.7*  --   --   --   --   --   --   HGB 7.4* 8.0*   < > 8.3* 8.1* 7.7* 8.6* 8.8*  HCT 23.6* 25.5*   < > 27.2* 26.7* 25.8* 28.0* 28.2*  MCV 84.3 83.9  --   --  86.1 87.2  --  85.2  PLT 188 208  --   --  196 191  --  184   < > = values in this interval not displayed.    Cardiac Enzymes: No results for input(s): CKTOTAL, CKMB, CKMBINDEX, TROPONINI in the last 168 hours.  BNP: Invalid input(s): POCBNP  CBG: Recent Labs  Lab 01/27/19 0734 01/27/19 1147 01/27/19 1652 01/27/19 2137 01/28/19 0753  GLUCAP 131* 221* 221* 335* 197*    Microbiology: Results for orders placed or performed during the hospital encounter of 01/17/19  SARS Coronavirus 2 by RT PCR (hospital order, performed in [facility] hospital lab) Nasopharyngeal Nasopharyngeal Swab     Status: None   Collection Time: 01/17/19  1:41 AM   Specimen: Nasopharyngeal Swab  Result Value Ref Range Status   SARS Coronavirus 2 NEGATIVE NEGATIVE Final    Comment: (NOTE) If result is NEGATIVE SARS-CoV-2 target nucleic acids are  NOT DETECTED. The SARS-CoV-2 RNA is generally detectable in upper and lower  respiratory specimens during the acute phase of infection. The lowest  concentration of SARS-CoV-2 viral copies this assay can detect is 250  copies / mL. A negative result does not preclude SARS-CoV-2 infection  and should not be used as the sole basis for treatment or other  patient management decisions.  A negative result may occur with  improper specimen collection / handling, submission of specimen other  than nasopharyngeal swab, presence of viral mutation(s) within the  areas targeted by this assay, and inadequate number of viral copies  (<250 copies / mL). A negative result must be combined with clinical  observations, patient history, and epidemiological information. If result is POSITIVE SARS-CoV-2 target nucleic acids are DETECTED. The  SARS-CoV-2 RNA is generally detectable in upper and lower  respiratory specimens dur ing the acute phase of infection.  Positive  results are indicative of active infection with SARS-CoV-2.  Clinical  correlation with patient history and other diagnostic information is  necessary to determine patient infection status.  Positive results do  not rule out bacterial infection or co-infection with other viruses. If result is PRESUMPTIVE POSTIVE SARS-CoV-2 nucleic acids MAY BE PRESENT.   A presumptive positive result was obtained on the submitted specimen  and confirmed on repeat testing.  While 2019 novel coronavirus  (SARS-CoV-2) nucleic acids may be present in the submitted sample  additional confirmatory testing may be necessary for epidemiological  and / or clinical management purposes  to differentiate between  SARS-CoV-2 and other Sarbecovirus currently known to infect humans.  If clinically indicated additional testing with an alternate test  methodology [phone]) is advised. The SARS-CoV-2 RNA is generally  detectable in upper and lower respiratory sp ecimens during the acute  phase of infection. The expected result is Negative. Fact Sheet for Patients:  StrictlyIdeas.no Fact Sheet for Healthcare Providers: BankingDealers.co.za This test is not yet approved or cleared by the [place] FDA and has been authorized for detection and/or diagnosis of SARS-CoV-2 by FDA under an Emergency Use Authorization (EUA).  This EUA will remain in effect (meaning this test can be used) for the duration of the COVID-19 declaration under Section 564(b)(1) of the Act, 21 U.S.C. section 360bbb-3(b)(1), unless the authorization is terminated or revoked sooner. Performed at [facility], [address] [place], [place] [address]   MRSA PCR Screening     Status: None   Collection Time: 01/17/19  4:44 AM   Specimen: Nasal Mucosa;  Nasopharyngeal  Result Value Ref Range Status   MRSA by PCR NEGATIVE NEGATIVE Final    Comment:        The GeneXpert MRSA Assay (FDA approved for NASAL specimens only), is one component of a comprehensive MRSA colonization surveillance program. It is not intended to diagnose MRSA infection nor to guide or monitor treatment for MRSA infections. Performed at [facility], [address], [place], [place] [address]   Urine Culture     Status: Abnormal   Collection Time: 01/17/19 11:52 AM   Specimen: Urine, Clean Catch  Result Value Ref Range Status   Specimen Description   Final    URINE, CLEAN CATCH Performed at [facility], [address], [place], [place] [address]    Special Requests   Final    Normal Performed at [facility], [place], [place] [address]    Culture >=100,000 COLONIES/mL ESCHERICHIA COLI (A)  Final  Report Status 01/19/2019 FINAL  Final   Organism ID, Bacteria ESCHERICHIA COLI (A)  Final      Susceptibility   Escherichia coli - MIC*    AMPICILLIN >=32 RESISTANT Resistant     CEFAZOLIN <=4 SENSITIVE Sensitive     CEFTRIAXONE <=1 SENSITIVE Sensitive     CIPROFLOXACIN >=4 RESISTANT Resistant     GENTAMICIN <=1 SENSITIVE Sensitive     IMIPENEM <=0.25 SENSITIVE Sensitive     NITROFURANTOIN <=16 SENSITIVE Sensitive     TRIMETH/SULFA <=20 SENSITIVE Sensitive     AMPICILLIN/SULBACTAM 8 SENSITIVE Sensitive     PIP/TAZO <=4 SENSITIVE Sensitive     Extended ESBL NEGATIVE Sensitive     * >=100,000 COLONIES/mL ESCHERICHIA COLI    Coagulation Studies: Recent Labs    01/26/19 0616 01/27/19 0542 01/28/19 0523  LABPROT 15.2 15.2 15.7*  INR 1.2 1.2 1.3*    Urinalysis: No results for input(s): COLORURINE, LABSPEC, PHURINE, GLUCOSEU, HGBUR, BILIRUBINUR, KETONESUR, PROTEINUR, UROBILINOGEN, NITRITE, LEUKOCYTESUR in the last 72 hours.  Invalid input(s): APPERANCEUR    Imaging: No results found.   Medications:    . sodium chloride 0 mL (01/17/19 1010)  . albumin human 12.5 g (01/28/19 0817)   . sodium chloride   Intravenous Once  . atorvastatin  40 mg Oral Daily  . Chlorhexidine Gluconate Cloth  6 each Topical Q0600  . furosemide  80 mg Intravenous BID  . insulin aspart  0-5 Units Subcutaneous QHS  . insulin aspart  0-9 Units Subcutaneous TID WC  . insulin aspart  5 Units Subcutaneous TID WC  . insulin glargine  20 Units Subcutaneous QHS  . levothyroxine  50 mcg Oral QAC breakfast  . metoprolol tartrate  25 mg Oral BID  . pantoprazole  40 mg Intravenous Q12H  . potassium chloride  20 mEq Oral BID  . [START ON 01/29/2019] predniSONE  20 mg Oral Q breakfast  . sodium chloride flush  3 mL Intravenous Q12H   sodium chloride, acetaminophen, HYDROcodone-acetaminophen, ondansetron (ZOFRAN) IV, polyethylene glycol, sodium chloride flush  Assessment/ Plan:  Ms. Kanecia Chick is a 68 y.o. white female with chronic systolic congestive heart failure, coronary disease, atrial fibrillation on anticoagulation, diabetes mellitus type II, anemia, carotid artery disease with history of left carotid endarterectomy, stroke, hypertension, chronic kidney disease stage III, was admitted on10/9/2020with shortness of breath worsening edema, acute kidney injury.  1. Acute kidney injury on Chronic kidney disease stage III with proteinuria and hematuria Baseline creatinine of 1.15, GFR of 49 on 06/10/18.  Chronic kidney disease secondary to diabetic nephropathy Acute renal failure secondary to acute cardiorenal syndrome   No indication for dialysis  - Continue furosemide 80mg  IV q12  2. Acute exacerbation of systolic congestive heart failure.  IV furosemide as above.   3. Hypotension: Changed carvedilol to metoprolol this admission.  Holding losartan due to renal failure.   4. Urinary tract infection: E. Coli. Completed course of antibiotics.   5. Hypokalemia  - potassium replacement  6. GI bleed with acute  blood loss anemia and acute renal failure - holding warfarin. PRBC transfusion on 10/19    LOS: 11 Erastus Bartolomei 10/20/202011:56 AM

## 2019-01-29 ENCOUNTER — Ambulatory Visit: Payer: Medicare Other | Admitting: Family

## 2019-01-29 DIAGNOSIS — I5023 Acute on chronic systolic (congestive) heart failure: Secondary | ICD-10-CM | POA: Diagnosis not present

## 2019-01-29 LAB — HEMOGLOBIN AND HEMATOCRIT, BLOOD
HCT: 26.6 % — ABNORMAL LOW (ref 36.0–46.0)
HCT: 26.2 % — ABNORMAL LOW (ref 36.0–46.0)
Hemoglobin: 8.3 g/dL — ABNORMAL LOW (ref 12.0–15.0)
Hemoglobin: 8.2 g/dL — ABNORMAL LOW (ref 12.0–15.0)

## 2019-01-29 LAB — BASIC METABOLIC PANEL
Anion gap: 11 (ref 5–15)
BUN: 58 mg/dL — ABNORMAL HIGH (ref 8–23)
CO2: 30 mmol/L (ref 22–32)
Calcium: 8.4 mg/dL — ABNORMAL LOW (ref 8.9–10.3)
Chloride: 94 mmol/L — ABNORMAL LOW (ref 98–111)
Creatinine, Ser: 2.1 mg/dL — ABNORMAL HIGH (ref 0.44–1.00)
GFR calc Af Amer: 27 mL/min — ABNORMAL LOW (ref >60)
GFR calc non Af Amer: 23 mL/min — ABNORMAL LOW (ref >60)
Glucose, Bld: 231 mg/dL — ABNORMAL HIGH (ref 70–99)
Potassium: 3.9 mmol/L (ref 3.5–5.1)
Sodium: 135 mmol/L (ref 135–145)

## 2019-01-29 LAB — CBC
HCT: 25.8 % — ABNORMAL LOW (ref 36.0–46.0)
Hemoglobin: 8.1 g/dL — ABNORMAL LOW (ref 12.0–15.0)
MCH: 26.5 pg (ref 26.0–34.0)
MCHC: 31.4 g/dL (ref 30.0–36.0)
MCV: 84.3 fL (ref 80.0–100.0)
Platelets: 192 10*3/uL (ref 150–400)
RBC: 3.06 MIL/uL — ABNORMAL LOW (ref 3.87–5.11)
RDW: 19.2 % — ABNORMAL HIGH (ref 11.5–15.5)
WBC: 6.8 10*3/uL (ref 4.0–10.5)
nRBC: 0 % (ref 0.0–0.2)

## 2019-01-29 LAB — PROTIME-INR
INR: 1.3 — ABNORMAL HIGH (ref 0.8–1.2)
Prothrombin Time: 16.2 seconds — ABNORMAL HIGH (ref 11.4–15.2)

## 2019-01-29 LAB — MAGNESIUM: Magnesium: 2 mg/dL (ref 1.7–2.4)

## 2019-01-29 LAB — GLUCOSE, CAPILLARY
Glucose-Capillary: 188 mg/dL — ABNORMAL HIGH (ref 70–99)
Glucose-Capillary: 189 mg/dL — ABNORMAL HIGH (ref 70–99)
Glucose-Capillary: 189 mg/dL — ABNORMAL HIGH (ref 70–99)
Glucose-Capillary: 252 mg/dL — ABNORMAL HIGH (ref 70–99)

## 2019-01-29 NOTE — Progress Notes (Signed)
Patient ID: Meghan [name], female   DOB: [birth year], 68 y.o.   MRN: [id number]  [employer] PROGRESS NOTE  Meghan [name] [id number] DOB: October 17, 1950 DOA: 01/17/2019 PCP: Meghan Patch, MD  HPI/Subjective: Patient with good urine output, feels tired Patients EGD canceled on 01/25/19  as patient was hypoxemic with minimal sedation.  Objective: Vitals:   01/29/19 1100 01/29/19 1200  BP: (!) 90/51 100/75  Pulse: 96 90  Resp: (!) 22 19  Temp:  98.7 F (37.1 C)  SpO2: 95% 94%    Intake/Output Summary (Last 24 hours) at 01/29/2019 1544 Last data filed at 01/29/2019 1200 Gross per 24 hour  Intake 338.5 ml  Output 1550 ml  Net -1211.5 ml   Filed Weights   01/27/19 0511 01/28/19 0420 01/29/19 0500  Weight: 133.1 kg 134.3 kg 129.4 kg    ROS: Review of Systems  Constitutional: Negative for chills and fever.  Eyes: Negative for blurred vision.  Respiratory: Negative for cough and shortness of breath.   Cardiovascular: Negative for chest pain.  Gastrointestinal: Negative for abdominal pain, constipation, diarrhea, melena, nausea and vomiting.  Genitourinary: Negative for dysuria.  Musculoskeletal: Negative for joint pain.  Neurological: Negative for dizziness and headaches.   Exam: Physical Exam  Constitutional: She is oriented to person, place, and time.  HENT:  Nose: No mucosal edema.  Mouth/Throat: No oropharyngeal exudate or posterior oropharyngeal edema.  Eyes: Pupils are equal, round, and reactive to light. Conjunctivae, EOM and lids are normal.  Neck: No JVD present. Carotid bruit is not present. No edema present. No thyroid mass and no thyromegaly present.  Cardiovascular: S1 normal and S2 normal. Exam reveals no gallop.  No murmur heard. Pulses:      Dorsalis pedis pulses are 2+ on the right side and 2+ on the left side.  Respiratory: No respiratory distress. She has decreased breath sounds in the right lower field and the left lower field. She has no wheezes. She  has no rhonchi. She has rales in the right lower field and the left lower field.  GI: Soft. Bowel sounds are normal. There is no abdominal tenderness.  Anasarca  Musculoskeletal:     Right ankle: She exhibits swelling.     Left ankle: She exhibits swelling.  Lymphadenopathy:    She has no cervical adenopathy.  Neurological: She is alert and oriented to person, place, and time. No cranial nerve deficit.  Skin: Skin is warm. Nails show no clubbing.  Chronic lower extremity discoloration  Psychiatric: She has a normal mood and affect.      Data Reviewed: Basic Metabolic Panel: Recent Labs  Lab 01/23/19 0523 01/24/19 0524 01/25/19 0442 01/26/19 0616 01/27/19 0542 01/28/19 0523 01/29/19 0453  NA 133* 135 136 134* 136 132* 135  K 3.3* 3.1* 3.3* 3.6 4.0 4.1 3.9  CL 91* 92* 93* 94* 96* 92* 94*  CO2 31 33* 33* 31 32 29 30  GLUCOSE 132* 158* 95 127* 140* 235* 231*  BUN 47* 55* 57* 56* 56* 58* 58*  CREATININE 1.75* 1.83* 1.83* 1.93* 1.95* 2.20* 2.10*  CALCIUM 8.4* 8.2* 8.3* 8.2* 8.3* 8.4* 8.4*  MG 2.0 2.1  --   --   --  2.1 2.0   CBC: Recent Labs  Lab 01/23/19 0523  01/26/19 0616 01/27/19 0542  01/28/19 0523 01/28/19 1423 01/29/19 0211 01/29/19 0453 01/29/19 1352  WBC 10.1  --  6.7 8.7  --  7.5  --   --  6.8  --   NEUTROABS  8.7*  --   --   --   --   --   --   --   --   --   HGB 8.0*   < > 8.1* 7.7*   < > 8.8* 9.2* 8.3* 8.1* 8.2*  HCT 25.5*   < > 26.7* 25.8*   < > 28.2* 29.8* 26.6* 25.8* 26.2*  MCV 83.9  --  86.1 87.2  --  85.2  --   --  84.3  --   PLT 208  --  196 191  --  184  --   --  192  --    < > = values in this interval not displayed.   BNP (last 3 results) Recent Labs    12/13/18 1136 01/17/19 0011  BNP 1,269.0* 790.0*     CBG: Recent Labs  Lab 01/28/19 1646 01/28/19 1747 01/28/19 2259 01/29/19 0723 01/29/19 1131  GLUCAP 313* 436* 312* 188* 189*    No results found for this or any previous visit (from the past 240 hour(s)).    Scheduled  Meds: . sodium chloride   Intravenous Once  . atorvastatin  40 mg Oral Daily  . Chlorhexidine Gluconate Cloth  6 each Topical Q0600  . furosemide  80 mg Intravenous BID  . insulin aspart  0-5 Units Subcutaneous QHS  . insulin aspart  0-9 Units Subcutaneous TID WC  . insulin aspart  6 Units Subcutaneous TID WC  . insulin glargine  22 Units Subcutaneous QHS  . levothyroxine  50 mcg Oral QAC breakfast  . metoprolol tartrate  25 mg Oral BID  . multivitamin with minerals  1 tablet Oral Daily  . pantoprazole  40 mg Intravenous Q12H  . potassium chloride  20 mEq Oral BID  . predniSONE  20 mg Oral Q breakfast  . sodium chloride flush  3 mL Intravenous Q12H   Continuous Infusions: . sodium chloride 0 mL (01/17/19 1010)  . albumin human Stopped (01/29/19 1112)  . milrinone 0.25 mcg/kg/min (01/29/19 1230)    Assessment/Plan:  1. Acute on chronic systolic congestive heart failure.  Patient taken off milrinone and Lasix drip. Lasix dose increased to 60 mg IV from 40 mg holding losartan.  But -17 L and the last 24 hours 400 cc negative output BiPAP nightly.  Pulmonary venous congestion noticed with no pulmonary edema on repeat chest x-ray on 01/25/2019 .  On 01/28/2019 patient is still fluid overloaded and not making good urine output on Lasix.  Cardiology is recommending to transfer back to intensive care unit for milrinone drip.  Intensivist notified he is agreeable.  01/29/2019; -588 2 mL the past 2 4 hours, not quite sure whether it is accurate or not .  Plan is to continue milrinone drip with Lasix 80 IV every 12 2. Acute hypoxic respiratory failure.  Patient on 2 L of oxygen and normally she does not wear oxygen.  Wean down as tolerated 3. Cardiogenic shock.  Decrease Solu-Cortef to 50 mg daily dose, changed to p.o. prednisone.  Holding other antihypertensive medications. 4. GI bleed and melena.  Vitamin K given to reverse Coumadin. Reconsulted GI, seen by Dr. Bonna Gains, scheduled for EGD on  01/25/2019 but the procedure was canceled as patient became hypoxemic significantly with minimal sedation.  No active bleeding ,hemoglobin 8.1 -8.0 -8.1, Coumadin resumed has GI and cardiology are agreeable to resume that patient on IV Protonix twice daily.  we will get tagged RBC scan if patient starts bleeding actively.  01/27/2019  hemoglobin trended down to 7.7, cardiology has recommended to stop Coumadin as the patient failed the Coumadin challenge and also recommending to keep her hemoglobin greater than 8.  1 unit of blood transfusion ordered with IV Lasix prior to the transfusion.  01/28/2019-repeat hemoglobin is at 8.8, will closely monitor hemoglobin hematocrit twice daily and transfuse as needed.  01/29/2019 hemoglobin is at 8.1 5. Acute blood loss anemia.  Status post multiple transfusions.  GI is recommending bleeding scan if patient starts bleeding again.  GI signed off 6. Acute kidney injury on chronic kidney disease 3 secondary to cardiorenal syndrome.  Creatinine improving with diuresis.  Nephrology is following, agreeable to continue IV Lasix and milrinone drip 7. Persistent atrial fibrillation status post cardioversion and atrial fibrillation.  Amiodarone.  Coumadin discontinued, as patient failed Coumadin challenge.  For frequent PVCs we will continue beta-blocker and cardiology notified 8. type 2 diabetes mellitus on sliding scale insulin and long-acting insulin. 9. History of CVA -discontinue Coumadin in view of GI bleed and failed Coumadin challenge.   10. Weakness physical therapy evaluation appreciated.  PT recommending home with home health. 11. Urinary tract infection treated with 5 days of Rocephin.  12.  Head lice-permethrin treatment given on 01/25/2019, reassessment no lice infestation noticed by the RN  13.  Generalized weakness physical therapy consult-patient is almost bedbound needs assistance with transfers at her baseline  Code Status:     Code Status Orders  (From  admission, onward)         Start     Ordered   01/17/19 0302  Full code  Continuous     01/17/19 0306        Code Status History    Date Active Date Inactive Code Status Order ID Comments User Context   12/13/2018 1415 12/15/2018 1952 Full Code [id number]  Otila Back, MD ED   03/30/2017 2002 04/01/2017 1940 DNR [id number]  Demetrios Loll, MD Inpatient   03/12/2017 1236 03/12/2017 1720 Full Code [id number]  Wellington Hampshire, MD Inpatient   09/21/2016 2056 09/24/2016 1922 Full Code [id number]  Theodoro Grist, MD Inpatient   07/26/2016 1530 07/28/2016 1812 Full Code [id number]  Theodoro Grist, MD Inpatient   Advance Care Planning Activity     Family Communication: Spoke with daughter at the bedside Disposition Plan: To be determined  Consultants:  Cardiology  Nephrology  Gastroenterology  Time spent: 36 minutes, case discussed with cardiology, nephrology and intensivist.  Nicholes Mango  [employer]

## 2019-01-29 NOTE — Progress Notes (Signed)
[facility]  ROUNDING NOTE   Subjective:   Transferred to ICU. Placed on milrinone gtt.   UOP 1961mL  Objective:  Vital signs in last 24 hours:  Temp:  [97.4 F (36.3 C)-98.7 F (37.1 C)] 98.7 F (37.1 C) (10/21 0200) Pulse Rate:  [39-110] 80 (10/21 0600) Resp:  [15-29] 15 (10/21 0600) BP: (107-151)/(63-105) 133/65 (10/21 0600) SpO2:  [96 %-99 %] 98 % (10/21 0500) Weight:  [129.4 kg] 129.4 kg (10/21 0500)  Weight change: -4.9 kg Filed Weights   01/27/19 0511 01/28/19 0420 01/29/19 0500  Weight: 133.1 kg 134.3 kg 129.4 kg    Intake/Output: I/O last 3 completed shifts: In: 749.9 [P.O.:100; I.V.:142.8; Blood:390; IV Piggyback:117.1] Out: 2850 [Urine:2850]   Intake/Output this shift:  No intake/output data recorded.  Physical Exam: General: NAD,   Head: Normocephalic, atraumatic. Moist oral mucosal membranes  Eyes: Anicteric, PERRL  Neck: Supple, trachea midline  Lungs:  Clear to auscultation, 2 L [place] O2  Heart: Regular rate and rhythm  Abdomen:  Soft, nontender,   Extremities:  + peripheral edema.  Neurologic: Nonfocal, moving all four extremities  Skin: No lesions        Basic Metabolic Panel: Recent Labs  Lab 01/23/19 0523 01/24/19 0524 01/25/19 0442 01/26/19 0616 01/27/19 0542 01/28/19 0523 01/29/19 0453  NA 133* 135 136 134* 136 132* 135  K 3.3* 3.1* 3.3* 3.6 4.0 4.1 3.9  CL 91* 92* 93* 94* 96* 92* 94*  CO2 31 33* 33* 31 32 29 30  GLUCOSE 132* 158* 95 127* 140* 235* 231*  BUN 47* 55* 57* 56* 56* 58* 58*  CREATININE 1.75* 1.83* 1.83* 1.93* 1.95* 2.20* 2.10*  CALCIUM 8.4* 8.2* 8.3* 8.2* 8.3* 8.4* 8.4*  MG 2.0 2.1  --   --   --  2.1 2.0    Liver Function Tests: No results for input(s): AST, ALT, ALKPHOS, BILITOT, PROT, ALBUMIN in the last 168 hours. No results for input(s): LIPASE, AMYLASE in the last 168 hours. No results for input(s): AMMONIA in the last 168 hours.  CBC: Recent Labs  Lab 01/23/19 0523  01/26/19 0616  01/27/19 0542 01/28/19 0105 01/28/19 0523 01/28/19 1423 01/29/19 0211 01/29/19 0453  WBC 10.1  --  6.7 8.7  --  7.5  --   --  6.8  NEUTROABS 8.7*  --   --   --   --   --   --   --   --   HGB 8.0*   < > 8.1* 7.7* 8.6* 8.8* 9.2* 8.3* 8.1*  HCT 25.5*   < > 26.7* 25.8* 28.0* 28.2* 29.8* 26.6* 25.8*  MCV 83.9  --  86.1 87.2  --  85.2  --   --  84.3  PLT 208  --  196 191  --  184  --   --  192   < > = values in this interval not displayed.    Cardiac Enzymes: No results for input(s): CKTOTAL, CKMB, CKMBINDEX, TROPONINI in the last 168 hours.  BNP: Invalid input(s): POCBNP  CBG: Recent Labs  Lab 01/28/19 1207 01/28/19 1646 01/28/19 1747 01/28/19 2259 01/29/19 0723  GLUCAP 229* 313* 436* 312* 188*    Microbiology: Results for orders placed or performed during the hospital encounter of 01/17/19  SARS Coronavirus 2 by RT PCR (hospital order, performed in [facility] hospital lab) Nasopharyngeal Nasopharyngeal Swab     Status: None   Collection Time: 01/17/19  1:41 AM   Specimen: Nasopharyngeal Swab  Result Value Ref Range Status   SARS Coronavirus 2 NEGATIVE NEGATIVE Final    Comment: (NOTE) If result is NEGATIVE SARS-CoV-2 target nucleic acids are NOT DETECTED. The SARS-CoV-2 RNA is generally detectable in upper and lower  respiratory specimens during the acute phase of infection. The lowest  concentration of SARS-CoV-2 viral copies this assay can detect is 250  copies / mL. A negative result does not preclude SARS-CoV-2 infection  and should not be used as the sole basis for treatment or other  patient management decisions.  A negative result may occur with  improper specimen collection / handling, submission of specimen other  than nasopharyngeal swab, presence of viral mutation(s) within the  areas targeted by this assay, and inadequate number of viral copies  (<250 copies / mL). A negative result must be combined with clinical  observations, patient history, and  epidemiological information. If result is POSITIVE SARS-CoV-2 target nucleic acids are DETECTED. The SARS-CoV-2 RNA is generally detectable in upper and lower  respiratory specimens dur ing the acute phase of infection.  Positive  results are indicative of active infection with SARS-CoV-2.  Clinical  correlation with patient history and other diagnostic information is  necessary to determine patient infection status.  Positive results do  not rule out bacterial infection or co-infection with other viruses. If result is PRESUMPTIVE POSTIVE SARS-CoV-2 nucleic acids MAY BE PRESENT.   A presumptive positive result was obtained on the submitted specimen  and confirmed on repeat testing.  While 2019 novel coronavirus  (SARS-CoV-2) nucleic acids may be present in the submitted sample  additional confirmatory testing may be necessary for epidemiological  and / or clinical management purposes  to differentiate between  SARS-CoV-2 and other Sarbecovirus currently known to infect humans.  If clinically indicated additional testing with an alternate test  methodology [id number]) is advised. The SARS-CoV-2 RNA is generally  detectable in upper and lower respiratory sp ecimens during the acute  phase of infection. The expected result is Negative. Fact Sheet for Patients:  StrictlyIdeas.no Fact Sheet for Healthcare Providers: BankingDealers.co.za This test is not yet approved or cleared by the [place] FDA and has been authorized for detection and/or diagnosis of SARS-CoV-2 by FDA under an Emergency Use Authorization (EUA).  This EUA will remain in effect (meaning this test can be used) for the duration of the COVID-19 declaration under Section 564(b)(1) of the Act, 21 U.S.C. section 360bbb-3(b)(1), unless the authorization is terminated or revoked sooner. Performed at [facility], [address] [place], [place] [address]   MRSA PCR  Screening     Status: None   Collection Time: 01/17/19  4:44 AM   Specimen: Nasal Mucosa; Nasopharyngeal  Result Value Ref Range Status   MRSA by PCR NEGATIVE NEGATIVE Final    Comment:        The GeneXpert MRSA Assay (FDA approved for NASAL specimens only), is one component of a comprehensive MRSA colonization surveillance program. It is not intended to diagnose MRSA infection nor to guide or monitor treatment for MRSA infections. Performed at [facility], [address] [place], [place] [address]   Urine Culture     Status: Abnormal   Collection Time: 01/17/19 11:52 AM   Specimen: Urine, Clean Catch  Result Value Ref Range Status   Specimen Description   Final    URINE, CLEAN CATCH Performed at [facility], [address] [place], [place] [address]    Special Requests   Final  Normal Performed at [facility], [address] [place], [place] [address]    Culture >=100,000 COLONIES/mL ESCHERICHIA COLI (A)  Final   Report Status 01/19/2019 FINAL  Final   Organism ID, Bacteria ESCHERICHIA COLI (A)  Final      Susceptibility   Escherichia coli - MIC*    AMPICILLIN >=32 RESISTANT Resistant     CEFAZOLIN <=4 SENSITIVE Sensitive     CEFTRIAXONE <=1 SENSITIVE Sensitive     CIPROFLOXACIN >=4 RESISTANT Resistant     GENTAMICIN <=1 SENSITIVE Sensitive     IMIPENEM <=0.25 SENSITIVE Sensitive     NITROFURANTOIN <=16 SENSITIVE Sensitive     TRIMETH/SULFA <=20 SENSITIVE Sensitive     AMPICILLIN/SULBACTAM 8 SENSITIVE Sensitive     PIP/TAZO <=4 SENSITIVE Sensitive     Extended ESBL NEGATIVE Sensitive     * >=100,000 COLONIES/mL ESCHERICHIA COLI    Coagulation Studies: Recent Labs    01/27/19 0542 01/28/19 0523 01/29/19 0453  LABPROT 15.2 15.7* 16.2*  INR 1.2 1.3* 1.3*    Urinalysis: No results for input(s): COLORURINE, LABSPEC, PHURINE, GLUCOSEU, HGBUR, BILIRUBINUR, KETONESUR, PROTEINUR, UROBILINOGEN, NITRITE, LEUKOCYTESUR in the last  72 hours.  Invalid input(s): APPERANCEUR    Imaging: [place] Ekg Site Rite  Result Date: 01/28/2019 If Site Rite image not attached, placement could not be confirmed due to current cardiac rhythm.    Medications:   . sodium chloride 0 mL (01/17/19 1010)  . albumin human Stopped (01/28/19 0909)  . milrinone 0.25 mcg/kg/min (01/29/19 0600)   . sodium chloride   Intravenous Once  . atorvastatin  40 mg Oral Daily  . Chlorhexidine Gluconate Cloth  6 each Topical Q0600  . furosemide  80 mg Intravenous BID  . insulin aspart  0-5 Units Subcutaneous QHS  . insulin aspart  0-9 Units Subcutaneous TID WC  . insulin aspart  6 Units Subcutaneous TID WC  . insulin glargine  22 Units Subcutaneous QHS  . levothyroxine  50 mcg Oral QAC breakfast  . metoprolol tartrate  25 mg Oral BID  . multivitamin with minerals  1 tablet Oral Daily  . pantoprazole  40 mg Intravenous Q12H  . potassium chloride  20 mEq Oral BID  . predniSONE  20 mg Oral Q breakfast  . sodium chloride flush  3 mL Intravenous Q12H   sodium chloride, acetaminophen, HYDROcodone-acetaminophen, ondansetron (ZOFRAN) IV, polyethylene glycol, sodium chloride flush  Assessment/ Plan:  Ms. Meghan [name] is a 68 y.o. white female with chronic systolic congestive heart failure, coronary disease, atrial fibrillation on anticoagulation, diabetes mellitus type II, anemia, carotid artery disease with history of left carotid endarterectomy, stroke, hypertension, chronic kidney disease stage III, was admitted on10/9/2020with shortness of breath worsening edema, acute kidney injury.  1. Acute kidney injury on Chronic kidney disease stage III with proteinuria and hematuria Baseline creatinine of 1.15, GFR of 49 on 06/10/18.  Chronic kidney disease secondary to diabetic nephropathy Acute renal failure secondary to acute cardiorenal syndrome   No indication for dialysis  - Continue furosemide 80mg  IV q12  2. Acute exacerbation of systolic  congestive heart failure.  IV furosemide as above.  - Milirone as per cardiology  3. Hypotension: Changed carvedilol to metoprolol this admission.  Holding losartan due to renal failure.   4. Urinary tract infection: E. Coli. Completed course of antibiotics.   5. Hypokalemia  - potassium replaced.   6. GI bleed with acute blood loss anemia and acute renal failure - holding warfarin. PRBC transfusion on 10/19 Hemoglobin  stable at 8.1    LOS: 12 Meghan [name] 10/21/20208:28 AM

## 2019-01-29 NOTE — Consult Note (Signed)
PHARMACY CONSULT NOTE  Pharmacy Consult for Electrolyte Monitoring and Replacement   Recent Labs: Potassium (mmol/L)  Date Value  01/29/2019 3.9  07/10/2013 3.6   Magnesium (mg/dL)  Date Value  01/29/2019 2.0  07/10/2013 1.5 (L)   Calcium (mg/dL)  Date Value  01/29/2019 8.4 (L)   Calcium, Total (mg/dL)  Date Value  07/10/2013 8.1 (L)   Albumin (g/dL)  Date Value  01/17/2019 2.2 (L)  06/15/2017 3.5 (L)  07/08/2013 1.7 (L)   Phosphorus (mg/dL)  Date Value  01/21/2019 3.3   Sodium (mmol/L)  Date Value  01/29/2019 135  12/24/2018 135  07/10/2013 134 (L)   Corrected Ca: 9.74 mg/dL  Assessment: 68 y.o. female with medical problems of chronic systolic congestive heart failure, coronary disease, atrial fibrillation requiring anticoagulation, diabetes, anemia, carotid artery disease with history of left carotid endarterectomy, previous stroke, hypertension, chronic kidney disease, was admitted on10/9/2020with shortness of breath worsening edema, acute kidney injury. She was on furosemide 60 mg IV BID which was d/c on 10/19 and changed to torsemide 40 mg daily and she remains on oral KCl 20 mEq BID.  Torsemide 40mg  daily Dcd 10/19. Furosemide 80mg  BID ordered   Scr:  1.93>> 1.95 >> 2.20 >>2.1  Goal of Therapy:  Given cardiac history: Potassium 4.0 - 5.1 mmol/L Magnesium: 2.0 - 2.4 mg/dL Other electrolytes WNL  Plan:   Continue potassium 20 mEq PO BID  No additional electrolyte supplementation warranted at this time  F/U electrolytes, including magnesium, with am labs.  Sallye Lat, PharmD Candidate 01/29/2019 11:58 AM

## 2019-01-29 NOTE — Progress Notes (Signed)
PT Cancellation Note  Patient Details Name: Meghan [name] MRN: [id number] DOB: 09-29-1950   Cancelled Treatment:    Reason Eval/Treat Not Completed: Other (comment).  Chart reviewed.  Pt noted to transfer to CCU 10/20 for Milrinone infusion.  D/t pt transferring to higher level of care, per PT protocol require new PT consult in order to continue therapy (will discontinue current PT order d/t this).  Please re-consult PT when pt is medically appropriate to participate in PT.   Leitha Bleak, PT 01/29/19, 11:16 AM [phone]

## 2019-01-29 NOTE — Consult Note (Addendum)
Name: Meghan [name] MRN: [id number] DOB: 09-24-1950    ADMISSION DATE:  01/17/2019 CONSULTATION DATE:  01/28/2019  REFERRING MD :  Dr. Margaretmary Eddy  CHIEF COMPLAINT:  Shortness of Breath  BRIEF PATIENT DESCRIPTION:  68 year old female admitted 01/17/2019 with acute hypoxic respiratory failure secondary to acute on chronic systolic CHF, acute blood loss anemia secondary to GI bleed, AKI on CKD, and right sided flank/back pain. Initially Required Lasix & Milrinone infusions while in ICU, able to be transferred out to [place] unit on 10/15.  On 10/21 she was noted to be significantly volume overloaded, along with decreasing urine output and worsening renal function, requiring transfer to ICU for Milrinone infusion. Cardiology and Nephrology are following.  SIGNIFICANT EVENTS  10/9>> Admission to ICU 10/9>> Cardiology,  Nephrology, & GI consulted consulted 10/9>> Placed on Milrinone infusion 10/15>>Transfer to Med-Surg 10/17>> EGD aborted due to hypoxia with minimal sedation 10/19>> Hemoglobin 7.1, received 1 unit pRBC's 10/20>> Transfer to ICU for Milrinone infusion  STUDIES:  10/9 - CT Abdomen & Pelvis:1. Mild fat stranding around the pancreas, likely from generalized volume overload but please correlate with serum enzymes. 2. Small volume ascites accumulated around the liver. 3. Cardiomegaly and atherosclerosis. 10/9 - Venous Ultrasound LLE>>negative   CULTURES: SARS-CoV-2 PCR 10/9>> negative  ANTIBIOTICS: Ceftriaxone 10/9>>10/13 stopped rocephin  HISTORY OF PRESENT ILLNESS:   Meghan [name] is a 68 year old female with a past medical history notable for chronic systolic CHF, CAD, persistent atrial fibrillation on Coumadin therapy, diabetes mellitus, chronic anemia, CKD, hyperlipidemia, carotid artery disease status post left carotid endarterectomy, previous CVA, and hypertension who presents to [facility] ED on 01/17/2019 with complaints of shortness of breath, rectal bleeding, and right flank pain.   She reports mild to moderate episodes of rectal bleeding for approximately 3 days with associated nausea and vomiting, and reports onset of right sided flank pain since yesterday evening.  She also endorses orthopnea, progressive lower extremity edema and weight gain.  She denies abdominal pain, diarrhea, fever, chills, chest pain, diaphoresis.  Upon presentation to the ED she was noted to be hypotensive with blood pressure 100/49, pulse 67 bpm, and afebrile.  Initial work-up in the ED reveals hemoglobin 6.7, BNP 790, high-sensitivity troponin 13, INR 2.2, creatinine 3.82, sodium 133.  Chest x-ray reveals pulmonary edema and cardiomegaly.  CT abdomen and pelvis reveals mild fat stranding around the pancreas and small volume ascites accumulated around the liver.  Urinalysis is currently pending.  She was given vitamin K and 40 mg IV Lasix, and is to receive 1 unit of packed red blood cells.  She is being admitted to stepdown unit by the hospitalist for further work-up and treatment of acute hypoxic respiratory failure in the setting of acute on chronic systolic CHF, acute blood loss anemia secondary to GI bleed, and right sided flank pain.  PCCM is consulted for further management.  Nephrology and Cardiology were consulted, and she was placed on Milrinone and Lasix infusions.  She was eventually able to be weaned off these infusions and transferred to Med-Surg unit on 01/23/19.  Attempt was made to perform EGD on 01/25/19, however she became hypoxic with minimal sedation, therefore procedure was aborted.  On 10/19 her hemoglobin decreased to 7.7, thus she received 1 unit pRBC's.  On 01/28/19 she was noted to be significantly volume overloaded, along with decreasing urine output and worsening renal function, requiring transfer to ICU for Milrinone infusion. Cardiology and Nephrology are following.  PCCM is again consulted for management of acute hypoxic  respiratory failure in the setting of acute on chronic HFrEF,  and AKI in the setting of cardiorenal syndrome requiring milrinone infusion while in ICU.  PAST MEDICAL HISTORY :   has a past medical history of Carotid arterial disease ([place]), CKD stage G3b/A1, GFR 30-44 and albumin creatinine ratio <30 mg/g, Coronary artery disease, Diabetes mellitus without complication ([place]), Hyperlipidemia, Hypertension, Ischemic cardiomyopathy, Morbid obesity ([place]), Normocytic anemia, Persistent atrial fibrillation ([place]), and Stroke ([place]) (2013).  has a past surgical history that includes Leg Surgery; Cholecystectomy; CARDIOVERSION (N/A, 07/28/2016); CARDIOVERSION (N/A, 08/07/2016); Carotid endarterectomy; Tonsillectomy; Amputation toe (Left, 09/22/2016); RIGHT/LEFT HEART CATH AND CORONARY ANGIOGRAPHY (Bilateral, 03/12/2017); foot infection (Right); Cardioversion (N/A, 01/03/2019); and Esophagogastroduodenoscopy (egd) with propofol (N/A, 01/25/2019). Prior to Admission medications   Medication Sig Start Date End Date Taking? Authorizing Provider  amiodarone (PACERONE) 200 MG tablet Take 200 mg by mouth 2 (two) times daily.   Yes [provider]  atorvastatin (LIPITOR) 40 MG tablet Take 1 tablet by mouth once daily Patient taking differently: Take 40 mg by mouth daily.  11/25/18  Yes Wellington Hampshire, MD  carvedilol (COREG) 6.25 MG tablet TAKE 1 TABLET BY MOUTH TWICE DAILY WITH A MEAL 01/06/19  Yes Wellington Hampshire, MD  glipiZIDE (GLUCOTROL) 10 MG tablet Take 10 mg by mouth 2 (two) times daily. Dr Honor Junes 08/13/15  Yes [provider]  Insulin Degludec 200 UNIT/ML SOPN Inject 25 Units into the skin daily at 12 noon. Dr Honor Junes (1300) 02/18/18  Yes [provider]  levothyroxine (SYNTHROID) 50 MCG tablet Take 50 mcg by mouth daily before breakfast.   Yes [provider]  losartan (COZAAR) 25 MG tablet Take 1 tablet (25 mg total) by mouth daily. 12/15/18 12/15/19 Yes Mody, Ulice Bold, MD  torsemide (DEMADEX) 20 MG tablet Take 2 tablets (40 mg total) by  mouth 2 (two) times daily. 01/09/19  Yes Wellington Hampshire, MD  triamcinolone cream (KENALOG) 0.5 % Apply 1 application topically 2 (two) times daily as needed (psoriasis).    Yes [provider]  warfarin (COUMADIN) 5 MG tablet USE AS DIRECTED BY COUMADIN CLINIC FOR 30 DAYS Patient not taking: Take one 5 mg tablet Sunday, Tuesday, Wednesday, Thursday, and Saturday. And one and one-half tablets 7.5 mg on Monday and Friday 11/25/18   Wellington Hampshire, MD   No Known Allergies  FAMILY HISTORY:  family history includes Diabetes in her maternal grandmother and mother; Heart disease in her father and mother; Hypertension in her father. SOCIAL HISTORY:  reports that she has quit smoking. Her smoking use included cigarettes. She has a 2.50 pack-year smoking history. She has never used smokeless tobacco. She reports that she does not drink alcohol or use drugs.   COVID-19 DISASTER DECLARATION:  FULL CONTACT PHYSICAL EXAMINATION WAS NOT POSSIBLE DUE TO TREATMENT OF COVID-19 AND  CONSERVATION OF PERSONAL PROTECTIVE EQUIPMENT, LIMITED EXAM FINDINGS INCLUDE-  Patient assessed or the symptoms described in the history of present illness.  In the context of the Global COVID-19 pandemic, which necessitated consideration that the patient might be at risk for infection with the SARS-CoV-2 virus that causes COVID-19, Institutional protocols and algorithms that pertain to the evaluation of patients at risk for COVID-19 are in a state of rapid change based on information released by regulatory bodies including the CDC and federal and state organizations. These policies and algorithms were followed during the patient's care while in hospital.  REVIEW OF SYSTEMS:  Positives in BOLD Constitutional: Negative for fever, chills, weight  loss, malaise/fatigue and diaphoresis.  HENT: Negative for hearing loss, ear pain, nosebleeds, congestion, sore throat, neck pain, tinnitus and ear discharge.   Eyes: Negative  for blurred vision, double vision, photophobia, pain, discharge and redness.  Respiratory: Negative for cough, hemoptysis, sputum production, +shortness of breath, wheezing and stridor.   Cardiovascular: Negative for chest pain, palpitations, orthopnea, claudication, leg swelling and PND.  Gastrointestinal: Negative for heartburn, nausea, vomiting, abdominal pain, diarrhea, constipation, blood in stool and melena.  Genitourinary: Negative for dysuria, urgency, frequency, hematuria and flank pain.  Musculoskeletal: Negative for myalgias, back pain, joint pain and falls.  Skin: Negative for itching and rash.  Neurological: Negative for dizziness, tingling, tremors, sensory change, speech change, focal weakness, seizures, loss of consciousness, weakness and headaches.  Endo/Heme/Allergies: Negative for environmental allergies and polydipsia. Does not bruise/bleed easily.  SUBJECTIVE:  Reports shortness of breath at times Denies chest pain, palpitations On nasal cannula Moved to ICU due to decreasing urine output and worsening renal function requiring milrinone infusion  VITAL SIGNS: Temp:  [97.4 F (36.3 C)-97.7 F (36.5 C)] 97.6 F (36.4 C) (10/20 2000) Pulse Rate:  [77-110] 87 (10/20 2310) Resp:  [18-29] 18 (10/20 2100) BP: (123-151)/(67-105) 151/97 (10/20 2310) SpO2:  [96 %-99 %] 98 % (10/20 2100) Weight:  [134.3 kg] 134.3 kg (10/20 0420)  PHYSICAL EXAMINATION: General: Acute on chronically ill-appearing female, laying in bed, on nasal cannula, no acute distress Neuro: Sleeping, arouses to voice, alert and oriented x3, follows commands, no focal deficits HEENT: Atraumatic, normocephalic, neck supple, no JVD, pupils PERRLA Cardiovascular: Irregular irregular rhythm, rate controlled, 1/6 systolic murmur Lungs: Clear diminished bilaterally, no wheezing, even, nonlabored, normal effort Abdomen: Obese, soft, nontender, nondistended, no guarding or rebound tenderness, bowel sounds positive  x4 Musculoskeletal: No deformities, 3+ bitting edema bilateral lower extremities Skin: Warm and dry, no obvious rashes, lesions, or ulcerations  Recent Labs  Lab 01/26/19 0616 01/27/19 0542 01/28/19 0523  NA 134* 136 132*  K 3.6 4.0 4.1  CL 94* 96* 92*  CO2 31 32 29  BUN 56* 56* 58*  CREATININE 1.93* 1.95* 2.20*  GLUCOSE 127* 140* 235*   Recent Labs  Lab 01/26/19 0616 01/27/19 0542 01/28/19 0105 01/28/19 0523 01/28/19 1423  HGB 8.1* 7.7* 8.6* 8.8* 9.2*  HCT 26.7* 25.8* 28.0* 28.2* 29.8*  WBC 6.7 8.7  --  7.5  --   PLT 196 191  --  184  --    [place] Ekg Site Rite  Result Date: 01/28/2019 If Site [place] not attached, placement could not be confirmed due to current cardiac rhythm.   ASSESSMENT / PLAN:  Acute hypoxic respiratory failure in the setting of acute on chronic HFrEF -Supplemental O2 as needed to maintain O2 sats greater than 92% -Follow intermittent chest x-ray and ABG as needed -IV Lasix as blood pressure and renal function permits  Acute on chronic HFrEF (LV EF 25-30% on Echo 12/2018) Persistent atrial fibrillation Hx: CAD -Continuous cardiac monitoring -Maintain MAP greater than 65 -Levophed if needed to maintain MAP goal -Cardiology following, appreciate input -IV Lasix as blood pressure and renal function permits -Continue milrinone infusion as per cardiology -Continue metoprolol 25 mg twice daily -Anticoagulation & aspirin on hold due to GI bleed this admission -Continue atorvastatin  AKI on CKD likely secondary to cardiorenal syndrome -Monitor I&O's / urinary output -Follow BMP -Ensure adequate renal perfusion -Avoid nephrotoxic agents as able -Replace electrolytes as indicated -Nephrology following, appreciate input -Continue milrinone infusion as per cardiology  Acute  GI bleed & Melena>> resolved Acute blood loss anemia -Monitor for signs and symptoms of bleeding -Trend CBC -Transfuse for hemoglobin less than 8 -Was given Vitamin K  to reverse Coudmadin -GI was following, signed off on case on 10/18 ~if active bleeding were to recur, GI recommends RBC scan or CTA abdomen at that time as patient is high risk for endoscopic procedures (attempt was made for EGD on 01/25/19, however attempt aborted due to hypoxia with minimal sedation) -Continue PPI twice daily  Diabetes mellitus -CBGs -Sliding scale insulin -Follow ICU hypohyperglycemia protocol  Hypothyroidism -Continue home Synthroid        Disposition: ICU Goals of care: Full code VTE prophylaxis: SCDs (no chemical prophylaxis given GI bleed this admission) Updates: Updated patient at bedside 01/29/2019  Darel Hong, AGACNP-BC [place] Pager: [phone] Cell: [phone]  01/29/2019, 12:02 AM   PCCM ATTENDING ATTESTATION:  I have evaluated patient with the APP, I personally  reviewed database in its entirety and discussed care plan in detail. In addition, this patient was discussed on multidisciplinary rounds.   I agree with assessment and plan.  Decompensated CHF with renal failure Progressive cardiorenal syndrome   Admitted for Milrinone infusion Continue to monitor Overall prognosis is poor   Corrin Parker, M.D.  [facility]  Medical Director [place] Director [facility] Cardio-P[facility] Department

## 2019-01-29 NOTE — Progress Notes (Signed)
Progress Note  Patient Name: Meghan [name] Date of Encounter: 01/29/2019  Primary Cardiologist: Kathlyn Sacramento, MD   Subjective   She feels very fatigued today.  She feels as if she is urinating more on the milrinone.  No chest pain, palpitations, or feeling of racing HR. She continues to note some intermittent shortness of breath.   Most recent labs were discussed with the daughter.  Inpatient Medications    Scheduled Meds: . sodium chloride   Intravenous Once  . atorvastatin  40 mg Oral Daily  . Chlorhexidine Gluconate Cloth  6 each Topical Q0600  . furosemide  80 mg Intravenous BID  . insulin aspart  0-5 Units Subcutaneous QHS  . insulin aspart  0-9 Units Subcutaneous TID WC  . insulin aspart  6 Units Subcutaneous TID WC  . insulin glargine  22 Units Subcutaneous QHS  . levothyroxine  50 mcg Oral QAC breakfast  . metoprolol tartrate  25 mg Oral BID  . multivitamin with minerals  1 tablet Oral Daily  . pantoprazole  40 mg Intravenous Q12H  . potassium chloride  20 mEq Oral BID  . predniSONE  20 mg Oral Q breakfast  . sodium chloride flush  3 mL Intravenous Q12H   Continuous Infusions: . sodium chloride 0 mL (01/17/19 1010)  . albumin human Stopped (01/29/19 1112)  . milrinone 0.25 mcg/kg/min (01/29/19 1230)   PRN Meds: sodium chloride, acetaminophen, HYDROcodone-acetaminophen, ondansetron (ZOFRAN) IV, polyethylene glycol, sodium chloride flush   Vital Signs    Vitals:   01/29/19 0900 01/29/19 1000 01/29/19 1100 01/29/19 1200  BP: 107/63 103/77 (!) 90/51 100/75  Pulse: 86 62 96 90  Resp: (!) 23 17 (!) 22 19  Temp:    98.7 F (37.1 C)  TempSrc:    Oral  SpO2: 97% 100% 95% 94%  Weight:      Height:        Intake/Output Summary (Last 24 hours) at 01/29/2019 1417 Last data filed at 01/29/2019 1200 Gross per 24 hour  Intake 379.74 ml  Output 2350 ml  Net -1970.26 ml   Last 3 Weights 01/29/2019 01/28/2019 01/27/2019  Weight (lbs) 285 lb 4.4 oz 296 lb 1.2 oz  293 lb 6.9 oz  Weight (kg) 129.4 kg 134.3 kg 133.1 kg      Telemetry    Afib/flutter with PVCs 80-120s. Previously atrial fibrillation with rates in the 80-low 100s, PVCs - Personally Reviewed  ECG    No new tracings- Personally Reviewed  Physical Exam   GEN: Somnolent.  Joined by daughter.  Neck: JVD difficult to assess due to body habitus, JVP elevated ~11cm Cardiac: irregular rhythm regular rate, 1/6 systolic murmur, rubs, or gallops.  Respiratory: Coarse breath sounds bilaterally and diminished at the bilateral bases GI: obese, soft, non-tender  MS: 2+ b/l lower extremity edema; No deformity. Neuro:  Nonfocal  Psych: Normal affect, somnolent   Labs    High Sensitivity Troponin:   Recent Labs  Lab 01/17/19 0011  TROPONINIHS 13      Cardiac EnzymesNo results for input(s): TROPONINI in the last 168 hours. No results for input(s): TROPIPOC in the last 168 hours.   Chemistry Recent Labs  Lab 01/27/19 0542 01/28/19 0523 01/29/19 0453  NA 136 132* 135  K 4.0 4.1 3.9  CL 96* 92* 94*  CO2 32 29 30  GLUCOSE 140* 235* 231*  BUN 56* 58* 58*  CREATININE 1.95* 2.20* 2.10*  CALCIUM 8.3* 8.4* 8.4*  GFRNONAA 25* 22* 23*  GFRAA 29* 25* 27*  ANIONGAP 8 11 11      Hematology Recent Labs  Lab 01/27/19 0542  01/28/19 0523  01/29/19 0211 01/29/19 0453 01/29/19 1352  WBC 8.7  --  7.5  --   --  6.8  --   RBC 2.96*  --  3.31*  --   --  3.06*  --   HGB 7.7*   < > 8.8*   < > 8.3* 8.1* 8.2*  HCT 25.8*   < > 28.2*   < > 26.6* 25.8* 26.2*  MCV 87.2  --  85.2  --   --  84.3  --   MCH 26.0  --  26.6  --   --  26.5  --   MCHC 29.8*  --  31.2  --   --  31.4  --   RDW 19.9*  --  19.5*  --   --  19.2*  --   PLT 191  --  184  --   --  192  --    < > = values in this interval not displayed.    BNPNo results for input(s): BNP, PROBNP in the last 168 hours.   DDimer No results for input(s): DDIMER in the last 168 hours.   Radiology    [place] Ekg Site Rite  Result Date:  01/28/2019 If [facility] image not attached, placement could not be confirmed due to current cardiac rhythm.   Cardiac Studies   2D echo 12/14/2018: 1. The left ventricle has severely reduced systolic function, with an ejection fraction of 25-30%. The cavity size was mildly dilated. Left ventricular diastolic Doppler parameters are indeterminate. Left ventricular diffuse hypokinesis. 2. The right ventricle has mildly reduced systolic function. The cavity was moderately enlarged. There is no increase in right ventricular wall thickness. Right ventricular systolic pressure is moderately elevated with an estimated pressure of 56.2  mmHg. 3. Left atrial size was severely dilated. 4. Right atrial size was moderately dilated. 5. Tricuspid valve regurgitation is moderate. 6. Rhythm is atrial fibrillation  Patient Profile     68 y.o. female with a history of CAD, HFrEF secondary to ICM, prior hyperkalemia with spironolactone in the setting of renal failure, persistent atrial fibrillation s/p recent DCCV 01/03/2019 that was briefly successful on amiodarone and Coumadin, CVA, anemia, carotid artery disease s/p left-sided CEA, DM, hypertension, hyperlipidemia, and is being seen today for the evaluation of HFrEF  Assessment & Plan    Acute on chronic HFrEF secondary to ICM with likely cardiorenal syndrome --Intermittent shortness of breath.  Still volume up. --12/2018 Echo above with EF 25-30%. --Diuresis has been complicated by renal function.  Transferred to the ICU and started on milrinone drip with aggressive diuresis and improved urine output over the last 24 hours. -1.9L over the last 24 hours. -20 L for the admission. Continue to monitor I/O, daily weights.  Goal urine output of -2 L daily. Current wt 129.4kg. Baseline weight (per office notes) at 131.7kg and most recent admission weights stable at 133.4kg. Previous 2019 were recorded to be closer to 112kg of note. --Continue daily BMET. Replete  K with goal 4.0. K 3.9. --Continue IV furosemide 80 mg twice daily with milrinone drip. --Continue metoprolol tartrate 25 mg twice daily with plan to consolidate to Toprol-XL prior to discharge. Current BP stable with start of BB. --AKI /CKD precludes ACE/ARB/Entreso/Spironolactone. --Consider addition of long acting nitrate and hydralazine at discharge /follow-up as an outpatient if BP tolerates.  Persistent atrial  flutter/atrial fibrillation with RVR --Asymptomatic.S/p recent DCCV 01/03/2019, briefly held SR on amiodarone after cardioversion. Continues to be in atrial fibrillation with PVCs. --Continue beta-blocker / lopressor 25mg  BID.  Unable to use digoxin due to current renal function.  Avoid CCB with low EF. Avoid IV amiodarone as she is not therapeutically anticoagulated with risk of thrombosis still associated with pharmacologic conversion.  Discontinued anticoagulation due to anemia / GIB. Would recommend further GIB workup before restart of anticoagulation, and until that time, current risks of anticoagulation outweigh those of benefits.  --No current plan for DCCV/TEE.  Not therapeutically anticoagulated. Asx.  CAD --Known severe CAD by catheterization 2018.  --Continue medical management as comorbid conditions allow.  Continue secondary prevention with atorvastatin.  Aspirin has been on hold in the setting of GIB.  GIB/blood loss anemia --Anticoagulation discontinued and patient pending transfusion after EGD aborted due to hypoxia. S/p transfusion. Current Hgb 8.1. Further workup recommended as above. Continue PPI.  AOCKDIII --Daily BMET. K 2.20  2.10. BUN 58. --Nephrology following.  No current indication for dialysis.  Likely AKI secondary to acute cardiorenal syndrome and CKD 2/2 DM2.  Milrinone started - continue with diuresis. Avoid nephrotoxins. Renally dose medications, if applicable. Avoid contrast.   For questions or updates, please contact [place] Please consult  [email or web address] for contact info under        Signed, Arvil Chaco, PA-C  01/29/2019, 2:17 PM

## 2019-01-30 DIAGNOSIS — D649 Anemia, unspecified: Secondary | ICD-10-CM | POA: Diagnosis not present

## 2019-01-30 DIAGNOSIS — N179 Acute kidney failure, unspecified: Secondary | ICD-10-CM | POA: Diagnosis not present

## 2019-01-30 DIAGNOSIS — N049 Nephrotic syndrome with unspecified morphologic changes: Secondary | ICD-10-CM | POA: Diagnosis not present

## 2019-01-30 DIAGNOSIS — I5043 Acute on chronic combined systolic (congestive) and diastolic (congestive) heart failure: Secondary | ICD-10-CM

## 2019-01-30 LAB — BASIC METABOLIC PANEL
Anion gap: 9 (ref 5–15)
BUN: 57 mg/dL — ABNORMAL HIGH (ref 8–23)
CO2: 31 mmol/L (ref 22–32)
Calcium: 8.5 mg/dL — ABNORMAL LOW (ref 8.9–10.3)
Chloride: 94 mmol/L — ABNORMAL LOW (ref 98–111)
Creatinine, Ser: 2.01 mg/dL — ABNORMAL HIGH (ref 0.44–1.00)
GFR calc Af Amer: 28 mL/min — ABNORMAL LOW (ref >60)
GFR calc non Af Amer: 25 mL/min — ABNORMAL LOW (ref >60)
Glucose, Bld: 225 mg/dL — ABNORMAL HIGH (ref 70–99)
Potassium: 4.3 mmol/L (ref 3.5–5.1)
Sodium: 134 mmol/L — ABNORMAL LOW (ref 135–145)

## 2019-01-30 LAB — GLUCOSE, CAPILLARY
Glucose-Capillary: 219 mg/dL — ABNORMAL HIGH (ref 70–99)
Glucose-Capillary: 227 mg/dL — ABNORMAL HIGH (ref 70–99)
Glucose-Capillary: 235 mg/dL — ABNORMAL HIGH (ref 70–99)
Glucose-Capillary: 321 mg/dL — ABNORMAL HIGH (ref 70–99)

## 2019-01-30 LAB — ALBUMIN: Albumin: 3.1 g/dL — ABNORMAL LOW (ref 3.5–5.0)

## 2019-01-30 LAB — PROTIME-INR
INR: 1.2 (ref 0.8–1.2)
Prothrombin Time: 15 seconds (ref 11.4–15.2)

## 2019-01-30 LAB — MAGNESIUM: Magnesium: 2.2 mg/dL (ref 1.7–2.4)

## 2019-01-30 NOTE — Progress Notes (Signed)
Patient ID: Meghan [name], female   DOB: 05/17/1950, 68 y.o.   MRN: [id number]  [facility] PROGRESS NOTE  Danialle Albers [id number] DOB: 27-Jun-1950 DOA: 01/17/2019 PCP: [name], MD  HPI/Subjective: Patient with good urine output, feels ok Comfortable on oxygen via [place] at 2 liter Patients EGD canceled on 01/25/19  as patient was hypoxemic with minimal sedation.  Objective: Vitals:   01/30/19 0800 01/30/19 0900  BP: 120/67 104/87  Pulse: 79 88  Resp: (!) 25 17  Temp:    SpO2: 97% 100%    Intake/Output Summary (Last 24 hours) at 01/30/2019 0956 Last data filed at 01/29/2019 2205 Gross per 24 hour  Intake 138.95 ml  Output 750 ml  Net -611.05 ml   Filed Weights   01/28/19 0420 01/29/19 0500 01/30/19 0500  Weight: 134.3 kg 129.4 kg 129.3 kg    ROS: Review of Systems  Constitutional: Negative for chills and fever.  Eyes: Negative for blurred vision.  Respiratory: Negative for cough and shortness of breath.   Cardiovascular: Negative for chest pain.  Gastrointestinal: Negative for abdominal pain, constipation, diarrhea, melena, nausea and vomiting.  Genitourinary: Negative for dysuria.  Musculoskeletal: Negative for joint pain.  Neurological: Negative for dizziness and headaches.   Exam: Physical Exam  Constitutional: She is oriented to person, place, and time.  HENT:  Nose: No mucosal edema.  Mouth/Throat: No oropharyngeal exudate or posterior oropharyngeal edema.  Eyes: Pupils are equal, round, and reactive to light. Conjunctivae, EOM and lids are normal.  Neck: No JVD present. Carotid bruit is not present. No edema present. No thyroid mass and no thyromegaly present.  Cardiovascular: S1 normal and S2 normal. Exam reveals no gallop.  No murmur heard. Pulses:      Dorsalis pedis pulses are 2+ on the right side and 2+ on the left side.  Respiratory: No respiratory distress. She has decreased breath sounds in the right lower field and the left lower field. She  has no wheezes. She has no rhonchi. She has rales in the right lower field and the left lower field.  GI: Soft. Bowel sounds are normal. There is no abdominal tenderness.  Anasarca  Musculoskeletal:     Right ankle: She exhibits swelling.     Left ankle: She exhibits swelling.  Lymphadenopathy:    She has no cervical adenopathy.  Neurological: She is alert and oriented to person, place, and time. No cranial nerve deficit.  Skin: Skin is warm. Nails show no clubbing.  Chronic lower extremity discoloration  Psychiatric: She has a normal mood and affect.      Data Reviewed: Basic Metabolic Panel: Recent Labs  Lab 01/24/19 0524  01/26/19 [id number] 01/27/19 0542 01/28/19 0523 01/29/19 0453 01/30/19 0536  NA 135   < > 134* 136 132* 135 134*  K 3.1*   < > 3.6 4.0 4.1 3.9 4.3  CL 92*   < > 94* 96* 92* 94* 94*  CO2 33*   < > 31 32 29 30 31   GLUCOSE 158*   < > 127* 140* 235* 231* 225*  BUN 55*   < > 56* 56* 58* 58* 57*  CREATININE 1.83*   < > 1.93* 1.95* 2.20* 2.10* 2.01*  CALCIUM 8.2*   < > 8.2* 8.3* 8.4* 8.4* 8.5*  MG 2.1  --   --   --  2.1 2.0 2.2   < > = values in this interval not displayed.   CBC: Recent Labs  Lab 01/26/19 0616 01/27/19 0542  01/28/19 0523 01/28/19 1423 01/29/19 0211 01/29/19 0453 01/29/19 1352  WBC 6.7 8.7  --  7.5  --   --  6.8  --   HGB 8.1* 7.7*   < > 8.8* 9.2* 8.3* 8.1* 8.2*  HCT 26.7* 25.8*   < > 28.2* 29.8* 26.6* 25.8* 26.2*  MCV 86.1 87.2  --  85.2  --   --  84.3  --   PLT 196 191  --  184  --   --  192  --    < > = values in this interval not displayed.   BNP (last 3 results) Recent Labs    12/13/18 1136 01/17/19 0011  BNP 1,269.0* 790.0*     CBG: Recent Labs  Lab 01/29/19 0723 01/29/19 1131 01/29/19 1627 01/29/19 2152 01/30/19 0732  GLUCAP 188* 189* 189* 252* 219*    No results found for this or any previous visit (from the past 240 hour(s)).    Scheduled Meds: . sodium chloride   Intravenous Once  . atorvastatin  40 mg  Oral Daily  . Chlorhexidine Gluconate Cloth  6 each Topical Q0600  . furosemide  80 mg Intravenous BID  . insulin aspart  0-5 Units Subcutaneous QHS  . insulin aspart  0-9 Units Subcutaneous TID WC  . insulin aspart  6 Units Subcutaneous TID WC  . insulin glargine  22 Units Subcutaneous QHS  . levothyroxine  50 mcg Oral QAC breakfast  . metoprolol tartrate  25 mg Oral BID  . multivitamin with minerals  1 tablet Oral Daily  . pantoprazole  40 mg Intravenous Q12H  . potassium chloride  20 mEq Oral BID  . predniSONE  20 mg Oral Q breakfast  . sodium chloride flush  3 mL Intravenous Q12H   Continuous Infusions: . sodium chloride 0 mL (01/17/19 1010)  . albumin human Stopped (01/29/19 1112)  . milrinone 0.25 mcg/kg/min (01/30/19 0542)    Assessment/Plan:  1. Acute on chronic systolic congestive heart failure., Improving slowly   continue milrinone drip with Lasix 80 IV every 12 for diuresis, follow up cardiology recommendations 2. Acute hypoxic respiratory failure.  Patient on 2 L of oxygen and normally she does not wear oxygen.  Wean down as tolerated 3. Cardiogenic shock.  Decrease Solu-Cortef to 50 mg daily dose, changed to p.o. prednisone.  Holding other antihypertensive medications. 4. GI bleed and melena.  Vitamin K given to reverse Coumadin. Reconsulted GI, seen by Dr. Bonna Gains, scheduled for EGD on 01/25/2019 but the procedure was canceled as patient became hypoxemic significantly with minimal sedation.  No active bleeding ,hemoglobin 8.1 -8.0 -8.1, Coumadin resumed has GI and cardiology are agreeable to resume that patient on IV Protonix twice daily.  we will get tagged RBC scan if patient starts bleeding actively.  01/27/2019 hemoglobin trended down to 7.7, cardiology has recommended to stop Coumadin as the patient failed the Coumadin challenge and also recommending to keep her hemoglobin greater than 8.  1 unit of blood transfusion ordered with IV Lasix prior to the transfusion.   01/28/2019-repeat hemoglobin is at 8.8, will closely monitor hemoglobin hematocrit twice daily and transfuse as needed.  01/29/2019 hemoglobin is at 8.1 5. Acute blood loss anemia.  Status post multiple transfusions.  GI is recommending bleeding scan if patient starts bleeding again.  GI signed off 6. Acute kidney injury on chronic kidney disease 3 secondary to cardiorenal syndrome.  Creatinine improving with diuresis.  Nephrology is following, agreeable to continue IV Lasix and milrinone  drip 7. Persistent atrial fibrillation status post cardioversion and atrial fibrillation.  Amiodarone.  Coumadin discontinued, as patient failed Coumadin challenge.  For frequent PVCs we will continue beta-blocker and cardiology notified 8. type 2 diabetes mellitus on sliding scale insulin and long-acting insulin. 9. History of CVA -discontinue Coumadin in view of GI bleed and failed Coumadin challenge.   10. Weakness physical therapy evaluation appreciated.  PT recommending home with home health. 11. Urinary tract infection treated with 5 days of Rocephin.  12.  Head lice-permethrin treatment given on 01/25/2019, reassessment no lice infestation noticed by the RN  13.  Generalized weakness physical therapy consult-patient is almost bedbound needs assistance with transfers at her baseline  Code Status: Full code    Code Status Orders  (From admission, onward)         Start     Ordered   01/17/19 0302  Full code  Continuous     01/17/19 0306        Code Status History    Date Active Date Inactive Code Status Order ID Comments User Context   12/13/2018 1415 12/15/2018 1952 Full Code [id number]  Otila Back, MD ED   03/30/2017 2002 04/01/2017 1940 DNR [id number]  Demetrios Loll, MD Inpatient   03/12/2017 1236 03/12/2017 1720 Full Code [id number]  Wellington Hampshire, MD Inpatient   09/21/2016 2056 09/24/2016 1922 Full Code [id number]  Theodoro Grist, MD Inpatient   07/26/2016 1530 07/28/2016 1812 Full Code [id number]   Theodoro Grist, MD Inpatient   Advance Care Planning Activity     Disposition Plan: To be determined  Consultants:  Cardiology  Nephrology  Gastroenterology  Time spent: 34 minutes, case discussed with cardiology, nephrology and intensivist.  [facility]

## 2019-01-30 NOTE — Consult Note (Signed)
PHARMACY CONSULT NOTE  Pharmacy Consult for Electrolyte Monitoring and Replacement   Recent Labs: Potassium (mmol/L)  Date Value  01/30/2019 4.3  07/10/2013 3.6   Magnesium (mg/dL)  Date Value  01/30/2019 2.2  07/10/2013 1.5 (L)   Calcium (mg/dL)  Date Value  01/30/2019 8.5 (L)   Calcium, Total (mg/dL)  Date Value  07/10/2013 8.1 (L)   Albumin (g/dL)  Date Value  01/30/2019 3.1 (L)  06/15/2017 3.5 (L)  07/08/2013 1.7 (L)   Phosphorus (mg/dL)  Date Value  01/21/2019 3.3   Sodium (mmol/L)  Date Value  01/30/2019 134 (L)  12/24/2018 135  07/10/2013 134 (L)   Corrected Ca: 9.74 mg/dL  Assessment: 69 y.o. female with medical problems of chronic systolic congestive heart failure, coronary disease, atrial fibrillation requiring anticoagulation, diabetes, anemia, carotid artery disease with history of left carotid endarterectomy, previous stroke, hypertension, chronic kidney disease, was admitted on10/9/2020with shortness of breath worsening edema, acute kidney injury. She was on furosemide 60 mg IV BID which was d/c on 10/19 and changed to torsemide 40 mg daily and she remains on oral KCl 20 mEq BID.  Torsemide 40mg  daily Dcd 10/19. Furosemide 80mg  BID ordered   Scr:  1.93>> 1.95 >> 2.20 >>2.1>>2.01  Goal of Therapy:  Given cardiac history: Potassium 4.0 - 5.1 mmol/L Magnesium: 2.0 - 2.4 mg/dL Other electrolytes WNL  Plan:   Continue potassium 20 mEq PO BID  No additional electrolyte supplementation warranted at this time  Recheck electrolytes with am labs.  Sallye Lat, PharmD Candidate 01/30/2019 11:22 AM

## 2019-01-30 NOTE — Progress Notes (Signed)
Inpatient Diabetes Program Recommendations  AACE/ADA: New Consensus Statement on Inpatient Glycemic Control   Target Ranges:  Prepandial:   less than 140 mg/dL      Peak postprandial:   less than 180 mg/dL (1-2 hours)      Critically ill patients:  140 - 180 mg/dL   Results for SAFFRON, SHARTZER (MRN [id number]) as of 01/30/2019 08:43  Ref. Range 01/29/2019 07:23 01/29/2019 11:31 01/29/2019 16:27 01/29/2019 21:52 01/30/2019 07:32  Glucose-Capillary Latest Ref Range: 70 - 99 mg/dL 188 (H) 189 (H) 189 (H) 252 (H) 219 (H)   Review of Glycemic Control  Diabetes history:DM2 Outpatient Diabetes medications:Tresiba 25 units daily, Glipizide 10 mg BID Current orders for Inpatient glycemic control:Lantus 22 units QHS, Novolog 0-9 units TID with meals, Novolog 0-5 units QHS, Novolog 6 units TID with meals for meal coverage; Prednisone 20 mg QAM   Inpatient Diabetes Program Recommendations:   Insulin-Basal: If steroids are continued as ordered, please consider increasing Lantus to 24 units QHS.  Thanks, Barnie Alderman, RN, MSN, CDE Diabetes Coordinator Inpatient Diabetes Program [phone] (Team Pager from 8am to 5pm)

## 2019-01-30 NOTE — Progress Notes (Signed)
On Milrinone infusion VS STABLE Follow up Cardiology and Nephrology recs  Oxygen as needed  Vital signs reviewed  Will sign off at this time. No further recommendations at this time.  Please call [phone] for further questions. Thank you.    Corrin Parker, M.D.  [facility]  Medical Director [place] Director [facility] Cardio-Pulmonary Department

## 2019-01-30 NOTE — Progress Notes (Signed)
[facility]  ROUNDING NOTE   Subjective:   milrinone gtt.   Patient states she is doing well and breathing better. Eating breakfast during encounter.   UOP 1550 (1971mL)  Objective:  Vital signs in last 24 hours:  Temp:  [96.9 F (36.1 C)-98.8 F (37.1 C)] 96.9 F (36.1 C) (10/22 0748) Pulse Rate:  [54-120] 116 (10/22 0500) Resp:  [11-23] 14 (10/22 0500) BP: (88-120)/(51-92) 117/73 (10/22 0500) SpO2:  [86 %-100 %] 98 % (10/22 0500) Weight:  [129.3 kg] 129.3 kg (10/22 0500)  Weight change: -0.1 kg Filed Weights   01/28/19 0420 01/29/19 0500 01/30/19 0500  Weight: 134.3 kg 129.4 kg 129.3 kg    Intake/Output: I/O last 3 completed shifts: In: 400.2 [P.O.:120; I.V.:241.4; IV Piggyback:38.8] Out: 2300 [Urine:2300]   Intake/Output this shift:  No intake/output data recorded.  Physical Exam: General: NAD,   Head: Normocephalic, atraumatic. Moist oral mucosal membranes  Eyes: Anicteric, PERRL  Neck: Supple, trachea midline  Lungs:  Crackles bilateral bases, 2 L [employer] O2  Heart: Regular rate and rhythm  Abdomen:  Soft, nontender,   Extremities:  + peripheral edema.  Neurologic: Nonfocal, moving all four extremities  Skin: No lesions        Basic Metabolic Panel: Recent Labs  Lab 01/24/19 0524  01/26/19 0616 01/27/19 0542 01/28/19 0523 01/29/19 0453 01/30/19 0536  NA 135   < > 134* 136 132* 135 134*  K 3.1*   < > 3.6 4.0 4.1 3.9 4.3  CL 92*   < > 94* 96* 92* 94* 94*  CO2 33*   < > 31 32 29 30 31   GLUCOSE 158*   < > 127* 140* 235* 231* 225*  BUN 55*   < > 56* 56* 58* 58* 57*  CREATININE 1.83*   < > 1.93* 1.95* 2.20* 2.10* 2.01*  CALCIUM 8.2*   < > 8.2* 8.3* 8.4* 8.4* 8.5*  MG 2.1  --   --   --  2.1 2.0 2.2   < > = values in this interval not displayed.    Liver Function Tests: No results for input(s): AST, ALT, ALKPHOS, BILITOT, PROT, ALBUMIN in the last 168 hours. No results for input(s): LIPASE, AMYLASE in the last 168 hours. No results for  input(s): AMMONIA in the last 168 hours.  CBC: Recent Labs  Lab 01/26/19 0616 01/27/19 0542  01/28/19 0523 01/28/19 1423 01/29/19 0211 01/29/19 0453 01/29/19 1352  WBC 6.7 8.7  --  7.5  --   --  6.8  --   HGB 8.1* 7.7*   < > 8.8* 9.2* 8.3* 8.1* 8.2*  HCT 26.7* 25.8*   < > 28.2* 29.8* 26.6* 25.8* 26.2*  MCV 86.1 87.2  --  85.2  --   --  84.3  --   PLT 196 191  --  184  --   --  192  --    < > = values in this interval not displayed.    Cardiac Enzymes: No results for input(s): CKTOTAL, CKMB, CKMBINDEX, TROPONINI in the last 168 hours.  BNP: Invalid input(s): POCBNP  CBG: Recent Labs  Lab 01/29/19 0723 01/29/19 1131 01/29/19 1627 01/29/19 2152 01/30/19 0732  GLUCAP 188* 189* 189* 252* 219*    Microbiology: Results for orders placed or performed during the hospital encounter of 01/17/19  SARS Coronavirus 2 by RT PCR (hospital order, performed in [facility] hospital lab) Nasopharyngeal Nasopharyngeal Swab     Status: None   Collection Time:  01/17/19  1:41 AM   Specimen: Nasopharyngeal Swab  Result Value Ref Range Status   SARS Coronavirus 2 NEGATIVE NEGATIVE Final    Comment: (NOTE) If result is NEGATIVE SARS-CoV-2 target nucleic acids are NOT DETECTED. The SARS-CoV-2 RNA is generally detectable in upper and lower  respiratory specimens during the acute phase of infection. The lowest  concentration of SARS-CoV-2 viral copies this assay can detect is 250  copies / mL. A negative result does not preclude SARS-CoV-2 infection  and should not be used as the sole basis for treatment or other  patient management decisions.  A negative result may occur with  improper specimen collection / handling, submission of specimen other  than nasopharyngeal swab, presence of viral mutation(s) within the  areas targeted by this assay, and inadequate number of viral copies  (<250 copies / mL). A negative result must be combined with clinical  observations, patient history, and  epidemiological information. If result is POSITIVE SARS-CoV-2 target nucleic acids are DETECTED. The SARS-CoV-2 RNA is generally detectable in upper and lower  respiratory specimens dur ing the acute phase of infection.  Positive  results are indicative of active infection with SARS-CoV-2.  Clinical  correlation with patient history and other diagnostic information is  necessary to determine patient infection status.  Positive results do  not rule out bacterial infection or co-infection with other viruses. If result is PRESUMPTIVE POSTIVE SARS-CoV-2 nucleic acids MAY BE PRESENT.   A presumptive positive result was obtained on the submitted specimen  and confirmed on repeat testing.  While 2019 novel coronavirus  (SARS-CoV-2) nucleic acids may be present in the submitted sample  additional confirmatory testing may be necessary for epidemiological  and / or clinical management purposes  to differentiate between  SARS-CoV-2 and other Sarbecovirus currently known to infect humans.  If clinically indicated additional testing with an alternate test  methodology [phone]) is advised. The SARS-CoV-2 RNA is generally  detectable in upper and lower respiratory sp ecimens during the acute  phase of infection. The expected result is Negative. Fact Sheet for Patients:  StrictlyIdeas.no Fact Sheet for Healthcare Providers: BankingDealers.co.za This test is not yet approved or cleared by the [place] FDA and has been authorized for detection and/or diagnosis of SARS-CoV-2 by FDA under an Emergency Use Authorization (EUA).  This EUA will remain in effect (meaning this test can be used) for the duration of the COVID-19 declaration under Section 564(b)(1) of the Act, 21 U.S.C. section 360bbb-3(b)(1), unless the authorization is terminated or revoked sooner. Performed at [facility], [address] [place], [place] [address]   MRSA PCR  Screening     Status: None   Collection Time: 01/17/19  4:44 AM   Specimen: Nasal Mucosa; Nasopharyngeal  Result Value Ref Range Status   MRSA by PCR NEGATIVE NEGATIVE Final    Comment:        The GeneXpert MRSA Assay (FDA approved for NASAL specimens only), is one component of a comprehensive MRSA colonization surveillance program. It is not intended to diagnose MRSA infection nor to guide or monitor treatment for MRSA infections. Performed at [facility], [address], [place], [place] [address]   Urine Culture     Status: Abnormal   Collection Time: 01/17/19 11:52 AM   Specimen: Urine, Clean Catch  Result Value Ref Range Status   Specimen Description   Final    URINE, CLEAN CATCH Performed at [facility], [address], [place], [place] [address]  Special Requests   Final    Normal Performed at [facility], [place], [place] [address]    Culture >=100,000 COLONIES/mL ESCHERICHIA COLI (A)  Final   Report Status 01/19/2019 FINAL  Final   Organism ID, Bacteria ESCHERICHIA COLI (A)  Final      Susceptibility   Escherichia coli - MIC*    AMPICILLIN >=32 RESISTANT Resistant     CEFAZOLIN <=4 SENSITIVE Sensitive     CEFTRIAXONE <=1 SENSITIVE Sensitive     CIPROFLOXACIN >=4 RESISTANT Resistant     GENTAMICIN <=1 SENSITIVE Sensitive     IMIPENEM <=0.25 SENSITIVE Sensitive     NITROFURANTOIN <=16 SENSITIVE Sensitive     TRIMETH/SULFA <=20 SENSITIVE Sensitive     AMPICILLIN/SULBACTAM 8 SENSITIVE Sensitive     PIP/TAZO <=4 SENSITIVE Sensitive     Extended ESBL NEGATIVE Sensitive     * >=100,000 COLONIES/mL ESCHERICHIA COLI    Coagulation Studies: Recent Labs    01/28/19 0523 01/29/19 0453 01/30/19 0536  LABPROT 15.7* 16.2* 15.0  INR 1.3* 1.3* 1.2    Urinalysis: No results for input(s): COLORURINE, LABSPEC, PHURINE, GLUCOSEU, HGBUR, BILIRUBINUR, KETONESUR, PROTEINUR, UROBILINOGEN, NITRITE, LEUKOCYTESUR in the last  72 hours.  Invalid input(s): APPERANCEUR    Imaging: [place] Ekg Site Rite  Result Date: 01/28/2019 If Site Rite image not attached, placement could not be confirmed due to current cardiac rhythm.    Medications:   . sodium chloride 0 mL (01/17/19 1010)  . albumin human Stopped (01/29/19 1112)  . milrinone 0.25 mcg/kg/min (01/30/19 0542)   . sodium chloride   Intravenous Once  . atorvastatin  40 mg Oral Daily  . Chlorhexidine Gluconate Cloth  6 each Topical Q0600  . furosemide  80 mg Intravenous BID  . insulin aspart  0-5 Units Subcutaneous QHS  . insulin aspart  0-9 Units Subcutaneous TID WC  . insulin aspart  6 Units Subcutaneous TID WC  . insulin glargine  22 Units Subcutaneous QHS  . levothyroxine  50 mcg Oral QAC breakfast  . metoprolol tartrate  25 mg Oral BID  . multivitamin with minerals  1 tablet Oral Daily  . pantoprazole  40 mg Intravenous Q12H  . potassium chloride  20 mEq Oral BID  . predniSONE  20 mg Oral Q breakfast  . sodium chloride flush  3 mL Intravenous Q12H   sodium chloride, acetaminophen, HYDROcodone-acetaminophen, ondansetron (ZOFRAN) IV, polyethylene glycol, sodium chloride flush  Assessment/ Plan:  Meghan [name] is a 68 y.o. white female with chronic systolic congestive heart failure, coronary disease, atrial fibrillation on anticoagulation, diabetes mellitus type II, anemia, carotid artery disease with history of left carotid endarterectomy, stroke, hypertension, chronic kidney disease stage III, was admitted on10/9/2020with shortness of breath worsening edema, acute kidney injury.  1. Acute kidney injury on Chronic kidney disease stage IIIa with proteinuria and hematuria Baseline creatinine of 1.15, GFR of 49 on 06/10/18.  Chronic kidney disease secondary to diabetic nephropathy Acute renal failure secondary to acute cardiorenal syndrome   No indication for dialysis  - Continue furosemide 80mg  IV q12 - IV albumin as needed.   2. Acute  exacerbation of systolic congestive heart failure.  IV furosemide as above.  - Milirone as per cardiology  3. Hypertension: Changed carvedilol to metoprolol this admission.  Holding losartan due to renal failure.   4. Urinary tract infection: E. Coli. Completed course of antibiotics.   5. Hypokalemia  - potassium replaced.   6. GI bleed with acute blood  loss anemia and acute renal failure - holding warfarin. PRBC transfusion on 10/19 Hemoglobin stable at 8.2    LOS: 13 Joselyn Edling 10/22/20208:42 AM

## 2019-01-30 NOTE — Progress Notes (Signed)
Progress Note  Patient Name: Meghan [name] Date of Encounter: 01/30/2019  Primary Cardiologist: Kathlyn Sacramento, MD  Subjective   Feels well this AM.  Slept well last night.  Sitting up and eating currently.  Denies dyspnea, cp, or palpitations.  Notes good diuretic response.  Inpatient Medications    Scheduled Meds:  sodium chloride   Intravenous Once   atorvastatin  40 mg Oral Daily   Chlorhexidine Gluconate Cloth  6 each Topical Q0600   furosemide  80 mg Intravenous BID   insulin aspart  0-5 Units Subcutaneous QHS   insulin aspart  0-9 Units Subcutaneous TID WC   insulin aspart  6 Units Subcutaneous TID WC   insulin glargine  22 Units Subcutaneous QHS   levothyroxine  50 mcg Oral QAC breakfast   metoprolol tartrate  25 mg Oral BID   multivitamin with minerals  1 tablet Oral Daily   pantoprazole  40 mg Intravenous Q12H   potassium chloride  20 mEq Oral BID   predniSONE  20 mg Oral Q breakfast   sodium chloride flush  3 mL Intravenous Q12H   Continuous Infusions:  sodium chloride 0 mL (01/17/19 1010)   albumin human Stopped (01/29/19 1112)   milrinone 0.25 mcg/kg/min (01/30/19 1053)   PRN Meds: sodium chloride, acetaminophen, HYDROcodone-acetaminophen, ondansetron (ZOFRAN) IV, polyethylene glycol, sodium chloride flush   Vital Signs    Vitals:   01/30/19 0700 01/30/19 0748 01/30/19 0800 01/30/19 0900  BP: 108/76  120/67 104/87  Pulse: 90  79 88  Resp: (!) 22  (!) 25 17  Temp:  (!) 96.9 F (36.1 C)    TempSrc:  Axillary    SpO2: 97%  97% 100%  Weight:      Height:        Intake/Output Summary (Last 24 hours) at 01/30/2019 1136 Last data filed at 01/30/2019 0917 Gross per 24 hour  Intake 378.95 ml  Output 750 ml  Net -371.05 ml   Filed Weights   01/28/19 0420 01/29/19 0500 01/30/19 0500  Weight: 134.3 kg 129.4 kg 129.3 kg    Physical Exam   GEN: Obese, no acute distress.  HEENT: Grossly normal.  Neck: Supple, obese, difficult to  gauge jvp. No carotid bruits, or masses. Cardiac: IR, IR, tachy, no murmurs, rubs, or gallops. No clubbing, cyanosis.  1+ bilat LE edema extending to lower post thighs bilat.  Radials/DP/PT 1+ and equal bilaterally.  Respiratory:  Respirations regular and unlabored, crackles 1/3 way up bilat. GI: Obese, soft, nontender, nondistended, BS + x 4. MS: no deformity or atrophy. Skin: warm and dry, no rash. Neuro:  Strength and sensation are intact. Psych: AAOx3.  Normal affect.  Labs    Chemistry Recent Labs  Lab 01/28/19 0523 01/29/19 0453 01/30/19 0536  NA 132* 135 134*  K 4.1 3.9 4.3  CL 92* 94* 94*  CO2 29 30 31   GLUCOSE 235* 231* 225*  BUN 58* 58* 57*  CREATININE 2.20* 2.10* 2.01*  CALCIUM 8.4* 8.4* 8.5*  ALBUMIN  --   --  3.1*  GFRNONAA 22* 23* 25*  GFRAA 25* 27* 28*  ANIONGAP 11 11 9      Hematology Recent Labs  Lab 01/27/19 0542  01/28/19 0523  01/29/19 0211 01/29/19 0453 01/29/19 1352  WBC 8.7  --  7.5  --   --  6.8  --   RBC 2.96*  --  3.31*  --   --  3.06*  --   HGB 7.7*   < >  8.8*   < > 8.3* 8.1* 8.2*  HCT 25.8*   < > 28.2*   < > 26.6* 25.8* 26.2*  MCV 87.2  --  85.2  --   --  84.3  --   MCH 26.0  --  26.6  --   --  26.5  --   MCHC 29.8*  --  31.2  --   --  31.4  --   RDW 19.9*  --  19.5*  --   --  19.2*  --   PLT 191  --  184  --   --  192  --    < > = values in this interval not displayed.    Cardiac Enzymes  Recent Labs  Lab 01/17/19 0011  TROPONINIHS 13       Radiology    [place] Ekg Site Rite  Result Date: 01/28/2019 If [facility] image not attached, placement could not be confirmed due to current cardiac rhythm.   Telemetry    Afib, 90's - Personally Reviewed  Cardiac Studies   2D echo 12/14/2018: 1. The left ventricle has severely reduced systolic function, with an ejection fraction of 25-30%. The cavity size was mildly dilated. Left ventricular diastolic Doppler parameters are indeterminate. Left ventricular diffuse hypokinesis. 2. The  right ventricle has mildly reduced systolic function. The cavity was moderately enlarged. There is no increase in right ventricular wall thickness. Right ventricular systolic pressure is moderately elevated with an estimated pressure of 56.2  mmHg. 3. Left atrial size was severely dilated. 4. Right atrial size was moderately dilated. 5. Tricuspid valve regurgitation is moderate. 6. Rhythm is atrial fibrillation  Patient Profile     68 y.o. 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  Assessment & Plan    1.  Acute on chronic systolic CHF/ICM: EF [id number] by echo 12/2018.  Admitted 10/9 due to progressive dyspnea and volume overload despite adjustment of outpt diuretics.  Creat 3.82 on admission 10/9.  This initially improved w/ diuresis but then rose again to 2.2 on 10/20.  Tx to ICU 10/20 for cardiorenal syndrome and ongoing low ouput CHF req initiation of milrinone. Minus 1.2L overnight and  20.9L since admission.  Wt down 10kg - now 129.3 kg.  True dry weight difficult to gauge as wts have been variable over the past year, though she remains significantly volume overloaded. Cont IV lasix today.  If no contraindication from a nephrology standpoint, we should consider placing a picc line to measure CVPs and Co-Ox as body habitus makes exam challenging.  This will help guide [place] as we wean milrinone - hopefully over the next few days.  Will d/w Dr. Rockey Situ.  Cont  blocker w/ plan to transition to metoprolol succinate prior to d/c.  No acei/arb/arni/mra in setting of AKI.  Cont current dose of milrinone.  2.  Anemia/GIB:  Reported BRBPR on admission.  H/H stable.  [place] d/c'd after rechallenge w/ subsequent  drop in H/H 10/21.  3.  Persistent Afib:  Rates mostly in 90's on oral  blocker. Coumadin held 2/2 GIB.  Recently failed dccv and amio.  Not currently a candidate for rhythm mgmt as she is off of [place].  If rates are difficult  to control going forward and/or persistent afib continues to worsen CHF, we will need to strongly consider EP referral for PPM and AVN ablation.  3.  Acute on chronic stage III kidney dzs/Cardiorenal syndrome:  Creat improved following initiation of milrinone.  Creat 2.01 this AM.  Nephrology on board.  4.  CAD:  No chest pain. HsTrop nl on admission.  Cont  blocker/statin.  ASA on hold 2/2 #2.  5.  DMII:  Insulin per IM.  Consider SGLT2i as outpt.  Signed, Murray Hodgkins, NP  01/30/2019, 11:36 AM    For questions or updates, please contact   Please consult [email or web address] for contact info under Cardiology/STEMI.

## 2019-01-31 ENCOUNTER — Encounter: Payer: Self-pay | Admitting: Gastroenterology

## 2019-01-31 ENCOUNTER — Telehealth: Payer: Self-pay | Admitting: Gastroenterology

## 2019-01-31 DIAGNOSIS — N179 Acute kidney failure, unspecified: Secondary | ICD-10-CM | POA: Diagnosis not present

## 2019-01-31 DIAGNOSIS — I5023 Acute on chronic systolic (congestive) heart failure: Secondary | ICD-10-CM | POA: Diagnosis not present

## 2019-01-31 DIAGNOSIS — N049 Nephrotic syndrome with unspecified morphologic changes: Secondary | ICD-10-CM | POA: Diagnosis not present

## 2019-01-31 DIAGNOSIS — D649 Anemia, unspecified: Secondary | ICD-10-CM | POA: Diagnosis not present

## 2019-01-31 LAB — BASIC METABOLIC PANEL
Anion gap: 10 (ref 5–15)
BUN: 57 mg/dL — ABNORMAL HIGH (ref 8–23)
CO2: 31 mmol/L (ref 22–32)
Calcium: 8.7 mg/dL — ABNORMAL LOW (ref 8.9–10.3)
Chloride: 93 mmol/L — ABNORMAL LOW (ref 98–111)
Creatinine, Ser: 1.87 mg/dL — ABNORMAL HIGH (ref 0.44–1.00)
GFR calc Af Amer: 31 mL/min — ABNORMAL LOW (ref >60)
GFR calc non Af Amer: 27 mL/min — ABNORMAL LOW (ref >60)
Glucose, Bld: 211 mg/dL — ABNORMAL HIGH (ref 70–99)
Potassium: 4.2 mmol/L (ref 3.5–5.1)
Sodium: 134 mmol/L — ABNORMAL LOW (ref 135–145)

## 2019-01-31 LAB — PROTIME-INR
INR: 1.2 (ref 0.8–1.2)
Prothrombin Time: 14.9 seconds (ref 11.4–15.2)

## 2019-01-31 LAB — GLUCOSE, CAPILLARY
Glucose-Capillary: 188 mg/dL — ABNORMAL HIGH (ref 70–99)
Glucose-Capillary: 252 mg/dL — ABNORMAL HIGH (ref 70–99)
Glucose-Capillary: 268 mg/dL — ABNORMAL HIGH (ref 70–99)
Glucose-Capillary: 245 mg/dL — ABNORMAL HIGH (ref 70–99)

## 2019-01-31 NOTE — Progress Notes (Signed)
Inpatient Diabetes Program Recommendations  AACE/ADA: New Consensus Statement on Inpatient Glycemic Control (2015)  Target Ranges:  Prepandial:   less than 140 mg/dL      Peak postprandial:   less than 180 mg/dL (1-2 hours)      Critically ill patients:  140 - 180 mg/dL   Results for TESSLA, GOLDSON (MRN [id number]) as of 01/31/2019 13:48  Ref. Range 01/30/2019 07:32 01/30/2019 11:58 01/30/2019 16:26 01/30/2019 21:14  Glucose-Capillary Latest Ref Range: 70 - 99 mg/dL 219 (H)  9 units NOVOLOG  227 (H)  9 units NOVOLOG  235 (H)  9 units NOVOLOG  321 (H)  4 units NOVOLOG +  22 units LANTUS   Results for MAKETA, JILLSON (MRN [id number]) as of 01/31/2019 13:48  Ref. Range 01/31/2019 07:32 01/31/2019 11:32  Glucose-Capillary Latest Ref Range: 70 - 99 mg/dL 188 (H)  8 units NOVOLOG  252 (H)  11 units NOVOLOG     Home DM Meds: Tresiba 25 units Daily       Glipizide 10 mg BID  Current Orders: Lantus 22 units QHS       Novolog Sensitive Correction Scale/ SSI (0-9 units) TID AC + HS      Novolog 6 units TID with meals    Prednisone 20 mg Daily.    MD- Please consider the following in-hospital insulin adjustments:  1. Increase Lantus to 25 units QHS  2. Increase Novolog Meal Coverage to: Novolog 8 units TID with meals  (Please add the following Hold Parameters: Hold if pt eats <50% of meal, Hold if pt NPO)     --Will follow patient during hospitalization--  Wyn Quaker RN, MSN, CDE Diabetes Coordinator Inpatient Glycemic Control Team Team Pager: [phone] (8a-5p)

## 2019-01-31 NOTE — Progress Notes (Signed)
Patient ID: Meghan [name], female   DOB: [birth year], 68 y.o.   MRN: [id number]  [facility] PROGRESS NOTE  Meghan [name] [id number] DOB: 1951/03/24 DOA: 01/17/2019 PCP: [name], MD  HPI/Subjective: Patient with good urine output, feels ok Comfortable on oxygen via [place] at 2 liter Patients EGD canceled on 01/25/19  as patient was hypoxemic with minimal sedation.  Currently in the stepdown unit secondary to patient being on milrinone drip Decrease shortness of breath Still has swelling in the lower extremity secondary to fluid retention Objective: Vitals:   01/31/19 0800 01/31/19 0900  BP: 132/77 (!) 110/95  Pulse: 93   Resp:  (!) 31  Temp: 98.2 F (36.8 C)   SpO2: 97% 98%    Intake/Output Summary (Last 24 hours) at 01/31/2019 0940 Last data filed at 01/31/2019 0900 Gross per 24 hour  Intake 611.84 ml  Output 3275 ml  Net -2663.16 ml   Filed Weights   01/29/19 0500 01/30/19 0500 01/31/19 0500  Weight: 129.4 kg 129.3 kg 127.9 kg    ROS: Review of Systems  Constitutional: Negative.   HENT: Negative.   Eyes: Negative.   Respiratory: Positive for shortness of breath.   Cardiovascular: Positive for orthopnea and leg swelling.  Gastrointestinal: Negative.   Genitourinary: Negative.   Musculoskeletal: Negative.   Skin: Negative.   Neurological: Negative.   Endo/Heme/Allergies: Negative.   Psychiatric/Behavioral: Negative.     Exam: Physical Exam  Constitutional: She is oriented to person, place, and time.  HENT:  Nose: No mucosal edema.  Mouth/Throat: No oropharyngeal exudate or posterior oropharyngeal edema.  Eyes: Pupils are equal, round, and reactive to light. Conjunctivae, EOM and lids are normal.  Neck: No JVD present. Carotid bruit is not present. No edema present. No thyroid mass and no thyromegaly present.  Cardiovascular: S1 normal and S2 normal. Exam reveals no gallop.  No murmur heard. Pulses:      Dorsalis pedis pulses are 2+ on the right side  and 2+ on the left side.  Respiratory: No respiratory distress. She has decreased breath sounds in the right lower field and the left lower field. She has no wheezes. She has no rhonchi. She has rales in the right lower field and the left lower field.  GI: Soft. Bowel sounds are normal. There is no abdominal tenderness.  Anasarca  Musculoskeletal:     Right ankle: She exhibits swelling.     Left ankle: She exhibits swelling.  Lymphadenopathy:    She has no cervical adenopathy.  Neurological: She is alert and oriented to person, place, and time. No cranial nerve deficit.  Skin: Skin is warm. Nails show no clubbing.  Chronic lower extremity discoloration  Psychiatric: She has a normal mood and affect.      Data Reviewed: Basic Metabolic Panel: Recent Labs  Lab 01/27/19 0542 01/28/19 0523 01/29/19 0453 01/30/19 0536 01/31/19 0545  NA 136 132* 135 134* 134*  K 4.0 4.1 3.9 4.3 4.2  CL 96* 92* 94* 94* 93*  CO2 32 29 30 31 31   GLUCOSE 140* 235* 231* 225* 211*  BUN 56* 58* 58* 57* 57*  CREATININE 1.95* 2.20* 2.10* 2.01* 1.87*  CALCIUM 8.3* 8.4* 8.4* 8.5* 8.7*  MG  --  2.1 2.0 2.2  --    CBC: Recent Labs  Lab 01/26/19 0616 01/27/19 0542  01/28/19 0523 01/28/19 1423 01/29/19 0211 01/29/19 0453 01/29/19 1352  WBC 6.7 8.7  --  7.5  --   --  6.8  --  HGB 8.1* 7.7*   < > 8.8* 9.2* 8.3* 8.1* 8.2*  HCT 26.7* 25.8*   < > 28.2* 29.8* 26.6* 25.8* 26.2*  MCV 86.1 87.2  --  85.2  --   --  84.3  --   PLT 196 191  --  184  --   --  192  --    < > = values in this interval not displayed.   BNP (last 3 results) Recent Labs    12/13/18 1136 01/17/19 0011  BNP 1,269.0* 790.0*     CBG: Recent Labs  Lab 01/30/19 0732 01/30/19 1158 01/30/19 1626 01/30/19 2114 01/31/19 0732  GLUCAP 219* 227* 235* 321* 188*    No results found for this or any previous visit (from the past 240 hour(s)).    Scheduled Meds: . sodium chloride   Intravenous Once  . atorvastatin  40 mg Oral  Daily  . Chlorhexidine Gluconate Cloth  6 each Topical Q0600  . furosemide  80 mg Intravenous BID  . insulin aspart  0-5 Units Subcutaneous QHS  . insulin aspart  0-9 Units Subcutaneous TID WC  . insulin aspart  6 Units Subcutaneous TID WC  . insulin glargine  22 Units Subcutaneous QHS  . levothyroxine  50 mcg Oral QAC breakfast  . metoprolol tartrate  25 mg Oral BID  . multivitamin with minerals  1 tablet Oral Daily  . pantoprazole  40 mg Intravenous Q12H  . potassium chloride  20 mEq Oral BID  . predniSONE  20 mg Oral Q breakfast  . sodium chloride flush  3 mL Intravenous Q12H   Continuous Infusions: . sodium chloride 0 mL (01/17/19 1010)  . albumin human Stopped (01/30/19 1446)  . milrinone 0.25 mcg/kg/min (01/31/19 0717)    Assessment/Plan:  1. Acute on chronic systolic and diastolic congestive heart failure exacerbation., Improving slowly, currently 14 L has been diuresed   continue milrinone drip with Lasix 80 IV every 12 for diuresis, follow up cardiology recommendations.  We will check with nephrology regarding consideration for PICC line placement to measure CVP. 2. Hypoxia secondary to heart failure exacerbation.  Patient on 2 L of oxygen and normally she does not wear oxygen.  Wean down as tolerated 3. Cardiogenic shock.  Discontinued IV steroids, changed to p.o. prednisone.  Holding other antihypertensive medications. 4. GI bleed and melena resolved.  Vitamin K given to reverse Coumadin. Reconsulted GI, seen by Dr. Bonna Gains, scheduled for EGD on 01/25/2019 but the procedure was canceled as patient became hypoxemic significantly with minimal sedation.  No active bleeding ,hemoglobin 8.1 -8.0 -8.1, Coumadin resumed has GI and cardiology are agreeable to resume that patient on IV Protonix twice daily.  we will get tagged RBC scan if patient starts bleeding actively.  01/27/2019 hemoglobin trended down to 7.7, cardiology has recommended to stop Coumadin as the patient failed the  Coumadin challenge and also recommending to keep her hemoglobin greater than 8.  1 unit of blood transfusion ordered with IV Lasix prior to the transfusion.  01/28/2019-repeat hemoglobin is at 8.8, will closely monitor hemoglobin hematocrit twice daily and transfuse as needed.  01/29/2019 hemoglobin is at 8.1 5. Acute blood loss anemia.  Status post multiple transfusions.  GI is recommending bleeding scan if patient starts bleeding again.  GI signed off.  Currently no active bleed 6. Acute kidney injury on chronic kidney disease 3 secondary to cardiorenal syndrome.  Creatinine improving with diuresis and on milrinone drip.  Nephrology is following, agreeable to continue IV  Lasix and milrinone drip 7. Persistent atrial fibrillation status post cardioversion and atrial fibrillation.  Continue amiodarone.  Coumadin discontinued, as patient failed Coumadin challenge.  For frequent PVCs we will continue beta-blocker and cardiology notified.  Not a further candidate for cardioversion patient is not on anticoagulation. 8. type 2 diabetes mellitus on sliding scale insulin and long-acting insulin. 9. History of CVA -discontinue Coumadin in view of GI bleed and failed Coumadin challenge.   10. Weakness and ambulatory dysfunction, continue physical therapy 11. Urinary tract infection treated with 5 days of Rocephin.  12.  Head lice-permethrin treatment given on 01/25/2019, reassessment no lice infestation noticed by the RN  13.  Generalized weakness physical therapy consult-patient is almost bedbound needs assistance with transfers at her baseline  Code Status: Full code    Code Status Orders  (From admission, onward)         Start     Ordered   01/17/19 0302  Full code  Continuous     01/17/19 0306        Code Status History    Date Active Date Inactive Code Status Order ID Comments User Context   12/13/2018 1415 12/15/2018 1952 Full Code [id number]  Otila Back, MD ED   03/30/2017 2002 04/01/2017 1940  DNR [id number]  Demetrios Loll, MD Inpatient   03/12/2017 1236 03/12/2017 1720 Full Code [id number]  Wellington Hampshire, MD Inpatient   09/21/2016 2056 09/24/2016 1922 Full Code [id number]  Theodoro Grist, MD Inpatient   07/26/2016 1530 07/28/2016 1812 Full Code [id number]  Theodoro Grist, MD Inpatient   Advance Care Planning Activity     Disposition Plan: To be determined  Consultants:  Cardiology  Nephrology  Gastroenterology  Time spent: 35 minutes, case discussed with cardiology, nephrology and intensivist.  [facility]

## 2019-01-31 NOTE — Progress Notes (Signed)
Good day. Up in chair most of the morning.Did own bath. Diuresing well. No complaints

## 2019-01-31 NOTE — Progress Notes (Signed)
Daughter in and visited all morning. Dr. Rockey Situ in to see patient. Plan for Milrinone drip to continue until patient sheds more fluid.

## 2019-01-31 NOTE — Progress Notes (Signed)
[facility]  ROUNDING NOTE   Subjective:   milrinone gtt.   Sitting in chair  UOP 2750 (1550)  Objective:  Vital signs in last 24 hours:  Temp:  [97.8 F (36.6 C)-98.3 F (36.8 C)] 98.2 F (36.8 C) (10/23 0800) Pulse Rate:  [42-122] 93 (10/23 0800) Resp:  [14-31] 31 (10/23 0900) BP: (99-132)/(50-95) 110/95 (10/23 0900) SpO2:  [86 %-100 %] 98 % (10/23 0900) Weight:  [127.9 kg] 127.9 kg (10/23 0500)  Weight change: -1.4 kg Filed Weights   01/29/19 0500 01/30/19 0500 01/31/19 0500  Weight: 129.4 kg 129.3 kg 127.9 kg    Intake/Output: I/O last 3 completed shifts: In: 857.8 [P.O.:480; I.V.:348; IV Piggyback:29.8] Out: 3100 [Urine:3100]   Intake/Output this shift:  Total I/O In: -  Out: 525 [Urine:525]  Physical Exam: General: NAD,   Head: Normocephalic, atraumatic. Moist oral mucosal membranes  Eyes: Anicteric, PERRL  Neck: Supple, trachea midline  Lungs:  Crackles bilateral bases, 2 L [place] O2  Heart: Regular rate and rhythm  Abdomen:  Soft, nontender,   Extremities:  trace peripheral edema.  Neurologic: Nonfocal, moving all four extremities  Skin: No lesions        Basic Metabolic Panel: Recent Labs  Lab 01/27/19 0542 01/28/19 0523 01/29/19 0453 01/30/19 0536 01/31/19 0545  NA 136 132* 135 134* 134*  K 4.0 4.1 3.9 4.3 4.2  CL 96* 92* 94* 94* 93*  CO2 32 29 30 31 31   GLUCOSE 140* 235* 231* 225* 211*  BUN 56* 58* 58* 57* 57*  CREATININE 1.95* 2.20* 2.10* 2.01* 1.87*  CALCIUM 8.3* 8.4* 8.4* 8.5* 8.7*  MG  --  2.1 2.0 2.2  --     Liver Function Tests: Recent Labs  Lab 01/30/19 0536  ALBUMIN 3.1*   No results for input(s): LIPASE, AMYLASE in the last 168 hours. No results for input(s): AMMONIA in the last 168 hours.  CBC: Recent Labs  Lab 01/26/19 0616 01/27/19 0542  01/28/19 0523 01/28/19 1423 01/29/19 0211 01/29/19 0453 01/29/19 1352  WBC 6.7 8.7  --  7.5  --   --  6.8  --   HGB 8.1* 7.7*   < > 8.8* 9.2* 8.3* 8.1* 8.2*   HCT 26.7* 25.8*   < > 28.2* 29.8* 26.6* 25.8* 26.2*  MCV 86.1 87.2  --  85.2  --   --  84.3  --   PLT 196 191  --  184  --   --  192  --    < > = values in this interval not displayed.    Cardiac Enzymes: No results for input(s): CKTOTAL, CKMB, CKMBINDEX, TROPONINI in the last 168 hours.  BNP: Invalid input(s): POCBNP  CBG: Recent Labs  Lab 01/30/19 0732 01/30/19 1158 01/30/19 1626 01/30/19 2114 01/31/19 0732  GLUCAP 219* 227* 235* 321* 188*    Microbiology: Results for orders placed or performed during the hospital encounter of 01/17/19  SARS Coronavirus 2 by RT PCR (hospital order, performed in [facility] hospital lab) Nasopharyngeal Nasopharyngeal Swab     Status: None   Collection Time: 01/17/19  1:41 AM   Specimen: Nasopharyngeal Swab  Result Value Ref Range Status   SARS Coronavirus 2 NEGATIVE NEGATIVE Final    Comment: (NOTE) If result is NEGATIVE SARS-CoV-2 target nucleic acids are NOT DETECTED. The SARS-CoV-2 RNA is generally detectable in upper and lower  respiratory specimens during the acute phase of infection. The lowest  concentration of SARS-CoV-2 viral copies this assay can  detect is 250  copies / mL. A negative result does not preclude SARS-CoV-2 infection  and should not be used as the sole basis for treatment or other  patient management decisions.  A negative result may occur with  improper specimen collection / handling, submission of specimen other  than nasopharyngeal swab, presence of viral mutation(s) within the  areas targeted by this assay, and inadequate number of viral copies  (<250 copies / mL). A negative result must be combined with clinical  observations, patient history, and epidemiological information. If result is POSITIVE SARS-CoV-2 target nucleic acids are DETECTED. The SARS-CoV-2 RNA is generally detectable in upper and lower  respiratory specimens dur ing the acute phase of infection.  Positive  results are indicative of  active infection with SARS-CoV-2.  Clinical  correlation with patient history and other diagnostic information is  necessary to determine patient infection status.  Positive results do  not rule out bacterial infection or co-infection with other viruses. If result is PRESUMPTIVE POSTIVE SARS-CoV-2 nucleic acids MAY BE PRESENT.   A presumptive positive result was obtained on the submitted specimen  and confirmed on repeat testing.  While 2019 novel coronavirus  (SARS-CoV-2) nucleic acids may be present in the submitted sample  additional confirmatory testing may be necessary for epidemiological  and / or clinical management purposes  to differentiate between  SARS-CoV-2 and other Sarbecovirus currently known to infect humans.  If clinically indicated additional testing with an alternate test  methodology [phone]) is advised. The SARS-CoV-2 RNA is generally  detectable in upper and lower respiratory sp ecimens during the acute  phase of infection. The expected result is Negative. Fact Sheet for Patients:  StrictlyIdeas.no Fact Sheet for Healthcare Providers: BankingDealers.co.za This test is not yet approved or cleared by the [place] FDA and has been authorized for detection and/or diagnosis of SARS-CoV-2 by FDA under an Emergency Use Authorization (EUA).  This EUA will remain in effect (meaning this test can be used) for the duration of the COVID-19 declaration under Section 564(b)(1) of the Act, 21 U.S.C. section 360bbb-3(b)(1), unless the authorization is terminated or revoked sooner. Performed at [facility], [address] [place], [place] [address]   MRSA PCR Screening     Status: None   Collection Time: 01/17/19  4:44 AM   Specimen: Nasal Mucosa; Nasopharyngeal  Result Value Ref Range Status   MRSA by PCR NEGATIVE NEGATIVE Final    Comment:        The GeneXpert MRSA Assay (FDA approved for NASAL  specimens only), is one component of a comprehensive MRSA colonization surveillance program. It is not intended to diagnose MRSA infection nor to guide or monitor treatment for MRSA infections. Performed at [facility], [address] [place], [place] [address]   Urine Culture     Status: Abnormal   Collection Time: 01/17/19 11:52 AM   Specimen: Urine, Clean Catch  Result Value Ref Range Status   Specimen Description   Final    URINE, CLEAN CATCH Performed at [facility], [address] [place], [place] [address]    Special Requests   Final    Normal Performed at [facility], [place], [place] [address]    Culture >=100,000 COLONIES/mL ESCHERICHIA COLI (A)  Final   Report Status 01/19/2019 FINAL  Final   Organism ID, Bacteria ESCHERICHIA COLI (A)  Final      Susceptibility   Escherichia coli - MIC*    AMPICILLIN >=  32 RESISTANT Resistant     CEFAZOLIN <=4 SENSITIVE Sensitive     CEFTRIAXONE <=1 SENSITIVE Sensitive     CIPROFLOXACIN >=4 RESISTANT Resistant     GENTAMICIN <=1 SENSITIVE Sensitive     IMIPENEM <=0.25 SENSITIVE Sensitive     NITROFURANTOIN <=16 SENSITIVE Sensitive     TRIMETH/SULFA <=20 SENSITIVE Sensitive     AMPICILLIN/SULBACTAM 8 SENSITIVE Sensitive     PIP/TAZO <=4 SENSITIVE Sensitive     Extended ESBL NEGATIVE Sensitive     * >=100,000 COLONIES/mL ESCHERICHIA COLI    Coagulation Studies: Recent Labs    01/29/19 0453 01/30/19 0536 01/31/19 0545  LABPROT 16.2* 15.0 14.9  INR 1.3* 1.2 1.2    Urinalysis: No results for input(s): COLORURINE, LABSPEC, PHURINE, GLUCOSEU, HGBUR, BILIRUBINUR, KETONESUR, PROTEINUR, UROBILINOGEN, NITRITE, LEUKOCYTESUR in the last 72 hours.  Invalid input(s): APPERANCEUR    Imaging: No results found.   Medications:   . sodium chloride 0 mL (01/17/19 1010)  . albumin human Stopped (01/30/19 1446)  . milrinone 0.25 mcg/kg/min (01/31/19 0717)   . sodium chloride    Intravenous Once  . atorvastatin  40 mg Oral Daily  . Chlorhexidine Gluconate Cloth  6 each Topical Q0600  . furosemide  80 mg Intravenous BID  . insulin aspart  0-5 Units Subcutaneous QHS  . insulin aspart  0-9 Units Subcutaneous TID WC  . insulin aspart  6 Units Subcutaneous TID WC  . insulin glargine  22 Units Subcutaneous QHS  . levothyroxine  50 mcg Oral QAC breakfast  . metoprolol tartrate  25 mg Oral BID  . multivitamin with minerals  1 tablet Oral Daily  . pantoprazole  40 mg Intravenous Q12H  . potassium chloride  20 mEq Oral BID  . predniSONE  20 mg Oral Q breakfast  . sodium chloride flush  3 mL Intravenous Q12H   sodium chloride, acetaminophen, HYDROcodone-acetaminophen, ondansetron (ZOFRAN) IV, polyethylene glycol, sodium chloride flush  Assessment/ Plan:  Ms. Meghan [name] is a 68 y.o. white female with chronic systolic congestive heart failure, coronary disease, atrial fibrillation on anticoagulation, diabetes mellitus type II, anemia, carotid artery disease with history of left carotid endarterectomy, stroke, hypertension, chronic kidney disease stage III, was admitted on10/9/2020with shortness of breath worsening edema, acute kidney injury.  1. Acute kidney injury on Chronic kidney disease stage IIIa with proteinuria and hematuria Baseline creatinine of 1.15, GFR of 49 on 06/10/18.  Chronic kidney disease secondary to diabetic nephropathy Acute renal failure secondary to acute cardiorenal syndrome   No indication for dialysis  - Continue furosemide 80mg  IV q12 - IV albumin as needed.   2. Acute exacerbation of systolic congestive heart failure.  IV furosemide as above.  - Milirone as per cardiology  3. Hypertension: Changed carvedilol to metoprolol this admission.  Holding losartan due to renal failure.   4. Urinary tract infection: E. Coli. Completed course of antibiotics.   5. Hypokalemia  - potassium replaced.   6. GI bleed with acute blood loss anemia  and acute renal failure - holding warfarin. PRBC transfusion on 10/19 Hemoglobin stable at 8.2    LOS: 14 Carissa Musick 10/23/20209:48 AM

## 2019-01-31 NOTE — Consult Note (Signed)
PHARMACY CONSULT NOTE  Pharmacy Consult for Electrolyte Monitoring and Replacement   Recent Labs: Potassium (mmol/L)  Date Value  01/31/2019 4.2  07/10/2013 3.6   Magnesium (mg/dL)  Date Value  01/30/2019 2.2  07/10/2013 1.5 (L)   Calcium (mg/dL)  Date Value  01/31/2019 8.7 (L)   Calcium, Total (mg/dL)  Date Value  07/10/2013 8.1 (L)   Albumin (g/dL)  Date Value  01/30/2019 3.1 (L)  06/15/2017 3.5 (L)  07/08/2013 1.7 (L)   Phosphorus (mg/dL)  Date Value  01/21/2019 3.3   Sodium (mmol/L)  Date Value  01/31/2019 134 (L)  12/24/2018 135  07/10/2013 134 (L)   Corrected Ca: 9.74 mg/dL  Assessment: 68 y.o. female with medical problems of chronic systolic congestive heart failure, coronary disease, atrial fibrillation requiring anticoagulation, diabetes, anemia, carotid artery disease with history of left carotid endarterectomy, previous stroke, hypertension, chronic kidney disease, was admitted on10/9/2020with shortness of breath worsening edema, acute kidney injury. She was on furosemide 60 mg IV BID which was d/c on 10/19 and changed to torsemide 40 mg daily and she remains on oral KCl 20 mEq BID. SCr is trending down.   Torsemide 40mg  daily Dcd 10/19. Furosemide 80mg  BID ordered   Scr:  1.93>> 1.95 >> 2.20 >>2.1>>2.01>>1.87  Goal of Therapy:  Given cardiac history: Potassium 4.0 - 5.1 mmol/L Magnesium: 2.0 - 2.4 mg/dL Other electrolytes WNL  Plan:   Continue potassium 20 mEq PO BID  No additional electrolyte supplementation warranted at this time  Recheck electrolytes with am labs.  Sallye Lat, PharmD Candidate 01/31/2019 11:12 AM

## 2019-01-31 NOTE — Telephone Encounter (Signed)
No vm to offer Ed f/u apt with Dr. Bonna Gains

## 2019-01-31 NOTE — Progress Notes (Signed)
Progress Note  Patient Name: Meghan [name] Date of Encounter: 01/31/2019  Primary Cardiologist: Kathlyn Sacramento, MD  Subjective   Breathing improving.  In good spirits today.  Inpatient Medications    Scheduled Meds:  sodium chloride   Intravenous Once   atorvastatin  40 mg Oral Daily   Chlorhexidine Gluconate Cloth  6 each Topical Q0600   furosemide  80 mg Intravenous BID   insulin aspart  0-5 Units Subcutaneous QHS   insulin aspart  0-9 Units Subcutaneous TID WC   insulin aspart  6 Units Subcutaneous TID WC   insulin glargine  22 Units Subcutaneous QHS   levothyroxine  50 mcg Oral QAC breakfast   metoprolol tartrate  25 mg Oral BID   multivitamin with minerals  1 tablet Oral Daily   pantoprazole  40 mg Intravenous Q12H   potassium chloride  20 mEq Oral BID   predniSONE  20 mg Oral Q breakfast   sodium chloride flush  3 mL Intravenous Q12H   Continuous Infusions:  sodium chloride 0 mL (01/17/19 1010)   albumin human 12.5 g (01/31/19 1023)   milrinone 0.25 mcg/kg/min (01/31/19 0717)   PRN Meds: sodium chloride, acetaminophen, HYDROcodone-acetaminophen, ondansetron (ZOFRAN) IV, polyethylene glycol, sodium chloride flush   Vital Signs    Vitals:   01/31/19 0600 01/31/19 0700 01/31/19 0800 01/31/19 0900  BP: (!) 100/50 99/68 132/77 (!) 110/95  Pulse: (!) 122 (!) 116 93   Resp:    (!) 31  Temp:   98.2 F (36.8 C)   TempSrc:      SpO2: 96%  97% 98%  Weight:      Height:        Intake/Output Summary (Last 24 hours) at 01/31/2019 1151 Last data filed at 01/31/2019 0900 Gross per 24 hour  Intake 635.84 ml  Output 3275 ml  Net -2639.16 ml   Filed Weights   01/29/19 0500 01/30/19 0500 01/31/19 0500  Weight: 129.4 kg 129.3 kg 127.9 kg    Physical Exam   GEN: Well nourished, well developed, in no acute distress.  HEENT: Grossly normal.  Neck: Supple, obese - difficult to gauge JVP, no carotid bruits, or masses. Cardiac: IR, IR tachy, no  murmurs, rubs, or gallops. No clubbing, cyanosis. 2+ bilat LE edema extending up thighs and into bilat flanks.  Radials/DP/PT 1+ and equal bilaterally.  Respiratory:  Respirations regular and unlabored, bibasilar crackles. GI: Soft, nontender, nondistended, BS + x 4. MS: no deformity or atrophy. Skin: warm and dry, no rash. Neuro:  Strength and sensation are intact. Psych: AAOx3.  Normal affect.  Labs    Chemistry Recent Labs  Lab 01/29/19 0453 01/30/19 0536 01/31/19 0545  NA 135 134* 134*  K 3.9 4.3 4.2  CL 94* 94* 93*  CO2 30 31 31   GLUCOSE 231* 225* 211*  BUN 58* 57* 57*  CREATININE 2.10* 2.01* 1.87*  CALCIUM 8.4* 8.5* 8.7*  ALBUMIN  --  3.1*  --   GFRNONAA 23* 25* 27*  GFRAA 27* 28* 31*  ANIONGAP 11 9 10      Hematology Recent Labs  Lab 01/27/19 0542  01/28/19 0523  01/29/19 0211 01/29/19 0453 01/29/19 1352  WBC 8.7  --  7.5  --   --  6.8  --   RBC 2.96*  --  3.31*  --   --  3.06*  --   HGB 7.7*   < > 8.8*   < > 8.3* 8.1* 8.2*  HCT 25.8*   < >  28.2*   < > 26.6* 25.8* 26.2*  MCV 87.2  --  85.2  --   --  84.3  --   MCH 26.0  --  26.6  --   --  26.5  --   MCHC 29.8*  --  31.2  --   --  31.4  --   RDW 19.9*  --  19.5*  --   --  19.2*  --   PLT 191  --  184  --   --  192  --    < > = values in this interval not displayed.    Cardiac Enzymes  Recent Labs  Lab 01/17/19 0011  TROPONINIHS 13      Radiology    No results found.  Telemetry    Afib 90's to low 100's w/ intermittent 2:1 aflutter @ 114 - Personally Reviewed  Cardiac Studies   2D echo 12/14/2018: 1. The left ventricle has severely reduced systolic function, with an ejection fraction of 25-30%. The cavity size was mildly dilated. Left ventricular diastolic Doppler parameters are indeterminate. Left ventricular diffuse hypokinesis. 2. The right ventricle has mildly reduced systolic function. The cavity was moderately enlarged. There is no increase in right ventricular wall thickness. Right  ventricular systolic pressure is moderately elevated with an estimated pressure of 56.2  mmHg. 3. Left atrial size was severely dilated. 4. Right atrial size was moderately dilated. 5. Tricuspid valve regurgitation is moderate. 6. Rhythm is atrial fibrillation  Patient Profile     68 y.o. 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  Assessment & Plan    1.  Acute on chronic systolic congestive heart failure/ischemic cardiomyopathy: EF 25 to 30% by echo September 2020.  Admitted October 9 due to progressive dyspnea and volume overload despite adjustment of outpatient diuretics.  Creatinine 3.82 on admission October 9.  This initially improved with diuresis but then rose again to 2.2 on October 28.  Transferred to ICU on October 20 due to cardiorenal syndrome and ongoing low output heart failure requiring initiation of milrinone.  -1.9 L overnight and 23.6 L since admission.  Her weight is down 1.4 kg from yesterday.  Creatinine continues to improve at 1.87 this morning.  As noted yesterday, true dry weight is difficult to gauge as weights have varied from a low of 225 to a high of over 300 pounds in the past year, though she says that she generally feels pretty good around 250 lbs (currently 281 lbs).  She remains significantly volume overloaded on examination.  Continue IV Lasix and milrinone.  As prev noted, ideally would place PICC to eval CVP and Co-ox, esp as we start to consider weaning milrinone in a few days. Nephrology prev preferred to avoid due to AKI.  2.  Anemia/GI bleed: Reported bright red blood per rectum on admission.  H&H stable off of anticoagulation, which was discontinued after  rechallenge with subsequent drop in H&H in October 21.  3.  Persistent atrial fibrillation: Rate stable in the 90s on oral beta-blocker.  Coumadin held secondary to GI bleed and anemia.  Recently failed cardioversion on amiodarone.  Not currently a candidate for rhythm management as she is off of oral anticoagulation.  If rates  are difficult to control going forward and or persistent A. fib continues to worsen heart failure, we will need to strongly consider reintroduction of anticoagulation with antiarrhythmic therapy and/or EP referral for biventricular pacemaker and AV nodal ablation.  4.  Acute on chronic stage III kidney disease/cardiorenal syndrome: Creatinine improving on milrinone-1.87 this morning.  5.  Coronary artery disease: No chest pain. HsTroponin normal on admission.  Continue beta-blocker/statin.  Aspirin on hold secondary to anemia/GI bleed.  6.  Type 2 diabetes mellitus: Insulin per internal medicine.  Consider SGLT2 inhibitor as outpatient.  Signed, Murray Hodgkins, NP  01/31/2019, 11:51 AM    For questions or updates, please contact   Please consult [email or web address] for contact info under Cardiology/STEMI.

## 2019-02-01 ENCOUNTER — Inpatient Hospital Stay: Payer: Medicare Other

## 2019-02-01 DIAGNOSIS — I255 Ischemic cardiomyopathy: Secondary | ICD-10-CM | POA: Diagnosis not present

## 2019-02-01 DIAGNOSIS — I4819 Other persistent atrial fibrillation: Secondary | ICD-10-CM | POA: Diagnosis not present

## 2019-02-01 LAB — PROTIME-INR
INR: 1.1 (ref 0.8–1.2)
Prothrombin Time: 14.5 seconds (ref 11.4–15.2)

## 2019-02-01 LAB — BASIC METABOLIC PANEL
Anion gap: 9 (ref 5–15)
BUN: 61 mg/dL — ABNORMAL HIGH (ref 8–23)
CO2: 33 mmol/L — ABNORMAL HIGH (ref 22–32)
Calcium: 8.8 mg/dL — ABNORMAL LOW (ref 8.9–10.3)
Chloride: 92 mmol/L — ABNORMAL LOW (ref 98–111)
Creatinine, Ser: 2.03 mg/dL — ABNORMAL HIGH (ref 0.44–1.00)
GFR calc Af Amer: 28 mL/min — ABNORMAL LOW (ref >60)
GFR calc non Af Amer: 24 mL/min — ABNORMAL LOW (ref >60)
Glucose, Bld: 224 mg/dL — ABNORMAL HIGH (ref 70–99)
Potassium: 4.2 mmol/L (ref 3.5–5.1)
Sodium: 134 mmol/L — ABNORMAL LOW (ref 135–145)

## 2019-02-01 LAB — CBC WITH DIFFERENTIAL/PLATELET
Abs Immature Granulocytes: 0.08 10*3/uL — ABNORMAL HIGH (ref 0.00–0.07)
Basophils Absolute: 0 10*3/uL (ref 0.0–0.1)
Basophils Relative: 0 %
Eosinophils Absolute: 0.1 10*3/uL (ref 0.0–0.5)
Eosinophils Relative: 2 %
HCT: 26.8 % — ABNORMAL LOW (ref 36.0–46.0)
Hemoglobin: 8.2 g/dL — ABNORMAL LOW (ref 12.0–15.0)
Immature Granulocytes: 1 %
Lymphocytes Relative: 10 %
Lymphs Abs: 0.7 10*3/uL (ref 0.7–4.0)
MCH: 26.1 pg (ref 26.0–34.0)
MCHC: 30.6 g/dL (ref 30.0–36.0)
MCV: 85.4 fL (ref 80.0–100.0)
Monocytes Absolute: 0.9 10*3/uL (ref 0.1–1.0)
Monocytes Relative: 13 %
Neutro Abs: 5.4 10*3/uL (ref 1.7–7.7)
Neutrophils Relative %: 74 %
Platelets: 221 10*3/uL (ref 150–400)
RBC: 3.14 MIL/uL — ABNORMAL LOW (ref 3.87–5.11)
RDW: 19.2 % — ABNORMAL HIGH (ref 11.5–15.5)
WBC: 7.3 10*3/uL (ref 4.0–10.5)
nRBC: 0 % (ref 0.0–0.2)

## 2019-02-01 LAB — GLUCOSE, CAPILLARY
Glucose-Capillary: 199 mg/dL — ABNORMAL HIGH (ref 70–99)
Glucose-Capillary: 203 mg/dL — ABNORMAL HIGH (ref 70–99)
Glucose-Capillary: 324 mg/dL — ABNORMAL HIGH (ref 70–99)
Glucose-Capillary: 338 mg/dL — ABNORMAL HIGH (ref 70–99)
Glucose-Capillary: 344 mg/dL — ABNORMAL HIGH (ref 70–99)

## 2019-02-01 LAB — PHOSPHORUS: Phosphorus: 2.9 mg/dL (ref 2.5–4.6)

## 2019-02-01 LAB — MAGNESIUM: Magnesium: 2.2 mg/dL (ref 1.7–2.4)

## 2019-02-01 LAB — BRAIN NATRIURETIC PEPTIDE: B Natriuretic Peptide: 563 pg/mL — ABNORMAL HIGH (ref 0.0–100.0)

## 2019-02-01 NOTE — Progress Notes (Signed)
[place] [facility] [place] NAME: Meghan [name]    MR#:  [id number]  DATE OF BIRTH:  Jul 30, 1950  SUBJECTIVE:  patient seen in the ICU. Daughter at bedside. Overall having a better day. Continued on IV milrinone gtt  REVIEW OF SYSTEMS:   Review of Systems  Constitutional: Negative for chills, fever and weight loss.  HENT: Negative for ear discharge, ear pain and nosebleeds.   Eyes: Negative for blurred vision, pain and discharge.  Respiratory: Positive for shortness of breath. Negative for sputum production, wheezing and stridor.   Cardiovascular: Negative for chest pain, palpitations, orthopnea and PND.  Gastrointestinal: Negative for abdominal pain, diarrhea, nausea and vomiting.  Genitourinary: Negative for frequency and urgency.  Musculoskeletal: Negative for back pain and joint pain.  Neurological: Negative for sensory change, speech change, focal weakness and weakness.  Psychiatric/Behavioral: Negative for depression and hallucinations. The patient is not nervous/anxious.    Tolerating Diet:yes Tolerating PT:   DRUG ALLERGIES:   Allergies  Allergen Reactions  . Spironolactone     Hyperkalemia     VITALS:  Blood pressure (!) 109/96, pulse (!) 126, temperature 98 F (36.7 C), temperature source Oral, resp. rate 17, height 5\' 7"  (1.702 m), weight 129.3 kg, SpO2 92 %.  PHYSICAL EXAMINATION:   Physical Exam  GENERAL:  68 y.o.-year-old patient lying in the bed with no acute distress. obese EYES: Pupils equal, round, reactive to light and accommodation. No scleral icterus. Extraocular muscles intact.  HEENT: Head atraumatic, normocephalic. Oropharynx and nasopharynx clear.  NECK:  Supple, no jugular venous distention. No thyroid enlargement, no tenderness.  LUNGS: Normal breath sounds bilaterally, no wheezing, rales, rhonchi. No use of accessory muscles of respiration.  CARDIOVASCULAR: S1, S2 normal. No murmurs, rubs, or gallops.   ABDOMEN: Soft, nontender, nondistended. Bowel sounds present. No organomegaly or mass.  EXTREMITIES: No cyanosis, clubbing o ++ edema b/l.    NEUROLOGIC: Cranial nerves II through XII are intact. No focal Motor or sensory deficits b/l.   PSYCHIATRIC:  patient is alert and oriented x 3.  SKIN: No obvious rash, lesion, or ulcer.   LABORATORY PANEL:  CBC Recent Labs  Lab 02/01/19 0425  WBC 7.3  HGB 8.2*  HCT 26.8*  PLT 221    Chemistries  Recent Labs  Lab 02/01/19 0425  NA 134*  K 4.2  CL 92*  CO2 33*  GLUCOSE 224*  BUN 61*  CREATININE 2.03*  CALCIUM 8.8*  MG 2.2   Cardiac Enzymes No results for input(s): TROPONINI in the last 168 hours. RADIOLOGY:  Dg Chest Port 1 View  Result Date: 02/01/2019 CLINICAL DATA:  Pulmonary edema EXAM: PORTABLE CHEST 1 VIEW COMPARISON:  01/25/2019 FINDINGS: Gross cardiomegaly. Mild, diffuse interstitial pulmonary opacity and pulmonary vascular prominence, not significantly changed compared to prior examination. The visualized skeletal structures are unremarkable. IMPRESSION: 1. Mild, diffuse interstitial pulmonary opacity and pulmonary vascular prominence, not significantly changed compared to prior examination. Findings are most consistent with edema. No new airspace opacity. 2.  Gross cardiomegaly. Electronically Signed   By: Eddie Candle M.D.   On: 02/01/2019 14:03   ASSESSMENT AND PLAN:  68 y.o. 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  1.  Acute on chronic systolic and diastolic congestive heart with Cardiogenic shock  failure exacerbation., Improving slowly, currently 12 L has been diuresed -continue milrinone drip with Lasix 80 IV every 12  for diuresis, follow up cardiology recommendations.    2. Hypoxia secondary to heart failure exacerbation.  Patient on 2 L of oxygen and normally she does not wear oxygen.  Wean down as tolerated  3. GI bleed and melena resolved.  Vitamin K given to reverse Coumadin. Reconsulted GI, seen by Dr. Bonna Gains, scheduled for EGD on 01/25/2019 but the procedure was canceled as patient became hypoxemic significantly with minimal sedation.  - No active bleeding ,hemoglobin 8.1 -8.0 -8.1  4. Acute blood loss anemia.  Status post multiple transfusions.  GI is recommending bleeding scan if patient starts bleeding again.  GI signed off.  Currently no active bleed  5. Acute kidney injury on chronic kidney disease 3 secondary to cardiorenal syndrome.  Creatinine improving with diuresis and on milrinone drip.  Nephrology is following, agreeable to continue IV Lasix and milrinone drip  6. Persistent atrial fibrillation status post cardioversion and atrial fibrillation.  Continue amiodarone.  Coumadin discontinued, as patient failed Coumadin challenge.  For frequent PVCs we will continue beta-blocker and cardiology notified.  Not a further candidate for cardioversion patient is not on anticoagulation--will need to be addressed if pt can be on some form of anticoagualiton  7. type 2 diabetes mellitus on sliding scale insulin and long-acting insulin.  9.History of CVA -discontinue Coumadin in view of GI bleed  10.Urinary tract infection treated with 5 days of Rocephin.   11.  Head lice-permethrin treatment given on 01/25/2019, reassessment no lice infestation noticed by the RN   12.  Generalized weakness physical therapy consult-patient is almost bedbound needs assistance with transfers at her baseline   Case discussed with Care Management/Social Worker. Management plans discussed with the patient, family and they are in agreement.  CODE STATUS: full  DVT Prophylaxis: SCD  TOTAL TIME TAKING CARE OF THIS  PATIENT: *30* minutes.  >50% time spent on counselling and coordination of care  POSSIBLE D/C IN *few* DAYS, DEPENDING ON CLINICAL CONDITION.  Note: This dictation was prepared with Dragon dictation along with smaller phrase technology. Any transcriptional errors that result from this process are unintentional.  Fritzi Mandes M.D on 02/01/2019 at 2:22 PM  Between 7am to 6pm - Pager - [phone]  After 6pm go to [email or web address] - password [email or web address] [facility]  Office  [phone]  CC: Primary care physician; [name], MDPatient ID: [name], female   DOB: 20-Dec-1950, 68 y.o.   MRN: [id number]

## 2019-02-01 NOTE — Consult Note (Signed)
PHARMACY CONSULT NOTE  Pharmacy Consult for Electrolyte Monitoring and Replacement   Recent Labs: Potassium (mmol/L)  Date Value  02/01/2019 4.2  07/10/2013 3.6   Magnesium (mg/dL)  Date Value  02/01/2019 2.2  07/10/2013 1.5 (L)   Calcium (mg/dL)  Date Value  02/01/2019 8.8 (L)   Calcium, Total (mg/dL)  Date Value  07/10/2013 8.1 (L)   Albumin (g/dL)  Date Value  01/30/2019 3.1 (L)  06/15/2017 3.5 (L)  07/08/2013 1.7 (L)   Phosphorus (mg/dL)  Date Value  02/01/2019 2.9   Sodium (mmol/L)  Date Value  02/01/2019 134 (L)  12/24/2018 135  07/10/2013 134 (L)   Corrected Ca: 9.74 mg/dL  Assessment: 68 y.o. female with medical problems of chronic systolic congestive heart failure, coronary disease, atrial fibrillation requiring anticoagulation, diabetes, anemia, carotid artery disease with history of left carotid endarterectomy, previous stroke, hypertension, chronic kidney disease, was admitted on10/9/2020with shortness of breath worsening edema, acute kidney injury.   KCl 20 mEq BID, Furosemide 80mg  BID   Goal of Therapy:  Given cardiac history: Potassium 4.0 - 5.1 mmol/L Magnesium: 2.0 - 2.4 mg/dL Other electrolytes WNL  Plan:   Continue potassium 20 mEq PO BID  No additional electrolyte supplementation warranted at this time  Recheck electrolytes with am labs.  Olivia Canter, [facility] 02/01/2019 10:21 AM

## 2019-02-01 NOTE — Progress Notes (Signed)
Progress Note  Patient Name: Meghan [name] Date of Encounter: 02/01/2019  Primary Cardiologist: Kathlyn Sacramento, MD   Subjective   She feels much better today.  Reports that she does not yet feel at baseline; however, her breathing is improved.  Feels like she can lay flat in the bed.  Reports improving lower extremity edema and abdominal distention.  No chest pain, palpitations, or racing heart rate.  Inpatient Medications    Scheduled Meds: . sodium chloride   Intravenous Once  . atorvastatin  40 mg Oral Daily  . Chlorhexidine Gluconate Cloth  6 each Topical Q0600  . furosemide  80 mg Intravenous BID  . insulin aspart  0-5 Units Subcutaneous QHS  . insulin aspart  0-9 Units Subcutaneous TID WC  . insulin aspart  6 Units Subcutaneous TID WC  . insulin glargine  22 Units Subcutaneous QHS  . levothyroxine  50 mcg Oral QAC breakfast  . metoprolol tartrate  25 mg Oral BID  . multivitamin with minerals  1 tablet Oral Daily  . pantoprazole  40 mg Intravenous Q12H  . potassium chloride  20 mEq Oral BID  . predniSONE  20 mg Oral Q breakfast  . sodium chloride flush  3 mL Intravenous Q12H   Continuous Infusions: . sodium chloride 0 mL (01/17/19 1010)  . albumin human 12.5 g (02/01/19 0900)  . milrinone 0.25 mcg/kg/min (02/01/19 0700)   PRN Meds: sodium chloride, acetaminophen, HYDROcodone-acetaminophen, ondansetron (ZOFRAN) IV, polyethylene glycol, sodium chloride flush   Vital Signs    Vitals:   02/01/19 0300 02/01/19 0402 02/01/19 0800 02/01/19 0900  BP: 100/67  (!) 109/96   Pulse: (!) 122  (!) 126 (!) 126  Resp: 16  13 17   Temp:   98 F (36.7 C)   TempSrc:   Oral   SpO2: 92%  95% 92%  Weight:  129.3 kg    Height:        Intake/Output Summary (Last 24 hours) at 02/01/2019 1146 Last data filed at 02/01/2019 0908 Gross per 24 hour  Intake 293.22 ml  Output 2625 ml  Net -2331.78 ml   Last 3 Weights 02/01/2019 01/31/2019 01/30/2019  Weight (lbs) 285 lb 0.9 oz 281  lb 15.5 oz 285 lb 0.9 oz  Weight (kg) 129.3 kg 127.9 kg 129.3 kg      Telemetry    Afib/flutter with PVCs 80-120s.  - Personally Reviewed  ECG    No new tracings- Personally Reviewed  Physical Exam   GEN: Sitting in the recliner.  Neck: JVD difficult to assess due to body habitus, JVP still mildly elevated ~11cm Cardiac:IRIR, tachycardic 1/6 systolic murmur; no rubs or gallops.  Respiratory: Coarse breath sounds bilaterally, bilateral wheezing  GI: obese, mildly firm, non-tender  MS: 1+ b/l lower extremity edema; No deformity. Neuro:  Nonfocal  Psych: Normal affect  Labs    High Sensitivity Troponin:   Recent Labs  Lab 01/17/19 0011  TROPONINIHS 13      Cardiac EnzymesNo results for input(s): TROPONINI in the last 168 hours. No results for input(s): TROPIPOC in the last 168 hours.   Chemistry Recent Labs  Lab 01/30/19 0536 01/31/19 0545 02/01/19 0425  NA 134* 134* 134*  K 4.3 4.2 4.2  CL 94* 93* 92*  CO2 31 31 33*  GLUCOSE 225* 211* 224*  BUN 57* 57* 61*  CREATININE 2.01* 1.87* 2.03*  CALCIUM 8.5* 8.7* 8.8*  ALBUMIN 3.1*  --   --   GFRNONAA 25* 27* 24*  GFRAA 28* 31* 28*  ANIONGAP 9 10 9      Hematology Recent Labs  Lab 01/28/19 0523  01/29/19 0453 01/29/19 1352 02/01/19 0425  WBC 7.5  --  6.8  --  7.3  RBC 3.31*  --  3.06*  --  3.14*  HGB 8.8*   < > 8.1* 8.2* 8.2*  HCT 28.2*   < > 25.8* 26.2* 26.8*  MCV 85.2  --  84.3  --  85.4  MCH 26.6  --  26.5  --  26.1  MCHC 31.2  --  31.4  --  30.6  RDW 19.5*  --  19.2*  --  19.2*  PLT 184  --  192  --  221   < > = values in this interval not displayed.    BNPNo results for input(s): BNP, PROBNP in the last 168 hours.   DDimer No results for input(s): DDIMER in the last 168 hours.   Radiology    No results found.  Cardiac Studies   2D echo 12/14/2018: 1. The left ventricle has severely reduced systolic function, with an ejection fraction of 25-30%. The cavity size was mildly dilated. Left  ventricular diastolic Doppler parameters are indeterminate. Left ventricular diffuse hypokinesis. 2. The right ventricle has mildly reduced systolic function. The cavity was moderately enlarged. There is no increase in right ventricular wall thickness. Right ventricular systolic pressure is moderately elevated with an estimated pressure of 56.2  mmHg. 3. Left atrial size was severely dilated. 4. Right atrial size was moderately dilated. 5. Tricuspid valve regurgitation is moderate. 6. Rhythm is atrial fibrillation  Patient Profile     68 y.o. female with a history of CAD, HFrEF secondary to ICM, prior hyperkalemia with spironolactone in the setting of renal failure, persistent atrial fibrillation s/p recent DCCV 01/03/2019 that was briefly successful on amiodarone and Coumadin, CVA, anemia, carotid artery disease s/p left-sided CEA, DM, hypertension, hyperlipidemia, and is being seen today for the evaluation of AOC HFrEF.  Assessment & Plan    Acute on chronic HFrEF secondary to ICM with likely cardiorenal syndrome --Improving shortness of breath.  Still feels volume up. --12/2018 Echo above with EF 25-30%. --Diuresis has been complicated by renal function.  Transferred to the ICU and started on milrinone drip with aggressive diuresis and improved urine output. --Continue to monitor I/O, daily weights.  Goal urine output of -2 L daily.  -2.2L over the last 24 hours. -24 L for the admission. Current wt 129.3kg. Baseline weight (per office notes) at 131.7kg and most recent admission weights stable at 133.4kg. Previous 2019 were recorded to be closer to 112kg of note. Continue daily BMET. Cr 1.87  2.03 with BUN 57  61. Electrolytes at goal --Continue IV furosemide 80 mg twice daily with milrinone drip. Still volume overloaded on exam and producing a significant amount of output. Renal function climbed overnight yet not elevated significantly from that of 10/22, though BUN climbing. Continue to  monitor closely and consider recheck of Cr later tonight. Will also reorder BNP, given volume status difficult to gauge 2/2 obesity and given current anemia and hypoalbuminemia. Could also consider placing PICC to evaluate CVP and Co-ox as would not recommend long term administration of milrinone and should wean off drip when able.  --Continue metoprolol tartrate 25 mg twice daily with plan to consolidate to Toprol-XL prior to discharge. AKI /CKD precludes ACE/ARB/Entreso/Spironolactone. Consider addition of long acting nitrate and hydralazine at discharge /follow-up as an outpatient if BP tolerates.  Persistent atrial flutter/atrial fibrillation with RVR --Asymptomatic.S/p recent DCCV 01/03/2019, briefly held SR on amiodarone after cardioversion. Continues to be in atrial fibrillation with PVCs. --Continue beta-blocker / lopressor 25mg  BID for rate control only.  Unable to use digoxin due to current renal function.  Avoid CCB with low EF. Avoid IV amiodarone as she is not therapeutically anticoagulated with risk of thrombosis still associated with pharmacologic conversion.  Discontinued oral anticoagulation (challenged by GI) due to anemia / GIB. H&H currently stable. Daily CBC. Would recommend further GIB workup before restart of anticoagulation, and until that time, current risks of anticoagulation outweigh those of benefits. No current plan for DCCV/TEE as not therapeutically anticoagulated and asymptomatic.  Could consider EP referral at discharge if still not able to reintroduce anticoagulation.   CAD --Known severe CAD by catheterization 2018.  --Continue medical management as comorbid conditions allow.  Continue BB and secondary prevention with atorvastatin.  Aspirin has been on hold in the setting of GIB.  GIB/blood loss anemia --Anticoagulation discontinued and patient pending transfusion after EGD aborted due to hypoxia. S/p transfusion. Further workup recommended as above. Continue PPI.   Hypoalbuminemia --Continue albumin. Consider as contributing to third spacing.   AOCKDIII --Daily BMET. Slight Cr bump overnight. Continue to monitor closely. Nephrology following.  No current indication for dialysis.  Likely AKI secondary to acute cardiorenal syndrome and CKD 2/2 DM2.  Milrinone started - continue with diuresis. Avoid nephrotoxins. Renally dose medications, if applicable. Avoid contrast.   For questions or updates, please contact [place] Please consult [email or web address] for contact info under        Signed, Arvil Chaco, PA-C  02/01/2019, 11:46 AM

## 2019-02-01 NOTE — Progress Notes (Signed)
Meghan [name]  MRN: [id number]  DOB/AGE: 1950-08-24 68 y.o.  Primary Care Physician:Jones, Iven Finn, MD  Admit date: 01/17/2019  Chief Complaint:  Chief Complaint  Patient presents with  . Shortness of Breath    S-Pt presented on  01/17/2019 with  Chief Complaint  Patient presents with  . Shortness of Breath  .    Pt today feels better.  Patient offers no new complaints    Patient major complaint in today's visit was not she can lay flat when  she sleeps  Meds  . sodium chloride   Intravenous Once  . atorvastatin  40 mg Oral Daily  . Chlorhexidine Gluconate Cloth  6 each Topical Q0600  . furosemide  80 mg Intravenous BID  . insulin aspart  0-5 Units Subcutaneous QHS  . insulin aspart  0-9 Units Subcutaneous TID WC  . insulin aspart  6 Units Subcutaneous TID WC  . insulin glargine  22 Units Subcutaneous QHS  . levothyroxine  50 mcg Oral QAC breakfast  . metoprolol tartrate  25 mg Oral BID  . multivitamin with minerals  1 tablet Oral Daily  . pantoprazole  40 mg Intravenous Q12H  . potassium chloride  20 mEq Oral BID  . predniSONE  20 mg Oral Q breakfast  . sodium chloride flush  3 mL Intravenous Q12H         [id number] from the symptoms mentioned above,there are no other symptoms referable to all systems reviewed.  Physical Exam: Vital signs in last 24 hours: Temp:  [97.8 F (36.6 C)-98.4 F (36.9 C)] 98 F (36.7 C) (10/24 0800) Pulse Rate:  [53-126] 126 (10/24 0900) Resp:  [13-17] 17 (10/24 0900) BP: (100-142)/(59-100) 109/96 (10/24 0800) SpO2:  [92 %-97 %] 92 % (10/24 0900) Weight:  [129.3 kg] 129.3 kg (10/24 0402) Weight change: 1.4 kg Last BM Date: 01/31/19(per previous documentation)  Intake/Output from previous day: 10/23 0701 - 10/24 0700 In: 527.2 [P.O.:240; I.V.:287.2] Out: 2750 [Urine:2750] Total I/O In: 6 [I.V.:6] Out: 400 [Urine:400]   Physical Exam: General- pt is awake,alert, oriented to time place and person Resp- No acute REsp  distress,  Rhonchi present CVS- S1S2 regular in rate and rhythm GIT- BS+, soft, NT, ND EXT- 2+ LE Edema, no cyanosis   Lab Results: CBC Recent Labs    01/29/19 1352 02/01/19 0425  WBC  --  7.3  HGB 8.2* 8.2*  HCT 26.2* 26.8*  PLT  --  221    BMET Recent Labs    01/31/19 0545 02/01/19 0425  NA 134* 134*  K 4.2 4.2  CL 93* 92*  CO2 31 33*  GLUCOSE 211* 224*  BUN 57* 61*  CREATININE 1.87* 2.03*  CALCIUM 8.7* 8.8*    Creat trend 2020  3.8==> 1.8--2.0           1.7--2.4( September admission) 2019  1.2--1.9 2018  1.2--1.8    MICRO No results found for this or any previous visit (from the past 240 hour(s)).    Lab Results  Component Value Date   CALCIUM 8.8 (L) 02/01/2019   PHOS 2.9 02/01/2019       Impression:  Meghan [name] is a 68 y.o. white female with chronic systolic congestive heart failure, coronary disease, atrial fibrillation on anticoagulation, diabetes mellitus type II, anemia, carotid artery disease with history of left carotid endarterectomy, stroke, hypertension, chronic kidney disease stage III, was admitted on10/9/2020with shortness of breath worsening edema, acute kidney injury.   1)Renal  AKI secondary to  cardiorenal syndrome                AKI on CKD               CKD stage 3B.               CKD since 2018               CKD secondary to diabetes mellitus                Progression of CKD marked with multiple acute kidney injury episodes                Proteinuria  Present  2)HTN  Medication- On Diuretics On Beta blockers    3)Anemia of acute blood loss  HGb stable   4)CKD Mineral-Bone Disorder Phosphorus at goal.   5) Acute exacerbation of systolic congestive heart failure.  IV furosemide as above.  - Milirone as per cardiology  6) Hyponatremia      Hypervolemic     Stable  7)Acid base Co2 at goal     Plan:   We will continue current treatment We will continue current IV diuresis May need to reduce  the diuretic dose  in case BUN/creatinine rises more in the morning    Daunte Oestreich s [facility] 02/01/2019, 11:30 AM

## 2019-02-01 NOTE — Progress Notes (Signed)
CRITICAL CARE PROGRESS NOTE    Name: Meghan [name] MRN: [id number] DOB: [birth year]     LOS: 53   SUBJECTIVE FINDINGS & SIGNIFICANT EVENTS   Patient description:  68 yo F mobidly obese, with HFrEF, Valvular AF, CKD3, chronic anemia, CVA, HTN, DM, CAD s/p L CEA, dyslipidemia, hx of LGIB  Lines / Drains: PIV on primacor ionotrope drip  Cultures / Sepsis markers: Urine culture with >100k ecoli  Antibiotics: none   Protocols / Consultants: Renal Cardio CC/pulm    PAST MEDICAL HISTORY   Past Medical History:  Diagnosis Date  . Carotid arterial disease ([place])    a. 02/2012 s/p L CEA.  . CKD stage G3b/A1, GFR 30-44 and albumin creatinine ratio <30 mg/g   . Coronary artery disease    a. 02/2012 [facility] MV: Anterior, anteroseptal, septal, apical infarct with mild peri-infarct ischemia, EF 41%- medically managed due to anemia; b. 03/2017 Cath: LM nl, LAD 99ost/p/m - faint L->L collaterals, RI 20ost, LCX 81m, RCA 20p, 23m-->Med Rx for chronic subtotal LAD dzs. Avoid R Radial access in future.  . 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 Hyperlipidemia   . Hypertension   . Ischemic cardiomyopathy    a. 06/2013 Echo: EF 35-40%, glob HK, mildly dil LA, mild LVH, mild TR, mild to mod MR; b. 07/2016 Echo: EF 25-30%, sev mid-apicalanteroseptal, ant, apical HK, mild MR, sev dil LA, mod dil RA, mod TR, PASP 72mmHg; c. 11/2016 Echo: EF 20-25%, antsept DK, Gr2 DD, mild MR, mod dil LA/RV/RA, mod red RV fxn, mod-sev TR, PASP 40-57mmHg.  . Morbid obesity ([place])   . Normocytic anemia   . Persistent atrial fibrillation (HCC)    a. CHA2DS2VASc = 7-->coumadin; maintaining sinus on amio.  . Stroke [facility]) 2013     SURGICAL HISTORY   Past Surgical History:  Procedure Laterality Date  . AMPUTATION TOE Left  09/22/2016   Procedure: AMPUTATION TOE;  Surgeon: Albertine Patricia, DPM;  Location: [facility] ORS;  Service: Podiatry;  Laterality: Left;  . CARDIOVERSION N/A 07/28/2016   Procedure: CARDIOVERSION;  Surgeon: Wellington Hampshire, MD;  Location: [facility] ORS;  Service: Cardiovascular;  Laterality: N/A;  . CARDIOVERSION N/A 08/07/2016   Procedure: CARDIOVERSION;  Surgeon: Wellington Hampshire, MD;  Location: [facility] ORS;  Service: Cardiovascular;  Laterality: N/A;  . CARDIOVERSION N/A 01/03/2019   Procedure: CARDIOVERSION;  Surgeon: Minna Merritts, MD;  Location: [facility] ORS;  Service: Cardiovascular;  Laterality: N/A;  . CAROTID ENDARTERECTOMY     [facility]; Dr. Lucky Cowboy  . CHOLECYSTECTOMY    . ESOPHAGOGASTRODUODENOSCOPY (EGD) WITH PROPOFOL N/A 01/25/2019   Procedure: ESOPHAGOGASTRODUODENOSCOPY (EGD) WITH PROPOFOL;  Surgeon: Virgel Manifold, MD;  Location: [facility] ENDOSCOPY;  Service: Endoscopy;  Laterality: N/A;  . foot infection Right   . LEG SURGERY     right;infection  . RIGHT/LEFT HEART CATH AND CORONARY ANGIOGRAPHY Bilateral 03/12/2017   Procedure: RIGHT/LEFT HEART CATH AND CORONARY ANGIOGRAPHY;  Surgeon: Wellington Hampshire, MD;  Location: [facility];  Service: Cardiovascular;  Laterality: Bilateral;  . TONSILLECTOMY       FAMILY HISTORY   Family History  Problem Relation Age of Onset  . Heart disease Mother   . Diabetes Mother   . Heart disease Father   . Hypertension Father   . Diabetes Maternal Grandmother      SOCIAL HISTORY   Social History   Tobacco Use  . Smoking status: Former Smoker    Packs/day: 0.50  Years: 5.00    Pack years: 2.50    Types: Cigarettes  . Smokeless tobacco: Never Used  Substance Use Topics  . Alcohol use: No  . Drug use: No     MEDICATIONS   Current Medication:  Current Facility-Administered Medications:  .  0.9 %  sodium chloride infusion (Manually program via Guardrails IV Fluids), , Intravenous, Once, Nicholes Mango, MD, Stopped at 01/28/19 [id number] .   0.9 %  sodium chloride infusion, 250 mL, Intravenous, PRN, Lang Snow, NP, Last Rate: 0 mL/hr at 01/17/19 1010 .  acetaminophen (TYLENOL) tablet 650 mg, 650 mg, Oral, Q4H PRN, Lang Snow, NP, 650 mg at 01/24/19 1043 .  albumin human 25 % solution 12.5 g, 12.5 g, Intravenous, Daily, Candiss Norse, [place], MD, Last Rate: 60 mL/hr at 02/01/19 0900, 12.5 g at 02/01/19 0900 .  atorvastatin (LIPITOR) tablet 40 mg, 40 mg, Oral, Daily, Ouma, Bing Neighbors, NP, 40 mg at 01/31/19 1706 .  Chlorhexidine Gluconate Cloth 2 % PADS 6 each, 6 each, Topical, Q0600, Kasa, Kurian, MD, 6 each at 01/31/19 1037 .  furosemide (LASIX) injection 80 mg, 80 mg, Intravenous, BID, Kathlyn Sacramento A, MD, 80 mg at 02/01/19 0956 .  HYDROcodone-acetaminophen (NORCO/VICODIN) 5-325 MG per tablet 1-2 tablet, 1-2 tablet, Oral, Q6H PRN, Flora Lipps, MD, 2 tablet at 01/29/19 1321 .  insulin aspart (novoLOG) injection 0-5 Units, 0-5 Units, Subcutaneous, Corwin Levins, MD, 2 Units at 01/31/19 2138 .  insulin aspart (novoLOG) injection 0-9 Units, 0-9 Units, Subcutaneous, TID WC, Lance Coon, MD, 2 Units at 02/01/19 [phone] .  insulin aspart (novoLOG) injection 6 Units, 6 Units, Subcutaneous, TID WC, Gouru, Aruna, MD, 6 Units at 02/01/19 0752 .  insulin glargine (LANTUS) injection 22 Units, 22 Units, Subcutaneous, QHS, Gouru, Aruna, MD, 22 Units at 01/31/19 2144 .  levothyroxine (SYNTHROID) tablet 50 mcg, 50 mcg, Oral, QAC breakfast, Lang Snow, NP, 50 mcg at 02/01/19 0746 .  metoprolol tartrate (LOPRESSOR) tablet 25 mg, 25 mg, Oral, BID, Gouru, Aruna, MD, 25 mg at 01/31/19 2139 .  milrinone (PRIMACOR) 20 MG/100 ML (0.2 mg/mL) infusion, 0.25 mcg/kg/min, Intravenous, Continuous, Gouru, Aruna, MD, Last Rate: 10.07 mL/hr at 02/01/19 0700, 0.25 mcg/kg/min at 02/01/19 0700 .  multivitamin with minerals tablet 1 tablet, 1 tablet, Oral, Daily, Gouru, Aruna, MD, 1 tablet at 02/01/19 0902 .  ondansetron (ZOFRAN)  injection 4 mg, 4 mg, Intravenous, Q6H PRN, Ouma, Bing Neighbors, NP .  pantoprazole (PROTONIX) injection 40 mg, 40 mg, Intravenous, Q12H, Darel Hong D, NP, 40 mg at 02/01/19 0908 .  polyethylene glycol (MIRALAX / GLYCOLAX) packet 17 g, 17 g, Oral, Daily PRN, Lin Landsman, MD, 17 g at 01/28/19 0429 .  potassium chloride SA (KLOR-CON) CR tablet 20 mEq, 20 mEq, Oral, BID, Leslye Peer, Richard, MD, 20 mEq at 02/01/19 0902 .  predniSONE (DELTASONE) tablet 20 mg, 20 mg, Oral, Q breakfast, Gouru, Aruna, MD, 20 mg at 02/01/19 0746 .  sodium chloride flush (NS) 0.9 % injection 3 mL, 3 mL, Intravenous, Q12H, Ouma, Bing Neighbors, NP, 3 mL at 02/01/19 0908 .  sodium chloride flush (NS) 0.9 % injection 3 mL, 3 mL, Intravenous, PRN, Ouma, Bing Neighbors, NP    ALLERGIES   Spironolactone    REVIEW OF SYSTEMS     10 point ROS negative except as per subj findings  PHYSICAL EXAMINATION   Vital Signs: Temp:  [97.8 F (36.6 C)-98.4 F (36.9 C)] 98 F (36.7 C) (10/24 0800) Pulse Rate:  [53-126] 126 (  10/24 0900) Resp:  [13-17] 17 (10/24 0900) BP: (100-142)/(59-100) 109/96 (10/24 0800) SpO2:  [92 %-97 %] 92 % (10/24 0900) Weight:  [129.3 kg] 129.3 kg (10/24 0402)  GENERAL:NAD HEAD: Normocephalic, atraumatic.  EYES: Pupils equal, round, reactive to light.  No scleral icterus.  MOUTH: Moist mucosal membrane. NECK: Supple. No thyromegaly. No nodules. No JVD.  PULMONARY: decreased bs with bibasilar crackles CARDIOVASCULAR: S1 and S2. Regular rate and rhythm. No murmurs, rubs, or gallops.  GASTROINTESTINAL: Soft, nontender, non-distended. No masses. Positive bowel sounds. No hepatosplenomegaly.  MUSCULOSKELETAL: No swelling, clubbing, or edema.  NEUROLOGIC: Mild distress due to acute illness SKIN:intact,warm,dry   PERTINENT DATA     Infusions: . sodium chloride 0 mL (01/17/19 1010)  . albumin human 12.5 g (02/01/19 0900)  . milrinone 0.25 mcg/kg/min (02/01/19 0700)    Scheduled Medications: . sodium chloride   Intravenous Once  . atorvastatin  40 mg Oral Daily  . Chlorhexidine Gluconate Cloth  6 each Topical Q0600  . furosemide  80 mg Intravenous BID  . insulin aspart  0-5 Units Subcutaneous QHS  . insulin aspart  0-9 Units Subcutaneous TID WC  . insulin aspart  6 Units Subcutaneous TID WC  . insulin glargine  22 Units Subcutaneous QHS  . levothyroxine  50 mcg Oral QAC breakfast  . metoprolol tartrate  25 mg Oral BID  . multivitamin with minerals  1 tablet Oral Daily  . pantoprazole  40 mg Intravenous Q12H  . potassium chloride  20 mEq Oral BID  . predniSONE  20 mg Oral Q breakfast  . sodium chloride flush  3 mL Intravenous Q12H   PRN Medications: sodium chloride, acetaminophen, HYDROcodone-acetaminophen, ondansetron (ZOFRAN) IV, polyethylene glycol, sodium chloride flush Hemodynamic parameters:   Intake/Output: 10/23 0701 - 10/24 0700 In: 527.2 [P.O.:240; I.V.:287.2] Out: 2750 [Urine:2750]  Ventilator  Settings:     Other Labs:     LAB RESULTS:  Basic Metabolic Panel: Recent Labs  Lab 01/28/19 0523 01/29/19 0453 01/30/19 0536 01/31/19 0545 02/01/19 0425  NA 132* 135 134* 134* 134*  K 4.1 3.9 4.3 4.2 4.2  CL 92* 94* 94* 93* 92*  CO2 29 30 31 31  33*  GLUCOSE 235* 231* 225* 211* 224*  BUN 58* 58* 57* 57* 61*  CREATININE 2.20* 2.10* 2.01* 1.87* 2.03*  CALCIUM 8.4* 8.4* 8.5* 8.7* 8.8*  MG 2.1 2.0 2.2  --  2.2  PHOS  --   --   --   --  2.9   Liver Function Tests: Recent Labs  Lab 01/30/19 0536  ALBUMIN 3.1*   No results for input(s): LIPASE, AMYLASE in the last 168 hours. No results for input(s): AMMONIA in the last 168 hours. CBC: Recent Labs  Lab 01/26/19 0616 01/27/19 0542  01/28/19 0523 01/28/19 1423 01/29/19 0211 01/29/19 0453 01/29/19 1352 02/01/19 0425  WBC 6.7 8.7  --  7.5  --   --  6.8  --  7.3  NEUTROABS  --   --   --   --   --   --   --   --  5.4  HGB 8.1* 7.7*   < > 8.8* 9.2* 8.3* 8.1* 8.2* 8.2*   HCT 26.7* 25.8*   < > 28.2* 29.8* 26.6* 25.8* 26.2* 26.8*  MCV 86.1 87.2  --  85.2  --   --  84.3  --  85.4  PLT 196 191  --  184  --   --  192  --  221   < > =  values in this interval not displayed.   Cardiac Enzymes: No results for input(s): CKTOTAL, CKMB, CKMBINDEX, TROPONINI in the last 168 hours. 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 EF [id number] -cardiology on case - appreciate input   - s/p Lasix gtt, now on 80mg  BID  -Primacor drip -Atrial fibrillation - failure of amio cardioversion ->20L net negative diuresis - AC delayed due to LGIB -last CXR 10/17 - repeat today  ICU telemetry monitoring    Renal Failure-Cardiorenal syndrome  - Nephrologist on case - appreciate input  - AKI on CKD3  - diabetic nephropathy  -CKD related bone mineral disorder -follow chem 7 -follow UO -albumin IV -continue Foley Catheter-assess need daily    Acute blood loss anemia due to Lower GI bleed   - montioring H/h 6.7>9.2>8.2 >8.2 -stable   - transfuse to goal of 8 due to active cardiac dz   - s/p EGD - aborted due to acute hypoxemic respiratory failure   -protonix bid per GI   Hyponatremia    Hypervolemic due to advanced heart failure    ID -continue IV abx as prescibed -follow up cultures  GI/Nutrition GI PROPHYLAXIS as indicated DIET-->TF's as tolerated Constipation protocol as indicated  ENDO - ICU hypoglycemic\Hyperglycemia protocol -check FSBS per protocol   ELECTROLYTES -follow labs as needed -replace as needed -pharmacy  consultation   DVT/GI PRX ordered -SCDs  TRANSFUSIONS AS NEEDED MONITOR FSBS ASSESS the need for LABS as needed   Critical care provider statement:    Critical care time (minutes):  109   Critical care time was exclusive of:  Separately billable procedures and treating other patients   Critical care was necessary to treat or prevent imminent or life-threatening deterioration of the following conditions:  acute blood loss anemia, acute on chronic chf, acute on chronic renal failure, morbid obesity, hyponatremia, multiple comorbid conditions.   Critical care was time spent personally by me on the following activities:  Development of treatment plan with patient or surrogate, discussions with consultants, evaluation of patient's response to treatment, examination of patient, obtaining history from patient or surrogate, ordering and performing treatments and interventions, ordering and review of laboratory studies and re-evaluation of patient's condition.  I assumed direction of critical care for this patient from another provider in my specialty: no    This document was prepared using Dragon voice recognition software and may include unintentional dictation errors.    Ottie Glazier, M.D.  Division of [place]

## 2019-02-02 DIAGNOSIS — I255 Ischemic cardiomyopathy: Secondary | ICD-10-CM | POA: Diagnosis not present

## 2019-02-02 DIAGNOSIS — I4819 Other persistent atrial fibrillation: Secondary | ICD-10-CM | POA: Diagnosis not present

## 2019-02-02 LAB — CBC WITH DIFFERENTIAL/PLATELET
Abs Immature Granulocytes: 0.07 10*3/uL (ref 0.00–0.07)
Basophils Absolute: 0 10*3/uL (ref 0.0–0.1)
Basophils Relative: 0 %
Eosinophils Absolute: 0.2 10*3/uL (ref 0.0–0.5)
Eosinophils Relative: 3 %
HCT: 26.4 % — ABNORMAL LOW (ref 36.0–46.0)
Hemoglobin: 8.1 g/dL — ABNORMAL LOW (ref 12.0–15.0)
Immature Granulocytes: 1 %
Lymphocytes Relative: 9 %
Lymphs Abs: 0.6 10*3/uL — ABNORMAL LOW (ref 0.7–4.0)
MCH: 26 pg (ref 26.0–34.0)
MCHC: 30.7 g/dL (ref 30.0–36.0)
MCV: 84.6 fL (ref 80.0–100.0)
Monocytes Absolute: 0.9 10*3/uL (ref 0.1–1.0)
Monocytes Relative: 13 %
Neutro Abs: 5 10*3/uL (ref 1.7–7.7)
Neutrophils Relative %: 74 %
Platelets: 205 10*3/uL (ref 150–400)
RBC: 3.12 MIL/uL — ABNORMAL LOW (ref 3.87–5.11)
RDW: 19.3 % — ABNORMAL HIGH (ref 11.5–15.5)
WBC: 6.8 10*3/uL (ref 4.0–10.5)
nRBC: 0 % (ref 0.0–0.2)

## 2019-02-02 LAB — GLUCOSE, CAPILLARY
Glucose-Capillary: 182 mg/dL — ABNORMAL HIGH (ref 70–99)
Glucose-Capillary: 178 mg/dL — ABNORMAL HIGH (ref 70–99)
Glucose-Capillary: 263 mg/dL — ABNORMAL HIGH (ref 70–99)
Glucose-Capillary: 302 mg/dL — ABNORMAL HIGH (ref 70–99)

## 2019-02-02 LAB — PROTIME-INR
INR: 1.1 (ref 0.8–1.2)
Prothrombin Time: 14.2 seconds (ref 11.4–15.2)

## 2019-02-02 LAB — BASIC METABOLIC PANEL
Anion gap: 12 (ref 5–15)
BUN: 56 mg/dL — ABNORMAL HIGH (ref 8–23)
CO2: 31 mmol/L (ref 22–32)
Calcium: 8.6 mg/dL — ABNORMAL LOW (ref 8.9–10.3)
Chloride: 91 mmol/L — ABNORMAL LOW (ref 98–111)
Creatinine, Ser: 2.05 mg/dL — ABNORMAL HIGH (ref 0.44–1.00)
GFR calc Af Amer: 28 mL/min — ABNORMAL LOW (ref >60)
GFR calc non Af Amer: 24 mL/min — ABNORMAL LOW (ref >60)
Glucose, Bld: 204 mg/dL — ABNORMAL HIGH (ref 70–99)
Potassium: 3.8 mmol/L (ref 3.5–5.1)
Sodium: 134 mmol/L — ABNORMAL LOW (ref 135–145)

## 2019-02-02 LAB — MAGNESIUM: Magnesium: 2.2 mg/dL (ref 1.7–2.4)

## 2019-02-02 MED ORDER — INSULIN GLARGINE 100 UNIT/ML ~~LOC~~ SOLN
25.0000 [IU] | Freq: Every day | SUBCUTANEOUS | Status: DC
  Administered 2019-02-02 – 2019-02-07 (×6): 25 [IU] via SUBCUTANEOUS
  Filled 2019-02-02 (×8): qty 0.25

## 2019-02-02 MED ORDER — INSULIN ASPART 100 UNIT/ML ~~LOC~~ SOLN
8.0000 [IU] | Freq: Three times a day (TID) | SUBCUTANEOUS | Status: DC
  Administered 2019-02-02 – 2019-02-03 (×5): 8 [IU] via SUBCUTANEOUS
  Filled 2019-02-02 (×5): qty 1

## 2019-02-02 NOTE — Progress Notes (Signed)
CRITICAL CARE PROGRESS NOTE    Name: Meghan [name] MRN: [id number] DOB: 03-12-1951     LOS: 65   SUBJECTIVE FINDINGS & SIGNIFICANT EVENTS   Patient description:  68 yo F mobidly obese, with HFrEF, Valvular AF, CKD3, chronic anemia, CVA, HTN, DM, CAD s/p L CEA, dyslipidemia, hx of LGIB  Lines / Drains: PIV on primacor ionotrope drip  Cultures / Sepsis markers: Urine culture with >100k ecoli  Antibiotics: none   Protocols / Consultants: Renal Cardio CC/pulm  10/25-medically optimizing for transfer, significantly clinically improved.  PAST MEDICAL HISTORY   Past Medical History:  Diagnosis Date  . Carotid arterial disease ([place])    a. 02/2012 s/p L CEA.  . CKD stage G3b/A1, GFR 30-44 and albumin creatinine ratio <30 mg/g   . Coronary artery disease    a. 02/2012 Lexi MV: Anterior, anteroseptal, septal, apical infarct with mild peri-infarct ischemia, EF 41%- medically managed due to anemia; b. 03/2017 Cath: LM nl, LAD 99ost/p/m - faint L->L collaterals, RI 20ost, LCX 65m, RCA 20p, 52m-->Med Rx for chronic subtotal LAD dzs. Avoid R Radial access in future.  . 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 Hyperlipidemia   . Hypertension   . Ischemic cardiomyopathy    a. 06/2013 Echo: EF 35-40%, glob HK, mildly dil LA, mild LVH, mild TR, mild to mod MR; b. 07/2016 Echo: EF 25-30%, sev mid-apicalanteroseptal, ant, apical HK, mild MR, sev dil LA, mod dil RA, mod TR, PASP 69mmHg; c. 11/2016 Echo: EF 20-25%, antsept DK, Gr2 DD, mild MR, mod dil LA/RV/RA, mod red RV fxn, mod-sev TR, PASP 40-28mmHg.  . Morbid obesity ([place])   . Normocytic anemia   . Persistent atrial fibrillation (HCC)    a. CHA2DS2VASc = 7-->coumadin; maintaining sinus on amio.  . Stroke [facility]) 2013     SURGICAL HISTORY   Past  Surgical History:  Procedure Laterality Date  . AMPUTATION TOE Left 09/22/2016   Procedure: AMPUTATION TOE;  Surgeon: Albertine Patricia, DPM;  Location: [facility] ORS;  Service: Podiatry;  Laterality: Left;  . CARDIOVERSION N/A 07/28/2016   Procedure: CARDIOVERSION;  Surgeon: Wellington [facility], MD;  Location: [facility] ORS;  Service: Cardiovascular;  Laterality: N/A;  . CARDIOVERSION N/A 08/07/2016   Procedure: CARDIOVERSION;  Surgeon: Wellington Hampshire, MD;  Location: [facility] ORS;  Service: Cardiovascular;  Laterality: N/A;  . CARDIOVERSION N/A 01/03/2019   Procedure: CARDIOVERSION;  Surgeon: Minna Merritts, MD;  Location: [facility] ORS;  Service: Cardiovascular;  Laterality: N/A;  . CAROTID ENDARTERECTOMY     [facility]; Dr. Lucky Cowboy  . CHOLECYSTECTOMY    . ESOPHAGOGASTRODUODENOSCOPY (EGD) WITH PROPOFOL N/A 01/25/2019   Procedure: ESOPHAGOGASTRODUODENOSCOPY (EGD) WITH PROPOFOL;  Surgeon: Virgel Manifold, MD;  Location: [facility] ENDOSCOPY;  Service: Endoscopy;  Laterality: N/A;  . foot infection Right   . LEG SURGERY     right;infection  . RIGHT/LEFT HEART CATH AND CORONARY ANGIOGRAPHY Bilateral 03/12/2017   Procedure: RIGHT/LEFT HEART CATH AND CORONARY ANGIOGRAPHY;  Surgeon: Wellington Hampshire, MD;  Location: [place] CV LAB;  Service: Cardiovascular;  Laterality: Bilateral;  . TONSILLECTOMY       FAMILY HISTORY   Family History  Problem Relation Age of Onset  . Heart disease Mother   . Diabetes Mother   . Heart disease Father   . Hypertension Father   . Diabetes Maternal Grandmother      SOCIAL HISTORY   Social History   Tobacco Use  . Smoking status: Former Smoker  Packs/day: 0.50    Years: 5.00    Pack years: 2.50    Types: Cigarettes  . Smokeless tobacco: Never Used  Substance Use Topics  . Alcohol use: No  . Drug use: No     MEDICATIONS   Current Medication:  Current Facility-Administered Medications:  .  0.9 %  sodium chloride infusion (Manually program via Guardrails IV  Fluids), , Intravenous, Once, Nicholes Mango, MD, Stopped at 01/28/19 [id number] .  0.9 %  sodium chloride infusion, 250 mL, Intravenous, PRN, Lang Snow, NP, Last Rate: 0 mL/hr at 01/17/19 1010 .  acetaminophen (TYLENOL) tablet 650 mg, 650 mg, Oral, Q4H PRN, Lang Snow, NP, 650 mg at 01/24/19 1043 .  albumin human 25 % solution 12.5 g, 12.5 g, Intravenous, Daily, Murlean Iba, MD, Last Rate: 60 mL/hr at 02/02/19 0826, 12.5 g at 02/02/19 0826 .  atorvastatin (LIPITOR) tablet 40 mg, 40 mg, Oral, Daily, Ouma, Bing Neighbors, NP, 40 mg at 02/01/19 1753 .  Chlorhexidine Gluconate Cloth 2 % PADS 6 each, 6 each, Topical, Q0600, Flora Lipps, MD, 6 each at 02/02/19 [phone] .  furosemide (LASIX) injection 80 mg, 80 mg, Intravenous, BID, Kathlyn Sacramento A, MD, 80 mg at 02/02/19 0831 .  HYDROcodone-acetaminophen (NORCO/VICODIN) 5-325 MG per tablet 1-2 tablet, 1-2 tablet, Oral, Q6H PRN, Flora Lipps, MD, 2 tablet at 01/29/19 1321 .  insulin aspart (novoLOG) injection 0-5 Units, 0-5 Units, Subcutaneous, Corwin Levins, MD, 4 Units at 02/01/19 2159 .  insulin aspart (novoLOG) injection 0-9 Units, 0-9 Units, Subcutaneous, TID WC, Lance Coon, MD, 2 Units at 02/02/19 1233 .  insulin aspart (novoLOG) injection 8 Units, 8 Units, Subcutaneous, TID WC, Awilda Bill, NP, 8 Units at 02/02/19 1233 .  insulin glargine (LANTUS) injection 25 Units, 25 Units, Subcutaneous, QHS, Blakeney, Dana G, NP .  levothyroxine (SYNTHROID) tablet 50 mcg, 50 mcg, Oral, QAC breakfast, Lang Snow, NP, 50 mcg at 02/02/19 [id number] .  metoprolol tartrate (LOPRESSOR) tablet 25 mg, 25 mg, Oral, BID, Gouru, Aruna, MD, 25 mg at 02/02/19 0827 .  milrinone (PRIMACOR) 20 MG/100 ML (0.2 mg/mL) infusion, 0.25 mcg/kg/min, Intravenous, Continuous, Gouru, Aruna, MD, Last Rate: 10.07 mL/hr at 02/02/19 0234, 0.25 mcg/kg/min at 02/02/19 0234 .  multivitamin with minerals tablet 1 tablet, 1 tablet, Oral, Daily, Gouru,  Aruna, MD, 1 tablet at 02/02/19 0828 .  ondansetron (ZOFRAN) injection 4 mg, 4 mg, Intravenous, Q6H PRN, Ouma, Bing Neighbors, NP .  pantoprazole (PROTONIX) injection 40 mg, 40 mg, Intravenous, Q12H, Darel Hong D, NP, 40 mg at 02/02/19 0829 .  polyethylene glycol (MIRALAX / GLYCOLAX) packet 17 g, 17 g, Oral, Daily PRN, Lin Landsman, MD, 17 g at 01/28/19 0429 .  potassium chloride SA (KLOR-CON) CR tablet 20 mEq, 20 mEq, Oral, BID, Leslye Peer, Richard, MD, 20 mEq at 02/02/19 [id number] .  predniSONE (DELTASONE) tablet 20 mg, 20 mg, Oral, Q breakfast, Gouru, Aruna, MD, 20 mg at 02/02/19 0827 .  sodium chloride flush (NS) 0.9 % injection 3 mL, 3 mL, Intravenous, Q12H, Ouma, Bing Neighbors, NP, 3 mL at 02/02/19 [phone] .  sodium chloride flush (NS) 0.9 % injection 3 mL, 3 mL, Intravenous, PRN, Ouma, Bing Neighbors, NP    ALLERGIES   Spironolactone    REVIEW OF SYSTEMS     10 point ROS negative except as per subj findings  PHYSICAL EXAMINATION   Vital Signs: Temp:  [97.9 F (36.6 C)-98.3 F (36.8 C)] 98.3 F (36.8 C) (10/25 1200) Pulse Rate:  [  39-111] 80 (10/25 1200) Resp:  [14-22] 15 (10/25 1200) BP: (105-126)/(61-91) 113/64 (10/25 1200) SpO2:  [92 %-98 %] 96 % (10/25 1200) Weight:  [118.1 kg] 118.1 kg (10/25 0633)  GENERAL:NAD HEAD: Normocephalic, atraumatic.  EYES: Pupils equal, round, reactive to light.  No scleral icterus.  MOUTH: Moist mucosal membrane. NECK: Supple. No thyromegaly. No nodules. No JVD.  PULMONARY: decreased bs with bibasilar crackles CARDIOVASCULAR: S1 and S2. Regular rate and rhythm. No murmurs, rubs, or gallops.  GASTROINTESTINAL: Soft, nontender, non-distended. No masses. Positive bowel sounds. No hepatosplenomegaly.  MUSCULOSKELETAL: No swelling, clubbing, or edema.  NEUROLOGIC: Mild distress due to acute illness SKIN:intact,warm,dry   PERTINENT DATA     Infusions: . sodium chloride 0 mL (01/17/19 1010)  . albumin human 12.5 g  (02/02/19 0826)  . milrinone 0.25 mcg/kg/min (02/02/19 0234)   Scheduled Medications: . sodium chloride   Intravenous Once  . atorvastatin  40 mg Oral Daily  . Chlorhexidine Gluconate Cloth  6 each Topical Q0600  . furosemide  80 mg Intravenous BID  . insulin aspart  0-5 Units Subcutaneous QHS  . insulin aspart  0-9 Units Subcutaneous TID WC  . insulin aspart  8 Units Subcutaneous TID WC  . insulin glargine  25 Units Subcutaneous QHS  . levothyroxine  50 mcg Oral QAC breakfast  . metoprolol tartrate  25 mg Oral BID  . multivitamin with minerals  1 tablet Oral Daily  . pantoprazole  40 mg Intravenous Q12H  . potassium chloride  20 mEq Oral BID  . predniSONE  20 mg Oral Q breakfast  . sodium chloride flush  3 mL Intravenous Q12H   PRN Medications: sodium chloride, acetaminophen, HYDROcodone-acetaminophen, ondansetron (ZOFRAN) IV, polyethylene glycol, sodium chloride flush Hemodynamic parameters:   Intake/Output: 10/24 0701 - 10/25 0700 In: 229.6 [I.V.:229.6] Out: 3450 [id number]  Ventilator  Settings:     Other Labs:     LAB RESULTS:  Basic Metabolic Panel: Recent Labs  Lab 01/28/19 0523 01/29/19 0453 01/30/19 0536 01/31/19 0545 02/01/19 0425 02/02/19 0422  NA 132* 135 134* 134* 134* 134*  K 4.1 3.9 4.3 4.2 4.2 3.8  CL 92* 94* 94* 93* 92* 91*  CO2 29 30 31 31  33* 31  GLUCOSE 235* 231* 225* 211* 224* 204*  BUN 58* 58* 57* 57* 61* 56*  CREATININE 2.20* 2.10* 2.01* 1.87* 2.03* 2.05*  CALCIUM 8.4* 8.4* 8.5* 8.7* 8.8* 8.6*  MG 2.1 2.0 2.2  --  2.2 2.2  PHOS  --   --   --   --  2.9  --    Liver Function Tests: Recent Labs  Lab 01/30/19 0536  ALBUMIN 3.1*   No results for input(s): LIPASE, AMYLASE in the last 168 hours. No results for input(s): AMMONIA in the last 168 hours. CBC: Recent Labs  Lab 01/27/19 0542  01/28/19 0523  01/29/19 0211 01/29/19 0453 01/29/19 1352 02/01/19 0425 02/02/19 0422  WBC 8.7  --  7.5  --   --  6.8  --  7.3 6.8   NEUTROABS  --   --   --   --   --   --   --  5.4 5.0  HGB 7.7*   < > 8.8*   < > 8.3* 8.1* 8.2* 8.2* 8.1*  HCT 25.8*   < > 28.2*   < > 26.6* 25.8* 26.2* 26.8* 26.4*  MCV 87.2  --  85.2  --   --  84.3  --  85.4 84.6  PLT 191  --  184  --   --  192  --  221 205   < > = values in this interval not displayed.   Cardiac Enzymes: No results for input(s): CKTOTAL, CKMB, CKMBINDEX, TROPONINI in the last 168 hours. BNP: Invalid input(s): POCBNP CBG: Recent Labs  Lab 02/01/19 1634 02/01/19 2017 02/01/19 2159 02/02/19 0750 02/02/19 1154  GLUCAP 324* 338* 344* 182* 178*     IMAGING RESULTS:     ASSESSMENT AND PLAN      Acute Hypoxic Respiratory Failure -post EGD -likely sedation related  - resolved now -continue Bronchodilator Therapy -Biots respiratory pattern likely due to central hypopnea with OHS   Acute on chronic decompensated combined systolic and diastolic CHF with EF [id number] -cardiology on case - appreciate input   - s/p Lasix gtt, now on 80mg  BID  -Primacor drip -Atrial fibrillation - failure of amio cardioversion ->20L net negative diuresis - AC delayed due to LGIB -last CXR 10/17 - repeat today  ICU telemetry monitoring    Renal Failure-Cardiorenal syndrome  - Nephrologist on case - appreciate input  - AKI on CKD3  - diabetic nephropathy  -CKD related bone mineral disorder -follow chem 7 -follow UO -albumin IV -continue Foley Catheter-assess need daily    Acute blood loss anemia due to Lower GI bleed   - montioring H/h 6.7>9.2>8.2 >8.2 -stable   - transfuse to goal of 8 due to active cardiac dz   - s/p EGD - aborted due to acute hypoxemic respiratory failure   -protonix bid per GI   Hyponatremia    Hypervolemic due to advanced heart failure    ID -continue IV abx as prescibed -follow up cultures  GI/Nutrition GI PROPHYLAXIS as indicated DIET-->TF's as tolerated Constipation protocol as indicated  ENDO - ICU hypoglycemic\Hyperglycemia  protocol -check FSBS per protocol   ELECTROLYTES -follow labs as needed -replace as needed -pharmacy consultation   DVT/GI PRX ordered -SCDs  TRANSFUSIONS AS NEEDED MONITOR FSBS ASSESS the need for LABS as needed   Critical care provider statement:    Critical care time (minutes):  32   Critical care time was exclusive of:  Separately billable procedures and treating other patients   Critical care was necessary to treat or prevent imminent or life-threatening deterioration of the following conditions:  acute blood loss anemia, acute on chronic chf, acute on chronic renal failure, morbid obesity, hyponatremia, multiple comorbid conditions.   Critical care was time spent personally by me on the following activities:  Development of treatment plan with patient or surrogate, discussions with consultants, evaluation of patient's response to treatment, examination of patient, obtaining history from patient or surrogate, ordering and performing treatments and interventions, ordering and review of laboratory studies and re-evaluation of patient's condition.  I assumed direction of critical care for this patient from another provider in my specialty: no    This document was prepared using Dragon voice recognition software and may include unintentional dictation errors.    Ottie Glazier, M.D.  Division of [place]

## 2019-02-02 NOTE — Progress Notes (Signed)
Progress Note  Patient Name: Meghan [name] Date of Encounter: 02/02/2019  Primary Cardiologist: Kathlyn Sacramento, MD   Subjective   Patient states doing well this morning, feels much better and her swelling seems to be improving.  Inpatient Medications    Scheduled Meds: . sodium chloride   Intravenous Once  . atorvastatin  40 mg Oral Daily  . Chlorhexidine Gluconate Cloth  6 each Topical Q0600  . furosemide  80 mg Intravenous BID  . insulin aspart  0-5 Units Subcutaneous QHS  . insulin aspart  0-9 Units Subcutaneous TID WC  . insulin aspart  8 Units Subcutaneous TID WC  . insulin glargine  25 Units Subcutaneous QHS  . levothyroxine  50 mcg Oral QAC breakfast  . metoprolol tartrate  25 mg Oral BID  . multivitamin with minerals  1 tablet Oral Daily  . pantoprazole  40 mg Intravenous Q12H  . potassium chloride  20 mEq Oral BID  . predniSONE  20 mg Oral Q breakfast  . sodium chloride flush  3 mL Intravenous Q12H   Continuous Infusions: . sodium chloride 0 mL (01/17/19 1010)  . albumin human 12.5 g (02/02/19 0826)  . milrinone 0.25 mcg/kg/min (02/02/19 0234)   PRN Meds: sodium chloride, acetaminophen, HYDROcodone-acetaminophen, ondansetron (ZOFRAN) IV, polyethylene glycol, sodium chloride flush   Vital Signs    Vitals:   02/02/19 0700 02/02/19 0800 02/02/19 0827 02/02/19 0900  BP: 121/69 106/71 115/61 105/61  Pulse: 72 94 86 80  Resp: 14 15  18   Temp:  98.2 F (36.8 C)    TempSrc:  Oral    SpO2: 92% 98%  98%  Weight:      Height:        Intake/Output Summary (Last 24 hours) at 02/02/2019 1039 Last data filed at 02/02/2019 0900 Gross per 24 hour  Intake 363.61 ml  Output 3050 ml  Net -2686.39 ml   Last 3 Weights 02/02/2019 02/01/2019 01/31/2019  Weight (lbs) 260 lb 5.8 oz 285 lb 0.9 oz 281 lb 15.5 oz  Weight (kg) 118.1 kg 129.3 kg 127.9 kg      Telemetry    Atrial flutter, heart rate 85- Personally Reviewed  ECG    Performed today-  Physical Exam   GEN: No acute distress, obese.   Neck: No JVD Cardiac:  Regular, not tachycardic, no murmurs, rubs, or gallops.  Respiratory:  Decreased breath sounds at bases, poor inspiratory effort. GI: Soft, nontender, obese MS:  2+ edema; No deformity. Neuro:  Nonfocal  Psych: Normal affect   Labs    High Sensitivity Troponin:   Recent Labs  Lab 01/17/19 0011  TROPONINIHS 13      Chemistry Recent Labs  Lab 01/30/19 0536 01/31/19 0545 02/01/19 0425 02/02/19 0422  NA 134* 134* 134* 134*  K 4.3 4.2 4.2 3.8  CL 94* 93* 92* 91*  CO2 31 31 33* 31  GLUCOSE 225* 211* 224* 204*  BUN 57* 57* 61* 56*  CREATININE 2.01* 1.87* 2.03* 2.05*  CALCIUM 8.5* 8.7* 8.8* 8.6*  ALBUMIN 3.1*  --   --   --   GFRNONAA 25* 27* 24* 24*  GFRAA 28* 31* 28* 28*  ANIONGAP 9 10 9 12      Hematology Recent Labs  Lab 01/29/19 0453 01/29/19 1352 02/01/19 0425 02/02/19 0422  WBC 6.8  --  7.3 6.8  RBC 3.06*  --  3.14* 3.12*  HGB 8.1* 8.2* 8.2* 8.1*  HCT 25.8* 26.2* 26.8* 26.4*  MCV 84.3  --  85.4 84.6  MCH 26.5  --  26.1 26.0  MCHC 31.4  --  30.6 30.7  RDW 19.2*  --  19.2* 19.3*  PLT 192  --  221 205    BNP Recent Labs  Lab 02/01/19 0425  BNP 563.0*     DDimer No results for input(s): DDIMER in the last 168 hours.   Radiology    Dg Chest Port 1 View  Result Date: 02/01/2019 CLINICAL DATA:  Pulmonary edema EXAM: PORTABLE CHEST 1 VIEW COMPARISON:  01/25/2019 FINDINGS: Gross cardiomegaly. Mild, diffuse interstitial pulmonary opacity and pulmonary vascular prominence, not significantly changed compared to prior examination. The visualized skeletal structures are unremarkable. IMPRESSION: 1. Mild, diffuse interstitial pulmonary opacity and pulmonary vascular prominence, not significantly changed compared to prior examination. Findings are most consistent with edema. No new airspace opacity. 2.  Gross cardiomegaly. Electronically Signed   By: Eddie Candle M.D.   On: 02/01/2019 14:03    Cardiac  Studies   2D echo 12/14/2018: 1. The left ventricle has severely reduced systolic function, with an ejection fraction of 25-30%. The cavity size was mildly dilated. Left ventricular diastolic Doppler parameters are indeterminate. Left ventricular diffuse hypokinesis. 2. The right ventricle has mildly reduced systolic function. The cavity was moderately enlarged. There is no increase in right ventricular wall thickness. Right ventricular systolic pressure is moderately elevated with an estimated pressure of 56.2  mmHg. 3. Left atrial size was severely dilated. 4. Right atrial size was moderately dilated. 5. Tricuspid valve regurgitation is moderate. 6. Rhythm is atrial fibrillation  Patient Profile     68 y.o. female history of CAD, ischemic cardiomyopathy last ejection fraction 25 to 30%, atrial flutter/atrial fibrillation, anemia being evaluated due to fluid overload.  Assessment & Plan  Patient seems to be improving clinically, she is net -3.2 L over the past 24 hours.  Her creatinine has pretty much stayed the same.  Her heart rate is controlled.  Ischemic cardiomyopathy, last EF 25 to 30%.   Continue diuresing with Lasix.  She is -3.2 L over the past 24 hours.  Currently on milrinone.  Monitor creatinine with plan to decrease Lasix dose if creatinine is trending upwards.    A. fib/flutter.  Continue Lopressor for heart rate control.  Holding anticoagulation due to history of blood loss.  Greater than 50% was spent in counseling and coordination of care with patient Total encounter time 25 minutes or more      Signed, Kate Sable, MD  02/02/2019, 10:39 AM

## 2019-02-02 NOTE — Consult Note (Signed)
PHARMACY CONSULT NOTE  Pharmacy Consult for Electrolyte Monitoring and Replacement   Recent Labs: Potassium (mmol/L)  Date Value  02/02/2019 3.8  07/10/2013 3.6   Magnesium (mg/dL)  Date Value  02/02/2019 2.2  07/10/2013 1.5 (L)   Calcium (mg/dL)  Date Value  02/02/2019 8.6 (L)   Calcium, Total (mg/dL)  Date Value  07/10/2013 8.1 (L)   Albumin (g/dL)  Date Value  01/30/2019 3.1 (L)  06/15/2017 3.5 (L)  07/08/2013 1.7 (L)   Phosphorus (mg/dL)  Date Value  02/01/2019 2.9   Sodium (mmol/L)  Date Value  02/02/2019 134 (L)  12/24/2018 135  07/10/2013 134 (L)   Corrected Ca: 9.74 mg/dL  Assessment: 68 y.o. female with medical problems of chronic systolic congestive heart failure, coronary disease, atrial fibrillation requiring anticoagulation, diabetes, anemia, carotid artery disease with history of left carotid endarterectomy, previous stroke, hypertension, chronic kidney disease, was admitted on10/9/2020with shortness of breath worsening edema, acute kidney injury.   KCl 20 mEq BID, Furosemide 80mg  BID   Goal of Therapy:  Given cardiac history: Potassium 4.0 - 5.1 mmol/L Magnesium: 2.0 - 2.4 mg/dL Other electrolytes WNL  Plan:   Continue potassium 20 mEq PO BID  No additional electrolyte supplementation warranted at this time  Recheck electrolytes with am labs.  Olivia Canter, [facility] 02/02/2019 8:47 AM

## 2019-02-02 NOTE — Progress Notes (Signed)
Meghan [name]  MRN: [id number]  DOB/AGE: 19-May-1950 68 y.o.  Primary Care Physician:Jones, Iven Finn, MD  Admit date: 01/17/2019  Chief Complaint:  Chief Complaint  Patient presents with  . Shortness of Breath    S-Pt presented on  01/17/2019 with  Chief Complaint  Patient presents with  . Shortness of Breath  .  Patient offers no new complaints   Pt today feels better  Meds  . sodium chloride   Intravenous Once  . atorvastatin  40 mg Oral Daily  . Chlorhexidine Gluconate Cloth  6 each Topical Q0600  . furosemide  80 mg Intravenous BID  . insulin aspart  0-5 Units Subcutaneous QHS  . insulin aspart  0-9 Units Subcutaneous TID WC  . insulin aspart  8 Units Subcutaneous TID WC  . insulin glargine  25 Units Subcutaneous QHS  . levothyroxine  50 mcg Oral QAC breakfast  . metoprolol tartrate  25 mg Oral BID  . multivitamin with minerals  1 tablet Oral Daily  . pantoprazole  40 mg Intravenous Q12H  . potassium chloride  20 mEq Oral BID  . predniSONE  20 mg Oral Q breakfast  . sodium chloride flush  3 mL Intravenous Q12H         [id number] from the symptoms mentioned above,there are no other symptoms referable to all systems reviewed.  Physical Exam: Vital signs in last 24 hours: Temp:  [97.9 F (36.6 C)-98.2 F (36.8 C)] 98.2 F (36.8 C) (10/25 0800) Pulse Rate:  [39-111] 80 (10/25 0900) Resp:  [14-20] 18 (10/25 0900) BP: (105-126)/(61-91) 105/61 (10/25 0900) SpO2:  [92 %-98 %] 98 % (10/25 0900) Weight:  [118.1 kg] 118.1 kg (10/25 [id number]) Weight change: -11.2 kg Last BM Date: 02/01/19  Intake/Output from previous day: 10/24 0701 - 10/25 0700 In: 229.6 [I.V.:229.6] Out: 3450 [Urine:3450] Total I/O In: 140 [P.O.:120; I.V.:20] Out: -    Physical Exam: General- pt is awake,alert, oriented to time place and person Resp- No acute REsp distress,  Minimal Rhonchi present CVS- S1S2 regular in rate and rhythm GIT- BS+, soft, NT, ND EXT- 2+ LE Edema, no  cyanosis   Lab Results: CBC Recent Labs    02/01/19 0425 02/02/19 0422  WBC 7.3 6.8  HGB 8.2* 8.1*  HCT 26.8* 26.4*  PLT 221 205    BMET Recent Labs    02/01/19 0425 02/02/19 0422  NA 134* 134*  K 4.2 3.8  CL 92* 91*  CO2 33* 31  GLUCOSE 224* 204*  BUN 61* 56*  CREATININE 2.03* 2.05*  CALCIUM 8.8* 8.6*    Creat trend 2020  3.8==> 1.8--2.0           1.7--2.4( September admission) 2019  1.2--1.9 2018  1.2--1.8    MICRO No results found for this or any previous visit (from the past 240 hour(s)).    Lab Results  Component Value Date   CALCIUM 8.6 (L) 02/02/2019   PHOS 2.9 02/01/2019       Impression:  Ms. Meghan [name] is a 68 y.o. white female with chronic systolic congestive heart failure, coronary disease, atrial fibrillation on anticoagulation, diabetes mellitus type II, anemia, carotid artery disease with history of left carotid endarterectomy, stroke, hypertension, chronic kidney disease stage III, was admitted on10/9/2020with shortness of breath worsening edema, acute kidney injury.   1)Renal  AKI secondary to cardiorenal syndrome                AKI on CKD  CKD stage 3B.               CKD since 2018               CKD secondary to diabetes mellitus                Progression of CKD marked with multiple acute kidney injury episodes                Proteinuria  Present  2)HTN  Medication- On Diuretics On Beta blockers    3)Anemia of acute blood loss  HGb stable   4)CKD Mineral-Bone Disorder Phosphorus at goal.   5) Acute exacerbation of systolic congestive heart failure.  IV furosemide as above.  - Milirone as per cardiology  6) Hyponatremia      Hypervolemic     Stable  7)Acid base Co2 at goal     Plan:   we will continue current IV diuresis     Meghan [name] s Meghan [name] 02/02/2019, 10:25 AM

## 2019-02-02 NOTE — Progress Notes (Signed)
[place] [facility] [place] NAME: Meghan [name]    MR#:  [id number]  DATE OF BIRTH:  February 28, 1951  SUBJECTIVE:  patient seen in the ICU. Daughter at bedside. Overall having a better day.  on IV milrinone gtt  REVIEW OF SYSTEMS:   Review of Systems  Constitutional: Negative for chills, fever and weight loss.  HENT: Negative for ear discharge, ear pain and nosebleeds.   Eyes: Negative for blurred vision, pain and discharge.  Respiratory: Positive for shortness of breath. Negative for sputum production, wheezing and stridor.   Cardiovascular: Negative for chest pain, palpitations, orthopnea and PND.  Gastrointestinal: Negative for abdominal pain, diarrhea, nausea and vomiting.  Genitourinary: Negative for frequency and urgency.  Musculoskeletal: Negative for back pain and joint pain.  Neurological: Negative for sensory change, speech change, focal weakness and weakness.  Psychiatric/Behavioral: Negative for depression and hallucinations. The patient is not nervous/anxious.    Tolerating Diet:yes Tolerating PT: yes  DRUG ALLERGIES:   Allergies  Allergen Reactions  . Spironolactone     Hyperkalemia     VITALS:  Blood pressure 119/72, pulse 85, temperature 98.2 F (36.8 C), temperature source Oral, resp. rate 15, height 5\' 7"  (1.702 m), weight 118.1 kg, SpO2 96 %.  PHYSICAL EXAMINATION:   Physical Exam  GENERAL:  68 y.o.-year-old patient lying in the bed with no acute distress. obese EYES: Pupils equal, round, reactive to light and accommodation. No scleral icterus. Extraocular muscles intact.  HEENT: Head atraumatic, normocephalic. Oropharynx and nasopharynx clear.  NECK:  Supple, no jugular venous distention. No thyroid enlargement, no tenderness.  LUNGS: Normal breath sounds bilaterally, no wheezing, rales, rhonchi. No use of accessory muscles of respiration.  CARDIOVASCULAR: S1, S2 normal. No murmurs, rubs, or gallops.  ABDOMEN:  Soft, nontender, nondistended. Bowel sounds present. No organomegaly or mass.  EXTREMITIES: No cyanosis, clubbing  ++ edema b/l.    NEUROLOGIC: Cranial nerves II through XII are intact. No focal Motor or sensory deficits b/l.   PSYCHIATRIC:  patient is alert and oriented x 3.  SKIN: No obvious rash, lesion, or ulcer.   LABORATORY PANEL:  CBC Recent Labs  Lab 02/02/19 0422  WBC 6.8  HGB 8.1*  HCT 26.4*  PLT 205    Chemistries  Recent Labs  Lab 02/02/19 0422  NA 134*  K 3.8  CL 91*  CO2 31  GLUCOSE 204*  BUN 56*  CREATININE 2.05*  CALCIUM 8.6*  MG 2.2   Cardiac Enzymes No results for input(s): TROPONINI in the last 168 hours. RADIOLOGY:  Dg Chest Port 1 View  Result Date: 02/01/2019 CLINICAL DATA:  Pulmonary edema EXAM: PORTABLE CHEST 1 VIEW COMPARISON:  01/25/2019 FINDINGS: Gross cardiomegaly. Mild, diffuse interstitial pulmonary opacity and pulmonary vascular prominence, not significantly changed compared to prior examination. The visualized skeletal structures are unremarkable. IMPRESSION: 1. Mild, diffuse interstitial pulmonary opacity and pulmonary vascular prominence, not significantly changed compared to prior examination. Findings are most consistent with edema. No new airspace opacity. 2.  Gross cardiomegaly. Electronically Signed   By: Eddie Candle M.D.   On: 02/01/2019 14:03   ASSESSMENT AND PLAN:  68 y.o. 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  1. Acute on  chronic systolic and diastolic congestive heart with Cardiogenic shock  failure exacerbation., Improving slowly, currently 12 L has been diuresed -continue milrinone drip with Lasix 80 IV every 12 for diuresis,  follow up cardiology recommendations.    2. Hypoxia secondary to heart failure exacerbation.  Patient on 2 L of oxygen and normally she does not wear oxygen.  Wean down as tolerated  3. GI bleed and melena resolved.  Vitamin K given to reverse Coumadin. Reconsulted GI, seen by Dr. Bonna Gains, scheduled for EGD on 01/25/2019 but the procedure was canceled as patient became hypoxemic significantly with minimal sedation.  - No active bleeding ,hemoglobin 8.1 -8.0 -8.1  4. Acute blood loss anemia.  Status post multiple transfusions.  GI is recommending bleeding scan if patient starts bleeding again.  GI signed off.  Currently no active bleed and hgb stable  5. Acute kidney injury on chronic kidney disease 3 secondary to cardiorenal syndrome.  Creatinine improving with diuresis and on milrinone drip.  Nephrology is following, agreeable to continue IV Lasix and milrinone drip  6. Persistent atrial fibrillation status post cardioversion and atrial fibrillation.  Continue amiodarone.  Coumadin discontinued, as patient failed Coumadin challenge.  For frequent PVCs we will continue beta-blocker and cardiology notified.  Not a further candidate for cardioversion patient is not on anticoagulation--will need to be addressed if pt can be on some form of anticoagualiton  7. type 2 diabetes mellitus on sliding scale insulin and long-acting insulin.  9.History of CVA -discontinue Coumadin in view of GI bleed  10.Urinary tract infection treated with 5 days of Rocephin.   11.  Head lice-permethrin treatment given on 01/25/2019, reassessment no lice infestation noticed by the RN   12.  Generalized weakness physical therapy consult-patient is almost bedbound needs assistance with transfers at her baseline   Case discussed with Care Management/Social Worker. Management plans discussed with the patient, family and they are in agreement.  CODE STATUS: full  DVT Prophylaxis: SCD  TOTAL TIME TAKING CARE OF THIS  PATIENT: *30* minutes.  >50% time spent on counselling and coordination of care  POSSIBLE D/C IN *few* DAYS, DEPENDING ON CLINICAL CONDITION.  Note: This dictation was prepared with Dragon dictation along with smaller phrase technology. Any transcriptional errors that result from this process are unintentional.  Fritzi Mandes M.D on 02/02/2019 at 11:44 AM  Between 7am to 6pm - Pager - [phone]  After 6pm go to [email or web address] - password [email or web address] [facility]  Office  [phone]  CC: Primary care physician; [name], MDPatient ID: [name], female   DOB: [birth year], 68 y.o.   MRN: [id number]

## 2019-02-03 DIAGNOSIS — I255 Ischemic cardiomyopathy: Secondary | ICD-10-CM | POA: Diagnosis not present

## 2019-02-03 DIAGNOSIS — I4891 Unspecified atrial fibrillation: Secondary | ICD-10-CM | POA: Diagnosis not present

## 2019-02-03 LAB — BASIC METABOLIC PANEL
Anion gap: 12 (ref 5–15)
BUN: 54 mg/dL — ABNORMAL HIGH (ref 8–23)
CO2: 31 mmol/L (ref 22–32)
Calcium: 8.7 mg/dL — ABNORMAL LOW (ref 8.9–10.3)
Chloride: 90 mmol/L — ABNORMAL LOW (ref 98–111)
Creatinine, Ser: 1.86 mg/dL — ABNORMAL HIGH (ref 0.44–1.00)
GFR calc Af Amer: 31 mL/min — ABNORMAL LOW (ref >60)
GFR calc non Af Amer: 27 mL/min — ABNORMAL LOW (ref >60)
Glucose, Bld: 162 mg/dL — ABNORMAL HIGH (ref 70–99)
Potassium: 4.1 mmol/L (ref 3.5–5.1)
Sodium: 133 mmol/L — ABNORMAL LOW (ref 135–145)

## 2019-02-03 LAB — GLUCOSE, CAPILLARY
Glucose-Capillary: 143 mg/dL — ABNORMAL HIGH (ref 70–99)
Glucose-Capillary: 224 mg/dL — ABNORMAL HIGH (ref 70–99)
Glucose-Capillary: 320 mg/dL — ABNORMAL HIGH (ref 70–99)
Glucose-Capillary: 397 mg/dL — ABNORMAL HIGH (ref 70–99)

## 2019-02-03 LAB — PROTIME-INR
INR: 1.2 (ref 0.8–1.2)
Prothrombin Time: 14.8 seconds (ref 11.4–15.2)

## 2019-02-03 LAB — ALBUMIN: Albumin: 3.3 g/dL — ABNORMAL LOW (ref 3.5–5.0)

## 2019-02-03 MED ORDER — INSULIN ASPART 100 UNIT/ML ~~LOC~~ SOLN
10.0000 [IU] | Freq: Three times a day (TID) | SUBCUTANEOUS | Status: DC
  Administered 2019-02-03 – 2019-02-06 (×9): 10 [IU] via SUBCUTANEOUS
  Filled 2019-02-03 (×8): qty 1

## 2019-02-03 NOTE — Progress Notes (Signed)
Progress Note  Patient Name: Meghan [name] Date of Encounter: 02/03/2019  Primary Cardiologist: Kathlyn Sacramento, MD   Subjective   Doing okay today, no acute events overnight.  Inpatient Medications    Scheduled Meds:  sodium chloride   Intravenous Once   atorvastatin  40 mg Oral Daily   Chlorhexidine Gluconate Cloth  6 each Topical Q0600   furosemide  80 mg Intravenous BID   insulin aspart  0-5 Units Subcutaneous QHS   insulin aspart  0-9 Units Subcutaneous TID WC   insulin aspart  8 Units Subcutaneous TID WC   insulin glargine  25 Units Subcutaneous QHS   levothyroxine  50 mcg Oral QAC breakfast   metoprolol tartrate  25 mg Oral BID   multivitamin with minerals  1 tablet Oral Daily   pantoprazole  40 mg Intravenous Q12H   potassium chloride  20 mEq Oral BID   predniSONE  20 mg Oral Q breakfast   sodium chloride flush  3 mL Intravenous Q12H   Continuous Infusions:  sodium chloride 0 mL (01/17/19 1010)   albumin human 12.5 g (02/03/19 1032)   milrinone 0.25 mcg/kg/min (02/03/19 0742)   PRN Meds: sodium chloride, acetaminophen, HYDROcodone-acetaminophen, ondansetron (ZOFRAN) IV, polyethylene glycol, sodium chloride flush   Vital Signs    Vitals:   02/03/19 0800 02/03/19 0900 02/03/19 1000 02/03/19 1021  BP: 129/74  108/70   Pulse: (!) 43 (!) 101 (!) 44 83  Resp: 15 19 17    Temp:      TempSrc:      SpO2: 97% 95% 98%   Weight:      Height:        Intake/Output Summary (Last 24 hours) at 02/03/2019 1134 Last data filed at 02/03/2019 0429 Gross per 24 hour  Intake 294.68 ml  Output 3650 ml  Net -3355.32 ml   Last 3 Weights 02/02/2019 02/01/2019 01/31/2019  Weight (lbs) 260 lb 5.8 oz 285 lb 0.9 oz 281 lb 15.5 oz  Weight (kg) 118.1 kg 129.3 kg 127.9 kg      Telemetry    Atrial flutter, heart rate 85- Personally Reviewed  ECG    Performed today-  Physical Exam   GEN: No acute distress, obese.   Neck: No JVD Cardiac:  Regular, not  tachycardic, no murmurs, rubs, or gallops.  Respiratory:  Decreased breath sounds at bases, poor inspiratory effort. GI: Soft, nontender, obese MS:  2+ edema; No deformity. Neuro:  Nonfocal  Psych: Normal affect   Labs    High Sensitivity Troponin:   Recent Labs  Lab 01/17/19 0011  TROPONINIHS 13      Chemistry Recent Labs  Lab 01/30/19 0536  02/01/19 0425 02/02/19 0422 02/03/19 0552  NA 134*   < > 134* 134* 133*  K 4.3   < > 4.2 3.8 4.1  CL 94*   < > 92* 91* 90*  CO2 31   < > 33* 31 31  GLUCOSE 225*   < > 224* 204* 162*  BUN 57*   < > 61* 56* 54*  CREATININE 2.01*   < > 2.03* 2.05* 1.86*  CALCIUM 8.5*   < > 8.8* 8.6* 8.7*  ALBUMIN 3.1*  --   --   --   --   GFRNONAA 25*   < > 24* 24* 27*  GFRAA 28*   < > 28* 28* 31*  ANIONGAP 9   < > 9 12 12    < > = values in this interval not  displayed.     Hematology Recent Labs  Lab 01/29/19 0453 01/29/19 1352 02/01/19 0425 02/02/19 0422  WBC 6.8  --  7.3 6.8  RBC 3.06*  --  3.14* 3.12*  HGB 8.1* 8.2* 8.2* 8.1*  HCT 25.8* 26.2* 26.8* 26.4*  MCV 84.3  --  85.4 84.6  MCH 26.5  --  26.1 26.0  MCHC 31.4  --  30.6 30.7  RDW 19.2*  --  19.2* 19.3*  PLT 192  --  221 205    BNP Recent Labs  Lab 02/01/19 0425  BNP 563.0*     DDimer No results for input(s): DDIMER in the last 168 hours.   Radiology    Dg Chest Port 1 View  Result Date: 02/01/2019 CLINICAL DATA:  Pulmonary edema EXAM: PORTABLE CHEST 1 VIEW COMPARISON:  01/25/2019 FINDINGS: Gross cardiomegaly. Mild, diffuse interstitial pulmonary opacity and pulmonary vascular prominence, not significantly changed compared to prior examination. The visualized skeletal structures are unremarkable. IMPRESSION: 1. Mild, diffuse interstitial pulmonary opacity and pulmonary vascular prominence, not significantly changed compared to prior examination. Findings are most consistent with edema. No new airspace opacity. 2.  Gross cardiomegaly. Electronically Signed   By: Eddie Candle M.D.   On: 02/01/2019 14:03    Cardiac Studies   2D echo 12/14/2018: 1. The left ventricle has severely reduced systolic function, with an ejection fraction of 25-30%. The cavity size was mildly dilated. Left ventricular diastolic Doppler parameters are indeterminate. Left ventricular diffuse hypokinesis. 2. The right ventricle has mildly reduced systolic function. The cavity was moderately enlarged. There is no increase in right ventricular wall thickness. Right ventricular systolic pressure is moderately elevated with an estimated pressure of 56.2  mmHg. 3. Left atrial size was severely dilated. 4. Right atrial size was moderately dilated. 5. Tricuspid valve regurgitation is moderate. 6. Rhythm is atrial fibrillation  Patient Profile     68 y.o. female history of CAD, ischemic cardiomyopathy last ejection fraction 25 to 30%, atrial flutter/atrial fibrillation, anemia being evaluated due to fluid overload.  Assessment & Plan  Patient seems to be improving clinically, she is net -3.7 L over the past 24 hours.  Her creatinine is now improving with diuresing.Marland Kitchen  Her heart rate is controlled.  Ischemic cardiomyopathy, last EF 25 to 30%.   Continue diuresing with Lasix.  She is -3.7 L over the past 24 hours.  Currently on milrinone.  Monitor creatinine     A. fib/flutter.  Continue Lopressor for heart rate control.  Holding anticoagulation due to history of blood loss.  Greater than 50% was spent in counseling and coordination of care with patient Total encounter time 25 minutes or more      Signed, Kate Sable, MD  02/03/2019, 11:34 AM

## 2019-02-03 NOTE — Consult Note (Signed)
PHARMACY CONSULT NOTE  Pharmacy Consult for Electrolyte Monitoring and Replacement   Recent Labs: Potassium (mmol/L)  Date Value  02/03/2019 4.1  07/10/2013 3.6   Magnesium (mg/dL)  Date Value  02/02/2019 2.2  07/10/2013 1.5 (L)   Calcium (mg/dL)  Date Value  02/03/2019 8.7 (L)   Calcium, Total (mg/dL)  Date Value  07/10/2013 8.1 (L)   Albumin (g/dL)  Date Value  02/03/2019 3.3 (L)  06/15/2017 3.5 (L)  07/08/2013 1.7 (L)   Phosphorus (mg/dL)  Date Value  02/01/2019 2.9   Sodium (mmol/L)  Date Value  02/03/2019 133 (L)  12/24/2018 135  07/10/2013 134 (L)   Corrected Ca: 9.74 mg/dL  Assessment: 68 y.o. female with medical problems of chronic systolic congestive heart failure, coronary disease, atrial fibrillation requiring anticoagulation, diabetes, anemia, carotid artery disease with history of left carotid endarterectomy, previous stroke, hypertension, chronic kidney disease, was admitted on10/9/2020with shortness of breath worsening edema, acute kidney injury.   This patient is currently receiving the following: KCl 20 mEq po BID Furosemide 80mg  IV BID   Goal of Therapy:  Given cardiac history: Potassium 4.0 - 5.1 mmol/L Magnesium: 2.0 - 2.4 mg/dL Other electrolytes WNL  Plan:   Continue potassium 20 mEq PO BID  No additional electrolyte supplementation warranted at this time  Recheck electrolytes with am labs.  Dallie Piles, PharmD 02/03/2019 1:36 PM

## 2019-02-03 NOTE — Progress Notes (Signed)
[place] [facility] [place] NAME: Meghan [name]    MR#:  [id number]  DATE OF BIRTH:  Apr 09, 1951  SUBJECTIVE:   She feels pretty good this morning.  She denies any shortness of breath.  She feels like her lower extremity edema is improving.  She is urinating a lot.  She denies any chest pain.  REVIEW OF SYSTEMS:   Review of Systems  Constitutional: Negative for chills, fever and weight loss.  HENT: Negative for ear discharge, ear pain and nosebleeds.   Eyes: Negative for blurred vision, pain and discharge.  Respiratory: Positive for shortness of breath. Negative for sputum production, wheezing and stridor.   Cardiovascular: Negative for chest pain, palpitations, orthopnea and PND.  Gastrointestinal: Negative for abdominal pain, diarrhea, nausea and vomiting.  Genitourinary: Negative for frequency and urgency.  Musculoskeletal: Negative for back pain and joint pain.  Neurological: Negative for sensory change, speech change, focal weakness and weakness.  Psychiatric/Behavioral: Negative for depression and hallucinations. The patient is not nervous/anxious.    DRUG ALLERGIES:   Allergies  Allergen Reactions  . Spironolactone     Hyperkalemia     VITALS:  Blood pressure 111/74, pulse 64, temperature 97.8 F (36.6 C), temperature source Oral, resp. rate 16, height 5\' 7"  (1.702 m), weight 118.1 kg, SpO2 95 %.  PHYSICAL EXAMINATION:   Physical Exam  GENERAL:  68 y.o.-year-old patient lying in the bed with no acute distress. obese EYES: Pupils equal, round, reactive to light and accommodation. No scleral icterus. Extraocular muscles intact.  HEENT: Head atraumatic, normocephalic. Oropharynx and nasopharynx clear.  NECK:  Supple, no jugular venous distention. No thyroid enlargement, no tenderness.  LUNGS: + Diminished breath sounds in the lung bases bilaterally.  Nasal cannula in place.. No use of accessory muscles of respiration.   CARDIOVASCULAR: RRR, S1, S2 normal. No murmurs, rubs, or gallops.  ABDOMEN: Soft, nontender, nondistended. Bowel sounds present. No organomegaly or mass.  EXTREMITIES: No cyanosis, clubbing. 2+ pitting edema in the lower extremities bilaterally. NEUROLOGIC: Cranial nerves II through XII are intact. No focal Motor or sensory deficits b/l.   PSYCHIATRIC:  patient is alert and oriented x 3.  SKIN: No obvious rash, lesion, or ulcer.   LABORATORY PANEL:  CBC Recent Labs  Lab 02/02/19 0422  WBC 6.8  HGB 8.1*  HCT 26.4*  PLT 205    Chemistries  Recent Labs  Lab 02/02/19 0422 02/03/19 0552  NA 134* 133*  K 3.8 4.1  CL 91* 90*  CO2 31 31  GLUCOSE 204* 162*  BUN 56* 54*  CREATININE 2.05* 1.86*  CALCIUM 8.6* 8.7*  MG 2.2  --    Cardiac Enzymes No results for input(s): TROPONINI in the last 168 hours. RADIOLOGY:  No results found. ASSESSMENT AND PLAN:   Acute hypoxic respiratory failure secondary to acute on chronic systolic and diastolic congestive heart failure- net -3.7L in the last 24 hours. Patient remains on 2 L O2 this morning. -ECHO with EF 25-30% -Continue milrinone drip and Lasix 80 mg IV twice daily -Receiving daily IV albumin -Cardiology following  -Wean O2 as able  Melena- resolved -S/p vitamin K x1 -Patient was supposed to have EGD on 01/25/2019, but patient became hypoxemic -Will need bleeding scan if patient starts bleeding again -Will need to follow-up with GI as an outpatient  Acute blood loss anemia- hemoglobin low but stable -s/p multiple units PRBC -Monitor  AKI in CKD III- due to cardiorenal syndrome. Creatinine improving  over the last couple of days. -Nephrology following  Persistent A. fib s/p cardioversion -Holding Coumadin -Off amiodarone -Continue metoprolol 25 mg twice daily  Type 2 diabetes -Continue Lantus and SSI  History of CVA -Holding Coumadin in the setting of GI bleed  Case discussed with Care Management/Social  Worker. Management plans discussed with the patient, family and they are in agreement.  CODE STATUS: full  DVT Prophylaxis: SCD  TOTAL TIME TAKING CARE OF THIS PATIENT: 36 minutes.  >50% time spent on counselling and coordination of care  POSSIBLE D/C IN 3-4 DAYS, DEPENDING ON CLINICAL CONDITION.  Note: This dictation was prepared with Dragon dictation along with smaller phrase technology. Any transcriptional errors that result from this process are unintentional.  Evette Doffing M.D on 02/03/2019 at 4:41 PM  Between 7am to 6pm - Pager - [phone]  After 6pm go to [email or web address] - password [email or web address] [facility]  Office  [phone]  CC: Primary care physician; [name], MDPatient ID: Meghan [name], female   DOB: 1950-08-31, 68 y.o.   MRN: [id number]

## 2019-02-03 NOTE — Progress Notes (Signed)
Nutrition Follow-up  DOCUMENTATION CODES:   Morbid obesity  INTERVENTION:  Continue double protein with lunch and dinner.  Encouraged adequate intake at meals in setting of increased needs. Patient continues to decline oral nutrition supplements.  Continue daily MVI.   NUTRITION DIAGNOSIS:   Increased nutrient needs related to chronic illness(CHF, morbid obesity) as evidenced by estimated needs.  Ongoing.  GOAL:   Patient will meet greater than or equal to 90% of their needs  Progressing.  MONITOR:   PO intake, Supplement acceptance, Weight trends, Labs, Skin, I & O's  REASON FOR ASSESSMENT:   LOS    ASSESSMENT:   68 y.o. white female with chronic systolic congestive heart failure, coronary disease, atrial fibrillation on anticoagulation, diabetes mellitus type II, anemia, carotid artery disease with history of left carotid endarterectomy, stroke, hypertension, chronic kidney disease stage III, was admitted on 01/17/2019 with shortness of breath worsening edema, acute kidney injury.  Met with patient at bedside. She reports her appetite is good and she is eating well at meals. She is finishing 75-100% of her meals. However, the meals she is ordering are not high enough in calories/protein to meet her elevated needs. Encouraged her to drink an oral nutrition supplement but she continues to refuse one. She is supposed to be receiving double protein portions on her trays but is not ordering the type of protein that can be doubled in setting of her restricted diet.  Medications reviewed and include: Novolog 0-9 units TID, Novolog 0-5 units QHS, Novolog 8 units TID with meals, Lantus 25 units QHS, levothyroxine, MVI daily, pantoprazole, potassium chloride 20 mEq BID, prednisone 20 mg daily, human albumin 12.5 grams IV daily, milrinone gtt.  Labs reviewed: CBG 143-302, Sodium 133, Chloride 90, BUN 54, Creatinine 1.86.  Diet Order:   Diet Order            Diet heart  healthy/carb modified Room service appropriate? Yes; Fluid consistency: Thin  Diet effective now             EDUCATION NEEDS:   Education needs have been addressed  Skin:  Skin Assessment: Skin Integrity Issues:(MSAD to groin)  Last BM:  02/03/2019 - small type 6  Height:   Ht Readings from Last 1 Encounters:  01/17/19 5' 7"  (1.702 m)   Weight:   Wt Readings from Last 1 Encounters:  02/02/19 118.1 kg   Ideal Body Weight:  61.36 kg  BMI:  Body mass index is 40.78 kg/m.  Estimated Nutritional Needs:   Kcal:  2200-2500kcal/day  Protein:  110-125g/day  Fluid:  1.5-1.8L/day  Willey Blade, MS, RD, LDN Office: [phone] Pager: [phone] After Hours/Weekend Pager: [phone]

## 2019-02-03 NOTE — Progress Notes (Signed)
Inpatient Diabetes Program Recommendations  AACE/ADA: New Consensus Statement on Inpatient Glycemic Control (2015)  Target Ranges:  Prepandial:   less than 140 mg/dL      Peak postprandial:   less than 180 mg/dL (1-2 hours)      Critically ill patients:  140 - 180 mg/dL   Results for Meghan [name], Meghan [name] (MRN [id number]) as of 02/03/2019 11:51  Ref. Range 02/02/2019 07:50 02/02/2019 11:54 02/02/2019 16:22 02/02/2019 21:36  Glucose-Capillary Latest Ref Range: 70 - 99 mg/dL 182 (H)  10 units NOVOLOG  178 (H)  10 units NOVOLOG  263 (H)  13 units NOVOLOG  302 (H)  4 units NOVOLOG +  25 units LANTUS   Results for Meghan [name], Meghan [name] (MRN [id number]) as of 02/03/2019 11:51  Ref. Range 02/03/2019 07:43 02/03/2019 11:22  Glucose-Capillary Latest Ref Range: 70 - 99 mg/dL 143 (H)  9 units NOVOLOG  224 (H)    Home DM Meds: Glipizide 10 mg BID       Tresiba 25 units Daily   Current: Orders: Lantus 25 units QHS        Novolog Sensitive Correction Scale/ SSI (0-9 units) TID AC + HS       Novolog 8 units TID with meals      Pt getting Prednisone 20 mg Daily.   Lantus increased last PM.   Novolog Meal Coverage increased yest at 8am w/ Breakfast.    MD- Please consider further increasing Novolog Meal Coverage to:  Novolog 10 units TID with meals  (Please add the following Hold Parameters: Hold if pt eats <50% of meal, Hold if pt NPO)      --Will follow patient during hospitalization--  Wyn Quaker RN, MSN, CDE Diabetes Coordinator Inpatient Glycemic Control Team Team Pager: [phone] (8a-5p)

## 2019-02-03 NOTE — Progress Notes (Signed)
Pt a&o x4, vss.  Up to bsc with minimal 1+ assist.  Pt is very weak.  GOod UOP noted.  Remains on strict I&O and milrinone drip.

## 2019-02-03 NOTE — Progress Notes (Signed)
[facility], [place] 02/03/19  Subjective:   Patient is doing fair Diuresing well with IV Lasix Able to eat without nausea or vomiting 10/25 0701 - 10/26 0700 In: 434.7 [P.O.:240; I.V.:194.7] Out: [id number] [Urine:4150; Stool:1]   Objective:  Vital signs in last 24 hours:  Temp:  [97.5 F (36.4 C)-98.3 F (36.8 C)] 97.7 F (36.5 C) (10/26 0744) Pulse Rate:  [30-105] 101 (10/26 0900) Resp:  [12-23] 19 (10/26 0900) BP: (108-144)/(62-90) 129/74 (10/26 0800) SpO2:  [94 %-100 %] 95 % (10/26 0900) FiO2 (%):  [28 %] 28 % (10/25 2008)  Weight change:  Filed Weights   01/31/19 0500 02/01/19 0402 02/02/19 [id number]  Weight: 127.9 kg 129.3 kg 118.1 kg    Intake/Output:    Intake/Output Summary (Last 24 hours) at 02/03/2019 0935 Last data filed at 02/03/2019 0429 Gross per 24 hour  Intake 294.68 ml  Output 4151 ml  Net -3856.32 ml     Physical Exam: General:  Sitting up in the bed, no acute distress  HEENT  moist oral mucous membranes  Pulm/lungs  mild bilateral crackles, [place] O2  CVS/Heart  atrial flutter, irregular rhythm  Abdomen:   Soft, nontender  Extremities:  1-2+ pitting edema bilaterally  Neurologic:  Alert, oriented  Skin:  Warm, dry    Basic Metabolic Panel:  Recent Labs  Lab 01/28/19 0523 01/29/19 0453 01/30/19 0536 01/31/19 0545 02/01/19 0425 02/02/19 0422 02/03/19 0552  NA 132* 135 134* 134* 134* 134* 133*  K 4.1 3.9 4.3 4.2 4.2 3.8 4.1  CL 92* 94* 94* 93* 92* 91* 90*  CO2 29 30 31 31  33* 31 31  GLUCOSE 235* 231* 225* 211* 224* 204* 162*  BUN 58* 58* 57* 57* 61* 56* 54*  CREATININE 2.20* 2.10* 2.01* 1.87* 2.03* 2.05* 1.86*  CALCIUM 8.4* 8.4* 8.5* 8.7* 8.8* 8.6* 8.7*  MG 2.1 2.0 2.2  --  2.2 2.2  --   PHOS  --   --   --   --  2.9  --   --      CBC: Recent Labs  Lab 01/28/19 0523  01/29/19 0211 01/29/19 0453 01/29/19 1352 02/01/19 0425 02/02/19 0422  WBC 7.5  --   --  6.8  --  7.3 6.8  NEUTROABS  --   --   --   --    --  5.4 5.0  HGB 8.8*   < > 8.3* 8.1* 8.2* 8.2* 8.1*  HCT 28.2*   < > 26.6* 25.8* 26.2* 26.8* 26.4*  MCV 85.2  --   --  84.3  --  85.4 84.6  PLT 184  --   --  192  --  221 205   < > = values in this interval not displayed.     No results found for: HEPBSAG, HEPBSAB, HEPBIGM    Microbiology:  No results found for this or any previous visit (from the past 240 hour(s)).  Coagulation Studies: Recent Labs    02/01/19 0425 02/02/19 0422 02/03/19 0552  LABPROT 14.5 14.2 14.8  INR 1.1 1.1 1.2    Urinalysis: No results for input(s): COLORURINE, LABSPEC, PHURINE, GLUCOSEU, HGBUR, BILIRUBINUR, KETONESUR, PROTEINUR, UROBILINOGEN, NITRITE, LEUKOCYTESUR in the last 72 hours.  Invalid input(s): APPERANCEUR    Imaging: Dg Chest Port 1 View  Result Date: 02/01/2019 CLINICAL DATA:  Pulmonary edema EXAM: PORTABLE CHEST 1 VIEW COMPARISON:  01/25/2019 FINDINGS: Gross cardiomegaly. Mild, diffuse interstitial pulmonary opacity and pulmonary vascular prominence, not significantly changed compared to  prior examination. The visualized skeletal structures are unremarkable. IMPRESSION: 1. Mild, diffuse interstitial pulmonary opacity and pulmonary vascular prominence, not significantly changed compared to prior examination. Findings are most consistent with edema. No new airspace opacity. 2.  Gross cardiomegaly. Electronically Signed   By: Eddie Candle M.D.   On: 02/01/2019 14:03     Medications:   . sodium chloride 0 mL (01/17/19 1010)  . albumin human 12.5 g (02/02/19 0826)  . milrinone 0.25 mcg/kg/min (02/03/19 0742)   . sodium chloride   Intravenous Once  . atorvastatin  40 mg Oral Daily  . Chlorhexidine Gluconate Cloth  6 each Topical Q0600  . furosemide  80 mg Intravenous BID  . insulin aspart  0-5 Units Subcutaneous QHS  . insulin aspart  0-9 Units Subcutaneous TID WC  . insulin aspart  8 Units Subcutaneous TID WC  . insulin glargine  25 Units Subcutaneous QHS  . levothyroxine  50  mcg Oral QAC breakfast  . metoprolol tartrate  25 mg Oral BID  . multivitamin with minerals  1 tablet Oral Daily  . pantoprazole  40 mg Intravenous Q12H  . potassium chloride  20 mEq Oral BID  . predniSONE  20 mg Oral Q breakfast  . sodium chloride flush  3 mL Intravenous Q12H   sodium chloride, acetaminophen, HYDROcodone-acetaminophen, ondansetron (ZOFRAN) IV, polyethylene glycol, sodium chloride flush  Assessment/ Plan:  68 y.o. Caucasian female with morbid obesity,chronic systolic congestive heart failure, coronary disease, atrial fibrillation requiring anticoagulation, diabetes, anemia, carotid artery disease with history of left carotid endarterectomy, previous stroke, hypertension, chronic kidney disease, was admitted on 01/17/2019 with shortness of breath worsening edema, acute kidney injury.   1.  Acute kidney injury 2.  Chronic kidney disease stage IIIb. Baseline Cr 1.70/gfr 30 from 12/15/2018 3.   Pulmonary edema-does not use oxygen at home 4.  Lower extremity edema 5.  Severe anemia 6.  Diabetes, insulin-dependent.  Hemoglobin A1c 7.4% December 14, 2018 7.  Hematuria 8.  Proteinuria 9.  Acute on  chronic systolic congestive heart failure LVEF 25 to 30% 10.  Pulmonary hypertension 11.  Atrial fibrillation requiring anticoagulation 12.  Hyponatremia  Abdominal imaging - diffuse lumbar spine degeneration, fat stranding around pancreas from generalized volume overload, ascites, cardiomegaly and atherosclerosis  2D echo from September 5 shows severely reduced LVEF 20 to 30% mildly dilated left ventricular cavity, diffuse hypokinesis, moderately enlarged right ventricular cavity with elevated pulmonary pressures of 56 mm, severely dilated left atrium, moderately dilated right atrium, moderate tricuspid regurgitation  Acute kidney injury is likely secondary to cardiorenal syndrome.  Patient has relative hypotension which may be leading to renal hypoperfusion.  She has underlying  chronic kidney disease secondary to diabetes and hypertension.  Patient is also noted to have severe chronic congestive heart failure and hypoalbuminemia.  Recommend: -Good response to IV furosemide -With pulmonary congestion, pulmonary edema by clinical exam, continue IV Lasix for now. -Albumin of 3.1 on October 22.  We will repeat levels today.  Patient is currently getting albumin IV daily.  Will reevaluate continuation based on results from today. -Milrinone infusion as per cardiology team     LOS: [place] 10/26/20209:35 AM  [place], [place]  Note: This note was prepared with Dragon dictation. Any transcription errors are unintentional

## 2019-02-04 DIAGNOSIS — I5023 Acute on chronic systolic (congestive) heart failure: Secondary | ICD-10-CM | POA: Diagnosis not present

## 2019-02-04 DIAGNOSIS — I2510 Atherosclerotic heart disease of native coronary artery without angina pectoris: Secondary | ICD-10-CM | POA: Diagnosis not present

## 2019-02-04 DIAGNOSIS — I4892 Unspecified atrial flutter: Secondary | ICD-10-CM

## 2019-02-04 DIAGNOSIS — N183 Chronic kidney disease, stage 3 unspecified: Secondary | ICD-10-CM

## 2019-02-04 DIAGNOSIS — D62 Acute posthemorrhagic anemia: Secondary | ICD-10-CM | POA: Diagnosis not present

## 2019-02-04 LAB — CBC
HCT: 26.4 % — ABNORMAL LOW (ref 36.0–46.0)
Hemoglobin: 8.3 g/dL — ABNORMAL LOW (ref 12.0–15.0)
MCH: 26.3 pg (ref 26.0–34.0)
MCHC: 31.4 g/dL (ref 30.0–36.0)
MCV: 83.5 fL (ref 80.0–100.0)
Platelets: 198 10*3/uL (ref 150–400)
RBC: 3.16 MIL/uL — ABNORMAL LOW (ref 3.87–5.11)
RDW: 18.7 % — ABNORMAL HIGH (ref 11.5–15.5)
WBC: 7.1 10*3/uL (ref 4.0–10.5)
nRBC: 0 % (ref 0.0–0.2)

## 2019-02-04 LAB — BASIC METABOLIC PANEL
Anion gap: 8 (ref 5–15)
BUN: 54 mg/dL — ABNORMAL HIGH (ref 8–23)
CO2: 36 mmol/L — ABNORMAL HIGH (ref 22–32)
Calcium: 8.9 mg/dL (ref 8.9–10.3)
Chloride: 91 mmol/L — ABNORMAL LOW (ref 98–111)
Creatinine, Ser: 1.8 mg/dL — ABNORMAL HIGH (ref 0.44–1.00)
GFR calc Af Amer: 32 mL/min — ABNORMAL LOW (ref >60)
GFR calc non Af Amer: 28 mL/min — ABNORMAL LOW (ref >60)
Glucose, Bld: 139 mg/dL — ABNORMAL HIGH (ref 70–99)
Potassium: 4.2 mmol/L (ref 3.5–5.1)
Sodium: 135 mmol/L (ref 135–145)

## 2019-02-04 LAB — GLUCOSE, CAPILLARY
Glucose-Capillary: 107 mg/dL — ABNORMAL HIGH (ref 70–99)
Glucose-Capillary: 271 mg/dL — ABNORMAL HIGH (ref 70–99)
Glucose-Capillary: 400 mg/dL — ABNORMAL HIGH (ref 70–99)
Glucose-Capillary: 303 mg/dL — ABNORMAL HIGH (ref 70–99)

## 2019-02-04 LAB — MAGNESIUM: Magnesium: 2.2 mg/dL (ref 1.7–2.4)

## 2019-02-04 LAB — PROTIME-INR
INR: 1.1 (ref 0.8–1.2)
Prothrombin Time: 14.2 seconds (ref 11.4–15.2)

## 2019-02-04 LAB — PHOSPHORUS: Phosphorus: 3.5 mg/dL (ref 2.5–4.6)

## 2019-02-04 MED ORDER — METOPROLOL SUCCINATE ER 50 MG PO TB24
25.0000 mg | ORAL_TABLET | Freq: Every day | ORAL | Status: DC
  Administered 2019-02-04 – 2019-02-05 (×2): 25 mg via ORAL
  Filled 2019-02-04 (×2): qty 1

## 2019-02-04 MED ORDER — LOPERAMIDE HCL 2 MG PO CAPS
2.0000 mg | ORAL_CAPSULE | ORAL | Status: DC | PRN
  Administered 2019-02-04: 22:00:00 2 mg via ORAL
  Filled 2019-02-04: qty 1

## 2019-02-04 NOTE — Consult Note (Signed)
PHARMACY CONSULT NOTE  Pharmacy Consult for Electrolyte Monitoring and Replacement   Recent Labs: Potassium (mmol/L)  Date Value  02/04/2019 4.2  07/10/2013 3.6   Magnesium (mg/dL)  Date Value  02/04/2019 2.2  07/10/2013 1.5 (L)   Calcium (mg/dL)  Date Value  02/04/2019 8.9   Calcium, Total (mg/dL)  Date Value  07/10/2013 8.1 (L)   Albumin (g/dL)  Date Value  02/03/2019 3.3 (L)  06/15/2017 3.5 (L)  07/08/2013 1.7 (L)   Phosphorus (mg/dL)  Date Value  02/04/2019 3.5   Sodium (mmol/L)  Date Value  02/04/2019 135  12/24/2018 135  07/10/2013 134 (L)   Corrected Ca: 9.74 mg/dL  Assessment: 68 y.o. female with medical problems of chronic systolic congestive heart failure, coronary disease, atrial fibrillation requiring anticoagulation, diabetes, anemia, carotid artery disease with history of left carotid endarterectomy, previous stroke, hypertension, chronic kidney disease, was admitted on10/9/2020with shortness of breath worsening edema, acute kidney injury.   This patient is currently receiving the following: KCl 20 mEq po BID Furosemide 80mg  IV BID   Goal of Therapy:  Given cardiac history: Potassium 4.0 - 5.1 mmol/L Magnesium: 2.0 - 2.4 mg/dL Other electrolytes WNL  Plan:   Continue potassium 20 mEq PO BID  No additional electrolyte supplementation warranted at this time  Recheck electrolytes with am labs.  Dallie Piles, PharmD 02/04/2019 7:25 AM

## 2019-02-04 NOTE — Progress Notes (Signed)
Progress Note  Patient Name: Meghan [name] Date of Encounter: 02/04/2019  Primary Cardiologist: Kathlyn Sacramento, MD   Subjective   Feels well.  No chest pain, shortness of breath, or palpitations.  Edema improving, though less urine output yesterday noted.  Inpatient Medications    Scheduled Meds: . sodium chloride   Intravenous Once  . atorvastatin  40 mg Oral Daily  . Chlorhexidine Gluconate Cloth  6 each Topical Q0600  . furosemide  80 mg Intravenous BID  . insulin aspart  0-5 Units Subcutaneous QHS  . insulin aspart  0-9 Units Subcutaneous TID WC  . insulin aspart  10 Units Subcutaneous TID WC  . insulin glargine  25 Units Subcutaneous QHS  . levothyroxine  50 mcg Oral QAC breakfast  . metoprolol tartrate  25 mg Oral BID  . multivitamin with minerals  1 tablet Oral Daily  . pantoprazole  40 mg Intravenous Q12H  . potassium chloride  20 mEq Oral BID  . predniSONE  20 mg Oral Q breakfast  . sodium chloride flush  3 mL Intravenous Q12H   Continuous Infusions: . sodium chloride 0 mL (01/17/19 1010)  . milrinone 0.25 mcg/kg/min (02/04/19 0847)   PRN Meds: sodium chloride, acetaminophen, HYDROcodone-acetaminophen, ondansetron (ZOFRAN) IV, polyethylene glycol, sodium chloride flush   Vital Signs    Vitals:   02/04/19 0600 02/04/19 0700 02/04/19 0800 02/04/19 0900  BP: 108/68  113/80   Pulse: (!) 35 (!) 42 (!) 57 (!) 45  Resp: 17 19 20 17   Temp:   98 F (36.7 C)   TempSrc:   Oral   SpO2: 98% 99% 96% 97%  Weight: 123.5 kg     Height:        Intake/Output Summary (Last 24 hours) at 02/04/2019 0943 Last data filed at 02/04/2019 0900 Gross per 24 hour  Intake 978.79 ml  Output 1675 ml  Net -696.21 ml   Last 3 Weights 02/04/2019 02/02/2019 02/01/2019  Weight (lbs) 272 lb 4.3 oz 260 lb 5.8 oz 285 lb 0.9 oz  Weight (kg) 123.5 kg 118.1 kg 129.3 kg      Telemetry    Atrial flutter with variable block.  PVC's versus aberrancy - Personally Reviewed  ECG    No  new tracing  Physical Exam   GEN: No acute distress.   Neck: JVP 10-12 cm with positive HJR. Cardiac: Irregular with 3/6 systolic murmur. Respiratory: Bibasilar crackles.  Fair air movement. GI: Soft, nontender, non-distended  MS: 1+ pretibial edema bilaterally Neuro:  Nonfocal  Psych: Normal affect   Labs    High Sensitivity Troponin:   Recent Labs  Lab 01/17/19 0011  TROPONINIHS 13      Chemistry Recent Labs  Lab 01/30/19 0536  02/02/19 0422 02/03/19 0552 02/04/19 0345  NA 134*   < > 134* 133* 135  K 4.3   < > 3.8 4.1 4.2  CL 94*   < > 91* 90* 91*  CO2 31   < > 31 31 36*  GLUCOSE 225*   < > 204* 162* 139*  BUN 57*   < > 56* 54* 54*  CREATININE 2.01*   < > 2.05* 1.86* 1.80*  CALCIUM 8.5*   < > 8.6* 8.7* 8.9  ALBUMIN 3.1*  --   --  3.3*  --   GFRNONAA 25*   < > 24* 27* 28*  GFRAA 28*   < > 28* 31* 32*  ANIONGAP 9   < > 12 12 8    < > =  values in this interval not displayed.     Hematology Recent Labs  Lab 02/01/19 0425 02/02/19 0422 02/04/19 0345  WBC 7.3 6.8 7.1  RBC 3.14* 3.12* 3.16*  HGB 8.2* 8.1* 8.3*  HCT 26.8* 26.4* 26.4*  MCV 85.4 84.6 83.5  MCH 26.1 26.0 26.3  MCHC 30.6 30.7 31.4  RDW 19.2* 19.3* 18.7*  PLT 221 205 198    BNP Recent Labs  Lab 02/01/19 0425  BNP 563.0*     DDimer No results for input(s): DDIMER in the last 168 hours.   Radiology    No results found.  Cardiac Studies   TTE (12/14/2018):  1. The left ventricle has severely reduced systolic function, with an ejection fraction of 25-30%. The cavity size was mildly dilated. Left ventricular diastolic Doppler parameters are indeterminate. Left ventricular diffuse hypokinesis.  2. The right ventricle has mildly reduced systolic function. The cavity was moderately enlarged. There is no increase in right ventricular wall thickness. Right ventricular systolic pressure is moderately elevated with an estimated pressure of 56.2  mmHg.  3. Left atrial size was severely dilated.   4. Right atrial size was moderately dilated.  5. Tricuspid valve regurgitation is moderate.  6. Rhythm is atrial fibrillation  Patient Profile     68 y.o. female with a history of CAD, HFrEF secondary to ICM, prior hyperkalemia with spironolactone in the setting of renal failure, persistent atrial fibrillation s/p recent DCCV 01/03/2019 that was briefly successful on amiodarone and Coumadin, CVA, anemia, carotid artery disease s/p left-sided CEA, DM, hypertension, and hyperlipidemia, admitted with acute on chronic HFrEF complicated by atrial fibrillation with rapid ventricular response and acute blood loss anemia due to GI bleed.  Assessment & Plan    Acute on chronic HFrEF: Patient continues to improve symptomatically but still appears volume overloaded.  UOP less brisk yesterday.  Recorded weight up 5 pounds in the last 2 days, though I suspect these are inaccurate in the setting of bed scale use.  Renal function stable.  Continue milrinone 0.25 mcg/kg/min.  Continue furosemide 80 mg IV BID.  If urine output continues to diminish, consider augmentation with metolazone.  Transition metoprolol tartrate to succinate.  Defer addition of ACE/ARB/ANRI and AA in the setting of acute on chronic kidney failure and intermittently soft blood pressures.  If renal function were to worsen in spite of continued volume overload, RHC may be needed to better assess hemodynamic.  Atrial flutter: Rate controlled; asymptomatic.  Transition to metoprolol succinate.  Defer anticoagulation given recent acute blood loss anemia from presumed upper GI bleed.  Coronary artery disease: No active symptoms.  Metoprolol and atorvastatin.  Defer antiplatelet therapy in the setting of anemia/GI bleed.  Acute blood loss anemia: Stable anemia.  Defer anticoagulation/antiplatelet therapy at this time.  Continue PPI.  Transfuse PRBC if symptomatic or hemoglobin drops below 8.  Acute on chronic kidney  failure: Renal function improving with milrinone and diuresis.  Continue current doses of milrinone and furosemide.  Avoid nephrotoxic drugs.  For questions or updates, please contact [place] Please consult [email or web address] for contact info under [facility] Cardiology.    Signed, Nelva Bush, MD  02/04/2019, 9:43 AM

## 2019-02-04 NOTE — Progress Notes (Signed)
[facility], [place] 02/04/19  Subjective:   Patient is doing fair Diuresing well with IV Lasix Able to eat without nausea or vomiting States she is able to sit in the chair during the day Remains on milrinone drip No chest pain or SOB O2 by [place]  10/26 0701 - 10/27 0700 In: 358.7 [P.O.:180; I.V.:178.7] Out: 2075 [Urine:2075]   Objective:  Vital signs in last 24 hours:  Temp:  [97.6 F (36.4 C)-98 F (36.7 C)] 98 F (36.7 C) (10/27 0800) Pulse Rate:  [25-118] 57 (10/27 0800) Resp:  [13-20] 20 (10/27 0800) BP: (103-142)/(51-94) 113/80 (10/27 0800) SpO2:  [83 %-100 %] 96 % (10/27 0800) Weight:  [123.5 kg] 123.5 kg (10/27 0600)  Weight change:  Filed Weights   02/01/19 0402 02/02/19 0633 02/04/19 0600  Weight: 129.3 kg 118.1 kg 123.5 kg    Intake/Output:    Intake/Output Summary (Last 24 hours) at 02/04/2019 0849 Last data filed at 02/04/2019 0847 Gross per 24 hour  Intake 498.79 ml  Output 2075 ml  Net -1576.21 ml     Physical Exam: General:  Sitting up in the bed, no acute distress  HEENT  moist oral mucous membranes  Pulm/lungs  mild bilateral crackles at bases, [place] O2  CVS/Heart  atrial flutter, irregular rhythm  Abdomen:   Soft, nontender  Extremities:  1-2+ pitting edema bilaterally  Neurologic:  Alert, oriented  Skin:  Warm, dry    Basic Metabolic Panel:  Recent Labs  Lab 01/29/19 0453 01/30/19 0536 01/31/19 0545 02/01/19 0425 02/02/19 0422 02/03/19 0552 02/04/19 0345  NA 135 134* 134* 134* 134* 133* 135  K 3.9 4.3 4.2 4.2 3.8 4.1 4.2  CL 94* 94* 93* 92* 91* 90* 91*  CO2 30 31 31  33* 31 31 36*  GLUCOSE 231* 225* 211* 224* 204* 162* 139*  BUN 58* 57* 57* 61* 56* 54* 54*  CREATININE 2.10* 2.01* 1.87* 2.03* 2.05* 1.86* 1.80*  CALCIUM 8.4* 8.5* 8.7* 8.8* 8.6* 8.7* 8.9  MG 2.0 2.2  --  2.2 2.2  --  2.2  PHOS  --   --   --  2.9  --   --  3.5     CBC: Recent Labs  Lab 01/29/19 0453 01/29/19 1352  02/01/19 0425 02/02/19 0422 02/04/19 0345  WBC 6.8  --  7.3 6.8 7.1  NEUTROABS  --   --  5.4 5.0  --   HGB 8.1* 8.2* 8.2* 8.1* 8.3*  HCT 25.8* 26.2* 26.8* 26.4* 26.4*  MCV 84.3  --  85.4 84.6 83.5  PLT 192  --  221 205 198     No results found for: HEPBSAG, HEPBSAB, HEPBIGM    Microbiology:  No results found for this or any previous visit (from the past 240 hour(s)).  Coagulation Studies: Recent Labs    02/02/19 0422 02/03/19 0552 02/04/19 0345  LABPROT 14.2 14.8 14.2  INR 1.1 1.2 1.1    Urinalysis: No results for input(s): COLORURINE, LABSPEC, PHURINE, GLUCOSEU, HGBUR, BILIRUBINUR, KETONESUR, PROTEINUR, UROBILINOGEN, NITRITE, LEUKOCYTESUR in the last 72 hours.  Invalid input(s): APPERANCEUR    Imaging: No results found.   Medications:   . sodium chloride 0 mL (01/17/19 1010)  . albumin human 12.5 g (02/03/19 1032)  . milrinone 0.25 mcg/kg/min (02/04/19 0847)   . sodium chloride   Intravenous Once  . atorvastatin  40 mg Oral Daily  . Chlorhexidine Gluconate Cloth  6 each Topical Q0600  . furosemide  80 mg Intravenous  BID  . insulin aspart  0-5 Units Subcutaneous QHS  . insulin aspart  0-9 Units Subcutaneous TID WC  . insulin aspart  10 Units Subcutaneous TID WC  . insulin glargine  25 Units Subcutaneous QHS  . levothyroxine  50 mcg Oral QAC breakfast  . metoprolol tartrate  25 mg Oral BID  . multivitamin with minerals  1 tablet Oral Daily  . pantoprazole  40 mg Intravenous Q12H  . potassium chloride  20 mEq Oral BID  . predniSONE  20 mg Oral Q breakfast  . sodium chloride flush  3 mL Intravenous Q12H   sodium chloride, acetaminophen, HYDROcodone-acetaminophen, ondansetron (ZOFRAN) IV, polyethylene glycol, sodium chloride flush  Assessment/ Plan:  68 y.o. Caucasian female with morbid obesity,chronic systolic congestive heart failure, coronary disease, atrial fibrillation requiring anticoagulation, diabetes, anemia, carotid artery disease with history of  left carotid endarterectomy, previous stroke, hypertension, chronic kidney disease, was admitted on 01/17/2019 with shortness of breath worsening edema, acute kidney injury.   1.  Acute kidney injury 2.  Chronic kidney disease stage IIIb. Baseline Cr 1.70/gfr 30 from 12/15/2018 3.   Pulmonary edema-does not use oxygen at home 4.  Lower extremity edema 5.  Severe anemia 6.  Diabetes, insulin-dependent.  Hemoglobin A1c 7.4% December 14, 2018 7.  Hematuria 8.  Proteinuria 9.  Acute on  chronic systolic congestive heart failure LVEF 25 to 30% 10.  Pulmonary hypertension 11.  Atrial fibrillation requiring anticoagulation 12.  Hyponatremia  Abdominal imaging - diffuse lumbar spine degeneration, fat stranding around pancreas from generalized volume overload, ascites, cardiomegaly and atherosclerosis  2D echo from September 5 shows severely reduced LVEF 20 to 30% mildly dilated left ventricular cavity, diffuse hypokinesis, moderately enlarged right ventricular cavity with elevated pulmonary pressures of 56 mm, severely dilated left atrium, moderately dilated right atrium, moderate tricuspid regurgitation  Acute kidney injury is likely secondary to cardiorenal syndrome.  Patient has relative hypotension which may be leading to renal hypoperfusion.  She has underlying chronic kidney disease secondary to diabetes and hypertension.  Patient is also noted to have severe chronic congestive heart failure and hypoalbuminemia.  Recommend: -Good response to IV furosemide, continue for another day -Albumin of 3.3 on October 26.  May d/c -Milrinone infusion as per cardiology team - standing weight - patient reports Dry weight = 250 lbs     LOS: [place] 10/27/20208:49 AM  [place], [place]  Note: This note was prepared with Dragon dictation. Any transcription errors are unintentional

## 2019-02-04 NOTE — Progress Notes (Signed)
[facility] [place] NAME: Meghan [name]    MR#:  [id number]  DATE OF BIRTH:  Aug 01, 1950  SUBJECTIVE:   Patient states she is feeling pretty good this morning.  She denies any chest pain, shortness of breath, or abdominal pain.  She still feels like she is peeing a decent amount with IV Lasix.  She feels like her lower extremity edema continues to improve every day.  REVIEW OF SYSTEMS:   Review of Systems  Constitutional: Negative for chills, fever and weight loss.  HENT: Negative for ear discharge, ear pain and nosebleeds.   Eyes: Negative for blurred vision, pain and discharge.  Respiratory: Positive for shortness of breath. Negative for sputum production, wheezing and stridor.   Cardiovascular: Negative for chest pain, palpitations, orthopnea and PND.  Gastrointestinal: Negative for abdominal pain, diarrhea, nausea and vomiting.  Genitourinary: Negative for frequency and urgency.  Musculoskeletal: Negative for back pain and joint pain.  Neurological: Negative for sensory change, speech change, focal weakness and weakness.  Psychiatric/Behavioral: Negative for depression and hallucinations. The patient is not nervous/anxious.    DRUG ALLERGIES:   Allergies  Allergen Reactions  . Spironolactone     Hyperkalemia     VITALS:  Blood pressure (!) 114/96, pulse (!) 118, temperature 97.9 F (36.6 C), temperature source Oral, resp. rate 18, height 5\' 7"  (1.702 m), weight 123.1 kg, SpO2 98 %.  PHYSICAL EXAMINATION:   Physical Exam  GENERAL:  68 y.o.-year-old patient lying in the bed with no acute distress. obese EYES: Pupils equal, round, reactive to light and accommodation. No scleral icterus. Extraocular muscles intact.  HEENT: Head atraumatic, normocephalic. Oropharynx and nasopharynx clear.  NECK:  Supple, +jugular venous distention. No thyroid enlargement, no tenderness.  LUNGS: + Diminished breath sounds and crackles in the  lung bases bilaterally.  Nasal cannula in place. No use of accessory muscles of respiration.  CARDIOVASCULAR: RRR, S1, S2 normal. No murmurs, rubs, or gallops.  ABDOMEN: Soft, nontender, nondistended. Bowel sounds present. No organomegaly or mass.  EXTREMITIES: No cyanosis, clubbing. 1+ pitting edema in the lower extremities bilaterally, much improved from previous exam. NEUROLOGIC: Cranial nerves II through XII are intact. No focal Motor or sensory deficits b/l.   PSYCHIATRIC:  patient is alert and oriented x 3.  SKIN: No obvious rash, lesion, or ulcer.   LABORATORY PANEL:  CBC Recent Labs  Lab 02/04/19 0345  WBC 7.1  HGB 8.3*  HCT 26.4*  PLT 198    Chemistries  Recent Labs  Lab 02/04/19 0345  NA 135  K 4.2  CL 91*  CO2 36*  GLUCOSE 139*  BUN 54*  CREATININE 1.80*  CALCIUM 8.9  MG 2.2   Cardiac Enzymes No results for input(s): TROPONINI in the last 168 hours. RADIOLOGY:  No results found. ASSESSMENT AND PLAN:   Acute hypoxic respiratory failure secondary to acute on chronic systolic and diastolic congestive heart failure- UOP 2L in the last 24 hours. Patient is still on 2L O2. -ECHO with EF 25-30% -Continue milrinone drip and Lasix 80 mg IV twice daily -Receiving daily IV albumin -Cardiology following  -Continue metoprolol -No ACE/ARB due to AKI -Wean O2 as able  Melena- resolved -S/p vitamin K x1 -Patient was supposed to have EGD on 01/25/2019, but patient became hypoxemic -Will need bleeding scan if patient starts bleeding again -Will need to follow-up with GI as an outpatient  Acute blood loss anemia- hemoglobin low but stable -s/p multiple units  PRBC -Transfuse for Hgb < 8  AKI in CKD III- due to cardiorenal syndrome. Creatinine improving. -Nephrology following  Persistent A. fib s/p cardioversion -Holding Coumadin in the setting of GI bleed -Off amiodarone -Continue metoprolol   Type 2 diabetes -Continue Lantus and SSI  History of  CVA -Holding Coumadin in the setting of GI bleed  Case discussed with Care Management/Social Worker. Management plans discussed with the patient, family and they are in agreement.  CODE STATUS: full  DVT Prophylaxis: SCD  TOTAL TIME TAKING CARE OF THIS PATIENT: 38 minutes.  >50% time spent on counselling and coordination of care  POSSIBLE D/C IN 3-4 DAYS, DEPENDING ON CLINICAL CONDITION.  Note: This dictation was prepared with Dragon dictation along with smaller phrase technology. Any transcriptional errors that result from this process are unintentional.  Evette Doffing M.D on 02/04/2019 at 4:05 PM  Between 7am to 6pm - Pager - [phone]  After 6pm go to [email or web address] - password [email or web address]  Office  [phone]  CC: Primary care physician; [name], MDPatient ID: Meghan [name], female   DOB: 1950-12-26, 68 y.o.   MRN: [id number]

## 2019-02-05 DIAGNOSIS — I5023 Acute on chronic systolic (congestive) heart failure: Secondary | ICD-10-CM | POA: Diagnosis not present

## 2019-02-05 LAB — CBC
HCT: 27.4 % — ABNORMAL LOW (ref 36.0–46.0)
Hemoglobin: 8.5 g/dL — ABNORMAL LOW (ref 12.0–15.0)
MCH: 26 pg (ref 26.0–34.0)
MCHC: 31 g/dL (ref 30.0–36.0)
MCV: 83.8 fL (ref 80.0–100.0)
Platelets: 197 10*3/uL (ref 150–400)
RBC: 3.27 MIL/uL — ABNORMAL LOW (ref 3.87–5.11)
RDW: 18.6 % — ABNORMAL HIGH (ref 11.5–15.5)
WBC: 6.5 10*3/uL (ref 4.0–10.5)
nRBC: 0 % (ref 0.0–0.2)

## 2019-02-05 LAB — GLUCOSE, CAPILLARY
Glucose-Capillary: 160 mg/dL — ABNORMAL HIGH (ref 70–99)
Glucose-Capillary: 221 mg/dL — ABNORMAL HIGH (ref 70–99)
Glucose-Capillary: 358 mg/dL — ABNORMAL HIGH (ref 70–99)
Glucose-Capillary: 370 mg/dL — ABNORMAL HIGH (ref 70–99)

## 2019-02-05 LAB — BASIC METABOLIC PANEL
Anion gap: 10 (ref 5–15)
BUN: 54 mg/dL — ABNORMAL HIGH (ref 8–23)
CO2: 34 mmol/L — ABNORMAL HIGH (ref 22–32)
Calcium: 8.9 mg/dL (ref 8.9–10.3)
Chloride: 89 mmol/L — ABNORMAL LOW (ref 98–111)
Creatinine, Ser: 1.99 mg/dL — ABNORMAL HIGH (ref 0.44–1.00)
GFR calc Af Amer: 29 mL/min — ABNORMAL LOW (ref >60)
GFR calc non Af Amer: 25 mL/min — ABNORMAL LOW (ref >60)
Glucose, Bld: 207 mg/dL — ABNORMAL HIGH (ref 70–99)
Potassium: 4.3 mmol/L (ref 3.5–5.1)
Sodium: 133 mmol/L — ABNORMAL LOW (ref 135–145)

## 2019-02-05 LAB — PROTIME-INR
INR: 1.1 (ref 0.8–1.2)
Prothrombin Time: 14.3 seconds (ref 11.4–15.2)

## 2019-02-05 LAB — MAGNESIUM: Magnesium: 2.1 mg/dL (ref 1.7–2.4)

## 2019-02-05 MED ORDER — FUROSEMIDE 10 MG/ML IJ SOLN
40.0000 mg | Freq: Two times a day (BID) | INTRAMUSCULAR | Status: DC
  Administered 2019-02-05 – 2019-02-08 (×6): 40 mg via INTRAVENOUS
  Filled 2019-02-05 (×6): qty 4

## 2019-02-05 NOTE — Progress Notes (Signed)
[facility] [place] NAME: Meghan [name]    MR#:  [id number]  DATE OF BIRTH:  [birth year]  SUBJECTIVE:   Continues to feel well. Not urinating as much as previous days. Denies SOB or chest pain.  REVIEW OF SYSTEMS:   Review of Systems  Constitutional: Negative for chills, fever and weight loss.  HENT: Negative for ear discharge, ear pain and nosebleeds.   Eyes: Negative for blurred vision, pain and discharge.  Respiratory: Negative for sputum production, shortness of breath, wheezing and stridor.   Cardiovascular: Positive for leg swelling. Negative for chest pain, palpitations, orthopnea and PND.  Gastrointestinal: Negative for abdominal pain, diarrhea, nausea and vomiting.  Genitourinary: Negative for frequency and urgency.  Musculoskeletal: Negative for back pain and joint pain.  Neurological: Negative for sensory change, speech change, focal weakness and weakness.  Psychiatric/Behavioral: Negative for depression and hallucinations. The patient is not nervous/anxious.    DRUG ALLERGIES:   Allergies  Allergen Reactions  . Spironolactone     Hyperkalemia     VITALS:  Blood pressure 127/80, pulse (!) 122, temperature 98 F (36.7 C), temperature source Oral, resp. rate 17, height 5\' 7"  (1.702 m), weight 115.3 kg, SpO2 95 %.  PHYSICAL EXAMINATION:   Physical Exam  GENERAL:  68 y.o.-year-old patient lying in the bed with no acute distress. obese EYES: Pupils equal, round, reactive to light and accommodation. No scleral icterus. Extraocular muscles intact.  HEENT: Head atraumatic, normocephalic. Oropharynx and nasopharynx clear.  NECK:  Supple, +jugular venous distention. No thyroid enlargement, no tenderness.  LUNGS: + Diminished breath sounds in the lung bases bilaterally.  Nasal cannula in place. No use of accessory muscles of respiration.  CARDIOVASCULAR: RRR, S1, S2 normal. No murmurs, rubs, or gallops.  ABDOMEN: Soft,  nontender, nondistended. Bowel sounds present. No organomegaly or mass.  EXTREMITIES: No cyanosis, clubbing. 1+ pitting edema in the lower extremities bilaterally, much improved. NEUROLOGIC: Cranial nerves II through XII are intact. No focal Motor or sensory deficits b/l.   PSYCHIATRIC:  patient is alert and oriented x 3.  SKIN: No obvious rash, lesion, or ulcer.   LABORATORY PANEL:  CBC Recent Labs  Lab 02/05/19 0427  WBC 6.5  HGB 8.5*  HCT 27.4*  PLT 197    Chemistries  Recent Labs  Lab 02/05/19 0427  NA 133*  K 4.3  CL 89*  CO2 34*  GLUCOSE 207*  BUN 54*  CREATININE 1.99*  CALCIUM 8.9  MG 2.1   Cardiac Enzymes No results for input(s): TROPONINI in the last 168 hours. RADIOLOGY:  No results found. ASSESSMENT AND PLAN:   Acute hypoxic respiratory failure secondary to acute on chronic systolic and diastolic congestive heart failure- UOP 1.1L in the last 24 hours and creatinine has bumped. Patient is still on 2L O2. -ECHO with EF 25-30% -Continue milrinone drip -Lasix dose decreased to 40mg  IV bid -Receiving daily IV albumin -Cardiology following  -Continue metoprolol -No ACE/ARB due to AKI -Wean O2 as able  A-fib with RVR- HRs elevated over the last several hours -Holding Coumadin in the setting of GI bleed -Off amiodarone -Continue metoprolol   Melena- resolved -S/p vitamin K x1 -Patient was supposed to have EGD on 01/25/2019, but patient became hypoxemic -Will need bleeding scan if patient starts bleeding again -Will need to follow-up with GI as an outpatient  Acute blood loss anemia- hemoglobin low but stable -s/p multiple units PRBC -Transfuse for Hgb < 8  AKI  in CKD III- due to cardiorenal syndrome. Creatinine bumped, likely due to IV diuresis -Nephrology following  Type 2 diabetes -Continue Lantus and SSI  History of CVA -Holding Coumadin in the setting of GI bleed  Case discussed with Care Management/Social Worker. Management plans  discussed with the patient, family and they are in agreement.  CODE STATUS: full  DVT Prophylaxis: SCD  TOTAL TIME TAKING CARE OF THIS PATIENT: 35 minutes.  >50% time spent on counselling and coordination of care  POSSIBLE D/C IN 3-4 DAYS, DEPENDING ON CLINICAL CONDITION.  Note: This dictation was prepared with Dragon dictation along with smaller phrase technology. Any transcriptional errors that result from this process are unintentional.  Evette Doffing M.D on 02/05/2019 at 2:49 PM  Between 7am to 6pm - Pager - [phone]  After 6pm go to [email or web address] - password [email or web address]  Office  [phone]  CC: Primary care physician; [name], MDPatient ID: Meghan [name], female   DOB: 12-16-1950, 68 y.o.   MRN: [id number]

## 2019-02-05 NOTE — Progress Notes (Signed)
[facility], [place] 02/05/19  Subjective:   Patient is doing fair Diuresing  with IV Lasix UOP 1400 cc (no foley) No c/o nausea or vomiting   Remains on milrinone drip No c/o chest pain or SOB Edema is improving O2 by [place]- 2L  10/27 0701 - 10/28 0700 In: 1129.2 [P.O.:900; I.V.:229.2] Out: 1450 [Urine:1450]   Objective:  Vital signs in last 24 hours:  Temp:  [97.6 F (36.4 C)-97.9 F (36.6 C)] 97.9 F (36.6 C) (10/28 0800) Pulse Rate:  [45-124] 122 (10/28 0800) Resp:  [16-22] 17 (10/28 0800) BP: (85-134)/(60-96) 85/60 (10/28 0800) SpO2:  [85 %-100 %] 95 % (10/28 0800) Weight:  [115.3 kg-123.1 kg] 115.3 kg (10/28 0500)  Weight change: -0.439 kg Filed Weights   02/04/19 0600 02/04/19 1000 02/05/19 0500  Weight: 123.5 kg 123.1 kg 115.3 kg    Intake/Output:    Intake/Output Summary (Last 24 hours) at 02/05/2019 0856 Last data filed at 02/05/2019 0851 Gross per 24 hour  Intake 1140.32 ml  Output 1450 ml  Net -309.68 ml     Physical Exam: General:  Sitting up in the bed, no acute distress  HEENT  moist oral mucous membranes  Pulm/lungs  mild bilateral crackles at bases, [place] O2  CVS/Heart  tachycardic, irregular rhythm  Abdomen:   Soft, nontender  Extremities:  1-2+ pitting edema bilaterally  Neurologic:  Alert, oriented  Skin:  Warm, dry    Basic Metabolic Panel:  Recent Labs  Lab 01/30/19 0536  02/01/19 0425 02/02/19 0422 02/03/19 0552 02/04/19 0345 02/05/19 0427  NA 134*   < > 134* 134* 133* 135 133*  K 4.3   < > 4.2 3.8 4.1 4.2 4.3  CL 94*   < > 92* 91* 90* 91* 89*  CO2 31   < > 33* 31 31 36* 34*  GLUCOSE 225*   < > 224* 204* 162* 139* 207*  BUN 57*   < > 61* 56* 54* 54* 54*  CREATININE 2.01*   < > 2.03* 2.05* 1.86* 1.80* 1.99*  CALCIUM 8.5*   < > 8.8* 8.6* 8.7* 8.9 8.9  MG 2.2  --  2.2 2.2  --  2.2 2.1  PHOS  --   --  2.9  --   --  3.5  --    < > = values in this interval not displayed.     CBC: Recent Labs   Lab 01/29/19 1352 02/01/19 0425 02/02/19 0422 02/04/19 0345 02/05/19 0427  WBC  --  7.3 6.8 7.1 6.5  NEUTROABS  --  5.4 5.0  --   --   HGB 8.2* 8.2* 8.1* 8.3* 8.5*  HCT 26.2* 26.8* 26.4* 26.4* 27.4*  MCV  --  85.4 84.6 83.5 83.8  PLT  --  221 205 198 197     No results found for: HEPBSAG, HEPBSAB, HEPBIGM    Microbiology:  No results found for this or any previous visit (from the past 240 hour(s)).  Coagulation Studies: Recent Labs    02/03/19 0552 02/04/19 0345 02/05/19 0427  LABPROT 14.8 14.2 14.3  INR 1.2 1.1 1.1    Urinalysis: No results for input(s): COLORURINE, LABSPEC, PHURINE, GLUCOSEU, HGBUR, BILIRUBINUR, KETONESUR, PROTEINUR, UROBILINOGEN, NITRITE, LEUKOCYTESUR in the last 72 hours.  Invalid input(s): APPERANCEUR    Imaging: No results found.   Medications:   . sodium chloride 0 mL (01/17/19 1010)  . milrinone 0.25 mcg/kg/min (02/05/19 0851)   . sodium chloride   Intravenous Once  .  atorvastatin  40 mg Oral Daily  . Chlorhexidine Gluconate Cloth  6 each Topical Q0600  . furosemide  80 mg Intravenous BID  . insulin aspart  0-5 Units Subcutaneous QHS  . insulin aspart  0-9 Units Subcutaneous TID WC  . insulin aspart  10 Units Subcutaneous TID WC  . insulin glargine  25 Units Subcutaneous QHS  . levothyroxine  50 mcg Oral QAC breakfast  . metoprolol succinate  25 mg Oral QHS  . multivitamin with minerals  1 tablet Oral Daily  . pantoprazole  40 mg Intravenous Q12H  . potassium chloride  20 mEq Oral BID  . predniSONE  20 mg Oral Q breakfast  . sodium chloride flush  3 mL Intravenous Q12H   sodium chloride, acetaminophen, HYDROcodone-acetaminophen, loperamide, ondansetron (ZOFRAN) IV, polyethylene glycol, sodium chloride flush  Assessment/ Plan:  68 y.o. Caucasian female with morbid obesity,chronic systolic congestive heart failure, coronary disease, atrial fibrillation requiring anticoagulation, diabetes, anemia, carotid artery disease with  history of left carotid endarterectomy, previous stroke, hypertension, chronic kidney disease, was admitted on 01/17/2019 with shortness of breath worsening edema, acute kidney injury.   1.  Acute kidney injury 2.  Chronic kidney disease stage IIIb. Baseline Cr 1.70/gfr 30 from 12/15/2018 3.   Pulmonary edema-does not use oxygen at home 4.  Lower extremity edema 5.  Severe anemia 6.  Diabetes type 2, insulin-dependent.  Hemoglobin A1c 7.4% December 14, 2018 7.  Hematuria 8.  Proteinuria 9.  Acute on  chronic systolic congestive heart failure LVEF 25 to 30% 10.  Pulmonary hypertension 11.  Atrial fibrillation requiring anticoagulation 12.  Hyponatremia  Abdominal imaging - diffuse lumbar spine degeneration, fat stranding around pancreas from generalized volume overload, ascites, cardiomegaly and atherosclerosis  2D echo from September 5 shows severely reduced LVEF 20 to 30% mildly dilated left ventricular cavity, diffuse hypokinesis, moderately enlarged right ventricular cavity with elevated pulmonary pressures of 56 mm, severely dilated left atrium, moderately dilated right atrium, moderate tricuspid regurgitation  Acute kidney injury is likely secondary to cardiorenal syndrome.  Patient has relative hypotension which may be leading to renal hypoperfusion.  She has underlying chronic kidney disease secondary to diabetes and hypertension.  Patient is also noted to have severe chronic congestive heart failure and hypoalbuminemia.  Recommendations: - consider changing iv lasix to oral Torsemide - Albumin of 3.3 on October 26.  Has been discontinued since 10/27 - Milrinone infusion as per cardiology team - standing weight on 10/27 is 123.5 kg (271 lbs)  - patient reports target weight = 250 lbs     LOS: 19 Meghan [name] 10/28/20208:56 AM  [facility] [place], [place]  Note: This note was prepared with Dragon dictation. Any transcription errors are  unintentional

## 2019-02-05 NOTE — Progress Notes (Signed)
Progress Note  Patient Name: Meghan [name] Date of Encounter: 02/05/2019  Primary Cardiologist: Kathlyn Sacramento, MD   Subjective   Doing okay today, feels well, no acute events overnight.  Inpatient Medications    Scheduled Meds: . sodium chloride   Intravenous Once  . atorvastatin  40 mg Oral Daily  . Chlorhexidine Gluconate Cloth  6 each Topical Q0600  . furosemide  40 mg Intravenous BID  . insulin aspart  0-5 Units Subcutaneous QHS  . insulin aspart  0-9 Units Subcutaneous TID WC  . insulin aspart  10 Units Subcutaneous TID WC  . insulin glargine  25 Units Subcutaneous QHS  . levothyroxine  50 mcg Oral QAC breakfast  . metoprolol succinate  25 mg Oral QHS  . multivitamin with minerals  1 tablet Oral Daily  . pantoprazole  40 mg Intravenous Q12H  . potassium chloride  20 mEq Oral BID  . predniSONE  20 mg Oral Q breakfast  . sodium chloride flush  3 mL Intravenous Q12H   Continuous Infusions: . sodium chloride 0 mL (01/17/19 1010)  . milrinone 0.25 mcg/kg/min (02/05/19 0936)   PRN Meds: sodium chloride, acetaminophen, HYDROcodone-acetaminophen, loperamide, ondansetron (ZOFRAN) IV, polyethylene glycol, sodium chloride flush   Vital Signs    Vitals:   02/05/19 0700 02/05/19 0800 02/05/19 0900 02/05/19 1000  BP:  (!) 85/60 (!) 86/63 99/70  Pulse: (!) 118 (!) 122 (!) 126 (!) 125  Resp: 18 17 (!) 24 (!) 21  Temp:  97.9 F (36.6 C)    TempSrc:  Oral    SpO2: 95% 95% 95% 95%  Weight:      Height:        Intake/Output Summary (Last 24 hours) at 02/05/2019 1108 Last data filed at 02/05/2019 0923 Gross per 24 hour  Intake 897.32 ml  Output 1450 ml  Net -552.68 ml   Last 3 Weights 02/05/2019 02/04/2019 02/04/2019  Weight (lbs) 254 lb 3.1 oz 271 lb 4.8 oz 272 lb 4.3 oz  Weight (kg) 115.3 kg 123.061 kg 123.5 kg      Telemetry    Atrial flutter, heart rate 102- Personally Reviewed   Physical Exam   GEN: No acute distress, obese.   Neck: No JVD Cardiac:   Regular, not tachycardic, no murmurs, rubs, or gallops.  Respiratory:  Decreased breath sounds at bases, poor inspiratory effort. GI: Soft, nontender, obese MS:  1+ edema; No deformity. Neuro:  Nonfocal  Psych: Normal affect   Labs    High Sensitivity Troponin:   Recent Labs  Lab 01/17/19 0011  TROPONINIHS 13      Chemistry Recent Labs  Lab 01/30/19 0536  02/03/19 0552 02/04/19 0345 02/05/19 0427  NA 134*   < > 133* 135 133*  K 4.3   < > 4.1 4.2 4.3  CL 94*   < > 90* 91* 89*  CO2 31   < > 31 36* 34*  GLUCOSE 225*   < > 162* 139* 207*  BUN 57*   < > 54* 54* 54*  CREATININE 2.01*   < > 1.86* 1.80* 1.99*  CALCIUM 8.5*   < > 8.7* 8.9 8.9  ALBUMIN 3.1*  --  3.3*  --   --   GFRNONAA 25*   < > 27* 28* 25*  GFRAA 28*   < > 31* 32* 29*  ANIONGAP 9   < > 12 8 10    < > = values in this interval not displayed.  Hematology Recent Labs  Lab 02/02/19 0422 02/04/19 0345 02/05/19 0427  WBC 6.8 7.1 6.5  RBC 3.12* 3.16* 3.27*  HGB 8.1* 8.3* 8.5*  HCT 26.4* 26.4* 27.4*  MCV 84.6 83.5 83.8  MCH 26.0 26.3 26.0  MCHC 30.7 31.4 31.0  RDW 19.3* 18.7* 18.6*  PLT 205 198 197    BNP Recent Labs  Lab 02/01/19 0425  BNP 563.0*     DDimer No results for input(s): DDIMER in the last 168 hours.   Radiology    No results found.  Cardiac Studies   2D echo 12/14/2018: 1. The left ventricle has severely reduced systolic function, with an ejection fraction of 25-30%. The cavity size was mildly dilated. Left ventricular diastolic Doppler parameters are indeterminate. Left ventricular diffuse hypokinesis. 2. The right ventricle has mildly reduced systolic function. The cavity was moderately enlarged. There is no increase in right ventricular wall thickness. Right ventricular systolic pressure is moderately elevated with an estimated pressure of 56.2  mmHg. 3. Left atrial size was severely dilated. 4. Right atrial size was moderately dilated. 5. Tricuspid valve regurgitation  is moderate. 6. Rhythm is atrial fibrillation  Patient Profile     68 y.o. female history of CAD, ischemic cardiomyopathy last ejection fraction 25 to 30%, atrial flutter/atrial fibrillation, anemia being evaluated due to fluid overload.  Assessment & Plan  Patient seems to be improving clinically, she is net -300cc over the past 24 hours.  Her creatinine is trending up.her blood pressure is on the low side with systolic in the [id number].   Ischemic cardiomyopathy, last EF 25 to 30%.   Decrease Lasix to 40 mg twice daily.  Due to slightly worsening creatinine function and also low blood pressure. Monitor blood pressure and creatinine. Continue milrinone   A. fib/flutter.   Continue Toprol-XL 25 mg daily for heart rate control.  Titrate as blood pressure permits.  Holding anticoagulation due to history of blood loss.  Greater than 50% was spent in counseling and coordination of care with patient Total encounter time 35 minutes or more      Signed, Kate Sable, MD  02/05/2019, 11:08 AM

## 2019-02-05 NOTE — Consult Note (Signed)
PHARMACY CONSULT NOTE  Pharmacy Consult for Electrolyte Monitoring and Replacement   Recent Labs: Potassium (mmol/L)  Date Value  02/05/2019 4.3  07/10/2013 3.6   Magnesium (mg/dL)  Date Value  02/05/2019 2.1  07/10/2013 1.5 (L)   Calcium (mg/dL)  Date Value  02/05/2019 8.9   Calcium, Total (mg/dL)  Date Value  07/10/2013 8.1 (L)   Albumin (g/dL)  Date Value  02/03/2019 3.3 (L)  06/15/2017 3.5 (L)  07/08/2013 1.7 (L)   Phosphorus (mg/dL)  Date Value  02/04/2019 3.5   Sodium (mmol/L)  Date Value  02/05/2019 133 (L)  12/24/2018 135  07/10/2013 134 (L)   Corrected Ca: 9.74 mg/dL  Assessment: 68 y.o. female with medical problems of chronic systolic congestive heart failure, coronary disease, atrial fibrillation requiring anticoagulation, diabetes, anemia, carotid artery disease with history of left carotid endarterectomy, previous stroke, hypertension, chronic kidney disease, was admitted on10/9/2020with shortness of breath worsening edema, acute kidney injury.   This patient is currently receiving the following: KCl 20 mEq po BID Furosemide 40mg  IV BID (reduced 10/28)  Goal of Therapy:  Given cardiac history: Potassium 4.0 - 5.1 mmol/L Magnesium: 2.0 - 2.4 mg/dL Other electrolytes WNL  Plan:   Continue potassium 20 mEq PO BID  No additional electrolyte supplementation warranted at this time  Recheck electrolytes with am labs.  Dallie Piles, PharmD 02/05/2019 7:41 AM

## 2019-02-05 NOTE — Progress Notes (Signed)
Inpatient Diabetes Program Recommendations  AACE/ADA: New Consensus Statement on Inpatient Glycemic Control (2015)  Target Ranges:  Prepandial:   less than 140 mg/dL      Peak postprandial:   less than 180 mg/dL (1-2 hours)      Critically ill patients:  140 - 180 mg/dL   Results for Meghan [name], Meghan [name] (MRN [id number]) as of 02/05/2019 13:47  Ref. Range 02/04/2019 07:12 02/04/2019 11:35 02/04/2019 16:16 02/04/2019 22:03  Glucose-Capillary Latest Ref Range: 70 - 99 mg/dL 107 (H)  Novolog Meal Coverage HELD 271 (H)  15 units NOVOLOG  400 (H)  19 units NOVOLOG  303 (H)  3 units NOVOLOG +  25 units LANTUS   Results for Meghan [name], Meghan [name] (MRN [id number]) as of 02/05/2019 13:47  Ref. Range 02/05/2019 07:40 02/05/2019 11:50  Glucose-Capillary Latest Ref Range: 70 - 99 mg/dL 160 (H)  12 units NOVOLOG  221 (H)  13 units NOVOLOG     Home DM Meds: Tresiba 25 units Daily                             Glipizide 10 mg BID  Current Orders: Lantus 25 units QHS                             Novolog Sensitive Correction Scale/ SSI (0-9 units) TID AC + HS                            Novolog 10 units TID with meals    Prednisone 20 mg Daily.    MD- Please consider the following in-hospital insulin adjustments:  Increase Novolog Meal Coverage to: Novolog 12 units TID with meals  (Please add the following Hold Parameters: Hold if pt eats <50% of meal, Hold if pt NPO)    --Will follow patient during hospitalization--  Wyn Quaker RN, MSN, CDE Diabetes Coordinator Inpatient Glycemic Control Team Team Pager: [phone] (8a-5p)

## 2019-02-06 DIAGNOSIS — I4821 Permanent atrial fibrillation: Secondary | ICD-10-CM

## 2019-02-06 DIAGNOSIS — D649 Anemia, unspecified: Secondary | ICD-10-CM | POA: Diagnosis not present

## 2019-02-06 DIAGNOSIS — I5023 Acute on chronic systolic (congestive) heart failure: Secondary | ICD-10-CM | POA: Diagnosis not present

## 2019-02-06 DIAGNOSIS — N179 Acute kidney failure, unspecified: Secondary | ICD-10-CM | POA: Diagnosis not present

## 2019-02-06 DIAGNOSIS — N049 Nephrotic syndrome with unspecified morphologic changes: Secondary | ICD-10-CM | POA: Diagnosis not present

## 2019-02-06 LAB — CBC
HCT: 27.2 % — ABNORMAL LOW (ref 36.0–46.0)
Hemoglobin: 8.3 g/dL — ABNORMAL LOW (ref 12.0–15.0)
MCH: 25.6 pg — ABNORMAL LOW (ref 26.0–34.0)
MCHC: 30.5 g/dL (ref 30.0–36.0)
MCV: 84 fL (ref 80.0–100.0)
Platelets: 204 10*3/uL (ref 150–400)
RBC: 3.24 MIL/uL — ABNORMAL LOW (ref 3.87–5.11)
RDW: 18.7 % — ABNORMAL HIGH (ref 11.5–15.5)
WBC: 6.3 10*3/uL (ref 4.0–10.5)
nRBC: 0 % (ref 0.0–0.2)

## 2019-02-06 LAB — PROTIME-INR
INR: 1.1 (ref 0.8–1.2)
Prothrombin Time: 14.3 seconds (ref 11.4–15.2)

## 2019-02-06 LAB — BASIC METABOLIC PANEL
Anion gap: 9 (ref 5–15)
BUN: 56 mg/dL — ABNORMAL HIGH (ref 8–23)
CO2: 33 mmol/L — ABNORMAL HIGH (ref 22–32)
Calcium: 8.8 mg/dL — ABNORMAL LOW (ref 8.9–10.3)
Chloride: 90 mmol/L — ABNORMAL LOW (ref 98–111)
Creatinine, Ser: 2.02 mg/dL — ABNORMAL HIGH (ref 0.44–1.00)
GFR calc Af Amer: 28 mL/min — ABNORMAL LOW (ref >60)
GFR calc non Af Amer: 24 mL/min — ABNORMAL LOW (ref >60)
Glucose, Bld: 288 mg/dL — ABNORMAL HIGH (ref 70–99)
Potassium: 4.5 mmol/L (ref 3.5–5.1)
Sodium: 132 mmol/L — ABNORMAL LOW (ref 135–145)

## 2019-02-06 LAB — GLUCOSE, CAPILLARY
Glucose-Capillary: 229 mg/dL — ABNORMAL HIGH (ref 70–99)
Glucose-Capillary: 252 mg/dL — ABNORMAL HIGH (ref 70–99)
Glucose-Capillary: 289 mg/dL — ABNORMAL HIGH (ref 70–99)
Glucose-Capillary: 254 mg/dL — ABNORMAL HIGH (ref 70–99)

## 2019-02-06 LAB — MAGNESIUM: Magnesium: 2.1 mg/dL (ref 1.7–2.4)

## 2019-02-06 MED ORDER — DIGOXIN 0.25 MG/ML IJ SOLN
0.2500 mg | Freq: Once | INTRAMUSCULAR | Status: AC
  Administered 2019-02-06: 13:00:00 0.25 mg via INTRAVENOUS
  Filled 2019-02-06: qty 2

## 2019-02-06 MED ORDER — INSULIN ASPART 100 UNIT/ML ~~LOC~~ SOLN
12.0000 [IU] | Freq: Three times a day (TID) | SUBCUTANEOUS | Status: DC
  Administered 2019-02-07 – 2019-02-08 (×5): 12 [IU] via SUBCUTANEOUS
  Filled 2019-02-06 (×5): qty 1

## 2019-02-06 MED ORDER — METOPROLOL TARTRATE 25 MG PO TABS
25.0000 mg | ORAL_TABLET | Freq: Two times a day (BID) | ORAL | Status: DC
  Administered 2019-02-06 – 2019-02-08 (×5): 25 mg via ORAL
  Filled 2019-02-06 (×5): qty 1

## 2019-02-06 MED ORDER — METOPROLOL SUCCINATE ER 50 MG PO TB24: 25.0000 mg | ORAL_TABLET | Freq: Two times a day (BID) | ORAL | Status: DC

## 2019-02-06 NOTE — Progress Notes (Signed)
Progress Note  Patient Name: Meghan [name] Date of Encounter: 02/06/2019  Primary Cardiologist: Kathlyn [place], MD   Subjective   Feels well, no complaints -21 L this admission still on milrinone 0.125 Tachycardic, appears to be flutter rate 120 bpm regular Blood pressure periodically low [id number] systolic currently [id number] systolic Denies orthostasis symptoms Creatinine trending upwards, BUN trending upward Still on Lasix 40 IV twice daily  Inpatient Medications    Scheduled Meds:  sodium chloride   Intravenous Once   atorvastatin  40 mg Oral Daily   Chlorhexidine Gluconate Cloth  6 each Topical Q0600   furosemide  40 mg Intravenous BID   insulin aspart  0-5 Units Subcutaneous QHS   insulin aspart  0-9 Units Subcutaneous TID WC   insulin aspart  10 Units Subcutaneous TID WC   insulin glargine  25 Units Subcutaneous QHS   levothyroxine  50 mcg Oral QAC breakfast   metoprolol succinate  25 mg Oral QHS   multivitamin with minerals  1 tablet Oral Daily   pantoprazole  40 mg Intravenous Q12H   potassium chloride  20 mEq Oral BID   predniSONE  20 mg Oral Q breakfast   sodium chloride flush  3 mL Intravenous Q12H   Continuous Infusions:  sodium chloride 0 mL (01/17/19 1010)   milrinone 0.25 mcg/kg/min (02/06/19 0600)   PRN Meds: sodium chloride, acetaminophen, HYDROcodone-acetaminophen, loperamide, ondansetron (ZOFRAN) IV, polyethylene glycol, sodium chloride flush   Vital Signs    Vitals:   02/06/19 0900 02/06/19 1000 02/06/19 1100 02/06/19 1200  BP: 112/61 (!) 111/91 116/82   Pulse: (!) 125 (!) 122 (!) 122   Resp: 19     Temp:      TempSrc:      SpO2: 97% 95% 92% 94%  Weight:  121.4 kg    Height:  5\' 7"  (1.702 m)      Intake/Output Summary (Last 24 hours) at 02/06/2019 1221 Last data filed at 02/06/2019 1009 Gross per 24 hour  Intake 452.67 ml  Output 2925 ml  Net -2472.33 ml   Last 3 Weights 02/06/2019 02/06/2019 02/05/2019  Weight (lbs) 267 lb  10.2 oz 252 lb 13.9 oz 254 lb 3.1 oz  Weight (kg) 121.4 kg 114.7 kg 115.3 kg      Telemetry    Atrial flutter, heart rate 120- Personally Reviewed   Physical Exam   GEN: No acute distress, obese.   Neck: No JVD Cardiac:  Regular, tachycardic no murmurs, rubs, or gallops.  Respiratory:  Decreased breath sounds at bases, diffuse fine rales throughout GI: Soft, nontender, obese MS:  No deformity, good range of motion Neuro:  Nonfocal  Psych: Normal affect   Labs    High Sensitivity Troponin:   Recent Labs  Lab 01/17/19 0011  TROPONINIHS 13      Chemistry Recent Labs  Lab 02/03/19 0552 02/04/19 0345 02/05/19 0427 02/06/19 0337  NA 133* 135 133* 132*  K 4.1 4.2 4.3 4.5  CL 90* 91* 89* 90*  CO2 31 36* 34* 33*  GLUCOSE 162* 139* 207* 288*  BUN 54* 54* 54* 56*  CREATININE 1.86* 1.80* 1.99* 2.02*  CALCIUM 8.7* 8.9 8.9 8.8*  ALBUMIN 3.3*  --   --   --   GFRNONAA 27* 28* 25* 24*  GFRAA 31* 32* 29* 28*  ANIONGAP 12 8 10 9      Hematology Recent Labs  Lab 02/04/19 0345 02/05/19 0427 02/06/19 0337  WBC 7.1 6.5 6.3  RBC 3.16* 3.27* 3.24*  HGB 8.3* 8.5* 8.3*  HCT 26.4* 27.4* 27.2*  MCV 83.5 83.8 84.0  MCH 26.3 26.0 25.6*  MCHC 31.4 31.0 30.5  RDW 18.7* 18.6* 18.7*  PLT 198 197 204    BNP Recent Labs  Lab 02/01/19 0425  BNP 563.0*     DDimer No results for input(s): DDIMER in the last 168 hours.   Radiology    No results found.  Cardiac Studies   2D echo 12/14/2018: 1. The left ventricle has severely reduced systolic function, with an ejection fraction of 25-30%. The cavity size was mildly dilated. Left ventricular diastolic Doppler parameters are indeterminate. Left ventricular diffuse hypokinesis. 2. The right ventricle has mildly reduced systolic function. The cavity was moderately enlarged. There is no increase in right ventricular wall thickness. Right ventricular systolic pressure is moderately elevated with an estimated pressure of 56.2   mmHg. 3. Left atrial size was severely dilated. 4. Right atrial size was moderately dilated. 5. Tricuspid valve regurgitation is moderate. 6. Rhythm is atrial fibrillation  Patient Profile     68 y.o. female history of CAD, ischemic cardiomyopathy last ejection fraction 25 to 30%, atrial flutter/atrial fibrillation, anemia being evaluated due to fluid overload.  Assessment & Plan   Ischemic cardiomyopathy, last EF 25 to 30%.   Low blood pressure, getting tachycardic, rate locked in at 120, rising BUN and creatinine -Concerning for hypovolemia 21 L negative We will get a reds vest score on the floor Recommend we wean off the milrinone today  A. fib/flutter.   Poorly controlled heart rate locked in at 120 bpm Possibly exacerbated by hypovolemia and milrinone Will wean off the milrinone today Arrhythmia will need to be addressed before discharge -Difficult situation because hematocrit still low 27, concern for GI bleed on arrival -Ideally would like to start test with heparin infusion, would be better with hemoglobin of 10 -Given renal dysfunction not a good candidate for digoxin Potentially could try low-dose Blood pressure low limiting titration of rate control medications Will continue metoprolol succinate, consider increasing up to 25 twice daily -Best option may be test of heparin infusion over the next several days with TEE cardioversion next Monday   Long discussion with family concerning issues with arrhythmia I will be difficult to restore normal sinus rhythm Greater than 50% was spent in counseling and coordination of care with patient Total encounter time 35 minutes or more      Signed, Ida Rogue, MD  02/06/2019, 12:21 PM

## 2019-02-06 NOTE — Progress Notes (Addendum)
[place] [facility] [place] NAME: Meghan [name]    MR#:  [id number]  DATE OF BIRTH:  1951/02/25  SUBJECTIVE:   Patient continues to do well. No SOB or chest pain.  Lower extremity edema continues to get better every day.  REVIEW OF SYSTEMS:   Review of Systems  Constitutional: Negative for chills, fever and weight loss.  HENT: Negative for ear discharge, ear pain and nosebleeds.   Eyes: Negative for blurred vision, pain and discharge.  Respiratory: Negative for sputum production, shortness of breath, wheezing and stridor.   Cardiovascular: Positive for leg swelling. Negative for chest pain, palpitations, orthopnea and PND.  Gastrointestinal: Negative for abdominal pain, diarrhea, nausea and vomiting.  Genitourinary: Negative for frequency and urgency.  Musculoskeletal: Negative for back pain and joint pain.  Neurological: Negative for sensory change, speech change, focal weakness and weakness.  Psychiatric/Behavioral: Negative for depression and hallucinations. The patient is not nervous/anxious.    DRUG ALLERGIES:   Allergies  Allergen Reactions  . Spironolactone     Hyperkalemia     VITALS:  Blood pressure 113/73, pulse (!) 117, temperature 98.1 F (36.7 C), resp. rate 19, height 5\' 7"  (1.702 m), weight 121.4 kg, SpO2 94 %.  PHYSICAL EXAMINATION:   Physical Exam  GENERAL:  68 y.o.-year-old patient lying in the bed with no acute distress. obese EYES: Pupils equal, round, reactive to light and accommodation. No scleral icterus. Extraocular muscles intact.  HEENT: Head atraumatic, normocephalic. Oropharynx and nasopharynx clear.  NECK:  Supple, +jugular venous distention. No thyroid enlargement, no tenderness.  LUNGS: + Diminished breath sounds in the lung bases bilaterally.  Nasal cannula in place. No use of accessory muscles of respiration.  CARDIOVASCULAR: Irregularly irregular rhythm, tachycardic, S1, S2 normal. No murmurs,  rubs, or gallops.  ABDOMEN: Soft, nontender, nondistended. Bowel sounds present. No organomegaly or mass.  EXTREMITIES: No cyanosis, clubbing. 1+ pitting edema in the lower extremities bilaterally, much improved. NEUROLOGIC: Cranial nerves II through XII are intact. No focal Motor or sensory deficits b/l.   PSYCHIATRIC:  patient is alert and oriented x 3.  SKIN: No obvious rash, lesion, or ulcer.   LABORATORY PANEL:  CBC Recent Labs  Lab 02/06/19 0337  WBC 6.3  HGB 8.3*  HCT 27.2*  PLT 204    Chemistries  Recent Labs  Lab 02/06/19 0337  NA 132*  K 4.5  CL 90*  CO2 33*  GLUCOSE 288*  BUN 56*  CREATININE 2.02*  CALCIUM 8.8*  MG 2.1   Cardiac Enzymes No results for input(s): TROPONINI in the last 168 hours. RADIOLOGY:  No results found. ASSESSMENT AND PLAN:   Acute hypoxic respiratory failure secondary to acute on chronic systolic and diastolic congestive heart failure- patient is likely approaching her dry weight if not already there. -ECHO with EF 25-30% -Weaning off milrinone gtt today per cards -Lasix dose decreased to 40mg  IV bid- can likely switch to po, but will defer to cards -Cardiology following  -Continue metoprolol -No ACE/ARB due to AKI -Wean O2 as able  A-fib with RVR- HRs still elevated today -Holding Coumadin in the setting of GI bleed -Off amiodarone -Continue metoprolol   Melena- resolved -S/p vitamin K x1 -Patient was supposed to have EGD on 01/25/2019, but patient became hypoxemic -Will need bleeding scan if patient starts bleeding again -Will need to follow-up with GI as an outpatient  Acute blood loss anemia- hemoglobin low but stable -s/p multiple units PRBC -Transfuse for  Hgb < 8  AKI in CKD III- due to cardiorenal syndrome. Creatinine continues to bump. -Nephrology following  Type 2 diabetes -Continue Lantus and SSI  History of CVA -Holding Coumadin in the setting of GI bleed  Case discussed with Care Management/Social  Worker. Management plans discussed with the patient, family and they are in agreement.  CODE STATUS: full  DVT Prophylaxis: SCD  TOTAL TIME TAKING CARE OF THIS PATIENT: 35 minutes.  >50% time spent on counselling and coordination of care  POSSIBLE D/C IN 3-4 DAYS, DEPENDING ON CLINICAL CONDITION.  Note: This dictation was prepared with Dragon dictation along with smaller phrase technology. Any transcriptional errors that result from this process are unintentional.  Evette Doffing M.D on 02/06/2019 at 4:01 PM  Between 7am to 6pm - Pager - [phone]  After 6pm go to [email or web address] - password [email or web address] [facility]  Office  [phone]  CC: Primary care physician; [name], MDPatient ID: Meghan [name], female   DOB: 29-Dec-1950, 68 y.o.   MRN: [id number]

## 2019-02-06 NOTE — Progress Notes (Signed)
Inpatient Diabetes Program Recommendations  AACE/ADA: New Consensus Statement on Inpatient Glycemic Control (2015)  Target Ranges:  Prepandial:   less than 140 mg/dL      Peak postprandial:   less than 180 mg/dL (1-2 hours)      Critically ill patients:  140 - 180 mg/dL    Results for TROI, RIRIE (MRN [id number]) as of 02/06/2019 09:27  Ref. Range 02/05/2019 07:40 02/05/2019 11:50 02/05/2019 16:08 02/05/2019 22:44 02/06/2019 07:30  Glucose-Capillary Latest Ref Range: 70 - 99 mg/dL 160 (H)  Novolog 12 units  PO prednisone 20 mg  221 (H)  Novolog 13 untis 358 (H)  Novolog 19 units 370 (H)  Novolog 5 units      Lantus 25 units 229 (H)  Novolog 13 units  PO prednisone 20 mg    Home DM Meds: Tresiba 25 units Daily                             Glipizide 10 mg BID  Current Orders: Lantus 25 units QHS                             Novolog Sensitive Correction Scale/ SSI (0-9 units) TID AC + HS                            Novolog 10 units TID with meals    Prednisone 20 mg Daily.   MD- Please consider the following in-hospital insulin adjustments:  Increase Novolog Meal Coverage to: Novolog 12 units TID with meals  (Please add the following Hold Parameters: Hold if pt eats <50% of meal, Hold if pt NPO)    --Will follow patient during hospitalization--  Tama Headings RN, MSN, BC-ADM Inpatient Diabetes Coordinator Team Pager [phone] (8a-5p)

## 2019-02-06 NOTE — Consult Note (Signed)
PHARMACY CONSULT NOTE  Pharmacy Consult for Electrolyte Monitoring and Replacement   Recent Labs: Potassium (mmol/L)  Date Value  02/06/2019 4.5  07/10/2013 3.6   Magnesium (mg/dL)  Date Value  02/06/2019 2.1  07/10/2013 1.5 (L)   Calcium (mg/dL)  Date Value  02/06/2019 8.8 (L)   Calcium, Total (mg/dL)  Date Value  07/10/2013 8.1 (L)   Albumin (g/dL)  Date Value  02/03/2019 3.3 (L)  06/15/2017 3.5 (L)  07/08/2013 1.7 (L)   Phosphorus (mg/dL)  Date Value  02/04/2019 3.5   Sodium (mmol/L)  Date Value  02/06/2019 132 (L)  12/24/2018 135  07/10/2013 134 (L)   Corrected Ca: 9.4 mg/dL  Assessment: 68 y.o. female with medical problems of chronic systolic congestive heart failure, coronary disease, atrial fibrillation requiring anticoagulation, diabetes, anemia, carotid artery disease with history of left carotid endarterectomy, previous stroke, hypertension, chronic kidney disease, was admitted on10/9/2020with shortness of breath worsening edema, acute kidney injury.   This patient is currently receiving the following: KCl 20 mEq PO BID Furosemide 40mg  IV BID (reduced 10/28)  Goals of Therapy:  Given cardiac history: Potassium 4.0 - 5.1 mmol/L Magnesium: 2.0 - 2.4 mg/dL Other electrolytes WNL  Plan:   Discontinue potassium 20 mEq PO BID (K: 4.5 this AM)  No additional electrolyte supplementation warranted at this time  Recheck electrolytes with AM labs.  Raiford Simmonds, PharmD Candidate 02/06/2019 12:36 PM

## 2019-02-06 NOTE — Progress Notes (Signed)
Good day. Weaned off of Milirone drip. May transfer to 2A.

## 2019-02-06 NOTE — Progress Notes (Signed)
[facility], [place] 02/06/19  Subjective:   Patient is doing fair Diuresing well with IV Lasix, dose reduced yesterday Able to eat without nausea or vomiting  Remains on milrinone drip No chest pain or SOB O2 by [place] Leg Edema has reduced  10/28 0701 - 10/29 0700 In: 603.9 [P.O.:240; I.V.:363.9] Out: 775 [Urine:775]   Objective:  Vital signs in last 24 hours:  Temp:  [97.9 F (36.6 C)-98.4 F (36.9 C)] 98.1 F (36.7 C) (10/29 0800) Pulse Rate:  [116-125] 125 (10/29 0900) Resp:  [11-20] 19 (10/29 0900) BP: (96-127)/(61-85) 112/61 (10/29 0900) SpO2:  [90 %-100 %] 97 % (10/29 0900) Weight:  [114.7 kg-121.4 kg] 121.4 kg (10/29 1000)  Weight change: -8.361 kg Filed Weights   02/05/19 0500 02/06/19 0500 02/06/19 1000  Weight: 115.3 kg 114.7 kg 121.4 kg    Intake/Output:    Intake/Output Summary (Last 24 hours) at 02/06/2019 1104 Last data filed at 02/06/2019 1009 Gross per 24 hour  Intake 452.67 ml  Output 2925 ml  Net -2472.33 ml     Physical Exam: General:  Sitting up in the bed, no acute distress  HEENT  moist oral mucous membranes  Pulm/lungs  clear to auscultation today, [place] O2  CVS/Heart  atrial flutter, irregular rhythm  Abdomen:   Soft, nontender  Extremities:  trace pitting edema    Neurologic:  Alert, oriented  Skin:  Warm, dry    Basic Metabolic Panel:  Recent Labs  Lab 02/01/19 0425 02/02/19 0422 02/03/19 0552 02/04/19 0345 02/05/19 0427 02/06/19 0337  NA 134* 134* 133* 135 133* 132*  K 4.2 3.8 4.1 4.2 4.3 4.5  CL 92* 91* 90* 91* 89* 90*  CO2 33* 31 31 36* 34* 33*  GLUCOSE 224* 204* 162* 139* 207* 288*  BUN 61* 56* 54* 54* 54* 56*  CREATININE 2.03* 2.05* 1.86* 1.80* 1.99* 2.02*  CALCIUM 8.8* 8.6* 8.7* 8.9 8.9 8.8*  MG 2.2 2.2  --  2.2 2.1 2.1  PHOS 2.9  --   --  3.5  --   --      CBC: Recent Labs  Lab 02/01/19 0425 02/02/19 0422 02/04/19 0345 02/05/19 0427 02/06/19 0337  WBC 7.3 6.8 7.1 6.5 6.3   NEUTROABS 5.4 5.0  --   --   --   HGB 8.2* 8.1* 8.3* 8.5* 8.3*  HCT 26.8* 26.4* 26.4* 27.4* 27.2*  MCV 85.4 84.6 83.5 83.8 84.0  PLT 221 205 198 197 204     No results found for: HEPBSAG, HEPBSAB, HEPBIGM    Microbiology:  No results found for this or any previous visit (from the past 240 hour(s)).  Coagulation Studies: Recent Labs    02/04/19 0345 02/05/19 0427 02/06/19 0337  LABPROT 14.2 14.3 14.3  INR 1.1 1.1 1.1    Urinalysis: No results for input(s): COLORURINE, LABSPEC, PHURINE, GLUCOSEU, HGBUR, BILIRUBINUR, KETONESUR, PROTEINUR, UROBILINOGEN, NITRITE, LEUKOCYTESUR in the last 72 hours.  Invalid input(s): APPERANCEUR    Imaging: No results found.   Medications:   . sodium chloride 0 mL (01/17/19 1010)  . milrinone 0.25 mcg/kg/min (02/06/19 0600)   . sodium chloride   Intravenous Once  . atorvastatin  40 mg Oral Daily  . Chlorhexidine Gluconate Cloth  6 each Topical Q0600  . furosemide  40 mg Intravenous BID  . insulin aspart  0-5 Units Subcutaneous QHS  . insulin aspart  0-9 Units Subcutaneous TID WC  . insulin aspart  10 Units Subcutaneous TID WC  .  insulin glargine  25 Units Subcutaneous QHS  . levothyroxine  50 mcg Oral QAC breakfast  . metoprolol succinate  25 mg Oral QHS  . multivitamin with minerals  1 tablet Oral Daily  . pantoprazole  40 mg Intravenous Q12H  . potassium chloride  20 mEq Oral BID  . predniSONE  20 mg Oral Q breakfast  . sodium chloride flush  3 mL Intravenous Q12H   sodium chloride, acetaminophen, HYDROcodone-acetaminophen, loperamide, ondansetron (ZOFRAN) IV, polyethylene glycol, sodium chloride flush  Assessment/ Plan:  68 y.o. Caucasian female with morbid obesity,chronic systolic congestive heart failure, coronary disease, atrial fibrillation requiring anticoagulation, diabetes, anemia, carotid artery disease with history of left carotid endarterectomy, previous stroke, hypertension, chronic kidney disease, was admitted on  01/17/2019 with shortness of breath worsening edema, acute kidney injury.   1.  Acute kidney injury 2.  Chronic kidney disease stage IIIb. Baseline Cr 1.70/gfr 30 from 12/15/2018 3.   Pulmonary edema-does not use oxygen at home 4.  Lower extremity edema 5.  Severe anemia 6.  Diabetes, insulin-dependent.  Hemoglobin A1c 7.4% December 14, 2018 7.  Hematuria 8.  Proteinuria 9.  Acute on  chronic systolic congestive heart failure LVEF 25 to 30% 10.  Pulmonary hypertension 11.  Atrial fibrillation requiring anticoagulation 12.  Hyponatremia  Abdominal imaging - diffuse lumbar spine degeneration, fat stranding around pancreas from generalized volume overload, ascites, cardiomegaly and atherosclerosis  2D echo from September 5 shows severely reduced LVEF 20 to 30% mildly dilated left ventricular cavity, diffuse hypokinesis, moderately enlarged right ventricular cavity with elevated pulmonary pressures of 56 mm, severely dilated left atrium, moderately dilated right atrium, moderate tricuspid regurgitation  Acute kidney injury is likely secondary to cardiorenal syndrome.  Patient has relative hypotension which may be leading to renal hypoperfusion.  She has underlying chronic kidney disease secondary to diabetes and hypertension.  Patient is also noted to have severe chronic congestive heart failure and hypoalbuminemia.  Recommend: -Good response to IV furosemide,  Patient appears to have achieved volume optimization Consider changing diuretic to Torsemide 40 mg daily -Albumin of 3.3 on October 26. dsicontinued -Milrinone infusion as per cardiology team - daily standing weight . Today = 121.4 kg - patient reports Dry weight = 250 lbs (113.6 kg)     LOS: [place] 10/29/202011:04 AM  [place], [place]  Note: This note was prepared with Dragon dictation. Any transcription errors are unintentional

## 2019-02-07 DIAGNOSIS — N179 Acute kidney failure, unspecified: Secondary | ICD-10-CM | POA: Diagnosis not present

## 2019-02-07 DIAGNOSIS — N049 Nephrotic syndrome with unspecified morphologic changes: Secondary | ICD-10-CM | POA: Diagnosis not present

## 2019-02-07 DIAGNOSIS — I509 Heart failure, unspecified: Secondary | ICD-10-CM | POA: Diagnosis not present

## 2019-02-07 DIAGNOSIS — D649 Anemia, unspecified: Secondary | ICD-10-CM | POA: Diagnosis not present

## 2019-02-07 LAB — BASIC METABOLIC PANEL
Anion gap: 14 (ref 5–15)
BUN: 59 mg/dL — ABNORMAL HIGH (ref 8–23)
CO2: 30 mmol/L (ref 22–32)
Calcium: 9.2 mg/dL (ref 8.9–10.3)
Chloride: 89 mmol/L — ABNORMAL LOW (ref 98–111)
Creatinine, Ser: 2.07 mg/dL — ABNORMAL HIGH (ref 0.44–1.00)
GFR calc Af Amer: 27 mL/min — ABNORMAL LOW (ref >60)
GFR calc non Af Amer: 24 mL/min — ABNORMAL LOW (ref >60)
Glucose, Bld: 147 mg/dL — ABNORMAL HIGH (ref 70–99)
Potassium: 4.3 mmol/L (ref 3.5–5.1)
Sodium: 133 mmol/L — ABNORMAL LOW (ref 135–145)

## 2019-02-07 LAB — CBC
HCT: 30.1 % — ABNORMAL LOW (ref 36.0–46.0)
Hemoglobin: 9.2 g/dL — ABNORMAL LOW (ref 12.0–15.0)
MCH: 25.8 pg — ABNORMAL LOW (ref 26.0–34.0)
MCHC: 30.6 g/dL (ref 30.0–36.0)
MCV: 84.6 fL (ref 80.0–100.0)
Platelets: 212 10*3/uL (ref 150–400)
RBC: 3.56 MIL/uL — ABNORMAL LOW (ref 3.87–5.11)
RDW: 18.5 % — ABNORMAL HIGH (ref 11.5–15.5)
WBC: 7.1 10*3/uL (ref 4.0–10.5)
nRBC: 0 % (ref 0.0–0.2)

## 2019-02-07 LAB — PROTIME-INR
INR: 1.1 (ref 0.8–1.2)
Prothrombin Time: 13.9 seconds (ref 11.4–15.2)

## 2019-02-07 LAB — GLUCOSE, CAPILLARY
Glucose-Capillary: 120 mg/dL — ABNORMAL HIGH (ref 70–99)
Glucose-Capillary: 293 mg/dL — ABNORMAL HIGH (ref 70–99)
Glucose-Capillary: 428 mg/dL — ABNORMAL HIGH (ref 70–99)
Glucose-Capillary: 163 mg/dL — ABNORMAL HIGH (ref 70–99)

## 2019-02-07 LAB — MAGNESIUM: Magnesium: 2.3 mg/dL (ref 1.7–2.4)

## 2019-02-07 MED ORDER — SODIUM CHLORIDE 0.9 % IV SOLN
200.0000 mg | Freq: Once | INTRAVENOUS | Status: AC
  Administered 2019-02-07: 18:00:00 200 mg via INTRAVENOUS
  Filled 2019-02-07: qty 10

## 2019-02-07 MED ORDER — APIXABAN 5 MG PO TABS
5.0000 mg | ORAL_TABLET | Freq: Two times a day (BID) | ORAL | Status: DC
  Administered 2019-02-07 – 2019-02-08 (×2): 5 mg via ORAL
  Filled 2019-02-07 (×2): qty 1

## 2019-02-07 MED ORDER — DIGOXIN 125 MCG PO TABS
0.0625 mg | ORAL_TABLET | Freq: Every day | ORAL | Status: DC
  Administered 2019-02-07 – 2019-02-08 (×2): 0.0625 mg via ORAL
  Filled 2019-02-07 (×2): qty 0.5

## 2019-02-07 MED ORDER — LOSARTAN POTASSIUM 25 MG PO TABS
12.5000 mg | ORAL_TABLET | Freq: Every day | ORAL | Status: DC
  Administered 2019-02-07 – 2019-02-08 (×2): 12.5 mg via ORAL
  Filled 2019-02-07 (×2): qty 1

## 2019-02-07 NOTE — Progress Notes (Addendum)
Shift summary:  - NAD upon assessment. Eating breakfast.  - Plan: transfer to tele floor today.  - Report given to  2A RN, [name].

## 2019-02-07 NOTE — Plan of Care (Signed)
  Problem: Education: Goal: Knowledge of General Education information will improve Description: Including pain rating scale, medication(s)/side effects and non-pharmacologic comfort measures Outcome: Progressing   Problem: Health Behavior/Discharge Planning: Goal: Ability to manage health-related needs will improve Outcome: Progressing   Problem: Clinical Measurements: Goal: Ability to maintain clinical measurements within normal limits will improve Outcome: Progressing Goal: Will remain free from infection Outcome: Progressing Goal: Diagnostic test results will improve Outcome: Progressing Goal: Respiratory complications will improve Outcome: Progressing Goal: Cardiovascular complication will be avoided Outcome: Progressing   Problem: Activity: Goal: Risk for activity intolerance will decrease Outcome: Progressing   Problem: Elimination: Goal: Will not experience complications related to bowel motility Outcome: Progressing   Problem: Pain Managment: Goal: General experience of comfort will improve Outcome: Progressing   Problem: Safety: Goal: Ability to remain free from injury will improve Outcome: Progressing   Problem: Skin Integrity: Goal: Risk for impaired skin integrity will decrease Outcome: Progressing   Problem: Education: Goal: Ability to demonstrate management of disease process will improve Outcome: Progressing Goal: Ability to verbalize understanding of medication therapies will improve Outcome: Progressing Goal: Individualized Educational Video(s) Outcome: Progressing   Problem: Activity: Goal: Capacity to carry out activities will improve Outcome: Progressing   Problem: Cardiac: Goal: Ability to achieve and maintain adequate cardiopulmonary perfusion will improve Outcome: Progressing

## 2019-02-07 NOTE — Consult Note (Signed)
PHARMACY CONSULT NOTE  Pharmacy Consult for Electrolyte Monitoring and Replacement   Recent Labs: Potassium (mmol/L)  Date Value  02/07/2019 4.3  07/10/2013 3.6   Magnesium (mg/dL)  Date Value  02/07/2019 2.3  07/10/2013 1.5 (L)   Calcium (mg/dL)  Date Value  02/07/2019 9.2   Calcium, Total (mg/dL)  Date Value  07/10/2013 8.1 (L)   Albumin (g/dL)  Date Value  02/03/2019 3.3 (L)  06/15/2017 3.5 (L)  07/08/2013 1.7 (L)   Phosphorus (mg/dL)  Date Value  02/04/2019 3.5   Sodium (mmol/L)  Date Value  02/07/2019 133 (L)  12/24/2018 135  07/10/2013 134 (L)   Corrected Ca: 9.4 mg/dL  Assessment: 68 y.o. female with medical problems of chronic systolic congestive heart failure, coronary disease, atrial fibrillation requiring anticoagulation, diabetes, anemia, carotid artery disease with history of left carotid endarterectomy, previous stroke, hypertension, chronic kidney disease, was admitted on10/9/2020with shortness of breath worsening edema, acute kidney injury.   This patient is currently receiving the following: Furosemide 40mg  IV BID  Goals of Therapy:  Given cardiac history: Potassium 4.0 - 5.1 mmol/L Magnesium: 2.0 - 2.4 mg/dL Other electrolytes WNL  Plan:   No additional electrolyte supplementation warranted at this time  Recheck electrolytes with AM labs.  Oswald Hillock, PharmD 02/07/2019 10:14 AM

## 2019-02-07 NOTE — Progress Notes (Signed)
MD notified. Pts CBG is >400. MD orders RN to follow SSI. I will continue to assess.

## 2019-02-07 NOTE — Progress Notes (Addendum)
[facility] [place] NAME: Meghan [name]    MR#:  [id number]  DATE OF BIRTH:  1951-02-10  SUBJECTIVE:   Patient states she is doing well today.  She denies any chest pain, shortness of breath, or palpitations.  Her lower extremity edema is improving.  REVIEW OF SYSTEMS:   Review of Systems  Constitutional: Negative for chills, fever and weight loss.  HENT: Negative for ear discharge, ear pain and nosebleeds.   Eyes: Negative for blurred vision, pain and discharge.  Respiratory: Negative for sputum production, shortness of breath, wheezing and stridor.   Cardiovascular: Positive for leg swelling. Negative for chest pain, palpitations, orthopnea and PND.  Gastrointestinal: Negative for abdominal pain, diarrhea, nausea and vomiting.  Genitourinary: Negative for frequency and urgency.  Musculoskeletal: Negative for back pain and joint pain.  Neurological: Negative for sensory change, speech change, focal weakness and weakness.  Psychiatric/Behavioral: Negative for depression and hallucinations. The patient is not nervous/anxious.    DRUG ALLERGIES:   Allergies  Allergen Reactions  . Spironolactone     Hyperkalemia     VITALS:  Blood pressure 108/81, pulse 72, temperature 97.8 F (36.6 C), temperature source Oral, resp. rate 19, height 5\' 7"  (1.702 m), weight 121.4 kg, SpO2 94 %.  PHYSICAL EXAMINATION:   Physical Exam  GENERAL:  68 y.o.-year-old patient lying in the bed with no acute distress. obese EYES: Pupils equal, round, reactive to light and accommodation. No scleral icterus. Extraocular muscles intact.  HEENT: Head atraumatic, normocephalic. Oropharynx and nasopharynx clear.  NECK:  Supple. No thyroid enlargement, no tenderness.  LUNGS: + Diminished breath sounds in the lung bases bilaterally.  Nasal cannula in place. No use of accessory muscles of respiration.  CARDIOVASCULAR: Irregularly irregular rhythm, regular rate,  S1, S2 normal. No murmurs, rubs, or gallops.  ABDOMEN: Soft, nontender, nondistended. Bowel sounds present. No organomegaly or mass.  EXTREMITIES: No cyanosis, clubbing. Trace pitting edema in the lower extremities bilaterally, much improved. NEUROLOGIC: Cranial nerves II through XII are intact. No focal Motor or sensory deficits b/l.   PSYCHIATRIC:  patient is alert and oriented x 3.  SKIN: No obvious rash, lesion, or ulcer.   LABORATORY PANEL:  CBC Recent Labs  Lab 02/07/19 0334  WBC 7.1  HGB 9.2*  HCT 30.1*  PLT 212    Chemistries  Recent Labs  Lab 02/07/19 0334  NA 133*  K 4.3  CL 89*  CO2 30  GLUCOSE 147*  BUN 59*  CREATININE 2.07*  CALCIUM 9.2  MG 2.3   Cardiac Enzymes No results for input(s): TROPONINI in the last 168 hours. RADIOLOGY:  No results found. ASSESSMENT AND PLAN:   Acute hypoxic respiratory failure secondary to acute on chronic systolic and diastolic congestive heart failure- patient is likely approaching her dry weight if not already there. -ECHO with EF 25-30% -Off milrinone drop as of 10/29 -Transition to p.o. torsemide 40 mg daily today -Start low-dose losartan -Start digoxin per cards -Cardiology following  -Continue metoprolol -Wean O2 as able  A-fib with RVR- HRs much improved today -Started on digoxin -Continue metoprolol -Trial of eliquis today -Cardiology will start amiodarone in 3-4 weeks with plan for repeat cardioversion 2 weeks after that  Melena- resolved -Patient was supposed to have EGD on 01/25/2019, but patient became hypoxemic -Trial of Eliquis today -Will give a dose of IV iron today -Will monitor H/H closely -Needs bleeding scan if she has rebleeding  Acute blood loss anemia-  hemoglobin low but stable -Will give a dose of IV iron today -s/p multiple units PRBC -Transfuse for Hgb < 8  AKI in CKD III- due to cardiorenal syndrome. Creatinine continues to bump. -Nephrology following -Change to po torsemide  today  Type 2 diabetes -Continue Lantus and SSI  History of CVA -Started on eliquis today  Case discussed with Care Management/Social Worker. Management plans discussed with the patient, family and they are in agreement.  CODE STATUS: full  DVT Prophylaxis: SCD  TOTAL TIME TAKING CARE OF THIS PATIENT: 35 minutes.  >50% time spent on counselling and coordination of care  POSSIBLE D/C IN 2-3 DAYS, DEPENDING ON CLINICAL CONDITION.  Note: This dictation was prepared with Dragon dictation along with smaller phrase technology. Any transcriptional errors that result from this process are unintentional.  Evette Doffing M.D on 02/07/2019 at 2:36 PM  Between 7am to 6pm - Pager [phone]  After 6pm go to [email or web address] - password [email or web address] [facility]  Office  [phone]  CC: Primary care physician; [name], MDPatient ID: [name], female   DOB: [birth year], 68 y.o.   MRN: [id number]

## 2019-02-07 NOTE — Progress Notes (Signed)
Progress Note  Patient Name: Meghan [name] Date of Encounter: 02/07/2019  Primary Cardiologist: Kathlyn Sacramento, MD   Subjective   Feels well no complaints Daughter at the bedside Reports she is able to stand up with no difficulty, has been walking around the room Denies any tachycardia, shortness of breath Abdomen feels soft, no leg edema Last weight 124 kg  Inpatient Medications    Scheduled Meds: . sodium chloride   Intravenous Once  . atorvastatin  40 mg Oral Daily  . Chlorhexidine Gluconate Cloth  6 each Topical Q0600  . furosemide  40 mg Intravenous BID  . insulin aspart  0-5 Units Subcutaneous QHS  . insulin aspart  0-9 Units Subcutaneous TID WC  . insulin aspart  12 Units Subcutaneous TID WC  . insulin glargine  25 Units Subcutaneous QHS  . levothyroxine  50 mcg Oral QAC breakfast  . metoprolol tartrate  25 mg Oral BID  . multivitamin with minerals  1 tablet Oral Daily  . pantoprazole  40 mg Intravenous Q12H  . predniSONE  20 mg Oral Q breakfast  . sodium chloride flush  3 mL Intravenous Q12H   Continuous Infusions: . sodium chloride 0 mL (01/17/19 1010)   PRN Meds: sodium chloride, acetaminophen, HYDROcodone-acetaminophen, loperamide, ondansetron (ZOFRAN) IV, polyethylene glycol, sodium chloride flush   Vital Signs    Vitals:   02/07/19 0700 02/07/19 0735 02/07/19 0800 02/07/19 0954  BP:  113/77 104/85 108/81  Pulse:    64  Resp: 15 20 16 19   Temp:  98.6 F (37 C)  97.8 F (36.6 C)  TempSrc:  Axillary  Oral  SpO2:  92% 94% 94%  Weight:      Height:        Intake/Output Summary (Last 24 hours) at 02/07/2019 1200 Last data filed at 02/07/2019 0800 Gross per 24 hour  Intake 861.89 ml  Output 1250 ml  Net -388.11 ml   Last 3 Weights 02/06/2019 02/06/2019 02/05/2019  Weight (lbs) 267 lb 10.2 oz 252 lb 13.9 oz 254 lb 3.1 oz  Weight (kg) 121.4 kg 114.7 kg 115.3 kg      Telemetry    Atrial flutter, heart rate 80 to 90- Personally Reviewed    Physical Exam   Constitutional:  oriented to person, place, and time. No distress. obese HENT:  Head: Normocephalic and atraumatic.  Eyes:  no discharge. No scleral icterus.  Neck: Normal range of motion. Neck supple. No JVD present.  Cardiovascular: Normal rate, regular rhythm, normal heart sounds and intact distal pulses. Exam reveals no gallop and no friction rub. No edema No murmur heard. Pulmonary/Chest: Effort normal and breath sounds normal. No stridor. No respiratory distress.  no wheezes.  no rales.  no tenderness.  Abdominal: Soft.  no distension.  no tenderness.  Musculoskeletal: Normal range of motion.  no  tenderness or deformity.  Neurological:  normal muscle tone. Coordination normal. No atrophy Skin: Skin is warm and dry. No rash noted. not diaphoretic.  Psychiatric:  normal mood and affect. behavior is normal. Thought content normal.    Labs    High Sensitivity Troponin:   Recent Labs  Lab 01/17/19 0011  TROPONINIHS 13      Chemistry Recent Labs  Lab 02/03/19 0552  02/05/19 0427 02/06/19 0337 02/07/19 0334  NA 133*   < > 133* 132* 133*  K 4.1   < > 4.3 4.5 4.3  CL 90*   < > 89* 90* 89*  CO2 31   < >  34* 33* 30  GLUCOSE 162*   < > 207* 288* 147*  BUN 54*   < > 54* 56* 59*  CREATININE 1.86*   < > 1.99* 2.02* 2.07*  CALCIUM 8.7*   < > 8.9 8.8* 9.2  ALBUMIN 3.3*  --   --   --   --   GFRNONAA 27*   < > 25* 24* 24*  GFRAA 31*   < > 29* 28* 27*  ANIONGAP 12   < > 10 9 14    < > = values in this interval not displayed.     Hematology Recent Labs  Lab 02/05/19 0427 02/06/19 0337 02/07/19 0334  WBC 6.5 6.3 7.1  RBC 3.27* 3.24* 3.56*  HGB 8.5* 8.3* 9.2*  HCT 27.4* 27.2* 30.1*  MCV 83.8 84.0 84.6  MCH 26.0 25.6* 25.8*  MCHC 31.0 30.5 30.6  RDW 18.6* 18.7* 18.5*  PLT 197 204 212    BNP Recent Labs  Lab 02/01/19 0425  BNP 563.0*     DDimer No results for input(s): DDIMER in the last 168 hours.   Radiology    No results found.  Cardiac  Studies   2D echo 12/14/2018: 1. The left ventricle has severely reduced systolic function, with an ejection fraction of 25-30%. The cavity size was mildly dilated. Left ventricular diastolic Doppler parameters are indeterminate. Left ventricular diffuse hypokinesis. 2. The right ventricle has mildly reduced systolic function. The cavity was moderately enlarged. There is no increase in right ventricular wall thickness. Right ventricular systolic pressure is moderately elevated with an estimated pressure of 56.2  mmHg. 3. Left atrial size was severely dilated. 4. Right atrial size was moderately dilated. 5. Tricuspid valve regurgitation is moderate. 6. Rhythm is atrial fibrillation  Patient Profile     68 y.o. female history of CAD, ischemic cardiomyopathy last ejection fraction 25 to 30%, atrial flutter/atrial fibrillation, anemia being evaluated due to fluid overload.  Assessment & Plan   Ischemic cardiomyopathy, last EF 25 to 30%.   Weaned off milrinone yesterday Still on IV Lasix , renal function stable Will transition to torsemide 40 twice daily Will start losartan 12.5 dail Digoxin 0..625. (low dose given renal failure)  A. fib/flutter.   Poorly controlled heart rate locked in at 120 bpm yesterday Improved after weaning off milrinone Given dose of digoxin, changed to metoprolol tartrate Given rate well controlled and managed medically/rate control start anticoagulation today, previously on warfarin Long discussion with patient and daughter, will start elqiuis 5 BID Starting tonight Plan to restart amiodarone in 3 to 4 weeks, repeat cardioversion 2 weeks after that  Long discussion with family concerning issues with arrhythmia/anemia, GI bleed Greater than 50% was spent in counseling and coordination of care with patient Total encounter time 35 minutes or more      Signed, Ida Rogue, MD  02/07/2019, 12:00 PM

## 2019-02-07 NOTE — TOC Progression Note (Signed)
Transition of Care [facility]) - Progression Note    Patient Details  Name: Hyatt Brodrick MRN: [id number] Date of Birth: 04-Feb-1951  Transition of Care [facility]) CM/SW Contact  Ross Ludwig, [place] Phone Number: 02/07/2019, 9:08 PM  Clinical Narrative:    PT still recommending home health, CSW continuing to follow patient's progress throughout discharge plan.   Expected Discharge Plan: [place] Barriers to Discharge: Continued Medical Work up  Expected Discharge Plan and Services Expected Discharge Plan: [place] In-house Referral: Clinical Social Work   Post Acute Care Choice: [place] arrangements for the past 2 months: [place]: RN, PT, OT [place] Agency: [place] ([facility]) Date [place]: 01/26/19 Time [place]: 1602 Representative spoke with at [place]: [place] ([place]) Interventions    Readmission Risk Interventions Readmission Risk Prevention Plan 12/14/2018  Transportation Screening Complete  PCP or Specialist Appt within 5-7 Days Complete  Home Care Screening Complete  Medication Review (RN CM) Complete  Some recent data might be hidden

## 2019-02-07 NOTE — Progress Notes (Signed)
[facility], [place] 02/07/19  Subjective:   Patient is doing fair Diuresing well with IV Lasix,  Able to eat without nausea or vomiting.  Ate 100% of her breakfast  Milrinone drip has been weaned off No chest pain or SOB O2 by [place] Leg Edema has reduced  10/29 0701 - 10/30 0700 In: 621.9 [P.O.:530; I.V.:91.9] Out: 3400 [Urine:3400]   Objective:  Vital signs in last 24 hours:  Temp:  [97.8 F (36.6 C)-98.6 F (37 C)] 98.6 F (37 C) (10/30 0735) Pulse Rate:  [110-125] 114 (10/29 1700) Resp:  [12-24] 16 (10/30 0800) BP: (104-132)/(61-95) 104/85 (10/30 0800) SpO2:  [92 %-97 %] 94 % (10/30 0800) Weight:  [121.4 kg] 121.4 kg (10/29 1000)  Weight change: 6.7 kg Filed Weights   02/05/19 0500 02/06/19 0500 02/06/19 1000  Weight: 115.3 kg 114.7 kg 121.4 kg    Intake/Output:    Intake/Output Summary (Last 24 hours) at 02/07/2019 0846 Last data filed at 02/07/2019 0800 Gross per 24 hour  Intake 1101.89 ml  Output 3400 ml  Net -2298.11 ml     Physical Exam: General:  Sitting up in the bed, no acute distress  HEENT  moist oral mucous membranes  Pulm/lungs  bilateral crackles, [place] O2  CVS/Heart  atrial flutter, irregular rhythm  Abdomen:   Soft, nontender  Extremities:  trace pitting edema    Neurologic:  Alert, oriented  Skin:  Warm, dry    Basic Metabolic Panel:  Recent Labs  Lab 02/01/19 0425 02/02/19 0422 02/03/19 0552 02/04/19 0345 02/05/19 0427 02/06/19 0337 02/07/19 0334  NA 134* 134* 133* 135 133* 132* 133*  K 4.2 3.8 4.1 4.2 4.3 4.5 4.3  CL 92* 91* 90* 91* 89* 90* 89*  CO2 33* 31 31 36* 34* 33* 30  GLUCOSE 224* 204* 162* 139* 207* 288* 147*  BUN 61* 56* 54* 54* 54* 56* 59*  CREATININE 2.03* 2.05* 1.86* 1.80* 1.99* 2.02* 2.07*  CALCIUM 8.8* 8.6* 8.7* 8.9 8.9 8.8* 9.2  MG 2.2 2.2  --  2.2 2.1 2.1 2.3  PHOS 2.9  --   --  3.5  --   --   --      CBC: Recent Labs  Lab 02/01/19 0425 02/02/19 0422 02/04/19 0345  02/05/19 0427 02/06/19 0337 02/07/19 0334  WBC 7.3 6.8 7.1 6.5 6.3 7.1  NEUTROABS 5.4 5.0  --   --   --   --   HGB 8.2* 8.1* 8.3* 8.5* 8.3* 9.2*  HCT 26.8* 26.4* 26.4* 27.4* 27.2* 30.1*  MCV 85.4 84.6 83.5 83.8 84.0 84.6  PLT 221 205 198 197 204 212     No results found for: HEPBSAG, HEPBSAB, HEPBIGM    Microbiology:  No results found for this or any previous visit (from the past 240 hour(s)).  Coagulation Studies: Recent Labs    02/05/19 0427 02/06/19 0337 02/07/19 0334  LABPROT 14.3 14.3 13.9  INR 1.1 1.1 1.1    Urinalysis: No results for input(s): COLORURINE, LABSPEC, PHURINE, GLUCOSEU, HGBUR, BILIRUBINUR, KETONESUR, PROTEINUR, UROBILINOGEN, NITRITE, LEUKOCYTESUR in the last 72 hours.  Invalid input(s): APPERANCEUR    Imaging: No results found.   Medications:   . sodium chloride 0 mL (01/17/19 1010)  . milrinone Stopped (02/06/19 1740)   . sodium chloride   Intravenous Once  . atorvastatin  40 mg Oral Daily  . Chlorhexidine Gluconate Cloth  6 each Topical Q0600  . furosemide  40 mg Intravenous BID  . insulin aspart  0-5 Units Subcutaneous QHS  . insulin aspart  0-9 Units Subcutaneous TID WC  . insulin aspart  12 Units Subcutaneous TID WC  . insulin glargine  25 Units Subcutaneous QHS  . levothyroxine  50 mcg Oral QAC breakfast  . metoprolol tartrate  25 mg Oral BID  . multivitamin with minerals  1 tablet Oral Daily  . pantoprazole  40 mg Intravenous Q12H  . predniSONE  20 mg Oral Q breakfast  . sodium chloride flush  3 mL Intravenous Q12H   sodium chloride, acetaminophen, HYDROcodone-acetaminophen, loperamide, ondansetron (ZOFRAN) IV, polyethylene glycol, sodium chloride flush  Assessment/ Plan:  68 y.o. Caucasian female with morbid obesity,chronic systolic congestive heart failure, coronary disease, atrial fibrillation requiring anticoagulation, diabetes, anemia, carotid artery disease with history of left carotid endarterectomy, previous stroke,  hypertension, chronic kidney disease, was admitted on 01/17/2019 with shortness of breath worsening edema, acute kidney injury.   1.  Acute kidney injury 2.  Chronic kidney disease stage IIIb. Baseline Cr 1.70/gfr 30 from 12/15/2018 3.   Pulmonary edema-does not use oxygen at home 4.  Lower extremity edema 5.  Severe anemia 6.  Diabetes, insulin-dependent.  Hemoglobin A1c 7.4% December 14, 2018 7.  Hematuria 8.  Proteinuria 9.  Acute on  chronic systolic congestive heart failure LVEF 25 to 30% 10.  Pulmonary hypertension 11.  Atrial fibrillation requiring anticoagulation 12.  Hyponatremia  Abdominal imaging - diffuse lumbar spine degeneration, fat stranding around pancreas from generalized volume overload, ascites, cardiomegaly and atherosclerosis  2D echo from September 5 shows severely reduced LVEF 20 to 30% mildly dilated left ventricular cavity, diffuse hypokinesis, moderately enlarged right ventricular cavity with elevated pulmonary pressures of 56 mm, severely dilated left atrium, moderately dilated right atrium, moderate tricuspid regurgitation  Acute kidney injury is likely secondary to cardiorenal syndrome.  Patient has relative hypotension which may be leading to renal hypoperfusion.  She has underlying chronic kidney disease secondary to diabetes and hypertension.  Patient is also noted to have severe chronic congestive heart failure and hypoalbuminemia.  Recommend: -Good response to IV furosemide,  -Albumin of 3.3 on October 26. dsicontinued -Milrinone infusion has been discontinued. - daily standing weight .  10/29 = 121.4 kg - patient reports Dry weight = 250 lbs (113.6 kg)     LOS: [place] 10/30/20208:46 AM  [place], [place]  Note: This note was prepared with Dragon dictation. Any transcription errors are unintentional

## 2019-02-08 DIAGNOSIS — I5043 Acute on chronic combined systolic (congestive) and diastolic (congestive) heart failure: Secondary | ICD-10-CM | POA: Diagnosis not present

## 2019-02-08 DIAGNOSIS — D649 Anemia, unspecified: Secondary | ICD-10-CM | POA: Diagnosis not present

## 2019-02-08 DIAGNOSIS — N179 Acute kidney failure, unspecified: Secondary | ICD-10-CM | POA: Diagnosis not present

## 2019-02-08 DIAGNOSIS — N049 Nephrotic syndrome with unspecified morphologic changes: Secondary | ICD-10-CM | POA: Diagnosis not present

## 2019-02-08 DIAGNOSIS — N1832 Chronic kidney disease, stage 3b: Secondary | ICD-10-CM

## 2019-02-08 DIAGNOSIS — I25118 Atherosclerotic heart disease of native coronary artery with other forms of angina pectoris: Secondary | ICD-10-CM

## 2019-02-08 LAB — BASIC METABOLIC PANEL
Anion gap: 11 (ref 5–15)
BUN: 67 mg/dL — ABNORMAL HIGH (ref 8–23)
CO2: 34 mmol/L — ABNORMAL HIGH (ref 22–32)
Calcium: 8.9 mg/dL (ref 8.9–10.3)
Chloride: 92 mmol/L — ABNORMAL LOW (ref 98–111)
Creatinine, Ser: 1.98 mg/dL — ABNORMAL HIGH (ref 0.44–1.00)
GFR calc Af Amer: 29 mL/min — ABNORMAL LOW (ref >60)
GFR calc non Af Amer: 25 mL/min — ABNORMAL LOW (ref >60)
Glucose, Bld: 73 mg/dL (ref 70–99)
Potassium: 4.3 mmol/L (ref 3.5–5.1)
Sodium: 137 mmol/L (ref 135–145)

## 2019-02-08 LAB — CBC
HCT: 30 % — ABNORMAL LOW (ref 36.0–46.0)
Hemoglobin: 9 g/dL — ABNORMAL LOW (ref 12.0–15.0)
MCH: 25.6 pg — ABNORMAL LOW (ref 26.0–34.0)
MCHC: 30 g/dL (ref 30.0–36.0)
MCV: 85.5 fL (ref 80.0–100.0)
Platelets: 203 10*3/uL (ref 150–400)
RBC: 3.51 MIL/uL — ABNORMAL LOW (ref 3.87–5.11)
RDW: 18.9 % — ABNORMAL HIGH (ref 11.5–15.5)
WBC: 6.5 10*3/uL (ref 4.0–10.5)
nRBC: 0 % (ref 0.0–0.2)

## 2019-02-08 LAB — PROTIME-INR
INR: 1.2 (ref 0.8–1.2)
Prothrombin Time: 15.3 seconds — ABNORMAL HIGH (ref 11.4–15.2)

## 2019-02-08 LAB — GLUCOSE, CAPILLARY
Glucose-Capillary: 81 mg/dL (ref 70–99)
Glucose-Capillary: 123 mg/dL — ABNORMAL HIGH (ref 70–99)

## 2019-02-08 MED ORDER — TORSEMIDE 20 MG PO TABS: 40.0000 mg | ORAL_TABLET | Freq: Two times a day (BID) | ORAL | Status: DC

## 2019-02-08 MED ORDER — APIXABAN 5 MG PO TABS: 5.0000 mg | ORAL_TABLET | Freq: Two times a day (BID) | ORAL | 0 refills | Status: DC

## 2019-02-08 MED ORDER — METOPROLOL TARTRATE 25 MG PO TABS: 25.0000 mg | ORAL_TABLET | Freq: Two times a day (BID) | ORAL | 0 refills | Status: DC

## 2019-02-08 MED ORDER — TORSEMIDE 20 MG PO TABS: 40.0000 mg | ORAL_TABLET | Freq: Every day | ORAL | Status: DC

## 2019-02-08 MED ORDER — LOSARTAN POTASSIUM 25 MG PO TABS: 12.5000 mg | ORAL_TABLET | Freq: Every day | ORAL | 0 refills | Status: DC

## 2019-02-08 MED ORDER — DIGOXIN 62.5 MCG PO TABS: 0.0625 mg | ORAL_TABLET | Freq: Every day | ORAL | 0 refills | Status: DC

## 2019-02-08 MED ORDER — PANTOPRAZOLE SODIUM 40 MG PO TBEC: 40.0000 mg | DELAYED_RELEASE_TABLET | Freq: Every day | ORAL | 0 refills | Status: AC

## 2019-02-08 NOTE — TOC Transition Note (Signed)
Transition of Care [facility]) - CM/SW Discharge Note   Patient Details  Name: Meghan [name] MRN: [id number] Date of Birth: 15-Aug-1950  Transition of Care [facility]) CM/SW Contact:  Latanya Maudlin, RN Phone Number: 02/08/2019, 12:58 PM   Clinical Narrative:  Patient to be discharged per MD order. Orders in place for home health services. Referral was previously given to [place] care. Notified [place] of plans for discharge. No DME needs. Family to transport.     Final next level of care: [place] Barriers to Discharge: No Barriers Identified   Patient Goals and CMS Choice Patient states their goals for this hospitalization and ongoing recovery are:: To go home.      Discharge Placement                       Discharge Plan and Services In-house Referral: Clinical Social Work   Post Acute Care Choice: Home Health                    HH Arranged: PT, RN [facility] Agency: [place] ([facility]) Date [place]: 02/08/19 Time [place]: 1258 Representative spoke with at [place]: [place] ([place]) Interventions     Readmission Risk Interventions Readmission Risk Prevention Plan 12/14/2018  Transportation Screening Complete  PCP or Specialist Appt within 5-7 Days Complete  Home Care Screening Complete  Medication Review (RN CM) Complete  Some recent data might be hidden

## 2019-02-08 NOTE — Progress Notes (Signed)
[facility], [place] 02/08/19  Subjective:   Patient is doing fair Diuresing well   Milrinone drip has been weaned off No chest pain or SOB O2 by [employer] Leg Edema has reduced  10/30 0701 - 10/31 0700 In: 1200 [P.O.:1200] Out: 1800 [Urine:1800]   Objective:  Vital signs in last 24 hours:  Temp:  [97.5 F (36.4 C)-98.3 F (36.8 C)] 97.5 F (36.4 C) (10/31 0933) Pulse Rate:  [61-72] 62 (10/31 1034) Resp:  [18-20] 18 (10/31 0933) BP: (89-139)/(56-82) 100/57 (10/31 1034) SpO2:  [91 %-98 %] 91 % (10/31 0933) Weight:  [117.5 kg] 117.5 kg (10/31 0427)  Weight change: -3.919 kg Filed Weights   02/06/19 0500 02/06/19 1000 02/08/19 0427  Weight: 114.7 kg 121.4 kg 117.5 kg    Intake/Output:    Intake/Output Summary (Last 24 hours) at 02/08/2019 1116 Last data filed at 02/08/2019 0429 Gross per 24 hour  Intake 720 ml  Output 1800 ml  Net -1080 ml     Physical Exam: General:  Sitting up in the bed, no acute distress  HEENT  moist oral mucous membranes  Pulm/lungs  clear to auscultation today except for mild fine crackles at bases, [place] O2  CVS/Heart  atrial fibrillation/flutter, irregular rhythm  Abdomen:   Soft, nontender  Extremities:  trace pitting edema    Neurologic:  Alert, oriented  Skin:  Warm, dry    Basic Metabolic Panel:  Recent Labs  Lab 02/02/19 0422  02/04/19 0345 02/05/19 0427 02/06/19 0337 02/07/19 0334 02/08/19 0550  NA 134*   < > 135 133* 132* 133* 137  K 3.8   < > 4.2 4.3 4.5 4.3 4.3  CL 91*   < > 91* 89* 90* 89* 92*  CO2 31   < > 36* 34* 33* 30 34*  GLUCOSE 204*   < > 139* 207* 288* 147* 73  BUN 56*   < > 54* 54* 56* 59* 67*  CREATININE 2.05*   < > 1.80* 1.99* 2.02* 2.07* 1.98*  CALCIUM 8.6*   < > 8.9 8.9 8.8* 9.2 8.9  MG 2.2  --  2.2 2.1 2.1 2.3  --   PHOS  --   --  3.5  --   --   --   --    < > = values in this interval not displayed.     CBC: Recent Labs  Lab 02/02/19 0422 02/04/19 0345  02/05/19 0427 02/06/19 0337 02/07/19 0334 02/08/19 0550  WBC 6.8 7.1 6.5 6.3 7.1 6.5  NEUTROABS 5.0  --   --   --   --   --   HGB 8.1* 8.3* 8.5* 8.3* 9.2* 9.0*  HCT 26.4* 26.4* 27.4* 27.2* 30.1* 30.0*  MCV 84.6 83.5 83.8 84.0 84.6 85.5  PLT 205 198 197 204 212 203     No results found for: HEPBSAG, HEPBSAB, HEPBIGM    Microbiology:  No results found for this or any previous visit (from the past 240 hour(s)).  Coagulation Studies: Recent Labs    02/06/19 0337 02/07/19 0334 02/08/19 0546  LABPROT 14.3 13.9 15.3*  INR 1.1 1.1 1.2    Urinalysis: No results for input(s): COLORURINE, LABSPEC, PHURINE, GLUCOSEU, HGBUR, BILIRUBINUR, KETONESUR, PROTEINUR, UROBILINOGEN, NITRITE, LEUKOCYTESUR in the last 72 hours.  Invalid input(s): APPERANCEUR    Imaging: No results found.   Medications:   . sodium chloride 0 mL (01/17/19 1010)   . sodium chloride   Intravenous Once  . apixaban  5  mg Oral BID  . atorvastatin  40 mg Oral Daily  . Chlorhexidine Gluconate Cloth  6 each Topical Q0600  . digoxin  0.0625 mg Oral Daily  . insulin aspart  0-5 Units Subcutaneous QHS  . insulin aspart  0-9 Units Subcutaneous TID WC  . insulin aspart  12 Units Subcutaneous TID WC  . insulin glargine  25 Units Subcutaneous QHS  . levothyroxine  50 mcg Oral QAC breakfast  . losartan  12.5 mg Oral Daily  . metoprolol tartrate  25 mg Oral BID  . multivitamin with minerals  1 tablet Oral Daily  . pantoprazole  40 mg Intravenous Q12H  . predniSONE  20 mg Oral Q breakfast  . sodium chloride flush  3 mL Intravenous Q12H  . [START ON 02/09/2019] torsemide  40 mg Oral BID   sodium chloride, acetaminophen, HYDROcodone-acetaminophen, loperamide, ondansetron (ZOFRAN) IV, polyethylene glycol, sodium chloride flush  Assessment/ Plan:  68 y.o. Caucasian female with morbid obesity,chronic systolic congestive heart failure, coronary disease, atrial fibrillation requiring anticoagulation, diabetes, anemia,  carotid artery disease with history of left carotid endarterectomy, previous stroke, hypertension, chronic kidney disease, was admitted on 01/17/2019 with shortness of breath worsening edema, acute kidney injury.   1.  Acute kidney injury 2.  Chronic kidney disease stage IIIb. Baseline Cr 1.70/gfr 30 from 12/15/2018 3.   Pulmonary edema-does not use oxygen at home 4.  Lower extremity edema 5.  Severe anemia 6.  Diabetes, insulin-dependent.  Hemoglobin A1c 7.4% December 14, 2018 7.  Hematuria 8.  Proteinuria 9.  Acute on  chronic systolic congestive heart failure LVEF 25 to 30% 10.  Pulmonary hypertension 11.  Atrial fibrillation requiring anticoagulation 12.  Hyponatremia  Abdominal imaging - diffuse lumbar spine degeneration, fat stranding around pancreas from generalized volume overload, ascites, cardiomegaly and atherosclerosis  2D echo from September 5 shows severely reduced LVEF 20 to 30% mildly dilated left ventricular cavity, diffuse hypokinesis, moderately enlarged right ventricular cavity with elevated pulmonary pressures of 56 mm, severely dilated left atrium, moderately dilated right atrium, moderate tricuspid regurgitation  Acute kidney injury is likely secondary to cardiorenal syndrome.  Patient has relative hypotension which may be leading to renal hypoperfusion.  She has underlying chronic kidney disease secondary to diabetes and hypertension.  Patient is also noted to have severe chronic congestive heart failure and hypoalbuminemia.  Recommend: -Good response to IV furosemide, change to oral torsemide -Albumin of 3.3 on October 26. dsicontinued IV supplementation -Milrinone infusion has been discontinued. - daily standing weight .  10/29 = 121.4 kg - patient reports Dry weight = 250 lbs (113.6 kg)     LOS: [place] 10/31/202011:16 AM  [place], [place]  Note: This note was prepared with Dragon dictation. Any  transcription errors are unintentional

## 2019-02-08 NOTE — Progress Notes (Signed)
Physical Therapy Treatment Patient Details Name: Meghan [name] MRN: [id number] DOB: 19-Jun-1950 Today's Date: 02/08/2019    History of Present Illness 67 y.o. 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  MD assessment includes: Progressive cardio-renal syndrome in setting of morbid obesity, systolic CHF with EF [id number], GI bleed, AKI/renal failure, and severe hypoglycemia. RESPIRATORY FAILURE from EGD sedation, now resolved    PT Comments    Pt was seen for update of orders and reassessment s/p trip to critical care after EGD.  Her abilities are much better and hopes to go directly home for rest, then to outpatient therapy.  Pt will remain on PT caseload until she returns home with daughter, and while both feel she does not need therapy, PT talked with her about using a service due to some deficits noted in this eval.  Pt will go home and rest, then will think about exercising the option of the new orders to come.  Follow acutely and will expect pt to decline outpatient therapy based on her feelings about it.   Follow Up Recommendations  Supervision for mobility/OOB;Outpatient PT     Equipment Recommendations  None recommended by PT    Recommendations for Other Services       Precautions / Restrictions Precautions Precautions: Fall Precaution Comments: Keep O2 >/= 92% Restrictions Weight Bearing Restrictions: No    Mobility  Bed Mobility Overal bed mobility: Needs Assistance Bed Mobility: Supine to Sit;Sit to Supine     Supine to sit: Min assist Sit to supine: Min assist   General bed mobility comments: assisted trunk to get up and legs back to bed  Transfers Overall transfer level: Needs assistance Equipment used:  1 person hand held assist;Rolling walker (2 wheeled) Transfers: Sit to/from Stand Sit to Stand: Min assist         General transfer comment: cued push off bed and sidestepping  Ambulation/Gait Ambulation/Gait assistance: Min guard;Min assist Gait Distance (Feet): 10 Feet Assistive device: Rolling walker (2 wheeled) Gait Pattern/deviations: Step-to pattern;Decreased stride length;Wide base of support Gait velocity: decreased   General Gait Details: pt was using RW for mobility to chair and then [facility] mobile   Stairs             Wheelchair Mobility    Modified Rankin (Stroke Patients Only)       Balance Overall balance assessment: Needs assistance Sitting-balance support: Feet supported Sitting balance-Leahy Scale: Good     Standing balance support: Bilateral upper extremity supported;During functional activity Standing balance-Leahy Scale: Fair Standing balance comment: fair standing but then poor when she gives out from fatigue                            Cognition Arousal/Alertness: Awake/alert Behavior During Therapy: WFL for tasks assessed/performed Overall Cognitive Status: Within Functional Limits for tasks assessed                                        Exercises      General Comments General comments (skin integrity, edema, etc.): pt was assisted to stand up at walker and take sidesteps such as she  would need to do for BR or to get to [facility], min guard to move most of the time      Pertinent Vitals/Pain Pain Assessment: No/denies pain    Home Living                      Prior Function            PT Goals (current goals can now be found in the care plan section) Acute Rehab PT Goals Patient Stated Goal: home with daughter PT Goal Formulation: With patient/family Time For Goal Achievement: 02/12/19 Potential to Achieve Goals: Good Progress towards PT goals: Progressing toward goals    Frequency    Min  2X/week      PT Plan Discharge plan needs to be updated    Co-evaluation              AM-PAC PT "6 Clicks" Mobility   Outcome Measure  Help needed turning from your back to your side while in a flat bed without using bedrails?: None Help needed moving from lying on your back to sitting on the side of a flat bed without using bedrails?: A Little Help needed moving to and from a bed to a chair (including a wheelchair)?: A Little Help needed standing up from a chair using your arms (e.g., wheelchair or bedside chair)?: A Little Help needed to walk in hospital room?: A Little Help needed climbing 3-5 steps with a railing? : A Lot 6 Click Score: 18    End of Session Equipment Utilized During Treatment: Gait belt Activity Tolerance: Patient tolerated treatment well;Patient limited by fatigue Patient left: in bed;with call bell/phone within reach;with bed alarm set;with family/visitor present Nurse Communication: Mobility status(asked MD for outpatient PT order) PT Visit Diagnosis: Muscle weakness (generalized) (M62.81);Difficulty in walking, not elsewhere classified (R26.2)     Time: [id number] PT Time Calculation (min) (ACUTE ONLY): 23 min  Charges:  $Gait Training: 8-22 mins                    Ramond Dial 02/08/2019, 1:58 PM   Mee Hives, PT MS Acute Rehab Dept. Number: [place]

## 2019-02-08 NOTE — Discharge Summary (Signed)
[facility] [place] NAME: Meghan [name]    MR#:  [id number]  DATE OF BIRTH:  1950-05-12  DATE OF ADMISSION:  01/17/2019   ADMITTING PHYSICIAN: Lang Snow, NP  DATE OF DISCHARGE: 02/08/19  PRIMARY CARE PHYSICIAN: [name], MD   ADMISSION DIAGNOSIS:  Rectal bleeding [K62.5] SOB (shortness of breath) [R06.02] Chronic atrial fibrillation (HCC) [I48.20] Warfarin anticoagulation [Z79.01] Severe back pain [M54.9] Anasarca associated with disorder of kidney [N04.9] Anemia, unspecified type [D64.9] Acute renal failure superimposed on chronic kidney disease, unspecified CKD stage, unspecified acute renal failure type ([place]) [N17.9, N18.9] Acute on chronic congestive heart failure, unspecified heart failure type ([place]) [I50.9] DISCHARGE DIAGNOSIS:  Active Problems:   Acute on chronic systolic CHF (congestive heart failure) ([place])   Procedure not carried out because of contraindication   Melena  SECONDARY DIAGNOSIS:   Past Medical History:  Diagnosis Date   Carotid arterial disease ([place])    a. 02/2012 s/p L CEA.   CKD stage G3b/A1, GFR 30-44 and albumin creatinine ratio <30 mg/g    Coronary artery disease    a. 02/2012 Lexi MV: Anterior, anteroseptal, septal, apical infarct with mild peri-infarct ischemia, EF 41%- medically managed due to anemia; b. 03/2017 Cath: LM nl, LAD 99ost/p/m - faint L->L collaterals, RI 20ost, LCX 11m, RCA 20p, 69m-->Med Rx for chronic subtotal LAD dzs. Avoid R Radial access in future.   Diabetes mellitus without complication ([place])    Type II   Hyperlipidemia    Hypertension    Ischemic cardiomyopathy    a. 06/2013 Echo: EF 35-40%, glob HK, mildly dil LA, mild LVH, mild TR, mild to mod MR; b. 07/2016 Echo: EF 25-30%, sev mid-apicalanteroseptal, ant, apical HK, mild MR, sev dil LA, mod dil RA, mod TR, PASP 35mmHg; c. 11/2016 Echo: EF 20-25%, antsept DK, Gr2 DD, mild MR, mod dil LA/RV/RA, mod red RV  fxn, mod-sev TR, PASP 40-25mmHg.   Morbid obesity ([place])    Normocytic anemia    Persistent atrial fibrillation (HCC)    a. CHA2DS2VASc = 7-->coumadin; maintaining sinus on amio.   Stroke [facility] 2013   HOSPITAL COURSE:   Meghan [name] is a 68 year old female who presented to the ED with shortness of breath and GI bleeding. In the ED, CXR showed pulmonary edema. Hemoglobin was noted to be 6.7. She was admitted for further management.  Acute on chronic systolicand diastoliccongestive heart failure- patient diuresed 22L this admission. -ECHO with EF 25-30% -Treated with milrinone drip and was successfully weaned off  -Treated with lasix drip and then transitioned to torsemide 40mg  bid -Losartan was continued at a lower dose -Started on digoxin this admission -Changed from coreg to metoprolol 25mg  bid -PT recommended outpatient PT  Persistent atrial fibrillation- had RVR this admission, but this resolved. -Started on digoxin and metoprolol per cards -Switched from coumadin to eliquis 5mg  bid  -Cardiology will restart amiodarone in 3-4 weeks with plan for repeat cardioversion 2 weeks after that  Melena- resolved -Patient was supposed to have EGD on 01/25/2019, but patient became hypoxemic -Started on eliquis without any evidence of rebleeding -Protonix 40mg  daily was prescribed on discharge  Acute blood loss anemia- hemoglobin low but stable -s/p multiple units PRBC and IV iron x 1 -Needs CBC rechecked as an outpatient  AKI in CKD III- due to cardiorenal syndrome. Creatinine improved. -Recheck BMP as an outpatient  Type 2 diabetes -Home diabetes medicines were continued  Hyperlipidemia -Lipitor was continued  DISCHARGE CONDITIONS:  Acute on chronic systolic and diastolic congestive heart failure Persistent atrial fibrillation Acute blood loss anemia AKI in CKD III Type 2 diabetes Hyperlipidemia CONSULTS OBTAINED:  Treatment Team:  Murlean Iba, MD Minna Merritts, MD Hillary Bow, MD DRUG ALLERGIES:   Allergies  Allergen Reactions   Spironolactone     Hyperkalemia    DISCHARGE MEDICATIONS:   Allergies as of 02/08/2019      Reactions   Spironolactone    Hyperkalemia      Medication List    STOP taking these medications   amiodarone 200 MG tablet Commonly known as: PACERONE   carvedilol 6.25 MG tablet Commonly known as: COREG   warfarin 5 MG tablet Commonly known as: COUMADIN     TAKE these medications   apixaban 5 MG Tabs tablet Commonly known as: ELIQUIS Take 1 tablet (5 mg total) by mouth 2 (two) times daily.   atorvastatin 40 MG tablet Commonly known as: LIPITOR Take 1 tablet by mouth once daily   Digoxin 62.5 MCG Tabs Take 0.0625 mg by mouth daily. Start taking on: February 09, 2019   glipiZIDE 10 MG tablet Commonly known as: GLUCOTROL Take 10 mg by mouth 2 (two) times daily. Dr Honor Junes   Insulin Degludec 200 UNIT/ML Sopn Inject 25 Units into the skin daily at 12 noon. Dr Honor Junes (1300)   levothyroxine 50 MCG tablet Commonly known as: SYNTHROID Take 50 mcg by mouth daily before breakfast.   losartan 25 MG tablet Commonly known as: COZAAR Take 0.5 tablets (12.5 mg total) by mouth daily. Start taking on: February 09, 2019 What changed: how much to take   metoprolol tartrate 25 MG tablet Commonly known as: LOPRESSOR Take 1 tablet (25 mg total) by mouth 2 (two) times daily.   pantoprazole 40 MG tablet Commonly known as: Protonix Take 1 tablet (40 mg total) by mouth daily.   torsemide 20 MG tablet Commonly known as: DEMADEX Take 2 tablets (40 mg total) by mouth 2 (two) times daily.   triamcinolone cream 0.5 % Commonly known as: KENALOG Apply 1 application topically 2 (two) times daily as needed (psoriasis).        DISCHARGE INSTRUCTIONS:  1.  Follow-up with PCP in 5 days 2.  Follow-up with cardiology in 1 week 3.  Decrease losartan dose from 25 mg daily to 12.5 mg daily 4.  Stop  Coumadin and start Eliquis 5 mg daily 5.  Stop Coreg and start metoprolol 25 mg daily 6.  Started on digoxin this admission DIET:  Cardiac diet and Diabetic diet DISCHARGE CONDITION:  Stable ACTIVITY:  Activity as tolerated OXYGEN:  Home Oxygen: No.  Oxygen Delivery: room air DISCHARGE LOCATION:  home   If you experience worsening of your admission symptoms, develop shortness of breath, life threatening emergency, suicidal or homicidal thoughts you must seek medical attention immediately by calling 911 or calling your MD immediately  if symptoms less severe.  You Must read complete instructions/literature along with all the possible adverse reactions/side effects for all the Medicines you take and that have been prescribed to you. Take any new Medicines after you have completely understood and accpet all the possible adverse reactions/side effects.   Please note  You were cared for by a hospitalist during your hospital stay. If you have any questions about your discharge medications or the care you received while you were in the hospital after you are discharged, you can call the unit and asked to speak with the  hospitalist on call if the hospitalist that took care of you is not available. Once you are discharged, your primary care physician will handle any further medical issues. Please note that NO REFILLS for any discharge medications will be authorized once you are discharged, as it is imperative that you return to your primary care physician (or establish a relationship with a primary care physician if you do not have one) for your aftercare needs so that they can reassess your need for medications and monitor your lab values.    On the day of Discharge:  VITAL SIGNS:  Blood pressure (!) 107/55, pulse 64, temperature (!) 97.5 F (36.4 C), temperature source Oral, resp. rate 18, height 5\' 7"  (1.702 m), weight 117.5 kg, SpO2 91 %. PHYSICAL EXAMINATION:  GENERAL:  68 y.o.-year-old  patient lying in the bed with no acute distress.  EYES: Pupils equal, round, reactive to light and accommodation. No scleral icterus. Extraocular muscles intact.  HEENT: Head atraumatic, normocephalic. Oropharynx and nasopharynx clear.  NECK:  Supple, no jugular venous distention. No thyroid enlargement, no tenderness.  LUNGS: Normal breath sounds bilaterally, no wheezing, rales,rhonchi or crepitation. No use of accessory muscles of respiration.  CARDIOVASCULAR: Irregularly irregular rhythm, regular rate, S1, S2 normal. No murmurs, rubs, or gallops.  ABDOMEN: Soft, non-tender, non-distended. Bowel sounds present. No organomegaly or mass.  EXTREMITIES: No pedal edema, cyanosis, or clubbing.  NEUROLOGIC: Cranial nerves II through XII are intact. Muscle strength 5/5 in all extremities. Sensation intact. Gait not checked.  PSYCHIATRIC: The patient is alert and oriented x 3.  SKIN: No obvious rash, lesion, or ulcer.  DATA REVIEW:   CBC Recent Labs  Lab 02/08/19 0550  WBC 6.5  HGB 9.0*  HCT 30.0*  PLT 203    Chemistries  Recent Labs  Lab 02/07/19 0334 02/08/19 0550  NA 133* 137  K 4.3 4.3  CL 89* 92*  CO2 30 34*  GLUCOSE 147* 73  BUN 59* 67*  CREATININE 2.07* 1.98*  CALCIUM 9.2 8.9  MG 2.3  --      Microbiology Results  Results for orders placed or performed during the hospital encounter of 01/17/19  SARS Coronavirus 2 by RT PCR (hospital order, performed in [facility] hospital lab) Nasopharyngeal Nasopharyngeal Swab     Status: None   Collection Time: 01/17/19  1:41 AM   Specimen: Nasopharyngeal Swab  Result Value Ref Range Status   SARS Coronavirus 2 NEGATIVE NEGATIVE Final    Comment: (NOTE) If result is NEGATIVE SARS-CoV-2 target nucleic acids are NOT DETECTED. The SARS-CoV-2 RNA is generally detectable in upper and lower  respiratory specimens during the acute phase of infection. The lowest  concentration of SARS-CoV-2 viral copies this assay can detect is 250    copies / mL. A negative result does not preclude SARS-CoV-2 infection  and should not be used as the sole basis for treatment or other  patient management decisions.  A negative result may occur with  improper specimen collection / handling, submission of specimen other  than nasopharyngeal swab, presence of viral mutation(s) within the  areas targeted by this assay, and inadequate number of viral copies  (<250 copies / mL). A negative result must be combined with clinical  observations, patient history, and epidemiological information. If result is POSITIVE SARS-CoV-2 target nucleic acids are DETECTED. The SARS-CoV-2 RNA is generally detectable in upper and lower  respiratory specimens dur ing the acute phase of infection.  Positive  results are indicative of active infection  with SARS-CoV-2.  Clinical  correlation with patient history and other diagnostic information is  necessary to determine patient infection status.  Positive results do  not rule out bacterial infection or co-infection with other viruses. If result is PRESUMPTIVE POSTIVE SARS-CoV-2 nucleic acids MAY BE PRESENT.   A presumptive positive result was obtained on the submitted specimen  and confirmed on repeat testing.  While 2019 novel coronavirus  (SARS-CoV-2) nucleic acids may be present in the submitted sample  additional confirmatory testing may be necessary for epidemiological  and / or clinical management purposes  to differentiate between  SARS-CoV-2 and other Sarbecovirus currently known to infect humans.  If clinically indicated additional testing with an alternate test  methodology [phone]) is advised. The SARS-CoV-2 RNA is generally  detectable in upper and lower respiratory sp ecimens during the acute  phase of infection. The expected result is Negative. Fact Sheet for Patients:  StrictlyIdeas.no Fact Sheet for Healthcare  Providers: BankingDealers.co.za This test is not yet approved or cleared by the [place] FDA and has been authorized for detection and/or diagnosis of SARS-CoV-2 by FDA under an Emergency Use Authorization (EUA).  This EUA will remain in effect (meaning this test can be used) for the duration of the COVID-19 declaration under Section 564(b)(1) of the Act, 21 U.S.C. section 360bbb-3(b)(1), unless the authorization is terminated or revoked sooner. Performed at [facility], [address] [place], [place] [address]   MRSA PCR Screening     Status: None   Collection Time: 01/17/19  4:44 AM   Specimen: Nasal Mucosa; Nasopharyngeal  Result Value Ref Range Status   MRSA by PCR NEGATIVE NEGATIVE Final    Comment:        The GeneXpert MRSA Assay (FDA approved for NASAL specimens only), is one component of a comprehensive MRSA colonization surveillance program. It is not intended to diagnose MRSA infection nor to guide or monitor treatment for MRSA infections. Performed at [facility], [address] [place], [place] [address]   Urine Culture     Status: Abnormal   Collection Time: 01/17/19 11:52 AM   Specimen: Urine, Clean Catch  Result Value Ref Range Status   Specimen Description   Final    URINE, CLEAN CATCH Performed at [facility], [address], [place], [place] [address]    Special Requests   Final    Normal Performed at [facility], [place],  [address]    Culture >=100,000 COLONIES/mL ESCHERICHIA COLI (A)  Final   Report Status 01/19/2019 FINAL  Final   Organism ID, Bacteria ESCHERICHIA COLI (A)  Final      Susceptibility   Escherichia coli - MIC*    AMPICILLIN >=32 RESISTANT Resistant     CEFAZOLIN <=4 SENSITIVE Sensitive     CEFTRIAXONE <=1 SENSITIVE Sensitive     CIPROFLOXACIN >=4 RESISTANT Resistant     GENTAMICIN <=1 SENSITIVE Sensitive     IMIPENEM <=0.25 SENSITIVE  Sensitive     NITROFURANTOIN <=16 SENSITIVE Sensitive     TRIMETH/SULFA <=20 SENSITIVE Sensitive     AMPICILLIN/SULBACTAM 8 SENSITIVE Sensitive     PIP/TAZO <=4 SENSITIVE Sensitive     Extended ESBL NEGATIVE Sensitive     * >=100,000 COLONIES/mL ESCHERICHIA COLI    RADIOLOGY:  No results found.   Management plans discussed with the patient, family and they are in agreement.  CODE STATUS: Full Code   TOTAL TIME TAKING CARE OF THIS PATIENT: 40 minutes.  Berna Spare Marquesha Robideau M.D on 02/08/2019 at 12:47 PM  Between 7am to 6pm - Pager - [phone]  After 6pm go to [email or web address] - Proofreader  [facility]  Office  [phone]  CC: Primary care physician; [name], MD   Note: This dictation was prepared with Dragon dictation along with smaller phrase technology. Any transcriptional errors that result from this process are unintentional.

## 2019-02-08 NOTE — Progress Notes (Signed)
Progress Note  Patient Name: Meghan [name] Date of Encounter: 02/08/2019  Primary Cardiologist: Kathlyn Sacramento, MD   Subjective   Feels well, no complaints of shortness of breath abdominal bloating or leg swelling Daughter feels she is stronger better, ambulating much better than she ever has.  Overall family very happy Blood pressure running little bit low this morning 0000000 systolic Follow-up blood pressure at noon [id number] systolic Last weight in the computer 117.5 kg  Inpatient Medications    Scheduled Meds: . sodium chloride   Intravenous Once  . apixaban  5 mg Oral BID  . atorvastatin  40 mg Oral Daily  . Chlorhexidine Gluconate Cloth  6 each Topical Q0600  . digoxin  0.0625 mg Oral Daily  . insulin aspart  0-5 Units Subcutaneous QHS  . insulin aspart  0-9 Units Subcutaneous TID WC  . insulin aspart  12 Units Subcutaneous TID WC  . insulin glargine  25 Units Subcutaneous QHS  . levothyroxine  50 mcg Oral QAC breakfast  . losartan  12.5 mg Oral Daily  . metoprolol tartrate  25 mg Oral BID  . multivitamin with minerals  1 tablet Oral Daily  . pantoprazole  40 mg Intravenous Q12H  . predniSONE  20 mg Oral Q breakfast  . sodium chloride flush  3 mL Intravenous Q12H  . [START ON 02/09/2019] torsemide  40 mg Oral BID   Continuous Infusions: . sodium chloride 0 mL (01/17/19 1010)   PRN Meds: sodium chloride, acetaminophen, HYDROcodone-acetaminophen, loperamide, ondansetron (ZOFRAN) IV, polyethylene glycol, sodium chloride flush   Vital Signs    Vitals:   02/08/19 0427 02/08/19 0933 02/08/19 1034 02/08/19 1220  BP: 136/82 (!) 89/56 (!) 100/57 (!) 107/55  Pulse: 64 62 62 64  Resp: 20 18    Temp: 98.3 F (36.8 C) (!) 97.5 F (36.4 C)    TempSrc: Oral Oral    SpO2: 98% 91%    Weight: 117.5 kg     Height:        Intake/Output Summary (Last 24 hours) at 02/08/2019 1442 Last data filed at 02/08/2019 0429 Gross per 24 hour  Intake 480 ml  Output 1800 ml  Net -1320 ml    Last 3 Weights 02/08/2019 02/06/2019 02/06/2019  Weight (lbs) 259 lb 267 lb 10.2 oz 252 lb 13.9 oz  Weight (kg) 117.482 kg 121.4 kg 114.7 kg      Telemetry    Atrial flutter, heart rate 70 to  80 Personally Reviewed   Physical Exam   Constitutional:  oriented to person, place, and time. No distress.  HENT:  Head: Grossly normal Eyes:  no discharge. No scleral icterus.  Neck: No JVD, no carotid bruits  Cardiovascular: Irregular,  no murmurs appreciated Pulmonary/Chest: Clear to auscultation bilaterally, no wheezes or rails Abdominal: Soft.  no distension.  no tenderness.  Musculoskeletal: Normal range of motion Neurological:  normal muscle tone. Coordination normal. No atrophy Skin: Skin warm and dry Psychiatric: normal affect, pleasant   Labs    High Sensitivity Troponin:   Recent Labs  Lab 01/17/19 0011  TROPONINIHS 13      Chemistry Recent Labs  Lab 02/03/19 0552  02/06/19 0337 02/07/19 0334 02/08/19 0550  NA 133*   < > 132* 133* 137  K 4.1   < > 4.5 4.3 4.3  CL 90*   < > 90* 89* 92*  CO2 31   < > 33* 30 34*  GLUCOSE 162*   < > 288* 147* 73  BUN 54*   < > 56* 59* 67*  CREATININE 1.86*   < > 2.02* 2.07* 1.98*  CALCIUM 8.7*   < > 8.8* 9.2 8.9  ALBUMIN 3.3*  --   --   --   --   GFRNONAA 27*   < > 24* 24* 25*  GFRAA 31*   < > 28* 27* 29*  ANIONGAP 12   < > 9 14 11    < > = values in this interval not displayed.     Hematology Recent Labs  Lab 02/06/19 0337 02/07/19 0334 02/08/19 0550  WBC 6.3 7.1 6.5  RBC 3.24* 3.56* 3.51*  HGB 8.3* 9.2* 9.0*  HCT 27.2* 30.1* 30.0*  MCV 84.0 84.6 85.5  MCH 25.6* 25.8* 25.6*  MCHC 30.5 30.6 30.0  RDW 18.7* 18.5* 18.9*  PLT 204 212 203    BNP No results for input(s): BNP, PROBNP in the last 168 hours.   DDimer No results for input(s): DDIMER in the last 168 hours.   Radiology    No results found.  Cardiac Studies   2D echo 12/14/2018: 1. The left ventricle has severely reduced systolic function, with  an ejection fraction of 25-30%. The cavity size was mildly dilated. Left ventricular diastolic Doppler parameters are indeterminate. Left ventricular diffuse hypokinesis. 2. The right ventricle has mildly reduced systolic function. The cavity was moderately enlarged. There is no increase in right ventricular wall thickness. Right ventricular systolic pressure is moderately elevated with an estimated pressure of 56.2  mmHg. 3. Left atrial size was severely dilated. 4. Right atrial size was moderately dilated. 5. Tricuspid valve regurgitation is moderate. 6. Rhythm is atrial fibrillation  Patient Profile     68 y.o. female history of CAD, ischemic cardiomyopathy last ejection fraction 25 to 30%, atrial flutter/atrial fibrillation, anemia being evaluated due to fluid overload.  Assessment & Plan   Acute on chronic diastolic and systolic CHF  last EF 25 to 30%.   Completed milrinone infusion with IV Lasix Massive diuresis of 22.5 L Creatinine stable around 2 with climbing BUN, low blood pressure consistent with prerenal state.  Continue digoxin 0..625. ,  Losartan 12.5 mg daily, torsemide 40 twice daily  GI bleed Presenting with hemoglobin of 6 around 3 weeks ago No GI work-up was done this admission High risk of stroke from underlying fib flutter, Was restarted on Eliquis 5 mg twice daily yesterday As outpatient was previously on warfarin, will transition to Eliquis She will need close monitoring of blood count as an outpatient We will arrange for hematocrit 1 week  A. fib/flutter.   Cardioversion 1 month ago did not hold Amiodarone warfarin held on admission for GI bleed -Started on Eliquis yesterday Plan for 3 to 4 weeks of Eliquis then reinitiate amiodarone 2 to 3 weeks then could schedule cardioversion   Daughter in the room, long discussion concerning discharge instructions Discussed plan of action for her atrial fibrillation/flutter CHF education continued, titration of  diuretics May need metolazone as outpatient for any weight gain Daughter going to participate in patient's care Greater than 50% was spent in counseling and coordination of care with patient Total encounter time 35 minutes or more      Signed, Ida Rogue, MD  02/08/2019, 2:42 PM

## 2019-02-08 NOTE — Plan of Care (Signed)
Discharge instructions provided to pt.  All questions addressed.  Understanding verified through teach back.  Awaiting transportation home via POV.  Problem: Education: Goal: Knowledge of General Education information will improve Description: Including pain rating scale, medication(s)/side effects and non-pharmacologic comfort measures Outcome: Completed/Met   Problem: Health Behavior/Discharge Planning: Goal: Ability to manage health-related needs will improve Outcome: Completed/Met   Problem: Clinical Measurements: Goal: Ability to maintain clinical measurements within normal limits will improve Outcome: Completed/Met Goal: Will remain free from infection Outcome: Completed/Met Goal: Diagnostic test results will improve Outcome: Completed/Met Goal: Respiratory complications will improve Outcome: Completed/Met Goal: Cardiovascular complication will be avoided Outcome: Completed/Met   Problem: Activity: Goal: Risk for activity intolerance will decrease Outcome: Completed/Met   Problem: Elimination: Goal: Will not experience complications related to bowel motility Outcome: Completed/Met   Problem: Pain Managment: Goal: General experience of comfort will improve Outcome: Completed/Met   Problem: Safety: Goal: Ability to remain free from injury will improve Outcome: Completed/Met   Problem: Skin Integrity: Goal: Risk for impaired skin integrity will decrease Outcome: Completed/Met   Problem: Acute Rehab OT Goals (only OT should resolve) Goal: Pt. Will Perform Lower Body Bathing Outcome: Completed/Met Goal: Pt. Will Perform Lower Body Dressing Outcome: Completed/Met Goal: Pt. Will Transfer To Toilet Outcome: Completed/Met Goal: OT Additional ADL Goal #1 Outcome: Completed/Met   Problem: Acute Rehab PT Goals(only PT should resolve) Goal: Pt Will Go Sit To Supine/Side Outcome: Completed/Met Goal: Pt Will Transfer Bed To Chair/Chair To Bed Outcome:  Completed/Met Goal: Pt Will Ambulate Outcome: Completed/Met   Problem: Increased Nutrient Needs (NI-5.1) Goal: Food and/or nutrient delivery Description: Individualized approach for food/nutrient provision. Outcome: Completed/Met   Problem: Education: Goal: Ability to demonstrate management of disease process will improve Outcome: Completed/Met Goal: Ability to verbalize understanding of medication therapies will improve Outcome: Completed/Met Goal: Individualized Educational Video(s) Outcome: Completed/Met   Problem: Activity: Goal: Capacity to carry out activities will improve Outcome: Completed/Met   Problem: Cardiac: Goal: Ability to achieve and maintain adequate cardiopulmonary perfusion will improve Outcome: Completed/Met   Problem: Acute Rehab PT Goals(only PT should resolve) Goal: Pt Will Transfer Bed To Chair/Chair To Bed Outcome: Completed/Met Goal: Pt Will Perform Standing Balance Or Pre-Gait Outcome: Completed/Met Goal: Pt Will Ambulate Outcome: Completed/Met

## 2019-02-08 NOTE — Discharge Instructions (Signed)
It was so nice to meet you during this hospitalization!  You came into the hospital with shortness of breath. You had built up a lot of fluid in your legs and in your lungs. You also had some bleeding from your intestines and you went in and out of a-fib.  I have made the following medication changes: 1. We decreased your losartan dose from 25mg  daily to 12.5mg  (1/2 tablet) daily 2. Continue taking torsemide 40mg  twice a day 3. STOP taking the coumadin- we have switched you to eliquis 5mg  twice a day 4. STOP taking the coreg- we have switched you to metoprolol 25mg  twice a day 5. We have started a medicine called digoxin to help with your a-fib- please take digoxin 0.0625mg  daily 6. Take protonix 40mg  daily- this is an acid-reducing medicine to help make sure your stomach doesn't bleed again  Take care, Dr. Brett Albino

## 2019-02-08 NOTE — Consult Note (Signed)
PHARMACY CONSULT NOTE  Pharmacy Consult for Electrolyte Monitoring and Replacement   Recent Labs: Potassium (mmol/L)  Date Value  02/08/2019 4.3  07/10/2013 3.6   Magnesium (mg/dL)  Date Value  02/07/2019 2.3  07/10/2013 1.5 (L)   Calcium (mg/dL)  Date Value  02/08/2019 8.9   Calcium, Total (mg/dL)  Date Value  07/10/2013 8.1 (L)   Albumin (g/dL)  Date Value  02/03/2019 3.3 (L)  06/15/2017 3.5 (L)  07/08/2013 1.7 (L)   Phosphorus (mg/dL)  Date Value  02/04/2019 3.5   Sodium (mmol/L)  Date Value  02/08/2019 137  12/24/2018 135  07/10/2013 134 (L)   Corrected Ca: 9.4 mg/dL  Assessment: 68 y.o. female with medical problems of chronic systolic congestive heart failure, coronary disease, atrial fibrillation requiring anticoagulation, diabetes, anemia, carotid artery disease with history of left carotid endarterectomy, previous stroke, hypertension, chronic kidney disease, was admitted on10/9/2020with shortness of breath worsening edema, acute kidney injury.   This patient is currently receiving the following: Torsemide 40mg  po daily, digoxin 0.0625 mg daily  Goals of Therapy:  Given cardiac history: Potassium 4.0 - 5.1 mmol/L Magnesium: 2.0 - 2.4 mg/dL Other electrolytes WNL  Plan:  K 4.3 Scr 1.98  No additional electrolyte supplementation warranted at this time  Recheck electrolytes with AM labs.  Velita Quirk A, PharmD 02/08/2019 10:20 AM

## 2019-02-10 ENCOUNTER — Telehealth: Payer: Self-pay

## 2019-02-10 ENCOUNTER — Other Ambulatory Visit: Payer: Self-pay

## 2019-02-10 ENCOUNTER — Ambulatory Visit: Payer: Medicare Other | Admitting: Family

## 2019-02-10 DIAGNOSIS — I5023 Acute on chronic systolic (congestive) heart failure: Secondary | ICD-10-CM

## 2019-02-10 NOTE — Telephone Encounter (Signed)
Attempted to reach patient for TCM call and schedule hospital follow up appt. Unable to leave message on home # and mobile not in service. Patient may reach me directly at [phone] or contact the office.

## 2019-02-11 ENCOUNTER — Telehealth: Payer: Self-pay

## 2019-02-11 ENCOUNTER — Other Ambulatory Visit: Payer: Self-pay

## 2019-02-11 NOTE — Patient Outreach (Signed)
New referral:  Discharged on 02/08/2019 Diagnosis: Afib Primary MD office does transition of care.  Placed call to patient. Message on phone reports phone calls are not allowed.   PLAN: will Corporate treasurer. Noted in chart others have not been able to reach patient as well. Will call back in 3 days.  Tomasa Rand, RN, BSN, CEN [facility] [employer] [phone]

## 2019-02-11 NOTE — Telephone Encounter (Signed)
-----   Message from Minna Merritts, MD sent at 02/09/2019  1:44 PM EST ----- Needs TCM phone call CHF and clinic follow-up needed Thx TG

## 2019-02-11 NOTE — Telephone Encounter (Signed)
Attempted to call to schedule TCM appt, no answer, no VM

## 2019-02-13 LAB — COOXEMETRY PANEL
Carboxyhemoglobin: 1.7 % — ABNORMAL HIGH (ref 0.5–1.5)
Methemoglobin: 0.3 % (ref 0.0–1.5)
O2 Saturation: 96.6 %

## 2019-02-13 NOTE — Telephone Encounter (Signed)
Attempted to returned the patient's call x2. Message sts that the "call cannot be completed at this time, pleas try the call again later."

## 2019-02-13 NOTE — Telephone Encounter (Signed)
Patient calling to schedule follow up with Dr Fletcher Anon  Informed patient that Dr Fletcher Anon does not have any appointments available soon Patient declined virtual visit and declined visit with Angelica Ran or R Dunn Requested to speak with a nurse Please call to discuss

## 2019-02-14 ENCOUNTER — Other Ambulatory Visit: Payer: Self-pay

## 2019-02-14 NOTE — Telephone Encounter (Signed)
3rd attempt to contact the patient. Unable to lmom on [phone]. Phone rings out then gives a busy signal. The other telephone number listed for the patient is disconnected.

## 2019-02-14 NOTE — Patient Outreach (Signed)
Telephone assessment:  Placed outreach call to patient. Message on phone states that VERIZON is unable to complete call.  PLAN: will attempt again in 3 business days. Letter already mailed.  Tomasa Rand, RN, BSN, CEN [facility] [employer] [phone]

## 2019-02-18 ENCOUNTER — Encounter: Payer: Self-pay | Admitting: Cardiovascular Disease

## 2019-02-18 NOTE — Progress Notes (Deleted)
   Patient ID: Meghan [name], female    DOB: 1950/04/14, 68 y.o.   MRN: [id number]  HPI  Meghan [name] is a 68 y/o female with a history of  Echo report from 12/14/2018 reviewed and showed an EF of 25-30% along with moderate TR and moderately elevated PA pressure of 56.2 mmHg.   Catheterization done 03/12/17 which showed:  Ost LAD to Prox LAD lesion is 99% stenosed.  Prox LAD to Mid LAD lesion is 99% stenosed.  Ost Ramus lesion is 20% stenosed.  Mid Cx lesion is 40% stenosed.  Prox RCA to Mid RCA lesion is 20% stenosed.  Mid RCA to Dist RCA lesion is 40% stenosed.   1.  Significant severe one-vessel coronary artery disease with subtotal occlusion of the LAD at the ostium and mid segment with faint left to left collaterals.  Mild to moderate left circumflex and RCA disease. 2.  Left ventricular angiography was not performed.  Aortic valve could not be crossed due to severe tortuosity of the innominate artery 3.  Right heart catheterization showed moderately elevated filling pressures with a wedge pressure of 23 mmHg, moderate pulmonary hypertension with a pressure of 48 of 19 mmHg and mild reduced cardiac output at 4.71 with a cardiac index of 2.25.   Admitted 01/17/2019 due to acute on chronic HF. Cardiology, GI and nephrology consults obtained. Initially needed milrinone as well as lasix drips. Was then transitioned to oral diuretics. Given transfusions and IV iron due to anemia. Discharged after 22 days.  Review of Systems    Physical Exam  Assessment & Plan:  1: Chronic heart failure with reduced ejection fraction- - NYHA class - BNP 02/01/2019 was 563.0  2: Atrial fibrillation- - Started on digoxin and metoprolol during most recent hospitalization - plan for cardioversion in the future  3: HTN- - BP - BMP 02/08/2019 reviewed and showed sodium 137, potassium 4.3, creatinine 1.98 and GFR 25  4: DM- - A1c 12/14/2018 was 7.4%

## 2019-02-18 NOTE — Telephone Encounter (Signed)
Printed and placed in out-going mail.

## 2019-02-19 ENCOUNTER — Other Ambulatory Visit: Payer: Self-pay

## 2019-02-19 ENCOUNTER — Telehealth: Payer: Self-pay | Admitting: Family

## 2019-02-19 ENCOUNTER — Ambulatory Visit: Payer: Medicare Other | Admitting: Family

## 2019-02-19 NOTE — Patient Outreach (Signed)
Telephone assessment:  Attempt to reach patient without success.  This was the 3rd attempt.  PLAN: letter already mailed.  Will close case on 02/25/2019 if no response.  Tomasa Rand, RN, BSN, CEN [facility] [employer] [phone]

## 2019-02-19 NOTE — Telephone Encounter (Signed)
Patient did not show for her Heart Failure Clinic appointment on 02/19/2019. Will attempt to reschedule.

## 2019-02-20 ENCOUNTER — Telehealth: Payer: Self-pay | Admitting: Nurse Practitioner

## 2019-02-20 ENCOUNTER — Ambulatory Visit (INDEPENDENT_AMBULATORY_CARE_PROVIDER_SITE_OTHER): Payer: Medicare Other | Admitting: Nurse Practitioner

## 2019-02-20 ENCOUNTER — Other Ambulatory Visit
Admission: RE | Admit: 2019-02-20 | Discharge: 2019-02-20 | Disposition: A | Discharge status: Home / Self Care | Payer: Medicare Other | Source: Ambulatory Visit | Attending: Nurse Practitioner | Admitting: Nurse Practitioner

## 2019-02-20 ENCOUNTER — Other Ambulatory Visit: Payer: Self-pay

## 2019-02-20 ENCOUNTER — Encounter: Payer: Self-pay | Admitting: Nurse Practitioner

## 2019-02-20 VITALS — BP 100/60 | HR 66 | Ht 67.0 in | Wt 260.0 lb

## 2019-02-20 DIAGNOSIS — I5020 Unspecified systolic (congestive) heart failure: Secondary | ICD-10-CM | POA: Diagnosis not present

## 2019-02-20 DIAGNOSIS — I4819 Other persistent atrial fibrillation: Secondary | ICD-10-CM

## 2019-02-20 DIAGNOSIS — N1832 Chronic kidney disease, stage 3b: Secondary | ICD-10-CM

## 2019-02-20 DIAGNOSIS — I255 Ischemic cardiomyopathy: Secondary | ICD-10-CM | POA: Diagnosis not present

## 2019-02-20 DIAGNOSIS — D509 Iron deficiency anemia, unspecified: Secondary | ICD-10-CM

## 2019-02-20 DIAGNOSIS — I5022 Chronic systolic (congestive) heart failure: Secondary | ICD-10-CM

## 2019-02-20 DIAGNOSIS — K922 Gastrointestinal hemorrhage, unspecified: Secondary | ICD-10-CM

## 2019-02-20 LAB — COMPREHENSIVE METABOLIC PANEL
ALT: 20 U/L (ref 0–44)
AST: 27 U/L (ref 15–41)
Albumin: 3.1 g/dL — ABNORMAL LOW (ref 3.5–5.0)
Alkaline Phosphatase: 132 U/L — ABNORMAL HIGH (ref 38–126)
Anion gap: 11 (ref 5–15)
BUN: 72 mg/dL — ABNORMAL HIGH (ref 8–23)
CO2: 25 mmol/L (ref 22–32)
Calcium: 8.4 mg/dL — ABNORMAL LOW (ref 8.9–10.3)
Chloride: 92 mmol/L — ABNORMAL LOW (ref 98–111)
Creatinine, Ser: 2.69 mg/dL — ABNORMAL HIGH (ref 0.44–1.00)
GFR calc Af Amer: 20 mL/min — ABNORMAL LOW (ref >60)
GFR calc non Af Amer: 17 mL/min — ABNORMAL LOW (ref >60)
Glucose, Bld: 155 mg/dL — ABNORMAL HIGH (ref 70–99)
Potassium: 4.3 mmol/L (ref 3.5–5.1)
Sodium: 128 mmol/L — ABNORMAL LOW (ref 135–145)
Total Bilirubin: 3.1 mg/dL — ABNORMAL HIGH (ref 0.3–1.2)
Total Protein: 8.8 g/dL — ABNORMAL HIGH (ref 6.5–8.1)

## 2019-02-20 LAB — CBC
HCT: 30.7 % — ABNORMAL LOW (ref 36.0–46.0)
Hemoglobin: 9.7 g/dL — ABNORMAL LOW (ref 12.0–15.0)
MCH: 26 pg (ref 26.0–34.0)
MCHC: 31.6 g/dL (ref 30.0–36.0)
MCV: 82.3 fL (ref 80.0–100.0)
Platelets: 223 10*3/uL (ref 150–400)
RBC: 3.73 MIL/uL — ABNORMAL LOW (ref 3.87–5.11)
RDW: 20.9 % — ABNORMAL HIGH (ref 11.5–15.5)
WBC: 6 10*3/uL (ref 4.0–10.5)
nRBC: 0 % (ref 0.0–0.2)

## 2019-02-20 LAB — DIGOXIN LEVEL: Digoxin Level: 1 ng/mL (ref 0.8–2.0)

## 2019-02-20 MED ORDER — METOPROLOL TARTRATE 25 MG PO TABS: 25.0000 mg | ORAL_TABLET | Freq: Two times a day (BID) | ORAL | 6 refills | Status: AC

## 2019-02-20 MED ORDER — APIXABAN 5 MG PO TABS: 5.0000 mg | ORAL_TABLET | Freq: Two times a day (BID) | ORAL | 6 refills | Status: AC

## 2019-02-20 MED ORDER — LOSARTAN POTASSIUM 25 MG PO TABS: 12.5000 mg | ORAL_TABLET | Freq: Every day | ORAL | 6 refills | Status: AC

## 2019-02-20 MED ORDER — ONDANSETRON HCL 4 MG PO TABS: 4.0000 mg | ORAL_TABLET | Freq: Three times a day (TID) | ORAL | 2 refills | Status: AC | PRN

## 2019-02-20 MED ORDER — DIGOXIN 62.5 MCG PO TABS: 0.0625 mg | ORAL_TABLET | Freq: Every day | ORAL | 6 refills | Status: DC

## 2019-02-20 MED ORDER — ATORVASTATIN CALCIUM 40 MG PO TABS: 40.0000 mg | ORAL_TABLET | Freq: Every day | ORAL | 2 refills | Status: AC

## 2019-02-20 NOTE — Telephone Encounter (Signed)
Notes recorded by Theora Gianotti, NP on 02/20/2019 at 10:32 AM EST  Her digoxin level is within a normal range, but I remain concerned that it might be playing a role in her nausea. Please have her hold.   Kidney's appear dry. Please reduce torsemide to 20mg  daily.  Total bilirubin is elevated w/ normal liver enzymes. ? Biliary tract disease that may be contributing to nausea. F/u w/ GI as previously recommended - sooner rather than later.  F/u cmet in a week. Cont to weigh daily and watch sodium closely as we are reducing torsemide dose and stopping digoxin.

## 2019-02-20 NOTE — Patient Instructions (Signed)
Medication Instructions:  1- TAKE Zofran as needed Take 1 tablet (4 mg total) by mouth every 8 (eight) hours as needed for nausea or vomiting. *If you need a refill on your cardiac medications before your next appointment, please call your pharmacy*  Lab Work: Your physician recommends that you return for lab work today at the medical mall. CBC, CMET, Digoxin level No appt is needed. Hours are M-F 7AM- 6 PM.  If you have labs (blood work) drawn today and your tests are completely normal, you will receive your results only by: Marland Kitchen MyChart Message (if you have MyChart) OR . A paper copy in the mail If you have any lab test that is abnormal or we need to change your treatment, we will call you to review the results.  Testing/Procedures: None ordered   Follow-Up: At [facility], you and your health needs are our priority.  As part of our continuing mission to provide you with exceptional heart care, we have created designated Provider Care Teams.  These Care Teams include your primary Cardiologist (physician) and Advanced Practice Providers (APPs -  Physician Assistants and Nurse Practitioners) who all work together to provide you with the care you need, when you need it.  Your next appointment:   2 weeks  The format for your next appointment:   In Person  Provider:    You may see Kathlyn Sacramento, MD or Murray Hodgkins, NP  Other Instructions Contact GI for follow up

## 2019-02-20 NOTE — Telephone Encounter (Signed)
Attempted to call the patient. No answer/ no voice mail at her home #. I left a message to please call back on her cell #.

## 2019-02-20 NOTE — Progress Notes (Addendum)
Office Visit    Patient Name: Meghan [name] Date of Encounter: 02/20/2019  Primary Care Provider:  [name], MD Primary Cardiologist:  Kathlyn Sacramento, MD  Chief Complaint    68 y/o ? with a history of ischemic cardiomyopathy/HFrEF (EF 25-30% 12/2018), coronary artery disease with chronic subtotal occlusion of the LAD on catheterization in December 2018, hypertension, hyperlipidemia, persistent atrial fibrillation status post cardioversion September 2020 with ERAF and subsequent initiation of amio, diabetes, stroke, carotid arterial disease status post left carotid endarterectomy in November 2013, stage III chronic kidney disease, and recent admission for GI bleed/anemia, who presents for follow-up of heart failure.  Past Medical History    Past Medical History:  Diagnosis Date   Carotid arterial disease ([place])    a. 02/2012 s/p L CEA.   CKD stage G3b/A1, GFR 30-44 and albumin creatinine ratio <30 mg/g    Coronary artery disease    a. 02/2012 Lexi MV: Anterior, anteroseptal, septal, apical infarct with mild peri-infarct ischemia, EF 41%- medically managed due to anemia; b. 03/2017 Cath: LM nl, LAD 99ost/p/m - faint L->L collaterals, RI 20ost, LCX 71m, RCA 20p, 75m-->Med Rx for chronic subtotal LAD dzs. Avoid R Radial access in future.   Diabetes mellitus without complication ([place])    Type II   HFrEF (heart failure with reduced ejection fraction) ([place])    a. 06/2013 Echo: EF 35-40%; b. 07/2016 Echo: EF 25-30%; c. 11/2016 Echo: EF 20-25%; d. 12/2018 Echo: EF 25-30%, diff HK; e. 12/2018 Echo: EF 25-30%.   Hyperlipidemia    Hypertension    Ischemic cardiomyopathy    a. 06/2013 Echo: EF 35-40%, glob HK; b. 07/2016 Echo: EF 25-30%, sev mid-apicalanteroseptal, ant, apical HK; c. 11/2016 Echo: EF 20-25%, antsept DK, Gr2 DD; d. 12/2018 Echo: EF 25-30%, diff HK.   Morbid obesity ([place])    Normocytic anemia    Persistent atrial fibrillation ([place])    a. CHA2DS2VASc = 7-->eliquis    Stroke [facility]) 2013   Past Surgical History:  Procedure Laterality Date   AMPUTATION TOE Left 09/22/2016   Procedure: AMPUTATION TOE;  Surgeon: Albertine Patricia, DPM;  Location: [facility] ORS;  Service: Podiatry;  Laterality: Left;   CARDIOVERSION N/A 07/28/2016   Procedure: CARDIOVERSION;  Surgeon: Wellington Hampshire, MD;  Location: [facility] ORS;  Service: Cardiovascular;  Laterality: N/A;   CARDIOVERSION N/A 08/07/2016   Procedure: CARDIOVERSION;  Surgeon: Wellington Hampshire, MD;  Location: [facility] ORS;  Service: Cardiovascular;  Laterality: N/A;   CARDIOVERSION N/A 01/03/2019   Procedure: CARDIOVERSION;  Surgeon: Minna Merritts, MD;  Location: [facility] ORS;  Service: Cardiovascular;  Laterality: N/A;   CAROTID ENDARTERECTOMY     [facility]; Dr. Lucky Cowboy   CHOLECYSTECTOMY     ESOPHAGOGASTRODUODENOSCOPY (EGD) WITH PROPOFOL N/A 01/25/2019   Procedure: ESOPHAGOGASTRODUODENOSCOPY (EGD) WITH PROPOFOL;  Surgeon: Virgel Manifold, MD;  Location: [facility] ENDOSCOPY;  Service: Endoscopy;  Laterality: N/A;   foot infection Right    LEG SURGERY     right;infection   RIGHT/LEFT HEART CATH AND CORONARY ANGIOGRAPHY Bilateral 03/12/2017   Procedure: RIGHT/LEFT HEART CATH AND CORONARY ANGIOGRAPHY;  Surgeon: Wellington Hampshire, MD;  Location: [place] CV LAB;  Service: Cardiovascular;  Laterality: Bilateral;   TONSILLECTOMY      Allergies  Allergies  Allergen Reactions   Spironolactone     Hyperkalemia     History of Present Illness    68 y/o ? with a history of ischemic cardiomyopathy/HFrEF (EF 25-30% 12/2018), coronary artery disease with chronic subtotal  occlusion of the LAD on catheterization in December 2018, hypertension, hyperlipidemia, persistent atrial fibrillation status post cardioversion September 2020 with ERAF and subsequent initiation of amio, diabetes, stroke, carotid arterial disease status post left carotid endarterectomy in November 2013, stage III chronic kidney disease, and recent admission for  GI bleed/anemia.  Atrial fibrillation was diagnosed in April 2018 in the setting of LV dysfunction with an EF of 25 to 30%.  She had persistent LV dysfunction despite restoration of sinus rhythm with an EF of 20 to 25% by echo in August 2018.  She underwent diagnostic catheterization December 2018 which reveals a chronic subtotal occlusion of the LAD with faint left to left collaterals and otherwise moderate, diffuse nonobstructive CAD.  This has been medically managed since.  In September 2020, she was admitted with altered mental status and hypoglycemia.  She was noted to be in atrial fibrillation with rapid ventricular response.  Echo showed an EF of 30 to 35% with moderate pulmonary hypertension and mildly reduced RV systolic function.  The left atrium was severely dilated.  She required IV diuresis which resulted in worsening renal function and holding of Entresto.    She subsequently underwent cardioversion on September 25 which was briefly successful though at follow-up visit on October 1, she was noted to be back in atrial fibrillation and also with worsening lower extremity edema and dyspnea.  Diuretic dosing was adjusted however, she continued to have worsening lower extremity edema, abdominal bloating and dyspnea prompting presentation to the emergency department in early October.  She was found to be anemic with a hemoglobin of 6.7.  Creatinine was 3.8 (baseline 1.7).  She remained in atrial fibrillation.  Warfarin was held.  With diuresis, she initially had improvement in creatinine however subsequently developed cardiorenal syndrome and was transferred to the ICU on October 20 for initiation of milrinone.  In the setting of holding anticoagulation, amiodarone was discontinued.  She remained on milrinone for 9 days with steady improvement in volume status and creatinine.  Following discontinuation of milrinone, she was transitioned to oral torsemide-40 mg daily, losartan, and low-dose digoxin.  H&H  remained stable following multiple units of blood.  An EGD was attempted October 17 however, she became hypoxic and it was canceled.  She was placed on PPI therapy.  Eliquis was started prior to discharge with a plan for resumption of amiodarone approximately 3 weeks of consistent anticoagulation +/- DCCV.  She was discharged home on October 31.  Since d/c, her wt has been stable.  She has not noted any significant dyspnea though activity has been limited.  No edema or abdominal bloating.  She continues to feel somewhat weak and notes that since discharge, she has been nauseated and unable to keep food down.  She has not had any melena but has noted occasional bright red blood on toilet paper.  She denies chest pain, palpitations, PND, orthopnea, dizziness, syncope, or early satiety.  She has not had any lab work since her discharge on October 31.  Home Medications    Prior to Admission medications   Medication Sig Start Date End Date Taking? Authorizing Provider  apixaban (ELIQUIS) 5 MG TABS tablet Take 1 tablet (5 mg total) by mouth 2 (two) times daily. 02/08/19   Mayo, Pete Pelt, MD  atorvastatin (LIPITOR) 40 MG tablet Take 1 tablet by mouth once daily Patient taking differently: Take 40 mg by mouth daily.  11/25/18   Wellington Hampshire, MD  digoxin 62.5 MCG TABS Take 0.0625  mg by mouth daily. 02/09/19   Mayo, Pete Pelt, MD  glipiZIDE (GLUCOTROL) 10 MG tablet Take 10 mg by mouth 2 (two) times daily. Dr Honor Junes 08/13/15   [provider]  Insulin Degludec 200 UNIT/ML SOPN Inject 25 Units into the skin daily at 12 noon. Dr Honor Junes ([id number]) 02/18/18   [provider]  levothyroxine (SYNTHROID) 50 MCG tablet Take 50 mcg by mouth daily before breakfast.    [provider]  losartan (COZAAR) 25 MG tablet Take 0.5 tablets (12.5 mg total) by mouth daily. 02/09/19   Mayo, Pete Pelt, MD  metoprolol tartrate (LOPRESSOR) 25 MG tablet Take 1 tablet (25 mg total) by mouth 2 (two) times  daily. 02/08/19   Mayo, Pete Pelt, MD  pantoprazole (PROTONIX) 40 MG tablet Take 1 tablet (40 mg total) by mouth daily. 02/08/19   Mayo, Pete Pelt, MD  torsemide (DEMADEX) 20 MG tablet Take 2 tablets (40 mg total) by mouth 2 (two) times daily. 01/09/19   Wellington Hampshire, MD  triamcinolone cream (KENALOG) 0.5 % Apply 1 application topically 2 (two) times daily as needed (psoriasis).     [provider]    Review of Systems    Fatigue and nausea since discharge October 31.  She denies chest pain, dyspnea, palpitations, PND, orthopnea, dizziness, syncope, edema, or early satiety.  All other systems reviewed and are otherwise negative except as noted above.  Physical Exam    VS:  BP 100/60 (BP Location: Left Arm, Patient Position: Sitting, Cuff Size: Normal)    Pulse 66    Ht 5\' 7"  (1.702 m)    Wt 260 lb (117.9 kg) Comment: Estimated by patient-unable to get on scale   SpO2 92%    BMI 40.72 kg/m  , BMI Body mass index is 40.72 kg/m. GEN: Obese, in no acute distress. HEENT: normal. Neck: Supple, obese, difficult to gauge JVP secondary to girth.  No carotid bruits, or masses. Cardiac: Irregularly irregular, no murmurs, rubs, or gallops. No clubbing, cyanosis, edema.  Radials/PT 2+ and equal bilaterally.  Respiratory:  Respirations regular and unlabored, bibasilar crackles. GI: Soft, nontender, nondistended, BS + x 4. MS: no deformity or atrophy. Skin: warm and dry, no rash. Neuro:  Strength and sensation are intact. Psych: Normal affect.  Accessory Clinical Findings    ECG personally reviewed by me today -atrial fibrillation, 66, septal infarct, nonspecific T changes- no acute changes.  Lab Results  Component Value Date   WBC 6.0 02/20/2019   HGB 9.7 (L) 02/20/2019   HCT 30.7 (L) 02/20/2019   MCV 82.3 02/20/2019   PLT 223 02/20/2019   Lab Results  Component Value Date   CREATININE 2.69 (H) 02/20/2019   BUN 72 (H) 02/20/2019   NA 128 (L) 02/20/2019   K 4.3 02/20/2019    CL 92 (L) 02/20/2019   CO2 25 02/20/2019   Lab Results  Component Value Date   ALT 20 02/20/2019   AST 27 02/20/2019   ALKPHOS 132 (H) 02/20/2019   BILITOT 3.1 (H) 02/20/2019   Lab Results  Component Value Date   CHOL 67 (L) 12/27/2016   HDL 23 (L) 12/27/2016   LDLCALC 35 12/27/2016   TRIG 45 12/27/2016   CHOLHDL 2.9 12/27/2016     Assessment & Plan    1.  HFrEF/ischemic cardiomyopathy: Patient recently with prolonged admission for 3 weeks in the setting of worsening heart failure and persistent A. fib with tachycardia in the setting of cardiorenal syndrome and anemia/GI  bleeding requiring transfusions.  She required milrinone infusion for 9 days towards the end of her admission and was finally discharged on October 31 on digoxin, beta-blocker, ARB, and torsemide.  She has not had any significant dyspnea since discharge and her weight has been stable.  She has bibasilar crackles on examination today but otherwise appears euvolemic.  I obtained lab work today which shows a rise in BUN and creatinine to 72 and 2.69, up from 67 and 1.98 on October 31.  Digoxin level is 1.0.  We have contacted with lab results and I have asked that she hold digoxin as I am concerned it may be contributing to nausea.  I have also reduced her torsemide to 20 mg daily.  Plan to follow-up a basic metabolic panel in a week with office follow-up in 2 weeks.  2.  Persistent atrial fibrillation/flutter: Currently well rate controlled at 66 bpm.  As above, holding digoxin in the setting of nausea with a level of 1.0.  Eliquis was resumed at discharge and H&H are stable today.  At discharge, it was discussed that after 3 to 4 weeks of anticoagulation, we might consider resumption of amiodarone.  We can reconsider this at follow-up appointment in 2 weeks though if nausea is ongoing, I would be somewhat reluctant to add this on top of it.  3.  Nausea: Present since discharge from the hospital.  As above, holding digoxin.  I  do note that her total bilirubin is elevated at 3.1 with very mild elevation of alkaline phosphatase and normal AST and ALT.  She was seen by GI during hospitalization in the setting of GI bleeding with plan for EGD, which was canceled secondary to hypoxia.  I have given her a prescription for as needed Zofran and have recommended follow-up with GI in the setting of abnormal lab findings with concern for possible biliary tract disease.  4.  GI bleed/anemia: Status post recent admission requiring transfusion.  Eliquis was resumed at the time of discharge and H&H is 9.7 and 30.7 today.  She remains on PPI therapy, which may also be contributing to nausea, but with recent GI bleed is too important to hold.  5.  Stage III chronic kidney disease/cardiorenal syndrome: BUN and creatinine higher today in the setting of poor p.o. intake and nausea along with torsemide and losartan therapy.  She is euvolemic on examination I am going to cut her torsemide back to 20 mg daily.  Follow-up basic metabolic panel next week.  6.  Essential hypertension: Blood pressure stable to soft today.  Reducing torsemide dose.  7.  Hyperlipidemia: On statin therapy.  8.  DMII: A1c 7.4 in Sept.  On insulin and followed by primary care.  9.  Disposition: Follow-up complete metabolic panel in 1 week.  GI referral made.  Follow-up in clinic in 2 weeks.   Murray Hodgkins, NP 02/20/2019, 12:12 PM

## 2019-02-21 ENCOUNTER — Telehealth: Payer: Self-pay | Admitting: Cardiovascular Disease

## 2019-02-21 ENCOUNTER — Other Ambulatory Visit: Payer: Self-pay

## 2019-02-21 DIAGNOSIS — I5022 Chronic systolic (congestive) heart failure: Secondary | ICD-10-CM

## 2019-02-21 DIAGNOSIS — N1832 Chronic kidney disease, stage 3b: Secondary | ICD-10-CM

## 2019-02-21 NOTE — Telephone Encounter (Signed)
DPR on file listing both daughter's [name] and Tammie.  The telephone number listed for Tammie connects to [facility].  LMTCB on the telephone number listed for Karrie.

## 2019-02-21 NOTE — Telephone Encounter (Signed)
Patient is returning your call. States it is ok to talk with her daughter

## 2019-02-21 NOTE — Telephone Encounter (Signed)
   I called and talked to patient's [name] to go over rationale for recommendations.  I did apologize with regards to torsemide recommendations as it was my understanding that she was taking 40 mg daily, not twice daily.  My goal was to half her dose of daily torsemide.  As she is taking it twice daily, I have advised that she take 20 mg twice daily, not 20 mg daily.  She was grateful to hear this.  She did ask if she could continue digoxin for her mother and I explained that in the setting of worsening kidney function and a rising digoxin level (1.0 yesterday), along with malaise, fatigue, and complaints of nausea since initiating digoxin, but I felt it was most prudent to discontinue digoxin at this time as she may be developing toxicity.  I advised that if we could stabilize her renal function that perhaps we could consider resumption of digoxin at some point, as it does appear to be playing a role in her rate control.  Her daughter is concerned about monitoring her at home but they do have a home scale, which she uses daily.  Her weight was 259 pounds today-1 pound down from her weight in our office yesterday.  She also has a blood pressure cuff which reports a pulse as well.  I encouraged her to weigh her mother daily and call us for gains of 2 pounds in a day or 5 pounds over the course of a week, at which point we would likely have her take an additional torsemide.  I also advised that she can monitor her mother's heart rate via the blood pressure cuff and that despite discontinuing digoxin, she is still on metoprolol which will help with rate control.  Caller verbalized understanding and was grateful for the call back.  Meghan [name] will have a follow-up basic metabolic panel next week and I will plan to see her back in 2 weeks.  Murray Hodgkins, NP 02/21/2019, 12:15 PM

## 2019-02-21 NOTE — Telephone Encounter (Addendum)
Spoke with the patient's daughter [name] and made her aware of the patient's results and Ignacia Bayley, NP recommendation.  [name] sts that she disagrees with the medication changes recommended. Attempted to explain the rationale for the medication changes. [name] was not satisfied with the explanation and adamant that she did not agree with the changes. Dorothea Ogle that I will fwd her concerns to [place].  Spoke with Gerald Stabs and he will call [name] directly to discuss.

## 2019-02-21 NOTE — Telephone Encounter (Signed)
Returned the call to [employer]. Advised them not to refill Digoxin. It was d/c by Ignacia Bayley, NP on 02/20/19.  Patient's medication list updated.

## 2019-02-21 NOTE — Telephone Encounter (Signed)
Pt c/o medication issue:  1. Name of Medication: Digoxin   2. How are you currently taking this medication (dosage and times per day)? 0.0625 mg po q d   3. Are you having a reaction (difficulty breathing--STAT)? no  4. What is your medication issue? Pharmacy is out of this dose can they fil it as 0.125 mg 1/2 tab q d

## 2019-02-25 ENCOUNTER — Other Ambulatory Visit: Payer: Self-pay

## 2019-02-25 NOTE — Patient Outreach (Signed)
Case closure:  Unable to reach patient via phone or mail  No response.  PLAN: Close case as unable to reach. Will send MD letter.  Tomasa Rand, RN, BSN, CEN [facility] [employer] [phone]

## 2019-02-25 NOTE — Addendum Note (Signed)
Addended by: Lamar Laundry on: 02/25/2019 02:36 PM   Modules accepted: Orders

## 2019-02-28 ENCOUNTER — Other Ambulatory Visit
Admission: RE | Admit: 2019-02-28 | Discharge: 2019-02-28 | Disposition: A | Discharge status: Home / Self Care | Payer: Medicare Other | Source: Ambulatory Visit | Attending: Nurse Practitioner | Admitting: Nurse Practitioner

## 2019-02-28 ENCOUNTER — Encounter: Payer: Self-pay | Admitting: Intensive Care

## 2019-02-28 ENCOUNTER — Telehealth: Payer: Self-pay | Admitting: Cardiovascular Disease

## 2019-02-28 ENCOUNTER — Inpatient Hospital Stay
Admission: EM | Admit: 2019-02-28 | Discharge: 2019-03-04 | DRG: 682 | Disposition: A | Discharge status: Home Health | Payer: Medicare Other | Attending: Internal Medicine | Admitting: Internal Medicine

## 2019-02-28 ENCOUNTER — Other Ambulatory Visit: Payer: Self-pay

## 2019-02-28 ENCOUNTER — Emergency Department: Payer: Medicare Other

## 2019-02-28 DIAGNOSIS — Z794 Long term (current) use of insulin: Secondary | ICD-10-CM | POA: Diagnosis not present

## 2019-02-28 DIAGNOSIS — R627 Adult failure to thrive: Secondary | ICD-10-CM | POA: Diagnosis present

## 2019-02-28 DIAGNOSIS — D638 Anemia in other chronic diseases classified elsewhere: Secondary | ICD-10-CM | POA: Diagnosis present

## 2019-02-28 DIAGNOSIS — Z9049 Acquired absence of other specified parts of digestive tract: Secondary | ICD-10-CM | POA: Diagnosis not present

## 2019-02-28 DIAGNOSIS — I5043 Acute on chronic combined systolic (congestive) and diastolic (congestive) heart failure: Secondary | ICD-10-CM | POA: Diagnosis present

## 2019-02-28 DIAGNOSIS — R0602 Shortness of breath: Secondary | ICD-10-CM | POA: Diagnosis not present

## 2019-02-28 DIAGNOSIS — E039 Hypothyroidism, unspecified: Secondary | ICD-10-CM | POA: Diagnosis present

## 2019-02-28 DIAGNOSIS — N179 Acute kidney failure, unspecified: Secondary | ICD-10-CM | POA: Diagnosis not present

## 2019-02-28 DIAGNOSIS — Z89422 Acquired absence of other left toe(s): Secondary | ICD-10-CM

## 2019-02-28 DIAGNOSIS — Z66 Do not resuscitate: Secondary | ICD-10-CM | POA: Diagnosis not present

## 2019-02-28 DIAGNOSIS — Z7401 Bed confinement status: Secondary | ICD-10-CM | POA: Diagnosis not present

## 2019-02-28 DIAGNOSIS — Z7189 Other specified counseling: Secondary | ICD-10-CM | POA: Diagnosis not present

## 2019-02-28 DIAGNOSIS — N184 Chronic kidney disease, stage 4 (severe): Secondary | ICD-10-CM | POA: Diagnosis not present

## 2019-02-28 DIAGNOSIS — R112 Nausea with vomiting, unspecified: Secondary | ICD-10-CM | POA: Diagnosis present

## 2019-02-28 DIAGNOSIS — R531 Weakness: Secondary | ICD-10-CM | POA: Diagnosis not present

## 2019-02-28 DIAGNOSIS — E871 Hypo-osmolality and hyponatremia: Secondary | ICD-10-CM | POA: Diagnosis present

## 2019-02-28 DIAGNOSIS — M2550 Pain in unspecified joint: Secondary | ICD-10-CM | POA: Diagnosis not present

## 2019-02-28 DIAGNOSIS — N186 End stage renal disease: Secondary | ICD-10-CM | POA: Diagnosis not present

## 2019-02-28 DIAGNOSIS — I5022 Chronic systolic (congestive) heart failure: Secondary | ICD-10-CM | POA: Diagnosis not present

## 2019-02-28 DIAGNOSIS — I4891 Unspecified atrial fibrillation: Secondary | ICD-10-CM | POA: Diagnosis not present

## 2019-02-28 DIAGNOSIS — Z20828 Contact with and (suspected) exposure to other viral communicable diseases: Secondary | ICD-10-CM | POA: Diagnosis present

## 2019-02-28 DIAGNOSIS — I130 Hypertensive heart and chronic kidney disease with heart failure and stage 1 through stage 4 chronic kidney disease, or unspecified chronic kidney disease: Secondary | ICD-10-CM | POA: Diagnosis present

## 2019-02-28 DIAGNOSIS — I5023 Acute on chronic systolic (congestive) heart failure: Secondary | ICD-10-CM | POA: Diagnosis not present

## 2019-02-28 DIAGNOSIS — N189 Chronic kidney disease, unspecified: Secondary | ICD-10-CM | POA: Diagnosis not present

## 2019-02-28 DIAGNOSIS — I10 Essential (primary) hypertension: Secondary | ICD-10-CM | POA: Diagnosis not present

## 2019-02-28 DIAGNOSIS — I482 Chronic atrial fibrillation, unspecified: Secondary | ICD-10-CM | POA: Diagnosis not present

## 2019-02-28 DIAGNOSIS — Z515 Encounter for palliative care: Secondary | ICD-10-CM | POA: Diagnosis not present

## 2019-02-28 DIAGNOSIS — E1142 Type 2 diabetes mellitus with diabetic polyneuropathy: Secondary | ICD-10-CM | POA: Diagnosis present

## 2019-02-28 DIAGNOSIS — I255 Ischemic cardiomyopathy: Secondary | ICD-10-CM | POA: Diagnosis present

## 2019-02-28 DIAGNOSIS — Z833 Family history of diabetes mellitus: Secondary | ICD-10-CM

## 2019-02-28 DIAGNOSIS — Z8249 Family history of ischemic heart disease and other diseases of the circulatory system: Secondary | ICD-10-CM

## 2019-02-28 DIAGNOSIS — I25118 Atherosclerotic heart disease of native coronary artery with other forms of angina pectoris: Secondary | ICD-10-CM | POA: Diagnosis not present

## 2019-02-28 DIAGNOSIS — W19XXXA Unspecified fall, initial encounter: Secondary | ICD-10-CM

## 2019-02-28 DIAGNOSIS — E860 Dehydration: Secondary | ICD-10-CM | POA: Diagnosis present

## 2019-02-28 DIAGNOSIS — R1110 Vomiting, unspecified: Secondary | ICD-10-CM

## 2019-02-28 DIAGNOSIS — Z87891 Personal history of nicotine dependence: Secondary | ICD-10-CM

## 2019-02-28 DIAGNOSIS — N170 Acute kidney failure with tubular necrosis: Secondary | ICD-10-CM | POA: Diagnosis present

## 2019-02-28 DIAGNOSIS — Z992 Dependence on renal dialysis: Secondary | ICD-10-CM | POA: Diagnosis not present

## 2019-02-28 DIAGNOSIS — E1122 Type 2 diabetes mellitus with diabetic chronic kidney disease: Secondary | ICD-10-CM | POA: Diagnosis not present

## 2019-02-28 DIAGNOSIS — E785 Hyperlipidemia, unspecified: Secondary | ICD-10-CM | POA: Diagnosis present

## 2019-02-28 DIAGNOSIS — W19XXXD Unspecified fall, subsequent encounter: Secondary | ICD-10-CM | POA: Diagnosis not present

## 2019-02-28 DIAGNOSIS — I2510 Atherosclerotic heart disease of native coronary artery without angina pectoris: Secondary | ICD-10-CM | POA: Diagnosis present

## 2019-02-28 DIAGNOSIS — Z79899 Other long term (current) drug therapy: Secondary | ICD-10-CM

## 2019-02-28 DIAGNOSIS — Z8673 Personal history of transient ischemic attack (TIA), and cerebral infarction without residual deficits: Secondary | ICD-10-CM

## 2019-02-28 DIAGNOSIS — Z6841 Body Mass Index (BMI) 40.0 and over, adult: Secondary | ICD-10-CM

## 2019-02-28 DIAGNOSIS — M25562 Pain in left knee: Secondary | ICD-10-CM | POA: Diagnosis present

## 2019-02-28 DIAGNOSIS — Z79890 Hormone replacement therapy (postmenopausal): Secondary | ICD-10-CM

## 2019-02-28 DIAGNOSIS — Z7901 Long term (current) use of anticoagulants: Secondary | ICD-10-CM

## 2019-02-28 DIAGNOSIS — I4819 Other persistent atrial fibrillation: Secondary | ICD-10-CM | POA: Diagnosis present

## 2019-02-28 DIAGNOSIS — N1832 Chronic kidney disease, stage 3b: Secondary | ICD-10-CM | POA: Diagnosis not present

## 2019-02-28 DIAGNOSIS — R5381 Other malaise: Secondary | ICD-10-CM | POA: Diagnosis not present

## 2019-02-28 DIAGNOSIS — S8992XA Unspecified injury of left lower leg, initial encounter: Secondary | ICD-10-CM | POA: Diagnosis not present

## 2019-02-28 DIAGNOSIS — I5042 Chronic combined systolic (congestive) and diastolic (congestive) heart failure: Secondary | ICD-10-CM | POA: Diagnosis not present

## 2019-02-28 DIAGNOSIS — Z888 Allergy status to other drugs, medicaments and biological substances status: Secondary | ICD-10-CM

## 2019-02-28 DIAGNOSIS — D649 Anemia, unspecified: Secondary | ICD-10-CM | POA: Diagnosis not present

## 2019-02-28 LAB — COMPREHENSIVE METABOLIC PANEL
ALT: 17 U/L (ref 0–44)
ALT: 17 U/L (ref 0–44)
AST: 27 U/L (ref 15–41)
AST: 26 U/L (ref 15–41)
Albumin: 2.8 g/dL — ABNORMAL LOW (ref 3.5–5.0)
Albumin: 2.8 g/dL — ABNORMAL LOW (ref 3.5–5.0)
Alkaline Phosphatase: 111 U/L (ref 38–126)
Alkaline Phosphatase: 122 U/L (ref 38–126)
Anion gap: 15 (ref 5–15)
Anion gap: 14 (ref 5–15)
BUN: 98 mg/dL — ABNORMAL HIGH (ref 8–23)
BUN: 94 mg/dL — ABNORMAL HIGH (ref 8–23)
CO2: 20 mmol/L — ABNORMAL LOW (ref 22–32)
CO2: 19 mmol/L — ABNORMAL LOW (ref 22–32)
Calcium: 8.1 mg/dL — ABNORMAL LOW (ref 8.9–10.3)
Calcium: 8.4 mg/dL — ABNORMAL LOW (ref 8.9–10.3)
Chloride: 95 mmol/L — ABNORMAL LOW (ref 98–111)
Chloride: 98 mmol/L (ref 98–111)
Creatinine, Ser: 3.96 mg/dL — ABNORMAL HIGH (ref 0.44–1.00)
Creatinine, Ser: 4 mg/dL — ABNORMAL HIGH (ref 0.44–1.00)
GFR calc Af Amer: 13 mL/min — ABNORMAL LOW (ref >60)
GFR calc Af Amer: 12 mL/min — ABNORMAL LOW (ref >60)
GFR calc non Af Amer: 11 mL/min — ABNORMAL LOW (ref >60)
GFR calc non Af Amer: 11 mL/min — ABNORMAL LOW (ref >60)
Glucose, Bld: 75 mg/dL (ref 70–99)
Glucose, Bld: 139 mg/dL — ABNORMAL HIGH (ref 70–99)
Potassium: 4.1 mmol/L (ref 3.5–5.1)
Potassium: 4.6 mmol/L (ref 3.5–5.1)
Sodium: 130 mmol/L — ABNORMAL LOW (ref 135–145)
Sodium: 131 mmol/L — ABNORMAL LOW (ref 135–145)
Total Bilirubin: 1.6 mg/dL — ABNORMAL HIGH (ref 0.3–1.2)
Total Bilirubin: 1.7 mg/dL — ABNORMAL HIGH (ref 0.3–1.2)
Total Protein: 8.4 g/dL — ABNORMAL HIGH (ref 6.5–8.1)
Total Protein: 8.5 g/dL — ABNORMAL HIGH (ref 6.5–8.1)

## 2019-02-28 LAB — CBC WITH DIFFERENTIAL/PLATELET
Abs Immature Granulocytes: 0.16 10*3/uL — ABNORMAL HIGH (ref 0.00–0.07)
Basophils Absolute: 0.1 10*3/uL (ref 0.0–0.1)
Basophils Relative: 1 %
Eosinophils Absolute: 0.2 10*3/uL (ref 0.0–0.5)
Eosinophils Relative: 2 %
HCT: 30 % — ABNORMAL LOW (ref 36.0–46.0)
Hemoglobin: 9.5 g/dL — ABNORMAL LOW (ref 12.0–15.0)
Immature Granulocytes: 2 %
Lymphocytes Relative: 7 %
Lymphs Abs: 0.5 10*3/uL — ABNORMAL LOW (ref 0.7–4.0)
MCH: 25.5 pg — ABNORMAL LOW (ref 26.0–34.0)
MCHC: 31.7 g/dL (ref 30.0–36.0)
MCV: 80.6 fL (ref 80.0–100.0)
Monocytes Absolute: 0.8 10*3/uL (ref 0.1–1.0)
Monocytes Relative: 12 %
Neutro Abs: 5.1 10*3/uL (ref 1.7–7.7)
Neutrophils Relative %: 76 %
Platelets: 281 10*3/uL (ref 150–400)
RBC: 3.72 MIL/uL — ABNORMAL LOW (ref 3.87–5.11)
RDW: 21.5 % — ABNORMAL HIGH (ref 11.5–15.5)
WBC: 6.8 10*3/uL (ref 4.0–10.5)
nRBC: 0 % (ref 0.0–0.2)

## 2019-02-28 LAB — TROPONIN I (HIGH SENSITIVITY)
Troponin I (High Sensitivity): 9 ng/L (ref <18)
Troponin I (High Sensitivity): 10 ng/L (ref <18)

## 2019-02-28 MED ORDER — FUROSEMIDE 10 MG/ML IJ SOLN: 5.0000 mg/h | INTRAVENOUS | Status: DC

## 2019-02-28 MED ORDER — HYDROCODONE-ACETAMINOPHEN 5-325 MG PO TABS
2.0000 | ORAL_TABLET | Freq: Once | ORAL | Status: AC
  Administered 2019-02-28: 2 via ORAL
  Filled 2019-02-28: qty 2

## 2019-02-28 MED ORDER — ONDANSETRON HCL 4 MG/2ML IJ SOLN: 4.0000 mg | Freq: Four times a day (QID) | INTRAMUSCULAR | Status: DC | PRN

## 2019-02-28 MED ORDER — GLIPIZIDE 10 MG PO TABS: 10.0000 mg | ORAL_TABLET | Freq: Two times a day (BID) | ORAL | Status: DC

## 2019-02-28 MED ORDER — ACETAMINOPHEN 325 MG PO TABS
650.0000 mg | ORAL_TABLET | ORAL | Status: DC | PRN
  Administered 2019-03-01 (×2): 650 mg via ORAL
  Filled 2019-02-28 (×3): qty 2

## 2019-02-28 MED ORDER — SODIUM CHLORIDE 0.9% FLUSH
3.0000 mL | Freq: Two times a day (BID) | INTRAVENOUS | Status: DC
  Administered 2019-03-01 – 2019-03-04 (×6): 3 mL via INTRAVENOUS

## 2019-02-28 MED ORDER — FUROSEMIDE 10 MG/ML IJ SOLN
5.0000 mg/h | INTRAVENOUS | Status: DC
  Administered 2019-03-01: 5 mg/h via INTRAVENOUS
  Filled 2019-02-28: qty 25

## 2019-02-28 MED ORDER — METOPROLOL TARTRATE 25 MG PO TABS
25.0000 mg | ORAL_TABLET | Freq: Two times a day (BID) | ORAL | Status: DC
  Administered 2019-03-01: 09:00:00 25 mg via ORAL
  Filled 2019-02-28 (×2): qty 1

## 2019-02-28 MED ORDER — APIXABAN 5 MG PO TABS
5.0000 mg | ORAL_TABLET | Freq: Two times a day (BID) | ORAL | Status: DC
  Administered 2019-03-01 – 2019-03-04 (×7): 5 mg via ORAL
  Filled 2019-02-28 (×8): qty 1

## 2019-02-28 MED ORDER — INSULIN ASPART 100 UNIT/ML ~~LOC~~ SOLN: 0.0000 [IU] | Freq: Every day | SUBCUTANEOUS | Status: DC

## 2019-02-28 MED ORDER — LEVOTHYROXINE SODIUM 50 MCG PO TABS
50.0000 ug | ORAL_TABLET | Freq: Every day | ORAL | Status: DC
  Administered 2019-03-01 – 2019-03-04 (×4): 50 ug via ORAL
  Filled 2019-02-28 (×4): qty 1

## 2019-02-28 MED ORDER — ONDANSETRON 4 MG PO TBDP
4.0000 mg | ORAL_TABLET | Freq: Once | ORAL | Status: AC
  Administered 2019-02-28: 4 mg via ORAL
  Filled 2019-02-28: qty 1

## 2019-02-28 MED ORDER — SODIUM CHLORIDE 0.9% FLUSH: 3.0000 mL | INTRAVENOUS | Status: DC | PRN

## 2019-02-28 MED ORDER — INSULIN ASPART 100 UNIT/ML ~~LOC~~ SOLN
0.0000 [IU] | Freq: Three times a day (TID) | SUBCUTANEOUS | Status: DC
  Administered 2019-03-01: 3 [IU] via SUBCUTANEOUS
  Administered 2019-03-01: 2 [IU] via SUBCUTANEOUS
  Administered 2019-03-02: 5 [IU] via SUBCUTANEOUS
  Administered 2019-03-02: 3 [IU] via SUBCUTANEOUS
  Administered 2019-03-03: 5 [IU] via SUBCUTANEOUS
  Administered 2019-03-03: 3 [IU] via SUBCUTANEOUS
  Administered 2019-03-03 – 2019-03-04 (×2): 5 [IU] via SUBCUTANEOUS
  Filled 2019-02-28 (×8): qty 1

## 2019-02-28 MED ORDER — INSULIN DEGLUDEC 200 UNIT/ML ~~LOC~~ SOPN: 25.0000 [IU] | PEN_INJECTOR | Freq: Every day | SUBCUTANEOUS | Status: DC

## 2019-02-28 MED ORDER — ALPRAZOLAM 0.25 MG PO TABS: 0.2500 mg | ORAL_TABLET | Freq: Two times a day (BID) | ORAL | Status: DC | PRN

## 2019-02-28 MED ORDER — PANTOPRAZOLE SODIUM 40 MG PO TBEC
40.0000 mg | DELAYED_RELEASE_TABLET | Freq: Every day | ORAL | Status: DC
  Administered 2019-03-01 – 2019-03-04 (×4): 40 mg via ORAL
  Filled 2019-02-28 (×4): qty 1

## 2019-02-28 MED ORDER — ZOLPIDEM TARTRATE 5 MG PO TABS: 5.0000 mg | ORAL_TABLET | Freq: Every evening | ORAL | Status: DC | PRN

## 2019-02-28 MED ORDER — ASPIRIN EC 81 MG PO TBEC
81.0000 mg | DELAYED_RELEASE_TABLET | Freq: Every day | ORAL | Status: DC
  Administered 2019-03-01 – 2019-03-04 (×4): 81 mg via ORAL
  Filled 2019-02-28 (×4): qty 1

## 2019-02-28 MED ORDER — SODIUM CHLORIDE 0.9 % IV SOLN
250.0000 mL | INTRAVENOUS | Status: DC | PRN
  Administered 2019-03-01: 02:00:00 250 mL via INTRAVENOUS

## 2019-02-28 MED ORDER — ONDANSETRON HCL 4 MG PO TABS: 4.0000 mg | ORAL_TABLET | Freq: Three times a day (TID) | ORAL | Status: DC | PRN

## 2019-02-28 MED ORDER — ATORVASTATIN CALCIUM 20 MG PO TABS
40.0000 mg | ORAL_TABLET | Freq: Every day | ORAL | Status: DC
  Administered 2019-03-01 – 2019-03-03 (×3): 40 mg via ORAL
  Filled 2019-02-28 (×3): qty 2

## 2019-02-28 NOTE — H&P (Signed)
[place] [facility] [place] NAME: Meghan [name]    MR#:  [id number]  DATE OF BIRTH:  05-Feb-1951  DATE OF ADMISSION:  02/28/2019  PRIMARY CARE PHYSICIAN: [name], MD   REQUESTING/REFERRING PHYSICIAN: Lenise Arena, MD  CHIEF COMPLAINT:   Chief Complaint  Patient presents with  . Abnormal Lab  . Weakness    HISTORY OF PRESENT ILLNESS:  Meghan [name]  is a 68 y.o. Caucasian female with a known history of stage IIIb chronic kidney disease, coronary artery disease, diabetes mellitus and systolic CHF with a EF of 25 to 30% as of 12/14/2018 with severe left atrial and moderate respiratory location moderate tricuspid regurgitation, who presented to emergency room with abnormal blood work that was ordered by her cardiologist this morning and showed elevated BUN and creatinine above previous levels.  The patient admitted to recent dyspnea with associated dyspnea on exertion without paroxysmal nocturnal dyspnea or orthopnea.  She has lower extremity edema which has not significantly worsened.  She was feeling significantly weak and dizzy in the ER that she fell while going to the bathroom with the nurse and was held to the floor with no syncope or injuries.  She has been having diminished urine output lately.  She denied any cough or hemoptysis.  No dysuria or hematuria or flank pain.  No recent exposure to COVID-19.  Upon presentation to the emergency room, heart rate was 59 respiratory 22 with pulse currently 94% on 2 L O2 by nasal cannula.  Labs revealed CMP with a BUN of 94 and creatinine of 4 compared to 98 and 3.96 earlier this morning, 72 and 2.69 on 02/1266 and 1.98 on 02/08/2019.  CO2 was 19 today compared to 25 8 days ago.  CBC showed anemia at baseline with hemoglobin 9.5 and hematocrit 30.  High-sensitivity troponin I was 9.  Two-view chest x-ray showed congestive heart failure with mild pulmonary edema.  Dr. Candiss Norse was notified about the patient and recommended IV  Lasix drip.  She will be moved to telemetry bed for further evaluation and management. PAST MEDICAL HISTORY:   Past Medical History:  Diagnosis Date  . Carotid arterial disease ([place])    a. 02/2012 s/p L CEA.  . CKD stage G3b/A1, GFR 30-44 and albumin creatinine ratio <30 mg/g   . Coronary artery disease    a. 02/2012 Lexi MV: Anterior, anteroseptal, septal, apical infarct with mild peri-infarct ischemia, EF 41%- medically managed due to anemia; b. 03/2017 Cath: LM nl, LAD 99ost/p/m - faint L->L collaterals, RI 20ost, LCX 29m, RCA 20p, 26m-->Med Rx for chronic subtotal LAD dzs. Avoid R Radial access in future.  . Diabetes mellitus without complication ([place])    Type II  . HFrEF (heart failure with reduced ejection fraction) ([place])    a. 06/2013 Echo: EF 35-40%; b. 07/2016 Echo: EF 25-30%; c. 11/2016 Echo: EF 20-25%; d. 12/2018 Echo: EF 25-30%, diff HK; e. 12/2018 Echo: EF 25-30%.  . Hyperlipidemia   . Hypertension   . Ischemic cardiomyopathy    a. 06/2013 Echo: EF 35-40%, glob HK; b. 07/2016 Echo: EF 25-30%, sev mid-apicalanteroseptal, ant, apical HK; c. 11/2016 Echo: EF 20-25%, antsept DK, Gr2 DD; d. 12/2018 Echo: EF 25-30%, diff HK.  . Morbid obesity ([place])   . Normocytic anemia   . Persistent atrial fibrillation (HCC)    a. CHA2DS2VASc = 7-->eliquis  . Stroke [facility]) 2013    PAST SURGICAL HISTORY:   Past Surgical History:  Procedure Laterality  Date  . AMPUTATION TOE Left 09/22/2016   Procedure: AMPUTATION TOE;  Surgeon: Albertine Patricia, DPM;  Location: [facility] ORS;  Service: Podiatry;  Laterality: Left;  . CARDIOVERSION N/A 07/28/2016   Procedure: CARDIOVERSION;  Surgeon: Wellington Hampshire, MD;  Location: [facility] ORS;  Service: Cardiovascular;  Laterality: N/A;  . CARDIOVERSION N/A 08/07/2016   Procedure: CARDIOVERSION;  Surgeon: Wellington Hampshire, MD;  Location: [facility] ORS;  Service: Cardiovascular;  Laterality: N/A;  . CARDIOVERSION N/A 01/03/2019   Procedure: CARDIOVERSION;  Surgeon: Minna Merritts,  MD;  Location: [facility] ORS;  Service: Cardiovascular;  Laterality: N/A;  . CAROTID ENDARTERECTOMY     [facility]; Dr. Lucky Cowboy  . CHOLECYSTECTOMY    . ESOPHAGOGASTRODUODENOSCOPY (EGD) WITH PROPOFOL N/A 01/25/2019   Procedure: ESOPHAGOGASTRODUODENOSCOPY (EGD) WITH PROPOFOL;  Surgeon: Virgel Manifold, MD;  Location: [facility] ENDOSCOPY;  Service: Endoscopy;  Laterality: N/A;  . foot infection Right   . LEG SURGERY     right;infection  . RIGHT/LEFT HEART CATH AND CORONARY ANGIOGRAPHY Bilateral 03/12/2017   Procedure: RIGHT/LEFT HEART CATH AND CORONARY ANGIOGRAPHY;  Surgeon: Wellington Hampshire, MD;  Location: [place] CV LAB;  Service: Cardiovascular;  Laterality: Bilateral;  . TONSILLECTOMY      SOCIAL HISTORY:   Social History   Tobacco Use  . Smoking status: Former Smoker    Packs/day: 0.50    Years: 5.00    Pack years: 2.50    Types: Cigarettes  . Smokeless tobacco: Never Used  Substance Use Topics  . Alcohol use: No    FAMILY HISTORY:   Family History  Problem Relation Age of Onset  . Heart disease Mother   . Diabetes Mother   . Heart disease Father   . Hypertension Father   . Diabetes Maternal Grandmother     DRUG ALLERGIES:   Allergies  Allergen Reactions  . Spironolactone     Hyperkalemia     REVIEW OF SYSTEMS:   ROS As per history of present illness. All pertinent systems were reviewed above. Constitutional,  HEENT, cardiovascular, respiratory, GI, GU, musculoskeletal, neuro, psychiatric, endocrine,  integumentary and hematologic systems were reviewed and are otherwise  negative/unremarkable except for positive findings mentioned above in the HPI.   MEDICATIONS AT HOME:   Prior to Admission medications   Medication Sig Start Date End Date Taking? Authorizing Provider  apixaban (ELIQUIS) 5 MG TABS tablet Take 1 tablet (5 mg total) by mouth 2 (two) times daily. 02/20/19   Theora Gianotti, NP  atorvastatin (LIPITOR) 40 MG tablet Take 1 tablet (40 mg  total) by mouth daily. 02/20/19   Theora Gianotti, NP  glipiZIDE (GLUCOTROL) 10 MG tablet Take 10 mg by mouth 2 (two) times daily. Dr Honor Junes 08/13/15   [provider]  Insulin Degludec 200 UNIT/ML SOPN Inject 25 Units into the skin daily at 12 noon. Dr Honor Junes (1300) 02/18/18   [provider]  levothyroxine (SYNTHROID) 50 MCG tablet Take 50 mcg by mouth daily before breakfast.    [provider]  losartan (COZAAR) 25 MG tablet Take 0.5 tablets (12.5 mg total) by mouth daily. 02/20/19   Theora Gianotti, NP  metoprolol tartrate (LOPRESSOR) 25 MG tablet Take 1 tablet (25 mg total) by mouth 2 (two) times daily. 02/20/19   Theora Gianotti, NP  ondansetron (ZOFRAN) 4 MG tablet Take 1 tablet (4 mg total) by mouth every 8 (eight) hours as needed for nausea or vomiting. 02/20/19   Theora Gianotti, NP  pantoprazole ([place]) 40  MG tablet Take 1 tablet (40 mg total) by mouth daily. 02/08/19   Mayo, Pete Pelt, MD  torsemide (DEMADEX) 20 MG tablet Take 2 tablets (40 mg total) by mouth 2 (two) times daily. 01/09/19   Wellington Hampshire, MD  triamcinolone cream (KENALOG) 0.5 % Apply 1 application topically 2 (two) times daily as needed (psoriasis).     [provider]      VITAL SIGNS:  Blood pressure 108/72, pulse 95, temperature 98.7 F (37.1 C), temperature source Oral, resp. rate 14, height 5\' 7"  (1.702 m), weight 113.4 kg, SpO2 96 %.  PHYSICAL EXAMINATION:  Physical Exam  GENERAL:  68 y.o.-year-old Caucasian female patient lying in the bed in mild respiratory distress with conversational dyspnea. EYES: Pupils equal, round, reactive to light and accommodation. No scleral icterus. Extraocular muscles intact.  HEENT: Head atraumatic, normocephalic. Oropharynx and nasopharynx clear.  NECK:  Supple, no jugular venous distention. No thyroid enlargement, no tenderness.  LUNGS: Diminished bibasal breath sounds with bibasal rales.  CARDIOVASCULAR: Regular rate and rhythm, S1, S2 normal. No murmurs, rubs, or gallops.  ABDOMEN: Soft, nondistended, nontender. Bowel sounds present. No organomegaly or mass.  EXTREMITIES: 1+ bilateral lower extremity pitting edema with no clubbing or cyanosis. NEUROLOGIC: Cranial nerves II through XII are intact. Muscle strength 5/5 in all extremities. Sensation intact. Gait not checked.  PSYCHIATRIC: The patient is alert and oriented x 3.  Normal affect and good eye contact. SKIN: No obvious rash, lesion, or ulcer.   LABORATORY PANEL:   CBC Recent Labs  Lab 02/28/19 1630  WBC 6.8  HGB 9.5*  HCT 30.0*  PLT 281   ------------------------------------------------------------------------------------------------------------------  Chemistries  Recent Labs  Lab 02/28/19 1630  NA 131*  K 4.6  CL 98  CO2 19*  GLUCOSE 139*  BUN 94*  CREATININE 4.00*  CALCIUM 8.4*  AST 26  ALT 17  ALKPHOS 122  BILITOT 1.7*   ------------------------------------------------------------------------------------------------------------------  Cardiac Enzymes No results for input(s): TROPONINI in the last 168 hours. ------------------------------------------------------------------------------------------------------------------  RADIOLOGY:  Dg Chest 2 View  Result Date: 02/28/2019 CLINICAL DATA:  Short of breath EXAM: CHEST - 2 VIEW COMPARISON:  02/01/2019 FINDINGS: Marked cardiomegaly. Vascular congestion with interstitial edema has progressed from the prior study. No significant pleural effusion. IMPRESSION: Congestive heart failure with mild pulmonary edema. Electronically Signed   By: Franchot Gallo M.D.   On: 02/28/2019 17:48      IMPRESSION AND PLAN:   1.  Acute on chronic systolic congestive heart failure likely due to fluid overload. The patient will be admitted to a telemetry bed and will be diuresed with IV Lasix drip.  Will follow serial cardiac enzymes.  Will obtain a cardiology  consultation in a.m. the patient had a fairly recent 2D echo attention code.  I notified Dr. Oval Linsey about the patient.  2.  Acute kidney injury on stage IIIb chronic kidney disease.  This is possibly secondary to renal hypoperfusion due to acute CHF.  Nephrology consultation will be obtained by Dr. Candiss Norse who is aware about the patient.  We will place the patient on IV Lasix drip per his recommendation.  We will hold off Cozaar for now.  3.  Type 2 diabetes mellitus with chronic kidney disease.  We will continue glipizide place the patient on supplemental coverage with NovoLog as well as continue her basal coverage.  4.  Hypothyroidism.  Patient will be continued on Synthroid and will check TSH level.  5.  Hypertension.  We will continue Lopressor  and hold off Cozaar.  6.  Dyslipidemia.  We will continue statin therapy.  7.  Chronic atrial fibrillation.  We will continue Eliquis and Lopressor.  8.  DVT prophylaxis.  We will continue Eliquis.   All the records are reviewed and case discussed with ED provider. The plan of care was discussed in details with the patient (and family). I answered all questions. The patient agreed to proceed with the above mentioned plan. Further management will depend upon hospital course.   CODE STATUS: Full code  TOTAL TIME TAKING CARE OF THIS PATIENT:  minutes.    Christel Mormon M.D on 02/28/2019 at 9:36 PM  [facility]   From 7 PM-7 AM, contact night-coverage [email or web address]  CC: Primary care physician; [name], MD   Note: This dictation was prepared with Dragon dictation along with smaller phrase technology. Any transcriptional errors that result from this process are unintentional.

## 2019-02-28 NOTE — Telephone Encounter (Signed)
Pt c/o medication issue:  1. Name of Medication: medicines in general   2. How are you currently taking this medication (dosage and times per day)?   3. Are you having a reaction (difficulty breathing--STAT)?  Yeast infection and stomach upset   4. What is your medication issue?  Patient states she has tried otc for yeast infection and it is not working.  she also says other meds are making her sick to stomach.    Patient says she has been unable to reach pcp office but agrees to try again.    Please call

## 2019-02-28 NOTE — Telephone Encounter (Signed)
Attempted to return the patient's call. Unable to lmom patient's voiced mail is not set up.

## 2019-02-28 NOTE — ED Provider Notes (Signed)
[facility] Emergency Department Provider Note       Time seen: ----------------------------------------- 9:05 PM on 02/28/2019 -----------------------------------------   I have reviewed the triage vital signs and the nursing notes.  HISTORY   Chief Complaint Abnormal Lab and Weakness    HPI Meghan [name] is a 67 y.o. female with a history of coronary artery disease, chronic kidney disease, diabetes, heart failure, hyperlipidemia, hypertension, anemia who presents to the ED for abnormal lab work.  She reports she is in kidney failure.  She complains of pain all over, weakness and heart pain.  Also complains of yeast infection.  Pain is 8 out of 10 in her back.  Past Medical History:  Diagnosis Date  . Carotid arterial disease ([place])    a. 02/2012 s/p L CEA.  . CKD stage G3b/A1, GFR 30-44 and albumin creatinine ratio <30 mg/g   . Coronary artery disease    a. 02/2012 Lexi MV: Anterior, anteroseptal, septal, apical infarct with mild peri-infarct ischemia, EF 41%- medically managed due to anemia; b. 03/2017 Cath: LM nl, LAD 99ost/p/m - faint L->L collaterals, RI 20ost, LCX 41m, RCA 20p, 28m-->Med Rx for chronic subtotal LAD dzs. Avoid R Radial access in future.  . Diabetes mellitus without complication ([place])    Type II  . HFrEF (heart failure with reduced ejection fraction) ([place])    a. 06/2013 Echo: EF 35-40%; b. 07/2016 Echo: EF 25-30%; c. 11/2016 Echo: EF 20-25%; d. 12/2018 Echo: EF 25-30%, diff HK; e. 12/2018 Echo: EF 25-30%.  . Hyperlipidemia   . Hypertension   . Ischemic cardiomyopathy    a. 06/2013 Echo: EF 35-40%, glob HK; b. 07/2016 Echo: EF 25-30%, sev mid-apicalanteroseptal, ant, apical HK; c. 11/2016 Echo: EF 20-25%, antsept DK, Gr2 DD; d. 12/2018 Echo: EF 25-30%, diff HK.  . Morbid obesity ([place])   . Normocytic anemia   . Persistent atrial fibrillation (HCC)    a. CHA2DS2VASc = 7-->eliquis  . Stroke [facility]) 2013    Patient Active Problem List   Diagnosis  Date Noted  . Procedure not carried out because of contraindication   . [place]ena   . Dilated cardiomyopathy ([place]) 12/15/2018  . Hypoglycemia due to type 2 diabetes mellitus ([place]) 12/15/2018  . Acute on chronic combined systolic and diastolic CHF (congestive heart failure) ([place]) 12/13/2018  . Sepsis ([place]) 03/30/2017  . Elevated TSH 12/29/2016  . Psoriasis 12/26/2016  . Obesity, Class III, BMI 40-49.9 (morbid obesity) ([place]) 12/26/2016  . Toe osteomyelitis, left ([place]) 09/21/2016  . CKD (chronic kidney disease), stage III 07/26/2016  . History of CVA (cerebrovascular accident) without residual deficits 09/03/2013  . Hyperlipidemia 09/03/2013  . Type 2 diabetes mellitus with peripheral neuropathy ([place]) 09/03/2013  . Hyperlipidemia due to type 2 diabetes mellitus ([place]) 09/03/2013  . Persistent atrial fibrillation 07/16/2013  . Paroxysmal atrial fibrillation ([place]) 07/16/2013  . Ischemic cardiomyopathy 07/16/2013  . Hypertension 07/16/2013  . Chronic systolic heart failure ([place]) 04/12/2012  . Coronary atherosclerosis   . Dyspnea 03/01/2012    Past Surgical History:  Procedure Laterality Date  . AMPUTATION TOE Left 09/22/2016   Procedure: AMPUTATION TOE;  Surgeon: Albertine Patricia, DPM;  Location: [facility] ORS;  Service: Podiatry;  Laterality: Left;  . CARDIOVERSION N/A 07/28/2016   Procedure: CARDIOVERSION;  Surgeon: Wellington Hampshire, MD;  Location: [facility] ORS;  Service: Cardiovascular;  Laterality: N/A;  . CARDIOVERSION N/A 08/07/2016   Procedure: CARDIOVERSION;  Surgeon: Wellington Hampshire, MD;  Location: [facility] ORS;  Service: Cardiovascular;  Laterality: N/A;  .  CARDIOVERSION N/A 01/03/2019   Procedure: CARDIOVERSION;  Surgeon: Minna Merritts, MD;  Location: [facility] ORS;  Service: Cardiovascular;  Laterality: N/A;  . CAROTID ENDARTERECTOMY     [facility]; Dr. Lucky Cowboy  . CHOLECYSTECTOMY    . ESOPHAGOGASTRODUODENOSCOPY (EGD) WITH PROPOFOL N/A 01/25/2019   Procedure: ESOPHAGOGASTRODUODENOSCOPY (EGD) WITH  PROPOFOL;  Surgeon: Virgel Manifold, MD;  Location: [facility] ENDOSCOPY;  Service: Endoscopy;  Laterality: N/A;  . foot infection Right   . LEG SURGERY     right;infection  . RIGHT/LEFT HEART CATH AND CORONARY ANGIOGRAPHY Bilateral 03/12/2017   Procedure: RIGHT/LEFT HEART CATH AND CORONARY ANGIOGRAPHY;  Surgeon: Wellington Hampshire, MD;  Location: [place] CV LAB;  Service: Cardiovascular;  Laterality: Bilateral;  . TONSILLECTOMY      Allergies Spironolactone  Social History Social History   Tobacco Use  . Smoking status: Former Smoker    Packs/day: 0.50    Years: 5.00    Pack years: 2.50    Types: Cigarettes  . Smokeless tobacco: Never Used  Substance Use Topics  . Alcohol use: No  . Drug use: No    Review of Systems Constitutional: Negative for fever. Cardiovascular: Negative for chest pain. Respiratory: Negative for shortness of breath. Gastrointestinal: Negative for abdominal pain, vomiting and diarrhea. Musculoskeletal: Positive for back pain Skin: Negative for rash. Neurological: Negative for headaches, positive for weakness  All systems negative/normal/unremarkable except as stated in the HPI  ____________________________________________   PHYSICAL EXAM:  VITAL SIGNS: ED Triage Vitals  Enc Vitals Group     BP 02/28/19 1622 109/61     Pulse Rate 02/28/19 1622 (!) 59     Resp 02/28/19 1622 (!) 22     Temp 02/28/19 1622 98.7 F (37.1 C)     Temp Source 02/28/19 1622 Oral     SpO2 02/28/19 1933 94 %     Weight 02/28/19 1623 250 lb (113.4 kg)     Height 02/28/19 1623 5\' 7"  (1.702 m)     Head Circumference --      [place] Flow --      Pain Score 02/28/19 1623 8     Pain Loc --      Pain Edu? --      Excl. in [place]? --    Constitutional: Alert and oriented. Well appearing and in no distress. Eyes: Conjunctivae are normal. Normal extraocular movements. ENT      Head: Normocephalic and atraumatic.      Nose: No congestion/rhinnorhea.      Mouth/Throat:  Mucous membranes are moist.      Neck: No stridor. Cardiovascular: Normal rate, regular rhythm. No murmurs, rubs, or gallops. Respiratory: Normal respiratory effort without tachypnea nor retractions. Breath sounds are clear and equal bilaterally. No wheezes/rales/rhonchi. Gastrointestinal: Soft and nontender. Normal bowel sounds Musculoskeletal: Nontender with normal range of motion in extremities. No lower extremity tenderness nor edema. Neurologic:  Normal speech and language. No gross focal neurologic deficits are appreciated.  Skin:  Skin is warm, dry and intact. No rash noted. Psychiatric: Mood and affect are normal. Speech and behavior are normal.  ____________________________________________  EKG: Interpreted by me.  Atrial fibrillation with rate of 74 bpm, low voltage, normal axis, normal QT  ____________________________________________  ED COURSE:  As part of my medical decision making, I reviewed the following data within the [place] History obtained from family if available, nursing notes, old chart and ekg, as well as notes from prior ED visits. Patient presented for renal failure, we  will assess with labs and imaging as indicated at this time.   Procedures  Meghan [name] was evaluated in Emergency Department on 02/28/2019 for the symptoms described in the history of present illness. She was evaluated in the context of the global COVID-19 pandemic, which necessitated consideration that the patient might be at risk for infection with the SARS-CoV-2 virus that causes COVID-19. Institutional protocols and algorithms that pertain to the evaluation of patients at risk for COVID-19 are in a state of rapid change based on information released by regulatory bodies including the CDC and federal and state organizations. These policies and algorithms were followed during the patient's care in the ED.  ____________________________________________   LABS (pertinent  positives/negatives)  Labs Reviewed  CBC WITH DIFFERENTIAL/PLATELET - Abnormal; Notable for the following components:      Result Value   RBC 3.72 (*)    Hemoglobin 9.5 (*)    HCT 30.0 (*)    MCH 25.5 (*)    RDW 21.5 (*)    Lymphs Abs 0.5 (*)    Abs Immature Granulocytes 0.16 (*)    All other components within normal limits  COMPREHENSIVE METABOLIC PANEL - Abnormal; Notable for the following components:   Sodium 131 (*)    CO2 19 (*)    Glucose, Bld 139 (*)    BUN 94 (*)    Creatinine, Ser 4.00 (*)    Calcium 8.4 (*)    Total Protein 8.5 (*)    Albumin 2.8 (*)    Total Bilirubin 1.7 (*)    GFR calc non Af Amer 11 (*)    GFR calc Af Amer 12 (*)    All other components within normal limits  TROPONIN I (HIGH SENSITIVITY)  TROPONIN I (HIGH SENSITIVITY)    RADIOLOGY Images were viewed by me  Chest x-ray reveals IMPRESSION:  Congestive heart failure with mild pulmonary edema.  ____________________________________________   DIFFERENTIAL DIAGNOSIS   Renal failure, electrolyte abnormality, weakness, dehydration, occult infection  FINAL ASSESSMENT AND PLAN  Renal failure   Plan: The patient had presented for abnormal lab work indicating renal failure. Patient's labs did indicate acute renal failure with a creatinine of 4.  Creatinine has increased from 2.69 eight days ago.  Patient's imaging revealed congestive heart failure with mild pulmonary edema.  She does not need emergent dialysis but will need to be admitted with nephrology consultation.  I will discuss with the hospitalist.   Laurence Aly, MD    Note: This note was generated in part or whole with voice recognition software. Voice recognition is usually quite accurate but there are transcription errors that can and very often do occur. I apologize for any typographical errors that were not detected and corrected.     Earleen Newport, MD 02/28/19 2111

## 2019-02-28 NOTE — ED Notes (Signed)
Dried stool cleaned off of pt. Peri area noted to be red and raw, pt very sensitive.

## 2019-02-28 NOTE — Telephone Encounter (Signed)
-----   Message from Theora Gianotti, NP sent at 02/28/2019 12:52 PM EST ----- Please reach out to pts dtr Fernand Parkins, who assists greatly w/ her care.  Ms. Oleksa kidney's are worse than last week.  Creat was 2.69, now 3.96.  She was trending around 2 prior to hospital discharge.  With worsening renal fxn, we are forced to hold torsemide for 2 days and then drop dose to 20mg  daily and we can f/u labs on 11/24.  If however, weight, edema, or dyspnea has been worsening, she will need to present to ED b/c we won't be able to safely manage her fluid and kidney function as an outpatient.

## 2019-02-28 NOTE — ED Triage Notes (Addendum)
Patient sent by PCP for abnormal labs. She reports she is in kidney failure. Patient has labored breathing in triage. C/o pain all over body, weakness, and heart pain. Also c/o yeast infection

## 2019-02-28 NOTE — Telephone Encounter (Signed)
Call to patient at the request of Ignacia Bayley, NP for increase in Cr values.   Pt reports she has not been feeling well. She is weak, dizzy and vomiting. Per daughter urine output is down. Wt today 250 lbs.   After speaking to daughter, she allowed me to explain the best option at this time would be to report to ED for workup of sx and worsening kidney failure. Pt audibly SOB, speaking in fragments. No wheezing or coughing heard.   daughter agrees to take pt to ED at [facility] for admission.

## 2019-03-01 DIAGNOSIS — R531 Weakness: Secondary | ICD-10-CM

## 2019-03-01 DIAGNOSIS — I5042 Chronic combined systolic (congestive) and diastolic (congestive) heart failure: Secondary | ICD-10-CM

## 2019-03-01 LAB — BASIC METABOLIC PANEL
Anion gap: 12 (ref 5–15)
BUN: 96 mg/dL — ABNORMAL HIGH (ref 8–23)
CO2: 21 mmol/L — ABNORMAL LOW (ref 22–32)
Calcium: 8.3 mg/dL — ABNORMAL LOW (ref 8.9–10.3)
Chloride: 100 mmol/L (ref 98–111)
Creatinine, Ser: 3.61 mg/dL — ABNORMAL HIGH (ref 0.44–1.00)
GFR calc Af Amer: 14 mL/min — ABNORMAL LOW (ref >60)
GFR calc non Af Amer: 12 mL/min — ABNORMAL LOW (ref >60)
Glucose, Bld: 81 mg/dL (ref 70–99)
Potassium: 4.1 mmol/L (ref 3.5–5.1)
Sodium: 133 mmol/L — ABNORMAL LOW (ref 135–145)

## 2019-03-01 LAB — HEMOGLOBIN A1C
Hgb A1c MFr Bld: 6.6 % — ABNORMAL HIGH (ref 4.8–5.6)
Mean Plasma Glucose: 142.72 mg/dL

## 2019-03-01 LAB — GLUCOSE, CAPILLARY
Glucose-Capillary: 123 mg/dL — ABNORMAL HIGH (ref 70–99)
Glucose-Capillary: 144 mg/dL — ABNORMAL HIGH (ref 70–99)
Glucose-Capillary: 169 mg/dL — ABNORMAL HIGH (ref 70–99)
Glucose-Capillary: 177 mg/dL — ABNORMAL HIGH (ref 70–99)
Glucose-Capillary: 180 mg/dL — ABNORMAL HIGH (ref 70–99)
Glucose-Capillary: 72 mg/dL (ref 70–99)

## 2019-03-01 LAB — SARS CORONAVIRUS 2 (TAT 6-24 HRS): SARS Coronavirus 2: NEGATIVE

## 2019-03-01 LAB — TSH: TSH: 6.321 u[IU]/mL — ABNORMAL HIGH (ref 0.350–4.500)

## 2019-03-01 MED ORDER — SODIUM CHLORIDE 0.9 % IV SOLN
INTRAVENOUS | Status: AC
  Administered 2019-03-01 – 2019-03-02 (×2): via INTRAVENOUS

## 2019-03-01 MED ORDER — METOPROLOL TARTRATE 25 MG PO TABS
25.0000 mg | ORAL_TABLET | Freq: Two times a day (BID) | ORAL | Status: DC
  Administered 2019-03-01 – 2019-03-04 (×6): 25 mg via ORAL
  Filled 2019-03-01 (×5): qty 1

## 2019-03-01 NOTE — Progress Notes (Addendum)
[place] [facility] [place] NAME: Meghan [name]    MR#:  [id number]  DATE OF BIRTH:  [birth year]  SUBJECTIVE:   Patient came in with increasing fatigue ability weakness and abnormal elevated creatinine. She was asked by cardiology office to come to the ER with worsening creatinine. Feels very weak. Has knee pain after she had fallen the ER without any trauma REVIEW OF SYSTEMS:   Review of Systems  Constitutional: Negative for chills, fever and weight loss.  HENT: Negative for ear discharge, ear pain and nosebleeds.   Eyes: Negative for blurred vision, pain and discharge.  Respiratory: Positive for shortness of breath. Negative for sputum production, wheezing and stridor.   Cardiovascular: Negative for chest pain, palpitations, orthopnea and PND.  Gastrointestinal: Negative for abdominal pain, diarrhea, nausea and vomiting.  Genitourinary: Negative for frequency and urgency.  Musculoskeletal: Positive for falls and joint pain. Negative for back pain.  Neurological: Positive for weakness. Negative for sensory change, speech change and focal weakness.  Psychiatric/Behavioral: Negative for depression and hallucinations. The patient is not nervous/anxious.    Tolerating Diet:yes Tolerating PT: pending  at baseline patient gets out of bed to chair and bedside commode. Does not ambulate much at home.  DRUG ALLERGIES:   Allergies  Allergen Reactions  . Spironolactone     Hyperkalemia     VITALS:  Blood pressure 102/61, pulse 61, temperature 97.8 F (36.6 C), temperature source Oral, resp. rate 19, height 5\' 7"  (1.702 m), weight 118 kg, SpO2 94 %.  PHYSICAL EXAMINATION:   Physical Exam  GENERAL:  68 y.o.-year-old patient lying in the bed with no acute distress. Morbidly obese EYES: Pupils equal, round, reactive to light and accommodation. No scleral icterus. HEENT: Head atraumatic, normocephalic. Oropharynx and nasopharynx clear. Oral mucosa  dry NECK:  Supple, no jugular venous distention. No thyroid enlargement, no tenderness.  LUNGS: Normal shallow breath sounds bilaterally, no wheezing, rales, rhonchi. No use of accessory muscles of respiration.  CARDIOVASCULAR: S1, S2 normal. No murmurs, rubs, or gallops.  ABDOMEN: Soft, nontender, nondistended. Bowel sounds present. No organomegaly or mass.  EXTREMITIES: No cyanosis, clubbing  ++ chronic edema b/l.    NEUROLOGIC: Cranial nerves II through XII are intact. No focal Motor or sensory deficits b/l.   PSYCHIATRIC:  patient is alert and oriented x 3.  SKIN: No obvious rash, lesion, or ulcer.   LABORATORY PANEL:  CBC Recent Labs  Lab 02/28/19 1630  WBC 6.8  HGB 9.5*  HCT 30.0*  PLT 281    Chemistries  Recent Labs  Lab 02/28/19 1630 03/01/19 0510  NA 131* 133*  K 4.6 4.1  CL 98 100  CO2 19* 21*  GLUCOSE 139* 81  BUN 94* 96*  CREATININE 4.00* 3.61*  CALCIUM 8.4* 8.3*  AST 26  --   ALT 17  --   ALKPHOS 122  --   BILITOT 1.7*  --    Cardiac Enzymes No results for input(s): TROPONINI in the last 168 hours. RADIOLOGY:  Dg Chest 2 View  Result Date: 02/28/2019 CLINICAL DATA:  Short of breath EXAM: CHEST - 2 VIEW COMPARISON:  02/01/2019 FINDINGS: Marked cardiomegaly. Vascular congestion with interstitial edema has progressed from the prior study. No significant pleural effusion. IMPRESSION: Congestive heart failure with mild pulmonary edema. Electronically Signed   By: Franchot Gallo M.D.   On: 02/28/2019 17:48   ASSESSMENT AND PLAN:   Meghan [name]  is a 68 y.o. Caucasian female with  a known history of stage IIIb chronic kidney disease, coronary artery disease, diabetes mellitus and systolic CHF with a EF of 25 to 30% as of 12/14/2018 with severe left atrial and moderate respiratory location moderate tricuspid regurgitation, who presented to emergency room with abnormal blood work that was ordered by her cardiologist this morning and showed elevated BUN and creatinine  above previous levels.   1. 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 -Nephrology consultation with Dr. Theador Hawthorne -start IV normal saline at 75 mL an hour. Monitor for respiratory distress. -Daily weight, input output -daily metabolic panel -admission creat 4.0--3.61  2. Chronic diastolic/systolic heart failure -EF is 25 to 30% -holding torsemide -appreciate Dr.Gollan's input -currently on metoprolol -holding digoxin and losartan given elevated creatinine -patient does not seem to be in heart failure at present  3. Flutter a fib -has had cardioversion in the past -resume back on eliquis-- cardiology plans on cardioversion once patient is on adequate days of eliquis  4. History of G.I. bleed -patient was here in October 2020. Unable to do G.I. procedure during due to her cardiac and respiratory issues -no bleeding. Hemoglobin stable at 9.5 -continue PPI  5. Type II diabetes with diabetic nephropathy -hold PO Glucotrol -sliding scale insulin  6. Hyperlipidemia -continue Lipitor  Family communication : with daughter in the room Consults : rheology Dr. Rockey Situ, nephrology Discharge Disposition : home CODE STATUS: full code DVT Prophylaxis : eliquis  TOTAL TIME TAKING CARE OF THIS PATIENT: **35* minutes.  >50% time spent on counselling and coordination of care  POSSIBLE D/C IN **2 to 3* DAYS, DEPENDING ON CLINICAL CONDITION.  Note: This dictation was prepared with Dragon dictation along with smaller phrase technology. Any transcriptional errors that result from this process are unintentional.  Fritzi Mandes M.D on 03/01/2019 at 2:13 PM  Between 7am to 6pm - Pager - [phone]  After 6pm go to [email or web address]  [facility]   CC: Primary care physician; [name], MDPatient ID: Meghan [name], female   DOB: Dec 30, 1950, 68 y.o.   MRN:  [id number]

## 2019-03-01 NOTE — Plan of Care (Signed)
  Problem: Education: Goal: Knowledge of General Education information will improve Description: Including pain rating scale, medication(s)/side effects and non-pharmacologic comfort measures Outcome: Progressing   Problem: Pain Managment: Goal: General experience of comfort will improve Outcome: Progressing   Problem: Safety: Goal: Ability to remain free from injury will improve Outcome: Progressing   

## 2019-03-01 NOTE — Progress Notes (Signed)
Order placed for lasix gtt, however patients BP has been running soft (95/47--103/73). Patient has also c/o left knee pain from a fall sustained in the ED. NP Ouma notified. Per NP,  admitting MD did not find it necessary to get imaging and to start lasix gtt. Will continue to monitor.  Iran Sizer M

## 2019-03-01 NOTE — Consult Note (Signed)
Meghan [name] MRN: [id number] DOB/AGE: Aug 22, 1950 68 y.o. Primary Care Physician:Jones, Iven Finn, MD Admit date: 02/28/2019 Chief Complaint:  Chief Complaint  Patient presents with  . Abnormal Lab  . Weakness   HPI: Patient is a 68 year old Caucasian female with a with a past medical history of chronic kidney disease 3B, Coronary artery disease, Diabetes mellitus and systolic CHF with a EF of 25 to 30% who presented to emergency room with chief complaint of abnormal blood work .Marland Kitchen  History of present illness dates back to past few days  when patient was having complains of malaise, fatigue and nausea.  Patient daughter correlated this with the initiating of digoxin.  Patient digoxin was later held.  Patient labs were repeated patient was found to have acute kidney injury and patient was asked to come to the ER . Patient also complained of recent dyspnea with associated dyspnea on exertion without paroxysmal nocturnal dyspnea or orthopnea.  She has lower extremity edema which has not significantly worsened.   She has been having diminished urine output lately.   She denied any cough or hemoptysis.   No dysuria or hematuria or flank pain.   No recent exposure to COVID-19.    Upon evaluation in the ER patient was  feeling significantly weak and dizzy in the ER that she fell while going to the bathroom with the nurse and was held to the floor with no syncope or injuries.   Patient had labs done  which showed BUN of 94 and creatinine of 4 compared to 98 and 3.96 patient also had chest x-ray done which showed ongestive heart failure with mild pulmonary edema.  Patient patient was admitted for further treatment and was started on IV diuretics. Patient is currently on IV fluids.  Patient daughter who is very involved in her care gives most of the history   Past Medical History:  Diagnosis Date  . Carotid arterial disease ([place])    a. 02/2012 s/p L CEA.  . CKD stage G3b/A1, GFR 30-44 and albumin  creatinine ratio <30 mg/g   . Coronary artery disease    a. 02/2012 Lexi MV: Anterior, anteroseptal, septal, apical infarct with mild peri-infarct ischemia, EF 41%- medically managed due to anemia; b. 03/2017 Cath: LM nl, LAD 99ost/p/m - faint L->L collaterals, RI 20ost, LCX 43m, RCA 20p, 59m-->Med Rx for chronic subtotal LAD dzs. Avoid R Radial access in future.  . Diabetes mellitus without complication ([place])    Type II  . HFrEF (heart failure with reduced ejection fraction) ([place])    a. 06/2013 Echo: EF 35-40%; b. 07/2016 Echo: EF 25-30%; c. 11/2016 Echo: EF 20-25%; d. 12/2018 Echo: EF 25-30%, diff HK; e. 12/2018 Echo: EF 25-30%.  . Hyperlipidemia   . Hypertension   . Ischemic cardiomyopathy    a. 06/2013 Echo: EF 35-40%, glob HK; b. 07/2016 Echo: EF 25-30%, sev mid-apicalanteroseptal, ant, apical HK; c. 11/2016 Echo: EF 20-25%, antsept DK, Gr2 DD; d. 12/2018 Echo: EF 25-30%, diff HK.  . Morbid obesity ([place])   . Normocytic anemia   . Persistent atrial fibrillation ([facility])    a. CHA2DS2VASc = 7-->eliquis  . Stroke [facility]) 2013        Family History  Problem Relation Age of Onset  . Heart disease Mother   . Diabetes Mother   . Heart disease Father   . Hypertension Father   . Diabetes Maternal Grandmother     Social History:  reports that she has quit smoking. Her smoking use included  cigarettes. She has a 2.50 pack-year smoking history. She has never used smokeless tobacco. She reports that she does not drink alcohol or use drugs.   Allergies:  Allergies  Allergen Reactions  . Spironolactone     Hyperkalemia     Medications Prior to Admission  Medication Sig Dispense Refill  . apixaban (ELIQUIS) 5 MG TABS tablet Take 1 tablet (5 mg total) by mouth 2 (two) times daily. 60 tablet 6  . atorvastatin (LIPITOR) 40 MG tablet Take 1 tablet (40 mg total) by mouth daily. 90 tablet 2  . glipiZIDE (GLUCOTROL) 10 MG tablet Take 10 mg by mouth 2 (two) times daily. Dr Honor Junes    . Insulin  Degludec 200 UNIT/ML SOPN Inject 25 Units into the skin daily at 12 noon. Dr Honor Junes (1300)    . levothyroxine (SYNTHROID) 50 MCG tablet Take 50 mcg by mouth daily before breakfast.    . losartan (COZAAR) 25 MG tablet Take 0.5 tablets (12.5 mg total) by mouth daily. 15 tablet 6  . metoprolol tartrate (LOPRESSOR) 25 MG tablet Take 1 tablet (25 mg total) by mouth 2 (two) times daily. 60 tablet 6  . ondansetron (ZOFRAN) 4 MG tablet Take 1 tablet (4 mg total) by mouth every 8 (eight) hours as needed for nausea or vomiting. 20 tablet 2  . pantoprazole (PROTONIX) 40 MG tablet Take 1 tablet (40 mg total) by mouth daily. 30 tablet 0  . torsemide (DEMADEX) 20 MG tablet Take 2 tablets (40 mg total) by mouth 2 (two) times daily. 120 tablet 4  . triamcinolone cream (KENALOG) 0.5 % Apply 1 application topically 2 (two) times daily as needed (psoriasis).          [id number] from the symptoms mentioned above,there are no other symptoms referable to all systems reviewed.  Marland Kitchen apixaban  5 mg Oral BID  . aspirin EC  81 mg Oral Daily  . atorvastatin  40 mg Oral Daily  . insulin aspart  0-15 Units Subcutaneous TID WC  . insulin aspart  0-5 Units Subcutaneous QHS  . levothyroxine  50 mcg Oral QAC breakfast  . metoprolol tartrate  25 mg Oral BID  . pantoprazole  40 mg Oral Daily  . sodium chloride flush  3 mL Intravenous Q12H      Physical Exam: Vital signs in last 24 hours: Temp:  [97.5 F (36.4 C)-98.7 F (37.1 C)] 97.8 F (36.6 C) (11/21 0725) Pulse Rate:  [59-95] 61 (11/21 0725) Resp:  [13-22] 19 (11/21 0725) BP: (95-109)/(47-73) 102/61 (11/21 0725) SpO2:  [92 %-98 %] 94 % (11/21 0725) Weight:  [113.4 kg-118.8 kg] 118 kg (11/21 0459) Weight change:     Intake/Output from previous day: 11/20 0701 - 11/21 0700 In: 29.9 [I.V.:29.9] Out: 420 [Urine:420] No intake/output data recorded.   Physical Exam: General- pt is awake,alert, oriented to time place and person Resp- No acute REsp  distress, minimal Rhonchi CVS- S1S2 regular in rate and rhythm GIT- BS+, soft, NT, ND EXT- 1+ LE Edema(as per patient's daughter this month swelling is much better than before), Cyanosis CNS- CN 2-12 grossly intact. Moving all 4 extremities Psych- normal mood and affect    Lab Results: CBC Recent Labs    02/28/19 1630  WBC 6.8  HGB 9.5*  HCT 30.0*  PLT 281    BMET Recent Labs    02/28/19 1630 03/01/19 0510  NA 131* 133*  K 4.6 4.1  CL 98 100  CO2 19* 21*  GLUCOSE 139* 81  BUN 94* 96*  CREATININE 4.00* 3.61*  CALCIUM 8.4* 8.3*    Creatinine trend During this admission creatinine increased from baseline of 2.0 ==>4.0 and now 3.6 2020 1.7--4.0 Multiple admissions in 2020 with acute kidney injury 2019 1.2--1.6 2018 1.0--1.8 Multiple admissions in 2020  MICRO Recent Results (from the past 240 hour(s))  SARS CORONAVIRUS 2 (TAT 6-24 HRS) Nasopharyngeal Nasopharyngeal Swab     Status: None   Collection Time: 02/28/19  9:33 PM   Specimen: Nasopharyngeal Swab  Result Value Ref Range Status   SARS Coronavirus 2 NEGATIVE NEGATIVE Final    Comment: (NOTE) SARS-CoV-2 target nucleic acids are NOT DETECTED. The SARS-CoV-2 RNA is generally detectable in upper and lower respiratory specimens during the acute phase of infection. Negative results do not preclude SARS-CoV-2 infection, do not rule out co-infections with other pathogens, and should not be used as the sole basis for treatment or other patient management decisions. Negative results must be combined with clinical observations, patient history, and epidemiological information. The expected result is Negative. Fact Sheet for Patients: SugarRoll.be Fact Sheet for Healthcare Providers: https://www.woods-mathews.com/ This test is not yet approved or cleared by the [place] FDA and  has been authorized for detection and/or diagnosis of SARS-CoV-2 by FDA under an Emergency  Use Authorization (EUA). This EUA will remain  in effect (meaning this test can be used) for the duration of the COVID-19 declaration under Section 56 4(b)(1) of the Act, 21 U.S.C. section 360bbb-3(b)(1), unless the authorization is terminated or revoked sooner. Performed at [facility], [place] [address], [place], [place] [address]       Lab Results  Component Value Date   CALCIUM 8.3 (L) 03/01/2019   PHOS 3.5 02/04/2019      Impression: 1)Renal   AKI secondary to cardiorenal syndrome versus intravascular volume depletion Patient data reviewed from February 21 2019 shows that patient was having decreased p.o. intake from past week or so.  Patient dry weight is lower than what  she was discharged with. Upon review of the chest x-ray it does not show much fluid overload when compared to previous x-rays. Patient has AKI most likely from intravascular volume depletion with ARB on board Patient has  AKI on CKD Patient has CKD stage 4. CKD since 2018 CKD secondary to diabetes mellitus  Progression of CKD marked with history of multiple AKI Patient AKI is now minimally better as patient creatinine is trending down   2)HTN Patient blood pressure is stable   3)Anemia of chronic disease  HGb at goal (9--11) Will initiate anemia work-up  4)CKD Mineral-Bone Disorder PTH not avail Secondary Hyperparathyroidism w/u pending Phosphorus at goal.   5) Chronic systolic congestive heart failure Pt was on IV Diuretics Patient is is currently well compensated Patient is being followed closely by the cardiologist and the primary team  6) hyponatremia Patient sodium is improving  7)Acid base Co2 at goal  9) atrial fibrillation Patient is on metoprolol and Eliquis   Plan:  Agree with holding ARB Agree with changing IV diuresis to IV hydration Agree with current gentle IV hydration We will follow patient's Chem-7    Deedee Lybarger s [facility] 03/01/2019, 10:54 AM

## 2019-03-01 NOTE — Consult Note (Signed)
Cardiology Consultation:   Patient ID: Meghan [name] MRN: [id number]; DOB: 07-24-1950  Admit date: 02/28/2019 Date of Consult: 03/01/2019  Primary Care Provider: [name], MD Primary Cardiologist: Kathlyn Sacramento, MD  Physician requesting consult: Dr. Fritzi Mandes Reason for consult pulmonary edema on x-ray   Patient Profile:   Meghan [name] is a 68 y.o. female with a hx of ischemic cardiomyopathy with ejection fraction 25 to 30%, atrial flutter/fibrillation, anemia, recent prolonged hospital course for acute on chronic diastolic and systolic CHF requiring milrinone and aggressive diuresis, presenting to the hospital with weakness, chronic nausea vomiting since discharge, weight loss   History of Present Illness:   Ms. [name] presented with her daughter .Reports weight has been at or below her dry weight since discharge several weeks ago Weight was 255 pounds when she left the hospital at home, Weight yesterday 250 pounds She has not been eating drinking well recently, unable to keep anything down. Lab work done as outpatient showed climbing BUN/creatinine and torsemide was decreased from 40 twice daily down to 20 twice daily.  Despite this she continued to feel weak  In the emergency room had difficulty standing, nurses encouraged her to walk to the bathroom to urinate and she fell twice injuring her left knee.  Nurses were unable to support her She is complaining of significant knee pain this morning  Daughter at the bedside does not feel she has extra fluid on board She has no leg swelling, no abdominal distention, no significant shortness of breath  X-ray was done in the emergency room suggesting pulmonary edema and she was started on Lasix infusion in the emergency room   Heart Pathway Score:     Past Medical History:  Diagnosis Date  . Carotid arterial disease ([place])    a. 02/2012 s/p L CEA.  . CKD stage G3b/A1, GFR 30-44 and albumin creatinine ratio <30 mg/g   . Coronary  artery disease    a. 02/2012 Lexi MV: Anterior, anteroseptal, septal, apical infarct with mild peri-infarct ischemia, EF 41%- medically managed due to anemia; b. 03/2017 Cath: LM nl, LAD 99ost/p/m - faint L->L collaterals, RI 20ost, LCX 52m, RCA 20p, 76m-->Med Rx for chronic subtotal LAD dzs. Avoid R Radial access in future.  . Diabetes mellitus without complication ([place])    Type II  . HFrEF (heart failure with reduced ejection fraction) ([place])    a. 06/2013 Echo: EF 35-40%; b. 07/2016 Echo: EF 25-30%; c. 11/2016 Echo: EF 20-25%; d. 12/2018 Echo: EF 25-30%, diff HK; e. 12/2018 Echo: EF 25-30%.  . Hyperlipidemia   . Hypertension   . Ischemic cardiomyopathy    a. 06/2013 Echo: EF 35-40%, glob HK; b. 07/2016 Echo: EF 25-30%, sev mid-apicalanteroseptal, ant, apical HK; c. 11/2016 Echo: EF 20-25%, antsept DK, Gr2 DD; d. 12/2018 Echo: EF 25-30%, diff HK.  . Morbid obesity ([place])   . Normocytic anemia   . Persistent atrial fibrillation ([facility])    a. CHA2DS2VASc = 7-->eliquis  . Stroke [facility]) 2013    Past Surgical History:  Procedure Laterality Date  . AMPUTATION TOE Left 09/22/2016   Procedure: AMPUTATION TOE;  Surgeon: Albertine Patricia, DPM;  Location: [facility] ORS;  Service: Podiatry;  Laterality: Left;  . CARDIOVERSION N/A 07/28/2016   Procedure: CARDIOVERSION;  Surgeon: Wellington Hampshire, MD;  Location: [facility] ORS;  Service: Cardiovascular;  Laterality: N/A;  . CARDIOVERSION N/A 08/07/2016   Procedure: CARDIOVERSION;  Surgeon: Wellington Hampshire, MD;  Location: [facility] ORS;  Service: Cardiovascular;  Laterality: N/A;  .  CARDIOVERSION N/A 01/03/2019   Procedure: CARDIOVERSION;  Surgeon: Minna Merritts, MD;  Location: [facility] ORS;  Service: Cardiovascular;  Laterality: N/A;  . CAROTID ENDARTERECTOMY     [facility]; Dr. Lucky Cowboy  . CHOLECYSTECTOMY    . ESOPHAGOGASTRODUODENOSCOPY (EGD) WITH PROPOFOL N/A 01/25/2019   Procedure: ESOPHAGOGASTRODUODENOSCOPY (EGD) WITH PROPOFOL;  Surgeon: Virgel Manifold, MD;  Location: [facility]  ENDOSCOPY;  Service: Endoscopy;  Laterality: N/A;  . foot infection Right   . LEG SURGERY     right;infection  . RIGHT/LEFT HEART CATH AND CORONARY ANGIOGRAPHY Bilateral 03/12/2017   Procedure: RIGHT/LEFT HEART CATH AND CORONARY ANGIOGRAPHY;  Surgeon: Wellington Hampshire, MD;  Location: [place] CV LAB;  Service: Cardiovascular;  Laterality: Bilateral;  . TONSILLECTOMY       Home Medications:  Prior to Admission medications   Medication Sig Start Date End Date Taking? Authorizing Provider  apixaban (ELIQUIS) 5 MG TABS tablet Take 1 tablet (5 mg total) by mouth 2 (two) times daily. 02/20/19   Theora Gianotti, NP  atorvastatin (LIPITOR) 40 MG tablet Take 1 tablet (40 mg total) by mouth daily. 02/20/19   Theora Gianotti, NP  glipiZIDE (GLUCOTROL) 10 MG tablet Take 10 mg by mouth 2 (two) times daily. Dr Honor Junes 08/13/15   [provider]  Insulin Degludec 200 UNIT/ML SOPN Inject 25 Units into the skin daily at 12 noon. Dr Honor Junes (1300) 02/18/18   [provider]  levothyroxine (SYNTHROID) 50 MCG tablet Take 50 mcg by mouth daily before breakfast.    [provider]  losartan (COZAAR) 25 MG tablet Take 0.5 tablets (12.5 mg total) by mouth daily. 02/20/19   Theora Gianotti, NP  metoprolol tartrate (LOPRESSOR) 25 MG tablet Take 1 tablet (25 mg total) by mouth 2 (two) times daily. 02/20/19   Theora Gianotti, NP  ondansetron (ZOFRAN) 4 MG tablet Take 1 tablet (4 mg total) by mouth every 8 (eight) hours as needed for nausea or vomiting. 02/20/19   Theora Gianotti, NP  pantoprazole (PROTONIX) 40 MG tablet Take 1 tablet (40 mg total) by mouth daily. 02/08/19   Mayo, Pete Pelt, MD  torsemide (DEMADEX) 20 MG tablet Take 2 tablets (40 mg total) by mouth 2 (two) times daily. 01/09/19   Wellington Hampshire, MD  triamcinolone cream (KENALOG) 0.5 % Apply 1 application topically 2 (two) times daily as needed (psoriasis).     [provider]    Inpatient Medications: Scheduled Meds: . apixaban  5 mg Oral BID  . aspirin EC  81 mg Oral Daily  . atorvastatin  40 mg Oral Daily  . insulin aspart  0-15 Units Subcutaneous TID WC  . insulin aspart  0-5 Units Subcutaneous QHS  . levothyroxine  50 mcg Oral QAC breakfast  . metoprolol tartrate  25 mg Oral BID  . pantoprazole  40 mg Oral Daily  . sodium chloride flush  3 mL Intravenous Q12H   Continuous Infusions: . sodium chloride Stopped (03/01/19 1146)  . sodium chloride 75 mL/hr at 03/01/19 1313   PRN Meds: sodium chloride, acetaminophen, ALPRAZolam, ondansetron (ZOFRAN) IV, ondansetron, sodium chloride flush  Allergies:    Allergies  Allergen Reactions  . Spironolactone     Hyperkalemia     Social History:   Social History   Socioeconomic History  . Marital status: Widowed    Spouse name: Not on file  . Number of children: Not on file  . Years of education: Not on file  . Highest  education level: Not on file  Occupational History  . Not on file  Social Needs  . Financial resource strain: Not on file  . Food insecurity    Worry: Not on file    Inability: Not on file  . Transportation needs    Medical: Not on file    Non-medical: Not on file  Tobacco Use  . Smoking status: Former Smoker    Packs/day: 0.50    Years: 5.00    Pack years: 2.50    Types: Cigarettes  . Smokeless tobacco: Never Used  Substance and Sexual Activity  . Alcohol use: No  . Drug use: No  . Sexual activity: Not Currently  Lifestyle  . Physical activity    Days per week: 0 days    Minutes per session: Not on file  . Stress: Not on file  Relationships  . Social Herbalist on phone: Three times a week    Gets together: Not on file    Attends religious service: Not on file    Active member of club or organization: Not on file    Attends meetings of clubs or organizations: Not on file    Relationship status: Not on file  . Intimate partner violence     Fear of current or ex partner: Not on file    Emotionally abused: Not on file    Physically abused: Not on file    Forced sexual activity: Not on file  Other Topics Concern  . Not on file  Social History Narrative  . Not on file    Family History:    Family History  Problem Relation Age of Onset  . Heart disease Mother   . Diabetes Mother   . Heart disease Father   . Hypertension Father   . Diabetes Maternal Grandmother      ROS:  Please see the history of present illness.  Review of Systems  Constitutional: Positive for malaise/fatigue.  HENT: Negative.   Respiratory: Negative.   Cardiovascular: Negative.   Gastrointestinal: Negative.   Musculoskeletal: Positive for joint pain.  Neurological: Negative.   Psychiatric/Behavioral: Negative.   All other systems reviewed and are negative.   Physical Exam/Data:   Vitals:   03/01/19 0017 03/01/19 0448 03/01/19 0459 03/01/19 0725  BP: 103/73 104/73  102/61  Pulse: 85 86  61  Resp:    19  Temp:  98.3 F (36.8 C)  97.8 F (36.6 C)  TempSrc:  Oral  Oral  SpO2: 96% 92%  94%  Weight:  118 kg 118 kg   Height:        Intake/Output Summary (Last 24 hours) at 03/01/2019 1448 Last data filed at 03/01/2019 1313 Gross per 24 hour  Intake 134.09 ml  Output 920 ml  Net -785.91 ml   Last 3 Weights 03/01/2019 03/01/2019 03/01/2019  Weight (lbs) 260 lb 2.3 oz 260 lb 2.3 oz 261 lb 14.4 oz  Weight (kg) 118 kg 118 kg 118.797 kg     Body mass index is 40.74 kg/m.  General:  Well nourished, well developed, in no acute distress Able to lay supine without distress HEENT: normal Lymph: no adenopathy Neck: no JVD Endocrine:  No thryomegaly Vascular: No carotid bruits; FA pulses 2+ bilaterally without bruits  Cardiac:  normal S1, S2; RRR; no murmur  Lungs:  clear to auscultation bilaterally, no wheezing, rhonchi or rales  Abd: soft, nontender, no hepatomegaly  Ext: no edema Musculoskeletal:  No deformities, BUE and  BLE  strength normal and equal.  Left knee tender to palpation, ecchymosis noted Skin: warm and dry  Neuro:  CNs 2-12 intact, no focal abnormalities noted Psych:  Normal affect   EKG:  The EKG was personally reviewed and demonstrates:   Atrial fibrillation ventricular rate 74 bpm poor R wave progression to the anterior precordial leads, nonspecific ST-T wave abnormality 1 and aVL consistent with anterolateral ischemia  Telemetry:  Telemetry was personally reviewed and demonstrates:   Atrial fibrillation  Relevant CV Studies:   Laboratory Data:  High Sensitivity Troponin:   Recent Labs  Lab 02/28/19 1630 02/28/19 2133  TROPONINIHS 9 10     Chemistry Recent Labs  Lab 02/28/19 1133 02/28/19 1630 03/01/19 0510  NA 130* 131* 133*  K 4.1 4.6 4.1  CL 95* 98 100  CO2 20* 19* 21*  GLUCOSE 75 139* 81  BUN 98* 94* 96*  CREATININE 3.96* 4.00* 3.61*  CALCIUM 8.1* 8.4* 8.3*  GFRNONAA 11* 11* 12*  GFRAA 13* 12* 14*  ANIONGAP 15 14 12     Recent Labs  Lab 02/28/19 1133 02/28/19 1630  PROT 8.4* 8.5*  ALBUMIN 2.8* 2.8*  AST 27 26  ALT 17 17  ALKPHOS 111 122  BILITOT 1.6* 1.7*   Hematology Recent Labs  Lab 02/28/19 1630  WBC 6.8  RBC 3.72*  HGB 9.5*  HCT 30.0*  MCV 80.6  MCH 25.5*  MCHC 31.7  RDW 21.5*  PLT 281   BNPNo results for input(s): BNP, PROBNP in the last 168 hours.  DDimer No results for input(s): DDIMER in the last 168 hours.   Radiology/Studies:  Dg Chest 2 View  Result Date: 02/28/2019 CLINICAL DATA:  Short of breath EXAM: CHEST - 2 VIEW COMPARISON:  02/01/2019 FINDINGS: Marked cardiomegaly. Vascular congestion with interstitial edema has progressed from the prior study. No significant pleural effusion. IMPRESSION: Congestive heart failure with mild pulmonary edema. Electronically Signed   By: Franchot Gallo M.D.   On: 02/28/2019 17:48    Assessment and Plan:   1.  Acute on chronic renal failure Appears to be prerenal, weight 5 pounds below her dry  weight at time of discharge several weeks ago -Would recommend we hold Lasix infusion, start IV fluids at low rate  2.  Chronic nausea vomiting Daughter reports this presented at the tail end of her last hospitalization and has persisted as outpatient, getting worse --Possibly from uremia as BUN 96 Unable to exclude gastroparesis IV fluids as above  3.  Ischemic cardiomyopathy Ejection fraction 25 to 30% Would hold losartan, torsemide in the setting of renal failure No plan for ischemic work-up at this time  4.  Atrial fibrillation Would continue metoprolol for rate control and Eliquis 5 mg twice daily  5.  Diabetes Will defer to medical service Appears she is on insulin, glipizide as outpatient   Case discussed with hospitalist service, patient and daughter at bedside  Total encounter time more than 110 minutes  Greater than 50% was spent in counseling and coordination of care with the patient   For questions or updates, please contact [place] Please consult [email or web address] for contact info under     Signed, Ida Rogue, MD  03/01/2019 2:48 PM

## 2019-03-01 NOTE — Progress Notes (Addendum)
Blood sugar at 149 as per tech. Glucometer not sinking yet. Will continue to monitor.  Update 2120: Pt BP at 105/48 HR 69. Pt have a schedule metropolol 25 mg at 2200. Notify [facility]. Will continue to monitor.  Update 0023: pt still complaining of knee pain after tylenol administration. Notify prime. Will continue to monitor.  Update 0025: Ouma NP ordered oxycodone-acetaminophen 5-325 mg 1 tablet oral every 6 PRN for moderate pain. Will continue to monitor.

## 2019-03-01 NOTE — Progress Notes (Deleted)
Blood sugar at 149 as per tech. Glucometer not sinking yet. Will continue to monitor.

## 2019-03-02 ENCOUNTER — Inpatient Hospital Stay: Payer: Medicare Other

## 2019-03-02 DIAGNOSIS — I255 Ischemic cardiomyopathy: Secondary | ICD-10-CM

## 2019-03-02 DIAGNOSIS — N184 Chronic kidney disease, stage 4 (severe): Secondary | ICD-10-CM

## 2019-03-02 DIAGNOSIS — R531 Weakness: Secondary | ICD-10-CM

## 2019-03-02 DIAGNOSIS — I25118 Atherosclerotic heart disease of native coronary artery with other forms of angina pectoris: Secondary | ICD-10-CM

## 2019-03-02 DIAGNOSIS — N189 Chronic kidney disease, unspecified: Secondary | ICD-10-CM

## 2019-03-02 DIAGNOSIS — E1122 Type 2 diabetes mellitus with diabetic chronic kidney disease: Secondary | ICD-10-CM

## 2019-03-02 DIAGNOSIS — W19XXXD Unspecified fall, subsequent encounter: Secondary | ICD-10-CM

## 2019-03-02 DIAGNOSIS — N179 Acute kidney failure, unspecified: Secondary | ICD-10-CM

## 2019-03-02 LAB — BASIC METABOLIC PANEL
Anion gap: 11 (ref 5–15)
BUN: 96 mg/dL — ABNORMAL HIGH (ref 8–23)
CO2: 22 mmol/L (ref 22–32)
Calcium: 8.1 mg/dL — ABNORMAL LOW (ref 8.9–10.3)
Chloride: 98 mmol/L (ref 98–111)
Creatinine, Ser: 3.35 mg/dL — ABNORMAL HIGH (ref 0.44–1.00)
GFR calc Af Amer: 15 mL/min — ABNORMAL LOW (ref >60)
GFR calc non Af Amer: 13 mL/min — ABNORMAL LOW (ref >60)
Glucose, Bld: 144 mg/dL — ABNORMAL HIGH (ref 70–99)
Potassium: 4.4 mmol/L (ref 3.5–5.1)
Sodium: 131 mmol/L — ABNORMAL LOW (ref 135–145)

## 2019-03-02 MED ORDER — OXYCODONE-ACETAMINOPHEN 5-325 MG PO TABS
1.0000 | ORAL_TABLET | Freq: Four times a day (QID) | ORAL | Status: DC | PRN
  Administered 2019-03-02 – 2019-03-04 (×5): 1 via ORAL
  Filled 2019-03-02 (×5): qty 1

## 2019-03-02 NOTE — Progress Notes (Signed)
Progress Note  Patient Name: Meghan [name] Date of Encounter: 03/02/2019  Primary Cardiologist: Kathlyn Sacramento, MD   Subjective   Reports that she feels somewhat better today Still with significant left knee pain, unable to bend it Still swollen, has been icing on and off past 24 hours Fell twice in the emergency room landing on her left knee " It took 5 people to get me up, I told them I was weak"  Reports her breathing is at her baseline, able to lay supine  Inpatient Medications    Scheduled Meds: . apixaban  5 mg Oral BID  . aspirin EC  81 mg Oral Daily  . atorvastatin  40 mg Oral Daily  . insulin aspart  0-15 Units Subcutaneous TID WC  . insulin aspart  0-5 Units Subcutaneous QHS  . levothyroxine  50 mcg Oral QAC breakfast  . metoprolol tartrate  25 mg Oral BID  . pantoprazole  40 mg Oral Daily  . sodium chloride flush  3 mL Intravenous Q12H   Continuous Infusions: . sodium chloride Stopped (03/01/19 1146)   PRN Meds: sodium chloride, acetaminophen, ALPRAZolam, ondansetron (ZOFRAN) IV, ondansetron, oxyCODONE-acetaminophen, sodium chloride flush   Vital Signs    Vitals:   03/02/19 0455 03/02/19 0555 03/02/19 0814 03/02/19 0958  BP: (!) 101/48  (!) 90/53   Pulse: (!) 53  (!) 45 78  Resp: 19  18   Temp: 97.9 F (36.6 C)  98.7 F (37.1 C)   TempSrc: Oral     SpO2: 92%  95%   Weight:  120.2 kg    Height:        Intake/Output Summary (Last 24 hours) at 03/02/2019 1221 Last data filed at 03/02/2019 0900 Gross per 24 hour  Intake 1543.96 ml  Output 1300 ml  Net 243.96 ml   Last 3 Weights 03/02/2019 03/01/2019 03/01/2019  Weight (lbs) 264 lb 15.9 oz 260 lb 2.3 oz 260 lb 2.3 oz  Weight (kg) 120.2 kg 118 kg 118 kg      Telemetry  Atrial fibrillation- Personally Reviewed  ECG     - Personally Reviewed  Physical Exam   GEN: No acute distress.   Neck:  Unable to estimate JVD Cardiac:  Irregularly irregular, no murmurs, rubs, or gallops.   Respiratory: Clear to auscultation bilaterally, scattered Rales GI: Soft, nontender, non-distended  MS: No edema; No deformity. Neuro:  Nonfocal  Psych: Normal affect   Labs    High Sensitivity Troponin:   Recent Labs  Lab 02/28/19 1630 02/28/19 2133  TROPONINIHS 9 10      Chemistry Recent Labs  Lab 02/28/19 1133 02/28/19 1630 03/01/19 0510 03/02/19 0549  NA 130* 131* 133* 131*  K 4.1 4.6 4.1 4.4  CL 95* 98 100 98  CO2 20* 19* 21* 22  GLUCOSE 75 139* 81 144*  BUN 98* 94* 96* 96*  CREATININE 3.96* 4.00* 3.61* 3.35*  CALCIUM 8.1* 8.4* 8.3* 8.1*  PROT 8.4* 8.5*  --   --   ALBUMIN 2.8* 2.8*  --   --   AST 27 26  --   --   ALT 17 17  --   --   ALKPHOS 111 122  --   --   BILITOT 1.6* 1.7*  --   --   GFRNONAA 11* 11* 12* 13*  GFRAA 13* 12* 14* 15*  ANIONGAP 15 14 12 11      Hematology Recent Labs  Lab 02/28/19 1630  WBC 6.8  RBC 3.72*  HGB 9.5*  HCT 30.0*  MCV 80.6  MCH 25.5*  MCHC 31.7  RDW 21.5*  PLT 281    BNPNo results for input(s): BNP, PROBNP in the last 168 hours.   DDimer No results for input(s): DDIMER in the last 168 hours.   Radiology    Dg Chest 2 View  Result Date: 02/28/2019 CLINICAL DATA:  Short of breath EXAM: CHEST - 2 VIEW COMPARISON:  02/01/2019 FINDINGS: Marked cardiomegaly. Vascular congestion with interstitial edema has progressed from the prior study. No significant pleural effusion. IMPRESSION: Congestive heart failure with mild pulmonary edema. Electronically Signed   By: Franchot Gallo M.D.   On: 02/28/2019 17:48    Cardiac Studies     Patient Profile     Meghan [name] is a 68 y.o. female with a hx of ischemic cardiomyopathy with ejection fraction 25 to 30%, atrial flutter/fibrillation, anemia, recent prolonged hospital course for acute on chronic diastolic and systolic CHF requiring milrinone and aggressive diuresis, presenting to the hospital with weakness, chronic nausea vomiting since discharge, weight loss  Assessment  & Plan    1.  Acute on chronic renal failure Dehydration on arrival weight 5 pounds below her dry weight at time of discharge several weeks ago.  Last discharge 255 pounds, day before admission was 250 pounds, lab work showing elevated BUN/creatinine above her baseline, torsemide had been decreased but she continued to feel poorly Initially started on Lasix infusion on arrival, this was held Changed to IV fluids, overnight with improved creatinine still above her baseline --Baseline creatinine around 2 with BUN 67 at last discharge Peak of 4 on arrival, now down to 3.35 BUN still elevated 96 --- Recommend we hold the fluids tomorrow morning/Monday a.m. --At time of discharge will likely need torsemide 20 twice daily, down from previous discharge dose 40 twice daily -As outpatient hold torsemide for weight less than 255 pounds  2.  Chronic nausea vomiting Daughter reports this presented at the tail end of her last hospitalization and has persisted as outpatient, getting worse before hospital admission Lab work with uremia, BUN 96 Unable to exclude gastroparesis -Reports having better breakfast today  3.  Ischemic cardiomyopathy Ejection fraction 25 to 30% Would hold losartan, torsemide in the setting of renal failure No plan for ischemic work-up at this time  4.  Atrial fibrillation metoprolol for rate control and Eliquis 5 mg twice daily Rate stable  5.  Diabetes Will defer to medical service on insulin, glipizide as outpatient  6 left knee pain Reports that she fell twice in the emergency room landed on her left knee Took 5 people to get her up in the emergency room given she had profound weakness Continues to complain of left knee pain, x-rays pending She reports having difficulty weightbearing or even bending the knee  Complicated case, issues discussed with patient and daughter, and with hospitalist service  Total encounter time more than 35 minutes  Greater than 50%  was spent in counseling and coordination of care with the patient    For questions or updates, please contact [place] Please consult [email or web address] for contact info under        Signed, Ida Rogue, MD  03/02/2019, 12:21 PM

## 2019-03-02 NOTE — Progress Notes (Signed)
Initial Nutrition Assessment  DOCUMENTATION CODES:   Morbid obesity  INTERVENTION:  Plan is to liberalize diet to heart healthy/carbohydrate modified.  Patient reports if she is receiving food she likes she will be able to eat well. She reports she does not need an oral nutrition supplement at this time. Encouraged adequate intake.  NUTRITION DIAGNOSIS:   Inadequate oral intake related to other (see comment)(limited options on renal diet) as evidenced by per patient/family report.  GOAL:   Patient will meet greater than or equal to 90% of their needs  MONITOR:   PO intake, Labs, Weight trends, I & O's  REASON FOR ASSESSMENT:   Malnutrition Screening Tool    ASSESSMENT:   68 year old female with PMHx of HTN, DM, HLD, hx CVA, CAD, A-fib, ischemic cardiomyopathy, CKD admitted with AKI on CKD III.   Met with patient at bedside. She reports she had a decreased appetite and intake related to N/V for a few days PTA. She reports the nausea has completely resolved now and her appetite is back to normal. However, she is on a very restrictive renal diet and is not able to get things she enjoys from the menu. That is why she only ate 50% of her dinner last night. She could only get fish and rice and she does not like either of these. She reports if she could get the foods she typically orders here she would be able to eat very well. She reports she does not want an oral nutrition supplement at this time.   Patient reports she is unsure of her UBW but reports that she lost about 5 lbs recently. She reports it is difficult to trend her weight due to fluctuations from fluid. According to cardiology note patient's previous dry weight was 255 lbs and she had lost down to 250 lbs. On admission patient was 118.8 kg (261.9 lbs) and is now 120.2 kg (264.99 lbs).  Medications reviewed and include: Eliquis, Novolog 0-15 units TID, Novolog 0-5 units QHS, levothyroxine, pantoprazole.  Labs reviewed: CBG  144-180, Sodium 131, BUN 96, Creatinine 3.35.  Patient does not meet criteria for malnutrition at this time.  NUTRITION - FOCUSED PHYSICAL EXAM:    Most Recent Value  Orbital Region  No depletion  Upper Arm Region  No depletion  Thoracic and Lumbar Region  No depletion  Buccal Region  No depletion  Temple Region  No depletion  Clavicle Bone Region  No depletion  Clavicle and Acromion Bone Region  No depletion  Scapular Bone Region  No depletion  Dorsal Hand  No depletion  Patellar Region  No depletion  Anterior Thigh Region  No depletion  Posterior Calf Region  Unable to assess  Edema (RD Assessment)  Mild  Hair  Reviewed  Eyes  Reviewed  Mouth  Reviewed  Skin  Reviewed  Nails  Reviewed     Diet Order:   Diet Order            Diet heart healthy/carb modified Room service appropriate? Yes; Fluid consistency: Thin  Diet effective now             EDUCATION NEEDS:   No education needs have been identified at this time  Skin:  Skin Assessment: Skin Integrity Issues:(MSAD to abdomen, breast, buttocks, groin)  Last BM:  Unknown  Height:   Ht Readings from Last 1 Encounters:  03/01/19 5' 7" (1.702 m)   Weight:   Wt Readings from Last 1 Encounters:  03/02/19 120.2  kg   Ideal Body Weight:  61.4 kg  BMI:  Body mass index is 41.5 kg/m.  Estimated Nutritional Needs:   Kcal:  2200-2500  Protein:  110-125 grams  Fluid:  1.5-1.8 L/day  Leanne King, MS, RD, LDN Office: [phone] Pager: [phone] After Hours/Weekend Pager: [phone] 

## 2019-03-02 NOTE — Progress Notes (Signed)
[facility] NAME: Meghan [name]    MR#:  [id number]  DATE OF BIRTH:  [birth year]  SUBJECTIVE:   Patient came in with increasing fatigue ability weakness and abnormal elevated creatinine.   Has knee pain after she had fallen the ER without any trauma  Feels well rested today. Good urine output appetite okay REVIEW OF SYSTEMS:   Review of Systems  Constitutional: Negative for chills, fever and weight loss.  HENT: Negative for ear discharge, ear pain and nosebleeds.   Eyes: Negative for blurred vision, pain and discharge.  Respiratory: Positive for shortness of breath. Negative for sputum production, wheezing and stridor.   Cardiovascular: Negative for chest pain, palpitations, orthopnea and PND.  Gastrointestinal: Negative for abdominal pain, diarrhea, nausea and vomiting.  Genitourinary: Negative for frequency and urgency.  Musculoskeletal: Positive for falls and joint pain. Negative for back pain.  Neurological: Positive for weakness. Negative for sensory change, speech change and focal weakness.  Psychiatric/Behavioral: Negative for depression and hallucinations. The patient is not nervous/anxious.    Tolerating Diet:yes Tolerating PT: pending  at baseline patient gets out of bed to chair and bedside commode. Does not ambulate much at home.  DRUG ALLERGIES:   Allergies  Allergen Reactions  . Spironolactone     Hyperkalemia     VITALS:  Blood pressure (!) 90/53, pulse 78, temperature 98.7 F (37.1 C), resp. rate 18, height 5\' 7"  (1.702 m), weight 120.2 kg, SpO2 95 %.  PHYSICAL EXAMINATION:   Physical Exam  GENERAL:  68 y.o.-year-old patient lying in the bed with no acute distress. Morbidly obese EYES: Pupils equal, round, reactive to light and accommodation. No scleral icterus. HEENT: Head atraumatic, normocephalic. Oropharynx and nasopharynx clear. Oral mucosa dry NECK:  Supple, no jugular venous distention. No  thyroid enlargement, no tenderness.  LUNGS: Normal shallow breath sounds bilaterally, no wheezing, rales, rhonchi. No use of accessory muscles of respiration.  CARDIOVASCULAR: S1, S2 normal. No murmurs, rubs, or gallops.  ABDOMEN: Soft, nontender, nondistended. Bowel sounds present. No organomegaly or mass.  EXTREMITIES: No cyanosis, clubbing  ++ chronic edema b/l.    NEUROLOGIC: Cranial nerves II through XII are intact. No focal Motor or sensory deficits b/l.   PSYCHIATRIC:  patient is alert and oriented x 3.  SKIN: No obvious rash, lesion, or ulcer.   LABORATORY PANEL:  CBC Recent Labs  Lab 02/28/19 1630  WBC 6.8  HGB 9.5*  HCT 30.0*  PLT 281    Chemistries  Recent Labs  Lab 02/28/19 1630  03/02/19 0549  NA 131*   < > 131*  K 4.6   < > 4.4  CL 98   < > 98  CO2 19*   < > 22  GLUCOSE 139*   < > 144*  BUN 94*   < > 96*  CREATININE 4.00*   < > 3.35*  CALCIUM 8.4*   < > 8.1*  AST 26  --   --   ALT 17  --   --   ALKPHOS 122  --   --   BILITOT 1.7*  --   --    < > = values in this interval not displayed.   Cardiac Enzymes No results for input(s): TROPONINI in the last 168 hours. RADIOLOGY:  Dg Chest 2 View  Result Date: 02/28/2019 CLINICAL DATA:  Short of breath EXAM: CHEST - 2 VIEW COMPARISON:  02/01/2019 FINDINGS: Marked cardiomegaly. Vascular congestion with interstitial edema  has progressed from the prior study. No significant pleural effusion. IMPRESSION: Congestive heart failure with mild pulmonary edema. Electronically Signed   By: Franchot Gallo M.D.   On: 02/28/2019 17:48   ASSESSMENT AND PLAN:   Meghan [name]  is a 68 y.o. Caucasian female with a known history of stage IIIb chronic kidney disease, coronary artery disease, diabetes mellitus and systolic CHF with a EF of 25 to 30% as of 12/14/2018 with severe left atrial and moderate respiratory location moderate tricuspid regurgitation, who presented to emergency room with abnormal blood work that was ordered by her  cardiologist this morning and showed elevated BUN and creatinine above previous levels.   1. 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 -Nephrology consultation with Dr. Theador Hawthorne -start IV normal saline at 75 mL an hour. Monitor for respiratory distress. -Daily weight, input output -daily metabolic panel -admission creat 4.0--3.61--IVF--3.35  2. Chronic diastolic/systolic heart failure -EF is 25 to 30% -holding torsemide -appreciate Dr.Gollan's input -currently on metoprolol -holding digoxin and losartan given elevated creatinine -patient does not seem to be in florid heart failure at present  3. Flutter a fib -has had cardioversion in the past -resume back on eliquis-- cardiology plans on cardioversion once patient is on adequate days of eliquis  4. History of G.I. bleed -patient was here in October 2020. Unable to do G.I. procedure during due to her cardiac and respiratory issues -no bleeding. Hemoglobin stable at 9.5 -continue PPI  5. Type II diabetes with diabetic nephropathy -hold PO Glucotrol -sliding scale insulin  6. Hyperlipidemia -continue Lipitor  Family communication : with daughter in the room Consults : rheology Dr. Rockey Situ, nephrology Discharge Disposition : home CODE STATUS: full code DVT Prophylaxis : eliquis  TOTAL TIME TAKING CARE OF THIS PATIENT: **32* minutes.  >50% time spent on counselling and coordination of care  POSSIBLE D/C IN **1 to 2* DAYS, DEPENDING ON CLINICAL CONDITION.  Note: This dictation was prepared with Dragon dictation along with smaller phrase technology. Any transcriptional errors that result from this process are unintentional.  Fritzi Mandes M.D on 03/02/2019 at 12:15 PM  Between 7am to 6pm - Pager - [phone]  After 6pm go to [email or web address]  [facility]   CC: Primary care physician; [name], MDPatient ID: Meghan [name], female   DOB: 11-04-1950, 68 y.o.   MRN: [id number]

## 2019-03-02 NOTE — Plan of Care (Signed)
  Problem: Education: Goal: Knowledge of General Education information will improve Description: Including pain rating scale, medication(s)/side effects and non-pharmacologic comfort measures Outcome: Progressing   Problem: Safety: Goal: Ability to remain free from injury will improve Outcome: Progressing   

## 2019-03-02 NOTE — Plan of Care (Signed)
  Problem: Education: Goal: Knowledge of General Education information will improve Description: Including pain rating scale, medication(s)/side effects and non-pharmacologic comfort measures Outcome: Progressing   Problem: Health Behavior/Discharge Planning: Goal: Ability to manage health-related needs will improve Outcome: Progressing   Problem: Clinical Measurements: Goal: Ability to maintain clinical measurements within normal limits will improve Outcome: Not Progressing Note: BUN = elevated at 96. Will continue to monitor renal function labs. Wenda Low [facility]

## 2019-03-02 NOTE — Progress Notes (Signed)
Meghan [name]  MRN: [id number]  DOB/AGE: 1950/07/13 68 y.o.  Primary Care Physician:Jones, Iven Finn, MD  Admit date: 02/28/2019  Chief Complaint:  Chief Complaint  Patient presents with  . Abnormal Lab  . Weakness    S-Pt presented on  02/28/2019 with  Chief Complaint  Patient presents with  . Abnormal Lab  . Weakness  .    Pt today feels better  Meds . apixaban  5 mg Oral BID  . aspirin EC  81 mg Oral Daily  . atorvastatin  40 mg Oral Daily  . insulin aspart  0-15 Units Subcutaneous TID WC  . insulin aspart  0-5 Units Subcutaneous QHS  . levothyroxine  50 mcg Oral QAC breakfast  . metoprolol tartrate  25 mg Oral BID  . pantoprazole  40 mg Oral Daily  . sodium chloride flush  3 mL Intravenous Q12H         [id number] from the symptoms mentioned above,there are no other symptoms referable to all systems reviewed.  Physical Exam: Vital signs in last 24 hours: Temp:  [97.8 F (36.6 C)-98.7 F (37.1 C)] 98.7 F (37.1 C) (11/22 0814) Pulse Rate:  [45-78] 78 (11/22 0958) Resp:  [18-19] 18 (11/22 0814) BP: (90-107)/(48-60) 90/53 (11/22 0814) SpO2:  [92 %-97 %] 95 % (11/22 0814) Weight:  [120.2 kg] 120.2 kg (11/22 0555) Weight change: 6.801 kg    Intake/Output from previous day: 11/21 0701 - 11/22 0700 In: 1304 [I.V.:1304] Out: 1300 [Urine:1300] No intake/output data recorded.   Physical Exam: General- pt is awake,alert, oriented to time place and person Resp- No acute REsp distress, NO Rhonchi CVS- S1S2 regular ij rate and rhythm GIT- BS+, soft, NT, ND EXT- trace LE Edema, No Cyanosis   Lab Results: CBC Recent Labs    02/28/19 1630  WBC 6.8  HGB 9.5*  HCT 30.0*  PLT 281    BMET Recent Labs    03/01/19 0510 03/02/19 0549  NA 133* 131*  K 4.1 4.4  CL 100 98  CO2 21* 22  GLUCOSE 81 144*  BUN 96* 96*  CREATININE 3.61* 3.35*  CALCIUM 8.3* 8.1*   Creatinine trend During this admission creatinine increased from baseline of 2.0 ==>4.0  ==>3.35 2020 1.7--4.0 Multiple admissions in 2020 with acute kidney injury 2019 1.2--1.6 2018 1.0--1.8     MICRO Recent Results (from the past 240 hour(s))  SARS CORONAVIRUS 2 (TAT 6-24 HRS) Nasopharyngeal Nasopharyngeal Swab     Status: None   Collection Time: 02/28/19  9:33 PM   Specimen: Nasopharyngeal Swab  Result Value Ref Range Status   SARS Coronavirus 2 NEGATIVE NEGATIVE Final    Comment: (NOTE) SARS-CoV-2 target nucleic acids are NOT DETECTED. The SARS-CoV-2 RNA is generally detectable in upper and lower respiratory specimens during the acute phase of infection. Negative results do not preclude SARS-CoV-2 infection, do not rule out co-infections with other pathogens, and should not be used as the sole basis for treatment or other patient management decisions. Negative results must be combined with clinical observations, patient history, and epidemiological information. The expected result is Negative. Fact Sheet for Patients: SugarRoll.be Fact Sheet for Healthcare Providers: https://www.woods-mathews.com/ This test is not yet approved or cleared by the [place] FDA and  has been authorized for detection and/or diagnosis of SARS-CoV-2 by FDA under an Emergency Use Authorization (EUA). This EUA will remain  in effect (meaning this test can be used) for the duration of the COVID-19 declaration under Section 56 4(b)(1) of the  Act, 21 U.S.C. section 360bbb-3(b)(1), unless the authorization is terminated or revoked sooner. Performed at [facility], [place] [address] [place], [place] [address]       Lab Results  Component Value Date   CALCIUM 8.1 (L) 03/02/2019   PHOS 3.5 02/04/2019          Impression: 1)Renal   AKI secondary to cardiorenal syndrome versus intravascular volume depletion Patient data reviewed from February 21 2019 shows that patient was having decreased p.o. intake from past week or so.   Patient dry weight is lower than what  she was discharged with. Upon review of the chest x-ray it does not show much fluid overload when compared to previous x-rays. Patient has AKI most likely from intravascular volume depletion with ARB on board Patient has  AKI on CKD Patient has CKD stage 4. CKD since 2018 CKD secondary to diabetes mellitus  Progression of CKD marked with history of multiple AKI  Patient AKI is now  better as patient creatinine is trending down   2)HTN Patient blood pressure is stable   3)Anemia of chronic disease  HGb at goal (9--11)   4)CKD Mineral-Bone Disorder  Phosphorus at goal.   5) Chronic systolic congestive heart failure Pt was on IV Diuretics Patient is is currently well compensated Patient is being followed closely by the cardiologist and the primary team  6) hyponatremia Patient sodium is trending down We will restrict the free water intake  7)Acid base Co2 now at goal  9) atrial fibrillation Patient is on metoprolol and Eliquis     Plan:  We will reduce the IV fluids . Will follow bmet    Manpreet s [facility] 03/02/2019, 10:09 AM

## 2019-03-03 DIAGNOSIS — Z7189 Other specified counseling: Secondary | ICD-10-CM

## 2019-03-03 DIAGNOSIS — I5022 Chronic systolic (congestive) heart failure: Secondary | ICD-10-CM

## 2019-03-03 DIAGNOSIS — I482 Chronic atrial fibrillation, unspecified: Secondary | ICD-10-CM

## 2019-03-03 DIAGNOSIS — Z515 Encounter for palliative care: Secondary | ICD-10-CM

## 2019-03-03 LAB — GLUCOSE, CAPILLARY
Glucose-Capillary: 115 mg/dL — ABNORMAL HIGH (ref 70–99)
Glucose-Capillary: 217 mg/dL — ABNORMAL HIGH (ref 70–99)
Glucose-Capillary: 224 mg/dL — ABNORMAL HIGH (ref 70–99)
Glucose-Capillary: 186 mg/dL — ABNORMAL HIGH (ref 70–99)
Glucose-Capillary: 154 mg/dL — ABNORMAL HIGH (ref 70–99)
Glucose-Capillary: 171 mg/dL — ABNORMAL HIGH (ref 70–99)
Glucose-Capillary: 208 mg/dL — ABNORMAL HIGH (ref 70–99)

## 2019-03-03 LAB — BASIC METABOLIC PANEL
Anion gap: 11 (ref 5–15)
BUN: 85 mg/dL — ABNORMAL HIGH (ref 8–23)
CO2: 20 mmol/L — ABNORMAL LOW (ref 22–32)
Calcium: 8.3 mg/dL — ABNORMAL LOW (ref 8.9–10.3)
Chloride: 102 mmol/L (ref 98–111)
Creatinine, Ser: 2.64 mg/dL — ABNORMAL HIGH (ref 0.44–1.00)
GFR calc Af Amer: 20 mL/min — ABNORMAL LOW (ref >60)
GFR calc non Af Amer: 18 mL/min — ABNORMAL LOW (ref >60)
Glucose, Bld: 169 mg/dL — ABNORMAL HIGH (ref 70–99)
Potassium: 4.7 mmol/L (ref 3.5–5.1)
Sodium: 133 mmol/L — ABNORMAL LOW (ref 135–145)

## 2019-03-03 MED ORDER — LOSARTAN POTASSIUM 25 MG PO TABS
12.5000 mg | ORAL_TABLET | Freq: Every day | ORAL | Status: DC
  Administered 2019-03-03 – 2019-03-04 (×2): 12.5 mg via ORAL
  Filled 2019-03-03 (×2): qty 1

## 2019-03-03 MED ORDER — TORSEMIDE 20 MG PO TABS
20.0000 mg | ORAL_TABLET | Freq: Two times a day (BID) | ORAL | Status: DC
  Administered 2019-03-03 – 2019-03-04 (×2): 20 mg via ORAL
  Filled 2019-03-03 (×2): qty 1

## 2019-03-03 NOTE — Progress Notes (Signed)
[facility]  ROUNDING NOTE   Subjective:  Patient seen and evaluated at bedside. Renal function improving. BUN down to 85 with a creatinine of 2.64.   Objective:  Vital signs in last 24 hours:  Temp:  [97.7 F (36.5 C)-98.4 F (36.9 C)] 97.8 F (36.6 C) (11/23 0814) Pulse Rate:  [46-85] 78 (11/23 1124) Resp:  [16-18] 16 (11/23 0814) BP: (98-130)/(66-81) 113/81 (11/23 1124) SpO2:  [88 %-100 %] 93 % (11/23 0905) FiO2 (%):  [3 %] 3 % (11/22 2055) Weight:  [121.3 kg] 121.3 kg (11/23 0353)  Weight change: 1.137 kg Filed Weights   03/01/19 0459 03/02/19 0555 03/03/19 0353  Weight: 118 kg 120.2 kg 121.3 kg    Intake/Output: I/O last 3 completed shifts: In: 1324.8 [P.O.:480; I.V.:844.8] Out: 1500 [Urine:1500]   Intake/Output this shift:  Total I/O In: 240 [P.O.:240] Out: 300 [Urine:300]  Physical Exam: General: No acute distress  Head: Normocephalic, atraumatic. Moist oral mucosal membranes  Eyes: Anicteric  Neck: Supple, trachea midline  Lungs:  Clear to auscultation, normal effort  Heart: S1S2 no rubs  Abdomen:  Soft, nontender, bowel sounds present  Extremities:  peripheral edema.  Neurologic: Awake, alert, following commands  Skin: No lesions  Access:     Basic Metabolic Panel: Recent Labs  Lab 02/28/19 1133 02/28/19 1630 03/01/19 0510 03/02/19 0549 03/03/19 0552  NA 130* 131* 133* 131* 133*  K 4.1 4.6 4.1 4.4 4.7  CL 95* 98 100 98 102  CO2 20* 19* 21* 22 20*  GLUCOSE 75 139* 81 144* 169*  BUN 98* 94* 96* 96* 85*  CREATININE 3.96* 4.00* 3.61* 3.35* 2.64*  CALCIUM 8.1* 8.4* 8.3* 8.1* 8.3*    Liver Function Tests: Recent Labs  Lab 02/28/19 1133 02/28/19 1630  AST 27 26  ALT 17 17  ALKPHOS 111 122  BILITOT 1.6* 1.7*  PROT 8.4* 8.5*  ALBUMIN 2.8* 2.8*   No results for input(s): LIPASE, AMYLASE in the last 168 hours. No results for input(s): AMMONIA in the last 168 hours.  CBC: Recent Labs  Lab 02/28/19 1630  WBC 6.8   NEUTROABS 5.1  HGB 9.5*  HCT 30.0*  MCV 80.6  PLT 281    Cardiac Enzymes: No results for input(s): CKTOTAL, CKMB, CKMBINDEX, TROPONINI in the last 168 hours.  BNP: Invalid input(s): POCBNP  CBG: Recent Labs  Lab 03/02/19 1308 03/02/19 1656 03/02/19 2103 03/03/19 0815 03/03/19 1227  GLUCAP 224* 186* 154* 171* 208*    Microbiology: Results for orders placed or performed during the hospital encounter of 02/28/19  SARS CORONAVIRUS 2 (TAT 6-24 HRS) Nasopharyngeal Nasopharyngeal Swab     Status: None   Collection Time: 02/28/19  9:33 PM   Specimen: Nasopharyngeal Swab  Result Value Ref Range Status   SARS Coronavirus 2 NEGATIVE NEGATIVE Final    Comment: (NOTE) SARS-CoV-2 target nucleic acids are NOT DETECTED. The SARS-CoV-2 RNA is generally detectable in upper and lower respiratory specimens during the acute phase of infection. Negative results do not preclude SARS-CoV-2 infection, do not rule out co-infections with other pathogens, and should not be used as the sole basis for treatment or other patient management decisions. Negative results must be combined with clinical observations, patient history, and epidemiological information. The expected result is Negative. Fact Sheet for Patients: SugarRoll.be Fact Sheet for Healthcare Providers: https://www.woods-mathews.com/ This test is not yet approved or cleared by the [place] FDA and  has been authorized for detection and/or diagnosis of SARS-CoV-2 by FDA under an  Emergency Use Authorization (EUA). This EUA will remain  in effect (meaning this test can be used) for the duration of the COVID-19 declaration under Section 56 4(b)(1) of the Act, 21 U.S.C. section 360bbb-3(b)(1), unless the authorization is terminated or revoked sooner. Performed at [facility], [place] [address] [place], [place] [address]     Coagulation Studies: No results for input(s): LABPROT,  INR in the last 72 hours.  Urinalysis: No results for input(s): COLORURINE, LABSPEC, PHURINE, GLUCOSEU, HGBUR, BILIRUBINUR, KETONESUR, PROTEINUR, UROBILINOGEN, NITRITE, LEUKOCYTESUR in the last 72 hours.  Invalid input(s): APPERANCEUR    Imaging: Dg Knee Complete 4 Views Left  Result Date: 03/02/2019 CLINICAL DATA:  Knee pain after a fall. EXAM: LEFT KNEE - COMPLETE 4+ VIEW COMPARISON:  None. FINDINGS: Study limited by positioning and inability of patient to straighten the knee. Within this limitation no acute fracture is evident. No joint effusion. Loss of joint space noted medial and lateral compartments with prolific hypertrophic spurring evident in all 3 compartments. IMPRESSION: Advanced degenerative changes without acute bony abnormality on this limited study. Electronically Signed   By: Misty Stanley M.D.   On: 03/02/2019 13:23     Medications:   . sodium chloride Stopped (03/01/19 1146)   . apixaban  5 mg Oral BID  . aspirin EC  81 mg Oral Daily  . atorvastatin  40 mg Oral Daily  . insulin aspart  0-15 Units Subcutaneous TID WC  . insulin aspart  0-5 Units Subcutaneous QHS  . levothyroxine  50 mcg Oral QAC breakfast  . losartan  12.5 mg Oral Daily  . metoprolol tartrate  25 mg Oral BID  . pantoprazole  40 mg Oral Daily  . sodium chloride flush  3 mL Intravenous Q12H  . torsemide  20 mg Oral BID   sodium chloride, acetaminophen, ALPRAZolam, ondansetron (ZOFRAN) IV, ondansetron, oxyCODONE-acetaminophen, sodium chloride flush  Assessment/ Plan:  68 y.o. female with past medical history of chronic kidney disease stage IIIb, coronary disease, diabetes mellitus type 2, chronic systolic heart failure ejection fraction 25 to 30% who was admitted for acute kidney injury.  1.  Acute kidney injury/chronic kidney disease stage IIIb.  It was felt that the patient was volume depleted upon admission.  Renal function has improved with IV fluid hydration.  Continue to monitor renal  parameters daily.  2.  Chronic systolic heart failure.  Patient now back on diuretics in the form of torsemide 20 mg twice daily, which was less than before.  3.  Diabetes mellitus type 2 with chronic kidney disease.  Glycemic control as per hospitalist.   LOS: 3 [place] Volante 11/23/20202:24 PM

## 2019-03-03 NOTE — Consult Note (Addendum)
Consultation Note Date: 03/03/2019   Patient Name: Meghan [name]  DOB: 1950-04-23  MRN: [id number]  Age / Sex: 68 y.o., female  PCP: [name], MD Referring Physician: Fritzi Mandes, MD  Reason for Consultation: Establishing goals of care  HPI/Patient Profile: Meghan [name]  is a 68 y.o. Caucasian female with a known history of stage IIIb chronic kidney disease, coronary artery disease, diabetes mellitus and systolic CHF with a EF of 25 to 30% as of 12/14/2018 with severe left atrial and moderate respiratory location moderate tricuspid regurgitation, who presented to emergency room with abnormal blood work that was ordered by her cardiologist this morning and showed elevated BUN and creatinine above previous levels.  Clinical Assessment and Goals of Care: Patient is resting in bed. She lives with her daughter at home. At home, she does not use O2. She uses a wheelchair, and is able to move from bed to chair, and to couch. She states she does not require assistance with ADL's.   We discussed her diagnosis, prognosis, GOC, EOL wishes disposition and options.  A detailed discussion was had today regarding advanced directives.  Concepts specific to code status, ventilator, artifical feeding and hydration, and rehospitalization were discussed. Values and goals of care important to patient was attempted to be elicited.  She states she would never want CPR or to be placed on a ventilator. She would never want a feeding tube. Currently, she would like to treat the treatable.    Will continue to follow.      SUMMARY OF RECOMMENDATIONS   Recommend palliative at D/C.   Prognosis:   Unable to determine      Primary Diagnoses: Present on Admission: . Acute kidney injury superimposed on CKD ([place]) . Coronary atherosclerosis   I have reviewed the medical record, interviewed the patient and family, and examined  the patient. The following aspects are pertinent.  Past Medical History:  Diagnosis Date  . Carotid arterial disease ([place])    a. 02/2012 s/p L CEA.  . CKD stage G3b/A1, GFR 30-44 and albumin creatinine ratio <30 mg/g   . Coronary artery disease    a. 02/2012 [facility] MV: Anterior, anteroseptal, septal, apical infarct with mild peri-infarct ischemia, EF 41%- medically managed due to anemia; b. 03/2017 Cath: LM nl, LAD 99ost/p/m - faint L->L collaterals, RI 20ost, LCX 19m, RCA 20p, 39m-->Med Rx for chronic subtotal LAD dzs. Avoid R Radial access in future.  . Diabetes mellitus without complication ([place])    Type II  . HFrEF (heart failure with reduced ejection fraction) ([place])    a. 06/2013 Echo: EF 35-40%; b. 07/2016 Echo: EF 25-30%; c. 11/2016 Echo: EF 20-25%; d. 12/2018 Echo: EF 25-30%, diff HK; e. 12/2018 Echo: EF 25-30%.  . Hyperlipidemia   . Hypertension   . Ischemic cardiomyopathy    a. 06/2013 Echo: EF 35-40%, glob HK; b. 07/2016 Echo: EF 25-30%, sev mid-apicalanteroseptal, ant, apical HK; c. 11/2016 Echo: EF 20-25%, antsept DK, Gr2 DD; d. 12/2018 Echo: EF 25-30%, diff HK.  Marland Kitchen  Morbid obesity ([place])   . Normocytic anemia   . Persistent atrial fibrillation ([facility])    a. CHA2DS2VASc = 7-->eliquis  . Stroke [facility]) 2013   Social History   Socioeconomic History  . Marital status: Widowed    Spouse name: Not on file  . Number of children: Not on file  . Years of education: Not on file  . Highest education level: Not on file  Occupational History  . Not on file  Social Needs  . Financial resource strain: Not on file  . Food insecurity    Worry: Not on file    Inability: Not on file  . Transportation needs    Medical: Not on file    Non-medical: Not on file  Tobacco Use  . Smoking status: Former Smoker    Packs/day: 0.50    Years: 5.00    Pack years: 2.50    Types: Cigarettes  . Smokeless tobacco: Never Used  Substance and Sexual Activity  . Alcohol use: No  . Drug use: No  . Sexual  activity: Not Currently  Lifestyle  . Physical activity    Days per week: 0 days    Minutes per session: Not on file  . Stress: Not on file  Relationships  . Social Herbalist on phone: Three times a week    Gets together: Not on file    Attends religious service: Not on file    Active member of club or organization: Not on file    Attends meetings of clubs or organizations: Not on file    Relationship status: Not on file  Other Topics Concern  . Not on file  Social History Narrative  . Not on file   Family History  Problem Relation Age of Onset  . Heart disease Mother   . Diabetes Mother   . Heart disease Father   . Hypertension Father   . Diabetes Maternal Grandmother    Scheduled Meds: . apixaban  5 mg Oral BID  . aspirin EC  81 mg Oral Daily  . atorvastatin  40 mg Oral Daily  . insulin aspart  0-15 Units Subcutaneous TID WC  . insulin aspart  0-5 Units Subcutaneous QHS  . levothyroxine  50 mcg Oral QAC breakfast  . losartan  12.5 mg Oral Daily  . metoprolol tartrate  25 mg Oral BID  . pantoprazole  40 mg Oral Daily  . sodium chloride flush  3 mL Intravenous Q12H  . torsemide  20 mg Oral BID   Continuous Infusions: . sodium chloride Stopped (03/01/19 1146)   PRN Meds:.sodium chloride, acetaminophen, ALPRAZolam, ondansetron (ZOFRAN) IV, ondansetron, oxyCODONE-acetaminophen, sodium chloride flush Medications Prior to Admission:  Prior to Admission medications   Medication Sig Start Date End Date Taking? Authorizing Provider  apixaban (ELIQUIS) 5 MG TABS tablet Take 1 tablet (5 mg total) by mouth 2 (two) times daily. 02/20/19   Theora Gianotti, NP  atorvastatin (LIPITOR) 40 MG tablet Take 1 tablet (40 mg total) by mouth daily. 02/20/19   Theora Gianotti, NP  glipiZIDE (GLUCOTROL) 10 MG tablet Take 10 mg by mouth 2 (two) times daily. Dr Honor Junes 08/13/15   [provider]  Insulin Degludec 200 UNIT/ML SOPN Inject 25 Units into the  skin daily at 12 noon. Dr Honor Junes (1300) 02/18/18   [provider]  levothyroxine (SYNTHROID) 50 MCG tablet Take 50 mcg by mouth daily before breakfast.    [provider]  losartan (COZAAR) 25  MG tablet Take 0.5 tablets (12.5 mg total) by mouth daily. 02/20/19   Theora Gianotti, NP  metoprolol tartrate (LOPRESSOR) 25 MG tablet Take 1 tablet (25 mg total) by mouth 2 (two) times daily. 02/20/19   Theora Gianotti, NP  ondansetron (ZOFRAN) 4 MG tablet Take 1 tablet (4 mg total) by mouth every 8 (eight) hours as needed for nausea or vomiting. 02/20/19   Theora Gianotti, NP  pantoprazole (PROTONIX) 40 MG tablet Take 1 tablet (40 mg total) by mouth daily. 02/08/19   Mayo, Pete Pelt, MD  torsemide (DEMADEX) 20 MG tablet Take 2 tablets (40 mg total) by mouth 2 (two) times daily. 01/09/19   Wellington Hampshire, MD  triamcinolone cream (KENALOG) 0.5 % Apply 1 application topically 2 (two) times daily as needed (psoriasis).     [provider]   Allergies  Allergen Reactions  . Spironolactone     Hyperkalemia    Review of Systems  Constitutional: Positive for activity change.    Physical Exam Pulmonary:     Effort: Pulmonary effort is normal.  Neurological:     Mental Status: She is alert.     Vital Signs: BP 101/63 (BP Location: Right Arm)   Pulse 89   Temp 97.7 F (36.5 C) (Oral)   Resp 17   Ht 5\' 7"  (1.702 m)   Wt 121.3 kg   SpO2 97%   BMI 41.90 kg/m  Pain Scale: 0-10 POSS *See Group Information*: 1-Acceptable,Awake and alert Pain Score: 0-No pain   SpO2: SpO2: 97 % O2 Device:SpO2: 97 % O2 Flow Rate: .O2 Flow Rate (L/min): 2 L/min  IO: Intake/output summary:   Intake/Output Summary (Last 24 hours) at 03/03/2019 1558 Last data filed at 03/03/2019 1350 Gross per 24 hour  Intake 240 ml  Output 1900 ml  Net -1660 ml    LBM: Last BM Date: 03/01/19(per pt ) Baseline Weight: Weight: 113.4 kg Most recent weight:  Weight: 121.3 kg     Palliative Assessment/Data:     Time In: 3:30 Time Out: 4:02 Time Total: 32 min Greater than 50%  of this time was spent counseling and coordinating care related to the above assessment and plan.  Signed by: Asencion Gowda, NP   Please contact Palliative Medicine Team phone at [phone] for questions and concerns.  For individual provider: See Shea Evans

## 2019-03-03 NOTE — Progress Notes (Signed)
Progress Note  Patient Name: Meghan [name] Date of Encounter: 03/03/2019  Primary Cardiologist: Kathlyn Sacramento, MD   Subjective   She feels stronger but she complains of significant left knee pain.  Her functional capacity has been very limited over the last few months.  She stays mostly in bed.  Inpatient Medications    Scheduled Meds:  apixaban  5 mg Oral BID   aspirin EC  81 mg Oral Daily   atorvastatin  40 mg Oral Daily   insulin aspart  0-15 Units Subcutaneous TID WC   insulin aspart  0-5 Units Subcutaneous QHS   levothyroxine  50 mcg Oral QAC breakfast   losartan  12.5 mg Oral Daily   metoprolol tartrate  25 mg Oral BID   pantoprazole  40 mg Oral Daily   sodium chloride flush  3 mL Intravenous Q12H   torsemide  20 mg Oral BID   Continuous Infusions:  sodium chloride Stopped (03/01/19 1146)   PRN Meds: sodium chloride, acetaminophen, ALPRAZolam, ondansetron (ZOFRAN) IV, ondansetron, oxyCODONE-acetaminophen, sodium chloride flush   Vital Signs    Vitals:   03/03/19 0353 03/03/19 0636 03/03/19 0814 03/03/19 0905  BP: 130/76  112/66   Pulse: 81  63   Resp: 18  16   Temp: 98.4 F (36.9 C)  97.8 F (36.6 C)   TempSrc: Oral  Oral   SpO2: 96% 94% (!) 88% 93%  Weight: 121.3 kg     Height:        Intake/Output Summary (Last 24 hours) at 03/03/2019 0941 Last data filed at 03/03/2019 0448 Gross per 24 hour  Intake 240 ml  Output 1100 ml  Net -860 ml   Last 3 Weights 03/03/2019 03/02/2019 03/01/2019  Weight (lbs) 267 lb 8 oz 264 lb 15.9 oz 260 lb 2.3 oz  Weight (kg) 121.337 kg 120.2 kg 118 kg      Telemetry  Atrial fibrillation- Personally Reviewed  ECG     - Personally Reviewed  Physical Exam   GEN: No acute distress.   Neck:  Unable to estimate JVD Cardiac:  Irregularly irregular, no murmurs, rubs, or gallops.  Respiratory: Clear to auscultation bilaterally, scattered Rales GI: Soft, nontender, non-distended  MS: No edema; No  deformity. Neuro:  Nonfocal  Psych: Normal affect   Labs    High Sensitivity Troponin:   Recent Labs  Lab 02/28/19 1630 02/28/19 2133  TROPONINIHS 9 10      Chemistry Recent Labs  Lab 02/28/19 1133 02/28/19 1630 03/01/19 0510 03/02/19 0549 03/03/19 0552  NA 130* 131* 133* 131* 133*  K 4.1 4.6 4.1 4.4 4.7  CL 95* 98 100 98 102  CO2 20* 19* 21* 22 20*  GLUCOSE 75 139* 81 144* 169*  BUN 98* 94* 96* 96* 85*  CREATININE 3.96* 4.00* 3.61* 3.35* 2.64*  CALCIUM 8.1* 8.4* 8.3* 8.1* 8.3*  PROT 8.4* 8.5*  --   --   --   ALBUMIN 2.8* 2.8*  --   --   --   AST 27 26  --   --   --   ALT 17 17  --   --   --   ALKPHOS 111 122  --   --   --   BILITOT 1.6* 1.7*  --   --   --   GFRNONAA 11* 11* 12* 13* 18*  GFRAA 13* 12* 14* 15* 20*  ANIONGAP 15 14 12 11 11      Hematology Recent Labs  Lab  02/28/19 1630  WBC 6.8  RBC 3.72*  HGB 9.5*  HCT 30.0*  MCV 80.6  MCH 25.5*  MCHC 31.7  RDW 21.5*  PLT 281    BNPNo results for input(s): BNP, PROBNP in the last 168 hours.   DDimer No results for input(s): DDIMER in the last 168 hours.   Radiology    Dg Knee Complete 4 Views Left  Result Date: 03/02/2019 CLINICAL DATA:  Knee pain after a fall. EXAM: LEFT KNEE - COMPLETE 4+ VIEW COMPARISON:  None. FINDINGS: Study limited by positioning and inability of patient to straighten the knee. Within this limitation no acute fracture is evident. No joint effusion. Loss of joint space noted medial and lateral compartments with prolific hypertrophic spurring evident in all 3 compartments. IMPRESSION: Advanced degenerative changes without acute bony abnormality on this limited study. Electronically Signed   By: Misty Stanley M.D.   On: 03/02/2019 13:23    Cardiac Studies     Patient Profile     Meghan [name] is a 68 y.o. female with a hx of ischemic cardiomyopathy with ejection fraction 25 to 30%, atrial flutter/fibrillation, anemia, recent prolonged hospital course for acute on chronic  diastolic and systolic CHF requiring milrinone and aggressive diuresis, presenting to the hospital with weakness, chronic nausea vomiting since discharge, weight loss  Assessment & Plan    1.  Acute on chronic renal failure Likely due to volume depletion on presentation in the setting of low cardiac output.  Renal function improved with hydration.  She was on torsemide 40 mg twice daily and I am going to resume at a lower dose of 20 mg twice daily starting this evening.  2.  Chronic nausea vomiting This was possibly related to uremia and could have been some effect of digoxin which was discontinued.  She feels better.  3.   Chronic systolic heart failure Ejection fraction 25 to 30% Continue metoprolol. I resume small dose losartan given improvement in renal function. I resume smaller dose torsemide.  4.  Atrial fibrillation metoprolol for rate control and Eliquis 5 mg twice daily Rate stable  5.  Diabetes Will defer to medical service on insulin, glipizide as outpatient  6 left knee pain Reports that she fell twice in the emergency room landed on her left knee X-ray was negative.   The patient has been very deconditioned and in addition she has multiple chronic medical conditions that are not expected to improve.  Her quality of life has been gradually declining and I discussed this with the patient and her [name].  I do think she should be DNR.  In addition, hospice care should be considered.  I discussed this with them and they are agreeable to hear the options.  I also discussed the case with Dr. Posey Pronto. I consulted the palliative care team.    For questions or updates, please contact [place] Please consult [email or web address] for contact info under        Signed, Kathlyn Sacramento, MD  03/03/2019, 9:41 AM

## 2019-03-03 NOTE — Evaluation (Signed)
Physical Therapy Evaluation Patient Details Name: Meghan [name] MRN: [id number] DOB: 12-15-1950 Today's Date: 03/03/2019   History of Present Illness  Pt is a 68 y.o. female presenting to hospital 02/28/19 with abnormal lab work up, pain, weakness, and reports of yeast infection.  Per chart pt fell going to bathroom in ED d/t weakness and dizziness; imaging negative for acute bony abnormality L knee.  Pt admitted with acute on chronic systolic CHF, acute on chronic kidney disease, DM, and htn.  PMH includes CAD, CKD, DM, HF, HLD, htn, anemia, a-fib, L toe amp, CEA.  Clinical Impression  Prior to hospital admission, pt was performing stand pivot transfers with SBA (pt used manual w/c as primary means of functional mobility).  Pt lives with her daughter in 1 level home with ramp to enter.  Pt very tender to touch to L anterior knee area (specificially around L patellar tendon area) and pt reporting "pressure" type of pain to muscles surrounding R knee when bending R knee although pt reporting this R knee "pain" was not significant compared to pt's L knee pain.  Initially pt appearing very anxious about any functional mobility (occasionally tearful during session) d/t recent fall with staff but pt willing to attempt some mobility with this therapist.  Currently pt is min assist semi-supine to sit.  Able to sit on edge of bed for extended period of time (pt reporting it felt good to sit on the edge of the bed and appeared to tolerate both knees in flexed position fairly well once sitting edge of bed).  Pt reporting sharp shooting L knee pain when attempting any WB'ing through L LE (to attempt lateral scoot transfer sitting edge of bed) so further mobility deferred and pt assisted back to bed (nurse notified).  Pt would benefit from skilled PT to address noted impairments and functional limitations (see below for any additional details).  Upon hospital discharge, pt would benefit from STR but pt would like to  discharge home with support of family.  For safe home discharge, recommend hoyer lift and hospital bed for additional DME needs and also 24/7 assist (pt reports already having a manual w/c at home).    Follow Up Recommendations SNF(pt prefers home discharge)    Equipment Recommendations  3in1 (PT);Wheelchair (measurements PT);Wheelchair cushion (measurements PT);Hospital bed;Other (comment)(hoyer lift)    Recommendations for Other Services       Precautions / Restrictions Precautions Precautions: Fall Precaution Comments: L knee pain Restrictions Weight Bearing Restrictions: No      Mobility  Bed Mobility Overal bed mobility: Needs Assistance Bed Mobility: Supine to Sit;Sit to Supine;Rolling Rolling: Min assist(minimal assist for L LE d/t pain logrolling L/R in bed to adjust bed sheets; use of bed rails)   Supine to sit: Min assist;HOB elevated Sit to supine: +2 for physical assistance   General bed mobility comments: assist for trunk and L LE semi-supine to sit, use of bed rails, increased time to perform; 2 assist sit to supine and to boost up in bed d/t L knee pain  Transfers Overall transfer level: Needs assistance Equipment used: None Transfers: Lateral/Scoot Transfers          Lateral/Scoot Transfers: Total assist General transfer comment: pt unable to put weight through L LE (d/t sharp shooting L knee pain) to perform any transfers  Ambulation/Gait                Science writer  Modified Rankin (Stroke Patients Only)       Balance Overall balance assessment: Needs assistance Sitting-balance support: No upper extremity supported;Feet supported Sitting balance-Leahy Scale: Good Sitting balance - Comments: steady sitting reaching within BOS                                     Pertinent Vitals/Pain Pain Assessment: 0-10 Pain Score: 3  Pain Location: L knee Pain Descriptors / Indicators:  Shooting;Sharp;Tender(R knee reports of "pressure" in muscles surrounding knee when flexing R knee) Pain Intervention(s): Limited activity within patient's tolerance;Monitored during session;Repositioned;Premedicated before session;Ice applied(RN notified)  Vitals (HR and O2 on 2 L O2 via nasal cannula) stable and WFL throughout treatment session.    Home Living Family/patient expects to be discharged to:: Private residence Living Arrangements: Children Available Help at Discharge: Family;Available 24 hours/day Type of Home: House Home Access: Ramped entrance     Home Layout: One level Home Equipment: Walker - 2 wheels;Wheelchair - manual;Bedside commode Additional Comments: Pt uses BSC (does not go into bathroom).    Prior Function Level of Independence: Needs assistance   Gait / Transfers Assistance Needed: Uses w/c for functional mobility (self propels); perform stand pivots bed to/from w/c and w/c to/from [facility] with SBA.  No home O2.  ADL's / Homemaking Assistance Needed: Assist for ADL's/household chores; take sponge baths        Hand Dominance        Extremity/Trunk Assessment   Upper Extremity Assessment Upper Extremity Assessment: Generalized weakness    Lower Extremity Assessment Lower Extremity Assessment: RLE deficits/detail;LLE deficits/detail RLE Deficits / Details: good quad set; at least 3/5 hip flexion, knee flexion/extension, and DF/PF LLE Deficits / Details: fair L quad set; L knee flexion PROM to grossly 60 degrees in sitting (limited d/t pain); L knee extension grossly 15 degrees short of neutral (limited d/t L knee pain); DF/PF AROM at least 3/5 LLE: Unable to fully assess due to pain    Cervical / Trunk Assessment Cervical / Trunk Assessment: (forward shoulders/head)  Communication   Communication: No difficulties  Cognition Arousal/Alertness: Awake/alert Behavior During Therapy: WFL for tasks assessed/performed(Tearful occasionally d/t recent falls  and L knee pain) Overall Cognitive Status: Within Functional Limits for tasks assessed                                        General Comments   Nursing cleared pt for participation in physical therapy.  Pt agreeable to PT session.    Exercises General Exercises - Lower Extremity Long Arc Quad: AROM;Strengthening;Left;10 reps;Seated(pt performed on own without any prompting (to perform this ex) and appeared to tolerate fairly well)   Assessment/Plan    PT Assessment Patient needs continued PT services  PT Problem List Decreased strength;Decreased range of motion;Decreased activity tolerance;Decreased balance;Decreased mobility;Decreased knowledge of use of DME;Pain       PT Treatment Interventions DME instruction;Functional mobility training;Therapeutic activities;Therapeutic exercise;Balance training;Patient/family education;Wheelchair mobility training    PT Goals (Current goals can be found in the Care Plan section)  Acute Rehab PT Goals Patient Stated Goal: to go home with family PT Goal Formulation: With patient Time For Goal Achievement: 03/17/19 Potential to Achieve Goals: Fair    Frequency Min 2X/week   Barriers to discharge Decreased caregiver support      Co-evaluation  AM-PAC PT "6 Clicks" Mobility  Outcome Measure Help needed turning from your back to your side while in a flat bed without using bedrails?: A Little Help needed moving from lying on your back to sitting on the side of a flat bed without using bedrails?: A Lot Help needed moving to and from a bed to a chair (including a wheelchair)?: Total Help needed standing up from a chair using your arms (e.g., wheelchair or bedside chair)?: Total Help needed to walk in hospital room?: Total Help needed climbing 3-5 steps with a railing? : Total 6 Click Score: 9    End of Session   Activity Tolerance: Patient limited by pain Patient left: in bed;with call bell/phone  within reach;with bed alarm set;with family/visitor present;Other (comment)(B LE's elevated via pillows with heels floating) Nurse Communication: Mobility status;Precautions;Other (comment)(Pt's L knee pain) PT Visit Diagnosis: Other abnormalities of gait and mobility (R26.89);Muscle weakness (generalized) (M62.81);History of falling (Z91.81);Pain Pain - Right/Left: Left Pain - part of body: Knee    Time: 1100-1200 PT Time Calculation (min) (ACUTE ONLY): 60 min   Charges:   PT Evaluation $PT Eval Low Complexity: 1 Low PT Treatments $Therapeutic Exercise: 8-22 mins $Therapeutic Activity: 8-22 mins       Leitha Bleak, PT 03/03/19, 12:34 PM [phone]

## 2019-03-03 NOTE — Progress Notes (Signed)
[facility] [facility] [place] NAME: Meghan [name]    MR#:  [id number]  DATE OF BIRTH:  Sep 13, 1950  SUBJECTIVE:   Patient came in with increasing fatigue ability weakness and abnormal elevated creatinine.   Has knee pain after she had fallen the ER without any trauma  Feels well rested today. Good urine output. [name] in the room appetite okay REVIEW OF SYSTEMS:   Review of Systems  Constitutional: Negative for chills, fever and weight loss.  HENT: Negative for ear discharge, ear pain and nosebleeds.   Eyes: Negative for blurred vision, pain and discharge.  Respiratory: Negative for sputum production, wheezing and stridor.   Cardiovascular: Negative for chest pain, palpitations, orthopnea and PND.  Gastrointestinal: Negative for abdominal pain, diarrhea, nausea and vomiting.  Genitourinary: Negative for frequency and urgency.  Musculoskeletal: Positive for joint pain. Negative for back pain.  Neurological: Positive for weakness. Negative for sensory change, speech change and focal weakness.  Psychiatric/Behavioral: Negative for depression and hallucinations. The patient is not nervous/anxious.    Tolerating Diet:yes Tolerating PT: rec rehab but pt wants to go home  at baseline patient gets out of bed to chair and bedside commode. Does not ambulate much at home.  DRUG ALLERGIES:   Allergies  Allergen Reactions  . Spironolactone     Hyperkalemia     VITALS:  Blood pressure 101/63, pulse 89, temperature 97.7 F (36.5 C), temperature source Oral, resp. rate 17, height 5\' 7"  (1.702 m), weight 121.3 kg, SpO2 97 %.  PHYSICAL EXAMINATION:   Physical Exam  GENERAL:  68 y.o.-year-old patient lying in the bed with no acute distress. Morbidly obese EYES: Pupils equal, round, reactive to light and accommodation. No scleral icterus. HEENT: Head atraumatic, normocephalic. Oropharynx and nasopharynx clear. Oral mucosa dry NECK:  Supple, no  jugular venous distention. No thyroid enlargement, no tenderness.  LUNGS: Normal shallow breath sounds bilaterally, no wheezing, rales, rhonchi. No use of accessory muscles of respiration.  CARDIOVASCULAR: S1, S2 normal. No murmurs, rubs, or gallops.  ABDOMEN: Soft, nontender, nondistended. Bowel sounds present. No organomegaly or mass.  EXTREMITIES: No cyanosis, clubbing  ++ chronic edema b/l.    NEUROLOGIC: Cranial nerves II through XII are intact. No focal Motor or sensory deficits b/l.   PSYCHIATRIC:  patient is alert and oriented x 3.  SKIN: No obvious rash, lesion, or ulcer.   LABORATORY PANEL:  CBC Recent Labs  Lab 02/28/19 1630  WBC 6.8  HGB 9.5*  HCT 30.0*  PLT 281    Chemistries  Recent Labs  Lab 02/28/19 1630  03/03/19 0552  NA 131*   < > 133*  K 4.6   < > 4.7  CL 98   < > 102  CO2 19*   < > 20*  GLUCOSE 139*   < > 169*  BUN 94*   < > 85*  CREATININE 4.00*   < > 2.64*  CALCIUM 8.4*   < > 8.3*  AST 26  --   --   ALT 17  --   --   ALKPHOS 122  --   --   BILITOT 1.7*  --   --    < > = values in this interval not displayed.   Cardiac Enzymes No results for input(s): TROPONINI in the last 168 hours. RADIOLOGY:  Dg Knee Complete 4 Views Left  Result Date: 03/02/2019 CLINICAL DATA:  Knee pain after a fall. EXAM: LEFT KNEE - COMPLETE  4+ VIEW COMPARISON:  None. FINDINGS: Study limited by positioning and inability of patient to straighten the knee. Within this limitation no acute fracture is evident. No joint effusion. Loss of joint space noted medial and lateral compartments with prolific hypertrophic spurring evident in all 3 compartments. IMPRESSION: Advanced degenerative changes without acute bony abnormality on this limited study. Electronically Signed   By: Misty Stanley M.D.   On: 03/02/2019 13:23   ASSESSMENT AND PLAN:   Meghan [name]  is a 68 y.o. Caucasian female with a known history of stage IIIb chronic kidney disease, coronary artery disease, diabetes  mellitus and systolic CHF with a EF of 25 to 30% as of 12/14/2018 with severe left atrial and moderate respiratory location moderate tricuspid regurgitation, who presented to emergency room with abnormal blood work that was ordered by her cardiologist this morning and showed elevated BUN and creatinine above previous levels.   1. 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 -Nephrology consultation with Dr. Theador Hawthorne -IV normal saline at 75 mL an hour. Monitor for respiratory distress. -Daily weight, input output -daily metabolic panel -admission creat 4.0--3.61--IVF--3.35--2.68 -her torsemide 20 mg bid was started and low dose losartan  2. Chronic diastolic/systolic heart failure -EF is 25 to 30% -holding torsemide -appreciate Dr.Gollan's input -currently on metoprolol -holding digoxin given elevated creatinine -patient does not seem to be in florid heart failure at present  3. Flutter a fib -has had cardioversion in the past -resume back on eliquis-- cardiology plans on cardioversion once patient is on adequate days of eliquis  4. History of G.I. bleed -patient was here in October 2020. Unable to do G.I. procedure during due to her cardiac and respiratory issues -no bleeding. Hemoglobin stable at 9.5 -continue PPI  5. Type II diabetes with diabetic nephropathy -hold PO Glucotrol -sliding scale insulin  6. Hyperlipidemia -continue Lipitor  I had a long discussion with patient's daughter [name] who is her healthcare power of attorney. Patient has multiple comorbidities. She has been very poor quality of life. Patient herself states she is getting tired of being in the hospital with medical issues.Dr Fletcher Anon had the conversation to and patient is along with daughter is agreeable for discussion with palliative care goals of care. I did discuss code status with the  daughter and she is in agreement with patient being DNR/DNI  Continues to remain stable patient will discharged tomorrow with home palliative care and home health  Family communication : with daughter in the room Consults : rheology Dr. Rockey Situ, nephrology Discharge Disposition : home CODE STATUS:DNR/DNI DVT Prophylaxis : eliquis  TOTAL TIME TAKING CARE OF THIS PATIENT: **32* minutes.  >50% time spent on counselling and coordination of care  POSSIBLE D/C IN **1 to 2* DAYS, DEPENDING ON CLINICAL CONDITION.  Note: This dictation was prepared with Dragon dictation along with smaller phrase technology. Any transcriptional errors that result from this process are unintentional.  Fritzi Mandes M.D on 03/03/2019 at 4:12 PM  Between 7am to 6pm - Pager - [phone]  After 6pm go to [email or web address]  [facility]   CC: Primary care physician; [name], MDPatient ID: Meghan [name], female   DOB: 1950/09/11, 68 y.o.   MRN: [id number]

## 2019-03-04 ENCOUNTER — Ambulatory Visit: Payer: Medicare Other | Admitting: Nurse Practitioner

## 2019-03-04 LAB — BASIC METABOLIC PANEL
Anion gap: 8 (ref 5–15)
BUN: 76 mg/dL — ABNORMAL HIGH (ref 8–23)
CO2: 24 mmol/L (ref 22–32)
Calcium: 8.4 mg/dL — ABNORMAL LOW (ref 8.9–10.3)
Chloride: 105 mmol/L (ref 98–111)
Creatinine, Ser: 2.19 mg/dL — ABNORMAL HIGH (ref 0.44–1.00)
GFR calc Af Amer: 26 mL/min — ABNORMAL LOW (ref >60)
GFR calc non Af Amer: 22 mL/min — ABNORMAL LOW (ref >60)
Glucose, Bld: 132 mg/dL — ABNORMAL HIGH (ref 70–99)
Potassium: 4.4 mmol/L (ref 3.5–5.1)
Sodium: 137 mmol/L (ref 135–145)

## 2019-03-04 LAB — GLUCOSE, CAPILLARY
Glucose-Capillary: 244 mg/dL — ABNORMAL HIGH (ref 70–99)
Glucose-Capillary: 200 mg/dL — ABNORMAL HIGH (ref 70–99)
Glucose-Capillary: 122 mg/dL — ABNORMAL HIGH (ref 70–99)
Glucose-Capillary: 225 mg/dL — ABNORMAL HIGH (ref 70–99)

## 2019-03-04 MED ORDER — TORSEMIDE 20 MG PO TABS: 20.0000 mg | ORAL_TABLET | Freq: Two times a day (BID) | ORAL | 0 refills | Status: AC

## 2019-03-04 MED ORDER — OXYCODONE-ACETAMINOPHEN 5-325 MG PO TABS: 1.0000 | ORAL_TABLET | Freq: Three times a day (TID) | ORAL | 0 refills | Status: AC | PRN

## 2019-03-04 MED ORDER — DICLOFENAC SODIUM 1 % EX GEL: 2.0000 g | Freq: Three times a day (TID) | CUTANEOUS | 0 refills | Status: AC | PRN

## 2019-03-04 MED ORDER — DICLOFENAC SODIUM 1 % EX GEL
2.0000 g | Freq: Three times a day (TID) | CUTANEOUS | Status: DC | PRN
  Filled 2019-03-04: qty 100

## 2019-03-04 NOTE — Progress Notes (Signed)
New referral for [facility] community Palliative program to follow at home received from [facility] NP. Patient information given to referral. Flo Shanks BSN, RN, [place] [phone]

## 2019-03-04 NOTE — Plan of Care (Signed)
  Problem: Education: Goal: Knowledge of General Education information will improve Description: Including pain rating scale, medication(s)/side effects and non-pharmacologic comfort measures Outcome: Not Progressing   Problem: Health Behavior/Discharge Planning: Goal: Ability to manage health-related needs will improve Outcome: Not Progressing   Problem: Clinical Measurements: Goal: Respiratory complications will improve Outcome: Not Progressing Note: Patient continues to remove nasal cannula, patient pulse ox drops to 80% on room air.

## 2019-03-04 NOTE — Discharge Summary (Signed)
[facility] at [place] NAME: Meghan [name]    MR#:  [id number]  DATE OF BIRTH:  05-08-1950  DATE OF ADMISSION:  02/28/2019 ADMITTING PHYSICIAN: Christel Mormon, MD  DATE OF DISCHARGE: 03/04/2019  PRIMARY CARE PHYSICIAN: [name], MD    ADMISSION DIAGNOSIS:  Weakness [R53.1] Acute renal failure, unspecified acute renal failure type ([place]) [N17.9]  DISCHARGE DIAGNOSIS:  acute on chronic kidney disease stage IV Chronic Diastolic/Systolic Heart Failure Chronic A flutter/Fib Adult Failure to thrive / Bed bound/multiple comorbidities SECONDARY DIAGNOSIS:   Past Medical History:  Diagnosis Date  . Carotid arterial disease ([place])    a. 02/2012 s/p L CEA.  . CKD stage G3b/A1, GFR 30-44 and albumin creatinine ratio <30 mg/g   . Coronary artery disease    a. 02/2012 Lexi MV: Anterior, anteroseptal, septal, apical infarct with mild peri-infarct ischemia, EF 41%- medically managed due to anemia; b. 03/2017 Cath: LM nl, LAD 99ost/p/m - faint L->L collaterals, RI 20ost, LCX 27m, RCA 20p, 73m-->Med Rx for chronic subtotal LAD dzs. Avoid R Radial access in future.  . Diabetes mellitus without complication ([place])    Type II  . HFrEF (heart failure with reduced ejection fraction) ([place])    a. 06/2013 Echo: EF 35-40%; b. 07/2016 Echo: EF 25-30%; c. 11/2016 Echo: EF 20-25%; d. 12/2018 Echo: EF 25-30%, diff HK; e. 12/2018 Echo: EF 25-30%.  . Hyperlipidemia   . Hypertension   . Ischemic cardiomyopathy    a. 06/2013 Echo: EF 35-40%, glob HK; b. 07/2016 Echo: EF 25-30%, sev mid-apicalanteroseptal, ant, apical HK; c. 11/2016 Echo: EF 20-25%, antsept DK, Gr2 DD; d. 12/2018 Echo: EF 25-30%, diff HK.  . Morbid obesity ([place])   . Normocytic anemia   . Persistent atrial fibrillation ([facility])    a. CHA2DS2VASc = 7-->eliquis  . Stroke [facility]) 2013    HOSPITAL COURSE:   Meghan [name] a68 y.o.Caucasian femalewith a known history ofstage IIIb chronic kidney disease, coronary  artery disease, diabetes mellitus and systolic CHF with a EF of 25 to 30% as of 12/14/2018 with severe left atrial and moderate respiratory location moderate tricuspid regurgitation, who presented to emergency room with abnormal blood workthat was ordered by her cardiologist this morning and showed elevated BUN and creatinine above previous levels.   1. 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 -Nephrology consultation with Dr. Antony Madura -IV normal saline at 75 mL an hour. Monitor for respiratory distress. -Daily weight, input output -daily metabolic panel -admission creat 4.0--3.61--IVF--3.35--2.68--2.19 -her torsemide 20 mg bid was started and low dose losartan 12.5 mg qd  2. Chronic diastolic/systolic heart failure -EF is 25 to 30% -appreciate Dr.Gollan's input -currently on metoprolol -holding digoxin given elevated creatinine -patient does not seem to be in florid heart failure at present -sats are 90 to 94%. Patient is not keen on getting oxygen or wearing it continuously at home. Daughter agrees with her. They do not want oxygen set up for home.  3.Chronic Flutter/ a fib -has had cardioversion in the past -resume back on eliquis-- cardiology plans on cardioversion once patient is on adequate days of eliquis  4. History of G.I. bleed -patient was here in October 2020. Unable to do G.I. procedure during due to her cardiac and respiratory issues -no bleeding. Hemoglobin stable at 9.5 -continue PPI  5. Type II diabetes with diabetic nephropathy -hold PO Glucotrol -  sliding scale insulin and Inj Deglucec  6. Hyperlipidemia -continue Lipitor  A long discussion with patient's daughter [name] who is her healthcare power of attorney. Patient has multiple comorbidities. She has been very poor quality of life. Patient herself states she is getting tired  of being in the hospital with medical issues.Dr Fletcher Anon had the conversation to and patient is along with daughter is agreeable for discussion with palliative care goals of care. I did discuss code status with the daughter and she is in agreement with patient being DNR/DNI.  Pt was evaluated and seen by palliative care nurse practitioner. Will arrange for palliative care to follow patient at home  overall appears best at baseline. Will discharge to home. pt and daughter agreeable CONSULTS OBTAINED:  Treatment Team:  Minna Merritts, MD Liana Gerold, MD  DRUG ALLERGIES:   Allergies  Allergen Reactions  . Spironolactone     Hyperkalemia     DISCHARGE MEDICATIONS:   Allergies as of 03/04/2019      Reactions   Spironolactone    Hyperkalemia      Medication List    STOP taking these medications   digoxin 0.125 MG tablet Commonly known as: LANOXIN   glipiZIDE 10 MG tablet Commonly known as: GLUCOTROL     TAKE these medications   apixaban 5 MG Tabs tablet Commonly known as: ELIQUIS Take 1 tablet (5 mg total) by mouth 2 (two) times daily.   atorvastatin 40 MG tablet Commonly known as: LIPITOR Take 1 tablet (40 mg total) by mouth daily.   diclofenac Sodium 1 % Gel Commonly known as: VOLTAREN Apply 2 g topically 3 (three) times daily as needed (left knee pain).   Insulin Degludec 200 UNIT/ML Sopn Inject 25 Units into the skin daily at 12 noon. Dr Honor Junes (1300)   levothyroxine 50 MCG tablet Commonly known as: SYNTHROID Take 50 mcg by mouth daily before breakfast.   losartan 25 MG tablet Commonly known as: COZAAR Take 0.5 tablets (12.5 mg total) by mouth daily.   metoprolol tartrate 25 MG tablet Commonly known as: LOPRESSOR Take 1 tablet (25 mg total) by mouth 2 (two) times daily.   ondansetron 4 MG tablet Commonly known as: Zofran Take 1 tablet (4 mg total) by mouth every 8 (eight) hours as needed for nausea or vomiting.   oxyCODONE-acetaminophen  5-325 MG tablet Commonly known as: PERCOCET/ROXICET Take 1 tablet by mouth every 8 (eight) hours as needed for severe pain.   pantoprazole 40 MG tablet Commonly known as: Protonix Take 1 tablet (40 mg total) by mouth daily.   torsemide 20 MG tablet Commonly known as: DEMADEX Take 1 tablet (20 mg total) by mouth 2 (two) times daily. What changed: how much to take   triamcinolone cream 0.5 % Commonly known as: KENALOG Apply 1 application topically 2 (two) times daily as needed (psoriasis).       If you experience worsening of your admission symptoms, develop shortness of breath, life threatening emergency, suicidal or homicidal thoughts you must seek medical attention immediately by calling 911 or calling your MD immediately  if symptoms less severe.  You Must read complete instructions/literature along with all the possible adverse reactions/side effects for all the Medicines you take and that have been prescribed to you. Take any new Medicines after you have completely understood and accept all the possible adverse reactions/side effects.   Please note  You were cared for by a hospitalist during your hospital stay. If you have any questions  about your discharge medications or the care you received while you were in the hospital after you are discharged, you can call the unit and asked to speak with the hospitalist on call if the hospitalist that took care of you is not available. Once you are discharged, your primary care physician will handle any further medical issues. Please note that NO REFILLS for any discharge medications will be authorized once you are discharged, as it is imperative that you return to your primary care physician (or establish a relationship with a primary care physician if you do not have one) for your aftercare needs so that they can reassess your need for medications and monitor your lab values. Today   SUBJECTIVE   Patient overall feels at baseline. Still  has some left knee pain. Overall good urine output  VITAL SIGNS:  Blood pressure 113/74, pulse 73, temperature (!) 97.5 F (36.4 C), temperature source Oral, resp. rate 18, height 5\' 7"  (1.702 m), weight 119.1 kg, SpO2 92 %.  I/O:    Intake/Output Summary (Last 24 hours) at 03/04/2019 1057 Last data filed at 03/04/2019 1029 Gross per 24 hour  Intake 240 ml  Output 1200 ml  Net -960 ml    PHYSICAL EXAMINATION:  GENERAL:  68 y.o.-year-old patient lying in the bed with no acute distress. Morbidly obese EYES: Pupils equal, round, reactive to light and accommodation. No scleral icterus. Extraocular muscles intact.  HEENT: Head atraumatic, normocephalic. Oropharynx and nasopharynx clear.  NECK:  Supple, no jugular venous distention. No thyroid enlargement, no tenderness.  LUNGS: Normal breath sounds bilaterally, no wheezing, rales,rhonchi or crepitation. No use of accessory muscles of respiration.  CARDIOVASCULAR: S1, S2 normal. No murmurs, rubs, or gallops.  ABDOMEN: Soft, non-tender, non-distended. Bowel sounds present. No organomegaly or mass.  EXTREMITIES: chronic venous stasis and ++ pedal edema, no cyanosis, or clubbing.  NEUROLOGIC: Cranial nerves II through XII are intact. Muscle strength 4/5 in all extremities. Sensation intact. Gait not checked. No focal dficit PSYCHIATRIC: The patient is alert and oriented x 3.  SKIN: No obvious rash, lesion, or ulcer.   DATA REVIEW:   CBC  Recent Labs  Lab 02/28/19 1630  WBC 6.8  HGB 9.5*  HCT 30.0*  PLT 281    Chemistries  Recent Labs  Lab 02/28/19 1630  03/04/19 0502  NA 131*   < > 137  K 4.6   < > 4.4  CL 98   < > 105  CO2 19*   < > 24  GLUCOSE 139*   < > 132*  BUN 94*   < > 76*  CREATININE 4.00*   < > 2.19*  CALCIUM 8.4*   < > 8.4*  AST 26  --   --   ALT 17  --   --   ALKPHOS 122  --   --   BILITOT 1.7*  --   --    < > = values in this interval not displayed.    Microbiology Results   Recent Results (from the  past 240 hour(s))  SARS CORONAVIRUS 2 (TAT 6-24 HRS) Nasopharyngeal Nasopharyngeal Swab     Status: None   Collection Time: 02/28/19  9:33 PM   Specimen: Nasopharyngeal Swab  Result Value Ref Range Status   SARS Coronavirus 2 NEGATIVE NEGATIVE Final    Comment: (NOTE) SARS-CoV-2 target nucleic acids are NOT DETECTED. The SARS-CoV-2 RNA is generally detectable in upper and lower respiratory specimens during the acute phase of infection. Negative results  do not preclude SARS-CoV-2 infection, do not rule out co-infections with other pathogens, and should not be used as the sole basis for treatment or other patient management decisions. Negative results must be combined with clinical observations, patient history, and epidemiological information. The expected result is Negative. Fact Sheet for Patients: SugarRoll.be Fact Sheet for Healthcare Providers: https://www.woods-mathews.com/ This test is not yet approved or cleared by the [place] FDA and  has been authorized for detection and/or diagnosis of SARS-CoV-2 by FDA under an Emergency Use Authorization (EUA). This EUA will remain  in effect (meaning this test can be used) for the duration of the COVID-19 declaration under Section 56 4(b)(1) of the Act, 21 U.S.C. section 360bbb-3(b)(1), unless the authorization is terminated or revoked sooner. Performed at [facility], [place] [address], [place], [place] [address]     RADIOLOGY:  Dg Knee Complete 4 Views Left  Result Date: 03/02/2019 CLINICAL DATA:  Knee pain after a fall. EXAM: LEFT KNEE - COMPLETE 4+ VIEW COMPARISON:  None. FINDINGS: Study limited by positioning and inability of patient to straighten the knee. Within this limitation no acute fracture is evident. No joint effusion. Loss of joint space noted medial and lateral compartments with prolific hypertrophic spurring evident in all 3 compartments. IMPRESSION: Advanced  degenerative changes without acute bony abnormality on this limited study. Electronically Signed   By: Misty Stanley M.D.   On: 03/02/2019 13:23     CODE STATUS:     Code Status Orders  (From admission, onward)         Start     Ordered   03/03/19 1324  Do not attempt resuscitation (DNR)  Continuous    Question Answer Comment  In the event of cardiac or respiratory ARREST Do not call a "code blue"   In the event of cardiac or respiratory ARREST Do not perform Intubation, CPR, defibrillation or ACLS   In the event of cardiac or respiratory ARREST Use medication by any route, position, wound care, and other measures to relive pain and suffering. May use oxygen, suction and manual treatment of airway obstruction as needed for comfort.   Comments spoke with dter [name] at lenght  she is the [facility]      03/03/19 1323        Code Status History    Date Active Date Inactive Code Status Order ID Comments User Context   02/28/2019 2154 03/03/2019 1323 Full Code [id number]  Sidney Ace Arvella Merles, MD ED   01/17/2019 0306 02/08/2019 1750 Full Code [id number]  Lang Snow, NP ED   12/13/2018 1415 12/15/2018 1952 Full Code [id number]  Otila Back, MD ED   03/30/2017 2002 04/01/2017 1940 DNR [id number]  Demetrios Loll, MD Inpatient   03/12/2017 1236 03/12/2017 1720 Full Code [id number]  Wellington Hampshire, MD Inpatient   09/21/2016 2056 09/24/2016 1922 Full Code [id number]  Theodoro Grist, MD Inpatient   07/26/2016 1530 07/28/2016 1812 Full Code [id number]  Theodoro Grist, MD Inpatient   Advance Care Planning Activity     Family Discussion: pt and dter [name] Consults: Dr Arida--cardiology, Palliative care   TOTAL TIME TAKING CARE OF THIS PATIENT: *40* minutes.    Fritzi Mandes M.D on 03/04/2019 at 10:57 AM  Between 7am to 6pm - Pager - [phone] After 6pm go to [email or web address] - password [email or web address]  [facility]    CC: Primary care physician; [name], MD

## 2019-03-04 NOTE — Progress Notes (Signed)
[facility]  ROUNDING NOTE   Subjective:  Patient seen at bedside. Resting comfortably at the moment. Urine output was 1.5 L over the preceding 24 hours. Creatinine down to 2.19.  Objective:  Vital signs in last 24 hours:  Temp:  [97.1 F (36.2 C)-97.7 F (36.5 C)] 97.5 F (36.4 C) (11/24 0825) Pulse Rate:  [59-108] 73 (11/24 0930) Resp:  [17-20] 18 (11/24 0825) BP: (101-113)/(55-81) 113/74 (11/24 0825) SpO2:  [92 %-97 %] 92 % (11/24 0825) Weight:  [119.1 kg] 119.1 kg (11/24 0322)  Weight change: -2.223 kg Filed Weights   03/02/19 0555 03/03/19 0353 03/04/19 0322  Weight: 120.2 kg 121.3 kg 119.1 kg    Intake/Output: I/O last 3 completed shifts: In: 240 [P.O.:240] Out: 2600 [Urine:2600]   Intake/Output this shift:  Total I/O In: 240 [P.O.:240] Out: -   Physical Exam: General: No acute distress  Head: Normocephalic, atraumatic. Moist oral mucosal membranes  Eyes: Anicteric  Neck: Supple, trachea midline  Lungs:  Clear to auscultation, normal effort  Heart: S1S2 no rubs  Abdomen:  Soft, nontender, bowel sounds present  Extremities: Trace peripheral edema.  Neurologic: Awake, alert, following commands  Skin: No lesions       Basic Metabolic Panel: Recent Labs  Lab 02/28/19 1630 03/01/19 0510 03/02/19 0549 03/03/19 0552 03/04/19 0502  NA 131* 133* 131* 133* 137  K 4.6 4.1 4.4 4.7 4.4  CL 98 100 98 102 105  CO2 19* 21* 22 20* 24  GLUCOSE 139* 81 144* 169* 132*  BUN 94* 96* 96* 85* 76*  CREATININE 4.00* 3.61* 3.35* 2.64* 2.19*  CALCIUM 8.4* 8.3* 8.1* 8.3* 8.4*    Liver Function Tests: Recent Labs  Lab 02/28/19 1133 02/28/19 1630  AST 27 26  ALT 17 17  ALKPHOS 111 122  BILITOT 1.6* 1.7*  PROT 8.4* 8.5*  ALBUMIN 2.8* 2.8*   No results for input(s): LIPASE, AMYLASE in the last 168 hours. No results for input(s): AMMONIA in the last 168 hours.  CBC: Recent Labs  Lab 02/28/19 1630  WBC 6.8  NEUTROABS 5.1  HGB 9.5*  HCT 30.0*   MCV 80.6  PLT 281    Cardiac Enzymes: No results for input(s): CKTOTAL, CKMB, CKMBINDEX, TROPONINI in the last 168 hours.  BNP: Invalid input(s): POCBNP  CBG: Recent Labs  Lab 03/03/19 0815 03/03/19 1227 03/03/19 1616 03/03/19 2125 03/04/19 0759  GLUCAP 171* 208* 244* 200* 122*    Microbiology: Results for orders placed or performed during the hospital encounter of 02/28/19  SARS CORONAVIRUS 2 (TAT 6-24 HRS) Nasopharyngeal Nasopharyngeal Swab     Status: None   Collection Time: 02/28/19  9:33 PM   Specimen: Nasopharyngeal Swab  Result Value Ref Range Status   SARS Coronavirus 2 NEGATIVE NEGATIVE Final    Comment: (NOTE) SARS-CoV-2 target nucleic acids are NOT DETECTED. The SARS-CoV-2 RNA is generally detectable in upper and lower respiratory specimens during the acute phase of infection. Negative results do not preclude SARS-CoV-2 infection, do not rule out co-infections with other pathogens, and should not be used as the sole basis for treatment or other patient management decisions. Negative results must be combined with clinical observations, patient history, and epidemiological information. The expected result is Negative. Fact Sheet for Patients: SugarRoll.be Fact Sheet for Healthcare Providers: https://www.woods-mathews.com/ This test is not yet approved or cleared by the [place] FDA and  has been authorized for detection and/or diagnosis of SARS-CoV-2 by FDA under an Emergency Use Authorization (EUA). This EUA  will remain  in effect (meaning this test can be used) for the duration of the COVID-19 declaration under Section 56 4(b)(1) of the Act, 21 U.S.C. section 360bbb-3(b)(1), unless the authorization is terminated or revoked sooner. Performed at [facility], [place] [address] [place], [place] [address]     Coagulation Studies: No results for input(s): LABPROT, INR in the last 72  hours.  Urinalysis: No results for input(s): COLORURINE, LABSPEC, PHURINE, GLUCOSEU, HGBUR, BILIRUBINUR, KETONESUR, PROTEINUR, UROBILINOGEN, NITRITE, LEUKOCYTESUR in the last 72 hours.  Invalid input(s): APPERANCEUR    Imaging: Dg Knee Complete 4 Views Left  Result Date: 03/02/2019 CLINICAL DATA:  Knee pain after a fall. EXAM: LEFT KNEE - COMPLETE 4+ VIEW COMPARISON:  None. FINDINGS: Study limited by positioning and inability of patient to straighten the knee. Within this limitation no acute fracture is evident. No joint effusion. Loss of joint space noted medial and lateral compartments with prolific hypertrophic spurring evident in all 3 compartments. IMPRESSION: Advanced degenerative changes without acute bony abnormality on this limited study. Electronically Signed   By: Misty Stanley M.D.   On: 03/02/2019 13:23     Medications:   . sodium chloride Stopped (03/01/19 1146)   . apixaban  5 mg Oral BID  . aspirin EC  81 mg Oral Daily  . atorvastatin  40 mg Oral Daily  . insulin aspart  0-15 Units Subcutaneous TID WC  . insulin aspart  0-5 Units Subcutaneous QHS  . levothyroxine  50 mcg Oral QAC breakfast  . losartan  12.5 mg Oral Daily  . metoprolol tartrate  25 mg Oral BID  . pantoprazole  40 mg Oral Daily  . sodium chloride flush  3 mL Intravenous Q12H  . torsemide  20 mg Oral BID   sodium chloride, acetaminophen, ALPRAZolam, diclofenac Sodium, ondansetron (ZOFRAN) IV, ondansetron, oxyCODONE-acetaminophen, sodium chloride flush  Assessment/ Plan:  68 y.o. female with past medical history of chronic kidney disease stage IIIb, coronary disease, diabetes mellitus type 2, chronic systolic heart failure ejection fraction 25 to 30% who was admitted for acute kidney injury.  1.  Acute kidney injury/chronic kidney disease stage IIIb.  It was felt that the patient was volume depleted upon admission.  Renal function has gradually improved.  BUN down to 76 with a creatinine of 2.19.  IV  fluids have been stopped.  2.  Chronic systolic heart failure.  Losartan and torsemide have been resumed.  3.  Diabetes mellitus type 2 with chronic kidney disease.  Glycemic control as per hospitalist.   LOS: 4 Izayiah Tibbitts 11/24/202011:07 AM

## 2019-03-04 NOTE — Care Management Important Message (Signed)
Important Message  Patient Details  Name: Meghan [name] MRN: [id number] Date of Birth: May 15, 1950   Medicare Important Message Given:  Yes     Dannette Barbara 03/04/2019, 11:33 AM

## 2019-03-04 NOTE — TOC Transition Note (Signed)
Transition of Care [facility]) - CM/SW Discharge Note   Patient Details  Name: Meghan [name] MRN: [id number] Date of Birth: 1951/01/04  Transition of Care [facility] CM/SW Contact:  Ross Ludwig, LCSW Phone Number: 03/04/2019, 12:45 PM   Clinical Narrative:    Patient is from home with daughter, patient has 24 hour supervision.  Patient does not want to go to SNF, plan is to to go home with home health.  CSW spoke to patient's daughter, who was at bedside, and she is agreeable to having patient go home with home health.  She does not need any other equipment.  Patient will need EMS transport to get home per patient's request.  CSW spoke to [facility], and they are able to accept patient for home health.  CSW to arrange EMS transport for patient.  Final next level of care: [place] Barriers to Discharge: Barriers Resolved   Patient Goals and CMS Choice Patient states their goals for this hospitalization and ongoing recovery are:: To return back home with home health. CMS Medicare.gov Compare Post Acute Care list provided to:: Patient Represenative (must comment) Choice offered to / list presented to : Adult Children  Discharge Placement  Patient will be discharging home with home health through [facility].                 Discharge Plan and Services In-house Referral: Clinical Social Work   Post Acute Care Choice: Home Health          DME Arranged: N/A DME Agency: NA       HH Arranged: RN, PT, Nurse's Aide, Social Work [employer] Agency: [facility] Date [place] Agency Contacted: 03/04/19 Time [place]: [id number] Representative spoke with at [place]: [place] Interventions     Readmission Risk Interventions Readmission Risk Prevention Plan 12/14/2018  Transportation Screening Complete  PCP or Specialist Appt within 5-7 Days Complete  Home Care Screening Complete  Medication Review (RN CM) Complete  Some recent data  might be hidden

## 2019-03-05 ENCOUNTER — Encounter: Payer: Self-pay | Admitting: Emergency Medicine

## 2019-03-05 ENCOUNTER — Inpatient Hospital Stay
Admission: EM | Admit: 2019-03-05 | Discharge: 2019-03-11 | DRG: 064 | Disposition: E | Payer: Medicare Other | Attending: Internal Medicine | Admitting: Internal Medicine

## 2019-03-05 ENCOUNTER — Other Ambulatory Visit: Payer: Self-pay

## 2019-03-05 ENCOUNTER — Emergency Department: Payer: Medicare Other

## 2019-03-05 ENCOUNTER — Telehealth: Payer: Self-pay | Admitting: Cardiovascular Disease

## 2019-03-05 ENCOUNTER — Inpatient Hospital Stay: Payer: Medicare Other

## 2019-03-05 DIAGNOSIS — G9349 Other encephalopathy: Secondary | ICD-10-CM | POA: Diagnosis present

## 2019-03-05 DIAGNOSIS — E1122 Type 2 diabetes mellitus with diabetic chronic kidney disease: Secondary | ICD-10-CM | POA: Diagnosis present

## 2019-03-05 DIAGNOSIS — I6782 Cerebral ischemia: Secondary | ICD-10-CM | POA: Diagnosis not present

## 2019-03-05 DIAGNOSIS — I4819 Other persistent atrial fibrillation: Secondary | ICD-10-CM | POA: Diagnosis present

## 2019-03-05 DIAGNOSIS — N1832 Chronic kidney disease, stage 3b: Secondary | ICD-10-CM | POA: Diagnosis not present

## 2019-03-05 DIAGNOSIS — R079 Chest pain, unspecified: Secondary | ICD-10-CM | POA: Diagnosis not present

## 2019-03-05 DIAGNOSIS — E872 Acidosis: Secondary | ICD-10-CM | POA: Diagnosis present

## 2019-03-05 DIAGNOSIS — R471 Dysarthria and anarthria: Secondary | ICD-10-CM | POA: Diagnosis present

## 2019-03-05 DIAGNOSIS — D631 Anemia in chronic kidney disease: Secondary | ICD-10-CM | POA: Diagnosis present

## 2019-03-05 DIAGNOSIS — N170 Acute kidney failure with tubular necrosis: Secondary | ICD-10-CM | POA: Diagnosis present

## 2019-03-05 DIAGNOSIS — N179 Acute kidney failure, unspecified: Secondary | ICD-10-CM

## 2019-03-05 DIAGNOSIS — R29818 Other symptoms and signs involving the nervous system: Secondary | ICD-10-CM | POA: Diagnosis not present

## 2019-03-05 DIAGNOSIS — Z89422 Acquired absence of other left toe(s): Secondary | ICD-10-CM

## 2019-03-05 DIAGNOSIS — Z20828 Contact with and (suspected) exposure to other viral communicable diseases: Secondary | ICD-10-CM | POA: Diagnosis present

## 2019-03-05 DIAGNOSIS — I959 Hypotension, unspecified: Secondary | ICD-10-CM | POA: Diagnosis present

## 2019-03-05 DIAGNOSIS — Z515 Encounter for palliative care: Secondary | ICD-10-CM | POA: Diagnosis present

## 2019-03-05 DIAGNOSIS — R1110 Vomiting, unspecified: Secondary | ICD-10-CM | POA: Diagnosis not present

## 2019-03-05 DIAGNOSIS — I639 Cerebral infarction, unspecified: Secondary | ICD-10-CM | POA: Diagnosis present

## 2019-03-05 DIAGNOSIS — R414 Neurologic neglect syndrome: Secondary | ICD-10-CM | POA: Diagnosis present

## 2019-03-05 DIAGNOSIS — J168 Pneumonia due to other specified infectious organisms: Secondary | ICD-10-CM | POA: Diagnosis not present

## 2019-03-05 DIAGNOSIS — J189 Pneumonia, unspecified organism: Secondary | ICD-10-CM

## 2019-03-05 DIAGNOSIS — I130 Hypertensive heart and chronic kidney disease with heart failure and stage 1 through stage 4 chronic kidney disease, or unspecified chronic kidney disease: Secondary | ICD-10-CM | POA: Diagnosis present

## 2019-03-05 DIAGNOSIS — I480 Paroxysmal atrial fibrillation: Secondary | ICD-10-CM

## 2019-03-05 DIAGNOSIS — R778 Other specified abnormalities of plasma proteins: Secondary | ICD-10-CM

## 2019-03-05 DIAGNOSIS — G460 Middle cerebral artery syndrome: Secondary | ICD-10-CM | POA: Diagnosis present

## 2019-03-05 DIAGNOSIS — K859 Acute pancreatitis without necrosis or infection, unspecified: Secondary | ICD-10-CM | POA: Diagnosis not present

## 2019-03-05 DIAGNOSIS — E785 Hyperlipidemia, unspecified: Secondary | ICD-10-CM | POA: Diagnosis present

## 2019-03-05 DIAGNOSIS — R29810 Facial weakness: Secondary | ICD-10-CM | POA: Diagnosis present

## 2019-03-05 DIAGNOSIS — Z66 Do not resuscitate: Secondary | ICD-10-CM | POA: Diagnosis present

## 2019-03-05 DIAGNOSIS — I2510 Atherosclerotic heart disease of native coronary artery without angina pectoris: Secondary | ICD-10-CM | POA: Diagnosis present

## 2019-03-05 DIAGNOSIS — R29714 NIHSS score 14: Secondary | ICD-10-CM | POA: Diagnosis present

## 2019-03-05 DIAGNOSIS — I6521 Occlusion and stenosis of right carotid artery: Secondary | ICD-10-CM | POA: Diagnosis not present

## 2019-03-05 DIAGNOSIS — I10 Essential (primary) hypertension: Secondary | ICD-10-CM | POA: Diagnosis present

## 2019-03-05 DIAGNOSIS — Z6841 Body Mass Index (BMI) 40.0 and over, adult: Secondary | ICD-10-CM

## 2019-03-05 DIAGNOSIS — I5023 Acute on chronic systolic (congestive) heart failure: Secondary | ICD-10-CM | POA: Diagnosis present

## 2019-03-05 DIAGNOSIS — R4182 Altered mental status, unspecified: Secondary | ICD-10-CM

## 2019-03-05 DIAGNOSIS — N184 Chronic kidney disease, stage 4 (severe): Secondary | ICD-10-CM | POA: Diagnosis present

## 2019-03-05 DIAGNOSIS — R7989 Other specified abnormal findings of blood chemistry: Secondary | ICD-10-CM

## 2019-03-05 DIAGNOSIS — N183 Chronic kidney disease, stage 3 unspecified: Secondary | ICD-10-CM | POA: Diagnosis present

## 2019-03-05 DIAGNOSIS — R0902 Hypoxemia: Secondary | ICD-10-CM | POA: Diagnosis not present

## 2019-03-05 DIAGNOSIS — J90 Pleural effusion, not elsewhere classified: Secondary | ICD-10-CM | POA: Diagnosis not present

## 2019-03-05 DIAGNOSIS — R531 Weakness: Secondary | ICD-10-CM

## 2019-03-05 DIAGNOSIS — R0689 Other abnormalities of breathing: Secondary | ICD-10-CM | POA: Diagnosis not present

## 2019-03-05 DIAGNOSIS — R188 Other ascites: Secondary | ICD-10-CM | POA: Diagnosis not present

## 2019-03-05 DIAGNOSIS — E860 Dehydration: Secondary | ICD-10-CM | POA: Diagnosis not present

## 2019-03-05 DIAGNOSIS — G8194 Hemiplegia, unspecified affecting left nondominant side: Secondary | ICD-10-CM | POA: Diagnosis present

## 2019-03-05 DIAGNOSIS — Z8673 Personal history of transient ischemic attack (TIA), and cerebral infarction without residual deficits: Secondary | ICD-10-CM

## 2019-03-05 DIAGNOSIS — E875 Hyperkalemia: Secondary | ICD-10-CM | POA: Diagnosis present

## 2019-03-05 DIAGNOSIS — I4891 Unspecified atrial fibrillation: Secondary | ICD-10-CM | POA: Diagnosis not present

## 2019-03-05 DIAGNOSIS — E1165 Type 2 diabetes mellitus with hyperglycemia: Secondary | ICD-10-CM | POA: Diagnosis not present

## 2019-03-05 DIAGNOSIS — Z87891 Personal history of nicotine dependence: Secondary | ICD-10-CM

## 2019-03-05 DIAGNOSIS — I6389 Other cerebral infarction: Secondary | ICD-10-CM | POA: Diagnosis not present

## 2019-03-05 DIAGNOSIS — R410 Disorientation, unspecified: Secondary | ICD-10-CM | POA: Diagnosis not present

## 2019-03-05 LAB — CBC WITH DIFFERENTIAL/PLATELET
Abs Immature Granulocytes: 0.42 10*3/uL — ABNORMAL HIGH (ref 0.00–0.07)
Basophils Absolute: 0.1 10*3/uL (ref 0.0–0.1)
Basophils Relative: 1 %
Eosinophils Absolute: 0 10*3/uL (ref 0.0–0.5)
Eosinophils Relative: 0 %
HCT: 34.2 % — ABNORMAL LOW (ref 36.0–46.0)
Hemoglobin: 9.9 g/dL — ABNORMAL LOW (ref 12.0–15.0)
Immature Granulocytes: 3 %
Lymphocytes Relative: 4 %
Lymphs Abs: 0.5 10*3/uL — ABNORMAL LOW (ref 0.7–4.0)
MCH: 25.7 pg — ABNORMAL LOW (ref 26.0–34.0)
MCHC: 28.9 g/dL — ABNORMAL LOW (ref 30.0–36.0)
MCV: 88.8 fL (ref 80.0–100.0)
Monocytes Absolute: 0.8 10*3/uL (ref 0.1–1.0)
Monocytes Relative: 6 %
Neutro Abs: 11.2 10*3/uL — ABNORMAL HIGH (ref 1.7–7.7)
Neutrophils Relative %: 86 %
Platelets: 300 10*3/uL (ref 150–400)
RBC: 3.85 MIL/uL — ABNORMAL LOW (ref 3.87–5.11)
RDW: 22.6 % — ABNORMAL HIGH (ref 11.5–15.5)
Smear Review: NORMAL
WBC: 13 10*3/uL — ABNORMAL HIGH (ref 4.0–10.5)
nRBC: 0.2 % (ref 0.0–0.2)

## 2019-03-05 LAB — COMPREHENSIVE METABOLIC PANEL
ALT: 21 U/L (ref 0–44)
AST: 38 U/L (ref 15–41)
Albumin: 2.7 g/dL — ABNORMAL LOW (ref 3.5–5.0)
Alkaline Phosphatase: 107 U/L (ref 38–126)
Anion gap: 15 (ref 5–15)
BUN: 88 mg/dL — ABNORMAL HIGH (ref 8–23)
CO2: 18 mmol/L — ABNORMAL LOW (ref 22–32)
Calcium: 8.7 mg/dL — ABNORMAL LOW (ref 8.9–10.3)
Chloride: 103 mmol/L (ref 98–111)
Creatinine, Ser: 3.59 mg/dL — ABNORMAL HIGH (ref 0.44–1.00)
GFR calc Af Amer: 14 mL/min — ABNORMAL LOW (ref >60)
GFR calc non Af Amer: 12 mL/min — ABNORMAL LOW (ref >60)
Glucose, Bld: 183 mg/dL — ABNORMAL HIGH (ref 70–99)
Potassium: 5.7 mmol/L — ABNORMAL HIGH (ref 3.5–5.1)
Sodium: 136 mmol/L (ref 135–145)
Total Bilirubin: 2.7 mg/dL — ABNORMAL HIGH (ref 0.3–1.2)
Total Protein: 8.6 g/dL — ABNORMAL HIGH (ref 6.5–8.1)

## 2019-03-05 LAB — URINE DRUG SCREEN, QUALITATIVE (ARMC ONLY)
Amphetamines, Ur Screen: NOT DETECTED
Barbiturates, Ur Screen: NOT DETECTED
Benzodiazepine, Ur Scrn: NOT DETECTED
Cannabinoid 50 Ng, Ur ~~LOC~~: NOT DETECTED
Cocaine Metabolite,Ur ~~LOC~~: NOT DETECTED
MDMA (Ecstasy)Ur Screen: NOT DETECTED
Methadone Scn, Ur: NOT DETECTED
Opiate, Ur Screen: POSITIVE — AB
Phencyclidine (PCP) Ur S: NOT DETECTED
Tricyclic, Ur Screen: NOT DETECTED

## 2019-03-05 LAB — URINALYSIS, ROUTINE W REFLEX MICROSCOPIC
Bilirubin Urine: NEGATIVE
Glucose, UA: NEGATIVE mg/dL
Ketones, ur: NEGATIVE mg/dL
Nitrite: NEGATIVE
Protein, ur: 30 mg/dL — AB
Specific Gravity, Urine: 1.019 (ref 1.005–1.030)
pH: 5 (ref 5.0–8.0)

## 2019-03-05 LAB — LIPASE, BLOOD: Lipase: 1320 U/L — ABNORMAL HIGH (ref 11–51)

## 2019-03-05 LAB — LACTIC ACID, PLASMA: Lactic Acid, Venous: 2.3 mmol/L (ref 0.5–1.9)

## 2019-03-05 LAB — PROTIME-INR
INR: 3.3 — ABNORMAL HIGH (ref 0.8–1.2)
Prothrombin Time: 33.1 seconds — ABNORMAL HIGH (ref 11.4–15.2)

## 2019-03-05 LAB — TROPONIN I (HIGH SENSITIVITY)
Troponin I (High Sensitivity): 66 ng/L — ABNORMAL HIGH (ref <18)
Troponin I (High Sensitivity): 102 ng/L (ref <18)

## 2019-03-05 LAB — APTT: aPTT: 50 seconds — ABNORMAL HIGH (ref 24–36)

## 2019-03-05 LAB — ETHANOL: Alcohol, Ethyl (B): <10 mg/dL (ref <10)

## 2019-03-05 MED ORDER — HYDROMORPHONE HCL 1 MG/ML IJ SOLN
1.0000 mg | INTRAMUSCULAR | Status: DC | PRN
  Administered 2019-03-05 – 2019-03-06 (×2): 1 mg via INTRAVENOUS
  Filled 2019-03-05 (×2): qty 1

## 2019-03-05 MED ORDER — ONDANSETRON HCL 4 MG/2ML IJ SOLN
4.0000 mg | Freq: Once | INTRAMUSCULAR | Status: AC
  Administered 2019-03-05: 20:00:00 4 mg via INTRAVENOUS

## 2019-03-05 MED ORDER — ACETAMINOPHEN 160 MG/5ML PO SOLN
650.0000 mg | ORAL | Status: DC | PRN
  Filled 2019-03-05: qty 20.3

## 2019-03-05 MED ORDER — STROKE: EARLY STAGES OF RECOVERY BOOK: Freq: Once | Status: DC

## 2019-03-05 MED ORDER — SODIUM CHLORIDE 0.9 % IV SOLN
2.0000 g | Freq: Once | INTRAVENOUS | Status: AC
  Administered 2019-03-05: 2 g via INTRAVENOUS
  Filled 2019-03-05: qty 2

## 2019-03-05 MED ORDER — ACETAMINOPHEN 325 MG PO TABS: 650.0000 mg | ORAL_TABLET | ORAL | Status: DC | PRN

## 2019-03-05 MED ORDER — SODIUM CHLORIDE 0.9 % IV SOLN
Freq: Once | INTRAVENOUS | Status: AC
  Administered 2019-03-05: 21:00:00 via INTRAVENOUS

## 2019-03-05 MED ORDER — ONDANSETRON HCL 4 MG/2ML IJ SOLN
INTRAMUSCULAR | Status: AC
  Administered 2019-03-05: 4 mg via INTRAVENOUS
  Filled 2019-03-05: qty 2

## 2019-03-05 MED ORDER — SODIUM CHLORIDE 0.9 % IV BOLUS
1000.0000 mL | Freq: Once | INTRAVENOUS | Status: AC
  Administered 2019-03-05: 1000 mL via INTRAVENOUS

## 2019-03-05 MED ORDER — ACETAMINOPHEN 650 MG RE SUPP: 650.0000 mg | RECTAL | Status: DC | PRN

## 2019-03-05 MED ORDER — VANCOMYCIN HCL IN DEXTROSE 1-5 GM/200ML-% IV SOLN
1000.0000 mg | Freq: Once | INTRAVENOUS | Status: AC
  Administered 2019-03-05: 1000 mg via INTRAVENOUS
  Filled 2019-03-05: qty 200

## 2019-03-05 NOTE — Telephone Encounter (Signed)
Patient's daughter calling  States that patient is very weak, unable to keep food down and in pain - has just recently got back home from hospital  They were to be getting a referral for hospice  Would like to know if they should go back to hospital  Please advise

## 2019-03-05 NOTE — ED Notes (Signed)
Tele Neuro notified of pt, pt continues to have generalized weakness and thick speech.  Pt drifts to sleep easily, but answers questions appropriately when awake.

## 2019-03-05 NOTE — Telephone Encounter (Signed)
Spoke with Neoma Laming with Hospice division of [facility]. She reviewed patient's chart in Epic and found it looks like from the NP note that patient and family wanted to "treat the treatable." Therefore, it appears they wanted more of a Palliative care approach. However, Neoma Laming said it could be arranged for Hospice to start with patient if needed.   Neoma Laming advised me to speak with Santiago Glad with the Palliative division and liaison with the hospital. Santiago Glad advised me that if patient and family desire Hospice then we could move forward with that option as well.  With both Neoma Laming and Santiago Glad, I discussed that patient's daughter does not feel comfortable having the patient at home right now and that she prefers to take the patient to the hospital and then orchestrate the care.  They were both very helpful. They will be available to aid in patient's discharge or home management with whatever decision the patient and daughter make from this point. I am very appreciative for their guidance.

## 2019-03-05 NOTE — Consult Note (Addendum)
[facility] TeleNeurology Consult Services   Date of Service:   02/13/2019 18:59:21  Impression:     .  Right Hemispheric Stroke Syndrome. Rule out LVO     - Encephalopathy with focal features and asterixis.  Comments/Sign-Out: Right MCA syndrome, right gaze/left neglect and left sided weakness, also asterixis and altered level of alertness. No Alteplase due to coagulopathy INR 3.3 and on Apixaban. CTA pending looking for LVO Chemistries pending Leukocytosis  Metrics: Last Known Well: 02/15/2019 17:00:00 [facility] Notification Time: 02/12/2019 18:59:21 Arrival Time: 03/02/2019 17:45:00 Stamp Time: 03/01/2019 18:59:21 Time First Login Attempt: 02/21/2019 19:02:46 Video Start Time: 02/24/2019 19:03:13  Symptoms: left sided weakness NIHSS Start Assessment Time: 02/25/2019 19:04:58 Patient is not a candidate for Alteplase/Activase. Patient was not deemed candidate for Alteplase/Activase thrombolytics because of Use of NOAs within 48 hours. Video End Time: 02/20/2019 19:12:32  CT head showed no acute hemorrhage or acute core infarct.  Clinical Presentation is Suggestive of Large Vessel Occlusive Disease, Recommendations are as Follows  CTA Head and Neck. Advanced Imaging to be Reviewed by ED Provider and NIR.   ED Physician notified of diagnostic impression and management plan on 03/04/2019 19:14:32  Our recommendations are outlined below.  Recommendations: STAT CTA head and neck pending, looking for LVO for potential NIR intervention      .  Activate Stroke Protocol Admission/Order Set     .  Stroke/Telemetry Floor     .  Neuro Checks     .  Bedside Swallow Eval     .  DVT Prophylaxis     .  IV Fluids, Normal Saline     .  Head of Bed 30 Degrees     .  Euglycemia and Avoid Hyperthermia (PRN Acetaminophen)     .  Hold Antithrombotics for Now     .  NPO  Routine Consultation with [facility] for Follow up Care  Sign Out:     .   Discussed with Emergency Department Provider    ------------------------------------------------------------------------------  History of Present Illness: Patient is a 68 year old Meghan [name].  Patient was brought by EMS for symptoms of left sided weakness  Discharged from hospital yesterday 11/24, generalized weakness due to dehydration. Remote stroke, used wheel chair for ambulation. On Apixaban, last dose this AM. Since about 5PM left face droop and confusion. No LOC< no seizure activity. Unwell for several days but face droop new since today. Daughter at bedside helps with history   Past Medical History:     . Hypertension     . Diabetes Mellitus     . Hyperlipidemia     . Atrial Fibrillation     . Stroke  Anticoagulant use:  Apixaban  Antiplatelet use: No  Current Outpatient Medications  Medication Instructions  . apixaban (ELIQUIS) 5 mg, Oral, 2 times daily  . atorvastatin (LIPITOR) 40 mg, Oral, Daily  . diclofenac Sodium (VOLTAREN) 2 g, Topical, 3 times daily PRN  . Insulin Degludec 25 Units, Subcutaneous, Daily, Dr Honor Junes (1300)  . levothyroxine (SYNTHROID) 50 mcg, Oral, Daily before breakfast  . losartan (COZAAR) 12.5 mg, Oral, Daily  . metoprolol tartrate (LOPRESSOR) 25 mg, Oral, 2 times daily  . ondansetron (ZOFRAN) 4 mg, Oral, Every 8 hours PRN  . oxyCODONE-acetaminophen (PERCOCET/ROXICET) 5-325 MG tablet 1 tablet, Oral, Every 8 hours PRN  . pantoprazole (PROTONIX) 40 mg, Oral, Daily  . torsemide (DEMADEX) 20 mg, Oral, 2 times daily  . triamcinolone cream (KENALOG) 0.5 % 1 application, Topical, 2  times daily PRN    Examination: BP(117/57), Pulse(66), Blood Glucose(225) Vitals:   02/10/2019 1759 02/13/2019 1800  BP: (!) 117/57   Pulse: 66   Temp: 98.5 F (36.9 C)   Resp: 19   Height:  5\' 7"  (1.702 m)  Weight:  119 kg  SpO2: 95%   TempSrc: Axillary   BMI (Calculated):  41.08   Somnolent, right gaze preference, left face droop, left arm drift, bilateral leg  weakness. Bilateral arm asterixis.Moderate dysarthria. Oriented. Wakes up to verbal stimulation. 1A: Level of Consciousness - Arouses to minor stimulation + 1 1B: Ask Month and Age - Both Questions Right + 0 1C: Blink Eyes & Squeeze Hands - Performs Both Tasks + 0 2: Test Horizontal Extraocular Movements - Partial Gaze Palsy: Can Be Overcome + 1 3: Test Visual Fields - Partial Hemianopia + 1 4: Test Facial Palsy (Use Grimace if Obtunded) - Partial paralysis (lower face) + 2 5A: Test Left Arm Motor Drift - Drift, but doesn't hit bed + 1 5B: Test Right Arm Motor Drift - No Drift for 10 Seconds + 0 6A: Test Left Leg Motor Drift - No Effort Against Gravity + 3 6B: Test Right Leg Motor Drift - Some Effort Against Gravity + 2 7: Test Limb Ataxia (FNF/Heel-Shin) - No Ataxia + 0 8: Test Sensation - Normal; No sensory loss + 0 9: Test Language/Aphasia - Mild-Moderate Aphasia: Some Obvious Changes, Without Significant Limitation + 1 10: Test Dysarthria - Mild-Moderate Dysarthria: Slurring but can be understood + 1 11: Test Extinction/Inattention - Visual/tactile/auditory/spatial/personal inattention + 1  NIHSS Score: 14  Pre-Morbid Modified Ranking Scale: 4 Points = Moderately severe disability; unable to walk and attend to bodily needs without assistance   Patient/Family was informed the Neurology Consult would happen via TeleHealth consult by way of interactive audio and video telecommunications and consented to receiving care in this manner.   Due to the immediate potential for life-threatening deterioration due to underlying acute neurologic illness, I spent 20 minutes providing critical care. This time includes time for face to face visit via telemedicine, review of medical records, imaging studies and discussion of findings with providers, the patient and/or family.   Dr Lita Mains   [facility] [phone]   Case [id number]

## 2019-03-05 NOTE — ED Notes (Signed)
Date and time results received: 02/28/2019 10:48 PM    Test: Troponin Critical Value: 102  Name of Provider Notified: Dr. Maudie Mercury

## 2019-03-05 NOTE — Telephone Encounter (Signed)
Spoke with patient's daughter. Since patient returned home yesterday, patient has not been able to keep down food or medicine. Patient has not voided and is vomiting. Patient refusing meds. Patient saying "just let me die." Daughter is concerned because her children are home and it is upsetting them to hear their grandmother saying this. It appears patient had Palliative Care consult and referral prior to discharge though daughter does not remember talking to anyone or does not have knowledge of when someone would call.   Discussed with Ignacia Bayley, NP and advised me to contact the [facility] to see when they may be contacting patient and otherwise, patient may need to go back to the ER.

## 2019-03-05 NOTE — H&P (Addendum)
TRH H&P    Patient Demographics:    Meghan [name], is a 68 y.o. female  MRN: [id number]  DOB - 11-09-1950  Admit Date - 02/11/2019  Referring MD/NP/PA:   Nance Pear  Outpatient Primary MD for the patient is [name], MD   Patient coming from:  home  Chief complaint- left sided weakness, left facial droop, tremor   HPI:    Meghan [name]  is a 69 y.o. female,  w hypertension, hyperlipidemia, Dm2, CKD stage 3, Pafib, CHF (EF 25%), CAD, carotid stenosis, anemia, apparently presents with c/o left sided weakness, and left facial droop starting at 5pm.  Pt presented to ED for code stroke not a candidate for TPA due to INR 3.3  In ED,  T 98,5, P 66, R 19, Bp 117/57  Pox 95% on 2L [place] Wt 119kg  CT brain  IMPRESSION: 1. Motion degraded examination. 2. No acute intracranial hemorrhage or acute demarcated cortical infarction. 3. Redemonstrated chronic high left frontal lobe infarct predominantly involving the white matter. 4. Background of mild generalized parenchymal atrophy and chronic small vessel ischemic disease.   CT abd/ pelvis IMPRESSION: 1. Peripancreatic fat stranding is increased since exam last month, suspicious for acute pancreatitis. Small amount of peripancreatic free fluid. No organized peripancreatic collection. 2. Fluid within the duodenum with questionable wall thickening in the third and fourth portion,, likely reactive. 3. Patchy bibasilar airspace disease, left greater than right, may be atelectasis or pneumonia. Small left pleural effusion. 4. Similar perihepatic ascites to prior exam. 5. Additional chronic findings are stable from recent prior.  Wbc 13.0, hgb 9.9, Plt  300 INR 3.3  PTT 50 Na 136, K 5.7, Bun 88, Creatinine 3.59 Alb 2.7 Ast 38, Alt 21  Lipase 1,320 Trop 66 -> 102 Urinalysis  Wbc 21-50  Rbc 6-10  Blood cullture x2 pending  Pt will be admitted for  w/up of left sided weakness, tia/ cva, and acute pancreatitis, and possibly hcap, and also acute lower UTI.     Review of systems:    In addition to the HPI above, pt is unable to provide due to AMS No Fever-chills, No Headache, No changes with Vision or hearing, No problems swallowing food or Liquids, No Chest pain, Cough or Shortness of Breath, No Abdominal pain, No Nausea or Vomiting, bowel movements are regular, No Blood in stool or Urine, No dysuria, No new skin rashes or bruises, No new joints pains-aches,  No new weakness, tingling, numbness in any extremity, No recent weight gain or loss, No polyuria, polydypsia or polyphagia, No significant Mental Stressors.  All other systems reviewed and are negative.    Past History of the following :    Past Medical History:  Diagnosis Date   Carotid arterial disease ([place])    a. 02/2012 s/p L CEA.   CKD stage G3b/A1, GFR 30-44 and albumin creatinine ratio <30 mg/g    Coronary artery disease    a. 02/2012 Lexi MV: Anterior, anteroseptal, septal, apical infarct with mild peri-infarct ischemia, EF 41%- medically managed  due to anemia; b. 03/2017 Cath: LM nl, LAD 99ost/p/m - faint L->L collaterals, RI 20ost, LCX 54m, RCA 20p, 70m-->Med Rx for chronic subtotal LAD dzs. Avoid R Radial access in future.   Diabetes mellitus without complication ([place])    Type II   HFrEF (heart failure with reduced ejection fraction) ([place])    a. 06/2013 Echo: EF 35-40%; b. 07/2016 Echo: EF 25-30%; c. 11/2016 Echo: EF 20-25%; d. 12/2018 Echo: EF 25-30%, diff HK; e. 12/2018 Echo: EF 25-30%.   Hyperlipidemia    Hypertension    Ischemic cardiomyopathy    a. 06/2013 Echo: EF 35-40%, glob HK; b. 07/2016 Echo: EF 25-30%, sev mid-apicalanteroseptal, ant, apical HK; c. 11/2016 Echo: EF 20-25%, antsept DK, Gr2 DD; d. 12/2018 Echo: EF 25-30%, diff HK.   Morbid obesity ([place])    Normocytic anemia    Persistent atrial fibrillation ([place])    a. CHA2DS2VASc =  7-->eliquis   Stroke [facility]) 2013      Past Surgical History:  Procedure Laterality Date   AMPUTATION TOE Left 09/22/2016   Procedure: AMPUTATION TOE;  Surgeon: Albertine Patricia, DPM;  Location: [facility] ORS;  Service: Podiatry;  Laterality: Left;   CARDIOVERSION N/A 07/28/2016   Procedure: CARDIOVERSION;  Surgeon: Wellington Hampshire, MD;  Location: [facility] ORS;  Service: Cardiovascular;  Laterality: N/A;   CARDIOVERSION N/A 08/07/2016   Procedure: CARDIOVERSION;  Surgeon: Wellington Hampshire, MD;  Location: [facility] ORS;  Service: Cardiovascular;  Laterality: N/A;   CARDIOVERSION N/A 01/03/2019   Procedure: CARDIOVERSION;  Surgeon: Minna Merritts, MD;  Location: [facility] ORS;  Service: Cardiovascular;  Laterality: N/A;   CAROTID ENDARTERECTOMY     [facility]; Dr. Lucky Cowboy   CHOLECYSTECTOMY     ESOPHAGOGASTRODUODENOSCOPY (EGD) WITH PROPOFOL N/A 01/25/2019   Procedure: ESOPHAGOGASTRODUODENOSCOPY (EGD) WITH PROPOFOL;  Surgeon: Virgel Manifold, MD;  Location: [facility] ENDOSCOPY;  Service: Endoscopy;  Laterality: N/A;   foot infection Right    LEG SURGERY     right;infection   RIGHT/LEFT HEART CATH AND CORONARY ANGIOGRAPHY Bilateral 03/12/2017   Procedure: RIGHT/LEFT HEART CATH AND CORONARY ANGIOGRAPHY;  Surgeon: Wellington Hampshire, MD;  Location: [place] [facility] LAB;  Service: Cardiovascular;  Laterality: Bilateral;   TONSILLECTOMY        Social History:      Social History   Tobacco Use   Smoking status: Former Smoker    Packs/day: 0.50    Years: 5.00    Pack years: 2.50    Types: Cigarettes   Smokeless tobacco: Never Used  Substance Use Topics   Alcohol use: No       Family History :     Family History  Problem Relation Age of Onset   Heart disease Mother    Diabetes Mother    Heart disease Father    Hypertension Father    Diabetes Maternal Grandmother        Home Medications:   Prior to Admission medications   Medication Sig Start Date End Date Taking? Authorizing  Provider  apixaban (ELIQUIS) 5 MG TABS tablet Take 1 tablet (5 mg total) by mouth 2 (two) times daily. 02/20/19  Yes Theora Gianotti, NP  atorvastatin (LIPITOR) 40 MG tablet Take 1 tablet (40 mg total) by mouth daily. 02/20/19  Yes Theora Gianotti, NP  diclofenac Sodium (VOLTAREN) 1 % GEL Apply 2 g topically 3 (three) times daily as needed (left knee pain). 03/04/19  Yes Fritzi Mandes, MD  Insulin Degludec 200 UNIT/ML SOPN Inject 25 Units into the  skin daily at 12 noon. Dr Honor Junes ([id number]) 02/18/18  Yes [provider]  levothyroxine (SYNTHROID) 50 MCG tablet Take 50 mcg by mouth daily before breakfast.   Yes [provider]  losartan (COZAAR) 25 MG tablet Take 0.5 tablets (12.5 mg total) by mouth daily. 02/20/19  Yes Theora Gianotti, NP  metoprolol tartrate (LOPRESSOR) 25 MG tablet Take 1 tablet (25 mg total) by mouth 2 (two) times daily. 02/20/19  Yes Theora Gianotti, NP  ondansetron (ZOFRAN) 4 MG tablet Take 1 tablet (4 mg total) by mouth every 8 (eight) hours as needed for nausea or vomiting. 02/20/19  Yes Theora Gianotti, NP  oxyCODONE-acetaminophen (PERCOCET/ROXICET) 5-325 MG tablet Take 1 tablet by mouth every 8 (eight) hours as needed for severe pain. 03/04/19  Yes Fritzi Mandes, MD  pantoprazole (PROTONIX) 40 MG tablet Take 1 tablet (40 mg total) by mouth daily. 02/08/19  Yes Mayo, Pete Pelt, MD  torsemide (DEMADEX) 20 MG tablet Take 1 tablet (20 mg total) by mouth 2 (two) times daily. 03/04/19  Yes Fritzi Mandes, MD  triamcinolone cream (KENALOG) 0.5 % Apply 1 application topically 2 (two) times daily as needed (psoriasis).    Yes [provider]     Allergies:     Allergies  Allergen Reactions   Spironolactone     Hyperkalemia      Physical Exam:   Vitals  Blood pressure 129/68, pulse 64, temperature 98.5 F (36.9 C), temperature source Axillary, resp. rate (!) 27, height 5\' 7"  (1.702 m), weight 119 kg,  SpO2 95 %.  1.  General: sleeping  2. Psychiatric: euthymic  3. Neurologic: cn2-12 intact, reflexes 2+ symmetric, diffuse with no clonus, motor 5/5 to painful stimuli  4. HEENMT:  Anicteric, pupils 1.52mm symmetric, direct, consensual, near intact Neck: no jvd  5. Respiratory : CTAB anteriorly,  6. Cardiovascular : rrr s1, s2, no m/g/r,   7. Gastrointestinal:  Abd: soft, nt, nd, +bs  8. Skin: Ext: no c/c/e, no rash, negative cullens sign  9.Musculoskeletal:  Good ROM    Data Review:    CBC Recent Labs  Lab 02/28/19 1630 02/11/2019 1810  WBC 6.8 13.0*  HGB 9.5* 9.9*  HCT 30.0* 34.2*  PLT 281 300  MCV 80.6 88.8  MCH 25.5* 25.7*  MCHC 31.7 28.9*  RDW 21.5* 22.6*  LYMPHSABS 0.5* 0.5*  MONOABS 0.8 0.8  EOSABS 0.2 0.0  BASOSABS 0.1 0.1   ------------------------------------------------------------------------------------------------------------------  Results for orders placed or performed during the hospital encounter of 03/03/2019 (from the past 48 hour(s))  CBC with Differential     Status: Abnormal   Collection Time: 03/08/2019  6:10 PM  Result Value Ref Range   WBC 13.0 (H) 4.0 - 10.5 K/uL   RBC 3.85 (L) 3.87 - 5.11 MIL/uL   Hemoglobin 9.9 (L) 12.0 - 15.0 g/dL   HCT 34.2 (L) 36.0 - 46.0 %   MCV 88.8 80.0 - 100.0 fL   MCH 25.7 (L) 26.0 - 34.0 pg   MCHC 28.9 (L) 30.0 - 36.0 g/dL   RDW 22.6 (H) 11.5 - 15.5 %   Platelets 300 150 - 400 K/uL   nRBC 0.2 0.0 - 0.2 %   Neutrophils Relative % 86 %   Neutro Abs 11.2 (H) 1.7 - 7.7 K/uL   Lymphocytes Relative 4 %   Lymphs Abs 0.5 (L) 0.7 - 4.0 K/uL   Monocytes Relative 6 %   Monocytes Absolute 0.8 0.1 - 1.0 K/uL   Eosinophils Relative  0 %   Eosinophils Absolute 0.0 0.0 - 0.5 K/uL   Basophils Relative 1 %   Basophils Absolute 0.1 0.0 - 0.1 K/uL   WBC Morphology MILD LEFT SHIFT (1-5% METAS, OCC MYELO, OCC BANDS)    Smear Review Normal platelet morphology    Immature Granulocytes 3 %   Abs Immature Granulocytes  0.42 (H) 0.00 - 0.07 K/uL   Acanthocytes PRESENT    Polychromasia PRESENT    Ovalocytes PRESENT     Comment: Performed at [facility], [address], [place], [place] [address]  Ethanol     Status: None   Collection Time: 02/14/2019  6:53 PM  Result Value Ref Range   Alcohol, Ethyl (B) <10 <10 mg/dL    Comment: (NOTE) Lowest detectable limit for serum alcohol is 10 mg/dL. For medical purposes only. Performed at [facility], [address] [place], [place] [address]   Protime-INR     Status: Abnormal   Collection Time: 02/27/2019  6:53 PM  Result Value Ref Range   Prothrombin Time 33.1 (H) 11.4 - 15.2 seconds   INR 3.3 (H) 0.8 - 1.2    Comment: (NOTE) INR goal varies based on device and disease states. Performed at [facility], [address] [place], [place] [address]   APTT     Status: Abnormal   Collection Time: 02/27/2019  6:53 PM  Result Value Ref Range   aPTT 50 (H) 24 - 36 seconds    Comment:        IF BASELINE aPTT IS ELEVATED, SUGGEST PATIENT RISK ASSESSMENT BE USED TO DETERMINE APPROPRIATE ANTICOAGULANT THERAPY. Performed at [facility], [address] [place], [place] [address]   Comprehensive metabolic panel     Status: Abnormal   Collection Time: 02/19/2019  6:53 PM  Result Value Ref Range   Sodium 136 135 - 145 mmol/L   Potassium 5.7 (H) 3.5 - 5.1 mmol/L   Chloride 103 98 - 111 mmol/L   CO2 18 (L) 22 - 32 mmol/L   Glucose, Bld 183 (H) 70 - 99 mg/dL   BUN 88 (H) 8 - 23 mg/dL   Creatinine, Ser 3.59 (H) 0.44 - 1.00 mg/dL   Calcium 8.7 (L) 8.9 - 10.3 mg/dL   Total Protein 8.6 (H) 6.5 - 8.1 g/dL   Albumin 2.7 (L) 3.5 - 5.0 g/dL   AST 38 15 - 41 U/L   ALT 21 0 - 44 U/L   Alkaline Phosphatase 107 38 - 126 U/L   Total Bilirubin 2.7 (H) 0.3 - 1.2 mg/dL   GFR calc non Af Amer 12 (L) >60 mL/min   GFR calc Af Amer 14 (L) >60 mL/min   Anion gap 15 5 - 15    Comment: Performed at [facility], [address] [place], [place] [address]  Lipase, blood     Status: Abnormal   Collection Time: 02/19/2019  6:53 PM  Result Value Ref Range   Lipase 1,320 (H) 11 - 51 U/L    Comment: RESULT CONFIRMED BY MANUAL DILUTION Performed at [facility], [address] [place], [place] [address]   Troponin I (High Sensitivity)     Status: Abnormal   Collection Time: 02/19/2019  6:53 PM  Result Value Ref Range   Troponin I (High Sensitivity) 66 (H) <18 ng/L    Comment: (NOTE) Elevated high sensitivity troponin I (hsTnI) values and significant  changes across serial measurements may suggest ACS but many other  chronic and acute conditions are known to elevate hsTnI results.  Refer to the "Links" section for chest pain algorithms and additional  guidance. Performed at [facility], [address] [place], [place] [address]   Urine Drug Screen, Qualitative     Status: Abnormal   Collection Time: 02/28/2019  9:20 PM  Result Value Ref Range   Tricyclic, Ur Screen NONE DETECTED NONE DETECTED   Amphetamines, Ur Screen NONE DETECTED NONE DETECTED   MDMA (Ecstasy)Ur Screen NONE DETECTED NONE DETECTED   Cocaine Metabolite,Ur [place] NONE DETECTED NONE DETECTED   Opiate, Ur Screen POSITIVE (A) NONE DETECTED   Phencyclidine (PCP) Ur S NONE DETECTED NONE DETECTED   Cannabinoid 50 Ng, Ur  NONE DETECTED NONE DETECTED   Barbiturates, Ur Screen NONE DETECTED NONE DETECTED   Benzodiazepine, Ur Scrn NONE DETECTED NONE DETECTED   Methadone Scn, Ur NONE DETECTED NONE DETECTED    Comment: (NOTE) Tricyclics + metabolites, urine    Cutoff 1000 ng/mL Amphetamines + metabolites, urine  Cutoff 1000 ng/mL MDMA (Ecstasy), urine              Cutoff 500 ng/mL Cocaine Metabolite, urine          Cutoff 300 ng/mL Opiate + metabolites, urine        Cutoff 300 ng/mL Phencyclidine (PCP), urine         Cutoff 25 ng/mL Cannabinoid, urine                 Cutoff 50 ng/mL Barbiturates + metabolites, urine  Cutoff 200  ng/mL Benzodiazepine, urine              Cutoff 200 ng/mL Methadone, urine                   Cutoff 300 ng/mL The urine drug screen provides only a preliminary, unconfirmed analytical test result and should not be used for non-medical purposes. Clinical consideration and professional judgment should be applied to any positive drug screen result due to possible interfering substances. A more specific alternate chemical method must be used in order to obtain a confirmed analytical result. Gas chromatography / mass spectrometry (GC/MS) is the preferred confirmat ory method. Performed at [facility], [address] [place], [place] [address]   Urinalysis, Routine w reflex microscopic     Status: Abnormal   Collection Time: 03/07/2019  9:20 PM  Result Value Ref Range   Color, Urine AMBER (A) YELLOW    Comment: BIOCHEMICALS MAY BE AFFECTED BY COLOR   APPearance CLOUDY (A) CLEAR   Specific Gravity, Urine 1.019 1.005 - 1.030   pH 5.0 5.0 - 8.0   Glucose, UA NEGATIVE NEGATIVE mg/dL   Hgb urine dipstick MODERATE (A) NEGATIVE   Bilirubin Urine NEGATIVE NEGATIVE   Ketones, ur NEGATIVE NEGATIVE mg/dL   Protein, ur 30 (A) NEGATIVE mg/dL   Nitrite NEGATIVE NEGATIVE   Leukocytes,Ua MODERATE (A) NEGATIVE   RBC / HPF 6-10 0 - 5 RBC/hpf   WBC, UA 21-50 0 - 5 WBC/hpf   Bacteria, UA MANY (A) NONE SEEN   Squamous Epithelial / LPF 21-50 0 - 5   WBC Clumps PRESENT    Hyaline Casts, UA PRESENT    Granular Casts, UA PRESENT    Non Squamous Epithelial PRESENT (A) NONE SEEN    Comment: Performed at [facility], [address], [place], [place] [address]  Troponin I (High Sensitivity)     Status: Abnormal   Collection Time: 03/06/2019  9:20 PM  Result Value Ref Range   Troponin I (High Sensitivity) 102 (HH) <18 ng/L    Comment: CRITICAL RESULT CALLED TO, READ BACK BY AND VERIFIED WITH JESSICA COLTRANE 03/07/2019 @ 2227 MLK (NOTE) Elevated high sensitivity troponin I (hsTnI) values  and significant  changes across serial measurements may suggest ACS but many other  chronic and acute conditions are known to elevate hsTnI results.  Refer to the "Links" section for chest pain algorithms and additional  guidance. Performed at [facility], [address] [place], [place] [address]   Lactic acid, plasma     Status: Abnormal   Collection Time: 03/02/2019  9:20 PM  Result Value Ref Range   Lactic Acid, Venous 2.3 (HH) 0.5 - 1.9 mmol/L    Comment: CRITICAL RESULT CALLED TO, READ BACK BY AND VERIFIED WITH JENNIFER WHITLEY 02/21/2019 @ 2146  [place] Performed at [facility], [address] [place], [place] [address]     Chemistries  Recent Labs  Lab 02/28/19 1133 02/28/19 1630 03/01/19 0510 03/02/19 0549 03/03/19 0552 03/04/19 0502 02/26/2019 1853  NA 130* 131* 133* 131* 133* 137 136  K 4.1 4.6 4.1 4.4 4.7 4.4 5.7*  CL 95* 98 100 98 102 105 103  CO2 20* 19* 21* 22 20* 24 18*  GLUCOSE 75 139* 81 144* 169* 132* 183*  BUN 98* 94* 96* 96* 85* 76* 88*  CREATININE 3.96* 4.00* 3.61* 3.35* 2.64* 2.19* 3.59*  CALCIUM 8.1* 8.4* 8.3* 8.1* 8.3* 8.4* 8.7*  AST 27 26  --   --   --   --  38  ALT 17 17  --   --   --   --  21  ALKPHOS 111 122  --   --   --   --  107  BILITOT 1.6* 1.7*  --   --   --   --  2.7*   ------------------------------------------------------------------------------------------------------------------  ------------------------------------------------------------------------------------------------------------------ GFR: Estimated Creatinine Clearance: 19.5 mL/min (A) (by C-G formula based on SCr of 3.59 mg/dL (H)). Liver Function Tests: Recent Labs  Lab 02/28/19 1133 02/28/19 1630 02/11/2019 1853  AST 27 26 38  ALT 17 17 21   ALKPHOS 111 122 107  BILITOT 1.6* 1.7* 2.7*  PROT 8.4* 8.5* 8.6*  ALBUMIN 2.8* 2.8* 2.7*   Recent Labs  Lab 03/08/2019 1853  LIPASE 1,320*   No results for input(s): AMMONIA in the last 168  hours. Coagulation Profile: Recent Labs  Lab 02/23/2019 1853  INR 3.3*   Cardiac Enzymes: No results for input(s): CKTOTAL, CKMB, CKMBINDEX, TROPONINI in the last 168 hours. BNP (last 3 results) No results for input(s): PROBNP in the last 8760 hours. HbA1C: No results for input(s): HGBA1C in the last 72 hours. CBG: Recent Labs  Lab 03/03/19 1227 03/03/19 1616 03/03/19 2125 03/04/19 0759 03/04/19 1132  GLUCAP 208* 244* 200* 122* 225*   Lipid Profile: No results for input(s): CHOL, HDL, LDLCALC, TRIG, CHOLHDL, LDLDIRECT in the last 72 hours. Thyroid Function Tests: No results for input(s): TSH, T4TOTAL, FREET4, T3FREE, THYROIDAB in the last 72 hours. Anemia Panel: No results for input(s): VITAMINB12, FOLATE, FERRITIN, TIBC, IRON, RETICCTPCT in the last 72 hours.  --------------------------------------------------------------------------------------------------------------- Urine analysis:    Component Value Date/Time   COLORURINE AMBER (A) 02/15/2019 2120   APPEARANCEUR CLOUDY (A) 02/13/2019 2120   APPEARANCEUR Cloudy 06/29/2013 0318   LABSPEC 1.019 03/08/2019 2120   LABSPEC 1.030 06/29/2013 0318   PHURINE 5.0 02/18/2019 2120   GLUCOSEU NEGATIVE 02/28/2019 2120  GLUCOSEU >=500 06/29/2013 0318   HGBUR MODERATE (A) 02/09/2019 2120   BILIRUBINUR NEGATIVE 03/04/2019 2120   BILIRUBINUR Negative 06/29/2013 [place] 02/26/2019 2120   PROTEINUR 30 (A) 03/06/2019 2120   NITRITE NEGATIVE 02/21/2019 2120   LEUKOCYTESUR MODERATE (A) 02/24/2019 2120   LEUKOCYTESUR 3+ 06/29/2013 0318      Imaging Results:    Ct Abdomen Pelvis Wo Contrast  Result Date: 03/07/2019 CLINICAL DATA:  Nausea and vomiting. Technologist notes state discharged yesterday, continued decline. Patient is supposed to enter hospice but ?the did not have any beds ?Marland Kitchen Hypoxia and hypotensive. EXAM: CT ABDOMEN AND PELVIS WITHOUT CONTRAST TECHNIQUE: Multidetector CT imaging of the abdomen and  pelvis was performed following the standard protocol without IV contrast. COMPARISON:  Abdominal CT 01/17/2019 FINDINGS: Lower chest: Cardiomegaly. Patchy bibasilar airspace disease, left greater than right. Small left pleural effusion. Hepatobiliary: No focal hepatic abnormality. Clips in the gallbladder fossa postcholecystectomy. No biliary dilatation. Pancreas: Progressive peripancreatic fat stranding from prior exam, now involving the entire pancreas. Small amount of peripancreatic free fluid. No organized peripancreatic collection. No ductal dilatation. Spleen: Normal in size without focal abnormality. Adrenals/Urinary Tract: No adrenal nodule. No hydronephrosis. Mild symmetric bilateral perinephric edema. Urinary bladder is physiologically distended. No bladder wall thickening. No urolithiasis. Stomach/Bowel: Nondistended stomach. Fluid within the duodenum with questionable wall thickening in the third and fourth portion. No bowel obstruction. Minor colonic diverticulosis without diverticulitis. Appendix not confidently visualized. No evidence of appendicitis. Vascular/Lymphatic: Aortic atherosclerosis. No aneurysm. Questionable prominence of the ovarian veins. Reproductive: Prominent sized left ovary, unchanged from prior. Quiescent right ovary. Uterus is unremarkable. Other: Small volume perihepatic ascites. Prominent body wall edema, slightly improved. Complex fat containing umbilical hernia. Musculoskeletal: Advanced degenerative change throughout spine. There are no acute or suspicious osseous abnormalities. IMPRESSION: 1. Peripancreatic fat stranding is increased since exam last month, suspicious for acute pancreatitis. Small amount of peripancreatic free fluid. No organized peripancreatic collection. 2. Fluid within the duodenum with questionable wall thickening in the third and fourth portion,, likely reactive. 3. Patchy bibasilar airspace disease, left greater than right, may be atelectasis or  pneumonia. Small left pleural effusion. 4. Similar perihepatic ascites to prior exam. 5. Additional chronic findings are stable from recent prior. Aortic Atherosclerosis (ICD10-I70.0). Electronically Signed   By: Keith Rake M.D.   On: 03/06/2019 21:15   Ct Head Code Stroke Wo Contrast  Result Date: 02/18/2019 CLINICAL DATA:  Code stroke. Additional history provided: Patient discharged yesterday with continued decline at home. Hypotensive and hypoxic, response to verbal stimuli Izora Gala or some questions appropriately. EXAM: CT HEAD WITHOUT CONTRAST TECHNIQUE: Contiguous axial images were obtained from the base of the skull through the vertex without intravenous contrast. COMPARISON:  Head CT 02/13/2012 FINDINGS: Brain: The examination is mildly motion degraded. No evidence of acute intracranial hemorrhage. No acute demarcated cortical infarction. No evidence of intracranial mass. No midline shift or extra-axial fluid collection. Redemonstrated predominantly white matter infarct within the high frontal lobe minimally involving the cortex. Additional ill-defined hypoattenuation within the cerebral white matter is nonspecific, but consistent with chronic small vessel ischemic disease. Mild generalized parenchymal atrophy. Vascular: No hyperdense vessel.  Atherosclerotic calcifications. Skull: Normal. Negative for fracture or focal lesion. Sinuses/Orbits: No significant paranasal sinus disease or mastoid effusion. These results were called by telephone at the time of interpretation on 02/13/2019 at 6:55 pm to provider [facility] , who verbally acknowledged these results. IMPRESSION: 1. Motion degraded examination. 2. No acute intracranial hemorrhage or acute  demarcated cortical infarction. 3. Redemonstrated chronic high left frontal lobe infarct predominantly involving the white matter. 4. Background of mild generalized parenchymal atrophy and chronic small vessel ischemic disease. Electronically Signed    By: Kellie Simmering DO   On: 02/23/2019 18:55       Assessment & Plan:    Principal Problem:   Left-sided weakness Active Problems:   Paroxysmal atrial fibrillation (HCC)   Hypertension   CKD (chronic kidney disease), stage III   Type 2 diabetes mellitus with stage 4 chronic kidney disease (HCC)   Pancreatitis Left sided weakness Check MRI brain CTA brain / neck not done due to CKD Check carotid ultrasound Check cardiac echo  Check hga1c, lipid Cont aspirin Cont Lipitor 40mg  po qhs PT/OT/ speech therapy  Please consult neurology in AM  Pafib Cont Eliquis pharmacy to dose Cont Metoprolol 25mg  po bid  H/o CAD/ CHF  Cont Lipitor as above Cont Metoprolol as above Cont Losartan 25mg  po qday Cont Torsemide 20mg  po qday  Acute pancreatitis Hydrate gently Check lipase in am  Acute lower uti/ ? Hcap CXR vanco iv pharmacy to dose Cefepime iv pharmacy to dose  Gerd Cont PPI  DM2 fsbs q4h, ISS  Hyperkalemia Calcium gluconate Sodium bicarbonate Kayexalate Check cmp in am   Please consult Palliative care for Hospice, in AM  DVT Prophylaxis-  SCD  AM Labs Ordered, also please review Full Orders  Family Communication: Admission, patients condition and plan of care including tests being ordered have been discussed with the patient  who indicate understanding and agree with the plan and Code Status.  Code Status:  DNR per daughter  Admission status: /Inpatient: Based on patients clinical presentation and evaluation of above clinical data, I have made determination that patient meets Inpatient criteria at this time. Pt will require iv hydration and iv abx, and has high risk of clinical deterioration, pt will require > 2nites stay.   Time spent in minutes : 70    Jani Gravel M.D on 02/15/2019 at 11:52 PM

## 2019-03-05 NOTE — ED Triage Notes (Signed)
Pt to EMS from home after being discharged yesterday.  Per family pt has continued to decline and they can not care for her at home.  Pt is suppose to enter Hospice care, "but they did not have any beds".  Per EMS pt is hypotensive and hypoxic.  Pt responds to verbal stimuli and answers some questions appropriately.

## 2019-03-05 NOTE — Telephone Encounter (Signed)
Spoke to patient's daughter again. Daughter is very nervous having the patient at home at this time with her yelling out and wanting to die. She feels she would rather bring the patient to the hospital and have things arranged from that point.

## 2019-03-05 NOTE — ED Notes (Signed)
Pt call light ringing, this tech to respond. Pt daughter states that pt has soiled her brief. Brief and linen changed by this tech and [place] EDT. Pt provided with 2 warm blankets & expresses no further concerns at this time

## 2019-03-05 NOTE — ED Provider Notes (Signed)
[facility] Emergency Department Provider Note ____________________________________________   I have reviewed the triage vital signs and the nursing notes.   HISTORY  Chief Complaint Left sided weakness  History limited by: Not Limited   HPI Meghan [name] is a 68 y.o. female who presents to the emergency department today because of concern for left facial and left arm weakness. Some of the history was obtained by family. The patient had been discharged from the hospital yesterday after an admission for weakness and AKI. She stated she felt okay after discharge yesterday. This morning however when she woke up she did notice some generalized weakness. This progressed throughout the day. It was then accompanied by nausea and dry heaving. The patient has had some abdominal pain. This afternoon family then noticed some left sided facial weakness as well as left upper extremity weakness. The patient has not had any fever.    Records reviewed. Per medical record review patient has a history of recent admission.   Past Medical History:  Diagnosis Date  . Carotid arterial disease ([place])    a. 02/2012 s/p L CEA.  . CKD stage G3b/A1, GFR 30-44 and albumin creatinine ratio <30 mg/g   . Coronary artery disease    a. 02/2012 [facility] MV: Anterior, anteroseptal, septal, apical infarct with mild peri-infarct ischemia, EF 41%- medically managed due to anemia; b. 03/2017 Cath: LM nl, LAD 99ost/p/m - faint L->L collaterals, RI 20ost, LCX 5m, RCA 20p, 24m-->Med Rx for chronic subtotal LAD dzs. Avoid R Radial access in future.  . Diabetes mellitus without complication ([place])    Type II  . HFrEF (heart failure with reduced ejection fraction) ([place])    a. 06/2013 Echo: EF 35-40%; b. 07/2016 Echo: EF 25-30%; c. 11/2016 Echo: EF 20-25%; d. 12/2018 Echo: EF 25-30%, diff HK; e. 12/2018 Echo: EF 25-30%.  . Hyperlipidemia   . Hypertension   . Ischemic cardiomyopathy    a. 06/2013 Echo: EF 35-40%, glob  HK; b. 07/2016 Echo: EF 25-30%, sev mid-apicalanteroseptal, ant, apical HK; c. 11/2016 Echo: EF 20-25%, antsept DK, Gr2 DD; d. 12/2018 Echo: EF 25-30%, diff HK.  . Morbid obesity ([place])   . Normocytic anemia   . Persistent atrial fibrillation (HCC)    a. CHA2DS2VASc = 7-->eliquis  . Stroke [facility]) 2013    Patient Active Problem List   Diagnosis Date Noted  . Weakness   . Acute kidney injury superimposed on CKD ([place]) 02/28/2019  . Procedure not carried out because of contraindication   . Melena   . Dilated cardiomyopathy ([place]) 12/15/2018  . Hypoglycemia due to type 2 diabetes mellitus ([place]) 12/15/2018  . Acute on chronic combined systolic and diastolic CHF (congestive heart failure) ([place]) 12/13/2018  . Sepsis ([place]) 03/30/2017  . Elevated TSH 12/29/2016  . Psoriasis 12/26/2016  . Obesity, Class III, BMI 40-49.9 (morbid obesity) ([place]) 12/26/2016  . Toe osteomyelitis, left ([place]) 09/21/2016  . CKD (chronic kidney disease), stage III 07/26/2016  . History of CVA (cerebrovascular accident) without residual deficits 09/03/2013  . Hyperlipidemia 09/03/2013  . Type 2 diabetes mellitus with stage 4 chronic kidney disease ([place]) 09/03/2013  . Hyperlipidemia due to type 2 diabetes mellitus ([place]) 09/03/2013  . Persistent atrial fibrillation 07/16/2013  . Paroxysmal atrial fibrillation ([place]) 07/16/2013  . Ischemic cardiomyopathy 07/16/2013  . Hypertension 07/16/2013  . Vomiting 06/29/2013  . Chronic combined systolic and diastolic HF (heart failure), NYHA class 4 ([place]) 04/12/2012  . Coronary atherosclerosis   . Dyspnea 03/01/2012  Past Surgical History:  Procedure Laterality Date  . AMPUTATION TOE Left 09/22/2016   Procedure: AMPUTATION TOE;  Surgeon: Albertine Patricia, DPM;  Location: [facility] ORS;  Service: Podiatry;  Laterality: Left;  . CARDIOVERSION N/A 07/28/2016   Procedure: CARDIOVERSION;  Surgeon: Wellington Hampshire, MD;  Location: [facility] ORS;  Service: Cardiovascular;  Laterality: N/A;  .  CARDIOVERSION N/A 08/07/2016   Procedure: CARDIOVERSION;  Surgeon: Wellington Hampshire, MD;  Location: [facility] ORS;  Service: Cardiovascular;  Laterality: N/A;  . CARDIOVERSION N/A 01/03/2019   Procedure: CARDIOVERSION;  Surgeon: Minna Merritts, MD;  Location: [facility] ORS;  Service: Cardiovascular;  Laterality: N/A;  . CAROTID ENDARTERECTOMY     [facility]; Dr. Lucky Cowboy  . CHOLECYSTECTOMY    . ESOPHAGOGASTRODUODENOSCOPY (EGD) WITH PROPOFOL N/A 01/25/2019   Procedure: ESOPHAGOGASTRODUODENOSCOPY (EGD) WITH PROPOFOL;  Surgeon: Virgel Manifold, MD;  Location: [facility] ENDOSCOPY;  Service: Endoscopy;  Laterality: N/A;  . foot infection Right   . LEG SURGERY     right;infection  . RIGHT/LEFT HEART CATH AND CORONARY ANGIOGRAPHY Bilateral 03/12/2017   Procedure: RIGHT/LEFT HEART CATH AND CORONARY ANGIOGRAPHY;  Surgeon: Wellington Hampshire, MD;  Location: [facility];  Service: Cardiovascular;  Laterality: Bilateral;  . TONSILLECTOMY      Prior to Admission medications   Medication Sig Start Date End Date Taking? Authorizing Provider  apixaban (ELIQUIS) 5 MG TABS tablet Take 1 tablet (5 mg total) by mouth 2 (two) times daily. 02/20/19   Theora Gianotti, NP  atorvastatin (LIPITOR) 40 MG tablet Take 1 tablet (40 mg total) by mouth daily. 02/20/19   Theora Gianotti, NP  diclofenac Sodium (VOLTAREN) 1 % GEL Apply 2 g topically 3 (three) times daily as needed (left knee pain). 03/04/19   Fritzi Mandes, MD  Insulin Degludec 200 UNIT/ML SOPN Inject 25 Units into the skin daily at 12 noon. Dr Honor Junes (1300) 02/18/18   [provider]  levothyroxine (SYNTHROID) 50 MCG tablet Take 50 mcg by mouth daily before breakfast.    [provider]  losartan (COZAAR) 25 MG tablet Take 0.5 tablets (12.5 mg total) by mouth daily. 02/20/19   Theora Gianotti, NP  metoprolol tartrate (LOPRESSOR) 25 MG tablet Take 1 tablet (25 mg total) by mouth 2 (two) times daily. 02/20/19   Theora Gianotti, NP  ondansetron (ZOFRAN) 4 MG tablet Take 1 tablet (4 mg total) by mouth every 8 (eight) hours as needed for nausea or vomiting. 02/20/19   Theora Gianotti, NP  oxyCODONE-acetaminophen (PERCOCET/ROXICET) 5-325 MG tablet Take 1 tablet by mouth every 8 (eight) hours as needed for severe pain. 03/04/19   Fritzi Mandes, MD  pantoprazole (PROTONIX) 40 MG tablet Take 1 tablet (40 mg total) by mouth daily. 02/08/19   Mayo, Pete Pelt, MD  torsemide (DEMADEX) 20 MG tablet Take 1 tablet (20 mg total) by mouth 2 (two) times daily. 03/04/19   Fritzi Mandes, MD  triamcinolone cream (KENALOG) 0.5 % Apply 1 application topically 2 (two) times daily as needed (psoriasis).     [provider]    Allergies Spironolactone  Family History  Problem Relation Age of Onset  . Heart disease Mother   . Diabetes Mother   . Heart disease Father   . Hypertension Father   . Diabetes Maternal Grandmother     Social History Social History   Tobacco Use  . Smoking status: Former Smoker    Packs/day: 0.50    Years: 5.00    Pack years:  2.50    Types: Cigarettes  . Smokeless tobacco: Never Used  Substance Use Topics  . Alcohol use: No  . Drug use: No    Review of Systems Constitutional: No fever/chills Eyes: No visual changes. ENT: No sore throat. Cardiovascular: Denies chest pain. Respiratory: Denies shortness of breath. Gastrointestinal: Positive for abdominal pain, nausea and vomiting.  Genitourinary: Negative for dysuria. Musculoskeletal: Negative for back pain. Skin: Negative for rash. Neurological: Positive for left facial and left upper extremity weakness  ____________________________________________   PHYSICAL EXAM:  VITAL SIGNS: ED Triage Vitals  Enc Vitals Group     BP 02/09/2019 1759 (!) 117/57     Pulse Rate 02/20/2019 1759 66     Resp 03/04/2019 1759 19     Temp 02/14/2019 1759 98.5 F (36.9 C)     Temp Source 02/11/2019 1759 Axillary     SpO2 03/04/2019  1759 95 %     Weight 02/10/2019 1800 262 lb 5.6 oz (119 kg)     Height 03/04/2019 1800 5\' 7"  (1.702 m)   Constitutional: Alert and oriented.  Eyes: Conjunctivae are normal.  ENT      Head: Normocephalic and atraumatic.      Nose: No congestion/rhinnorhea.      Mouth/Throat: Mucous membranes are moist.      Neck: No stridor. Hematological/Lymphatic/Immunilogical: No cervical lymphadenopathy. Cardiovascular: Irregularly irregular rhythm.  No murmurs, rubs, or gallops. Respiratory: Normal respiratory effort without tachypnea nor retractions. Breath sounds are clear and equal bilaterally. No wheezes/rales/rhonchi. Gastrointestinal: Soft and tender in the upper abdomen.  Genitourinary: Deferred Musculoskeletal: Normal range of motion in all extremities. No lower extremity edema. Neurologic:  Normal speech and language. No gross focal neurologic deficits are appreciated.  Skin:  Skin is warm, dry and intact. No rash noted. Psychiatric: Mood and affect are normal. Speech and behavior are normal. Patient exhibits appropriate insight and judgment.  ____________________________________________    LABS (pertinent positives/negatives)  Trop hs 66 Lipase 1320 CMP na 136, k 5.7, glu 183, cr 3.59 CBC wbc 13.0, hgb 9.9, plt 300 ____________________________________________   EKG  I, Nance Pear, attending physician, personally viewed and interpreted this EKG  EKG Time: 1756 Rate: 73 Rhythm: atrial fibrillation Axis: left axis deviation Intervals: qtc 467 QRS: narrow, q waves v1, III ST changes: no st elevation Impression: abnormal ekg  ____________________________________________    RADIOLOGY  CT head No acute abnormality  CT abd/pel Inflammation around the pancreas.  ____________________________________________   PROCEDURES  Procedures  CRITICAL CARE Performed by: Nance Pear   Total critical care time: 40 minutes  Critical care time was exclusive of  separately billable procedures and treating other patients.  Critical care was necessary to treat or prevent imminent or life-threatening deterioration.  Critical care was time spent personally by me on the following activities: development of treatment plan with patient and/or surrogate as well as nursing, discussions with consultants, evaluation of patient's response to treatment, examination of patient, obtaining history from patient or surrogate, ordering and performing treatments and interventions, ordering and review of laboratory studies, ordering and review of radiographic studies, pulse oximetry and re-evaluation of patient's condition.  ____________________________________________   INITIAL IMPRESSION / ASSESSMENT AND PLAN / ED COURSE  Pertinent labs & imaging results that were available during my care of the patient were reviewed by me and considered in my medical decision making (see chart for details).   Patient presented to the emergency department today because of concerns for weakness, nausea vomiting and left sided weakness.  Given the acute onset of the left facial and left upper extremity weakness code stroke was called.  She is not a TPA candidate secondary to anticoagulant use.  Neurologist did recommend a CT angio head and neck.  I did however have a discussion with the patient and family.  Kidney function is poor today.  Patient does have acute kidney injury.  Did discuss risk of IV contrast.  At this time family and patient feel comfortable deferring CT scan.  Did discuss that there are multiple derangements in the blood work that could explain weakness.  Patient's troponin is elevated.  Patient has acute kidney injury.  Additionally patient's lipase was found to be elevated.  This could certainly explain the abdominal pain nausea and vomiting.  CT scan did show findings consistent with acute pancreatitis.  Also showed possible pneumonia.  Discussed these findings with the  patient and family.  Will plan on admission.  ____________________________________________   FINAL CLINICAL IMPRESSION(S) / ED DIAGNOSES  Final diagnoses:  Acute pancreatitis, unspecified complication status, unspecified pancreatitis type  Dehydration  AKI (acute kidney injury) ([place])  Generalized weakness  Elevated troponin  Pneumonia due to infectious organism, unspecified laterality, unspecified part of lung  Left-sided weakness     Note: This dictation was prepared with Dragon dictation. Any transcriptional errors that result from this process are unintentional     Nance Pear, MD 02/18/2019 2142

## 2019-03-05 NOTE — ED Notes (Signed)
Date and time results received: 03/10/2019 10:07 PM    Test: Lactic  Critical Value: 2.3  Name of Provider Notified: Archie Balboa

## 2019-03-06 ENCOUNTER — Inpatient Hospital Stay: Payer: Medicare Other

## 2019-03-06 ENCOUNTER — Inpatient Hospital Stay: Admit: 2019-03-06 | Payer: Medicare Other

## 2019-03-06 DIAGNOSIS — E1122 Type 2 diabetes mellitus with diabetic chronic kidney disease: Secondary | ICD-10-CM | POA: Diagnosis not present

## 2019-03-06 DIAGNOSIS — R778 Other specified abnormalities of plasma proteins: Secondary | ICD-10-CM | POA: Diagnosis not present

## 2019-03-06 DIAGNOSIS — N179 Acute kidney failure, unspecified: Secondary | ICD-10-CM

## 2019-03-06 DIAGNOSIS — I639 Cerebral infarction, unspecified: Secondary | ICD-10-CM | POA: Diagnosis not present

## 2019-03-06 DIAGNOSIS — K859 Acute pancreatitis without necrosis or infection, unspecified: Secondary | ICD-10-CM | POA: Diagnosis not present

## 2019-03-06 DIAGNOSIS — N184 Chronic kidney disease, stage 4 (severe): Secondary | ICD-10-CM

## 2019-03-06 LAB — GLUCOSE, CAPILLARY
Glucose-Capillary: 147 mg/dL — ABNORMAL HIGH (ref 70–99)
Glucose-Capillary: 158 mg/dL — ABNORMAL HIGH (ref 70–99)

## 2019-03-06 LAB — CBC
HCT: 29.3 % — ABNORMAL LOW (ref 36.0–46.0)
Hemoglobin: 9 g/dL — ABNORMAL LOW (ref 12.0–15.0)
MCH: 25.9 pg — ABNORMAL LOW (ref 26.0–34.0)
MCHC: 30.7 g/dL (ref 30.0–36.0)
MCV: 84.4 fL (ref 80.0–100.0)
Platelets: 268 10*3/uL (ref 150–400)
RBC: 3.47 MIL/uL — ABNORMAL LOW (ref 3.87–5.11)
RDW: 22.2 % — ABNORMAL HIGH (ref 11.5–15.5)
WBC: 18.1 10*3/uL — ABNORMAL HIGH (ref 4.0–10.5)
nRBC: 0.2 % (ref 0.0–0.2)

## 2019-03-06 LAB — BRAIN NATRIURETIC PEPTIDE: B Natriuretic Peptide: 704 pg/mL — ABNORMAL HIGH (ref 0.0–100.0)

## 2019-03-06 LAB — COMPREHENSIVE METABOLIC PANEL
ALT: 22 U/L (ref 0–44)
AST: 37 U/L (ref 15–41)
Albumin: 2.8 g/dL — ABNORMAL LOW (ref 3.5–5.0)
Alkaline Phosphatase: 103 U/L (ref 38–126)
Anion gap: 10 (ref 5–15)
BUN: 91 mg/dL — ABNORMAL HIGH (ref 8–23)
CO2: 21 mmol/L — ABNORMAL LOW (ref 22–32)
Calcium: 8.3 mg/dL — ABNORMAL LOW (ref 8.9–10.3)
Chloride: 105 mmol/L (ref 98–111)
Creatinine, Ser: 3.62 mg/dL — ABNORMAL HIGH (ref 0.44–1.00)
GFR calc Af Amer: 14 mL/min — ABNORMAL LOW (ref >60)
GFR calc non Af Amer: 12 mL/min — ABNORMAL LOW (ref >60)
Glucose, Bld: 194 mg/dL — ABNORMAL HIGH (ref 70–99)
Potassium: 5.2 mmol/L — ABNORMAL HIGH (ref 3.5–5.1)
Sodium: 136 mmol/L (ref 135–145)
Total Bilirubin: 2.4 mg/dL — ABNORMAL HIGH (ref 0.3–1.2)
Total Protein: 8.4 g/dL — ABNORMAL HIGH (ref 6.5–8.1)

## 2019-03-06 LAB — HEMOGLOBIN A1C
Hgb A1c MFr Bld: 7 % — ABNORMAL HIGH (ref 4.8–5.6)
Mean Plasma Glucose: 154.2 mg/dL

## 2019-03-06 LAB — LIPID PANEL
Cholesterol: 51 mg/dL (ref 0–200)
HDL: 13 mg/dL — ABNORMAL LOW (ref >40)
LDL Cholesterol: 27 mg/dL (ref 0–99)
Total CHOL/HDL Ratio: 3.9 RATIO
Triglycerides: 55 mg/dL (ref <150)
VLDL: 11 mg/dL (ref 0–40)

## 2019-03-06 LAB — PROCALCITONIN: Procalcitonin: 0.28 ng/mL

## 2019-03-06 LAB — MRSA PCR SCREENING: MRSA by PCR: NEGATIVE

## 2019-03-06 LAB — SARS CORONAVIRUS 2 BY RT PCR (HOSPITAL ORDER, PERFORMED IN ~~LOC~~ HOSPITAL LAB): SARS Coronavirus 2: NEGATIVE

## 2019-03-06 LAB — LACTATE DEHYDROGENASE: LDH: 227 U/L — ABNORMAL HIGH (ref 98–192)

## 2019-03-06 LAB — LACTIC ACID, PLASMA: Lactic Acid, Venous: 1.9 mmol/L (ref 0.5–1.9)

## 2019-03-06 LAB — LIPASE, BLOOD: Lipase: 1191 U/L — ABNORMAL HIGH (ref 11–51)

## 2019-03-06 LAB — SARS CORONAVIRUS 2 (TAT 6-24 HRS): SARS Coronavirus 2: NEGATIVE

## 2019-03-06 MED ORDER — TORSEMIDE 20 MG PO TABS: 20.0000 mg | ORAL_TABLET | Freq: Two times a day (BID) | ORAL | Status: DC

## 2019-03-06 MED ORDER — PROCHLORPERAZINE EDISYLATE 10 MG/2ML IJ SOLN
10.0000 mg | Freq: Once | INTRAMUSCULAR | Status: AC
  Administered 2019-03-06: 10 mg via INTRAVENOUS
  Filled 2019-03-06: qty 2

## 2019-03-06 MED ORDER — SODIUM BICARBONATE 8.4 % IV SOLN: 50.0000 meq | Freq: Once | INTRAVENOUS | Status: DC

## 2019-03-06 MED ORDER — MORPHINE SULFATE (PF) 2 MG/ML IV SOLN
2.0000 mg | INTRAVENOUS | Status: DC | PRN
  Administered 2019-03-06 – 2019-03-07 (×4): 2 mg via INTRAVENOUS
  Filled 2019-03-06 (×5): qty 1

## 2019-03-06 MED ORDER — ATORVASTATIN CALCIUM 20 MG PO TABS: 40.0000 mg | ORAL_TABLET | Freq: Every day | ORAL | Status: DC

## 2019-03-06 MED ORDER — CALCIUM GLUCONATE-NACL 1-0.675 GM/50ML-% IV SOLN
1.0000 g | Freq: Once | INTRAVENOUS | Status: AC
  Administered 2019-03-06: 1000 mg via INTRAVENOUS
  Filled 2019-03-06: qty 50

## 2019-03-06 MED ORDER — APIXABAN 2.5 MG PO TABS
2.5000 mg | ORAL_TABLET | Freq: Once | ORAL | Status: DC
  Filled 2019-03-06: qty 1

## 2019-03-06 MED ORDER — SODIUM CHLORIDE 0.9 % IV SOLN
2.0000 g | INTRAVENOUS | Status: DC
  Filled 2019-03-06: qty 2

## 2019-03-06 MED ORDER — PANTOPRAZOLE SODIUM 40 MG PO TBEC: 40.0000 mg | DELAYED_RELEASE_TABLET | Freq: Every day | ORAL | Status: DC

## 2019-03-06 MED ORDER — SODIUM CHLORIDE 0.9 % IV SOLN
INTRAVENOUS | Status: DC
  Administered 2019-03-06: 03:00:00 via INTRAVENOUS

## 2019-03-06 MED ORDER — METOPROLOL TARTRATE 25 MG PO TABS: 25.0000 mg | ORAL_TABLET | Freq: Two times a day (BID) | ORAL | Status: DC

## 2019-03-06 MED ORDER — LEVOTHYROXINE SODIUM 50 MCG PO TABS: 50.0000 ug | ORAL_TABLET | Freq: Every day | ORAL | Status: DC

## 2019-03-06 MED ORDER — VANCOMYCIN HCL IN DEXTROSE 1-5 GM/200ML-% IV SOLN
1000.0000 mg | Freq: Once | INTRAVENOUS | Status: DC
  Filled 2019-03-06: qty 200

## 2019-03-06 MED ORDER — INSULIN ASPART 100 UNIT/ML ~~LOC~~ SOLN
0.0000 [IU] | SUBCUTANEOUS | Status: DC
  Administered 2019-03-06: 1 [IU] via SUBCUTANEOUS
  Filled 2019-03-06: qty 1

## 2019-03-06 MED ORDER — ONDANSETRON HCL 4 MG/2ML IJ SOLN
4.0000 mg | Freq: Four times a day (QID) | INTRAMUSCULAR | Status: DC | PRN
  Administered 2019-03-06 (×2): 4 mg via INTRAVENOUS
  Filled 2019-03-06 (×2): qty 2

## 2019-03-06 MED ORDER — SODIUM POLYSTYRENE SULFONATE 15 GM/60ML PO SUSP
30.0000 g | Freq: Once | ORAL | Status: AC
  Administered 2019-03-06: 30 g via RECTAL
  Filled 2019-03-06: qty 120

## 2019-03-06 MED ORDER — LORAZEPAM 2 MG/ML IJ SOLN: 1.0000 mg | INTRAMUSCULAR | Status: DC | PRN

## 2019-03-06 MED ORDER — LOSARTAN POTASSIUM 25 MG PO TABS: 12.5000 mg | ORAL_TABLET | Freq: Every day | ORAL | Status: DC

## 2019-03-06 NOTE — Progress Notes (Signed)
OT Cancellation Note  Patient Details Name: Meghan [name] MRN: [id number] DOB: 10-29-1950   Cancelled Treatment:    Reason Eval/Treat Not Completed: Other (comment)  OT order received and chart reviewed. Pt with N/V this date limiting tolerance for OT evaluation. Will f/u on next date as able/as pt can tolerate.   Gerrianne Scale, MS, OTR/L ascom [phone] or [phone] 03/06/19, 2:54 PM

## 2019-03-06 NOTE — ED Notes (Signed)
Pt to MRI with ED tech

## 2019-03-06 NOTE — Progress Notes (Signed)
[facility]  ROUNDING NOTE   Subjective:  Patient well-known to [place] from prior admission. We were previously seeing her for acute kidney injury in the setting of known chronic kidney disease stage IIIb. Upon discharge her creatinine was down to 2.19. However she has been readmitted with a BUN of 88 and creatinine of 3.59 upon admission. Renal parameters appeared a bit worse today with a BUN up to 91 with a creatinine of 3.62.  Potassium also slightly high at 5.2. She now appears to have an acute infarct of the right cerebellum.  Objective:  Vital signs in last 24 hours:  Temp:  [97.5 F (36.4 C)-98.5 F (36.9 C)] 97.6 F (36.4 C) (11/26 0738) Pulse Rate:  [64-95] 81 (11/26 0738) Resp:  [13-27] 18 (11/26 0738) BP: (92-165)/(57-88) 92/58 (11/26 0738) SpO2:  [93 %-100 %] 97 % (11/26 0738) Weight:  [119 kg-119.7 kg] 119.7 kg (11/26 0155)  Weight change:  Filed Weights   03/01/2019 1800 03/06/19 0155  Weight: 119 kg 119.7 kg    Intake/Output: I/O last 3 completed shifts: In: 1000 [IV Piggyback:1000] Out: 0    Intake/Output this shift:  No intake/output data recorded.  Physical Exam: General: No acute distress  Head: Normocephalic, atraumatic. Moist oral mucosal membranes  Eyes: Anicteric  Neck: Supple, trachea midline  Lungs:  Clear to auscultation, normal effort  Heart: S1S2 no rubs  Abdomen:  Soft, nontender, bowel sounds present  Extremities: Trace peripheral edema.  Neurologic: Lethargic, not following commands  Skin: No lesions       Basic Metabolic Panel: Recent Labs  Lab 03/02/19 0549 03/03/19 0552 03/04/19 0502 03/01/2019 1853 03/06/19 0215  NA 131* 133* 137 136 136  K 4.4 4.7 4.4 5.7* 5.2*  CL 98 102 105 103 105  CO2 22 20* 24 18* 21*  GLUCOSE 144* 169* 132* 183* 194*  BUN 96* 85* 76* 88* 91*  CREATININE 3.35* 2.64* 2.19* 3.59* 3.62*  CALCIUM 8.1* 8.3* 8.4* 8.7* 8.3*    Liver Function Tests: Recent Labs  Lab 02/28/19 1133  02/28/19 1630 02/20/2019 1853 03/06/19 0215  AST 27 26 38 37  ALT 17 17 21 22   ALKPHOS 111 122 107 103  BILITOT 1.6* 1.7* 2.7* 2.4*  PROT 8.4* 8.5* 8.6* 8.4*  ALBUMIN 2.8* 2.8* 2.7* 2.8*   Recent Labs  Lab 02/27/2019 1853 03/06/19 0215  LIPASE 1,320* 1,191*   No results for input(s): AMMONIA in the last 168 hours.  CBC: Recent Labs  Lab 02/28/19 1630 03/02/2019 1810 03/06/19 0215  WBC 6.8 13.0* 18.1*  NEUTROABS 5.1 11.2*  --   HGB 9.5* 9.9* 9.0*  HCT 30.0* 34.2* 29.3*  MCV 80.6 88.8 84.4  PLT 281 300 268    Cardiac Enzymes: No results for input(s): CKTOTAL, CKMB, CKMBINDEX, TROPONINI in the last 168 hours.  BNP: Invalid input(s): POCBNP  CBG: Recent Labs  Lab 03/03/19 2125 03/04/19 0759 03/04/19 1132 03/06/19 0511 03/06/19 0740  GLUCAP 200* 122* 225* 147* 158*    Microbiology: Results for orders placed or performed during the hospital encounter of 03/04/2019  SARS Coronavirus 2 by RT PCR (hospital order, performed in [facility] hospital lab) Nasopharyngeal Nasopharyngeal Swab     Status: None   Collection Time: 03/03/2019 12:20 AM   Specimen: Nasopharyngeal Swab  Result Value Ref Range Status   SARS Coronavirus 2 NEGATIVE NEGATIVE Final    Comment: (NOTE) SARS-CoV-2 target nucleic acids are NOT DETECTED. The SARS-CoV-2 RNA is generally detectable in upper and lower respiratory  specimens during the acute phase of infection. The lowest concentration of SARS-CoV-2 viral copies this assay can detect is 250 copies / mL. A negative result does not preclude SARS-CoV-2 infection and should not be used as the sole basis for treatment or other patient management decisions.  A negative result may occur with improper specimen collection / handling, submission of specimen other than nasopharyngeal swab, presence of viral mutation(s) within the areas targeted by this assay, and inadequate number of viral copies (<250 copies / mL). A negative result must be combined with  clinical observations, patient history, and epidemiological information. Fact Sheet for Patients:   StrictlyIdeas.no Fact Sheet for Healthcare Providers: BankingDealers.co.za This test is not yet approved or cleared  by the [place] FDA and has been authorized for detection and/or diagnosis of SARS-CoV-2 by FDA under an Emergency Use Authorization (EUA).  This EUA will remain in effect (meaning this test can be used) for the duration of the COVID-19 declaration under Section 564(b)(1) of the Act, 21 U.S.C. section 360bbb-3(b)(1), unless the authorization is terminated or revoked sooner. Performed at [facility], [address] [place], [place] [address]   Blood culture (routine x 2)     Status: None (Preliminary result)   Collection Time: 02/24/2019  9:20 PM   Specimen: BLOOD  Result Value Ref Range Status   Specimen Description BLOOD RIGHT ANTECUBITAL  Final   Special Requests   Final    BOTTLES DRAWN AEROBIC AND ANAEROBIC Blood Culture adequate volume   Culture   Final    NO GROWTH < 12 HOURS Performed at [facility], [address], [place], [place] [address]    Report Status PENDING  Incomplete  Blood culture (routine x 2)     Status: None (Preliminary result)   Collection Time: 03/02/2019  9:20 PM   Specimen: BLOOD  Result Value Ref Range Status   Specimen Description BLOOD RIGHT HAND  Final   Special Requests   Final    BOTTLES DRAWN AEROBIC AND ANAEROBIC Blood Culture adequate volume   Culture   Final    NO GROWTH < 12 HOURS Performed at [facility], [address], [place], [place] [address]    Report Status PENDING  Incomplete  SARS CORONAVIRUS 2 (TAT 6-24 HRS) Nasopharyngeal Nasopharyngeal Swab     Status: None   Collection Time: 02/10/2019  9:20 PM   Specimen: Nasopharyngeal Swab  Result Value Ref Range Status   SARS Coronavirus 2 NEGATIVE NEGATIVE Final    Comment:  (NOTE) SARS-CoV-2 target nucleic acids are NOT DETECTED. The SARS-CoV-2 RNA is generally detectable in upper and lower respiratory specimens during the acute phase of infection. Negative results do not preclude SARS-CoV-2 infection, do not rule out co-infections with other pathogens, and should not be used as the sole basis for treatment or other patient management decisions. Negative results must be combined with clinical observations, patient history, and epidemiological information. The expected result is Negative. Fact Sheet for Patients: SugarRoll.be Fact Sheet for Healthcare Providers: https://www.woods-mathews.com/ This test is not yet approved or cleared by the [place] FDA and  has been authorized for detection and/or diagnosis of SARS-CoV-2 by FDA under an Emergency Use Authorization (EUA). This EUA will remain  in effect (meaning this test can be used) for the duration of the COVID-19 declaration under Section 56 4(b)(1) of the Act, 21 U.S.C. section 360bbb-3(b)(1), unless the authorization is terminated or revoked sooner. Performed at [facility], [place]  [address], [place] [address]     Coagulation Studies: Recent Labs    03/08/2019 1853  LABPROT 33.1*  INR 3.3*    Urinalysis: Recent Labs    03/08/2019 2120  COLORURINE AMBER*  LABSPEC 1.019  PHURINE 5.0  GLUCOSEU NEGATIVE  HGBUR MODERATE*  BILIRUBINUR NEGATIVE  KETONESUR NEGATIVE  PROTEINUR 30*  NITRITE NEGATIVE  LEUKOCYTESUR MODERATE*      Imaging: Ct Abdomen Pelvis Wo Contrast  Result Date: 02/11/2019 CLINICAL DATA:  Nausea and vomiting. Technologist notes state discharged yesterday, continued decline. Patient is supposed to enter hospice but ?the did not have any beds ?Marland Kitchen Hypoxia and hypotensive. EXAM: CT ABDOMEN AND PELVIS WITHOUT CONTRAST TECHNIQUE: Multidetector CT imaging of the abdomen and pelvis was performed following the standard  protocol without IV contrast. COMPARISON:  Abdominal CT 01/17/2019 FINDINGS: Lower chest: Cardiomegaly. Patchy bibasilar airspace disease, left greater than right. Small left pleural effusion. Hepatobiliary: No focal hepatic abnormality. Clips in the gallbladder fossa postcholecystectomy. No biliary dilatation. Pancreas: Progressive peripancreatic fat stranding from prior exam, now involving the entire pancreas. Small amount of peripancreatic free fluid. No organized peripancreatic collection. No ductal dilatation. Spleen: Normal in size without focal abnormality. Adrenals/Urinary Tract: No adrenal nodule. No hydronephrosis. Mild symmetric bilateral perinephric edema. Urinary bladder is physiologically distended. No bladder wall thickening. No urolithiasis. Stomach/Bowel: Nondistended stomach. Fluid within the duodenum with questionable wall thickening in the third and fourth portion. No bowel obstruction. Minor colonic diverticulosis without diverticulitis. Appendix not confidently visualized. No evidence of appendicitis. Vascular/Lymphatic: Aortic atherosclerosis. No aneurysm. Questionable prominence of the ovarian veins. Reproductive: Prominent sized left ovary, unchanged from prior. Quiescent right ovary. Uterus is unremarkable. Other: Small volume perihepatic ascites. Prominent body wall edema, slightly improved. Complex fat containing umbilical hernia. Musculoskeletal: Advanced degenerative change throughout spine. There are no acute or suspicious osseous abnormalities. IMPRESSION: 1. Peripancreatic fat stranding is increased since exam last month, suspicious for acute pancreatitis. Small amount of peripancreatic free fluid. No organized peripancreatic collection. 2. Fluid within the duodenum with questionable wall thickening in the third and fourth portion,, likely reactive. 3. Patchy bibasilar airspace disease, left greater than right, may be atelectasis or pneumonia. Small left pleural effusion. 4. Similar  perihepatic ascites to prior exam. 5. Additional chronic findings are stable from recent prior. Aortic Atherosclerosis (ICD10-I70.0). Electronically Signed   By: Keith Rake M.D.   On: 02/13/2019 21:15   Mr Brain Wo Contrast  Result Date: 03/06/2019 CLINICAL DATA:  Encephalopathy. Left facial droop EXAM: MRI HEAD WITHOUT CONTRAST TECHNIQUE: Multiplanar, multiecho pulse sequences of the brain and surrounding structures were obtained without intravenous contrast. COMPARISON:  Head CT 02/23/2019 FINDINGS: BRAIN: There is a punctate acute infarct of the right cerebellum. Early confluent hyperintense T2-weighted signal of the periventricular and deep white matter, most commonly due to chronic ischemic microangiopathy. There is an old left frontal lobe infarct of the precentral gyrus. There is generalized atrophy without lobar predilection. The midline structures are normal. VASCULAR: The major intracranial arterial and venous sinus flow voids are normal. Susceptibility-sensitive sequences show no chronic microhemorrhage or superficial siderosis. SKULL AND UPPER CERVICAL SPINE: Calvarial bone marrow signal is normal. There is no skull base mass. The visualized upper cervical spine and soft tissues are normal. SINUSES/ORBITS: There are no fluid levels or advanced mucosal thickening. The mastoid air cells and middle ear cavities are free of fluid. The orbits are normal. IMPRESSION: 1. Punctate acute infarct of the right cerebellum. No hemorrhage or mass effect. 2. Old left frontal lobe infarct.  3. Moderate chronic small vessel disease. Electronically Signed   By: Ulyses Jarred M.D.   On: 03/06/2019 01:29   Dg Chest Port 1 View  Result Date: 03/06/2019 CLINICAL DATA:  Altered mental status EXAM: PORTABLE CHEST 1 VIEW COMPARISON:  Radiograph 01/25/2019 FINDINGS: Widespread hazy interstitial opacities throughout the lungs with indistinctness of the pulmonary vascularity, central cuffing, and cardiomegaly.  Suspect small left effusion with some septal thickening. No pneumothorax. No acute osseous or soft tissue abnormality. Degenerative changes are present in the imaged spine and shoulders. More advanced degenerative changes noted in the right shoulder compared to left. IMPRESSION: Features of CHF with left effusion, interstitial edema and cardiomegaly. More confluent opacities could reflect developing alveolar edema though infection could have a similar appearance. Electronically Signed   By: Lovena Le M.D.   On: 03/06/2019 00:03   Ct Head Code Stroke Wo Contrast  Result Date: 02/18/2019 CLINICAL DATA:  Code stroke. Additional history provided: Patient discharged yesterday with continued decline at home. Hypotensive and hypoxic, response to verbal stimuli Izora Gala or some questions appropriately. EXAM: CT HEAD WITHOUT CONTRAST TECHNIQUE: Contiguous axial images were obtained from the base of the skull through the vertex without intravenous contrast. COMPARISON:  Head CT 02/13/2012 FINDINGS: Brain: The examination is mildly motion degraded. No evidence of acute intracranial hemorrhage. No acute demarcated cortical infarction. No evidence of intracranial mass. No midline shift or extra-axial fluid collection. Redemonstrated predominantly white matter infarct within the high frontal lobe minimally involving the cortex. Additional ill-defined hypoattenuation within the cerebral white matter is nonspecific, but consistent with chronic small vessel ischemic disease. Mild generalized parenchymal atrophy. Vascular: No hyperdense vessel.  Atherosclerotic calcifications. Skull: Normal. Negative for fracture or focal lesion. Sinuses/Orbits: No significant paranasal sinus disease or mastoid effusion. These results were called by telephone at the time of interpretation on 02/23/2019 at 6:55 pm to provider [facility] , who verbally acknowledged these results. IMPRESSION: 1. Motion degraded examination. 2. No acute  intracranial hemorrhage or acute demarcated cortical infarction. 3. Redemonstrated chronic high left frontal lobe infarct predominantly involving the white matter. 4. Background of mild generalized parenchymal atrophy and chronic small vessel ischemic disease. Electronically Signed   By: Kellie Simmering DO   On: 02/13/2019 18:55     Medications:   . sodium chloride 50 mL/hr at 03/06/19 0254  . ceFEPime (MAXIPIME) IV    . vancomycin     .  stroke: mapping our early stages of recovery book   Does not apply Once  . apixaban  2.5 mg Oral Once  . atorvastatin  40 mg Oral q1800  . insulin aspart  0-6 Units Subcutaneous Q4H  . levothyroxine  50 mcg Oral QAC breakfast  . metoprolol tartrate  25 mg Oral BID  . pantoprazole  40 mg Oral Daily  . sodium bicarbonate  50 mEq Intravenous Once   acetaminophen **OR** acetaminophen (TYLENOL) oral liquid 160 mg/5 mL **OR** acetaminophen, HYDROmorphone (DILAUDID) injection, ondansetron (ZOFRAN) IV  Assessment/ Plan:  68 y.o. female with past medical history of chronic kidney disease stage IIIb, coronary disease, diabetes mellitus type 2, chronic systolic heart failure ejection fraction 25 to 30% who was admitted for right cerebellar infarct.  1.  Acute kidney injury/chronic kidney disease stage IIIb.  Patient's renal parameters worsened this admission.  Continue IV fluid hydration.  No urgent indication for dialysis at the moment however this may need to be considered if family continues to desire aggressive care.  However patient quite ill at the  moment with right cerebellar infarct.  2.  Chronic systolic heart failure.  Patient on IV fluid hydration for now.  Monitor for signs of worsening heart failure.  3.  Diabetes mellitus type 2 with chronic kidney disease.  Glycemic control as per hospitalist.  4.  Anemia of chronic kidney disease.  Hemoglobin down to 9.  However in the setting of an acute stroke hold off on Epogen at this time.   LOS: 1 Zayyan Mullen 11/26/202010:12 AM

## 2019-03-06 NOTE — ED Notes (Signed)
Pt lying in bed, eyes closed with equal unlabored RR,. Daughter at bedside. Pt is dry and clean with VS WNL. Will continue to monitor and assess pt but at this time nothing is needed from staff

## 2019-03-06 NOTE — Progress Notes (Signed)
PT Cancellation Note  Patient Details Name: Meghan [name] MRN: [id number] DOB: 07-Feb-1951   Cancelled Treatment:    Reason Eval/Treat Not Completed: Other (comment). Consult received and chart reviewed. Pt with multiple recent admissions. Pt lethargic upon arrival, however easily awakes. Started evaluation, then pt became very nauseated and vomiting uncontrollably. RN notified and therapist left room. WIll re-attempt tomorrow if pt feeling better. Of note, pt able to move all 4 extremities and reports weakness improved.   Nayelly Laughman 03/06/2019, 9:48 AM  Greggory Stallion, PT, DPT [phone]

## 2019-03-06 NOTE — Progress Notes (Addendum)
Pharmacy Antibiotic Note  Meghan [name] is a 68 y.o. female admitted on 02/20/2019 with pna and UTI.  Pharmacy has been consulted for Vancomycin and Cefepime dosing.    Plan: Pt has received Vancomycin 1000mg  + 1000mg  additional to make 2000mg  total (loading dose) and Cefepime 2000mg  x 1 dose in ED Pharmacy will monitor, f/u renal fxn (SCr) for further dosing  Height: 5\' 7"  (170.2 cm) Weight: 262 lb 5.6 oz (119 kg) IBW/kg (Calculated) : 61.6  Temp (24hrs), Avg:98.5 F (36.9 C), Min:98.5 F (36.9 C), Max:98.5 F (36.9 C)  Recent Labs  Lab 02/28/19 1630 03/01/19 0510 03/02/19 0549 03/03/19 0552 03/04/19 0502 02/13/2019 1810 02/26/2019 1853 03/04/2019 2120  WBC 6.8  --   --   --   --  13.0*  --   --   CREATININE 4.00* 3.61* 3.35* 2.64* 2.19*  --  3.59*  --   LATICACIDVEN  --   --   --   --   --   --   --  2.3*    Estimated Creatinine Clearance: 19.5 mL/min (A) (by C-G formula based on SCr of 3.59 mg/dL (H)).    Allergies  Allergen Reactions  . Spironolactone     Hyperkalemia    Antimicrobials this admission: Cefepime 11/25 >>  Vancomycin 11/25 >>   Dose adjustments this admission:   Microbiology results:  BCx:   UCx:    Sputum:    MRSA PCR:   Thank you for allowing pharmacy to be a part of this patient's care.  Hart Robinsons A 03/06/2019 1:50 AM

## 2019-03-06 NOTE — ED Notes (Signed)
Pt back from MRI 

## 2019-03-06 NOTE — Progress Notes (Signed)
[facility] [place] NAME: Meghan [name]    MR#:  [id number]  DATE OF BIRTH:  Jun 24, 1950  SUBJECTIVE:  patient came in with left sided weakness, epigastric /political abdominal pain and severe dry heaving [name] is in the room. Patient is moaning with pain. She is restless and not able to get comfortable with dry heaving. Unable to communicate much due to dry heaves REVIEW OF SYSTEMS:   Review of Systems  Unable to perform ROS: Mental acuity   Tolerating Diet: no because of dry heaves Tolerating PT: bedbound  DRUG ALLERGIES:   Allergies  Allergen Reactions  . Spironolactone     Hyperkalemia     VITALS:  Blood pressure 140/90, pulse 79, temperature 97.6 F (36.4 C), temperature source Oral, resp. rate 18, height 5\' 7"  (1.702 m), weight 119.7 kg, SpO2 97 %.  PHYSICAL EXAMINATION:   Physical Exam  GENERAL:  68 y.o.-year-old patient lying in the bed with moderate acute distress due to dry heaves and abdominal pain appears critically ill morbid obesity EYES: Pupils equal, round, reactive to light and accommodation. No scleral icterus. HEENT: Head atraumatic, normocephalic. Oropharynx and nasopharynx clear. Dry oral mucosa. NECK:  Supple, no jugular venous distention. No thyroid enlargement, no tenderness.  LUNGS: shallow breath sounds bilaterally, no wheezing, rales, rhonchi. No use of accessory muscles of respiration. No respiratory distress CARDIOVASCULAR: S1, S2 normal. No murmurs, rubs, or gallops.  ABDOMEN: Soft, diffuse generalized tenderness, nondistended. ubaBowel sounds present. No organomegaly or mass.  EXTREMITIES: No cyanosis, clubbing or edema b/l.    NEUROLOGIC:unable to assess --pt in pain and dry heaving PSYCHIATRIC:  Patient awake with intermittent moaning SKIN: per RN  LABORATORY PANEL:  CBC Recent Labs  Lab 03/06/19 0215  WBC 18.1*  HGB 9.0*  HCT 29.3*  PLT 268    Chemistries  Recent Labs  Lab  03/06/19 0215  NA 136  K 5.2*  CL 105  CO2 21*  GLUCOSE 194*  BUN 91*  CREATININE 3.62*  CALCIUM 8.3*  AST 37  ALT 22  ALKPHOS 103  BILITOT 2.4*   Cardiac Enzymes No results for input(s): TROPONINI in the last 168 hours. RADIOLOGY:  Ct Abdomen Pelvis Wo Contrast  Result Date: 02/25/2019 CLINICAL DATA:  Nausea and vomiting. Technologist notes state discharged yesterday, continued decline. Patient is supposed to enter hospice but ?the did not have any beds ?Marland Kitchen Hypoxia and hypotensive. EXAM: CT ABDOMEN AND PELVIS WITHOUT CONTRAST TECHNIQUE: Multidetector CT imaging of the abdomen and pelvis was performed following the standard protocol without IV contrast. COMPARISON:  Abdominal CT 01/17/2019 FINDINGS: Lower chest: Cardiomegaly. Patchy bibasilar airspace disease, left greater than right. Small left pleural effusion. Hepatobiliary: No focal hepatic abnormality. Clips in the gallbladder fossa postcholecystectomy. No biliary dilatation. Pancreas: Progressive peripancreatic fat stranding from prior exam, now involving the entire pancreas. Small amount of peripancreatic free fluid. No organized peripancreatic collection. No ductal dilatation. Spleen: Normal in size without focal abnormality. Adrenals/Urinary Tract: No adrenal nodule. No hydronephrosis. Mild symmetric bilateral perinephric edema. Urinary bladder is physiologically distended. No bladder wall thickening. No urolithiasis. Stomach/Bowel: Nondistended stomach. Fluid within the duodenum with questionable wall thickening in the third and fourth portion. No bowel obstruction. Minor colonic diverticulosis without diverticulitis. Appendix not confidently visualized. No evidence of appendicitis. Vascular/Lymphatic: Aortic atherosclerosis. No aneurysm. Questionable prominence of the ovarian veins. Reproductive: Prominent sized left ovary, unchanged from prior. Quiescent right ovary. Uterus is unremarkable. Other: Small volume perihepatic ascites.  Prominent body wall edema, slightly improved. Complex fat containing umbilical hernia. Musculoskeletal: Advanced degenerative change throughout spine. There are no acute or suspicious osseous abnormalities. IMPRESSION: 1. Peripancreatic fat stranding is increased since exam last month, suspicious for acute pancreatitis. Small amount of peripancreatic free fluid. No organized peripancreatic collection. 2. Fluid within the duodenum with questionable wall thickening in the third and fourth portion,, likely reactive. 3. Patchy bibasilar airspace disease, left greater than right, may be atelectasis or pneumonia. Small left pleural effusion. 4. Similar perihepatic ascites to prior exam. 5. Additional chronic findings are stable from recent prior. Aortic Atherosclerosis (ICD10-I70.0). Electronically Signed   By: Keith Rake M.D.   On: 02/19/2019 21:15   Mr Brain Wo Contrast  Result Date: 03/06/2019 CLINICAL DATA:  Encephalopathy. Left facial droop EXAM: MRI HEAD WITHOUT CONTRAST TECHNIQUE: Multiplanar, multiecho pulse sequences of the brain and surrounding structures were obtained without intravenous contrast. COMPARISON:  Head CT 02/13/2019 FINDINGS: BRAIN: There is a punctate acute infarct of the right cerebellum. Early confluent hyperintense T2-weighted signal of the periventricular and deep white matter, most commonly due to chronic ischemic microangiopathy. There is an old left frontal lobe infarct of the precentral gyrus. There is generalized atrophy without lobar predilection. The midline structures are normal. VASCULAR: The major intracranial arterial and venous sinus flow voids are normal. Susceptibility-sensitive sequences show no chronic microhemorrhage or superficial siderosis. SKULL AND UPPER CERVICAL SPINE: Calvarial bone marrow signal is normal. There is no skull base mass. The visualized upper cervical spine and soft tissues are normal. SINUSES/ORBITS: There are no fluid levels or advanced mucosal  thickening. The mastoid air cells and middle ear cavities are free of fluid. The orbits are normal. IMPRESSION: 1. Punctate acute infarct of the right cerebellum. No hemorrhage or mass effect. 2. Old left frontal lobe infarct. 3. Moderate chronic small vessel disease. Electronically Signed   By: Ulyses Jarred M.D.   On: 03/06/2019 01:29   Dg Chest Port 1 View  Result Date: 03/06/2019 CLINICAL DATA:  Altered mental status EXAM: PORTABLE CHEST 1 VIEW COMPARISON:  Radiograph 01/25/2019 FINDINGS: Widespread hazy interstitial opacities throughout the lungs with indistinctness of the pulmonary vascularity, central cuffing, and cardiomegaly. Suspect small left effusion with some septal thickening. No pneumothorax. No acute osseous or soft tissue abnormality. Degenerative changes are present in the imaged spine and shoulders. More advanced degenerative changes noted in the right shoulder compared to left. IMPRESSION: Features of CHF with left effusion, interstitial edema and cardiomegaly. More confluent opacities could reflect developing alveolar edema though infection could have a similar appearance. Electronically Signed   By: Lovena Le M.D.   On: 03/06/2019 00:03   Ct Head Code Stroke Wo Contrast  Result Date: 02/24/2019 CLINICAL DATA:  Code stroke. Additional history provided: Patient discharged yesterday with continued decline at home. Hypotensive and hypoxic, response to verbal stimuli Meghan [name] or some questions appropriately. EXAM: CT HEAD WITHOUT CONTRAST TECHNIQUE: Contiguous axial images were obtained from the base of the skull through the vertex without intravenous contrast. COMPARISON:  Head CT 02/13/2012 FINDINGS: Brain: The examination is mildly motion degraded. No evidence of acute intracranial hemorrhage. No acute demarcated cortical infarction. No evidence of intracranial mass. No midline shift or extra-axial fluid collection. Redemonstrated predominantly white matter infarct within the high  frontal lobe minimally involving the cortex. Additional ill-defined hypoattenuation within the cerebral white matter is nonspecific, but consistent with chronic small vessel ischemic disease. Mild generalized parenchymal atrophy. Vascular: No hyperdense vessel.  Atherosclerotic calcifications. Skull: Normal. Negative  for fracture or focal lesion. Sinuses/Orbits: No significant paranasal sinus disease or mastoid effusion. These results were called by telephone at the time of interpretation on 02/09/2019 at 6:55 pm to provider [facility] , who verbally acknowledged these results. IMPRESSION: 1. Motion degraded examination. 2. No acute intracranial hemorrhage or acute demarcated cortical infarction. 3. Redemonstrated chronic high left frontal lobe infarct predominantly involving the white matter. 4. Background of mild generalized parenchymal atrophy and chronic small vessel ischemic disease. Electronically Signed   By: Kellie Simmering DO   On: 02/14/2019 18:55   ASSESSMENT AND PLAN:    Avanya Corsa  is a 68 y.o. female,  with h/o  hypertension, hyperlipidemia, Dm2, diabetic nephropathy, CKD stage 3, Pafib, CHF (EF 25%), CAD, carotid stenosis, anemia, apparently presents with c/o left sided weakness, and left facial droop starting at 5pm.  Pt presented to ED for code stroke not a candidate for TPA due to INR 3.3. It also had significant abdominal pain and dry heaving with vomiting.  1. Acute right cerebellar stroke with history of chronic atrial fibrillation on anticoagulation -patient presented with left sided weakness and facial droop as noted by family. -Workup in the ER showed MRI positive for right cerebellar stroke -patient was already on eliquis  2. Acute on chronic kidney disease stage IV with diabetic nephropathy and ATN in the setting of acute pancreatitis -baseline creatinine 2.1 -patient came in with creatinine of 3.59 intermittent hypotension -IV fluids were started  3. Acute  pancreatitis-moderate to severe with peri- pancreatic stranding -patient is having significant abdominal pain and dry heaving and moaning not able to stay comfortable -IV morphine 2 mg every two hours as needed along with IV Ativan and PRN Zofran for dry heaves and vomiting  4. Hyperkalemia due to acute on chronic kidney disease -received calcium gluconate, dextrose, bicarb in the ER -potassium is 4.2  5. Acute on chronic congestive heart failure systolic with severe cardiomyopathy EF of 25% per echo -BNP 704 -x-ray shows pulmonary edema -continue oxygen -PRN morphine for comfort -no evidence of pneumonia. Pro calcitonin .28  6. Lactic acidosis suspected due to ATN, pancreatitis  7. Type II diabetes with diabetic nephropathy  I had a very long discussion with patient's [name] who was in the room. I explained to Meghan [name] that given multiple comorbidities and presentation of stroke, pancreatitis, renal failure and congestive heart failure patient has a very poor/grave prognosis.  Meghan [name] reports she has ready spoken with her younger sister Meghan [name] and they want to keep patient comfortable and requesting comfort care. I confirmed with Meghan [name] that I will should comfort care orders with PRN morphine and Ativan. They also request patient when able to be transferred to hospice facility. She asked me about other family member visitation. I told her four family members can visit patient since she is comfort care.  Social worker informed about hospice home.   Family communication : Meghan [name] in the room Consults : nephrology Discharge Disposition : hospice facility CODE STATUS: DNR DVT Prophylaxis : none due to patient being comfort care  TOTAL TIME TAKING CARE OF THIS PATIENT: *35* minutes.  >50% time spent on counselling and coordination of care   Note: This dictation was prepared with Dragon dictation along with smaller phrase technology. Any transcriptional errors that result from this  process are unintentional.  Meghan [name] M.D on 03/06/2019 at 10:52 AM  Between 7am to 6pm - Pager - [phone]  After 6pm go to [email or web address]  [facility]  CC: Primary care physician; [name], MDPatient ID: Meghan [name], female   DOB: [birth year], 68 y.o.   MRN: [id number]

## 2019-03-06 NOTE — Consult Note (Signed)
Reason for Consult:stroke/ams Referring Physician: Dr. Posey Pronto   CC: AMS   HPI: Meghan [name] is an 68 y.o. female with h/o  hypertension, hyperlipidemia, Dm2, diabetic nephropathy, CKD stage 3, Pafib, CHF (EF 25%), CAD, carotid stenosis, anemia, apparently presents with c/o left sided weakness, and left facial droop starting at 5pm. Pt presented to ED for code stroke not a candidate for TPA due to INR 3.3 on anticoagulatio. Pt found to have small R cerebellar infarct.    Past Medical History:  Diagnosis Date  . Carotid arterial disease ([place])    a. 02/2012 s/p L CEA.  . CKD stage G3b/A1, GFR 30-44 and albumin creatinine ratio <30 mg/g   . Coronary artery disease    a. 02/2012 Lexi MV: Anterior, anteroseptal, septal, apical infarct with mild peri-infarct ischemia, EF 41%- medically managed due to anemia; b. 03/2017 Cath: LM nl, LAD 99ost/p/m - faint L->L collaterals, RI 20ost, LCX 77m, RCA 20p, 48m-->Med Rx for chronic subtotal LAD dzs. Avoid R Radial access in future.  . Diabetes mellitus without complication ([place])    Type II  . HFrEF (heart failure with reduced ejection fraction) ([place])    a. 06/2013 Echo: EF 35-40%; b. 07/2016 Echo: EF 25-30%; c. 11/2016 Echo: EF 20-25%; d. 12/2018 Echo: EF 25-30%, diff HK; e. 12/2018 Echo: EF 25-30%.  . Hyperlipidemia   . Hypertension   . Ischemic cardiomyopathy    a. 06/2013 Echo: EF 35-40%, glob HK; b. 07/2016 Echo: EF 25-30%, sev mid-apicalanteroseptal, ant, apical HK; c. 11/2016 Echo: EF 20-25%, antsept DK, Gr2 DD; d. 12/2018 Echo: EF 25-30%, diff HK.  . Morbid obesity ([place])   . Normocytic anemia   . Persistent atrial fibrillation ([facility])    a. CHA2DS2VASc = 7-->eliquis  . Stroke [facility]) 2013    Past Surgical History:  Procedure Laterality Date  . AMPUTATION TOE Left 09/22/2016   Procedure: AMPUTATION TOE;  Surgeon: Albertine Patricia, DPM;  Location: [facility] ORS;  Service: Podiatry;  Laterality: Left;  . CARDIOVERSION N/A 07/28/2016   Procedure: CARDIOVERSION;   Surgeon: Wellington Hampshire, MD;  Location: [facility] ORS;  Service: Cardiovascular;  Laterality: N/A;  . CARDIOVERSION N/A 08/07/2016   Procedure: CARDIOVERSION;  Surgeon: Wellington Hampshire, MD;  Location: [facility] ORS;  Service: Cardiovascular;  Laterality: N/A;  . CARDIOVERSION N/A 01/03/2019   Procedure: CARDIOVERSION;  Surgeon: Minna Merritts, MD;  Location: [facility] ORS;  Service: Cardiovascular;  Laterality: N/A;  . CAROTID ENDARTERECTOMY     [facility]; Dr. Lucky Cowboy  . CHOLECYSTECTOMY    . ESOPHAGOGASTRODUODENOSCOPY (EGD) WITH PROPOFOL N/A 01/25/2019   Procedure: ESOPHAGOGASTRODUODENOSCOPY (EGD) WITH PROPOFOL;  Surgeon: Virgel Manifold, MD;  Location: [facility] ENDOSCOPY;  Service: Endoscopy;  Laterality: N/A;  . foot infection Right   . LEG SURGERY     right;infection  . RIGHT/LEFT HEART CATH AND CORONARY ANGIOGRAPHY Bilateral 03/12/2017   Procedure: RIGHT/LEFT HEART CATH AND CORONARY ANGIOGRAPHY;  Surgeon: Wellington Hampshire, MD;  Location: [place] CV LAB;  Service: Cardiovascular;  Laterality: Bilateral;  . TONSILLECTOMY      Family History  Problem Relation Age of Onset  . Heart disease Mother   . Diabetes Mother   . Heart disease Father   . Hypertension Father   . Diabetes Maternal Grandmother     Social History:  reports that she has quit smoking. Her smoking use included cigarettes. She has a 2.50 pack-year smoking history. She has never used smokeless tobacco. She reports that she does not drink alcohol or use drugs.  Allergies  Allergen Reactions  . Spironolactone     Hyperkalemia     Medications: I have reviewed the patient's current medications.  ROS: Unable to obtain as sleeping   Physical Examination: Blood pressure 140/90, pulse 79, temperature 97.6 F (36.4 C), temperature source Oral, resp. rate 18, height 5\' 7"  (1.702 m), weight 119.7 kg, SpO2 97 %.  Unable to perform full exam as somnolent, not following commands. She does withdraw from painful stimuli bilaterally   L sided weaker then R   Laboratory Studies:   Basic Metabolic Panel: Recent Labs  Lab 03/02/19 0549 03/03/19 0552 03/04/19 0502 03/06/2019 1853 03/06/19 0215  NA 131* 133* 137 136 136  K 4.4 4.7 4.4 5.7* 5.2*  CL 98 102 105 103 105  CO2 22 20* 24 18* 21*  GLUCOSE 144* 169* 132* 183* 194*  BUN 96* 85* 76* 88* 91*  CREATININE 3.35* 2.64* 2.19* 3.59* 3.62*  CALCIUM 8.1* 8.3* 8.4* 8.7* 8.3*    Liver Function Tests: Recent Labs  Lab 02/28/19 1133 02/28/19 1630 03/04/2019 1853 03/06/19 0215  AST 27 26 38 37  ALT 17 17 21 22   ALKPHOS 111 122 107 103  BILITOT 1.6* 1.7* 2.7* 2.4*  PROT 8.4* 8.5* 8.6* 8.4*  ALBUMIN 2.8* 2.8* 2.7* 2.8*   Recent Labs  Lab 02/16/2019 1853 03/06/19 0215  LIPASE 1,320* 1,191*   No results for input(s): AMMONIA in the last 168 hours.  CBC: Recent Labs  Lab 02/28/19 1630 02/19/2019 1810 03/06/19 0215  WBC 6.8 13.0* 18.1*  NEUTROABS 5.1 11.2*  --   HGB 9.5* 9.9* 9.0*  HCT 30.0* 34.2* 29.3*  MCV 80.6 88.8 84.4  PLT 281 300 268    Cardiac Enzymes: No results for input(s): CKTOTAL, CKMB, CKMBINDEX, TROPONINI in the last 168 hours.  BNP: Invalid input(s): POCBNP  CBG: Recent Labs  Lab 03/03/19 2125 03/04/19 0759 03/04/19 1132 03/06/19 0511 03/06/19 0740  GLUCAP 200* 122* 225* 147* 158*    Microbiology: Results for orders placed or performed during the hospital encounter of 02/13/2019  SARS Coronavirus 2 by RT PCR (hospital order, performed in [facility] hospital lab) Nasopharyngeal Nasopharyngeal Swab     Status: None   Collection Time: 02/09/2019 12:20 AM   Specimen: Nasopharyngeal Swab  Result Value Ref Range Status   SARS Coronavirus 2 NEGATIVE NEGATIVE Final    Comment: (NOTE) SARS-CoV-2 target nucleic acids are NOT DETECTED. The SARS-CoV-2 RNA is generally detectable in upper and lower respiratory specimens during the acute phase of infection. The lowest concentration of SARS-CoV-2 viral copies this assay can detect is  250 copies / mL. A negative result does not preclude SARS-CoV-2 infection and should not be used as the sole basis for treatment or other patient management decisions.  A negative result may occur with improper specimen collection / handling, submission of specimen other than nasopharyngeal swab, presence of viral mutation(s) within the areas targeted by this assay, and inadequate number of viral copies (<250 copies / mL). A negative result must be combined with clinical observations, patient history, and epidemiological information. Fact Sheet for Patients:   StrictlyIdeas.no Fact Sheet for Healthcare Providers: BankingDealers.co.za This test is not yet approved or cleared  by the [place] FDA and has been authorized for detection and/or diagnosis of SARS-CoV-2 by FDA under an Emergency Use Authorization (EUA).  This EUA will remain in effect (meaning this test can be used) for the duration of the COVID-19 declaration under Section 564(b)(1) of the Act, 21 U.S.C.  section 360bbb-3(b)(1), unless the authorization is terminated or revoked sooner. Performed at [facility], [address] [place], [place] [address]   Blood culture (routine x 2)     Status: None (Preliminary result)   Collection Time: 02/11/2019  9:20 PM   Specimen: BLOOD  Result Value Ref Range Status   Specimen Description BLOOD RIGHT ANTECUBITAL  Final   Special Requests   Final    BOTTLES DRAWN AEROBIC AND ANAEROBIC Blood Culture adequate volume   Culture   Final    NO GROWTH < 12 HOURS Performed at [facility], [address], [place], [place] [address]    Report Status PENDING  Incomplete  Blood culture (routine x 2)     Status: None (Preliminary result)   Collection Time: 02/21/2019  9:20 PM   Specimen: BLOOD  Result Value Ref Range Status   Specimen Description BLOOD RIGHT HAND  Final   Special Requests   Final    BOTTLES DRAWN AEROBIC  AND ANAEROBIC Blood Culture adequate volume   Culture   Final    NO GROWTH < 12 HOURS Performed at [facility], [address] [place], [place] [address]    Report Status PENDING  Incomplete  SARS CORONAVIRUS 2 (TAT 6-24 HRS) Nasopharyngeal Nasopharyngeal Swab     Status: None   Collection Time: 02/17/2019  9:20 PM   Specimen: Nasopharyngeal Swab  Result Value Ref Range Status   SARS Coronavirus 2 NEGATIVE NEGATIVE Final    Comment: (NOTE) SARS-CoV-2 target nucleic acids are NOT DETECTED. The SARS-CoV-2 RNA is generally detectable in upper and lower respiratory specimens during the acute phase of infection. Negative results do not preclude SARS-CoV-2 infection, do not rule out co-infections with other pathogens, and should not be used as the sole basis for treatment or other patient management decisions. Negative results must be combined with clinical observations, patient history, and epidemiological information. The expected result is Negative. Fact Sheet for Patients: SugarRoll.be Fact Sheet for Healthcare Providers: https://www.woods-mathews.com/ This test is not yet approved or cleared by the [place] FDA and  has been authorized for detection and/or diagnosis of SARS-CoV-2 by FDA under an Emergency Use Authorization (EUA). This EUA will remain  in effect (meaning this test can be used) for the duration of the COVID-19 declaration under Section 56 4(b)(1) of the Act, 21 U.S.C. section 360bbb-3(b)(1), unless the authorization is terminated or revoked sooner. Performed at [facility], [place] [address], [place], [place] [address]   MRSA PCR Screening     Status: None   Collection Time: 03/06/19  8:57 AM   Specimen: Nasal Mucosa; Nasopharyngeal  Result Value Ref Range Status   MRSA by PCR NEGATIVE NEGATIVE Final    Comment:        The GeneXpert MRSA Assay (FDA approved for NASAL specimens only), is one component  of a comprehensive MRSA colonization surveillance program. It is not intended to diagnose MRSA infection nor to guide or monitor treatment for MRSA infections. Performed at [facility], [address] [place], [place] [address]     Coagulation Studies: Recent Labs    02/27/2019 1853  LABPROT 33.1*  INR 3.3*    Urinalysis:  Recent Labs  Lab 02/09/2019 2120  COLORURINE AMBER*  LABSPEC 1.019  PHURINE 5.0  GLUCOSEU NEGATIVE  HGBUR MODERATE*  BILIRUBINUR NEGATIVE  KETONESUR NEGATIVE  PROTEINUR 30*  NITRITE NEGATIVE  LEUKOCYTESUR MODERATE*    Lipid Panel:     Component Value  Date/Time   CHOL 51 03/06/2019 0215   CHOL 67 (L) 12/27/2016 0826   CHOL 165 02/14/2012 0410   TRIG 55 03/06/2019 0215   TRIG 168 02/14/2012 0410   HDL 13 (L) 03/06/2019 0215   HDL 23 (L) 12/27/2016 0826   HDL 31 (L) 02/14/2012 0410   CHOLHDL 3.9 03/06/2019 0215   VLDL 11 03/06/2019 0215   VLDL 34 02/14/2012 0410   LDLCALC 27 03/06/2019 0215   LDLCALC 35 12/27/2016 0826   LDLCALC 100 02/14/2012 0410    HgbA1C:  Lab Results  Component Value Date   HGBA1C 7.0 (H) 03/06/2019    Urine Drug Screen:      Component Value Date/Time   LABOPIA POSITIVE (A) 02/13/2019 2120   COCAINSCRNUR NONE DETECTED 02/25/2019 2120   LABBENZ NONE DETECTED 02/22/2019 2120   AMPHETMU NONE DETECTED 02/16/2019 2120   THCU NONE DETECTED 02/23/2019 2120   LABBARB NONE DETECTED 02/10/2019 2120    Alcohol Level:  Recent Labs  Lab 02/28/2019 1853  ETH <10    Imaging: Ct Abdomen Pelvis Wo Contrast  Result Date: 02/12/2019 CLINICAL DATA:  Nausea and vomiting. Technologist notes state discharged yesterday, continued decline. Patient is supposed to enter hospice but ?the did not have any beds ?Marland Kitchen Hypoxia and hypotensive. EXAM: CT ABDOMEN AND PELVIS WITHOUT CONTRAST TECHNIQUE: Multidetector CT imaging of the abdomen and pelvis was performed following the standard protocol without IV contrast. COMPARISON:   Abdominal CT 01/17/2019 FINDINGS: Lower chest: Cardiomegaly. Patchy bibasilar airspace disease, left greater than right. Small left pleural effusion. Hepatobiliary: No focal hepatic abnormality. Clips in the gallbladder fossa postcholecystectomy. No biliary dilatation. Pancreas: Progressive peripancreatic fat stranding from prior exam, now involving the entire pancreas. Small amount of peripancreatic free fluid. No organized peripancreatic collection. No ductal dilatation. Spleen: Normal in size without focal abnormality. Adrenals/Urinary Tract: No adrenal nodule. No hydronephrosis. Mild symmetric bilateral perinephric edema. Urinary bladder is physiologically distended. No bladder wall thickening. No urolithiasis. Stomach/Bowel: Nondistended stomach. Fluid within the duodenum with questionable wall thickening in the third and fourth portion. No bowel obstruction. Minor colonic diverticulosis without diverticulitis. Appendix not confidently visualized. No evidence of appendicitis. Vascular/Lymphatic: Aortic atherosclerosis. No aneurysm. Questionable prominence of the ovarian veins. Reproductive: Prominent sized left ovary, unchanged from prior. Quiescent right ovary. Uterus is unremarkable. Other: Small volume perihepatic ascites. Prominent body wall edema, slightly improved. Complex fat containing umbilical hernia. Musculoskeletal: Advanced degenerative change throughout spine. There are no acute or suspicious osseous abnormalities. IMPRESSION: 1. Peripancreatic fat stranding is increased since exam last month, suspicious for acute pancreatitis. Small amount of peripancreatic free fluid. No organized peripancreatic collection. 2. Fluid within the duodenum with questionable wall thickening in the third and fourth portion,, likely reactive. 3. Patchy bibasilar airspace disease, left greater than right, may be atelectasis or pneumonia. Small left pleural effusion. 4. Similar perihepatic ascites to prior exam. 5.  Additional chronic findings are stable from recent prior. Aortic Atherosclerosis (ICD10-I70.0). Electronically Signed   By: Keith Rake M.D.   On: 02/24/2019 21:15   Mr Brain Wo Contrast  Result Date: 03/06/2019 CLINICAL DATA:  Encephalopathy. Left facial droop EXAM: MRI HEAD WITHOUT CONTRAST TECHNIQUE: Multiplanar, multiecho pulse sequences of the brain and surrounding structures were obtained without intravenous contrast. COMPARISON:  Head CT 02/13/2019 FINDINGS: BRAIN: There is a punctate acute infarct of the right cerebellum. Early confluent hyperintense T2-weighted signal of the periventricular and deep white matter, most commonly due to chronic ischemic microangiopathy. There is an old left frontal lobe  infarct of the precentral gyrus. There is generalized atrophy without lobar predilection. The midline structures are normal. VASCULAR: The major intracranial arterial and venous sinus flow voids are normal. Susceptibility-sensitive sequences show no chronic microhemorrhage or superficial siderosis. SKULL AND UPPER CERVICAL SPINE: Calvarial bone marrow signal is normal. There is no skull base mass. The visualized upper cervical spine and soft tissues are normal. SINUSES/ORBITS: There are no fluid levels or advanced mucosal thickening. The mastoid air cells and middle ear cavities are free of fluid. The orbits are normal. IMPRESSION: 1. Punctate acute infarct of the right cerebellum. No hemorrhage or mass effect. 2. Old left frontal lobe infarct. 3. Moderate chronic small vessel disease. Electronically Signed   By: Ulyses Jarred M.D.   On: 03/06/2019 01:29   US Carotid Bilateral (at [facility] And Ap Only)  Result Date: 03/06/2019 CLINICAL DATA:  Altered mental status, hypertension, hyperlipidemia and diabetes. History of carotid endarterectomy. EXAM: BILATERAL CAROTID DUPLEX ULTRASOUND TECHNIQUE: Pearline Cables scale imaging, color Doppler and duplex ultrasound were performed of bilateral carotid and vertebral  arteries in the neck. COMPARISON:  02/14/2012 FINDINGS: Criteria: Quantification of carotid stenosis is based on velocity parameters that correlate the residual internal carotid diameter with NASCET-based stenosis levels, using the diameter of the distal internal carotid lumen as the denominator for stenosis measurement. The following velocity measurements were obtained: RIGHT ICA:  55/22 cm/sec CCA:  [id number] cm/sec SYSTOLIC ICA/CCA RATIO:  0.8 ECA:  72 cm/sec LEFT ICA:  64/35 cm/sec CCA:  [id number] cm/sec SYSTOLIC ICA/CCA RATIO:  1.2 ECA:  79 cm/sec RIGHT CAROTID ARTERY: There is a mild amount of partially calcified plaque at the level of the carotid bulb and proximal right ICA. Estimated right ICA stenosis is less than 50%. RIGHT VERTEBRAL ARTERY: Antegrade flow with normal waveform and velocity. LEFT CAROTID ARTERY: Patulous left carotid bulb after prior endarterectomy. No evidence of focal plaque. No evidence of left ICA stenosis. LEFT VERTEBRAL ARTERY: Antegrade flow with normal waveform and velocity. IMPRESSION: 1. Mild plaque at the level of the right carotid bulb and proximal right ICA with estimated right ICA stenosis of less than 50%. 2. Post left carotid endarterectomy without evidence of recurrent plaque or stenosis at the left carotid bifurcation. Electronically Signed   By: Aletta Edouard M.D.   On: 03/06/2019 11:01   Dg Chest Port 1 View  Result Date: 03/06/2019 CLINICAL DATA:  Altered mental status EXAM: PORTABLE CHEST 1 VIEW COMPARISON:  Radiograph 01/25/2019 FINDINGS: Widespread hazy interstitial opacities throughout the lungs with indistinctness of the pulmonary vascularity, central cuffing, and cardiomegaly. Suspect small left effusion with some septal thickening. No pneumothorax. No acute osseous or soft tissue abnormality. Degenerative changes are present in the imaged spine and shoulders. More advanced degenerative changes noted in the right shoulder compared to left. IMPRESSION: Features of  CHF with left effusion, interstitial edema and cardiomegaly. More confluent opacities could reflect developing alveolar edema though infection could have a similar appearance. Electronically Signed   By: Lovena Le M.D.   On: 03/06/2019 00:03   Ct Head Code Stroke Wo Contrast  Result Date: 02/19/2019 CLINICAL DATA:  Code stroke. Additional history provided: Patient discharged yesterday with continued decline at home. Hypotensive and hypoxic, response to verbal stimuli Izora Gala or some questions appropriately. EXAM: CT HEAD WITHOUT CONTRAST TECHNIQUE: Contiguous axial images were obtained from the base of the skull through the vertex without intravenous contrast. COMPARISON:  Head CT 02/13/2012 FINDINGS: Brain: The examination is mildly motion degraded. No evidence of acute intracranial  hemorrhage. No acute demarcated cortical infarction. No evidence of intracranial mass. No midline shift or extra-axial fluid collection. Redemonstrated predominantly white matter infarct within the high frontal lobe minimally involving the cortex. Additional ill-defined hypoattenuation within the cerebral white matter is nonspecific, but consistent with chronic small vessel ischemic disease. Mild generalized parenchymal atrophy. Vascular: No hyperdense vessel.  Atherosclerotic calcifications. Skull: Normal. Negative for fracture or focal lesion. Sinuses/Orbits: No significant paranasal sinus disease or mastoid effusion. These results were called by telephone at the time of interpretation on 02/27/2019 at 6:55 pm to provider [facility] , who verbally acknowledged these results. IMPRESSION: 1. Motion degraded examination. 2. No acute intracranial hemorrhage or acute demarcated cortical infarction. 3. Redemonstrated chronic high left frontal lobe infarct predominantly involving the white matter. 4. Background of mild generalized parenchymal atrophy and chronic small vessel ischemic disease. Electronically Signed   By: Kellie Simmering DO   On: 02/28/2019 18:55     Assessment/Plan:  68 y.o. female with h/o  hypertension, hyperlipidemia, Dm2, diabetic nephropathy, CKD stage 3, Pafib, CHF (EF 25%), CAD, carotid stenosis, anemia, apparently presents with c/o left sided weakness, and left facial droop starting at 5pm. Pt presented to ED for code stroke not a candidate for TPA due to INR 3.3 on anticoagulatio. Pt found to have small R cerebellar infarct.    - Stroke likely in setting of A-fib but that does not explain her mental status completely.  - Discussion with daughter, family would like to proceed with comfort measures.  - No further imaging from neurological standpoint at this time - Call if any questions please.   03/06/2019, 12:22 PM

## 2019-03-06 NOTE — Progress Notes (Signed)
    BRIEF OVERNIGHT PROGRESS REPORT   SUBJECTIVE: Review abnormal MRI brain  OBJECTIVE: Patient examined at the bedside. She was afebrile with blood pressure 111/65mm Hg and pulse rate 82 beats/min.   Focused Neurological Exam Lethargic, right gaze preference, mild left face droop, left arm drift, bilateral leg weakness. Bilateral arm asterixis. Mild to Moderate dysarthria. Oriented x 2.  1A: Level of Consciousness - Arouses to minor stimulation + 1 1B: Ask Month and Age - Both Questions Right + 0 1C: Blink Eyes & Squeeze Hands - Performs Both Tasks + 0 2: Test Horizontal Extraocular Movements - Partial Gaze Palsy: Can Be Overcome + 1 3: Test Visual Fields - Partial Hemianopia + 1 4: Test Facial Palsy (Use Grimace if Obtunded) - Partial paralysis (lower face) + 2 5A: Test Left Arm Motor Drift - Drift, but doesn't hit bed + 1 5B: Test Right Arm Motor Drift - No Drift for 10 Seconds + 0 6A: Test Left Leg Motor Drift - No Effort Against Gravity + 3 6B: Test Right Leg Motor Drift - Some Effort Against Gravity + 2 7: Test Limb Ataxia (FNF/Heel-Shin) - No Ataxia + 0 8: Test Sensation - Normal; No sensory loss + 0 9: Test Language/Aphasia - Mild-Moderate Aphasia: Some Obvious Changes, Without Significant Limitation + 1 10: Test Dysarthria - Mild-Moderate Dysarthria: Slurring but can be understood + 1 11: Test Extinction/Inattention - Visual/tactile/auditory/spatial/personal inattention + 1  NIHSS Score: 14  ASSESSMENT: 68 y.o. female with pertinent past medical history of chronic systolic heart failure CAD, persistent atrial fibrillation s/p cardioversion on Eliquis, Diabetes mellitus, chronic anemia, GI Bleed, CKD, hyperlipidemia, CAD s/p left carotid endarterectomy with previous CVA, and hypertension presenting with   PLAN: 1. Acute right cerebellum infarct - Patient presenting with left sided weakness and left facial droop.  not deemed candidate for tPA thrombolytics due to INR 3.3 -  Admitted to Telemetry unit - CT head reviewed and shows no acute intracranial abnormality  - Follow up MRI  of the brain shows Punctate acute infarct of the right cerebellum. - Check HgbA1c, fasting lipid panel - PT consult, OT consult, Speech consult - Echocardiogram pending - Patient was on Eliquis prior to event,. Will continue with Eliquis - NPO until RN stroke swallow screen -Telemetry monitoring - Frequent neuro checks - Neurology consult placed. Message sent via Haiku to Dr. Irish Elders   Discussed findings of MRI brain with daughter who is currently at the bedside. Verbalized understanding no further questions.   Rufina Falco, DNP, CCRN, FNP-C [facility] Nurse Practitioner Between 7pm to 7am - Pager [phone]  After 7am go to [email or web address] - [email or web address]  [facility] [employer]  [phone]

## 2019-03-07 DIAGNOSIS — R410 Disorientation, unspecified: Secondary | ICD-10-CM

## 2019-03-07 MED ORDER — HYDROMORPHONE HCL 1 MG/ML IJ SOLN
1.0000 mg | INTRAMUSCULAR | Status: DC | PRN
  Administered 2019-03-07 – 2019-03-09 (×7): 1 mg via INTRAVENOUS
  Filled 2019-03-07: qty 1
  Filled 2019-03-07: qty 2
  Filled 2019-03-07: qty 1
  Filled 2019-03-07 (×2): qty 2
  Filled 2019-03-07 (×2): qty 1

## 2019-03-07 NOTE — Evaluation (Signed)
{  SLP Cancellation Note  Patient Details Name: Meghan [name] MRN: [id number] DOB: 12-20-1950   Cancelled treatment:       Reason Eval/Treat Not Completed: Medical issues which prohibited therapy   NSG requested ST not see this patient as she is moving to comfort care. NSG requested orders DC from MD. ST will sign off and will re assess if necessary.  [name] 03/07/2019, 9:36 AM

## 2019-03-07 NOTE — Care Management Important Message (Signed)
Important Message  Patient Details  Name: [name] MRN: [id number] Date of Birth: 30-Oct-1950   Medicare Important Message Given:  Yes     Dannette Barbara 03/07/2019, 2:02 PM

## 2019-03-07 NOTE — Progress Notes (Signed)
OT Cancellation Note  Patient Details Name: Meghan [name] MRN: [id number] DOB: 1951/04/09   Cancelled Treatment:    Reason Eval/Treat Not Completed: Other (comment) Order received for OT evaluation and chart reviewed which indicated Comfort Care now.  Spoke to Dr Posey Pronto regarding therapy status and no need for OT or PT evaluations due to [place] in place now.  Will update PT as well.    Chrys Racer, OTR/L, NTMTC ascom [phone] 03/07/19, 9:32 AM

## 2019-03-07 NOTE — TOC Initial Note (Signed)
Transition of Care [facility]) - Initial/Assessment Note    Patient Details  Name: Meghan [name] MRN: [id number] Date of Birth: July 29, 1950  Transition of Care [facility]) CM/SW Contact:    Ross Ludwig, LCSW Phone Number: 03/07/2019, 5:36 PM  Clinical Narrative:                  Patient is a 68 year old female who is alert and oriented x4.  Patient was not awake during assessment, CSW spoke to patient's daughters who were at bedside.  Patient's family have decided they would like patient to go to the hospice facility for end of life care.  CSW spoke to family and presented choice, they chose [facility].  CSW contacted [place] [facility] referral, hospice home does not have any beds available at this time.  CSW updated patient's family, they are aware that patient does not have a bed available.  CSW to continue to follow patient's progress throughout discharge planning.  Expected Discharge Plan: [place] Barriers to Discharge: Continued Medical Work up, Hospice Bed not available   Patient Goals and CMS Choice Patient states their goals for this hospitalization and ongoing recovery are:: To go to Hospice facility for end of life care. CMS Medicare.gov Compare Post Acute Care list provided to:: Patient Represenative (must comment) Choice offered to / list presented to : Adult Children  Expected Discharge Plan and Services Expected Discharge Plan: [place]     Post Acute Care Choice: Hospice Living arrangements for the past 2 months: Single Family Home                                      Prior Living Arrangements/Services Living arrangements for the past 2 months: Single Family Home Lives with:: Adult Children Patient language and need for interpreter reviewed:: Yes Do you feel safe going back to the place where you live?: No   Patient's family feel that she need end of life care at the hospice facility.  Need for  Family Participation in Patient Care: Yes (Comment) Care giver support system in place?: No (comment)   Criminal Activity/Legal Involvement Pertinent to Current Situation/Hospitalization: No - Comment as needed  Activities of Daily Living Home Assistive Devices/Equipment: Wheelchair, Bedside commode/3-in-1, Shower chair with back ADL Screening (condition at time of admission) Patient's cognitive ability adequate to safely complete daily activities?: No(infarct) Is the patient deaf or have difficulty hearing?: No Does the patient have difficulty seeing, even when wearing glasses/contacts?: No Does the patient have difficulty concentrating, remembering, or making decisions?: No Patient able to express need for assistance with ADLs?: No(infarct) Does the patient have difficulty dressing or bathing?: Yes Independently performs ADLs?: No Communication: Independent Dressing (OT): Needs assistance Is this a change from baseline?: Pre-admission baseline Grooming: Needs assistance Is this a change from baseline?: Pre-admission baseline Feeding: Needs assistance Is this a change from baseline?: Change from baseline, expected to last >3 days Bathing: Needs assistance Is this a change from baseline?: Change from baseline, expected to last >3 days Toileting: Needs assistance Is this a change from baseline?: Pre-admission baseline In/Out Bed: Needs assistance Is this a change from baseline?: Pre-admission baseline Walks in Home: Needs assistance, Independent with device (comment) Is this a change from baseline?: Pre-admission baseline Does the patient have difficulty walking or climbing stairs?: Yes Weakness of Legs: Both Weakness of Arms/Hands: Both  Permission Sought/Granted Permission sought to share information with : Facility Sport and exercise psychologist, Family Supports Permission granted to share information with : Yes, Verbal Permission Granted  Share Information with NAME: Earley Abide  Daughter [phone] or Cornelius Moras Daughter [phone]  Permission granted to share info w AGENCY: Hospice agency        Emotional Assessment Appearance:: Appears stated age     Orientation: : Oriented to Self, Oriented to Place, Oriented to  Time, Oriented to Situation Alcohol / Substance Use: Not Applicable Psych Involvement: No (comment)  Admission diagnosis:  Dehydration [E86.0] Elevated troponin [R77.8] Left-sided weakness [R53.1] Generalized weakness [R53.1] AKI (acute kidney injury) ([place]) [N17.9] Pneumonia due to infectious organism, unspecified laterality, unspecified part of lung [J18.9] Acute pancreatitis, unspecified complication status, unspecified pancreatitis type [K85.90] AMS (altered mental status) [R41.82] Weakness [R53.1] Patient Active Problem List   Diagnosis Date Noted  . Acute cerebrovascular accident (CVA) ([place])   . AKI (acute kidney injury) ([place])   . Elevated troponin   . Pancreatitis 02/16/2019  . Left-sided weakness 02/28/2019  . Weakness   . Acute kidney injury superimposed on CKD ([place]) 02/28/2019  . Procedure not carried out because of contraindication   . Melena   . Dilated cardiomyopathy ([place]) 12/15/2018  . Hypoglycemia due to type 2 diabetes mellitus ([place]) 12/15/2018  . Acute on chronic combined systolic and diastolic CHF (congestive heart failure) ([place]) 12/13/2018  . Sepsis ([place]) 03/30/2017  . Elevated TSH 12/29/2016  . Psoriasis 12/26/2016  . Obesity, Class III, BMI 40-49.9 (morbid obesity) ([place]) 12/26/2016  . Toe osteomyelitis, left ([place]) 09/21/2016  . CKD (chronic kidney disease), stage III 07/26/2016  . History of CVA (cerebrovascular accident) without residual deficits 09/03/2013  . Hyperlipidemia 09/03/2013  . Type 2 diabetes mellitus with stage 4 chronic kidney disease ([place]) 09/03/2013  . Hyperlipidemia due to type 2 diabetes mellitus ([place]) 09/03/2013  . Persistent atrial fibrillation 07/16/2013  . Paroxysmal atrial  fibrillation ([place]) 07/16/2013  . Ischemic cardiomyopathy 07/16/2013  . Hypertension 07/16/2013  . Vomiting 06/29/2013  . Chronic combined systolic and diastolic HF (heart failure), NYHA class 4 ([place]) 04/12/2012  . Coronary atherosclerosis   . Dyspnea 03/01/2012   PCP:  [name], MD Pharmacy:   [facility] [address] [place] [address] Phone: [phone] Fax: [phone]     Social Determinants of Health (SDOH) Interventions    Readmission Risk Interventions Readmission Risk Prevention Plan 12/14/2018  Transportation Screening Complete  PCP or Specialist Appt within 5-7 Days Complete  Home Care Screening Complete  Medication Review (RN CM) Complete  Some recent data might be hidden

## 2019-03-07 NOTE — Plan of Care (Signed)
Patient now comfort care. Alert at times but has confusion

## 2019-03-07 NOTE — Progress Notes (Signed)
[place] [facility] [place] NAME: Meghan [name]    MR#:  [id number]  DATE OF BIRTH:  1950-11-16  SUBJECTIVE:  patient came in with left sided weakness, epigastric /political abdominal pain and severe dry heaving [name] is in the room. Patient is being fed some [place] by dter. Did not talk much--appears calm  REVIEW OF SYSTEMS:   Review of Systems  Unable to perform ROS: Mental acuity   Tolerating Diet:clears Tolerating PT: bedbound  DRUG ALLERGIES:   Allergies  Allergen Reactions  . Spironolactone     Hyperkalemia     VITALS:  Blood pressure 123/76, pulse 75, temperature (!) 97.4 F (36.3 C), temperature source Oral, resp. rate (!) 28, height 5\' 7"  (1.702 m), weight 119.7 kg, SpO2 94 %.  PHYSICAL EXAMINATION:   Physical Exam limited --pt comfort care  GENERAL:  68 y.o.-year-old patient lying in the bed with no acute distress  Appears chronically and  critically ill morbid obesity LUNGS: shallow breath sounds bilaterally, no wheezing, rales, rhonchi. No use of accessory muscles of respiration. No respiratory distress CARDIOVASCULAR: S1, S2 normal. No murmurs, rubs, or gallops.  ABDOMEN: Soft, diffuse generalized tenderness, nondistended. PSYCHIATRIC:  Patient awake with intermittent moaning SKIN: per RN  LABORATORY PANEL:  CBC Recent Labs  Lab 03/06/19 0215  WBC 18.1*  HGB 9.0*  HCT 29.3*  PLT 268    Chemistries  Recent Labs  Lab 03/06/19 0215  NA 136  K 5.2*  CL 105  CO2 21*  GLUCOSE 194*  BUN 91*  CREATININE 3.62*  CALCIUM 8.3*  AST 37  ALT 22  ALKPHOS 103  BILITOT 2.4*   Cardiac Enzymes No results for input(s): TROPONINI in the last 168 hours. RADIOLOGY:  Ct Abdomen Pelvis Wo Contrast  Result Date: 03/10/2019 CLINICAL DATA:  Nausea and vomiting. Technologist notes state discharged yesterday, continued decline. Patient is supposed to enter hospice but ?the did not have any beds ?Marland Kitchen Hypoxia and  hypotensive. EXAM: CT ABDOMEN AND PELVIS WITHOUT CONTRAST TECHNIQUE: Multidetector CT imaging of the abdomen and pelvis was performed following the standard protocol without IV contrast. COMPARISON:  Abdominal CT 01/17/2019 FINDINGS: Lower chest: Cardiomegaly. Patchy bibasilar airspace disease, left greater than right. Small left pleural effusion. Hepatobiliary: No focal hepatic abnormality. Clips in the gallbladder fossa postcholecystectomy. No biliary dilatation. Pancreas: Progressive peripancreatic fat stranding from prior exam, now involving the entire pancreas. Small amount of peripancreatic free fluid. No organized peripancreatic collection. No ductal dilatation. Spleen: Normal in size without focal abnormality. Adrenals/Urinary Tract: No adrenal nodule. No hydronephrosis. Mild symmetric bilateral perinephric edema. Urinary bladder is physiologically distended. No bladder wall thickening. No urolithiasis. Stomach/Bowel: Nondistended stomach. Fluid within the duodenum with questionable wall thickening in the third and fourth portion. No bowel obstruction. Minor colonic diverticulosis without diverticulitis. Appendix not confidently visualized. No evidence of appendicitis. Vascular/Lymphatic: Aortic atherosclerosis. No aneurysm. Questionable prominence of the ovarian veins. Reproductive: Prominent sized left ovary, unchanged from prior. Quiescent right ovary. Uterus is unremarkable. Other: Small volume perihepatic ascites. Prominent body wall edema, slightly improved. Complex fat containing umbilical hernia. Musculoskeletal: Advanced degenerative change throughout spine. There are no acute or suspicious osseous abnormalities. IMPRESSION: 1. Peripancreatic fat stranding is increased since exam last month, suspicious for acute pancreatitis. Small amount of peripancreatic free fluid. No organized peripancreatic collection. 2. Fluid within the duodenum with questionable wall thickening in the third and fourth  portion,, likely reactive. 3. Patchy bibasilar airspace disease, left greater than right,  may be atelectasis or pneumonia. Small left pleural effusion. 4. Similar perihepatic ascites to prior exam. 5. Additional chronic findings are stable from recent prior. Aortic Atherosclerosis (ICD10-I70.0). Electronically Signed   By: Keith Rake M.D.   On: 02/19/2019 21:15   Mr Brain Wo Contrast  Result Date: 03/06/2019 CLINICAL DATA:  Encephalopathy. Left facial droop EXAM: MRI HEAD WITHOUT CONTRAST TECHNIQUE: Multiplanar, multiecho pulse sequences of the brain and surrounding structures were obtained without intravenous contrast. COMPARISON:  Head CT 02/20/2019 FINDINGS: BRAIN: There is a punctate acute infarct of the right cerebellum. Early confluent hyperintense T2-weighted signal of the periventricular and deep white matter, most commonly due to chronic ischemic microangiopathy. There is an old left frontal lobe infarct of the precentral gyrus. There is generalized atrophy without lobar predilection. The midline structures are normal. VASCULAR: The major intracranial arterial and venous sinus flow voids are normal. Susceptibility-sensitive sequences show no chronic microhemorrhage or superficial siderosis. SKULL AND UPPER CERVICAL SPINE: Calvarial bone marrow signal is normal. There is no skull base mass. The visualized upper cervical spine and soft tissues are normal. SINUSES/ORBITS: There are no fluid levels or advanced mucosal thickening. The mastoid air cells and middle ear cavities are free of fluid. The orbits are normal. IMPRESSION: 1. Punctate acute infarct of the right cerebellum. No hemorrhage or mass effect. 2. Old left frontal lobe infarct. 3. Moderate chronic small vessel disease. Electronically Signed   By: Ulyses Jarred M.D.   On: 03/06/2019 01:29   US Carotid Bilateral (at [facility] And [facility] Only)  Result Date: 03/06/2019 CLINICAL DATA:  Altered mental status, hypertension, hyperlipidemia and  diabetes. History of carotid endarterectomy. EXAM: BILATERAL CAROTID DUPLEX ULTRASOUND TECHNIQUE: Pearline Cables scale imaging, color Doppler and duplex ultrasound were performed of bilateral carotid and vertebral arteries in the neck. COMPARISON:  02/14/2012 FINDINGS: Criteria: Quantification of carotid stenosis is based on velocity parameters that correlate the residual internal carotid diameter with NASCET-based stenosis levels, using the diameter of the distal internal carotid lumen as the denominator for stenosis measurement. The following velocity measurements were obtained: RIGHT ICA:  55/22 cm/sec CCA:  [id number] cm/sec SYSTOLIC ICA/CCA RATIO:  0.8 ECA:  72 cm/sec LEFT ICA:  64/35 cm/sec CCA:  [id number] cm/sec SYSTOLIC ICA/CCA RATIO:  1.2 ECA:  79 cm/sec RIGHT CAROTID ARTERY: There is a mild amount of partially calcified plaque at the level of the carotid bulb and proximal right ICA. Estimated right ICA stenosis is less than 50%. RIGHT VERTEBRAL ARTERY: Antegrade flow with normal waveform and velocity. LEFT CAROTID ARTERY: Patulous left carotid bulb after prior endarterectomy. No evidence of focal plaque. No evidence of left ICA stenosis. LEFT VERTEBRAL ARTERY: Antegrade flow with normal waveform and velocity. IMPRESSION: 1. Mild plaque at the level of the right carotid bulb and proximal right ICA with estimated right ICA stenosis of less than 50%. 2. Post left carotid endarterectomy without evidence of recurrent plaque or stenosis at the left carotid bifurcation. Electronically Signed   By: Aletta Edouard M.D.   On: 03/06/2019 11:01   Dg Chest Port 1 View  Result Date: 03/06/2019 CLINICAL DATA:  Altered mental status EXAM: PORTABLE CHEST 1 VIEW COMPARISON:  Radiograph 01/25/2019 FINDINGS: Widespread hazy interstitial opacities throughout the lungs with indistinctness of the pulmonary vascularity, central cuffing, and cardiomegaly. Suspect small left effusion with some septal thickening. No pneumothorax. No acute  osseous or soft tissue abnormality. Degenerative changes are present in the imaged spine and shoulders. More advanced degenerative changes noted in the right shoulder compared  to left. IMPRESSION: Features of CHF with left effusion, interstitial edema and cardiomegaly. More confluent opacities could reflect developing alveolar edema though infection could have a similar appearance. Electronically Signed   By: Lovena Le M.D.   On: 03/06/2019 00:03   Ct Head Code Stroke Wo Contrast  Result Date: 03/02/2019 CLINICAL DATA:  Code stroke. Additional history provided: Patient discharged yesterday with continued decline at home. Hypotensive and hypoxic, response to verbal stimuli Izora Gala or some questions appropriately. EXAM: CT HEAD WITHOUT CONTRAST TECHNIQUE: Contiguous axial images were obtained from the base of the skull through the vertex without intravenous contrast. COMPARISON:  Head CT 02/13/2012 FINDINGS: Brain: The examination is mildly motion degraded. No evidence of acute intracranial hemorrhage. No acute demarcated cortical infarction. No evidence of intracranial mass. No midline shift or extra-axial fluid collection. Redemonstrated predominantly white matter infarct within the high frontal lobe minimally involving the cortex. Additional ill-defined hypoattenuation within the cerebral white matter is nonspecific, but consistent with chronic small vessel ischemic disease. Mild generalized parenchymal atrophy. Vascular: No hyperdense vessel.  Atherosclerotic calcifications. Skull: Normal. Negative for fracture or focal lesion. Sinuses/Orbits: No significant paranasal sinus disease or mastoid effusion. These results were called by telephone at the time of interpretation on 03/02/2019 at 6:55 pm to provider [facility] , who verbally acknowledged these results. IMPRESSION: 1. Motion degraded examination. 2. No acute intracranial hemorrhage or acute demarcated cortical infarction. 3. Redemonstrated chronic  high left frontal lobe infarct predominantly involving the white matter. 4. Background of mild generalized parenchymal atrophy and chronic small vessel ischemic disease. Electronically Signed   By: Kellie Simmering DO   On: 03/07/2019 18:55   ASSESSMENT AND PLAN:    Novia Kania  is a 68 y.o. female,  with h/o  hypertension, hyperlipidemia, Dm2, diabetic nephropathy, CKD stage 3, Pafib, CHF (EF 25%), CAD, carotid stenosis, anemia, apparently presents with c/o left sided weakness, and left facial droop starting at 5pm.  Pt presented to ED for code stroke not a candidate for TPA due to INR 3.3. It also had significant abdominal pain and dry heaving with vomiting.  1. Acute right cerebellar stroke with history of chronic atrial fibrillation on anticoagulation -patient presented with left sided weakness and facial droop as noted by family. -Workup in the ER showed MRI positive for right cerebellar stroke -patient was already on eliquis  2. Acute on chronic kidney disease stage IV with diabetic nephropathy and ATN in the setting of acute pancreatitis -baseline creatinine 2.1 -patient came in with creatinine of 3.59 intermittent hypotension  3. Acute pancreatitis-moderate to severe with peri- pancreatic stranding -patient is having significant abdominal pain and dry heaving and moaning not able to stay comfortable -IV dilaudid mg every two hours as needed along with IV Ativan and PRN Zofran for dry heaves and vomiting  4. Hyperkalemia due to acute on chronic kidney disease -received calcium gluconate, dextrose, bicarb in the ER -potassium is 4.2  5. Acute on chronic congestive heart failure systolic with severe cardiomyopathy EF of 25% per echo -BNP 704 -x-ray shows pulmonary edema -continue oxygen -PRN dilaudid for comfort  6. Lactic acidosis suspected due to ATN, pancreatitis  7. Type II diabetes with diabetic nephropathy  Given multiple comorbidities and presentation of stroke, pancreatitis,  renal failure and congestive heart failure patient has a very poor/grave prognosis.  Meghan [name] and her younger sister Meghan [name] and they want to keep patient comfortable and requesting comfort care. They also request patient when able to be transferred to hospice  facility. CSW to see for Hospice home  Social worker informed about hospice home.   Family communication : Meghan [name] in the room Consults : nephrology Discharge Disposition : hospice facility CODE STATUS: DNR DVT Prophylaxis : none due to patient being comfort care  TOTAL TIME TAKING CARE OF THIS PATIENT: *25* minutes.  >50% time spent on counselling and coordination of care   Note: This dictation was prepared with Dragon dictation along with smaller phrase technology. Any transcriptional errors that result from this process are unintentional.  Fritzi Mandes M.D on 03/07/2019 at 12:23 PM  Between 7am to 6pm - Pager - [phone]  After 6pm go to [email or web address]  [facility]   CC: Primary care physician; [name], MDPatient ID: Meghan [name], female   DOB: 09-03-1950, 68 y.o.   MRN: [id number]

## 2019-03-07 NOTE — Progress Notes (Addendum)
New referral for [facility] home received from [employer].  Writer met in the room with patient and her daughter [name] to initiate education regarding hospice services philosophy, team approach to care and current visitation policy with understanding voiced. Patient appears lethargic, but able to take some sips of orange drink and ice cubes from a spoon. Aspiration precautions reinforced with [name]. Hospital care team and family are aware that there is currently no bed available for transfer. Writer to follow up with patient and family on Monday. Hospital care team updated. Patient information faxed to referral. Flo Shanks BSN, RN, [facility] Liaison [phone]

## 2019-03-08 DIAGNOSIS — R531 Weakness: Secondary | ICD-10-CM | POA: Diagnosis not present

## 2019-03-08 NOTE — Progress Notes (Signed)
Meghan [name] at [place] NAME: Meghan [name]    MR#:  [id number]  DATE OF BIRTH:  1951-04-04  SUBJECTIVE:  patient came in with left sided weakness, epigastric abdominal pain and severe dry heaving  [name] and Meghan [name] is in the room. Patient is being  Drinking some Sunkist given by dter. Did not talk much--appears calm  REVIEW OF SYSTEMS:   Review of Systems  Unable to perform ROS: Mental acuity   Tolerating Diet:clears Tolerating PT: bedbound  DRUG ALLERGIES:   Allergies  Allergen Reactions  . Spironolactone     Hyperkalemia     VITALS:  Blood pressure 130/85, pulse (!) 131, temperature 98.6 F (37 C), temperature source Oral, resp. rate 19, height 5\' 7"  (1.702 m), weight 119.7 kg, SpO2 (!) 89 %.  PHYSICAL EXAMINATION:   Physical Exam limited --pt comfort care  GENERAL:  68 y.o.-year-old patient lying in the bed with no acute distress  Appears chronically and  critically ill morbid obesity LUNGS: shallow breath sounds bilaterally, no wheezing, rales, rhonchi. No use of accessory muscles of respiration. No respiratory distress CARDIOVASCULAR: S1, S2 normal. No murmurs, rubs, or gallops.  ABDOMEN: Soft, diffuse generalized tenderness, nondistended. PSYCHIATRIC:  Patient awake with intermittent moaning SKIN: per RN  LABORATORY PANEL:  CBC Recent Labs  Lab 03/06/19 0215  WBC 18.1*  HGB 9.0*  HCT 29.3*  PLT 268    Chemistries  Recent Labs  Lab 03/06/19 0215  NA 136  K 5.2*  CL 105  CO2 21*  GLUCOSE 194*  BUN 91*  CREATININE 3.62*  CALCIUM 8.3*  AST 37  ALT 22  ALKPHOS 103  BILITOT 2.4*   Cardiac Enzymes No results for input(s): TROPONINI in the last 168 hours. RADIOLOGY:  No results found. ASSESSMENT AND PLAN:    Meghan [name]  is a 68 y.o. female,  with h/o  hypertension, hyperlipidemia, Dm2, diabetic nephropathy, CKD stage 3, Pafib, CHF (EF 25%), CAD, carotid stenosis, anemia, apparently presents with  c/o left sided weakness, and left facial droop starting at 5pm.  Pt presented to ED for code stroke not a candidate for TPA due to INR 3.3. It also had significant abdominal pain and dry heaving with vomiting.  1. Acute right cerebellar stroke with history of chronic atrial fibrillation on anticoagulation -patient presented with left sided weakness and facial droop as noted by family. -Workup in the ER showed MRI positive for right cerebellar stroke -patient was already on eliquis prior to admission  2. Acute on chronic kidney disease stage IV with diabetic nephropathy and ATN in the setting of acute pancreatitis -baseline creatinine 2.1 -patient came in with creatinine of 3.59 intermittent hypotension  3. Acute pancreatitis-moderate to severe with peri- pancreatic stranding -patient is having significant abdominal pain and dry heaving and moaning not able to stay comfortable -IV dilaudid mg every two hours as needed along with IV Ativan and PRN Zofran for dry heaves and vomiting  4. Hyperkalemia due to acute on chronic kidney disease -received calcium gluconate, dextrose, bicarb in the ER -potassium is 4.2  5. Acute on chronic congestive heart failure systolic with severe cardiomyopathy EF of 25% per echo -BNP 704 -x-ray shows pulmonary edema -continue oxygen -PRN dilaudid for comfort  6. Lactic acidosis suspected due to ATN, pancreatitis  7. Type II diabetes with diabetic nephropathy  Given multiple comorbidities and presentation of stroke, pancreatitis, renal failure and congestive heart failure patient has a very poor/grave prognosis.  Meghan [name] and  sister Meghan [name]  want to keep patient comfortable. Continue  comfort care. They also request patient when able to be transferred to hospice facility. CSW to see for Hospice home  Social worker informed about hospice home--no beds available currently.   Family communication : Meghan [name] in the room Consults : nephrology Discharge  Disposition : hospice facility CODE STATUS: DNR DVT Prophylaxis : none due to patient being comfort care  TOTAL TIME TAKING CARE OF THIS PATIENT: *25* minutes.  >50% time spent on counselling and coordination of care   Note: This dictation was prepared with Dragon dictation along with smaller phrase technology. Any transcriptional errors that result from this process are unintentional.  Fritzi Mandes M.D on 03/08/2019 at 12:53 PM  Between 7am to 6pm - Pager - [phone]  After 6pm go to [email or web address]  [facility]   CC: Primary care physician; Meghan [name], MDPatient ID: Meghan [name], female   DOB: 1951/01/01, 68 y.o.   MRN: [id number]

## 2019-03-08 NOTE — Plan of Care (Signed)
  Problem: Activity: Goal: Risk for activity intolerance will decrease Outcome: Not Met (add Reason)   Problem: Nutrition: Goal: Adequate nutrition will be maintained Outcome: Not Met (add Reason)   Problem: Coping: Goal: Level of anxiety will decrease Outcome: Not Met (add Reason)   Problem: Elimination: Goal: Will not experience complications related to urinary retention Outcome: Not Met (add Reason)  Patient is comfort care

## 2019-03-09 ENCOUNTER — Encounter: Payer: Self-pay | Admitting: Internal Medicine

## 2019-03-09 DIAGNOSIS — N179 Acute kidney failure, unspecified: Secondary | ICD-10-CM

## 2019-03-09 DIAGNOSIS — K859 Acute pancreatitis without necrosis or infection, unspecified: Secondary | ICD-10-CM | POA: Diagnosis not present

## 2019-03-10 LAB — CULTURE, BLOOD (ROUTINE X 2)
Culture: NO GROWTH
Culture: NO GROWTH
Special Requests: ADEQUATE
Special Requests: ADEQUATE

## 2019-03-11 NOTE — Death Summary Note (Signed)
Death Summary      Adeline Petitfrere [id number] DOB: [birth year] DOA: 03/29/2019  PCP: [name], MD    Admit date: 03/29/2019 Date of Death: 2019/04/02   Final Diagnoses:   Principal Problem:   Acute cerebrovascular accident (CVA) ([place]) Active Problems:   Paroxysmal atrial fibrillation ([place])   Hypertension   CKD (chronic kidney disease), stage III   Type 2 diabetes mellitus with stage 4 chronic kidney disease ([facility])   Weakness   Pancreatitis   Left-sided weakness   AKI (acute kidney injury) ([place])   Elevated troponin   Cause of death: Acute stroke   Hospital Course:   Ms. Velador is a 68 year old woman with medical history significant for hypertension, hyperlipidemia, type 2 diabetes mellitus, CKD stage IV, atrial fibrillation, chronic systolic CHF with EF of 00%, CAD, carotid stenosis, anemia.  She was brought to the hospital because of left-sided weakness and left facial droop.  She was found to have acute right cerebellar stroke, acute pancreatitis, and acute kidney injury.  Unfortunately, her condition deteriorated and her family requested comfort measures with hospice.  I met her eldest daughter, [name], at the bedside and plan of care was discussed.  She expired on 04-02-19 at 11:05 AM.     Time of death: 11:05 am  Signed:  Jennye Boroughs  [facility] Apr 02, 2019, 11:58 AM   Total time spent on discharge 40 minutes

## 2019-03-11 NOTE — Progress Notes (Signed)
Pt began agonal breathing around 0930. Dilaudid given for comfort and pt repositioned. Family at bedside. Pt passed away at 1105.

## 2019-03-11 DEATH — deceased

## 2019-03-13 DIAGNOSIS — R4182 Altered mental status, unspecified: Secondary | ICD-10-CM

## 2019-03-14 ENCOUNTER — Ambulatory Visit: Payer: Medicare Other | Admitting: Nurse Practitioner

## 2020-05-06 IMAGING — CT CT HEAD CODE STROKE
3 series · 14 of 47 positions shown, 16 images · non-contrast
Comparison: Head CT 02/13/2012

CLINICAL DATA: Code stroke. Additional history provided: Patient
discharged yesterday with continued decline at home. Hypotensive and
hypoxic, response to verbal stimuli [place] or some questions
appropriately.

EXAM:
CT HEAD WITHOUT CONTRAST
TECHNIQUE: Contiguous axial images were obtained from the base of the skull
through the vertex without intravenous contrast.

[Series 3: head wo · axial · 0.41mm/px · z∈[+335,+460]mm · 8 of 30 slices shown, 10 images]
[im 3/30  brain]
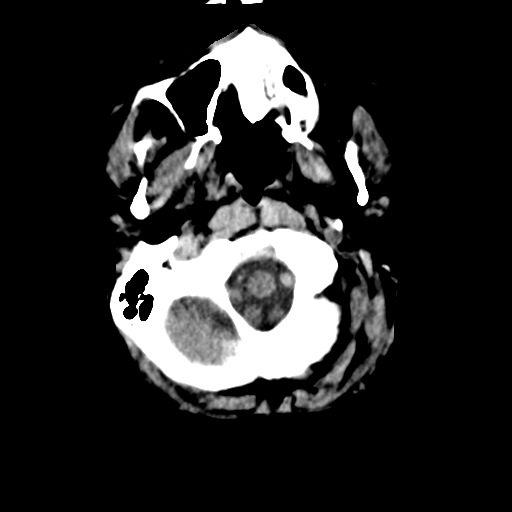
[im 3/30  bone]
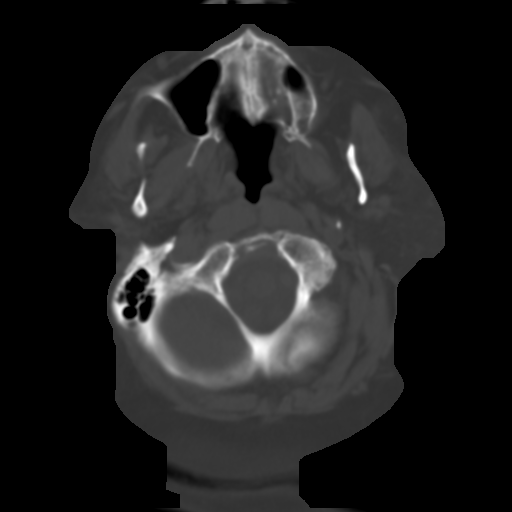
[im 7/30  brain]
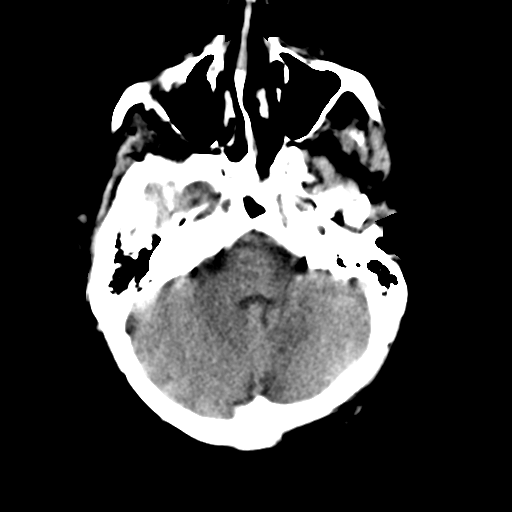
[im 10/30  brain]
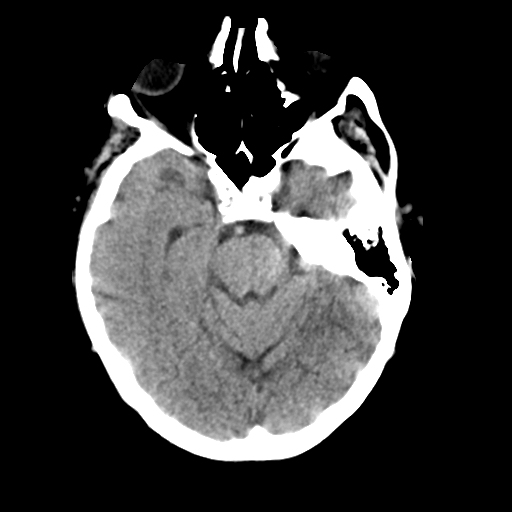
[im 14/30  brain]
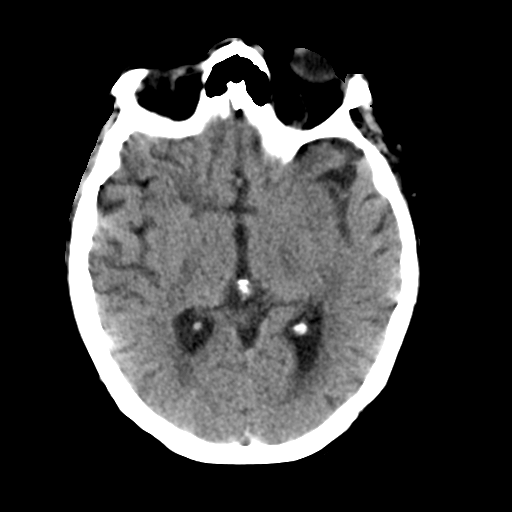
[im 17/30  brain]
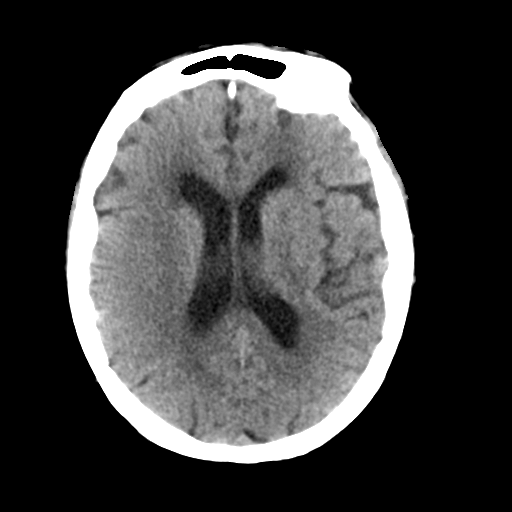
[im 17/30  bone]
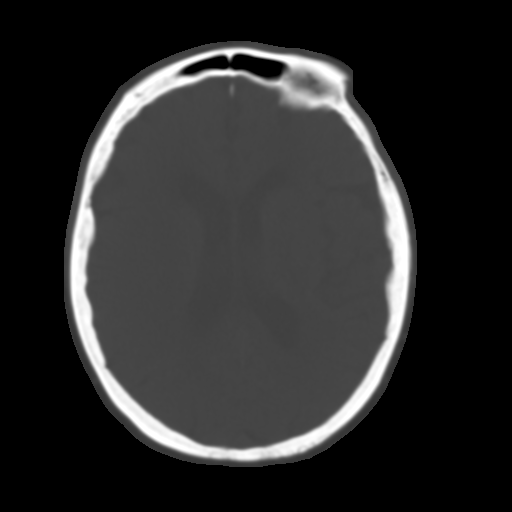
[im 21/30  brain]
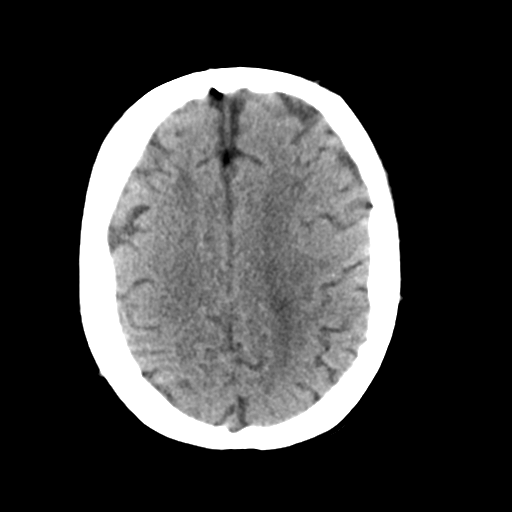
[im 24/30  brain]
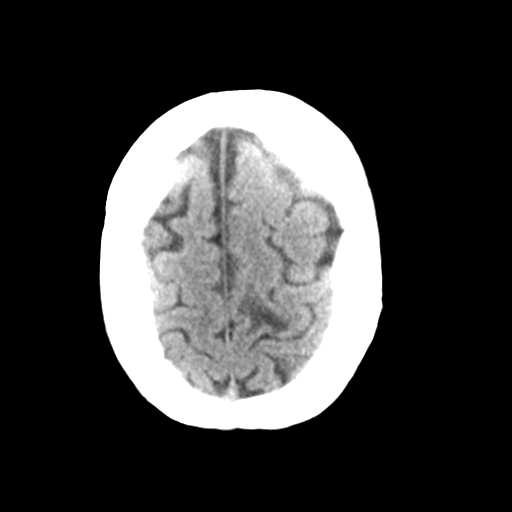
[im 28/30  brain]
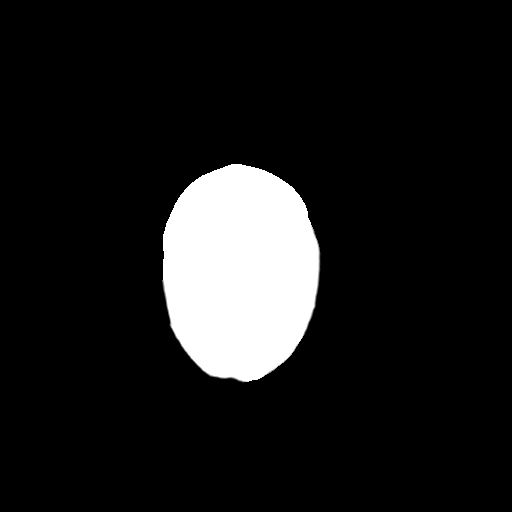

[Series 4: coronal soft tissue · coronal · 0.28mm/px · 3 of 62 slices shown]
[im 21/62  brain]
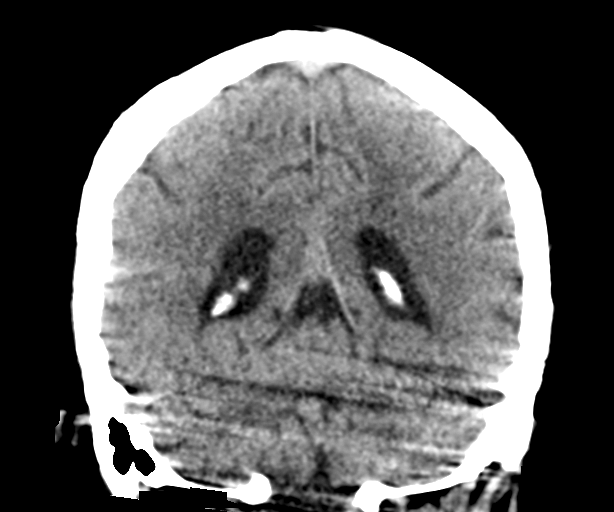
[im 28/62  brain]
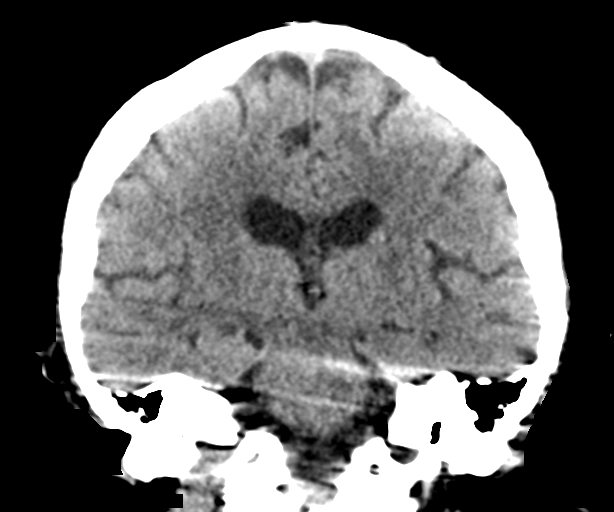
[im 34/62  brain]
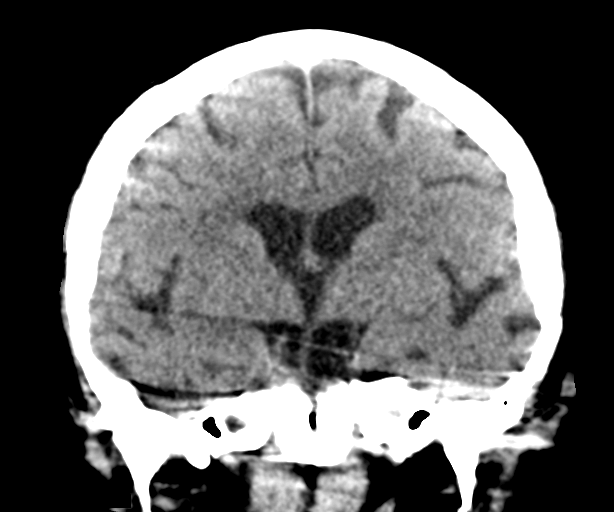

[Series 5: sagittal soft tissue · sagittal · 0.29mm/px · 3 of 49 slices shown]
[im 17/49  brain]
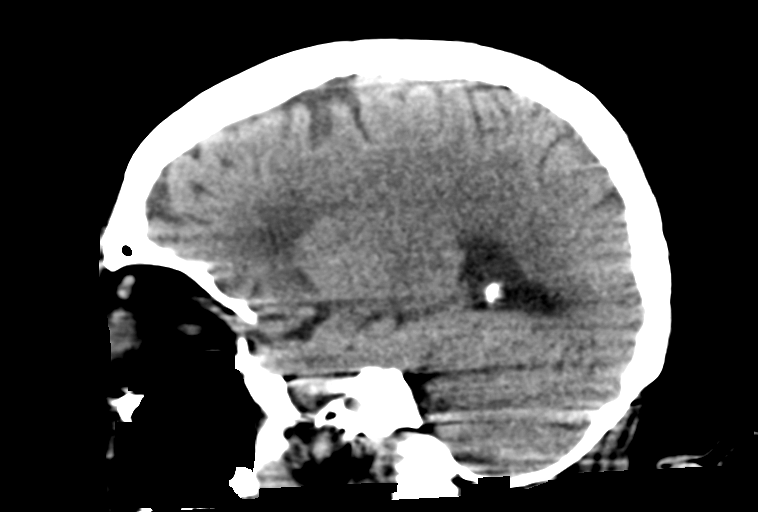
[im 25/49  brain]
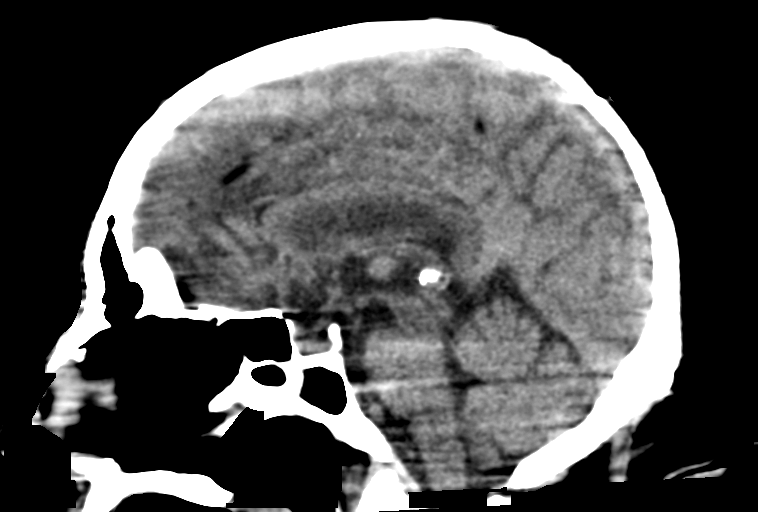
[im 33/49  brain]
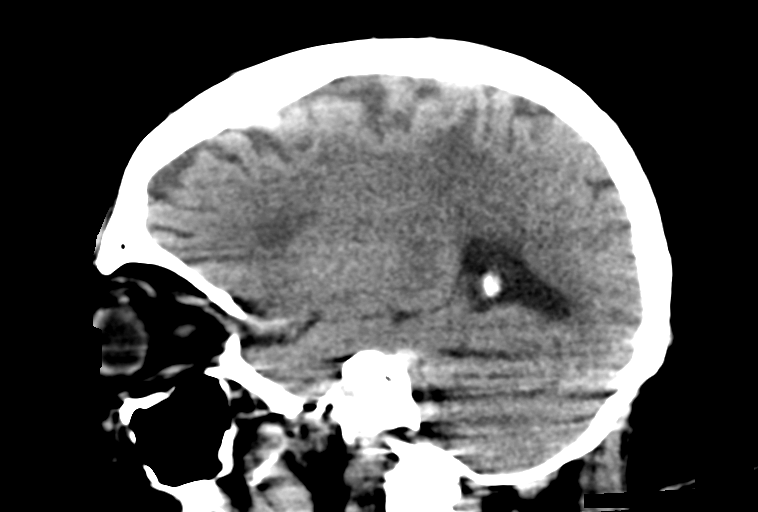

[14 of 47 positions shown; findings below may reference images not displayed]

FINDINGS: Brain:

The examination is mildly motion degraded.

No evidence of acute intracranial hemorrhage.

No acute demarcated cortical infarction.

No evidence of intracranial mass.

No midline shift or extra-axial fluid collection.

Redemonstrated predominantly white matter infarct within the high
frontal lobe minimally involving the cortex. Additional ill-defined
hypoattenuation within the cerebral white matter is nonspecific, but
consistent with chronic small vessel ischemic disease.

Mild generalized parenchymal atrophy.

Vascular: No hyperdense vessel.  Atherosclerotic calcifications.

Skull: Normal. Negative for fracture or focal lesion.

Sinuses/Orbits: No significant paranasal sinus disease or mastoid
effusion.

These results were called by telephone at the time of interpretation
on 03/05/2019 at [DATE] to provider TABUCHI HOLI , who verbally
acknowledged these results.
IMPRESSION: 1. Motion degraded examination.
2. No acute intracranial hemorrhage or acute demarcated cortical
infarction.
3. Redemonstrated chronic high left frontal lobe infarct
predominantly involving the white matter.
4. Background of mild generalized parenchymal atrophy and chronic
small vessel ischemic disease.

## 2020-05-06 IMAGING — DX DG CHEST 1V PORT
1 series · 1 of 1 positions shown · non-contrast
Comparison: Radiograph 01/25/2019

CLINICAL DATA: Altered mental status

EXAM:
PORTABLE CHEST 1 VIEW

[chest ap]
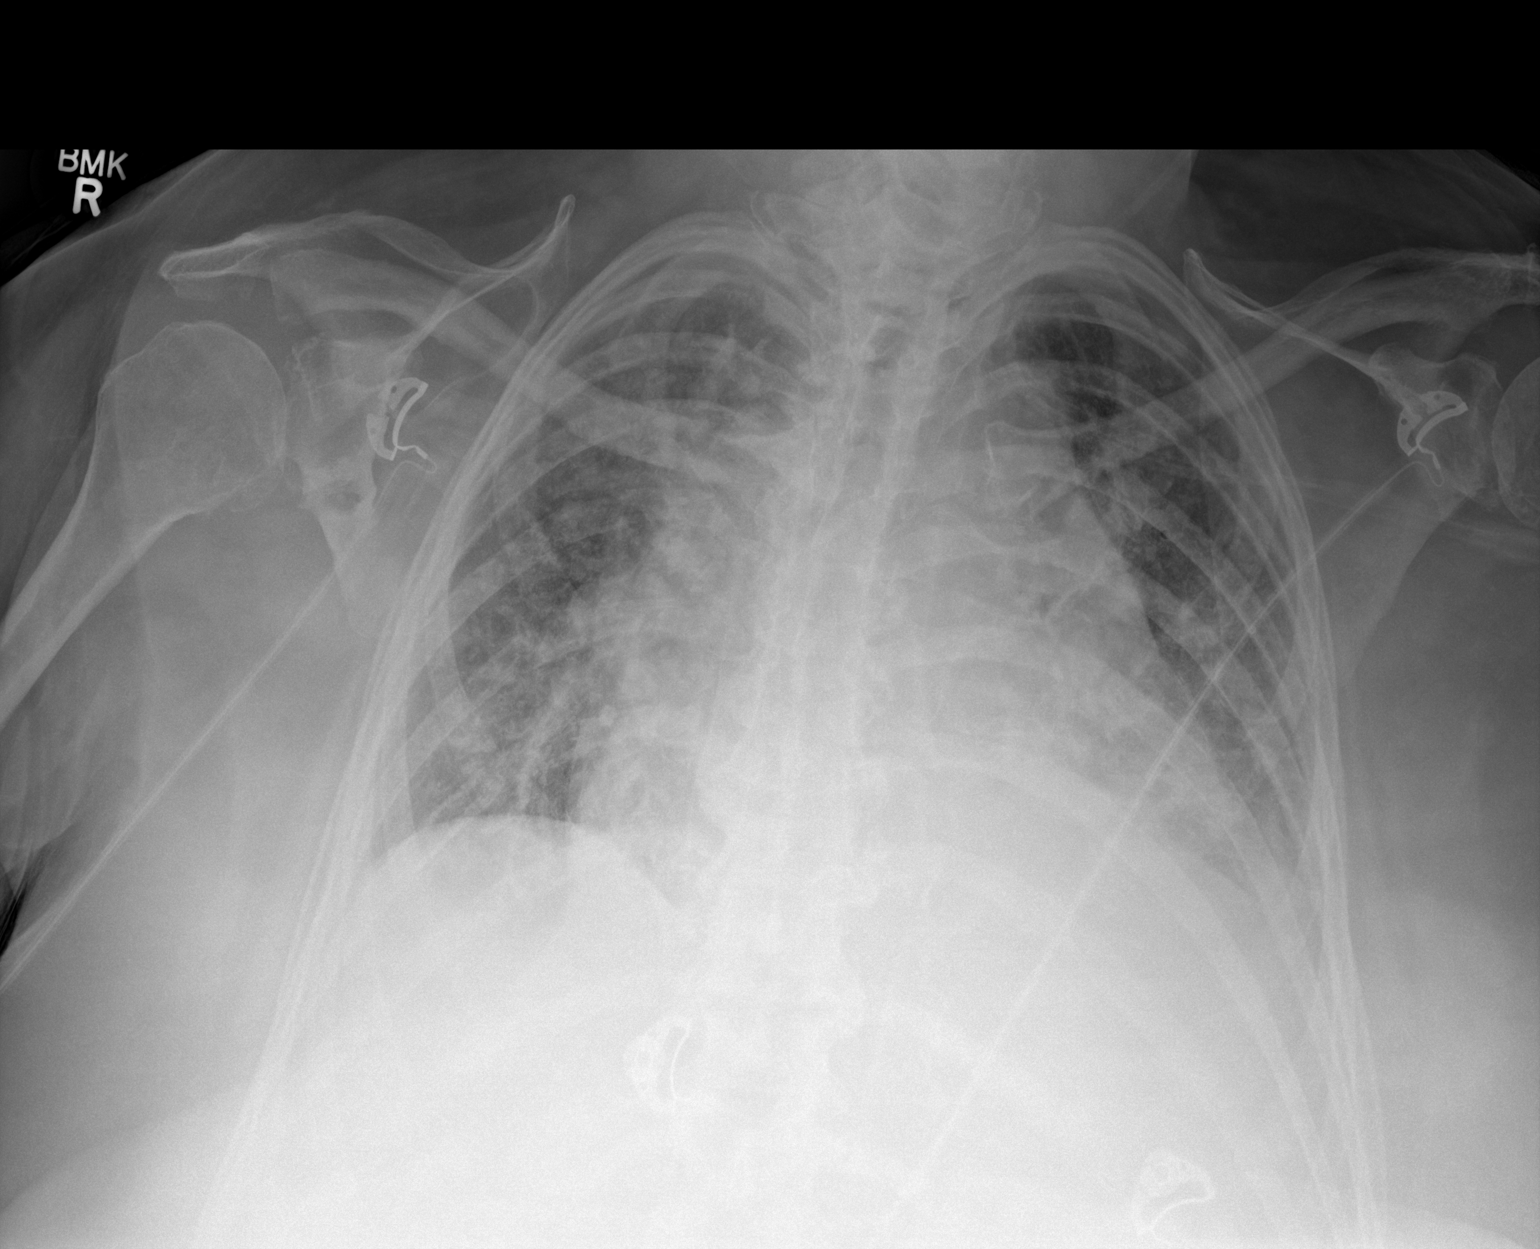

[1 of 1 positions shown; findings below may reference images not displayed]

FINDINGS: Widespread hazy interstitial opacities throughout the lungs with
indistinctness of the pulmonary vascularity, central cuffing, and
cardiomegaly. Suspect small left effusion with some septal
thickening. No pneumothorax. No acute osseous or soft tissue
abnormality. Degenerative changes are present in the imaged spine
and shoulders. More advanced degenerative changes noted in the right
shoulder compared to left.
IMPRESSION: Features of CHF with left effusion, interstitial edema and
cardiomegaly. More confluent opacities could reflect developing
alveolar edema though infection could have a similar appearance.
